# Patient Record
Sex: Male | Born: 1955
Health system: Southern US, Community
[De-identification: ages and names within clinical notes are randomized; demographics above are authoritative.]

## PROBLEM LIST (undated history)

## (undated) DIAGNOSIS — K922 Gastrointestinal hemorrhage, unspecified: Secondary | ICD-10-CM

## (undated) DIAGNOSIS — I2510 Atherosclerotic heart disease of native coronary artery without angina pectoris: Secondary | ICD-10-CM

## (undated) DIAGNOSIS — K635 Polyp of colon: Secondary | ICD-10-CM

## (undated) DIAGNOSIS — F32A Depression, unspecified: Secondary | ICD-10-CM

## (undated) DIAGNOSIS — R0600 Dyspnea, unspecified: Secondary | ICD-10-CM

## (undated) DIAGNOSIS — E785 Hyperlipidemia, unspecified: Secondary | ICD-10-CM

## (undated) DIAGNOSIS — I442 Atrioventricular block, complete: Secondary | ICD-10-CM

## (undated) DIAGNOSIS — M5030 Other cervical disc degeneration, unspecified cervical region: Secondary | ICD-10-CM

## (undated) DIAGNOSIS — J189 Pneumonia, unspecified organism: Secondary | ICD-10-CM

## (undated) DIAGNOSIS — T8859XA Other complications of anesthesia, initial encounter: Secondary | ICD-10-CM

## (undated) DIAGNOSIS — Z950 Presence of cardiac pacemaker: Secondary | ICD-10-CM

## (undated) DIAGNOSIS — F419 Anxiety disorder, unspecified: Secondary | ICD-10-CM

## (undated) DIAGNOSIS — M1990 Unspecified osteoarthritis, unspecified site: Secondary | ICD-10-CM

## (undated) DIAGNOSIS — I10 Essential (primary) hypertension: Secondary | ICD-10-CM

## (undated) DIAGNOSIS — K219 Gastro-esophageal reflux disease without esophagitis: Secondary | ICD-10-CM

## (undated) DIAGNOSIS — N419 Inflammatory disease of prostate, unspecified: Secondary | ICD-10-CM

## (undated) DIAGNOSIS — R011 Cardiac murmur, unspecified: Secondary | ICD-10-CM

## (undated) DIAGNOSIS — J302 Other seasonal allergic rhinitis: Secondary | ICD-10-CM

## (undated) DIAGNOSIS — R002 Palpitations: Secondary | ICD-10-CM

## (undated) DIAGNOSIS — E119 Type 2 diabetes mellitus without complications: Secondary | ICD-10-CM

## (undated) DIAGNOSIS — F988 Other specified behavioral and emotional disorders with onset usually occurring in childhood and adolescence: Secondary | ICD-10-CM

## (undated) DIAGNOSIS — T7840XA Allergy, unspecified, initial encounter: Secondary | ICD-10-CM

## (undated) DIAGNOSIS — I209 Angina pectoris, unspecified: Secondary | ICD-10-CM

## (undated) DIAGNOSIS — G4730 Sleep apnea, unspecified: Secondary | ICD-10-CM

## (undated) HISTORY — DX: Unspecified osteoarthritis, unspecified site: M19.90

## (undated) HISTORY — PX: HERNIA REPAIR: SHX51

## (undated) HISTORY — DX: Other cervical disc degeneration, unspecified cervical region: M50.30

## (undated) HISTORY — DX: Inflammatory disease of prostate, unspecified: N41.9

## (undated) HISTORY — PX: SPINE SURGERY: SHX786

## (undated) HISTORY — DX: Palpitations: R00.2

## (undated) HISTORY — DX: Polyp of colon: K63.5

## (undated) HISTORY — DX: Anxiety disorder, unspecified: F41.9

## (undated) HISTORY — PX: UPPER GASTROINTESTINAL ENDOSCOPY: SHX188

## (undated) HISTORY — DX: Presence of cardiac pacemaker: Z95.0

## (undated) HISTORY — DX: Sleep apnea, unspecified: G47.30

## (undated) HISTORY — PX: POLYPECTOMY: SHX149

## (undated) HISTORY — DX: Hyperlipidemia, unspecified: E78.5

## (undated) HISTORY — DX: Depression, unspecified: F32.A

## (undated) HISTORY — DX: Other specified behavioral and emotional disorders with onset usually occurring in childhood and adolescence: F98.8

## (undated) HISTORY — DX: Cardiac murmur, unspecified: R01.1

## (undated) HISTORY — DX: Gastro-esophageal reflux disease without esophagitis: K21.9

## (undated) HISTORY — PX: KNEE SURGERY: SHX244

## (undated) HISTORY — DX: Allergy, unspecified, initial encounter: T78.40XA

## (undated) HISTORY — PX: COLONOSCOPY: SHX174

## (undated) HISTORY — PX: BACK SURGERY: SHX140

## (undated) HISTORY — PX: ESOPHAGOGASTRODUODENOSCOPY: SHX1529

## (undated) HISTORY — DX: Type 2 diabetes mellitus without complications: E11.9

## (undated) HISTORY — PX: ANTERIOR CERVICAL DECOMP/DISCECTOMY FUSION: SHX1161

## (undated) HISTORY — DX: Atrioventricular block, complete: I44.2

## (undated) HISTORY — DX: Pneumonia, unspecified organism: J18.9

## (undated) HISTORY — DX: Other seasonal allergic rhinitis: J30.2

---

## 2000-04-17 HISTORY — PX: ANTERIOR CERVICAL DECOMP/DISCECTOMY FUSION: SHX1161

## 2000-07-10 ENCOUNTER — Encounter: Payer: Self-pay | Admitting: Neurosurgery

## 2000-07-12 ENCOUNTER — Ambulatory Visit (HOSPITAL_COMMUNITY): Admission: RE | Admit: 2000-07-12 | Discharge: 2000-07-13 | Payer: Self-pay | Admitting: Neurosurgery

## 2000-07-12 ENCOUNTER — Encounter: Payer: Self-pay | Admitting: Neurosurgery

## 2000-09-12 ENCOUNTER — Ambulatory Visit (HOSPITAL_COMMUNITY): Admission: RE | Admit: 2000-09-12 | Discharge: 2000-09-12 | Payer: Self-pay | Admitting: Neurosurgery

## 2000-09-12 ENCOUNTER — Encounter: Payer: Self-pay | Admitting: Neurosurgery

## 2001-01-02 ENCOUNTER — Encounter: Payer: Self-pay | Admitting: Neurosurgery

## 2001-01-02 ENCOUNTER — Ambulatory Visit (HOSPITAL_COMMUNITY): Admission: RE | Admit: 2001-01-02 | Discharge: 2001-01-02 | Payer: Self-pay | Admitting: Neurosurgery

## 2001-04-17 DIAGNOSIS — R002 Palpitations: Secondary | ICD-10-CM

## 2001-04-17 HISTORY — DX: Palpitations: R00.2

## 2001-05-14 ENCOUNTER — Ambulatory Visit (HOSPITAL_COMMUNITY): Admission: RE | Admit: 2001-05-14 | Discharge: 2001-05-14 | Payer: Self-pay | Admitting: Family Medicine

## 2001-11-08 ENCOUNTER — Ambulatory Visit (HOSPITAL_COMMUNITY): Admission: RE | Admit: 2001-11-08 | Discharge: 2001-11-08 | Payer: Self-pay | Admitting: Family Medicine

## 2001-11-08 ENCOUNTER — Encounter: Payer: Self-pay | Admitting: Family Medicine

## 2002-11-12 ENCOUNTER — Ambulatory Visit (HOSPITAL_COMMUNITY): Admission: RE | Admit: 2002-11-12 | Discharge: 2002-11-12 | Payer: Self-pay | Admitting: Family Medicine

## 2002-11-12 ENCOUNTER — Encounter: Payer: Self-pay | Admitting: Family Medicine

## 2003-04-17 ENCOUNTER — Emergency Department (HOSPITAL_COMMUNITY): Admission: AD | Admit: 2003-04-17 | Discharge: 2003-04-17 | Payer: Self-pay

## 2003-06-22 ENCOUNTER — Ambulatory Visit (HOSPITAL_COMMUNITY): Admission: RE | Admit: 2003-06-22 | Discharge: 2003-06-22 | Payer: Self-pay | Admitting: Family Medicine

## 2003-11-16 ENCOUNTER — Encounter (HOSPITAL_COMMUNITY): Admission: RE | Admit: 2003-11-16 | Discharge: 2003-11-17 | Payer: Self-pay | Admitting: Family Medicine

## 2005-06-30 ENCOUNTER — Ambulatory Visit (HOSPITAL_COMMUNITY): Admission: RE | Admit: 2005-06-30 | Discharge: 2005-06-30 | Payer: Self-pay | Admitting: Family Medicine

## 2005-07-10 ENCOUNTER — Ambulatory Visit: Payer: Self-pay | Admitting: Orthopedic Surgery

## 2005-07-13 ENCOUNTER — Ambulatory Visit (HOSPITAL_COMMUNITY): Admission: RE | Admit: 2005-07-13 | Discharge: 2005-07-13 | Payer: Self-pay | Admitting: Orthopedic Surgery

## 2005-08-16 ENCOUNTER — Ambulatory Visit: Payer: Self-pay | Admitting: Orthopedic Surgery

## 2006-02-15 ENCOUNTER — Ambulatory Visit (HOSPITAL_COMMUNITY): Admission: RE | Admit: 2006-02-15 | Discharge: 2006-02-15 | Payer: Self-pay | Admitting: Family Medicine

## 2006-03-13 ENCOUNTER — Ambulatory Visit: Payer: Self-pay | Admitting: Gastroenterology

## 2006-11-04 ENCOUNTER — Emergency Department (HOSPITAL_COMMUNITY): Admission: EM | Admit: 2006-11-04 | Discharge: 2006-11-04 | Payer: Self-pay | Admitting: Emergency Medicine

## 2006-11-07 ENCOUNTER — Ambulatory Visit: Payer: Self-pay | Admitting: Cardiovascular Disease

## 2006-11-09 ENCOUNTER — Ambulatory Visit: Payer: Self-pay | Admitting: Internal Medicine

## 2006-11-09 ENCOUNTER — Encounter (HOSPITAL_COMMUNITY): Admission: RE | Admit: 2006-11-09 | Discharge: 2006-12-09 | Payer: Self-pay | Admitting: Cardiovascular Disease

## 2006-12-26 ENCOUNTER — Ambulatory Visit: Payer: Self-pay | Admitting: Cardiovascular Disease

## 2007-02-27 ENCOUNTER — Ambulatory Visit: Payer: Self-pay | Admitting: Cardiovascular Disease

## 2007-03-12 ENCOUNTER — Ambulatory Visit (HOSPITAL_COMMUNITY): Admission: RE | Admit: 2007-03-12 | Discharge: 2007-03-12 | Payer: Self-pay | Admitting: Family Medicine

## 2008-09-24 ENCOUNTER — Ambulatory Visit (HOSPITAL_COMMUNITY): Admission: RE | Admit: 2008-09-24 | Discharge: 2008-09-24 | Payer: Self-pay | Admitting: Family Medicine

## 2008-11-08 ENCOUNTER — Encounter: Payer: Self-pay | Admitting: Internal Medicine

## 2008-11-24 ENCOUNTER — Encounter (INDEPENDENT_AMBULATORY_CARE_PROVIDER_SITE_OTHER): Payer: Self-pay | Admitting: *Deleted

## 2009-04-17 DIAGNOSIS — K635 Polyp of colon: Secondary | ICD-10-CM

## 2009-04-17 HISTORY — PX: COLONOSCOPY W/ POLYPECTOMY: SHX1380

## 2009-04-17 HISTORY — DX: Polyp of colon: K63.5

## 2009-05-10 ENCOUNTER — Telehealth: Payer: Self-pay | Admitting: Gastroenterology

## 2009-05-31 ENCOUNTER — Encounter (INDEPENDENT_AMBULATORY_CARE_PROVIDER_SITE_OTHER): Payer: Self-pay | Admitting: *Deleted

## 2009-06-30 ENCOUNTER — Encounter (INDEPENDENT_AMBULATORY_CARE_PROVIDER_SITE_OTHER): Payer: Self-pay | Admitting: *Deleted

## 2009-07-01 ENCOUNTER — Ambulatory Visit: Payer: Self-pay | Admitting: Gastroenterology

## 2009-07-13 ENCOUNTER — Telehealth (INDEPENDENT_AMBULATORY_CARE_PROVIDER_SITE_OTHER): Payer: Self-pay | Admitting: *Deleted

## 2009-07-15 ENCOUNTER — Ambulatory Visit: Payer: Self-pay | Admitting: Gastroenterology

## 2009-07-16 ENCOUNTER — Encounter: Payer: Self-pay | Admitting: Gastroenterology

## 2010-05-07 ENCOUNTER — Encounter: Payer: Self-pay | Admitting: Family Medicine

## 2010-05-08 ENCOUNTER — Encounter: Payer: Self-pay | Admitting: Family Medicine

## 2010-05-17 NOTE — Letter (Signed)
Summary: Previsit letter  [facility]  [address] [place], [place] [address]   Phone: [phone]  Fax: [phone]       05/31/2009 MRN: [id number]  [name] [address] [place], [place]  [address]  Dear Mr. SCHNORR,  Welcome to the Gastroenterology Division at [facility].    You are scheduled to see a nurse for your pre-procedure visit on 07-01-09 at 2:00p.m. on the 3rd floor at [facility], [address]  We ask that you try to arrive at our office 15 minutes prior to your appointment time to allow for check-in.  Your nurse visit will consist of discussing your medical and surgical history, your immediate family medical history, and your medications.    Please bring a complete list of all your medications or, if you prefer, bring the medication bottles and we will list them.  We will need to be aware of both prescribed and over the counter drugs.  We will need to know exact dosage information as well.  If you are on blood thinners (Coumadin, Plavix, Aggrenox, Ticlid, etc.) please call our office today/prior to your appointment, as we need to consult with your physician about holding your medication.   Please be prepared to read and sign documents such as consent forms, a financial agreement, and acknowledgement forms.  If necessary, and with your consent, a friend or relative is welcome to sit-in on the nurse visit with you.  Please bring your insurance card so that we may make a copy of it.  If your insurance requires a referral to see a specialist, please bring your referral form from your primary care physician.  No co-pay is required for this nurse visit.     If you cannot keep your appointment, please call [phone] to cancel or reschedule prior to your appointment date.  This allows [place] the opportunity to schedule an appointment for another patient in need of care.    Thank you for choosing [facility] for your medical needs.   We appreciate the opportunity to care for you.  Please visit [place] at our website  to learn more about our practice.                     Sincerely.                                                                                                                   The Gastroenterology Division

## 2010-05-17 NOTE — Letter (Signed)
Summary: Recall Colonoscopy Letter  [facility]  [address] [place], [place] [address]   Phone: [phone]  Fax: [phone]      November 24, 2008 MRN: [id number]   [name] [address] [facility] [place], [place]  [address]   Dear [name],   According to your medical record, it is time for you to schedule a Colonoscopy. The American Cancer Society recommends this procedure as a method to detect early colon cancer. Patients with a family history of colon cancer, or a personal history of colon polyps or inflammatory bowel disease are at increased risk.  This letter has beeen generated based on the recommendations made at the time of your procedure. If you feel that in your particular situation this may no longer apply, please contact our office.  Please call our office at [phone] to schedule this appointment or to update your records at your earliest convenience.  Thank you for cooperating with [place] to provide you with the very best care possible.   Sincerely,  Judie Petit T. Pleas Koch, M.D.  [facility] [phone]

## 2010-05-17 NOTE — Letter (Signed)
Summary: [facility] Instructions  [facility]  [address] [place], [place] [address]   Phone: [phone]  Fax: [phone]       [name]    [birth year]    MRN: [id number]        Procedure Day /Date:  Thursday 07/15/2009     Arrival Time: 9:00 am      Procedure Time: 10:00 am     Location of Procedure:                    _ x_   [facility]oscopy Center (4th Floor)                        PREPARATION FOR COLONOSCOPY WITH MOVIPREP   Starting 5 days prior to your procedure Saturday 3/26  do not eat nuts, seeds, popcorn, corn, beans, peas,  salads, or any raw vegetables.  Do not take any fiber supplements (e.g. Metamucil, Citrucel, and Benefiber).  THE DAY BEFORE YOUR PROCEDURE         DATE: Wednesday 3/30  1.  Drink clear liquids the entire day-NO SOLID FOOD  2.  Do not drink anything colored red or purple.  Avoid juices with pulp.  No orange juice.  3.  Drink at least 64 oz. (8 glasses) of fluid/clear liquids during the day to prevent dehydration and help the prep work efficiently.  CLEAR LIQUIDS INCLUDE: Water Jello Ice Popsicles Tea (sugar ok, no milk/cream) Powdered fruit flavored drinks Coffee (sugar ok, no milk/cream) Gatorade Juice: apple, white grape, white cranberry  Lemonade Clear bullion, consomm, broth Carbonated beverages (any kind) Strained chicken noodle soup Hard Candy                             4.  In the morning, mix first dose of MoviPrep solution:    Empty 1 Pouch A and 1 Pouch B into the disposable container    Add lukewarm drinking water to the top line of the container. Mix to dissolve    Refrigerate (mixed solution should be used within 24 hrs)  5.  Begin drinking the prep at 5:00 p.m. The MoviPrep container is divided by 4 marks.   Every 15 minutes drink the solution down to the next mark (approximately 8 oz) until the full liter is complete.   6.  Follow completed prep with 16 oz of clear liquid of your choice  (Nothing red or purple).  Continue to drink clear liquids until bedtime.  7.  Before going to bed, mix second dose of MoviPrep solution:    Empty 1 Pouch A and 1 Pouch B into the disposable container    Add lukewarm drinking water to the top line of the container. Mix to dissolve    Refrigerate  THE DAY OF YOUR PROCEDURE      DATE: Thursday 3/31  Beginning at 5:00 a.m. (5 hours before procedure):         1. Every 15 minutes, drink the solution down to the next mark (approx 8 oz) until the full liter is complete.  2. Follow completed prep with 16 oz. of clear liquid of your choice.    3. You may drink clear liquids until 8:00 am (2 HOURS BEFORE PROCEDURE).   MEDICATION INSTRUCTIONS  Unless otherwise instructed, you should take regular prescription medications with a small sip of water   as early as possible the morning  of your procedure.    Additional medication instructions: Do not take Indapamide day of procedure.         OTHER INSTRUCTIONS  You will need a responsible adult at least 56 years of age to accompany you and drive you home.   This person must remain in the waiting room during your procedure.  Wear loose fitting clothing that is easily removed.  Leave jewelry and other valuables at home.  However, you may wish to bring a book to read or  an iPod/MP3 player to listen to music as you wait for your procedure to start.  Remove all body piercing jewelry and leave at home.  Total time from sign-in until discharge is approximately 2-3 hours.  You should go home directly after your procedure and rest.  You can resume normal activities the  day after your procedure.  The day of your procedure you should not:   Drive   Make legal decisions   Operate machinery   Drink alcohol   Return to work  You will receive specific instructions about eating, activities and medications before you leave.    The above instructions have been reviewed and explained  to me by   Ezra Sites RN  July 01, 2009 2:20 PM   I fully understand and can verbalize these instructions _____________________________ Date _________

## 2010-05-17 NOTE — Letter (Signed)
Summary: Patient Notice- Polyp Results  [facility]  [address] [place], [place] [address]   Phone: [phone]  Fax: [phone]        July 16, 2009 MRN: [id number]    SUFIAN RAVI [address] [facility] [place], [place]  [address]    Dear Mr. MESTER,  I am pleased to inform you that the colon polyp(s) removed during your recent colonoscopy was (were) found to be benign (no cancer detected) upon pathologic examination.  I recommend you have a repeat colonoscopy examination in 5 years to look for recurrent polyps, as having colon polyps increases your risk for having recurrent polyps or even colon cancer in the future.  Should you develop new or worsening symptoms of abdominal pain, bowel habit changes or bleeding from the rectum or bowels, please schedule an evaluation with either your primary care physician or with me.  Continue treatment plan as outlined the day of your exam.  Please call us if you are having persistent problems or have questions about your condition that have not been fully answered at this time.  Sincerely,  Meryl Dare MD [facility]  This letter has been electronically signed by your physician.  Appended Document: Patient Notice- Polyp Results letter mailed 4.7.11

## 2010-05-17 NOTE — Procedures (Signed)
Summary: Colonoscopy  Patient: [name] Note: All result statuses are Final unless otherwise noted.  Tests: (1) Colonoscopy (COL)   COL Colonoscopy           DONE      [facility]oscopy Center     [address]     [place], [place]  [address]           COLONOSCOPY PROCEDURE REPORT           PATIENT:  [name], [name]  MR#:  [id number]     BIRTHDATE:  01/04/1956, 53 yrs. old  GENDER:  male           ENDOSCOPIST:  Judie Petit T. Russella Dar, MD, [facility]           PROCEDURE DATE:  07/15/2009     PROCEDURE:  Colonoscopy with biopsy     ASA CLASS:  Class II     INDICATIONS:  1) Routine Risk Screening           MEDICATIONS:   Fentanyl 100 mcg IV, Versed 10 mg IV           DESCRIPTION OF PROCEDURE:   After the risks benefits and     alternatives of the procedure were thoroughly explained, informed     consent was obtained.  Digital rectal exam was performed and     revealed no abnormalities.   The LB PCF-[id number] endoscope     was introduced through the anus and advanced to the cecum, which     was identified by both the appendix and ileocecal valve, without     limitations.  The quality of the prep was excellent, using     MoviPrep.  The instrument was then slowly withdrawn as the colon     was fully examined.     <<PROCEDUREIMAGES>>           FINDINGS:  A sessile polyp was found in the sigmoid colon. It was     3 mm in size. The polyp was removed using cold biopsy forceps.  A     sessile polyp was found in the rectum. It was 3 mm in size. The     polyp was removed using cold biopsy forceps.  A normal appearing     cecum, ileocecal valve, and appendiceal orifice were identified.     The ascending, hepatic flexure, transverse, splenic flexure,     descending appeared unremarkable. Retroflexed views in the rectum     revealed no abnormalities.  The time to cecum =  2.5  minutes. The     scope was then withdrawn (time =  12  min) from the patient and     the procedure completed.           COMPLICATIONS:  None           ENDOSCOPIC IMPRESSION:     1) 3 mm sessile polyp in the sigmoid colon     2) 3 mm sessile polyp in the rectum           RECOMMENDATIONS:     1) Await pathology results     2) If the polyps removed today are adenomatous (pre-cancerous),     you will need a repeat colonoscopy in 5 years. Otherwise you     should continue to follow colorectal cancer screening guidelines     for "routine risk" patients with colonoscopy in 10 years.     [name]. Russella Dar, MD, [facility]  CC: Lilyan Punt, MD           n.     [name] at 07/15/2009 10:37 AM           Aries, [name], [phone]  Note: An exclamation mark (!) indicates a result that was not dispersed into the flowsheet. Document Creation Date: 07/15/2009 10:38 AM _______________________________________________________________________  (1) Order result status: Final Collection or observation date-time: 07/15/2009 10:34 Requested date-time:  Receipt date-time:  Reported date-time:  Referring Physician:   Ordering Physician: Claudette Head [phone]) Specimen Source:  Source: Launa Grill Order Number: [id number] Lab site:   Appended Document: Colonoscopy     Procedures Next Due Date:    Colonoscopy: 06/2014

## 2010-05-17 NOTE — Miscellaneous (Signed)
Summary: [facility]  Clinical Lists Changes  Medications: Added new medication of MOVIPREP 100 GM  SOLR (PEG-KCL-NACL-NASULF-NA ASC-C) As per prep instructions. - Signed Rx of MOVIPREP 100 GM  SOLR (PEG-KCL-NACL-NASULF-NA ASC-C) As per prep instructions.;  #1 x 0;  Signed;  Entered by: Ezra Sites RN;  Authorized by: Meryl Dare MD [facility];  Method used: Electronically to [facility], [place], [place], [place]  [address], Ph: [phone], Fax: [phone] Allergies: Added new allergy or adverse reaction of * LATEX Observations: Added new observation of NKA: F (07/01/2009 13:51)    Prescriptions: MOVIPREP 100 GM  SOLR (PEG-KCL-NACL-NASULF-NA ASC-C) As per prep instructions.  #1 x 0   Entered by:   Ezra Sites RN   Authorized by:   Meryl Dare MD [facility]   Signed by:   Ezra Sites RN on 07/01/2009   Method used:   Electronically to        [facility] (retail)       [address]       [place], [place]  [address]       Ph: [phone]       Fax: [phone]   RxID:   [id number]

## 2010-05-17 NOTE — Progress Notes (Signed)
Summary: Schedule Colonoscopy  Phone Note Outgoing Call   Call placed by: Hortense Ramal CMA Duncan Dull),  May 10, 2009 12:25 PM Call placed to: Patient Summary of Call: I have called to advise patient that he is due for his recall colonoscopy. He states that he wants to set up an appointment to talk to Dr Lorin Picket first and then he will call back to set the test up if he chooses. Initial call taken by: Hortense Ramal CMA Duncan Dull),  May 10, 2009 12:26 PM

## 2010-05-17 NOTE — Progress Notes (Signed)
Summary: prep ?'s  Phone Note Call from Patient Call back at [phone]  (cell)   Caller: Patient Call For: Dr. Russella Dar Reason for Call: Talk to Nurse Summary of Call: pt has prep ?'s Initial call taken by: Vallarie Mare,  July 13, 2009 2:04 PM  Follow-up for Phone Call        pt forgot to stop eating salads 5 days before procedure; wants to know if it is okay.  Procedure scheduled for 4/1.  Instructed pt to stop eating salads from today until procedure. Follow-up by: Ezra Sites RN,  July 13, 2009 3:00 PM

## 2010-08-25 ENCOUNTER — Ambulatory Visit (HOSPITAL_COMMUNITY)
Admission: RE | Admit: 2010-08-25 | Discharge: 2010-08-25 | Disposition: A | Discharge status: Home / Self Care | Payer: BC Managed Care – PPO | Source: Ambulatory Visit | Attending: Family Medicine | Admitting: Family Medicine

## 2010-08-25 ENCOUNTER — Other Ambulatory Visit: Payer: Self-pay | Admitting: Family Medicine

## 2010-08-25 DIAGNOSIS — I10 Essential (primary) hypertension: Secondary | ICD-10-CM | POA: Insufficient documentation

## 2010-08-25 DIAGNOSIS — R059 Cough, unspecified: Secondary | ICD-10-CM

## 2010-08-25 DIAGNOSIS — Z87891 Personal history of nicotine dependence: Secondary | ICD-10-CM | POA: Insufficient documentation

## 2010-08-25 DIAGNOSIS — R05 Cough: Secondary | ICD-10-CM

## 2010-08-30 NOTE — Assessment & Plan Note (Signed)
[facility]                       [place] [facility] OFFICE NOTE   MAXIMUS, HOFFERT                     MRN:          [id number]  DATE:02/27/2007                            DOB:          10/29/1955    Rigley returns today for followup. He has adult attention-deficit  disorder on Adderall with chronic palpitations. This seem to be improved  off of Lopressor and Norvasc per patient. I started him on lisinopril  for hypertension last time. This seems improved. He has also lost a bit  of weight. He has palpitations chronic and stable. They are somewhat  more intense at night.   He has had PACs and PVCs but no supraventricular arrhythmias or atrial  fibrillation.    Meds:  List reviewed and accurate.   His review of systems is otherwise negative.   His current weight is 224. Blood pressure is 140/80. Pulse 80 and  regular. Afebrile. Respiratory rate 14.  HEENT:  Unremarkable.  Carotids normal without bruits. No lymphadenopathy, thyromegaly or JVP  elevation.  LUNGS:  Clear. Good diaphragmatic motion. No wheezing.  S1/S2 with a S4 gallop. PMI is normal.  ABDOMEN:  Is benign. Bowel sounds positive. No AAA. No bruit. No  hepatosplenomegaly. No hepatojugular reflux.  Distal pulses are intact. No edema.  NEUROLOGICAL:  Nonfocal.  SKIN:  Warm and dry.  No muscular weakness.   IMPRESSION:  1. Palpitations, benign, likely related to hyperactivity and Adderall.      The patient does not want to try less stimulating ADD medications.      He will continue his current dose of Adderall and will give an      event monitor should his symptoms worsen.  2. Hypertension. Currently better controlled. Will continue low-salt      diet and weight loss. Continue lisinopril 20 a day.  3. Overall, I think Alice is doing well. I will see him back in six      months. He will call me if he thinks that his palpitations or blood      pressure are worse.     Noralyn Pick. Eden Emms, MD, [facility]  Electronically Signed    PCN/MedQ  DD: 02/27/2007  DT: 02/28/2007  Job #: [id number]

## 2010-08-30 NOTE — Assessment & Plan Note (Signed)
[facility]                       [place] CARDIOLOGY OFFICE NOTE   [name], [name]                     MRN:          [id number]  DATE:11/07/2006                            DOB:          1955/09/11    Mr. Servantes is an extremely interesting patient originally from [place].  He  is referred for fatigue, shortness of breath and palpitations.  The  patient has had the adult diagnosis of attention deficit disorder.  He  has been on Adderall for 5 years.  His initial dose was 60 mg b.i.d.  but he ground his teeth so badly it was reduced to 30. He has been  treated for high blood pressure.  He was seen in the ER recently for  chest pain and palpitations.  He apparently did not have any significant  arrhythmias and was discharged at the time.  He had, for some reason,  stopped his metoprolol.  They thought this may have been related to it.  His lab work was unremarkable including a normal TSH  and a T4 and  normal CPKs.  I reviewed all of his emergency records.  The patient was  subsequently referred here.  The patient feels like he has absolutely no  energy.  He feels fatigue and shortness of breath.  He is normally not  that physically active but he works 12 to 14 hours a day and has 3  children, including a 43 year old and he feels that he is just having a  hard time keeping up.  He said he has been compliant with his metoprolol  since his emergency room visit.   The patient has had a Holter monitor looked at by Dr. Dietrich Pates and it  had showed only PACs and PVCs with no malignant arrhythmias.   He has not had a recent echo or stress test.   The patient's review of systems otherwise is negative.   His past medical history incudes attention deficit disorder,  hypertension, prostatitis, questionable sleep apnea, internal  hemorrhoids.   There is a previous history of a right bundle branch block by EKG as far  back as 1991.   He has no known  allergies.   Family History non-contributory   CURRENT MEDICATIONS:  Include:  1. Adderall 30 b.i.d.  2. Amlodipine 10 a day.  3. Metoprolol 50 daily.   He is happily married.  He has been married twice.  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  He has 3 children and  seems fairly happy.  His kids are 7, 16 and 19.  As indicated before, he  is originally from [place]. He enjoys golfing but is fairly sedentary.  He  denies any current use of drugs. He obviously has done some in the 70s.  He does drink whiskey on occasion.   He recently has been taking some Ambien to sleep at night.   His exam is remarkable for a healthy appearing middle-aged white male.  His affect is somewhat agitated, everything seems rushed.  He is afebrile.  His weight is 224, blood pressure 150/80, pulse is 81  and regular.  He is not having PACs or PVCs at this time.  Respiratory  rate is 14.  HEENT:  Normal.  There is no thyromegaly, no lymphadenopathy, no JVP elevation.  LUNGS:  Clear with diaphragmatic motion, no wheezing.  There is an S1, S2 with normal heart sounds.  PMI is normal.  ABDOMEN:  Benign. There are no renal bruits.  No AAA, no tenderness.  There is no hepatosplenomegaly, no hepatojugular reflux.  Pulses are intact with no edema.  Femorals and PTs are +3 with no bruit.  NEURO:  Nonfocal.  There is no muscular weakness.   His EKG showed sinus rhythm with a right bundle branch block.  There are  insignificant Qs in III and aVF.   IMPRESSION:  1. Palpitations.  I am not sure why his heart would be acting up at      this time.  I suspect they are benign.  Clearly Adderall is not an      ideal medicine for him to be on, but he has been on it for 5 years      and there is no reason to think that it should all of the sudden be      causing palpitations.  Certainly stopping his metoprolol might make      things worse.  He is back on it now.  He has had a monitor that      showed  benign premature atrial contractions and premature      ventricular contractions.  I think for the time being we will      continue his beta blocker therapy.  2. Fatigue, shortness of breath and atypical chest pain.  The patient      will have a follow up exercise stress Myoview.  I am particularly      anxious to see what his blood pressure response is and to see if he      has any arrhythmias.  Again, I suspect he will not have any      evidence of coronary artery disease.  3. Dyspnea and fatigue.  A follow up 2-D echocardiogram to assess      __________RV and left ventricular function.  Hopefully these will      be normal.  4. Right bundle branch block, chronic and no evidence for structural      heart disease on exam or otherwise.  5. Attention deficit disorder.  The patient may benefit from a      psychological referral.  He does seem rather energized and his      Adderall is not an ideal medication in regards to his palpitations.   We will make these further recommendations after we clear his heart.   I will see him back after his echo and stress test.     Theron Arista C. Eden Emms, MD, [facility]  Electronically Signed    PCN/MedQ  DD: 11/07/2006  DT: 11/08/2006  Job #: [id number]   cc:   Lorin Picket A. Gerda Diss, MD

## 2010-08-30 NOTE — Assessment & Plan Note (Signed)
[facility]                       [place] CARDIOLOGY OFFICE NOTE   [name], [name]                     MRN:          [id number]  DATE:12/26/2006                            DOB:          12-Apr-1956    [name] returns today for follow-up.  He has had frequent PACs and  PVCs and atypical chest pain.  His stress test was nonischemic.  He did  not have any malignant arrhythmias.  His echo showed good LV function  with no valvular heart disease.  On his own the patient stopped his  amlodipine.  He continued to have palpitations.  He then stopped his  metoprolol.  Off both medicines he says his palpitations are much  better.  He continues to take his Adderall for ADD.   I told Mr. Lobos that if he thinks that the amlodipine and metoprolol  exacerbated his PACs and PVCs, I am happy to stop them; however, he  clearly needs blood pressure medicine.  He has been high every time he  has been here to the clinic.  After discussing his options, we have  decided to start him on lisinopril 20 mg a day and see how he does both  in regard to his palpitations and blood pressure.   REVIEW OF SYSTEMS:  Remarkable for no significant chest pain any more.  His previous pain was atypical.  I tried to reassure him given his  normal Myoview.  He has not any syncope and has not had any exertional  dyspnea.  He was inquiring about an exercise program and we went over  this.  He does have a BowFlex-type machine at home and we outlined a  program for his use.  I told him I would see him back in 8 weeks to  reassess his blood pressure.  His review of systems is otherwise  negative   His only Medicaton currently is Adderall XR 30 mg a day and now  lisinopril 20 mg a day.  He takes a baby aspirin a day.   His exam is remarkable for a somewhat hyperactive middle-aged male in no  distress.  Weight is 226.  Blood pressure is 160/100, pulse is 80 and regular.  Respiratory rate is 14.  He is afebrile.  HEENT:  Normal.  Carotids normal without bruits.  No lymphadenopathy, no thyromegaly, no  JVP elevation.  Lungs are clear with diaphragmatic motion.  No wheezing.  There is an S1-S2 with normal heart sounds.  PMI is normal.  ABDOMEN:  Benign.  Bowel sounds positive.  No tenderness, no  hepatosplenomegaly, no hepatojugular reflux, no tenderness.  Femorals are +4 bilaterally without bruit.  PTs are +4.  There is no  lower extremity edema.  NEUROLOGIC:  Nonfocal.  There is no tremor from his Adderall.  There is  no muscular weakness.  SKIN:  Warm and dry.   His baseline EKG shows a right bundle branch block.   IMPRESSION:  1. PACs, PVCs, the patient thinks exacerbated by amlodipine and      metoprolol.  These will be stopped.  His stress test and echo would  suggest that this is a benign electrical phenomenon without      structural heart disease.  We will continue to monitor.  2. Hypertension, worse off medications.  The patient will be started      on lisinopril 20 mg a day.  I will see him back in 8 weeks to      reassess this.  He will continue a low-salt diet.  3. Adult attention deficit disorder.  Continue Adderall 30 mg a day.      The patient clearly states he does not think his palpitations have      been made worse by this.  In particular, when he went from 60 mg to      30, he did not feel any change in his palpitations.  He has to take      this medicine.  He has been on it for a long time.  I have urged      him to talk to his primary care doctor to see if there are any      other newer medicines out there for adult-onset ADD.  4. He continues to deny drug use.   I will see him back in 8 weeks to further assess his blood pressure.     Noralyn Pick. Eden Emms, MD, [facility]  Electronically Signed    PCN/MedQ  DD: 12/26/2006  DT: 12/27/2006  Job #: [id number]

## 2010-08-30 NOTE — Procedures (Signed)
NAMEJOSIMAR, CORNING NO.:  [id number]   MEDICAL RECORD NO.:  [id number]          PATIENT TYPE:  REC   LOCATION:  RAD                           FACILITY:  [facility]   PHYSICIAN:  Pricilla Riffle, MD, FACCDATE OF BIRTH:  [birth year]   DATE OF PROCEDURE:  11/09/2006  DATE OF DISCHARGE:                                ECHOCARDIOGRAM   TEST INDICATIONS:  This is a 55 year old with history of chest pressure,  shortness of breath, palpitations.   2-D ECHO WITH ECHO DOPPLER:  Left ventricle is normal in size with an  end-diastolic dimension of 43 mm.  The interventricular septum is mildly  thickened at 15 mm.  Posterior wall is mildly thickened at 13 mm.   Left atrium is grossly normal in size.  Right atrium, right ventricle  are normal.  Aortic root is normal at 39 mm.   The aortic valve is normal with no insufficiency.  Mitral valve is  minimally thickened with no insufficiency.  Tricuspid valve is normal  with no insufficiency.  Pulmonic valve is normal with no insufficiency.   There are frequent PVCs during the study.  Overall LV systolic function  appears to be normal with an LVEF of approximately 55 to 60%.  RVEF is  normal.   IVC is normal.   No pericardial effusion is seen.      Pricilla Riffle, MD, [facility]  Electronically Signed     PVR/MEDQ  D:  11/09/2006  T:  11/10/2006  Job:  [id number]   cc:   Lorin Picket A. Gerda Diss, MD  Fax: [phone]

## 2010-09-02 NOTE — Procedures (Signed)
NAMEMARCH, STEYER                          ACCOUNT NO.:  [id number]   MEDICAL RECORD NO.:  [id number]                  PATIENT TYPE:  PREC   LOCATION:                                       FACILITY:   PHYSICIAN:  Scott A. Gerda Diss, M.D.               DATE OF BIRTH:   DATE OF PROCEDURE:  11/16/2003  DATE OF DISCHARGE:                                    STRESS TEST   PROTOCOL:  Bruce protocol.   CARDIOLITE INDICATION:  Chest tightness   EKG resting shows a partial bundle branch block with no acute changes   Heart rate response to exercise:  The patient's heart rate gradually went up  as expected and reached a peak of 148   Reason for ending test:  Had met all goals and had Cardiolite injected at  peak heart rate sustained at 1 minute.   Symptomatology :  chest pain.   ST segment response to exercise:  The patient did have some __________  but  they were upsloping,  J point  was borderline. The patient did have one PVC.   Blood response to exercise:  The patient had significant hypertensive  response.   INTERPRETATION:  Abnormal EKG response with significant ST segment  depression V3 .   Await Cardiolite images. The patient to hold off on exercise routine .      ___________________________________________                                            Jonna Coup Gerda Diss, M.D.   Linus Orn  D:  11/16/2003  T:  11/16/2003  Job:  [id number]

## 2010-09-02 NOTE — Op Note (Signed)
[place]  Patient:    [name], [name]                       MRN: [id number] Proc. Date: 07/12/00 Adm. Date:  [id number] Attending:  Donalee Citrin P                           Operative Report  PREOPERATIVE DIAGNOSIS:  Cervical radiculopathy C6 and C7, left greater than right.  PROCEDURE:  Anterior cervical diskectomy and fusion at C5-6 and C6-7 with fibula allograft and Atlantis plate using 7 mm fibula allografts, 42.5 mm Atlantis plate with 13 mm screws.  SURGEON:  Donzetta Sprung. Roney Jaffe., MD  ASSISTANT:  Bradd Canary., M.D.  ANESTHESIA:  General endotracheal.  INTRAVENOUS FLUIDS:  1700.  BLOOD LOSS:  Less than 100.  INDICATIONS:  Patient is a very pleasant 55 year old gentleman.  He has had several months of progressive worsening of neck and left arm pain that radiates into his thumb and next three fingers.  He has also had weakness in his triceps and progressive worsening of neck pain.  He failed conservative treatment with anti-inflammatories and physical therapy and presents for diskectomies.  DESCRIPTION OF PROCEDURE:  Patient was brought in the OR, he was induced under general anesthesia and was positioned supine with his head in slight extension.  The right side of his neck was prepped and draped using sterile fashion.  Preoperative x-ray confirmed localization of probe in the C6 vertebral body.  A curvilinear incision was made just off the midline to the anterior border of the sternocleidomastoid.  Superficial layer of platysma was dissected out and the platysma was then divided longitudinally and the avascular plane was ______ sternocleidomastoid and the omohyoid was developed down to the prevertebral fascia.  Intraoperative x-ray confirmed the C5-6 interspace.  Prevertebral fascia was cleaned and dissected away with Kitners. Longus colli was reflected laterally with Bovie electrocautery and self-retaining retractor was  placed exposing both the 5-6 and 6-7 interspaces. Annulotomies were made with an 11-blade scalpel and pituitary rongeurs removed the anterior margin of the annulus and the Anspach [facility] Max drill bit with the ______  drill bit was used to drill off the remainder of the disk down to the posterior osteophyte at C5-6.  There was a large central subligamentous disk fragment that was teased out and the ligament was removed in a piecemeal fashion and radical decompression of both C6 nerve roots was achieved with one and 2 mm Kerrison punches and explored with angled nerve hook and noted to be under no further compression and then the end plates were evened off and Gelfoam was overlaid.  ______ 6-7 interspace, again the Anspach with ______ drill bit was used drill out down the posterior osteophyte.  There was a very large osteophyte coming off the C7 vertebral compressing the thecal sac and a left C7 nerve root.  This was removed in a piecemeal fashion with one and 2 mm Kerrison punch.  The thecal sac was then visualized and radical foraminotomies at C7 bilaterally were then undertaken under microscopic illumination and the remainder of the C7 nerve roots were decompressed and explored with nerve hook and noted to be no longer under compression.  The endplates were evened off a 7 mm fibular allograft was inserted and during insertion, it fractured and had to be removed and an additional 7 mm allograft was brought in the field.  The end plates were further drilled back to enlarge the interspace.  Then a 7 mm allograft was inserted after the interspace had been distracted.  Then, the 42.5 mm Atlantis plate was selected and sized up and drilled and tapped and 13 mm screws were inserted at all levels.  Then, the set screws were tightened. The wound was copiously irrigated, meticulous hemostasis was maintained.  The platysma was closed with 3-0 interrupted Vicryls.  The skin was closed with a running  4-0 subcuticular.  Benzoin and Steri-Strips were applied.   Patient went to recovery room in stable condition.  At the end of the case, all needle counts and sponge counts were correct. DD:  07/12/00 TD:  07/12/00 Job: [id number]

## 2010-09-02 NOTE — Assessment & Plan Note (Signed)
[facility]                         GASTROENTEROLOGY OFFICE NOTE   NAVI, ERBER                     MRN:          [id number]  DATE:03/13/2006                            DOB:          Aug 23, 1955    REFERRING PHYSICIAN:  Scott A. Gerda Diss, MD   REASON FOR REFERRAL:  1. GERD.  2. Dysphagia.  3. Constipation.   HISTORY OF PRESENT ILLNESS:  Mr. Bade is a 55 year old white male,  referred through the courtesy of Dr. Lilyan Punt.  He has a long  history of GERD with regurgitation symptoms and significant belching.  He relates intermittent problems with lower abdominal discomfort, gas  and bloating.  He does occasionally have constipation as well.  He  previously underwent colonoscopy in September of 2000, which showed  moderate-sized internal hemorrhoids.  He did have small volume  hematochezia at that time.  In addition he had problems with  intermittent diarrhea for many years.  He has dysphagia with pills and  certain solid foods that he localizes to his neck region and he has a  real difficult time swallowing almost any pill for many years.  He  relates midsternal chest pain.  He also has been diagnosed with sleep  apnea, but he does not appear to be on any specific treatment for that  at this time.  He relates no weight loss, melena, hematochezia or  odynophagia.   PAST MEDICAL HISTORY:  1. Hypertension.  2. Prostatitis.  3. Sleep apnea.  4. Internal hemorrhoids.  5. Status post neck surgery.   CURRENT MEDICATIONS:  Listed on the chart, updated and reviewed.   MEDICATION ALLERGIES:  None known.   SOCIAL HISTORY:  Per the new patient evaluation form.   REVIEW OF SYSTEMS:  Per the new patient evaluation form.   PHYSICAL EXAMINATION:  Somewhat anxious white male.  Weight 227.4  pounds, blood pressure is 160/100, pulse 90 and regular.  HEENT:  Anicteric sclerae.  Oropharynx is clear.  CHEST:  Clear to auscultation bilaterally.  CARDIAC:  He has regular rate and rhythm without murmurs appreciated.  ABDOMEN:  Soft, nontender, nondistended.  Normal active bowel sounds, no  palpable organomegaly, masses or hernias.  RECTAL:  Examination deferred to the time of his colonoscopy.  Recent  exam by Dr. Gerda Diss revealed an enlarged prostate without other lesions.  EXTREMITIES:  Without clubbing, cyanosis or edema.  NEUROLOGIC:  Alert and oriented x3.  Grossly nonfocal.   ASSESSMENT AND PLAN:  1. Gastroesophageal reflux disease with dysphagia symptoms.  From his      history, it appears that his dysphagia is oropharyngeal.  However,      esophageal disorders need to be further excluded.  He already has      an ENT appointment scheduled.  Risks, benefits and alternatives to      upper endoscopy with possible biopsy and dilation discussed with      the patient, and he consents to proceed.  This will be scheduled      electively.  He is advised to discontinue Alka-Seltzer usage and      use Maalox or  Mylanta p.r.n.  He is to begin standard antireflux      measures and Prevacid Solutabs 30 mg p.o. q.a.m.  2. Constipation and abdominal bloating.  Begin a high-fiber diet and      increase fluid intake.  Begin Benefiber or Citrucel clear on a      daily basis.  Risks, benefits and alternatives to colonoscopy with      possible biopsy and possible polypectomy discussed with the      patient, and he consents to proceed.  This will be scheduled      electively.  3. After ENT evaluation and upper endoscopy, may need to consider a      barium esophagram or a modified barium swallow if his dysphagia has      not been fully defined.     [name]. Russella Dar, MD, [facility]  Electronically Signed    MTS/MedQ  DD: 03/16/2006  DT: 03/18/2006  Job #: [id number]   cc:   Lorin Picket A. Gerda Diss, MD

## 2011-01-30 LAB — POCT CARDIAC MARKERS
CKMB, poc: 1.1
Myoglobin, poc: 77
Operator id: 207241
Troponin i, poc: <0.05

## 2011-01-30 LAB — CBC
HCT: 44.7
Hemoglobin: 15.4
MCHC: 34.4
MCV: 85.3
Platelets: 238
RBC: 5.24
RDW: 12.9
WBC: 7.3

## 2011-01-30 LAB — DIFFERENTIAL
Basophils Absolute: 0
Basophils Relative: 1
Eosinophils Absolute: 0.1
Eosinophils Relative: 2
Lymphocytes Relative: 30
Lymphs Abs: 2.2
Monocytes Absolute: 0.6
Monocytes Relative: 8
Neutro Abs: 4.3
Neutrophils Relative %: 59

## 2011-01-30 LAB — BASIC METABOLIC PANEL
BUN: 16
CO2: 29
Calcium: 9.2
Chloride: 104
Creatinine, Ser: 0.96
GFR calc Af Amer: >60
GFR calc non Af Amer: >60
Glucose, Bld: 107 — ABNORMAL HIGH
Potassium: 4.9
Sodium: 137

## 2011-08-16 ENCOUNTER — Encounter (HOSPITAL_COMMUNITY): Payer: Self-pay

## 2011-08-16 ENCOUNTER — Emergency Department (HOSPITAL_COMMUNITY): Payer: BC Managed Care – PPO

## 2011-08-16 ENCOUNTER — Emergency Department (HOSPITAL_COMMUNITY)
Admission: EM | Admit: 2011-08-16 | Discharge: 2011-08-16 | Disposition: A | Discharge status: Home / Self Care | Payer: BC Managed Care – PPO | Attending: Emergency Medicine | Admitting: Emergency Medicine

## 2011-08-16 DIAGNOSIS — F411 Generalized anxiety disorder: Secondary | ICD-10-CM | POA: Insufficient documentation

## 2011-08-16 DIAGNOSIS — Z79899 Other long term (current) drug therapy: Secondary | ICD-10-CM | POA: Insufficient documentation

## 2011-08-16 DIAGNOSIS — E876 Hypokalemia: Secondary | ICD-10-CM | POA: Insufficient documentation

## 2011-08-16 DIAGNOSIS — R002 Palpitations: Secondary | ICD-10-CM

## 2011-08-16 DIAGNOSIS — I10 Essential (primary) hypertension: Secondary | ICD-10-CM | POA: Insufficient documentation

## 2011-08-16 HISTORY — DX: Essential (primary) hypertension: I10

## 2011-08-16 LAB — POCT I-STAT, CHEM 8
BUN: 15 mg/dL (ref 6–23)
Calcium, Ion: 1.27 mmol/L (ref 1.12–1.32)
Chloride: 103 mEq/L (ref 96–112)
Creatinine, Ser: 1 mg/dL (ref 0.50–1.35)
Glucose, Bld: 98 mg/dL (ref 70–99)
HCT: 52 % (ref 39.0–52.0)
Hemoglobin: 17.7 g/dL — ABNORMAL HIGH (ref 13.0–17.0)
Potassium: 3.4 mEq/L — ABNORMAL LOW (ref 3.5–5.1)
Sodium: 141 mEq/L (ref 135–145)
TCO2: 28 mmol/L (ref 0–100)

## 2011-08-16 LAB — CBC
HCT: 49 % (ref 39.0–52.0)
Hemoglobin: 16.7 g/dL (ref 13.0–17.0)
MCH: 29.9 pg (ref 26.0–34.0)
MCHC: 34.1 g/dL (ref 30.0–36.0)
MCV: 87.7 fL (ref 78.0–100.0)
Platelets: 242 10*3/uL (ref 150–400)
RBC: 5.59 MIL/uL (ref 4.22–5.81)
RDW: 13.2 % (ref 11.5–15.5)
WBC: 10 10*3/uL (ref 4.0–10.5)

## 2011-08-16 LAB — POCT I-STAT TROPONIN I: Troponin i, poc: 0.01 ng/mL (ref 0.00–0.08)

## 2011-08-16 MED ORDER — POTASSIUM CHLORIDE CRYS ER 20 MEQ PO TBCR
20.0000 meq | EXTENDED_RELEASE_TABLET | Freq: Once | ORAL | Status: AC
  Administered 2011-08-16: 20 meq via ORAL
  Filled 2011-08-16: qty 1

## 2011-08-16 MED ORDER — SODIUM CHLORIDE 0.9 % IV SOLN
INTRAVENOUS | Status: DC
  Administered 2011-08-16: 01:00:00 via INTRAVENOUS

## 2011-08-16 NOTE — ED Provider Notes (Signed)
History     CSN: [id number]  Arrival date & time 08/16/11  0020   First MD Initiated Contact with Patient 08/16/11 0021      Chief Complaint  Patient presents with  . Palpitations  . Hypertension    (Consider location/radiation/quality/duration/timing/severity/associated sxs/prior treatment) HPI Patient presents with palpitations and increased blood pressure at home tonight. Previously prescribed blood pressure medications and he stopped taking them after he lost weight and felt like his blood pressures getting better. Is not followup with his doctor in the meantime. Her last few weeks to months has started to have increase in his weight again due to what he tells me is poor diet.  No chest pain or shortness of breath. Patient has been checking his blood pressure multiple times at home and noticed to getting higher and higher presents here for evaluation. He took 3 aspirin and one blood pressure tablet, 2.5 mg Lozol. Palpitations have improved. Patient admits to anxiety over his elevated blood pressure. The leg pain or swelling. Abdominal pain. No headaches. No unilateral weakness or numbness. No difficulty with speech or gait. Past Medical History  Diagnosis Date  . Hypertension     Past Surgical History  Procedure Date  . Neck surgery     No family history on file.  History  Substance Use Topics  . Smoking status: Former Games developer  . Smokeless tobacco: Not on file  . Alcohol Use: 0.6 oz/week    1 Shots of liquor per week      Review of Systems  Constitutional: Negative for fever and chills.  HENT: Negative for neck pain and neck stiffness.   Eyes: Negative for pain.  Respiratory: Negative for shortness of breath.   Cardiovascular: Positive for palpitations. Negative for chest pain and leg swelling.  Gastrointestinal: Negative for abdominal pain.  Genitourinary: Negative for dysuria.  Musculoskeletal: Negative for back pain.  Skin: Negative for rash.  Neurological:  Negative for headaches.  All other systems reviewed and are negative.    Allergies  Latex  Home Medications   Current Outpatient Rx  Name Route Sig Dispense Refill  . INDAPAMIDE 2.5 MG PO TABS Oral Take 2.5 mg by mouth every morning.      BP 164/87  Pulse 73  Temp(Src) 97.8 F (36.6 C) (Oral)  Resp 20  Ht 6' (1.829 m)  Wt 220 lb (99.791 kg)  BMI 29.84 kg/m2  SpO2 95%  Physical Exam  Constitutional: He is oriented to person, place, and time. He appears well-developed and well-nourished.  HENT:  Head: Normocephalic and atraumatic.  Eyes: Conjunctivae and EOM are normal. Pupils are equal, round, and reactive to light.  Neck: Trachea normal. Neck supple. No thyromegaly present.  Cardiovascular: Normal rate, regular rhythm, S1 normal, S2 normal and normal pulses.     No systolic murmur is present   No diastolic murmur is present  Pulses:      Radial pulses are 2+ on the right side, and 2+ on the left side.  Pulmonary/Chest: Effort normal and breath sounds normal. He has no wheezes. He has no rhonchi. He has no rales. He exhibits no tenderness.  Abdominal: Soft. Normal appearance and bowel sounds are normal. There is no tenderness. There is no CVA tenderness and negative Murphy's sign.  Musculoskeletal:       BLE:s Calves nontender, no cords or erythema, negative Homans sign  Neurological: He is alert and oriented to person, place, and time. He has normal strength. No cranial nerve deficit or  sensory deficit. GCS eye subscore is 4. GCS verbal subscore is 5. GCS motor subscore is 6.  Skin: Skin is warm and dry. No rash noted. He is not diaphoretic.  Psychiatric: His speech is normal.       Cooperative and appropriate    ED Course  Procedures (including critical care time)  Labs Reviewed  POCT I-STAT, CHEM 8 - Abnormal; Notable for the following:    Potassium 3.4 (*)    Hemoglobin 17.7 (*)    All other components within normal limits  CBC  POCT I-STAT TROPONIN I   Dg  Chest 2 View  08/16/2011  *RADIOLOGY REPORT*  Clinical Data: Chest tightness, palpitations  CHEST - 2 VIEW  Comparison: 08/25/2010; 06/22/2003  Findings: Grossly unchanged cardiac silhouette and mediastinal contours given decreased lung volumes.  Grossly unchanged minimal bibasilar linear heterogeneous opacities favored to represent subsegmental atelectasis.  No focal airspace opacities.  No pleural effusion or pneumothorax.  Grossly unchanged bones including lower cervical ACDF, incompletely imaged.  IMPRESSION: No acute cardiopulmonary disease.  Original Report Authenticated By: Waynard Reeds, M.D.     Date: 08/16/2011  Rate: 71  Rhythm: normal sinus rhythm  QRS Axis: normal  Intervals: normal  ST/T Wave abnormalities: nonspecific ST changes  Conduction Disutrbances:right bundle branch block  Narrative Interpretation:   Old EKG Reviewed: none available  IV fluids, labs, EKG. Blood pressure medication prior to arrival.  serial Evaluations with improving blood pressure   MDM    Hypertension improved with rest IV fluids. Labs, EKG and x-ray obtained and reviewed as above. Patient feels well and on recheck at 2:30 AM is requesting to be discharged home. He agrees to continue his blood pressure medications as prescribed and followup with his primary care physician Dr. Gerda Diss. Reliable historian verbalizes understanding of discharge and followup instructions in addition to strict return precautions for any worsening condition.        Sunnie Nielsen, MD 08/20/11 [phone]

## 2011-08-16 NOTE — Discharge Instructions (Signed)
Arterial Hypertension Arterial hypertension (high blood pressure) is a condition of elevated pressure in your blood vessels. Hypertension over a long period of time is a risk factor for strokes, heart attacks, and heart failure. It is also the leading cause of kidney (renal) failure.  CAUSES   In Adults -- Over 90% of all hypertension has no known cause. This is called essential or primary hypertension. In the other 10% of people with hypertension, the increase in blood pressure is caused by another disorder. This is called secondary hypertension. Important causes of secondary hypertension are:   Heavy alcohol use.   Obstructive sleep apnea.   Hyperaldosterosim (Conn's syndrome).   Steroid use.   Chronic kidney failure.   Hyperparathyroidism.   Medications.   Renal artery stenosis.   Pheochromocytoma.   Cushing's disease.   Coarctation of the aorta.   Scleroderma renal crisis.   Licorice (in excessive amounts).   Drugs (cocaine, methamphetamine).  Your caregiver can explain any items above that apply to you.  In Children -- Secondary hypertension is more common and should always be considered.   Pregnancy -- Few women of childbearing age have high blood pressure. However, up to 10% of them develop hypertension of pregnancy. Generally, this will not harm the woman. It Even be a sign of 3 complications of pregnancy: preeclampsia, HELLP syndrome, and eclampsia. Follow up and control with medication is necessary.  SYMPTOMS   This condition normally does not produce any noticeable symptoms. It is usually found during a routine exam.   Malignant hypertension is a late problem of high blood pressure. It Monger have the following symptoms:   Headaches.   Blurred vision.   End-organ damage (this means your kidneys, heart, lungs, and other organs are being damaged).   Stressful situations can increase the blood pressure. If a person with normal blood pressure has their blood  pressure go up while being seen by their caregiver, this is often termed "white coat hypertension." Its importance is not known. It Alkire be related with eventually developing hypertension or complications of hypertension.   Hypertension is often confused with mental tension, stress, and anxiety.  DIAGNOSIS  The diagnosis is made by 3 separate blood pressure measurements. They are taken at least 1 week apart from each other. If there is organ damage from hypertension, the diagnosis Lemire be made without repeat measurements. Hypertension is usually identified by having blood pressure readings:  Above 140/90 mmHg measured in both arms, at 3 separate times, over a couple weeks.   Over 130/80 mmHg should be considered a risk factor and Grisanti require treatment in patients with diabetes.  Blood pressure readings over 120/80 mmHg are called "pre-hypertension" even in non-diabetic patients. To get a true blood pressure measurement, use the following guidelines. Be aware of the factors that can alter blood pressure readings.  Take measurements at least 1 hour after caffeine.   Take measurements 30 minutes after smoking and without any stress. This is another reason to quit smoking - it raises your blood pressure.   Use a proper cuff size. Ask your caregiver if you are not sure about your cuff size.   Most home blood pressure cuffs are automatic. They will measure systolic and diastolic pressures. The systolic pressure is the pressure reading at the start of sounds. Diastolic pressure is the pressure at which the sounds disappear. If you are elderly, measure pressures in multiple postures. Try sitting, lying or standing.   Sit at rest for a minimum of   5 minutes before taking measurements.   You should not be on any medications like decongestants. These are found in many cold medications.   Record your blood pressure readings and review them with your caregiver.  If you have hypertension:  Your caregiver  may do tests to be sure you do not have secondary hypertension (see "causes" above).   Your caregiver may also look for signs of metabolic syndrome. This is also called Syndrome X or Insulin Resistance Syndrome. You may have this syndrome if you have type 2 diabetes, abdominal obesity, and abnormal blood lipids in addition to hypertension.   Your caregiver will take your medical and family history and perform a physical exam.   Diagnostic tests may include blood tests (for glucose, cholesterol, potassium, and kidney function), a urinalysis, or an EKG. Other tests may also be necessary depending on your condition.  PREVENTION  There are important lifestyle issues that you can adopt to reduce your chance of developing hypertension:  Maintain a normal weight.   Limit the amount of salt (sodium) in your diet.   Exercise often.   Limit alcohol intake.   Get enough potassium in your diet. Discuss specific advice with your caregiver.   Follow a DASH diet (dietary approaches to stop hypertension). This diet is rich in fruits, vegetables, and low-fat dairy products, and avoids certain fats.  PROGNOSIS  Essential hypertension cannot be cured. Lifestyle changes and medical treatment can lower blood pressure and reduce complications. The prognosis of secondary hypertension depends on the underlying cause. Many people whose hypertension is controlled with medicine or lifestyle changes can live a normal, healthy life.  RISKS AND COMPLICATIONS  While high blood pressure alone is not an illness, it often requires treatment due to its short- and long-term effects on many organs. Hypertension increases your risk for:  CVAs or strokes (cerebrovascular accident).   Heart failure due to chronically high blood pressure (hypertensive cardiomyopathy).   Heart attack (myocardial infarction).   Damage to the retina (hypertensive retinopathy).   Kidney failure (hypertensive nephropathy).  Your caregiver can  explain list items above that apply to you. Treatment of hypertension can significantly reduce the risk of complications. TREATMENT   For overweight patients, weight loss and regular exercise are recommended. Physical fitness lowers blood pressure.   Mild hypertension is usually treated with diet and exercise. A diet rich in fruits and vegetables, fat-free dairy products, and foods low in fat and salt (sodium) can help lower blood pressure. Decreasing salt intake decreases blood pressure in a 1/3 of people.   Stop smoking if you are a smoker.  The steps above are highly effective in reducing blood pressure. While these actions are easy to suggest, they are difficult to achieve. Most patients with moderate or severe hypertension end up requiring medications to bring their blood pressure down to a normal level. There are several classes of medications for treatment. Blood pressure pills (antihypertensives) will lower blood pressure by their different actions. Lowering the blood pressure by 10 mmHg may decrease the risk of complications by as much as 25%. The goal of treatment is effective blood pressure control. This will reduce your risk for complications. Your caregiver will help you determine the best treatment for you according to your lifestyle. What is excellent treatment for one person, may not be for you. HOME CARE INSTRUCTIONS   Do not smoke.   Follow the lifestyle changes outlined in the "Prevention" section.   If you are on medications, follow the directions   carefully. Blood pressure medications must be taken as prescribed. Skipping doses reduces their benefit. It also puts you at risk for problems.   Follow up with your caregiver, as directed.   If you are asked to monitor your blood pressure at home, follow the guidelines in the "Diagnosis" section above.  SEEK MEDICAL CARE IF:   You think you are having medication side effects.   You have recurrent headaches or lightheadedness.     You have swelling in your ankles.   You have trouble with your vision.  SEEK IMMEDIATE MEDICAL CARE IF:   You have sudden onset of chest pain or pressure, difficulty breathing, or other symptoms of a heart attack.   You have a severe headache.   You have symptoms of a stroke (such as sudden weakness, difficulty speaking, difficulty walking).  MAKE SURE YOU:   Understand these instructions.   Will watch your condition.   Will get help right away if you are not doing well or get worse.

## 2011-08-16 NOTE — ED Notes (Signed)
Pt alert & oriented x4, stable gait. Pt given discharge instructions, paperwork. Patient instructed to stop at the registration desk to finish any additional paperwork. pt verbalized understanding. Pt left department w/ no further questions.  

## 2011-08-16 NOTE — ED Notes (Signed)
Pt states his chest started feeling funny, had some "flip flop"  Feeling and tightness in chest, took 3 baby aspirin and 3 of his indapamide after checking his blood pressure and it was 220/110 at home.

## 2011-12-15 ENCOUNTER — Other Ambulatory Visit: Payer: Self-pay | Admitting: Family Medicine

## 2011-12-15 DIAGNOSIS — R109 Unspecified abdominal pain: Secondary | ICD-10-CM

## 2011-12-19 ENCOUNTER — Ambulatory Visit (HOSPITAL_COMMUNITY)
Admission: RE | Admit: 2011-12-19 | Discharge: 2011-12-19 | Disposition: A | Discharge status: Home / Self Care | Payer: BC Managed Care – PPO | Source: Ambulatory Visit | Attending: Family Medicine | Admitting: Family Medicine

## 2011-12-19 DIAGNOSIS — R109 Unspecified abdominal pain: Secondary | ICD-10-CM | POA: Insufficient documentation

## 2011-12-20 ENCOUNTER — Encounter: Payer: Self-pay | Admitting: Physician Assistant

## 2011-12-20 ENCOUNTER — Ambulatory Visit (INDEPENDENT_AMBULATORY_CARE_PROVIDER_SITE_OTHER): Payer: BC Managed Care – PPO | Admitting: Physician Assistant

## 2011-12-20 VITALS — BP 150/98 | HR 72 | Ht 72.0 in | Wt 228.4 lb

## 2011-12-20 DIAGNOSIS — K219 Gastro-esophageal reflux disease without esophagitis: Secondary | ICD-10-CM | POA: Insufficient documentation

## 2011-12-20 DIAGNOSIS — R1013 Epigastric pain: Secondary | ICD-10-CM

## 2011-12-20 DIAGNOSIS — Z86010 Personal history of colonic polyps: Secondary | ICD-10-CM | POA: Insufficient documentation

## 2011-12-20 DIAGNOSIS — R079 Chest pain, unspecified: Secondary | ICD-10-CM

## 2011-12-20 DIAGNOSIS — I10 Essential (primary) hypertension: Secondary | ICD-10-CM

## 2011-12-20 NOTE — Progress Notes (Signed)
Reviewed and agree with management. Nikolis Berent D. Hurley Sobel, M.D., FACG  

## 2011-12-20 NOTE — Patient Instructions (Addendum)
We have scheduled the CT scan for Monday 12-25-2011. We scheduled the Endoscopy with Dr Russella Dar on 01-04-2012 at 10 AM. Directions and brochure provided.  You have been scheduled for a CT scan of the abdomen and pelvis at [facility] ([address]---this is in the same building as [facility]).   You are scheduled on 12-25-2011 at 1:00 PM  You should arrive at 12:45 PM prior to your appointment time for registration. Please follow the written instructions below on the day of your exam:  WARNING: IF YOU ARE ALLERGIC TO IODINE/X-RAY DYE, PLEASE NOTIFY RADIOLOGY IMMEDIATELY AT [phone]! YOU WILL BE GIVEN A 13 HOUR PREMEDICATION PREP.  1) Do not eat or drink anything after9:00 Am  (4 hours prior to your test) 2) You have been given 2 bottles of oral contrast to drink. The solution may taste better if refrigerated, but do NOT add ice or any other liquid to this solution. Shake well before drinking.    Drink 1 bottle of contrast @11 :00 AM  (2 hours prior to your exam)             Drink 1 bottle of contrast @ 12:00  Noon. ( 1 hour prior to your exam)    Upper GI Endoscopy Upper GI endoscopy means using a flexible scope to look at the esophagus, stomach, and upper small bowel. This is done to make a diagnosis in people with heartburn, abdominal pain, or abnormal bleeding. Sometimes an endoscope is needed to remove foreign bodies or food that become stuck in the esophagus; it can also be used to take biopsy samples. For the best results, do not eat or drink for 8 hours before having your upper endoscopy.  To perform the endoscopy, you will probably be sedated and your throat will be numbed with a special spray. The endoscope is then slowly passed down your throat (this will not interfere with your breathing). An endoscopy exam takes 15 to 30 minutes to complete and there is no real pain. Patients rarely remember much about the procedure. The results of the test may take several days if a  biopsy or other test is taken.  You may have a sore throat after an endoscopy exam. Serious complications are very rare. Stick to liquids and soft foods until your pain is better. Do not drive a car or operate any dangerous equipment for at least 24 hours after being sedated. SEEK IMMEDIATE MEDICAL CARE IF:   You have severe throat pain.   You have shortness of breath.   You have bleeding problems.   You have a fever.   You have difficulty recovering from your sedation.  Document Released: 05/11/2004 Document Revised: 03/23/2011 Document Reviewed: 04/05/2008 [facility] Patient Information 2012 [place], [place]. (1 hour prior to your exam)  You may take any medications as prescribed with a small amount of water except for the following: Metformin, Glucophage, Glucovance, Avandamet, Riomet, Fortamet, Actoplus Met, Janumet, Glumetza or Metaglip. The above medications must be held the day of the exam AND 48 hours after the exam.  The purpose of you drinking the oral contrast is to aid in the visualization of your intestinal tract. The contrast solution may cause some diarrhea. Before your exam is started, you will be given a small amount of fluid to drink. Depending on your individual set of symptoms, you may also receive an intravenous injection of x-ray contrast/dye. Plan on being at [facility] for 30 minutes or long, depending on the type  of exam you are having performed.  If you have any questions regarding your exam or if you need to reschedule, you may call the CT department at [phone] between the hours of 8:00 am and 5:00 pm, Monday-Friday.  ________________________________________________________________________

## 2011-12-20 NOTE — Progress Notes (Signed)
Subjective:    Patient ID: [name], male    DOB: 04-21-1955, 56 y.o.   MRN: [id number]  HPI [name] is a pleasant 56 year old white male known to Dr. stark from prior procedures. He had screening colonoscopy done in 2010 with finding of 2 polyps which were tubular adenomas. He reports that he had a more remote endoscopy done for reflux symptoms but I do not have that available at this time. Patient is referred today for evaluation of upper abdominal pain which has been present over the past month area He has been seen by Dr. Gerda Diss his primary physician and had labs done on 12/19/2011 which include a normal CBC normal seem at and lipase as well as an H. pylori antibody which was negative He also had upper.ultrasound done on August 30 which was unremarkable however the pancreas was not visualized. Patient has been taking Protonix 40 mg once daily over the past week He says that he has had a couple episodes over the past month of very sharp pain in his epigastric area after eating a large meal. He says his pain has been transient but fairly intense and has only occurred with very large meals. In between that time he has a dull vague upper abdominal discomfort which is fairly constant. This is not seem to be radiating into his chest or his back he has no associated nausea his appetite has been fine and his weight has been stable. Has not noted any melena or hematochezia. He does not take any regular NSAIDs does take a baby aspirin once daily. He says he has noticed that he gets a feeling that he needs to belch but cannot but this seems to be relieved by carbonated beverages . He denies any current regular dysphagia or odynophagia says he gets a very infrequent episode of food sticking which requires regurgitation.    Review of Systems  HENT: Negative.   Eyes: Negative.   Respiratory: Positive for chest tightness.   Cardiovascular: Positive for chest pain.  Gastrointestinal: Positive for abdominal  pain.  Genitourinary: Negative.   Musculoskeletal: Negative.   Neurological: Negative.   Hematological: Negative.   Psychiatric/Behavioral: Negative.    Outpatient Encounter Prescriptions as of 12/20/2011  Medication Sig Dispense Refill  . aspirin 81 MG tablet Take 81 mg by mouth daily.      . pantoprazole (PROTONIX) 40 MG tablet Take 40 mg by mouth daily.      Marland Kitchen POTASSIUM PO Take 1 tablet by mouth daily.      . Tamsulosin HCl (FLOMAX) 0.4 MG CAPS Take 0.4 mg by mouth as needed.      . indapamide (LOZOL) 2.5 MG tablet Take 2.5 mg by mouth every morning.            Allergies  Allergen Reactions  . Latex     REACTION: rash   Patient Active Problem List  Diagnosis  . HTN (hypertension)  . GERD (gastroesophageal reflux disease)  . Hx of adenomatous colonic polyps   History   Social History  . Marital Status: Married    Spouse Name: N/A    Number of Children: 3  . Years of Education: N/A   Occupational History  . Not on file.   Social History Main Topics  . Smoking status: Former Smoker    Quit date: 12/20/2003  . Smokeless tobacco: Never Used  . Alcohol Use: 0.6 oz/week    1 Shots of liquor per week     occasional  .  Drug Use: No  . Sexually Active: Not on file   Other Topics Concern  . Not on file   Social History Narrative  . No narrative on file    Objective:   Physical Exam well-developed healthy-appearing white male in no acute distress blood pressure 150/90 pulse 72 height 6 foot weight 228. HEENT; nontraumatic normocephalic EOMI PERRLA sclera anicteric,;Neck; Supple no JVD, Cardiovascular; regular rate and rhythm with S1-S2 no murmur or gallop, Pulmonary; clear bilaterally, Abdomen ;soft he has mild tenderness in the epigastrium there is no guarding no rebound no palpable mass or hepatosplenomegaly bowel sounds are active, Rectal exam not done, Extremities; multiple elaborate tattoes ,no clubbing, cyanosis, or edema skin warm and dry, Psych; mood and affect  normal and appropriate.        Assessment & Plan:  #23 56 year old male with a one-month history of epigastric pain and a couple of episodes of more intense sharp epigastric pain occurring after very large meal. Labs and recent ultrasound unremarkable with nonvisualization of the pancreas. Will need to rule out gastropathy, peptic ulcer disease, occult gastric or pancreatic lesion. #2 patient has also been having a second unrelated intermittent chest pain or heaviness with occasional radiation into his neck which appears to be exertional. He is scheduled for evaluation with cardiologist next week, and for stress testing etc.  Plan; schedule for CT scan of the abdomen and pelvis Continue Protonix 40 mg by mouth every morning, patient has a prescription Schedule for upper endoscopy with Dr. Windell Norfolk will be tentatively  scheduled a couple of weeks out to allow him time to get through with his cardiac evaluation. Obviously if there are findings on CT scan endoscopy can be canceled.. procedure was discussed in detail with the patient and he is agreeable to proceed.

## 2011-12-25 ENCOUNTER — Ambulatory Visit (INDEPENDENT_AMBULATORY_CARE_PROVIDER_SITE_OTHER)
Admission: RE | Admit: 2011-12-25 | Discharge: 2011-12-25 | Disposition: A | Discharge status: Home / Self Care | Payer: BC Managed Care – PPO | Source: Ambulatory Visit | Attending: Physician Assistant | Admitting: Physician Assistant

## 2011-12-25 DIAGNOSIS — K219 Gastro-esophageal reflux disease without esophagitis: Secondary | ICD-10-CM

## 2011-12-25 DIAGNOSIS — R1013 Epigastric pain: Secondary | ICD-10-CM

## 2011-12-25 DIAGNOSIS — R079 Chest pain, unspecified: Secondary | ICD-10-CM

## 2011-12-25 MED ORDER — IOHEXOL 300 MG/ML  SOLN
100.0000 mL | Freq: Once | INTRAMUSCULAR | Status: AC | PRN
  Administered 2011-12-25: 100 mL via INTRAVENOUS

## 2011-12-26 ENCOUNTER — Encounter: Payer: Self-pay | Admitting: *Deleted

## 2011-12-26 ENCOUNTER — Ambulatory Visit (INDEPENDENT_AMBULATORY_CARE_PROVIDER_SITE_OTHER): Payer: BC Managed Care – PPO | Admitting: Cardiology

## 2011-12-26 ENCOUNTER — Encounter: Payer: Self-pay | Admitting: Cardiology

## 2011-12-26 ENCOUNTER — Telehealth: Payer: Self-pay | Admitting: *Deleted

## 2011-12-26 VITALS — BP 153/94 | HR 94 | Ht 72.0 in | Wt 230.0 lb

## 2011-12-26 DIAGNOSIS — R0600 Dyspnea, unspecified: Secondary | ICD-10-CM | POA: Insufficient documentation

## 2011-12-26 DIAGNOSIS — R002 Palpitations: Secondary | ICD-10-CM | POA: Insufficient documentation

## 2011-12-26 DIAGNOSIS — R0989 Other specified symptoms and signs involving the circulatory and respiratory systems: Secondary | ICD-10-CM

## 2011-12-26 DIAGNOSIS — R109 Unspecified abdominal pain: Secondary | ICD-10-CM | POA: Insufficient documentation

## 2011-12-26 DIAGNOSIS — F988 Other specified behavioral and emotional disorders with onset usually occurring in childhood and adolescence: Secondary | ICD-10-CM | POA: Insufficient documentation

## 2011-12-26 DIAGNOSIS — R0609 Other forms of dyspnea: Secondary | ICD-10-CM

## 2011-12-26 DIAGNOSIS — I10 Essential (primary) hypertension: Secondary | ICD-10-CM | POA: Insufficient documentation

## 2011-12-26 NOTE — Assessment & Plan Note (Addendum)
Exertional dyspnea likely related to excessive weight and physical deconditioning.  Graded exercise testing to be performed to evaluate exercise tolerance, exclude exercise hypoxemia and to exclude myocardial ischemia.

## 2011-12-26 NOTE — Progress Notes (Signed)
Patient ID: [name], male   DOB: 1955/07/30, 56 y.o.   MRN: [id number]  HPI: Initial Cardiology evaluation performed at the kind request of Drs. Scott Luking to exclude cardiac disease as a cause of dyspnea and to provide clearance for initiation of an exercise program.  [name] was last seen in our practice 5 years ago when he was experiencing palpitations.  Event recording, echocardiography and a stress nuclear study were unrevealing except for the identification of a right bundle branch block on EKG..  Since then, he has done well, but has been progressively more inactive.  Recently, he experiences generalized weakness and exertional dyspnea.  His principle problem has been persistent epigastric pain that has been constant for matter of weeks or months.  A recentCT scan of the abdomen and pelvis was normal.  Patient has a remote history of cigarette smoking discontinued more than 10 years ago without intervening pulmonary symptoms.    Current Outpatient Prescriptions on File Prior to Visit  Medication Sig Dispense Refill  . aspirin 81 MG tablet Take 81 mg by mouth daily.      . indapamide (LOZOL) 2.5 MG tablet Take 2.5 mg by mouth every morning.      Marland Kitchen KLOR-CON M10 10 MEQ tablet       . pantoprazole (PROTONIX) 40 MG tablet Take 40 mg by mouth daily.      . Tamsulosin HCl (FLOMAX) 0.4 MG CAPS Take 0.4 mg by mouth as needed.       Allergies  Allergen Reactions  . Latex     REACTION: rash    Past Medical History  Diagnosis Date  . Hypertension   . GERD (gastroesophageal reflux disease)   . Colon polyps 2011    tubular adenoma by excisional biopsy during colonoscopy  . ADD (attention deficit disorder)     Rx-Adderall  . Degenerative disc disease, cervical   . Palpitations 2003    Holter in 2003: PACs and PVCs; negative stress nuclear test in 2005; right bundle branch block; echo in 2008-mild LVH, otherwise normal; 2008-negative stress nuclear test.  . Prostatitis     prostate  calcifications by CT  . Sleep apnea     questionable diagnosis    Past Surgical History  Procedure Date  . Anterior cervical decomp/discectomy fusion   . Knee surgery     rt  . Colonoscopy w/ polypectomy 2011    tubular adenoma    Family History  Problem Relation Age of Onset  . Heart disease Mother   . Hypertension Mother     Carotid disease   History   Social History  . Marital Status: Married    Spouse Name: N/A    Number of Children: 3  . Years of Education: N/A   Occupational History  . Not on file.   Social History Main Topics  . Smoking status: Former Smoker    Types: Cigarettes    Quit date: 12/20/2003  . Smokeless tobacco: Never Used  . Alcohol Use: 0.6 oz/week    1 Shots of liquor per week     occasional  . Drug Use: Yes     30 + years ago - "crank, marjiuanna, ect."  . Sexually Active: Not on file   Other Topics Concern  . Not on file   Social History Narrative  . No narrative on file    ROS: Patient notes decreased energy, recent weight gain, lower substernal disc comfort which he attributes to abdominal gas and  which is relieved with Alka-Seltzer and a workstation, intermittent constipation.  All other systems reviewed and are negative.  PHYSICAL EXAM: BP 153/94  Pulse 94  Ht 6' (1.829 m)  Wt 104.327 kg (230 lb)  BMI 31.19 kg/m2   General-Well-developed; no acute distress Body Habitus-Mildly to moderately overweight HEENT-[place]/AT; PERRL; EOM intact; conjunctiva and lids nl Neck-No JVD; no carotid bruits Endocrine-No thyromegaly Lungs-Clear lung fields; resonant percussion; normal I-to-E ratio Cardiovascular- normal PMI; normal S1 and S2 Abdomen-BS normal; soft and non-tender without masses or organomegaly Musculoskeletal-No deformities, cyanosis or clubbing Neurologic-Nl cranial nerves; symmetric strength and tone Skin- Warm, no significant lesions; multiple tattoos Extremities-Nl distal pulses; no edema  EKG:  EKG performed recently in  Dr. Fletcher Anon office obtained and reviewed.  Normal sinus rhythm, right bundle branch block, rightward axis, minor nonspecific ST segment abnormality isolated to lead III.   ASSESSMENT AND PLAN:  [name] Bing, MD 12/26/2011 4:29 PM

## 2011-12-26 NOTE — Patient Instructions (Addendum)
Your physician has requested that you have an exercise tolerance test. For further information please visit [email or web address] Please also follow instruction sheet, as given.  Your physician recommends that you schedule a follow-up appointment as needed with Dr. Dietrich Pates.    Exercise Stress Electrocardiography An exercise stress test is a heart test (EKG) which is done while you are moving. You will walk on a treadmill. This test will tell your doctor how your heart does when it is forced to work harder and how much activity you can safely handle. BEFORE THE TEST  Wear shorts or athletic pants.   Wear comfortable tennis shoes.   Women need to wear a bra that allows patches to be put on under it.  TEST  An EKG cable will be attached to your waist. This cable is hooked up to patches, which look like round stickers stuck to your chest.   You will be asked to walk on the treadmill.   You will walk until you are too tired or until you are told to stop.   Tell the doctor right away if you have:   Chest pain.   Leg cramps.   Shortness of breath.   Dizziness.   The test may last 30 minutes to 1 hour. The timing depends on your physical condition and the condition of your heart.  AFTER THE TEST  You will rest for about 6 minutes. During this time, your heart rhythm and blood pressure will be checked.   The testing equipment will be removed from your body and you can get dressed.   You may go home or back to your hospital room. You may keep doing all your usual activities as told by your doctor.  Finding out the results of your test Ask when your test results will be ready. Make sure you get your test results. Document Released: 09/20/2007 Document Revised: 03/23/2011 Document Reviewed: 09/20/2007 [facility] Patient Information 2012 [place], [place].

## 2011-12-26 NOTE — Assessment & Plan Note (Signed)
Blood pressure control is marginal at this visit.  We will ask patient to collect additional determinations and address the possible need for adjustment of his antihypertensive medication after reviewing those values.

## 2011-12-26 NOTE — Progress Notes (Deleted)
Name: [name]    DOB: [birth year]  Age: 56 y.o.  MR#: [id number]       PCP:  Lilyan Punt, MD      Insurance: @PAYORNAME @   CC:   No chief complaint on file.   VS BP 153/94  Pulse 94  Ht 6' (1.829 m)  Wt 230 lb (104.327 kg)  BMI 31.19 kg/m2  Weights Current Weight  12/26/11 230 lb (104.327 kg)  12/20/11 228 lb 6.4 oz (103.602 kg)  08/16/11 220 lb (99.791 kg)    Blood Pressure  BP Readings from Last 3 Encounters:  12/26/11 153/94  12/20/11 150/98  08/16/11 152/84     Admit date:  (Not on file) Last encounter with RMR:  Visit date not found   Allergy Allergies  Allergen Reactions  . Latex     REACTION: rash    Current Outpatient Prescriptions  Medication Sig Dispense Refill  . aspirin 81 MG tablet Take 81 mg by mouth daily.      . indapamide (LOZOL) 2.5 MG tablet Take 2.5 mg by mouth every morning.      Marland Kitchen KLOR-CON M10 10 MEQ tablet       . pantoprazole (PROTONIX) 40 MG tablet Take 40 mg by mouth daily.      . Tamsulosin HCl (FLOMAX) 0.4 MG CAPS Take 0.4 mg by mouth as needed.        Discontinued Meds:    Medications Discontinued During This Encounter  Medication Reason  . POTASSIUM PO Discontinued by provider    Patient Active Problem List  Diagnosis  . GERD (gastroesophageal reflux disease)  . Hx of adenomatous colonic polyps  . Hypertension  . ADD (attention deficit disorder)  . Palpitations    LABS No visits with results within 3 Month(s) from this visit. Latest known visit with results is:  Admission on 08/16/2011, Discharged on 08/16/2011  Component Date Value  . WBC 08/16/2011 10.0   . RBC 08/16/2011 5.59   . Hemoglobin 08/16/2011 16.7   . HCT 08/16/2011 49.0   . MCV 08/16/2011 87.7   . MCH 08/16/2011 29.9   . MCHC 08/16/2011 34.1   . RDW 08/16/2011 13.2   . Platelets 08/16/2011 242   . Troponin i, poc 08/16/2011 0.01   . Comment 3 08/16/2011          . Sodium 08/16/2011 141   . Potassium 08/16/2011 3.4*  . Chloride 08/16/2011  103   . BUN 08/16/2011 15   . Creatinine, Ser 08/16/2011 1.00   . Glucose, Bld 08/16/2011 98   . Calcium, Ion 08/16/2011 1.27   . TCO2 08/16/2011 28   . Hemoglobin 08/16/2011 17.7*  . HCT 08/16/2011 52.0      Results for this Opt Visit:     Results for orders placed during the hospital encounter of 08/16/11  CBC      Component Value Range   WBC 10.0  4.0 - 10.5 K/uL   RBC 5.59  4.22 - 5.81 MIL/uL   Hemoglobin 16.7  13.0 - 17.0 g/dL   HCT 40.9  81.1 - 91.4 %   MCV 87.7  78.0 - 100.0 fL   MCH 29.9  26.0 - 34.0 pg   MCHC 34.1  30.0 - 36.0 g/dL   RDW 78.2  95.6 - 21.3 %   Platelets 242  150 - 400 K/uL  POCT I-STAT TROPONIN I      Component Value Range   Troponin i, poc 0.01  0.00 -  0.08 ng/mL   Comment 3           POCT I-STAT, CHEM 8      Component Value Range   Sodium 141  135 - 145 mEq/L   Potassium 3.4 (*) 3.5 - 5.1 mEq/L   Chloride 103  96 - 112 mEq/L   BUN 15  6 - 23 mg/dL   Creatinine, Ser 9.60  0.50 - 1.35 mg/dL   Glucose, Bld 98  70 - 99 mg/dL   Calcium, Ion 4.54  0.98 - 1.32 mmol/L   TCO2 28  0 - 100 mmol/L   Hemoglobin 17.7 (*) 13.0 - 17.0 g/dL   HCT 11.9  14.7 - 82.9 %    EKG Orders placed during the hospital encounter of 08/16/11  . ED EKG  . ED EKG  . EKG 12-LEAD  . EKG 12-LEAD  . EKG     Prior Assessment and Plan Problem List as of 12/26/2011            Cardiology Problems   Hypertension     Other   GERD (gastroesophageal reflux disease)   Hx of adenomatous colonic polyps   ADD (attention deficit disorder)   Palpitations       Imaging: US Abdomen Complete  12/19/2011  *RADIOLOGY REPORT*  Clinical Data:  Abdominal pain.  COMPLETE ABDOMINAL ULTRASOUND  Comparison:  CT scan 02/15/2006.  Findings:  Gallbladder:  No gallstones, gallbladder wall thickening, or pericholecystic fluid.  Common bile duct:  Normal in caliber measuring a maximum of 5.14mm.  Liver:  The liver is sonographically unremarkable.  There is normal echogenicity without focal  lesions or intrahepatic biliary dilatation.  IVC:  Normal caliber.  Pancreas:  Incomplete visualization due to overlying bowel gas. The body region that is visualized appears normal.  Spleen:  Normal size and echogenicity without focal lesions.  Right Kidney:  11.7 cm in length. Normal renal cortical thickness and echogenicity without focal lesions or hydronephrosis.  Left Kidney:  10.1 cm in length. Normal renal cortical thickness and echogenicity without focal lesions or hydronephrosis.  Abdominal aorta:  Normal caliber.  IMPRESSION:  1.  Normal appearance of the gallbladder and normal caliber common bile duct. 2.  Limited visualization of the pancreas. 3.  Normal sonographic appearance of the liver, spleen and both kidneys.   Original Report Authenticated By: P. Loralie Champagne, M.D.    Ct Abdomen Pelvis W Contrast  12/25/2011  *RADIOLOGY REPORT*  Clinical Data: Epigastric abdominal pain  CT ABDOMEN AND PELVIS WITH CONTRAST  Technique:  Multidetector CT imaging of the abdomen and pelvis was performed following the standard protocol during bolus administration of intravenous contrast.  Contrast: OMNIPAQUE IOHEXOL 300 MG/ML  SOLN  Comparison: 02/15/2006  Findings: Curvilinear probable lower lobe atelectasis noted. Previously questioned hypoattenuating lesion in the spleen is not identified.  Spleen, liver, gallbladder, adrenal glands, kidneys, and pancreas are normal.  Stomach and duodenal sweep are within normal limits allowing for CT technique.  No ascites or lymphadenopathy.  The appendix is normal.  Prostatic calcification and impression upon the base of the bladder incidentally noted.  No bowel wall thickening or focal segmental dilatation.  No retroperitoneal, pelvic, or abdominal lymphadenopathy.  No ascites.  Minimal atherosclerotic aortic calcification without aneurysm.  No acute osseous abnormality.  IMPRESSION: No acute intra-abdominal or pelvic pathology.  Specifically, no abnormality is  identified to explain the history of epigastric abdominal pain.   Original Report Authenticated By: Harrel Lemon, M.D.  FRS Calculation: Score not calculated. Missing: Total Cholesterol, HDL

## 2011-12-26 NOTE — Telephone Encounter (Signed)
I advised the patient that his CT scan results were normal, per Dr. Ardell Isaacs nurse Darcey Nora, RN.  I also apologized to the pt due to having to reschedule the EGD from 9-19- to 01-18-2012.  I didn't put him on the [facility] schedule. I did tell him that we can put him on the cancellation schedule. The patient was okay with that.

## 2011-12-27 ENCOUNTER — Encounter: Payer: Self-pay | Admitting: Cardiology

## 2011-12-28 ENCOUNTER — Ambulatory Visit (INDEPENDENT_AMBULATORY_CARE_PROVIDER_SITE_OTHER): Payer: BC Managed Care – PPO | Admitting: Cardiology

## 2011-12-28 DIAGNOSIS — I10 Essential (primary) hypertension: Secondary | ICD-10-CM

## 2011-12-28 DIAGNOSIS — R0989 Other specified symptoms and signs involving the circulatory and respiratory systems: Secondary | ICD-10-CM

## 2011-12-28 DIAGNOSIS — R0609 Other forms of dyspnea: Secondary | ICD-10-CM

## 2011-12-28 DIAGNOSIS — R0600 Dyspnea, unspecified: Secondary | ICD-10-CM

## 2011-12-28 NOTE — Progress Notes (Signed)
Stress Lab Nurses Notes - Gildo Latimore Nethery 12/28/2011 Reason for doing test: Dyspnea and HTN Type of test: Regular GTX Nurse performing test: Parke Poisson, RN Nuclear Medicine Tech: Not Applicable Echo Tech: Not Applicable MD performing test: Ival Bible & Joni Reining NP Family MD: Lilyan Punt Test explained and consent signed: yes IV started: No IV started Symptoms: fatigue Treatment/Intervention: None Reason test stopped: fatigue and reached target HR After recovery IV was: NA Patient to return to Nuc. Med at : NA Patient discharged: Home Patient's Condition upon discharge was: stable Comments: During test peak BP 210/78 & HR 151.  Recovery BP 142/78 & HR 92.  Symptoms resolved in recovery. Erskine Speed T

## 2011-12-28 NOTE — Progress Notes (Signed)
Attending note:  Baseline tracing shows sinus rhythm at 88 beats per minute with incomplete right bundle branch block, right upper axis. Patient was exercised on a Bruce protocol for 10 minutes and 13 seconds achieving a maximum workload of 11.8 METs. Peak heart rate was 157 beats per minute, 96% of the maximum age predicted heart rate response. There was a hypertensive response to exercise, peak of 210/78. No chest pain was reported. Abnormal ST segment changes were noted in leads II, III, aVF, and V4 during stage 2 and 3. ST segment depression of approximately 1-1.5 mm noted, although not consistently seen in all of these leads suggesting some degree of motion artifact. No arrhythmias were noted.  Pulse oximetry was not obtained during exercise. Overall abnormal study.  Jonelle Sidle, M.D., F.A.C.C.

## 2012-01-05 ENCOUNTER — Telehealth: Payer: Self-pay | Admitting: *Deleted

## 2012-01-15 ENCOUNTER — Telehealth: Payer: Self-pay

## 2012-01-15 NOTE — Telephone Encounter (Signed)
Patient recently had an abnormal stress test on 12/28/11.  Dr. Diona Browner he is scheduled for a EGD with Dr. Russella Dar for 01/17/12.  Is it ok to proceed from a cardiac standpoint?

## 2012-01-15 NOTE — Telephone Encounter (Signed)
Message copied by Annett Fabian on Mon Jan 15, 2012  2:15 PM ------      Message from: Claudette Head T      Created: Mon Jan 15, 2012  2:01 PM      Regarding: FW: ASA IV?       Will need to postpone EGD unless he can cleared by cardiology before the EGD.                  ----- Message -----         From: Cathlyn Parsons, CRNA         Sent: 01/15/2012   1:47 PM           To: Meryl Dare, MD,FACG, Jannifer Franklin, RN, #      Subject: ASA IV?                                                  Dr. Russella Dar,            This patient has had epigastric pain and was scheduled with you for an EGD on 10/3.  Subsequently he had a positive stress test on 9/12 ordered by a Dr. Gracy Racer.  Dr. Gracy Racer notes the positive result but does not present a plan.            Just wanted to give a heads up because it does not appear his cardiology w/u has been completed            Thanks,            Jonny Ruiz

## 2012-01-15 NOTE — Telephone Encounter (Signed)
This is a patient of Dr. Dietrich Pates, seen in the office on 9/10 for consultation. GXT was done on 912 - this was not an office visit, just the report of his GXT. This should be reviewed by Dr. Dietrich Pates.

## 2012-01-15 NOTE — Telephone Encounter (Signed)
Sorry, Dr. Dietrich Pates please advise if ok for EGD on 01/18/12

## 2012-01-16 NOTE — Telephone Encounter (Signed)
Excellent exercise tolerance with equivocal stress test reading and absence of symptoms to suggest myocardial ischemia. EGD is a very low risk procedure and can proceed without further cardiac evaluation, but patient will require a return visit to see me in the first available slot.

## 2012-01-16 NOTE — Telephone Encounter (Signed)
I spoke with Tomi at Dr. Marvel Plan office, she will try and get me an answer ASAP.

## 2012-01-16 NOTE — Telephone Encounter (Signed)
Please see recommendations

## 2012-01-16 NOTE — Telephone Encounter (Signed)
OK to proceed with EGD as scheduled.

## 2012-01-16 NOTE — Telephone Encounter (Signed)
See Dr. Marvel Plan recommendations

## 2012-01-16 NOTE — Telephone Encounter (Signed)
cleared

## 2012-01-18 ENCOUNTER — Ambulatory Visit (AMBULATORY_SURGERY_CENTER): Payer: BC Managed Care – PPO | Admitting: Gastroenterology

## 2012-01-18 ENCOUNTER — Encounter: Payer: Self-pay | Admitting: Gastroenterology

## 2012-01-18 VITALS — BP 144/95 | HR 76 | Temp 97.7°F | Resp 20 | Ht 72.0 in | Wt 228.0 lb

## 2012-01-18 DIAGNOSIS — J320 Chronic maxillary sinusitis: Secondary | ICD-10-CM

## 2012-01-18 DIAGNOSIS — R079 Chest pain, unspecified: Secondary | ICD-10-CM

## 2012-01-18 DIAGNOSIS — K2960 Other gastritis without bleeding: Secondary | ICD-10-CM

## 2012-01-18 DIAGNOSIS — K219 Gastro-esophageal reflux disease without esophagitis: Secondary | ICD-10-CM

## 2012-01-18 DIAGNOSIS — R1013 Epigastric pain: Secondary | ICD-10-CM

## 2012-01-18 MED ORDER — SODIUM CHLORIDE 0.9 % IV SOLN: 500.0000 mL | INTRAVENOUS | Status: DC

## 2012-01-18 NOTE — Patient Instructions (Addendum)
YOU HAD AN ENDOSCOPIC PROCEDURE TODAY AT THE [facility]: Refer to the procedure report that was given to you for any specific questions about what was found during the examination.  If the procedure report does not answer your questions, please call your gastroenterologist to clarify.  If you requested that your care partner not be given the details of your procedure findings, then the procedure report has been included in a sealed envelope for you to review at your convenience later.  YOU SHOULD EXPECT: Some feelings of bloating in the abdomen. Passage of more gas than usual.  Walking can help get rid of the air that was put into your GI tract during the procedure and reduce the bloating. DIET: Your first meal following the procedure should be a light meal and then it is ok to progress to your normal diet.  A half-sandwich or bowl of soup is an example of a good first meal.  Heavy or fried foods are harder to digest and may make you feel nauseous or bloated.  Likewise meals heavy in dairy and vegetables can cause extra gas to form and this can also increase the bloating.  Drink plenty of fluids but you should avoid alcoholic beverages for 24 hours.  ACTIVITY: Your care partner should take you home directly after the procedure.  You should plan to take it easy, moving slowly for the rest of the day.  You can resume normal activity the day after the procedure however you should NOT DRIVE or use heavy machinery for 24 hours (because of the sedation medicines used during the test).    SYMPTOMS TO REPORT IMMEDIATELY: A gastroenterologist can be reached at any hour.  During normal business hours, 8:30 AM to 5:00 PM Monday through Friday, call [phone].  After hours and on weekends, please call the GI answering service at [phone] who will take a message and have the physician on call contact you.   Following upper endoscopy (EGD)  Vomiting of blood or coffee ground material  New  chest pain or pain under the shoulder blades  Painful or persistently difficult swallowing  New shortness of breath  Fever of 100F or higher  Black, tarry-looking stools  FOLLOW UP: If any biopsies were taken you will be contacted by phone or by letter within the next 1-3 weeks.  Call your gastroenterologist if you have not heard about the biopsies in 3 weeks.  Our staff will call the home number listed on your records the next business day following your procedure to check on you and address any questions or concerns that you may have at that time regarding the information given to you following your procedure. This is a courtesy call and so if there is no answer at the home number and we have not heard from you through the emergency physician on call, we will assume that you have returned to your regular daily activities without incident.  SIGNATURES/CONFIDENTIALITY: You and/or your care partner have signed paperwork which will be entered into your electronic medical record.  These signatures attest to the fact that that the information above on your After Visit Summary has been reviewed and is understood.  Full responsibility of the confidentiality of this discharge information lies with you and/or your care-partner.   Minimize ASA and NSAID use- ok to continue daily Aspirin for cardiovascular reasons, but would avoid additional Aspirin and NSAIDS use  Ok to resume medications  Await pathology results

## 2012-01-18 NOTE — Progress Notes (Signed)
Patient did not experience any of the following events: a burn prior to discharge; a fall within the facility; wrong site/side/patient/procedure/implant event; or a hospital transfer or hospital admission upon discharge from the facility. (G8907) Patient did not have preoperative order for IV antibiotic SSI prophylaxis. (G8918)  

## 2012-01-18 NOTE — Op Note (Signed)
[facility] [address] [place], [address]   ENDOSCOPY PROCEDURE REPORT  PATIENT: [name], [name]  MR#: [id number] BIRTHDATE: 23-Jun-1955 , 56  yrs. old GENDER: Male ENDOSCOPIST: Meryl Dare, MD, [facility]  PROCEDURE DATE:  01/18/2012 PROCEDURE:  EGD w/ biopsy ASA CLASS:     Class II INDICATIONS:  chest pain,  epigastric pain MEDICATIONS: MAC sedation, administered by CRNA and propofol (Diprivan) 250mg  IV TOPICAL ANESTHETIC: none DESCRIPTION OF PROCEDURE: After the risks benefits and alternatives of the procedure were thoroughly explained, informed consent was obtained.  The LB-GIF Q180 [id number] endoscope was introduced through the mouth and advanced to the second portion of the duodenum. Without limitations.  The instrument was slowly withdrawn as the mucosa was fully examined.   STOMACH: Erosive gastritis (inflammation) was found in the gastric antrum.  Multiple biopsies were performed.   The stomach otherwise appeared normal. ESOPHAGUS: The mucosa of the esophagus appeared normal. DUODENUM: Mild duodenal inflammation was found in the duodenal bulb. It was erythematous. The duodenal mucosa showed no abnormalities in the 2nd part of the duodenum.  Retroflexed views revealed no abnormalities.     The scope was then withdrawn from the patient and the procedure completed.  COMPLICATIONS: There were no complications.  ENDOSCOPIC IMPRESSION: 1.   Erosive gastritis (inflammation); multiple biopsies 2.   Duodenitis in the duodenal bulb  RECOMMENDATIONS: 1.  Continue daily PPI 2.  Minimize ASA/NSAID useage-daily ASA for cardiovascular reasons is OK but would avoid additional ASA/NSAIDs 3.  Await pathology results    eSigned:  Meryl Dare, MD, Clementeen Graham 01/18/2012 2:14 PM   XB:JYNWG Gerda Diss, MD

## 2012-01-19 ENCOUNTER — Telehealth: Payer: Self-pay | Admitting: *Deleted

## 2012-01-19 NOTE — Telephone Encounter (Signed)
  Follow up Call-  Call back number 01/18/2012  Post procedure Call Back phone  # [phone]  Permission to leave phone message Yes     Patient questions:  Do you have a fever, pain , or abdominal swelling? no Pain Score  0 *  Have you tolerated food without any problems? yes  Have you been able to return to your normal activities? yes  Do you have any questions about your discharge instructions: Diet   no Medications  no Follow up visit  no  Do you have questions or concerns about your Care? no  Actions: * If pain score is 4 or above: No action needed, pain <4.

## 2012-01-24 ENCOUNTER — Encounter: Payer: Self-pay | Admitting: Gastroenterology

## 2012-03-11 ENCOUNTER — Ambulatory Visit (HOSPITAL_COMMUNITY)
Admission: RE | Admit: 2012-03-11 | Discharge: 2012-03-11 | Disposition: A | Discharge status: Home / Self Care | Payer: BC Managed Care – PPO | Source: Ambulatory Visit | Attending: Family Medicine | Admitting: Family Medicine

## 2012-03-11 ENCOUNTER — Other Ambulatory Visit: Payer: Self-pay | Admitting: Family Medicine

## 2012-03-11 DIAGNOSIS — R079 Chest pain, unspecified: Secondary | ICD-10-CM

## 2012-03-13 ENCOUNTER — Ambulatory Visit: Payer: BC Managed Care – PPO | Admitting: Cardiology

## 2012-03-19 ENCOUNTER — Ambulatory Visit: Payer: BC Managed Care – PPO | Admitting: Adult Health

## 2012-03-21 ENCOUNTER — Ambulatory Visit (INDEPENDENT_AMBULATORY_CARE_PROVIDER_SITE_OTHER): Payer: BC Managed Care – PPO | Admitting: Adult Health

## 2012-03-21 ENCOUNTER — Encounter: Payer: Self-pay | Admitting: Adult Health

## 2012-03-21 VITALS — BP 150/80 | HR 68 | Ht 72.0 in | Wt 235.0 lb

## 2012-03-21 DIAGNOSIS — R002 Palpitations: Secondary | ICD-10-CM

## 2012-03-21 DIAGNOSIS — I10 Essential (primary) hypertension: Secondary | ICD-10-CM

## 2012-03-21 NOTE — Assessment & Plan Note (Signed)
I have decided to take him off of indipimide 2.5 and begin him on samples of azilsartan chlorthalidone 40/12.5 mg. He is moderately hypertensive here in the clinic. He will return to the office for a nurse check next week on this medication.  If he continues to have trouble with compliance due to memory issues, may need to consider a TTS patch for 7 days at time to assist with consistent BP control to help with compliance. I do not see that he has had an echo with history of hypertension and palpitations. Will plan to order this.

## 2012-03-21 NOTE — Assessment & Plan Note (Addendum)
Stress test read by Dr. Diona Browner is read as abnormal. I have discussed this with Dr.Ross on site. We will review the EKG tracings during the stress test. If necessary will plan cardiac cath after review.  ADDENDUM:    Stress test EKG tracings reviewed by Dr.Ross on site. Felt to be a normal stress test. No further ischemic testing is planned.

## 2012-03-21 NOTE — Progress Notes (Deleted)
   HPI:   Allergies  Allergen Reactions  . Latex     REACTION: rash    Current Outpatient Prescriptions  Medication Sig Dispense Refill  . aspirin 81 MG tablet Take 81 mg by mouth daily.      . indapamide (LOZOL) 2.5 MG tablet Take 2.5 mg by mouth every morning.      Marland Kitchen KLOR-CON M10 10 MEQ tablet Take 10 mEq by mouth daily.       . pantoprazole (PROTONIX) 40 MG tablet Take 40 mg by mouth daily.      . Tamsulosin HCl (FLOMAX) 0.4 MG CAPS Take 0.4 mg by mouth as needed.        Past Medical History  Diagnosis Date  . Hypertension   . GERD (gastroesophageal reflux disease)   . Colon polyps 2011    tubular adenoma by excisional biopsy during colonoscopy  . ADD (attention deficit disorder)     Rx-Adderall  . Degenerative disc disease, cervical   . Palpitations 2003    Holter in 2003: PACs and PVCs; negative stress nuclear test in 2005; right bundle branch block; echo in 2008-mild LVH, otherwise normal; 2008-negative stress nuclear test.  . Prostatitis     prostate calcifications by CT  . Sleep apnea     questionable diagnosis    Past Surgical History  Procedure Date  . Anterior cervical decomp/discectomy fusion   . Knee surgery     rt  . Colonoscopy w/ polypectomy 2011    tubular adenoma    ROS: PHYSICAL EXAM BP 150/80  Pulse 68  Ht 6' (1.829 m)  Wt 235 lb 0.6 oz (106.613 kg)  BMI 31.88 kg/m2  EKG:  ASSESSMENT AND PLAN

## 2012-03-21 NOTE — Progress Notes (Signed)
   HPI: [name] is a 56 y/o patient of Dr.Rohtbart,l we are seeing for ongoing assessment of chest discomfort and difficult to control hypertension. He is here to discuss recent GXT completed on 12/28/2011. During the stress test he did not have chest pain but did have a hypertensive response to exercise. BP 210/78.  Dr. Diona Browner read this as "overall abnormal" although motion artifact was predominate. The patient also says that he has ADD and does not take his medications as directed due to memory issues. He denies chest pain or recurrent palpitations.  Allergies  Allergen Reactions  . Latex     REACTION: rash    Current Outpatient Prescriptions  Medication Sig Dispense Refill  . aspirin 81 MG tablet Take 81 mg by mouth daily.      . indapamide (LOZOL) 2.5 MG tablet Take 2.5 mg by mouth every morning.      Marland Kitchen KLOR-CON M10 10 MEQ tablet Take 10 mEq by mouth daily.       . pantoprazole (PROTONIX) 40 MG tablet Take 40 mg by mouth daily.      . Tamsulosin HCl (FLOMAX) 0.4 MG CAPS Take 0.4 mg by mouth as needed.        Past Medical History  Diagnosis Date  . Hypertension   . GERD (gastroesophageal reflux disease)   . Colon polyps 2011    tubular adenoma by excisional biopsy during colonoscopy  . ADD (attention deficit disorder)     Rx-Adderall  . Degenerative disc disease, cervical   . Palpitations 2003    Holter in 2003: PACs and PVCs; negative stress nuclear test in 2005; right bundle branch block; echo in 2008-mild LVH, otherwise normal; 2008-negative stress nuclear test.  . Prostatitis     prostate calcifications by CT  . Sleep apnea     questionable diagnosis    Past Surgical History  Procedure Date  . Anterior cervical decomp/discectomy fusion   . Knee surgery     rt  . Colonoscopy w/ polypectomy 2011    tubular adenoma    ZOX:WRUEAV of systems complete and found to be negative unless listed above  PHYSICAL EXAM BP 150/80  Pulse 68  Ht 6' (1.829 m)  Wt 235 lb 0.6  oz (106.613 kg)  BMI 31.88 kg/m2  General: Well developed, well nourished, in no acute distress Head: Eyes PERRLA, No xanthomas.   Normal cephalic and atramatic  Lungs: Clear bilaterally to auscultation and percussion. Heart: HRRR S1 S2, without MRG.  Pulses are 2+ & equal.            No carotid bruit. No JVD.  No abdominal bruits. No femoral bruits. Abdomen: Bowel sounds are positive, abdomen soft and non-tender without masses or                  Hernia's noted. Msk:  Back normal, normal gait. Normal strength and tone for age. Extremities: No clubbing, cyanosis or edema.  DP +1 Neuro: Alert and oriented X 3. Psych:  Good affect, responds appropriately    ASSESSMENT AND PLAN

## 2012-03-21 NOTE — Progress Notes (Deleted)
Name: [name]    DOB: 31-Jul-1955  Age: 56 y.o.  MR#: [id number]       PCP:  Lilyan Punt, MD      Insurance: @PAYORNAME @   CC:    Chief Complaint  Patient presents with  . Follow-up    Occassional rythym issues    VS BP 150/80  Pulse 68  Ht 6' (1.829 m)  Wt 235 lb 0.6 oz (106.613 kg)  BMI 31.88 kg/m2  Weights Current Weight  03/21/12 235 lb 0.6 oz (106.613 kg)  01/18/12 228 lb (103.42 kg)  12/26/11 230 lb (104.327 kg)    Blood Pressure  BP Readings from Last 3 Encounters:  03/21/12 150/80  01/18/12 144/95  12/26/11 153/94     Admit date:  (Not on file) Last encounter with RMR:  03/19/2012   Allergy Allergies  Allergen Reactions  . Latex     REACTION: rash    Current Outpatient Prescriptions  Medication Sig Dispense Refill  . aspirin 81 MG tablet Take 81 mg by mouth daily.      . indapamide (LOZOL) 2.5 MG tablet Take 2.5 mg by mouth every morning.      Marland Kitchen KLOR-CON M10 10 MEQ tablet Take 10 mEq by mouth daily.       . pantoprazole (PROTONIX) 40 MG tablet Take 40 mg by mouth daily.      . Tamsulosin HCl (FLOMAX) 0.4 MG CAPS Take 0.4 mg by mouth as needed.        Discontinued Meds:   There are no discontinued medications.  Patient Active Problem List  Diagnosis  . GERD (gastroesophageal reflux disease)  . Hx of adenomatous colonic polyps  . Hypertension  . ADD (attention deficit disorder)  . Palpitations  . Exertional dyspnea  . Abdominal  pain    LABS No visits with results within 3 Month(s) from this visit. Latest known visit with results is:  Admission on 08/16/2011, Discharged on 08/16/2011  Component Date Value  . WBC 08/16/2011 10.0   . RBC 08/16/2011 5.59   . Hemoglobin 08/16/2011 16.7   . HCT 08/16/2011 49.0   . MCV 08/16/2011 87.7   . MCH 08/16/2011 29.9   . MCHC 08/16/2011 34.1   . RDW 08/16/2011 13.2   . Platelets 08/16/2011 242   . Troponin i, poc 08/16/2011 0.01   . Comment 3 08/16/2011          . Sodium 08/16/2011 141   .  Potassium 08/16/2011 3.4*  . Chloride 08/16/2011 103   . BUN 08/16/2011 15   . Creatinine, Ser 08/16/2011 1.00   . Glucose, Bld 08/16/2011 98   . Calcium, Ion 08/16/2011 1.27   . TCO2 08/16/2011 28   . Hemoglobin 08/16/2011 17.7*  . HCT 08/16/2011 52.0      Results for this Opt Visit:     Results for orders placed during the hospital encounter of 08/16/11  CBC      Component Value Range   WBC 10.0  4.0 - 10.5 K/uL   RBC 5.59  4.22 - 5.81 MIL/uL   Hemoglobin 16.7  13.0 - 17.0 g/dL   HCT 78.2  95.6 - 21.3 %   MCV 87.7  78.0 - 100.0 fL   MCH 29.9  26.0 - 34.0 pg   MCHC 34.1  30.0 - 36.0 g/dL   RDW 08.6  57.8 - 46.9 %   Platelets 242  150 - 400 K/uL  POCT I-STAT TROPONIN I  Component Value Range   Troponin i, poc 0.01  0.00 - 0.08 ng/mL   Comment 3           POCT I-STAT, CHEM 8      Component Value Range   Sodium 141  135 - 145 mEq/L   Potassium 3.4 (*) 3.5 - 5.1 mEq/L   Chloride 103  96 - 112 mEq/L   BUN 15  6 - 23 mg/dL   Creatinine, Ser 4.54  0.50 - 1.35 mg/dL   Glucose, Bld 98  70 - 99 mg/dL   Calcium, Ion 0.98  1.19 - 1.32 mmol/L   TCO2 28  0 - 100 mmol/L   Hemoglobin 17.7 (*) 13.0 - 17.0 g/dL   HCT 14.7  82.9 - 56.2 %    EKG Orders placed in visit on 12/28/11  . EXERCISE TOLERANCE TEST     Prior Assessment and Plan Problem List as of 03/21/2012          GERD (gastroesophageal reflux disease)   Hx of adenomatous colonic polyps   Hypertension   Last Assessment & Plan Note   12/26/2011 Office Visit Signed 12/26/2011  4:39 PM by Kathlen Brunswick, MD    Blood pressure control is marginal at this visit.  We will ask patient to collect additional determinations and address the possible need for adjustment of his antihypertensive medication after reviewing those values.    ADD (attention deficit disorder)   Palpitations   Exertional dyspnea   Last Assessment & Plan Note   12/26/2011 Office Visit Addendum 12/29/2011 10:04 AM by Kathlen Brunswick, MD     Exertional dyspnea likely related to excessive weight and physical deconditioning.  Graded exercise testing to be performed to evaluate exercise tolerance, exclude exercise hypoxemia and to exclude myocardial ischemia.     Abdominal  pain       Imaging: Dg Chest 2 View  03/11/2012  *RADIOLOGY REPORT*  Clinical Data: Chest pain.  CHEST - 2 VIEW  Comparison: 08/16/2011  Findings: The heart size and pulmonary vascularity are normal. There is slight chronic accentuation of the interstitial markings with minimal linear scarring at the left base.  No acute infiltrates or effusions.  No acute osseous abnormality.  IMPRESSION: No acute disease.   Original Report Authenticated By: Francene Boyers, M.D.      [facility] Calculation: Score not calculated. Missing: Total Cholesterol, HDL

## 2012-03-21 NOTE — Patient Instructions (Addendum)
Your physician recommends that you schedule a follow-up appointment in: ONE MONTH WITH KL/SM  Your physician recommends that you schedule a follow-up appointment: NEXT WEEK FOR A BP CHECK WITH THE NURSE  Your physician recommends that you return for lab work in: SAME DAY AS NURSE VISIT APT, LOCATED AT [facility] FROM [place], TAKE SLIPS GIVEN AT YOUR OFFICE VISIT TODAY  Your physician has recommended you make the following change in your medication:  1) STOP TAKING LOZOL (INDAPAMIDE) 2.5MG  2) START TAKING EDARBYCLOR 40MG  ONE TABLET DAILY, SAMPLES GIVEN TODAY

## 2012-04-05 ENCOUNTER — Telehealth: Payer: Self-pay | Admitting: Adult Health

## 2012-04-05 ENCOUNTER — Encounter: Payer: Self-pay | Admitting: *Deleted

## 2012-04-05 ENCOUNTER — Telehealth: Payer: Self-pay | Admitting: *Deleted

## 2012-04-05 DIAGNOSIS — I10 Essential (primary) hypertension: Secondary | ICD-10-CM

## 2012-04-05 MED ORDER — AZILSARTAN-CHLORTHALIDONE 40-12.5 MG PO TABS: 1.0000 | ORAL_TABLET | Freq: Every day | ORAL | Status: DC

## 2012-04-05 NOTE — Telephone Encounter (Signed)
Patient needs RX for Edarbyclor 40mg /12.5mg  called to [facility]. / tgs

## 2012-04-05 NOTE — Telephone Encounter (Signed)
Noted pt did not follow up as advised to re-check BP in the week of 03-25-12 through 03-29-12 as well as having labs drawn for that NV date, re-ordered lab for BMP and re-faxed to [facility], mailed letter to pt to advise to have his labs drawn prior to his follow up OV scheduled for 04-21-11 with KL, faxed manually and will follow up to see if pt had labs drawn

## 2012-04-05 NOTE — Telephone Encounter (Signed)
rx sent to pharmacy by e-script per noted samples given on 03-21-12

## 2012-04-12 ENCOUNTER — Telehealth: Payer: Self-pay | Admitting: Cardiology

## 2012-04-12 DIAGNOSIS — I10 Essential (primary) hypertension: Secondary | ICD-10-CM

## 2012-04-12 NOTE — Telephone Encounter (Signed)
PT STATES HE WAS STARTED ON NEW MEDICATION (DOES NOT KNOW WHAT IT IS) AND WAS TOLD TO GO GET BLOOD CHECKED BUT HE HAS NOT TAKEN THIS MEDICATION IN THE LAST SEVEN DAYS, BECAUSE PHARMACY STILL HAD NOT FILLED IT. HE WILL PICK IT UP TODAY.  WHEN DOES HE NEED TO GET LABS DONE?

## 2012-04-12 NOTE — Telephone Encounter (Signed)
Called pt to reiterate the notations on his discharge summary noted on 03-21-12 as follows:  Your physician recommends that you schedule a follow-up appointment in: ONE MONTH WITH KL/SM  Your physician recommends that you schedule a follow-up appointment: NEXT WEEK FOR A BP CHECK WITH THE NURSE  Your physician recommends that you return for lab work in: SAME DAY AS NURSE VISIT APT, LOCATED AT [facility] FROM [place], TAKE SLIPS GIVEN AT YOUR OFFICE VISIT TODAY  Your physician has recommended you make the following change in your medication:  1) STOP TAKING LOZOL (INDAPAMIDE) 2.5MG   2) START TAKING EDARBYCLOR 40MG  ONE TABLET DAILY, SAMPLES GIVEN TODAY    Pt noted he was confused and thought he was to only come back to see KL on 04-19-12, pt advised that he will pick up his medication today and start over, advised going forward we do try and keep samples of that medication in the office, the next time he runs into the issue of the pharmacy not having the medication, he can call us to see if a sample is here and if not another pharmacy can be called to fill the medication in order to prevent him from being without his medication per importance of staying on this, pt understood, this nurse then received verbal order from [facility] for pt to start medication today, have labs drawn in one week and re-schedule pt approximately 10days from today in order for the labs to be back in time for re-scheduled OV, pt understood, new lab slips for BMP faxed manually to [facility], re-scheduled pt for 04-24-12 with KL at 11:20am, pt accepted this apt and was advised he needs to go to [place] [facility] on 04-19-12, pt advised he will implement all instructions given, apt scheduled, KL made aware verbally

## 2012-04-18 ENCOUNTER — Ambulatory Visit: Payer: BC Managed Care – PPO | Admitting: Adult Health

## 2012-04-19 ENCOUNTER — Ambulatory Visit: Payer: BC Managed Care – PPO | Admitting: Adult Health

## 2012-04-19 LAB — BASIC METABOLIC PANEL
BUN: 19 mg/dL (ref 6–23)
CO2: 31 mEq/L (ref 19–32)
Calcium: 9.6 mg/dL (ref 8.4–10.5)
Chloride: 100 mEq/L (ref 96–112)
Creat: 1.19 mg/dL (ref 0.50–1.35)
Glucose, Bld: 98 mg/dL (ref 70–99)
Potassium: 3.9 mEq/L (ref 3.5–5.3)
Sodium: 141 mEq/L (ref 135–145)

## 2012-04-24 ENCOUNTER — Ambulatory Visit (INDEPENDENT_AMBULATORY_CARE_PROVIDER_SITE_OTHER): Payer: BC Managed Care – PPO | Admitting: Adult Health

## 2012-04-24 ENCOUNTER — Encounter: Payer: Self-pay | Admitting: Adult Health

## 2012-04-24 VITALS — BP 129/84 | HR 95 | Ht 72.0 in | Wt 236.0 lb

## 2012-04-24 DIAGNOSIS — I10 Essential (primary) hypertension: Secondary | ICD-10-CM

## 2012-04-24 NOTE — Progress Notes (Deleted)
Name: [name]    DOB: [birth year]  Age: 57 y.o.  MR#: [id number]       PCP:  Lilyan Punt, MD      Insurance: @PAYORNAME @   CC:   No chief complaint on file.   VS BP 129/84  Pulse 95  Ht 6' (1.829 m)  Wt 236 lb (107.049 kg)  BMI 32.01 kg/m2  SpO2 98%  Weights Current Weight  04/24/12 236 lb (107.049 kg)  03/21/12 235 lb 0.6 oz (106.613 kg)  01/18/12 228 lb (103.42 kg)    Blood Pressure  BP Readings from Last 3 Encounters:  04/24/12 129/84  03/21/12 150/80  01/18/12 144/95     Admit date:  (Not on file) Last encounter with RMR:  04/05/2012   Allergy Allergies  Allergen Reactions  . Latex     REACTION: rash    Current Outpatient Prescriptions  Medication Sig Dispense Refill  . aspirin 81 MG tablet Take 81 mg by mouth daily.      . Azilsartan-Chlorthalidone 40-12.5 MG TABS Take 1 tablet by mouth daily.  30 tablet  6  . indapamide (LOZOL) 2.5 MG tablet Take 2.5 mg by mouth every morning.      Marland Kitchen KLOR-CON M10 10 MEQ tablet Take 10 mEq by mouth daily.       . meloxicam (MOBIC) 15 MG tablet       . pantoprazole (PROTONIX) 40 MG tablet Take 40 mg by mouth daily.      . Tamsulosin HCl (FLOMAX) 0.4 MG CAPS Take 0.4 mg by mouth as needed.        Discontinued Meds:   There are no discontinued medications.  Patient Active Problem List  Diagnosis  . GERD (gastroesophageal reflux disease)  . Hx of adenomatous colonic polyps  . Hypertension  . ADD (attention deficit disorder)  . Palpitations  . Exertional dyspnea  . Abdominal  pain    LABS Telephone on 04/12/2012  Component Date Value  . Sodium 04/19/2012 141   . Potassium 04/19/2012 3.9   . Chloride 04/19/2012 100   . CO2 04/19/2012 31   . Glucose, Bld 04/19/2012 98   . BUN 04/19/2012 19   . Creat 04/19/2012 1.19   . Calcium 04/19/2012 9.6      Results for this Opt Visit:     Results for orders placed in visit on 04/12/12  BASIC METABOLIC PANEL      Component Value Range   Sodium 141  135 - 145  mEq/L   Potassium 3.9  3.5 - 5.3 mEq/L   Chloride 100  96 - 112 mEq/L   CO2 31  19 - 32 mEq/L   Glucose, Bld 98  70 - 99 mg/dL   BUN 19  6 - 23 mg/dL   Creat 1.30  8.65 - 7.84 mg/dL   Calcium 9.6  8.4 - 69.6 mg/dL    EKG Orders placed in visit on 12/28/11  . EXERCISE TOLERANCE TEST     Prior Assessment and Plan Problem List as of 04/24/2012          GERD (gastroesophageal reflux disease)   Hx of adenomatous colonic polyps   Hypertension   Last Assessment & Plan Note   03/21/2012 Office Visit Signed 03/21/2012 12:23 PM by Jodelle Gross, NP    I have decided to take him off of indipimide 2.5 and begin him on samples of azilsartan chlorthalidone 40/12.5 mg. He is moderately hypertensive here in the clinic. He will return  to the office for a nurse check next week on this medication.  If he continues to have trouble with compliance due to memory issues, may need to consider a TTS patch for 7 days at time to assist with consistent BP control to help with compliance. I do not see that he has had an echo with history of hypertension and palpitations. Will plan to order this.     ADD (attention deficit disorder)   Palpitations   Last Assessment & Plan Note   03/21/2012 Office Visit Addendum 03/21/2012 12:56 PM by Jodelle Gross, NP    Stress test read by Dr. Diona Browner is read as abnormal. I have discussed this with Dr.Ross on site. We will review the EKG tracings during the stress test. If necessary will plan cardiac cath after review.  ADDENDUM:    Stress test EKG tracings reviewed by Dr.Ross on site. Felt to be a normal stress test. No further ischemic testing is planned.    Exertional dyspnea   Last Assessment & Plan Note   12/26/2011 Office Visit Addendum 12/29/2011 10:04 AM by Kathlen Brunswick, MD    Exertional dyspnea likely related to excessive weight and physical deconditioning.  Graded exercise testing to be performed to evaluate exercise tolerance, exclude exercise hypoxemia and  to exclude myocardial ischemia.     Abdominal  pain       Imaging: No results found.   FRS Calculation: Score not calculated. Missing: Total Cholesterol, HDL

## 2012-04-24 NOTE — Assessment & Plan Note (Signed)
Excellent control of his blood pressure with new medication. He has ADD and finds it difficult to remember to take his medications. His wife is now helping him to remember to take meds with a daily pill minder. He was to have echo on last visit, but this was not completed. I will have him scheduled. BMET has been reviewed with values WNL.  Will see him again in 6 months unless he is symptomatic.

## 2012-04-24 NOTE — Patient Instructions (Addendum)
Your physician recommends that you schedule a follow-up appointment in: 6 months  Your physician has requested that you have an echocardiogram. Echocardiography is a painless test that uses sound waves to create images of your heart. It provides your doctor with information about the size and shape of your heart and how well your heart's chambers and valves are working. This procedure takes approximately one hour. There are no restrictions for this procedure.    

## 2012-04-24 NOTE — Progress Notes (Signed)
   HPI: [name] is a pleasant 57 y/o male patient we are following hypertension and chest discomfort. GXT completed in Sept was negative for ischemia but he did have a hypertensive response. He was placed on samples of azilsartan/chlorthalidone 40/12.5 mg daily with a Rx provided. He is here for follow up. He is without complaint with the exception of mild dizziness at times, but this is transient. He has some occasional chest discomfort relieved with anti-gas medication.   Allergies  Allergen Reactions  . Latex     REACTION: rash    Current Outpatient Prescriptions  Medication Sig Dispense Refill  . aspirin 81 MG tablet Take 81 mg by mouth daily.      . Azilsartan-Chlorthalidone 40-12.5 MG TABS Take 1 tablet by mouth daily.  30 tablet  6  . indapamide (LOZOL) 2.5 MG tablet Take 2.5 mg by mouth every morning.      Marland Kitchen KLOR-CON M10 10 MEQ tablet Take 10 mEq by mouth daily.       . meloxicam (MOBIC) 15 MG tablet       . pantoprazole (PROTONIX) 40 MG tablet Take 40 mg by mouth daily.      . Tamsulosin HCl (FLOMAX) 0.4 MG CAPS Take 0.4 mg by mouth as needed.        Past Medical History  Diagnosis Date  . Hypertension   . GERD (gastroesophageal reflux disease)   . Colon polyps 2011    tubular adenoma by excisional biopsy during colonoscopy  . ADD (attention deficit disorder)     Rx-Adderall  . Degenerative disc disease, cervical   . Palpitations 2003    Holter in 2003: PACs and PVCs; negative stress nuclear test in 2005; right bundle branch block; echo in 2008-mild LVH, otherwise normal; 2008-negative stress nuclear test.  . Prostatitis     prostate calcifications by CT  . Sleep apnea     questionable diagnosis    Past Surgical History  Procedure Date  . Anterior cervical decomp/discectomy fusion   . Knee surgery     rt  . Colonoscopy w/ polypectomy 2011    tubular adenoma    GNF:AOZHYQ of systems complete and found to be negative unless listed above  PHYSICAL EXAM BP  129/84  Pulse 95  Ht 6' (1.829 m)  Wt 236 lb (107.049 kg)  BMI 32.01 kg/m2  SpO2 98%  General: Well developed, well nourished, in no acute distress Head: Eyes PERRLA, No xanthomas.   Normal cephalic and atramatic  Lungs: Clear bilaterally to auscultation and percussion. Heart: HRRR S1 S2, without MRG.  Pulses are 2+ & equal.            No carotid bruit. No JVD.  No abdominal bruits. No femoral bruits. Abdomen: Bowel sounds are positive, abdomen soft and non-tender without masses or                  Hernia's noted. Msk:  Back normal, normal gait. Normal strength and tone for age. Extremities: No clubbing, cyanosis or edema.  DP +1 Neuro: Alert and oriented X 3. Psych:  Good affect, responds appropriately    ASSESSMENT AND PLAN

## 2012-05-01 ENCOUNTER — Ambulatory Visit (HOSPITAL_COMMUNITY)
Admission: RE | Admit: 2012-05-01 | Discharge: 2012-05-01 | Disposition: A | Discharge status: Home / Self Care | Payer: BC Managed Care – PPO | Source: Ambulatory Visit | Attending: Cardiology | Admitting: Cardiology

## 2012-05-01 DIAGNOSIS — K219 Gastro-esophageal reflux disease without esophagitis: Secondary | ICD-10-CM | POA: Insufficient documentation

## 2012-05-01 DIAGNOSIS — R002 Palpitations: Secondary | ICD-10-CM | POA: Insufficient documentation

## 2012-05-01 DIAGNOSIS — Z87891 Personal history of nicotine dependence: Secondary | ICD-10-CM | POA: Insufficient documentation

## 2012-05-01 DIAGNOSIS — I10 Essential (primary) hypertension: Secondary | ICD-10-CM

## 2012-05-01 DIAGNOSIS — F988 Other specified behavioral and emotional disorders with onset usually occurring in childhood and adolescence: Secondary | ICD-10-CM | POA: Insufficient documentation

## 2012-05-01 NOTE — Progress Notes (Signed)
*  PRELIMINARY RESULTS* Echocardiogram 2D Echocardiogram has been performed.  [name] 05/01/2012, 3:09 PM

## 2012-08-30 ENCOUNTER — Other Ambulatory Visit: Payer: Self-pay | Admitting: *Deleted

## 2012-08-30 MED ORDER — TAMSULOSIN HCL 0.4 MG PO CAPS: 0.4000 mg | ORAL_CAPSULE | ORAL | Status: DC | PRN

## 2012-10-14 ENCOUNTER — Encounter: Payer: Self-pay | Admitting: *Deleted

## 2012-10-16 ENCOUNTER — Emergency Department (HOSPITAL_COMMUNITY): Payer: BC Managed Care – PPO

## 2012-10-16 ENCOUNTER — Encounter (HOSPITAL_COMMUNITY): Payer: Self-pay | Admitting: *Deleted

## 2012-10-16 ENCOUNTER — Inpatient Hospital Stay (HOSPITAL_COMMUNITY)
Admission: EM | Admit: 2012-10-16 | Discharge: 2012-10-21 | DRG: 854 | Disposition: A | Discharge status: Home / Self Care | Payer: BC Managed Care – PPO | Attending: Cardiology | Admitting: Cardiology

## 2012-10-16 DIAGNOSIS — I10 Essential (primary) hypertension: Secondary | ICD-10-CM

## 2012-10-16 DIAGNOSIS — I25119 Atherosclerotic heart disease of native coronary artery with unspecified angina pectoris: Secondary | ICD-10-CM | POA: Diagnosis not present

## 2012-10-16 DIAGNOSIS — Z860100 Personal history of colon polyps, unspecified: Secondary | ICD-10-CM

## 2012-10-16 DIAGNOSIS — Z860101 Personal history of adenomatous and serrated colon polyps: Secondary | ICD-10-CM

## 2012-10-16 DIAGNOSIS — Z87891 Personal history of nicotine dependence: Secondary | ICD-10-CM

## 2012-10-16 DIAGNOSIS — I2510 Atherosclerotic heart disease of native coronary artery without angina pectoris: Secondary | ICD-10-CM

## 2012-10-16 DIAGNOSIS — Z7982 Long term (current) use of aspirin: Secondary | ICD-10-CM

## 2012-10-16 DIAGNOSIS — R002 Palpitations: Secondary | ICD-10-CM

## 2012-10-16 DIAGNOSIS — R531 Weakness: Secondary | ICD-10-CM

## 2012-10-16 DIAGNOSIS — R0609 Other forms of dyspnea: Secondary | ICD-10-CM

## 2012-10-16 DIAGNOSIS — Z79899 Other long term (current) drug therapy: Secondary | ICD-10-CM

## 2012-10-16 DIAGNOSIS — K219 Gastro-esophageal reflux disease without esophagitis: Secondary | ICD-10-CM

## 2012-10-16 DIAGNOSIS — R072 Precordial pain: Secondary | ICD-10-CM

## 2012-10-16 DIAGNOSIS — F988 Other specified behavioral and emotional disorders with onset usually occurring in childhood and adolescence: Secondary | ICD-10-CM

## 2012-10-16 DIAGNOSIS — I442 Atrioventricular block, complete: Principal | ICD-10-CM

## 2012-10-16 DIAGNOSIS — R0600 Dyspnea, unspecified: Secondary | ICD-10-CM

## 2012-10-16 DIAGNOSIS — I4510 Unspecified right bundle-branch block: Secondary | ICD-10-CM | POA: Diagnosis present

## 2012-10-16 DIAGNOSIS — Z955 Presence of coronary angioplasty implant and graft: Secondary | ICD-10-CM

## 2012-10-16 DIAGNOSIS — R109 Unspecified abdominal pain: Secondary | ICD-10-CM

## 2012-10-16 DIAGNOSIS — I25118 Atherosclerotic heart disease of native coronary artery with other forms of angina pectoris: Secondary | ICD-10-CM | POA: Diagnosis not present

## 2012-10-16 HISTORY — DX: Angina pectoris, unspecified: I20.9

## 2012-10-16 LAB — BASIC METABOLIC PANEL
BUN: 18 mg/dL (ref 6–23)
CO2: 29 mEq/L (ref 19–32)
Calcium: 9.2 mg/dL (ref 8.4–10.5)
Chloride: 101 mEq/L (ref 96–112)
Creatinine, Ser: 1 mg/dL (ref 0.50–1.35)
GFR calc Af Amer: >90 mL/min (ref >90)
GFR calc non Af Amer: 81 mL/min — ABNORMAL LOW (ref >90)
Glucose, Bld: 95 mg/dL (ref 70–99)
Potassium: 4.2 mEq/L (ref 3.5–5.1)
Sodium: 138 mEq/L (ref 135–145)

## 2012-10-16 LAB — POCT I-STAT TROPONIN I: Troponin i, poc: 0 ng/mL (ref 0.00–0.08)

## 2012-10-16 LAB — CBC WITH DIFFERENTIAL/PLATELET
Basophils Absolute: 0 10*3/uL (ref 0.0–0.1)
Basophils Relative: 0 % (ref 0–1)
Eosinophils Absolute: 0.2 10*3/uL (ref 0.0–0.7)
Eosinophils Relative: 2 % (ref 0–5)
HCT: 46.5 % (ref 39.0–52.0)
Hemoglobin: 16 g/dL (ref 13.0–17.0)
Lymphocytes Relative: 25 % (ref 12–46)
Lymphs Abs: 2.3 10*3/uL (ref 0.7–4.0)
MCH: 30.1 pg (ref 26.0–34.0)
MCHC: 34.4 g/dL (ref 30.0–36.0)
MCV: 87.4 fL (ref 78.0–100.0)
Monocytes Absolute: 0.7 10*3/uL (ref 0.1–1.0)
Monocytes Relative: 7 % (ref 3–12)
Neutro Abs: 5.9 10*3/uL (ref 1.7–7.7)
Neutrophils Relative %: 66 % (ref 43–77)
Platelets: 217 10*3/uL (ref 150–400)
RBC: 5.32 MIL/uL (ref 4.22–5.81)
RDW: 12.9 % (ref 11.5–15.5)
WBC: 9 10*3/uL (ref 4.0–10.5)

## 2012-10-16 LAB — CBC
HCT: 44.4 % (ref 39.0–52.0)
Hemoglobin: 15.5 g/dL (ref 13.0–17.0)
MCH: 30 pg (ref 26.0–34.0)
MCHC: 34.9 g/dL (ref 30.0–36.0)
MCV: 85.9 fL (ref 78.0–100.0)
Platelets: 219 10*3/uL (ref 150–400)
RBC: 5.17 MIL/uL (ref 4.22–5.81)
RDW: 12.9 % (ref 11.5–15.5)
WBC: 8.5 10*3/uL (ref 4.0–10.5)

## 2012-10-16 LAB — MRSA PCR SCREENING: MRSA by PCR: NEGATIVE

## 2012-10-16 LAB — TROPONIN I: Troponin I: <0.3 ng/mL (ref <0.30)

## 2012-10-16 LAB — PROTIME-INR
INR: 0.97 (ref 0.00–1.49)
Prothrombin Time: 12.7 seconds (ref 11.6–15.2)

## 2012-10-16 LAB — MAGNESIUM: Magnesium: 2.3 mg/dL (ref 1.5–2.5)

## 2012-10-16 MED ORDER — SODIUM CHLORIDE 0.9 % IJ SOLN
3.0000 mL | Freq: Two times a day (BID) | INTRAMUSCULAR | Status: DC
Start: 2012-10-16 — End: 2012-10-21
  Administered 2012-10-16 – 2012-10-21 (×9): 3 mL via INTRAVENOUS

## 2012-10-16 MED ORDER — SODIUM CHLORIDE 0.9 % IJ SOLN
3.0000 mL | Freq: Two times a day (BID) | INTRAMUSCULAR | Status: DC
  Administered 2012-10-17 – 2012-10-19 (×4): 3 mL via INTRAVENOUS
  Administered 2012-10-20: 10:00:00 via INTRAVENOUS
  Administered 2012-10-20 – 2012-10-21 (×2): 3 mL via INTRAVENOUS

## 2012-10-16 MED ORDER — ONDANSETRON HCL 4 MG/2ML IJ SOLN: 4.0000 mg | Freq: Four times a day (QID) | INTRAMUSCULAR | Status: DC | PRN

## 2012-10-16 MED ORDER — SODIUM CHLORIDE 0.9 % IV SOLN: 250.0000 mL | INTRAVENOUS | Status: DC | PRN

## 2012-10-16 MED ORDER — ALPRAZOLAM 0.25 MG PO TABS
0.2500 mg | ORAL_TABLET | Freq: Two times a day (BID) | ORAL | Status: DC | PRN
  Administered 2012-10-20: 0.25 mg via ORAL
  Filled 2012-10-16: qty 1

## 2012-10-16 MED ORDER — NITROGLYCERIN 0.4 MG SL SUBL: 0.4000 mg | SUBLINGUAL_TABLET | SUBLINGUAL | Status: DC | PRN

## 2012-10-16 MED ORDER — ASPIRIN 81 MG PO TABS: 81.0000 mg | ORAL_TABLET | Freq: Every day | ORAL | Status: DC

## 2012-10-16 MED ORDER — ACETAMINOPHEN 325 MG PO TABS
650.0000 mg | ORAL_TABLET | ORAL | Status: DC | PRN
  Administered 2012-10-19 – 2012-10-20 (×3): 650 mg via ORAL
  Filled 2012-10-16 (×4): qty 2

## 2012-10-16 MED ORDER — PANTOPRAZOLE SODIUM 40 MG PO TBEC
40.0000 mg | DELAYED_RELEASE_TABLET | Freq: Every day | ORAL | Status: DC
  Administered 2012-10-17 – 2012-10-21 (×5): 40 mg via ORAL
  Filled 2012-10-16 (×5): qty 1

## 2012-10-16 MED ORDER — SODIUM CHLORIDE 0.9 % IJ SOLN: 3.0000 mL | INTRAMUSCULAR | Status: DC | PRN

## 2012-10-16 MED ORDER — TAMSULOSIN HCL 0.4 MG PO CAPS
0.4000 mg | ORAL_CAPSULE | Freq: Every day | ORAL | Status: DC | PRN
  Filled 2012-10-16: qty 1

## 2012-10-16 MED ORDER — ATROPINE SULFATE 0.1 MG/ML IJ SOLN
1.0000 mg | Freq: Once | INTRAMUSCULAR | Status: AC
  Administered 2012-10-16: 1 mg via INTRAVENOUS
  Filled 2012-10-16: qty 10

## 2012-10-16 MED ORDER — ZOLPIDEM TARTRATE 5 MG PO TABS: 5.0000 mg | ORAL_TABLET | Freq: Every evening | ORAL | Status: DC | PRN

## 2012-10-16 MED ORDER — SODIUM CHLORIDE 0.9 % IV SOLN
INTRAVENOUS | Status: DC
  Administered 2012-10-16: 17:00:00 via INTRAVENOUS

## 2012-10-16 MED ORDER — ASPIRIN 81 MG PO CHEW
81.0000 mg | CHEWABLE_TABLET | Freq: Every day | ORAL | Status: DC
  Administered 2012-10-18 – 2012-10-21 (×4): 81 mg via ORAL
  Filled 2012-10-16 (×4): qty 1

## 2012-10-16 MED ORDER — HEPARIN SODIUM (PORCINE) 5000 UNIT/ML IJ SOLN
5000.0000 [IU] | Freq: Three times a day (TID) | INTRAMUSCULAR | Status: DC
  Administered 2012-10-16: 5000 [IU] via SUBCUTANEOUS
  Filled 2012-10-16 (×5): qty 1

## 2012-10-16 NOTE — H&P (Signed)
History and Physical   Patient ID: [name] MRN: [id number], DOB/AGE: Sep 23, 1955   Admit date: 10/16/2012 Date of Consult: 10/16/2012  Primary Physician: Lilyan Punt, MD Primary Cardiologist: R. Dietrich Pates, MD  HPI: [name] is a 57 y.o. male with PMHx s/f palpitations (see below), chronic RBBB, hypertension, GERD, prior h/o tobacco abuse, ? OSA and prior negative ETT (12/2011) who was transferred from [facility] today for dizziness, lightheadedness in the setting of complete heart block.   He was first seen by Dr. Dietrich Pates in 2008 to exclude cardiac disease as a cause of dyspnea nad to provide clearance for an exercise program. Event recorder, echo and nuc stress test were unrevealing. He was seen again in 12/2011 at which time he c/o exertional dyspnea. Follow-up ETT revealed inferior ST depressions, HTNive response and significant motion artifact. Further review by P. Tenny Craw, MD was felt to represent a normal study. HTN was managed thereafter with ARB/chlorthalidone combo, no AVN blockers.    He was in his usual state of health until the morning when he began experiencing lightheadedness. He had similar episodes in the past attributed to low blood pressure. He took serial recordings of his blood pressure and noted them to be low (systolic 90s/diastolic 60s), HR 40s. He also noted substernal chest pressure rated at a 3/10 and constant. There was no particular exertional component. He denies antecedent exertional chest pain, shortness of breath, PND, orthopnea, palpitations or syncope. He called his PCPs office and was advised to present to the emergency department.  In the ED, EKG did confirmed complete HB, HR 30-40s, underlying RBBB, no ST/T changes. He was given atropine with minimal response. Initial trop-I WNL. BMET/CBC unremarkable. Portable CXR w/ mild ventral vascular congestion but no edema, infiltrates or effusions. The patient's presentation overall  findings were discussed with Dr. Jens Som who agreed to transfer the patient from [facility] to [facility].  2D echocardiogram 04/2012: LVEF 55-60%, grade 2 diastolic dysfunction, mild focal basal hypertrophy of the septum, no regional WMAs. Mild aortic root dilatation, trivial MR/TR, no pericardial effusion.   ETT Myoview 11/2006: negative for ischemia, PVCs, couplets; LVEF 59%, inferior soft tissue attenuation.   ETT 12/2011: hypertensive responsive to exercise 210/78, abnormal ST segment changes II, III, aVF and V4 (depression ST 1-1.5 mm). Read by Dr. Tenny Craw and felt to be a normal study with significant artifact.   Problem List: Past Medical History  Diagnosis Date  . Hypertension   . GERD (gastroesophageal reflux disease)   . Colon polyps 2011    tubular adenoma by excisional biopsy during colonoscopy  . ADD (attention deficit disorder)     Rx-Adderall  . Degenerative disc disease, cervical   . Palpitations 2003    Holter in 2003: PACs and PVCs; negative stress nuclear test in 2005; right bundle branch block; echo in 2008-mild LVH, otherwise normal; 2008-negative stress nuclear test.  . Prostatitis     prostate calcifications by CT  . Sleep apnea     questionable diagnosis  . Pneumonia   . Anginal pain     Past Surgical History  Procedure Laterality Date  . Anterior cervical decomp/discectomy fusion    . Knee surgery      rt  . Colonoscopy w/ polypectomy  2011    tubular adenoma  . Esophagogastroduodenoscopy      Gastritis     Allergies:  Allergies  Allergen Reactions  . Latex Rash    Home Medications: Prior to  Admission medications   Medication Sig Start Date End Date Taking? Authorizing Provider  aspirin 81 MG tablet Take 81 mg by mouth daily.   Yes Historical Provider, MD  Azilsartan-Chlorthalidone 40-12.5 MG TABS Take 1 tablet by mouth daily. 04/05/12  Yes Jodelle Gross, NP  ibuprofen (ADVIL,MOTRIN) 200 MG tablet Take 400 mg by mouth every 6 (six)  hours as needed for pain.   Yes Historical Provider, MD  KLOR-CON M10 10 MEQ tablet Take 10 mEq by mouth daily.  12/21/11  Yes Historical Provider, MD  pantoprazole (PROTONIX) 40 MG tablet Take 40 mg by mouth daily.   Yes Historical Provider, MD  tamsulosin (FLOMAX) 0.4 MG CAPS Take 1 capsule (0.4 mg total) by mouth as needed. 08/30/12 08/30/13 Yes Babs Sciara, MD    Inpatient Medications:   Prescriptions prior to admission  Medication Sig Dispense Refill  . aspirin 81 MG tablet Take 81 mg by mouth daily.      . Azilsartan-Chlorthalidone 40-12.5 MG TABS Take 1 tablet by mouth daily.  30 tablet  6  . ibuprofen (ADVIL,MOTRIN) 200 MG tablet Take 400 mg by mouth every 6 (six) hours as needed for pain.      Marland Kitchen KLOR-CON M10 10 MEQ tablet Take 10 mEq by mouth daily.       . pantoprazole (PROTONIX) 40 MG tablet Take 40 mg by mouth daily.      . tamsulosin (FLOMAX) 0.4 MG CAPS Take 1 capsule (0.4 mg total) by mouth as needed.  30 capsule  5    Family History  Problem Relation Age of Onset  . Heart disease Mother   . Hypertension Mother     Carotid disease     History   Social History  . Marital Status: Married    Spouse Name: N/A    Number of Children: 3  . Years of Education: N/A   Occupational History  . Not on file.   Social History Main Topics  . Smoking status: Former Smoker    Types: Cigarettes    Quit date: 12/20/2003  . Smokeless tobacco: Never Used  . Alcohol Use: 0.6 oz/week    1 Shots of liquor per week     Comment: occasional  . Drug Use: No     Comment: 30 + years ago - "crank, marjiuanna, ect."  . Sexually Active: Yes    Birth Control/ Protection: Surgical   Other Topics Concern  . Not on file   Social History Narrative  . No narrative on file     Review of Systems: General: negative for chills, fever, night sweats or weight changes.  Cardiovascular: positive for chest pain, negative for dyspnea on exertion, edema, orthopnea, palpitations, paroxysmal  nocturnal dyspnea or shortness of breath Dermatological: negative for rash Respiratory: negative for cough or wheezing Urologic: negative for hematuria Abdominal:  negative for nausea, vomiting, diarrhea, bright red blood per rectum, melena, or hematemesis Neurologic:  negative for visual changes, syncope, or dizziness All other systems reviewed and are otherwise negative except as noted above.  Physical Exam: Blood pressure 114/44, pulse 75, temperature 99.2 F (37.3 C), temperature source Oral, resp. rate 13, height 6' (1.829 m), weight 101.7 kg (224 lb 3.3 oz), SpO2 97.00%.    General: Well developed, well nourished, in no acute distress. Head: Normocephalic, atraumatic, sclera non-icteric, no xanthomas, nares are without discharge. Neck: Negative for carotid bruits. JVD not elevated. Lungs: Clear bilaterally to auscultation without wheezes, rales, or rhonchi. Breathing is unlabored. Heart:  Bradycardic, with S1 S2. No murmurs, rubs, or gallops appreciated. Abdomen: Soft, non-tender, non-distended with normoactive bowel sounds. No hepatomegaly. No rebound/guarding. No obvious abdominal masses. Msk:  Strength and tone appears normal for age. Extremities: No clubbing, cyanosis or edema.  Distal pedal pulses are 2+ and equal bilaterally. Neuro: Alert and oriented X 3. Moves all extremities spontaneously. Psych:  Responds to questions appropriately with a normal affect.  Labs: Recent Labs     10/16/12  1418  WBC  9.0  HGB  16.0  HCT  46.5  MCV  87.4  PLT  217   Radiology/Studies: Dg Chest Portable 1 View  10/16/2012   *RADIOLOGY REPORT*  Clinical Data: Chest pain and shortness of breath.  PORTABLE CHEST - 1 VIEW  Comparison: 03/11/2012.  Findings: The cardiac silhouette, mediastinal and hilar contours are within normal limits and stable.  There is mild central vascular congestion without overt pulmonary edema.  Linear basilar scarring changes are stable.  The bony thorax is intact.   Cervical fusion hardware is again noted.  IMPRESSION: Mild central vascular congestion but no edema, infiltrates or effusions.   Original Report Authenticated By: Rudie Meyer, M.D.    EKG: complete HB, HR 38, underlying RBBB, no ST/T changes  ASSESSMENT AND PLAN:   57 y.o. male with PMHx s/f palpitations (see below), chronic RBBB, hypertension, GERD, prior h/o tobacco abuse, ? OSA and prior negative ETT (12/2011) who was transferred from [facility] today for dizziness, lightheadedness in the setting of complete heart block.   1. Complete heart block 2. Precordial pain 3. Chronic RBBB 4. Hypertension 5. GERD 6. Question of OSA 7. H/o tobacco abuse   The patient presented to [facility] emergency department after experiencing persistent lightheadedness and 3/10 substernal chest pain today. His heart rate and blood pressure were noted below at home. EKG and telemetry at [facility] did confirm complete heart block. There were no frank ischemic changes. Initial troponin I returned within normal limits. He has had intermittent chest discomfort previously and undergone prior stress testing. The most recent of which was initially suspected to have inferior ST depressions on exertion, however was later treated to motion artifact. He does have significant cardiac risk factors. The concern is for an ischemic mediated process accounting for his complete heart block (question of RCA lesion). The plan will be made to cycle cardiac biomarkers overnight for formal rule out. He will undergo cardiac catheterization early in the morning. This is been arranged and orders have been completed. Should this demonstrate unremarkable findings, the plan will be made to proceed with permanent pacemaker placement. We will also check a TSH, free T4, magnesium and lipid panel in the morning. He is currently hemodynamically stable and denies lightheadedness. NTG SL PRN has been added for chest pain. The RN  has been updated on the plan tonight and tomorrow. Zoll pads will be at the ready should he become hemodynamically unstable. Hold all AVN blockers.    Signed, R. Hurman Horn, PA-C 10/16/2012, 5:56 PM   As above, patient seen and examined. Briefly he is a 57 year old male with a past medical history of hypertension, palpitations, right bundle branch block presenting with complete heart block. Myoview in 2008 showed no ischemia and ejection fraction of 59%. Echocardiogram in January of 2014 showed normal LV function, grade 2 diastolic dysfunction, mildly dilated aortic root, trace mitral and tricuspid regurgitation. Patient developed dizziness earlier today. He checked his pulse and his heart rate was  40. He went to the emergency room and was noted to be in complete heart block. He has also had occasional chest pain in the upper chest. He states this has been present intermittently for 8 years. It occurs when working in the sun and resolves with rest. However it also occurs at other times. He is somewhat vague about this. It is not radiate. He did have chest pain earlier today but is pain-free at present. His electrocardiogram shows complete heart block, right bundle branch block. Initial enzymes are negative. Discussed with Dr. Graciela Husbands. Plan cardiac catheterization tomorrow morning to exclude RCA disease.  The risks and benefits were discussed and the patient agrees to proceed. If negative he will require a pacemaker. Note he is on no AV nodal blocking agents. Keep transcutaneous pacemaker at bedside. His systolic blood pressure is 140. Continue aspirin. ARGUELLO, ROGER 5:56 PM

## 2012-10-16 NOTE — ED Notes (Signed)
Feels weak, light headed.  Onset this am.   Low pulse  And hypotension at home.  Tightness in chest.

## 2012-10-16 NOTE — ED Notes (Signed)
Dr. Lynelle Doctor  Did not want to wait on [facility], she  wanted me to call [facility] to Transport Pt to [facility] rm 2905. EMS called and will send a truck. a I called [facility] back to let them know the situation.

## 2012-10-16 NOTE — ED Provider Notes (Signed)
History    CSN: [id number] Arrival date & time 10/16/12  1341  First MD Initiated Contact with Patient 10/16/12 1359     No chief complaint on file.  (Consider location/radiation/quality/duration/timing/severity/associated sxs/prior Treatment) HPI Patient reports he felt fine this morning. He states he was working outside for short period of time. He states he came in about 3 hours ago and he acutely started feeling weak like he was going to pass out. He states he hadn't taken his blood pressure medicine today and he thought maybe his blood pressure was high. He took his blood pressure and he got 90/50 and 111/58. He states he has a chest pressure and tightness. He states he's been having shortness of breath but is a little bit worse now. His wife reports he has been having chest discomfort with shortness of breath when he exercises. He had a mild episode of sweating that he attributed to being outside in the heat. He denies any nausea or vomiting. He states he's never felt this way before. They report he recently had a new blood pressure medication added that he's been on it a few months. The blood pressure medicine is edarbyclor.  PCP Dr Fletcher Anon Cardiology Dr Dietrich Pates  Past Medical History  Diagnosis Date  . Hypertension   . GERD (gastroesophageal reflux disease)   . Colon polyps 2011    tubular adenoma by excisional biopsy during colonoscopy  . ADD (attention deficit disorder)     Rx-Adderall  . Degenerative disc disease, cervical   . Palpitations 2003    Holter in 2003: PACs and PVCs; negative stress nuclear test in 2005; right bundle branch block; echo in 2008-mild LVH, otherwise normal; 2008-negative stress nuclear test.  . Prostatitis     prostate calcifications by CT  . Sleep apnea     questionable diagnosis  . Pneumonia   . Anginal pain    Past Surgical History  Procedure Laterality Date  . Anterior cervical decomp/discectomy fusion    . Knee surgery      rt  .  Colonoscopy w/ polypectomy  2011    tubular adenoma  . Esophagogastroduodenoscopy      Gastritis   Family History  Problem Relation Age of Onset  . Heart disease Mother   . Hypertension Mother     Carotid disease   History  Substance Use Topics  . Smoking status: Former Smoker    Types: Cigarettes    Quit date: 12/20/2003  . Smokeless tobacco: Never Used  . Alcohol Use: 0.6 oz/week    1 Shots of liquor per week     Comment: occasional  employed lives at home  Lives with wife  Review of Systems  All other systems reviewed and are negative.    Allergies  Latex  Home Medications   No current outpatient prescriptions on file. edarbyclor 40/12.5mg  tab daily   BP 146/65  Pulse 44  Temp(Src) 98.2 F (36.8 C)  Resp 18  Ht 6' (1.829 m)  Wt 224 lb (101.606 kg)  BMI 30.37 kg/m2  SpO2 100%  Vital signs normal except bradycardia   Physical Exam  Nursing note and vitals reviewed. Constitutional: He is oriented to person, place, and time. He appears well-developed and well-nourished.  Non-toxic appearance. He does not appear ill. No distress.  HENT:  Head: Normocephalic and atraumatic.  Right Ear: External ear normal.  Left Ear: External ear normal.  Nose: Nose normal. No mucosal edema or rhinorrhea.  Mouth/Throat: Oropharynx is clear  and moist and mucous membranes are normal. No dental abscesses or edematous.  Eyes: Conjunctivae and EOM are normal. Pupils are equal, round, and reactive to light.  Neck: Normal range of motion and full passive range of motion without pain. Neck supple.  Cardiovascular: Regular rhythm and normal heart sounds.  Bradycardia present.  Exam reveals no gallop and no friction rub.   No murmur heard. Pulmonary/Chest: Effort normal and breath sounds normal. No respiratory distress. He has no wheezes. He has no rhonchi. He has no rales. He exhibits no tenderness and no crepitus.  Abdominal: Soft. Normal appearance and bowel sounds are normal.  He exhibits no distension. There is no tenderness. There is no rebound and no guarding.  Musculoskeletal: Normal range of motion. He exhibits no edema and no tenderness.  Moves all extremities well.   Neurological: He is alert and oriented to person, place, and time. He has normal strength. No cranial nerve deficit.  Skin: Skin is warm, dry and intact. No rash noted. No erythema. No pallor.  Psychiatric: His speech is normal and behavior is normal. His mood appears anxious.    ED Course  Procedures (including critical care time)  Medications  0.9 %  sodium chloride infusion ( Intravenous New Bag/Given 10/16/12 1636)  atropine 0.1 MG/ML injection 1 mg (1 mg Intravenous Given 10/16/12 1438)    Patient given 1 mg atropine IV which transiently increase his heart rate to 51. His blood pressure remained stable in the 140 range. Patient was placed on an external pacemaker.  Patient reports lately he's been having fatigue and dyspnea of exertion and feeling lightheaded. Patient is currently not on any medications that are known to cause bradycardia. Coincidentally his wife also has a pacemaker.  14:31 Dr Jens Som, [facility] accepts in transfer to [facility] CCU  Pt remained with stable BP during his ED visit.   Lab states his blood was hemolyzed and when they came back down to draw it again pt was gone.   Results for orders placed during the hospital encounter of 10/16/12  CBC WITH DIFFERENTIAL      Result Value Range   WBC 9.0  4.0 - 10.5 K/uL   RBC 5.32  4.22 - 5.81 MIL/uL   Hemoglobin 16.0  13.0 - 17.0 g/dL   HCT 16.1  09.6 - 04.5 %   MCV 87.4  78.0 - 100.0 fL   MCH 30.1  26.0 - 34.0 pg   MCHC 34.4  30.0 - 36.0 g/dL   RDW 40.9  81.1 - 91.4 %   Platelets 217  150 - 400 K/uL   Neutrophils Relative % 66  43 - 77 %   Neutro Abs 5.9  1.7 - 7.7 K/uL   Lymphocytes Relative 25  12 - 46 %   Lymphs Abs 2.3  0.7 - 4.0 K/uL   Monocytes Relative 7  3 - 12 %   Monocytes Absolute 0.7  0.1 - 1.0  K/uL   Eosinophils Relative 2  0 - 5 %   Eosinophils Absolute 0.2  0.0 - 0.7 K/uL   Basophils Relative 0  0 - 1 %   Basophils Absolute 0.0  0.0 - 0.1 K/uL  POCT I-STAT TROPONIN I      Result Value Range   Troponin i, poc 0.00  0.00 - 0.08 ng/mL   Comment 3            Laboratory interpretation all normal except    Dg Chest Portable 1 View  10/16/2012   *RADIOLOGY REPORT*  Clinical Data: Chest pain and shortness of breath.  PORTABLE CHEST - 1 VIEW  Comparison: 03/11/2012.  Findings: The cardiac silhouette, mediastinal and hilar contours are within normal limits and stable.  There is mild central vascular congestion without overt pulmonary edema.  Linear basilar scarring changes are stable.  The bony thorax is intact.  Cervical fusion hardware is again noted.  IMPRESSION: Mild central vascular congestion but no edema, infiltrates or effusions.   Original Report Authenticated By: Rudie Meyer, M.D.       Date: 10/16/2012  Rate: 39  Rhythm: 3 degree AV block  QRS Axis: normal  Intervals: normal  ST/T Wave abnormalities: normal  Conduction Disutrbances:right bundle branch block  Narrative Interpretation:   Old EKG Reviewed: changes noted from May 2013 HR was 71, RBBB is old     1. Third degree heart block   2. Weakness    Plan transfer to [facility] for admission  Devoria Albe, MD, FACEP    CRITICAL CARE Performed by: Devoria Albe L Total critical care time: 31 min Critical care time was exclusive of separately billable procedures and treating other patients. Critical care was necessary to treat or prevent imminent or life-threatening deterioration. Critical care was time spent personally by me on the following activities: development of treatment plan with patient and/or surrogate as well as nursing, discussions with consultants, evaluation of patient's response to treatment, examination of patient, obtaining history from patient or surrogate, ordering and performing treatments and  interventions, ordering and review of laboratory studies, ordering and review of radiographic studies, pulse oximetry and re-evaluation of patient's condition.     MDM    Ward Givens, MD 10/16/12 1710

## 2012-10-17 ENCOUNTER — Encounter (HOSPITAL_COMMUNITY): Payer: Self-pay | Admitting: Neurology

## 2012-10-17 ENCOUNTER — Encounter (HOSPITAL_COMMUNITY)
Admission: EM | Disposition: A | Discharge status: Home / Self Care | Payer: Self-pay | Source: Home / Self Care | Attending: Cardiology

## 2012-10-17 DIAGNOSIS — I2510 Atherosclerotic heart disease of native coronary artery without angina pectoris: Secondary | ICD-10-CM

## 2012-10-17 HISTORY — PX: LEFT HEART CATHETERIZATION WITH CORONARY ANGIOGRAM: SHX5451

## 2012-10-17 HISTORY — PX: PERCUTANEOUS CORONARY STENT INTERVENTION (PCI-S): SHX5485

## 2012-10-17 LAB — BASIC METABOLIC PANEL
BUN: 18 mg/dL (ref 6–23)
CO2: 29 mEq/L (ref 19–32)
Calcium: 9.1 mg/dL (ref 8.4–10.5)
Chloride: 103 mEq/L (ref 96–112)
Creatinine, Ser: 1.23 mg/dL (ref 0.50–1.35)
GFR calc Af Amer: 73 mL/min — ABNORMAL LOW (ref >90)
GFR calc non Af Amer: 63 mL/min — ABNORMAL LOW (ref >90)
Glucose, Bld: 88 mg/dL (ref 70–99)
Potassium: 4.9 mEq/L (ref 3.5–5.1)
Sodium: 140 mEq/L (ref 135–145)

## 2012-10-17 LAB — LIPID PANEL
Cholesterol: 149 mg/dL (ref 0–200)
HDL: 44 mg/dL (ref >39)
LDL Cholesterol: 84 mg/dL (ref 0–99)
Total CHOL/HDL Ratio: 3.4 RATIO
Triglycerides: 104 mg/dL (ref <150)
VLDL: 21 mg/dL (ref 0–40)

## 2012-10-17 LAB — T4, FREE: Free T4: 1.23 ng/dL (ref 0.80–1.80)

## 2012-10-17 LAB — TROPONIN I: Troponin I: <0.3 ng/mL (ref <0.30)

## 2012-10-17 LAB — TSH: TSH: 1.128 u[IU]/mL (ref 0.350–4.500)

## 2012-10-17 LAB — POCT ACTIVATED CLOTTING TIME: Activated Clotting Time: 518 seconds

## 2012-10-17 SURGERY — LEFT HEART CATHETERIZATION WITH CORONARY ANGIOGRAM
Anesthesia: LOCAL

## 2012-10-17 MED ORDER — CLOPIDOGREL BISULFATE 75 MG PO TABS
75.0000 mg | ORAL_TABLET | Freq: Every day | ORAL | Status: DC
  Administered 2012-10-18 – 2012-10-21 (×4): 75 mg via ORAL
  Filled 2012-10-17 (×5): qty 1

## 2012-10-17 MED ORDER — HEPARIN (PORCINE) IN NACL 2-0.9 UNIT/ML-% IJ SOLN
INTRAMUSCULAR | Status: AC
  Filled 2012-10-17: qty 1000

## 2012-10-17 MED ORDER — CLOPIDOGREL BISULFATE 300 MG PO TABS
ORAL_TABLET | ORAL | Status: AC
  Filled 2012-10-17: qty 2

## 2012-10-17 MED ORDER — BIVALIRUDIN 250 MG IV SOLR
INTRAVENOUS | Status: AC
  Filled 2012-10-17: qty 250

## 2012-10-17 MED ORDER — SIMETHICONE 80 MG PO CHEW
80.0000 mg | CHEWABLE_TABLET | Freq: Three times a day (TID) | ORAL | Status: DC | PRN
  Administered 2012-10-17 – 2012-10-20 (×2): 80 mg via ORAL
  Filled 2012-10-17 (×2): qty 1

## 2012-10-17 MED ORDER — SODIUM CHLORIDE 0.9 % IV SOLN: INTRAVENOUS | Status: AC

## 2012-10-17 MED ORDER — HEART ATTACK BOUNCING BOOK
Freq: Once | Status: AC
  Administered 2012-10-17: 11:00:00
  Filled 2012-10-17: qty 1

## 2012-10-17 MED ORDER — LIDOCAINE HCL (PF) 1 % IJ SOLN
INTRAMUSCULAR | Status: AC
  Filled 2012-10-17: qty 30

## 2012-10-17 MED ORDER — MIDAZOLAM HCL 2 MG/2ML IJ SOLN
INTRAMUSCULAR | Status: AC
  Filled 2012-10-17: qty 2

## 2012-10-17 MED ORDER — VERAPAMIL HCL 2.5 MG/ML IV SOLN
INTRAVENOUS | Status: AC
Start: 2012-10-17 — End: 2012-10-17
  Filled 2012-10-17: qty 2

## 2012-10-17 MED ORDER — ASPIRIN 81 MG PO CHEW
324.0000 mg | CHEWABLE_TABLET | Freq: Once | ORAL | Status: AC
  Administered 2012-10-17: 324 mg via ORAL
  Filled 2012-10-17: qty 4

## 2012-10-17 MED ORDER — TAMSULOSIN HCL 0.4 MG PO CAPS
0.4000 mg | ORAL_CAPSULE | Freq: Every day | ORAL | Status: DC
  Administered 2012-10-17 – 2012-10-20 (×4): 0.4 mg via ORAL
  Filled 2012-10-17 (×5): qty 1

## 2012-10-17 MED ORDER — SODIUM CHLORIDE 0.9 % IV SOLN
0.2500 mg/kg/h | INTRAVENOUS | Status: DC
  Filled 2012-10-17: qty 250

## 2012-10-17 MED ORDER — NITROGLYCERIN 0.2 MG/ML ON CALL CATH LAB
INTRAVENOUS | Status: AC
  Filled 2012-10-17: qty 1

## 2012-10-17 MED ORDER — FENTANYL CITRATE 0.05 MG/ML IJ SOLN
INTRAMUSCULAR | Status: AC
  Filled 2012-10-17: qty 2

## 2012-10-17 NOTE — Care Management Note (Signed)
    Page 1 of 1   10/17/2012     9:03:27 AM   CARE MANAGEMENT NOTE 10/17/2012  Patient:  [name], [name]   Account Number:  [id number]  Date Initiated:  10/17/2012  Documentation initiated by:  Junius Creamer  Subjective/Objective Assessment:   adm w complete heart block     Action/Plan:   lives w wife, pcp dr Lorin Picket [place]   Anticipated DC Date:     Anticipated DC Plan:        DC Planning Services  CM consult      Choice offered to / List presented to:             Status of service:   Medicare Important Message given?   (If response is "NO", the following Medicare IM given date fields will be blank) Date Medicare IM given:   Date Additional Medicare IM given:    Discharge Disposition:    Per UR Regulation:  Reviewed for med. necessity/level of care/duration of stay  If discussed at Long Length of Stay Meetings, dates discussed:    Comments:

## 2012-10-17 NOTE — Progress Notes (Signed)
Bleeding noted on R groin site; saturated dressing; MD aware; pressure held for 15 minutes; new dressing applied; pt educated; bedrest restarted; will continue to monitor closely and update as needed

## 2012-10-17 NOTE — CV Procedure (Signed)
   Cardiac Catheterization Operative Report  ATWOOD ADCOCK [id number] 7/3/20149:22 AM Lilyan Punt, MD  Procedure Performed:  1. PTCA/DES x 1 mid RCA 2. Angioseal RFA    Operator: Verne Carrow, MD  Indication: 57 yo male presenting with complete heart block. Diagnostic cath this am per Dr. Eden Emms and pt found to have severe stenosis mid RCA. I am asked to perform PCI.                                      Procedure Details: The risks, benefits, complications, treatment options, and expected outcomes were discussed with the patient. The patient and/or family concurred with the proposed plan, giving informed consent prior to the diagnostic procedure. When I entered the case the patient had a 5 French sheath in place in the right femoral artery. He was given a bolus of Angiomax and a drip was started. He was given 600 mg Plavix po x 1. When the ACT was greater than 200, I engaged the RCA with a AL-1 guiding catheter. I then advanced a BMW wire down the RCA. A 2.0 x 12 mm balloon was used to pre-dilate the severe stenosis in the mid RCA. I then carefully positioned a 2.5 x 38 mm Promus Premier DES in the mid RCA. The stent was post-dilated with a 2.75 x 21 mm Fairview balloon x 2. The stenosis was taken from 90% down to 0%. An Angioseal femoral artery closure device was placed in the right femoral artery.  There were no immediate complications. The patient was taken to the recovery area in stable condition.   Impression: 1. Severe stenosis RCA felt to be related to high grade AV block. 2. Successful PTCA/DES x 1 mid RCA  Recommendations: He will need dual anti-platelet therapy with ASA and Plavix for at least one year. Further evaluation of heart block per EP team.        Complications:  None; patient tolerated the procedure well.

## 2012-10-17 NOTE — Interval H&P Note (Signed)
Cath Lab Visit (complete for each Cath Lab visit)  Clinical Evaluation Leading to the Procedure:   ACS: no  Non-ACS:    Anginal Classification: CCS II  Anti-ischemic medical therapy: No Therapy  Non-Invasive Test Results: No non-invasive testing performed  Prior CABG: No previous CABG      History and Physical Interval Note:  10/17/2012 7:35 AM  [name]  has presented today for surgery, with the diagnosis of cad  The various methods of treatment have been discussed with the patient and family. After consideration of risks, benefits and other options for treatment, the patient has consented to  Procedure(s): LEFT HEART CATHETERIZATION WITH CORONARY ANGIOGRAM (N/A) as a surgical intervention .  The patient's history has been reviewed, patient examined, no change in status, stable for surgery.  I have reviewed the patient's chart and labs.  Questions were answered to the patient's satisfaction.     Charlton Haws

## 2012-10-17 NOTE — CV Procedure (Signed)
      Catheterization   Indication: CHB  Procedure: After informed consent and clinical "time out" the right groin was prepped and draped in a sterile fashion.  A 5Fr sheath was placed in the right femoral artery using seldinger technique and local lidocaine.  Standard JL4, JR4 and angled pigtail catheters were used to engage the coronary arteries.  Coronary arteries were visualized in orthogonal views using caudal and cranial angulation.  Medications:   Versed: 4 mg's  Fentanyl: 25 ug's  Coronary Arteries: Right dominant with no anomalies  LM: Normal  LAD:  Normal    D1: normal  D2 normal  Circumflex: normal codominant   OM1: normal  OM2: nornal  RCA: 90 % mid RCA    PDA: nornal  PLA: nornal    Hemodynamics:  Aortic Pressure: 134 / 81 mmHg  LV Pressure: 139 23  mmHg  Impression:  Discussed with Dr CM  Stent RCA and monitor over 48 hours for ? Pacer.  Dr Graciela Husbands aware.  RCA very hard to engage with high left anterior take off.  EZ RAD right finally engaged selectively  Charlton Haws 10/17/2012 8:48 AM

## 2012-10-18 DIAGNOSIS — Z955 Presence of coronary angioplasty implant and graft: Secondary | ICD-10-CM

## 2012-10-18 DIAGNOSIS — I359 Nonrheumatic aortic valve disorder, unspecified: Secondary | ICD-10-CM

## 2012-10-18 DIAGNOSIS — I2510 Atherosclerotic heart disease of native coronary artery without angina pectoris: Secondary | ICD-10-CM

## 2012-10-18 DIAGNOSIS — I442 Atrioventricular block, complete: Secondary | ICD-10-CM

## 2012-10-18 DIAGNOSIS — I25119 Atherosclerotic heart disease of native coronary artery with unspecified angina pectoris: Secondary | ICD-10-CM | POA: Diagnosis not present

## 2012-10-18 DIAGNOSIS — I25118 Atherosclerotic heart disease of native coronary artery with other forms of angina pectoris: Secondary | ICD-10-CM | POA: Diagnosis not present

## 2012-10-18 LAB — CBC
HCT: 43.8 % (ref 39.0–52.0)
Hemoglobin: 15.1 g/dL (ref 13.0–17.0)
MCH: 29.5 pg (ref 26.0–34.0)
MCHC: 34.5 g/dL (ref 30.0–36.0)
MCV: 85.7 fL (ref 78.0–100.0)
Platelets: 187 10*3/uL (ref 150–400)
RBC: 5.11 MIL/uL (ref 4.22–5.81)
RDW: 12.9 % (ref 11.5–15.5)
WBC: 7.4 10*3/uL (ref 4.0–10.5)

## 2012-10-18 LAB — BASIC METABOLIC PANEL
BUN: 17 mg/dL (ref 6–23)
CO2: 30 mEq/L (ref 19–32)
Calcium: 9.2 mg/dL (ref 8.4–10.5)
Chloride: 104 mEq/L (ref 96–112)
Creatinine, Ser: 1.18 mg/dL (ref 0.50–1.35)
GFR calc Af Amer: 77 mL/min — ABNORMAL LOW (ref >90)
GFR calc non Af Amer: 66 mL/min — ABNORMAL LOW (ref >90)
Glucose, Bld: 99 mg/dL (ref 70–99)
Potassium: 4 mEq/L (ref 3.5–5.1)
Sodium: 142 mEq/L (ref 135–145)

## 2012-10-18 MED ORDER — ATROPINE SULFATE 1 MG/ML IJ SOLN
INTRAMUSCULAR | Status: AC
  Filled 2012-10-18: qty 1

## 2012-10-18 MED ORDER — SIMVASTATIN 10 MG PO TABS
10.0000 mg | ORAL_TABLET | Freq: Every day | ORAL | Status: DC
  Administered 2012-10-18 – 2012-10-20 (×3): 10 mg via ORAL
  Filled 2012-10-18 (×4): qty 1

## 2012-10-18 NOTE — Progress Notes (Signed)
Pt complains of feeling weak and dizzy; heart rate 30's; heart block noted on telemetry; family at bedside; MD at bedside; pt and family at bedside; consult to Dr. Ladona Ridgel; will continue to closely monitor

## 2012-10-18 NOTE — Progress Notes (Addendum)
SUBJECTIVE: [name] is post-op day#2 after undergoing PTCA/DES of a high-grade mid-RCA lesion. He has been feeling well and has been walking around the room without difficulty. He denies chest pain, palpitations, shortness of breath, lightheadedness/dizziness, and leg swelling. He has some mild right groin soreness, but otherwise feels well. He had several questions regarding the potential for improved exercise tolerance, as he and his wife have been following a strict diet and exercise regimen. He's been having episodes of forgetfulness over the past several months as well, but denies having frank dementia. He is certainly eager to go home, but understands the importance of continued monitoring of his rhythm. I spoke to him extensively, explaining to him his disease process and recovery.   Filed Vitals:   10/18/12 0600 10/18/12 0700 10/18/12 0730 10/18/12 0800  BP: 143/72 138/80  142/80  Pulse:      Temp:   98.2 F (36.8 C)   TempSrc:   Oral   Resp: 15 19  13   Height:      Weight:      SpO2: 99% 94%  96%    Intake/Output Summary (Last 24 hours) at 10/18/12 0842 Last data filed at 10/18/12 0800  Gross per 24 hour  Intake    510 ml  Output    600 ml  Net    -90 ml    PHYSICAL EXAM General: NAD Neck: No JVD, no thyromegaly or thyroid nodule.  Lungs: Clear to auscultation bilaterally with normal respiratory effort. CV: Nondisplaced PMI.  Heart regular S1/S2, no S3/S4, soft I-II/VI pansystolic murmur.  No peripheral edema.  No carotid bruit.  Normal pedal pulses.  Abdomen: Soft, nontender, no hepatosplenomegaly, no distention.  Neurologic: Alert and oriented x 3.  Psych: Normal affect. Extremities: No clubbing or cyanosis. Right groin mildly tender to palpation, without ecchymosis.  TELEMETRY: Reviewed telemetry, pt in normal sinus bradycardia with a RBBB, HR 40's. While there were no alarms overnight, I was informed by the nurse that during day shifts, he will go in and out  of 3rd degree heart block.  LABS: Basic Metabolic Panel:  Recent Labs  16/10/96 1700 10/17/12 0515 10/18/12 0605  NA 138 140 142  K 4.2 4.9 4.0  CL 101 103 104  CO2 29 29 30   GLUCOSE 95 88 99  BUN 18 18 17   CREATININE 1.00 1.23 1.18  CALCIUM 9.2 9.1 9.2  MG 2.3  --   --    Liver Function Tests: No results found for this basename: AST, ALT, ALKPHOS, BILITOT, PROT, ALBUMIN,  in the last 72 hours No results found for this basename: LIPASE, AMYLASE,  in the last 72 hours CBC:  Recent Labs  10/16/12 1418 10/16/12 1700 10/18/12 0605  WBC 9.0 8.5 7.4  NEUTROABS 5.9  --   --   HGB 16.0 15.5 15.1  HCT 46.5 44.4 43.8  MCV 87.4 85.9 85.7  PLT 217 219 187   Cardiac Enzymes:  Recent Labs  10/16/12 1645 10/17/12 0100  TROPONINI <0.30 <0.30   BNP: No components found with this basename: POCBNP,  D-Dimer: No results found for this basename: DDIMER,  in the last 72 hours Hemoglobin A1C: No results found for this basename: HGBA1C,  in the last 72 hours Fasting Lipid Panel:  Recent Labs  10/17/12 0515  CHOL 149  HDL 44  LDLCALC 84  TRIG 104  CHOLHDL 3.4   Thyroid Function Tests:  Recent Labs  10/16/12 1700  TSH 1.128  Anemia Panel: No results found for this basename: VITAMINB12, FOLATE, FERRITIN, TIBC, IRON, RETICCTPCT,  in the last 72 hours  RADIOLOGY: Dg Chest Portable 1 View  10/16/2012   *RADIOLOGY REPORT*  Clinical Data: Chest pain and shortness of breath.  PORTABLE CHEST - 1 VIEW  Comparison: 03/11/2012.  Findings: The cardiac silhouette, mediastinal and hilar contours are within normal limits and stable.  There is mild central vascular congestion without overt pulmonary edema.  Linear basilar scarring changes are stable.  The bony thorax is intact.  Cervical fusion hardware is again noted.  IMPRESSION: Mild central vascular congestion but no edema, infiltrates or effusions.   Original Report Authenticated By: Rudie Meyer, M.D.   ECG: Sinus rhythm,  RBBB, LVH with repolarization changes     ASSESSMENT AND PLAN: 1. CAD s/p PTCA/DES of a high-grade mid-RCA stenosis, post-op day #1: Pt is symptomatically stable and doing well. Continue ASA and Plavix. Will start Simvastatin 10 mg daily. 2. High-grade AV block: will continue to monitor over the weekend, and have EP evaluate him as well. He is still in sinus bradycardia and not on beta blockers or other AVN-blocking agents. 3. HTN-relatively controlled on Azilsartan-Chlorthalidone. 4. Disposition-would benefit from cardiac rehabilitation in the outpatient setting.  ADDENDUM: Shortly after 10 A.M., [name] went back into 3rd degree heart block (HR 30's), and was symptomatic with it (lightheaded, fatigued). I spoke with him, his wife, and his daughter about the high likelihood of having a pacemaker implanted on Monday (July 7). I also spoke with Dr. Sharrell Ku (EP), and he will see the patient as well. The patient and his family are in agreement with this plan, and are appreciative of the care Mr. Bantz has been receiving.    Prentice Docker, M.D., F.A.C.C.

## 2012-10-18 NOTE — Consult Note (Signed)
Reason for Consult: Persistent CHB  Referring Physician: Dr. Colen Darling R Lumsden is an 57 y.o. male.   HPI: The patient is a 57 yo man who was admitted to the hospital and found to have intermittant CHB, and sob and subsequent catheterization demonstrated 90% RCA stenosis. It was felt that transient ischemia to the AV node was responsible for his intermittant CHB and he underwent successful PCI yesterday. He initially had improved AV conduction but has redeveloped CHB. He did not rule in for MI and his baseline ECG demonstrates Biphasicular block. He feels very poorly with his complete heart block and slow ventricular escape. He is on no AV nodal blocking drugs. No syncope. He has preserved LV function by prior echo but has had no evaluation during this admission.   PMH: Past Medical History  Diagnosis Date  . Hypertension   . GERD (gastroesophageal reflux disease)   . Colon polyps 2011    tubular adenoma by excisional biopsy during colonoscopy  . ADD (attention deficit disorder)     Rx-Adderall  . Degenerative disc disease, cervical   . Palpitations 2003    Holter in 2003: PACs and PVCs; negative stress nuclear test in 2005; right bundle branch block; echo in 2008-mild LVH, otherwise normal; 2008-negative stress nuclear test.  . Prostatitis     prostate calcifications by CT  . Sleep apnea     questionable diagnosis  . Pneumonia   . Anginal pain     PSHX: Past Surgical History  Procedure Laterality Date  . Anterior cervical decomp/discectomy fusion    . Knee surgery      rt  . Colonoscopy w/ polypectomy  2011    tubular adenoma  . Esophagogastroduodenoscopy      Gastritis    FAMHX: Family History  Problem Relation Age of Onset  . Heart disease Mother   . Hypertension Mother     Carotid disease    Social History:  reports that he quit smoking about 8 years ago. His smoking use included Cigarettes. He smoked 0.00 packs per day. He has never used smokeless  tobacco. He reports that he drinks about 0.6 ounces of alcohol per week. He reports that he does not use illicit drugs.  Allergies:  Allergies  Allergen Reactions  . Latex Rash    Medications: reviewed  Dg Chest Portable 1 View  10/16/2012   *RADIOLOGY REPORT*  Clinical Data: Chest pain and shortness of breath.  PORTABLE CHEST - 1 VIEW  Comparison: 03/11/2012.  Findings: The cardiac silhouette, mediastinal and hilar contours are within normal limits and stable.  There is mild central vascular congestion without overt pulmonary edema.  Linear basilar scarring changes are stable.  The bony thorax is intact.  Cervical fusion hardware is again noted.  IMPRESSION: Mild central vascular congestion but no edema, infiltrates or effusions.   Original Report Authenticated By: Rudie Meyer, M.D.    ROS  As stated in the HPI and negative for all other systems.  Physical Exam  Vitals:Blood pressure 138/66, pulse 46, temperature 98.2 F (36.8 C), temperature source Oral, resp. rate 18, height 6' (1.829 m), weight 224 lb 10.4 oz (101.9 kg), SpO2 96.00%.  Well appearing middle aged man, NAD HEENT: Unremarkable Neck:  7 cm JVD, no thyromegally Lungs:  Clear HEART:  Regular brady rhythm, no murmurs, no rubs, no clicks Abd:  soft, positive bowel sounds, no organomegally, no rebound, no guarding Ext:  2 plus pulses, no edema, no cyanosis, no clubbing Skin:  No rashes no nodules Neuro:  CN II through XII intact, motor grossly intact  Tele - nsr with CHB  Assessment/Plan: 1. CHB 2. CAD, s/p PCI, but patient did not have an MI 3. HTN 4. Baseline RBBB with LAD Rec: the patient has symptomatic CHB and did not have an inferior MI. We initially thought that AV node ischemia was the cause of his heart block but he has intrinsic conduction system disease. I have recommended PPM insertion. He does not currently have an indication for temp PM. Will observe closely. Will plan PPM on Monday, though might be  able to perform sooner, particularly if unstable.  Sharlot Gowda TaylorMD 10/18/2012, 10:48 AM

## 2012-10-18 NOTE — Progress Notes (Signed)
  Echocardiogram 2D Echocardiogram has been performed.  [name] FRANCES 10/18/2012, 3:46 PM

## 2012-10-19 DIAGNOSIS — I10 Essential (primary) hypertension: Secondary | ICD-10-CM

## 2012-10-19 DIAGNOSIS — I2510 Atherosclerotic heart disease of native coronary artery without angina pectoris: Secondary | ICD-10-CM

## 2012-10-19 MED ORDER — DOCUSATE SODIUM 100 MG PO CAPS
100.0000 mg | ORAL_CAPSULE | Freq: Two times a day (BID) | ORAL | Status: DC | PRN
  Administered 2012-10-19 – 2012-10-20 (×2): 100 mg via ORAL
  Filled 2012-10-19 (×2): qty 1

## 2012-10-19 NOTE — Progress Notes (Signed)
1025-1100 Did not walk with pt due to HR getting low and has need for pacemaker. Education began. Discussed stent/plavix, NTG use, CRP 2 and left diet for pt to read. Pt has given permission to refer to [facility].  Will complete ed when we see pt and able to ambulate. Luetta Nutting RNBSN

## 2012-10-19 NOTE — Progress Notes (Signed)
SUBJECTIVE:   [name] is post-op day#2 after undergoing PTCA/DES of a high-grade mid-RCA lesion for CHB. Ambulating room without difficulty. Continues wih episodes of CHB with HRs down to 20-30. Feels bad during events but BP stable.  Seen by EP pending PPM on Monday  Filed Vitals:   10/19/12 0300 10/19/12 0400 10/19/12 0500 10/19/12 0600  BP: 115/56 129/76 119/63 103/68  Pulse:      Temp:  97.8 F (36.6 C)    TempSrc:  Oral    Resp: 18 18 18 18   Height:      Weight:      SpO2:  99%      Intake/Output Summary (Last 24 hours) at 10/19/12 0908 Last data filed at 10/18/12 2200  Gross per 24 hour  Intake   1043 ml  Output      0 ml  Net   1043 ml    PHYSICAL EXAM General: NAD. Walking around room Neck: No JVD, no thyromegaly or thyroid nodule.  Lungs: Clear to auscultation bilaterally with normal respiratory effort. CV: Nondisplaced PMI.  Heart regular S1/S2, no S3/S4, 2/6 SEM  No peripheral edema.  No carotid bruit.  Normal pedal pulses.  Abdomen: Soft, nontender, no hepatosplenomegaly, no distention.  Neurologic: Alert and oriented x 3.  Psych: Normal affect. Extremities: No clubbing or cyanosis. Right groin mildly tender to palpation, without ecchymosis.  TELEMETRY: SR 80s with episodes of CHB with low junctional escape 30s  LABS: Basic Metabolic Panel:  Recent Labs  16/10/96 1700 10/17/12 0515 10/18/12 0605  NA 138 140 142  K 4.2 4.9 4.0  CL 101 103 104  CO2 29 29 30   GLUCOSE 95 88 99  BUN 18 18 17   CREATININE 1.00 1.23 1.18  CALCIUM 9.2 9.1 9.2  MG 2.3  --   --    Liver Function Tests: No results found for this basename: AST, ALT, ALKPHOS, BILITOT, PROT, ALBUMIN,  in the last 72 hours No results found for this basename: LIPASE, AMYLASE,  in the last 72 hours CBC:  Recent Labs  10/16/12 1418 10/16/12 1700 10/18/12 0605  WBC 9.0 8.5 7.4  NEUTROABS 5.9  --   --   HGB 16.0 15.5 15.1  HCT 46.5 44.4 43.8  MCV 87.4 85.9 85.7  PLT 217 219  187   Cardiac Enzymes:  Recent Labs  10/16/12 1645 10/17/12 0100  TROPONINI <0.30 <0.30   BNP: No components found with this basename: POCBNP,  D-Dimer: No results found for this basename: DDIMER,  in the last 72 hours Hemoglobin A1C: No results found for this basename: HGBA1C,  in the last 72 hours Fasting Lipid Panel:  Recent Labs  10/17/12 0515  CHOL 149  HDL 44  LDLCALC 84  TRIG 104  CHOLHDL 3.4   Thyroid Function Tests:  Recent Labs  10/16/12 1700  TSH 1.128   Anemia Panel: No results found for this basename: VITAMINB12, FOLATE, FERRITIN, TIBC, IRON, RETICCTPCT,  in the last 72 hours  RADIOLOGY: Dg Chest Portable 1 View  10/16/2012   *RADIOLOGY REPORT*  Clinical Data: Chest pain and shortness of breath.  PORTABLE CHEST - 1 VIEW  Comparison: 03/11/2012.  Findings: The cardiac silhouette, mediastinal and hilar contours are within normal limits and stable.  There is mild central vascular congestion without overt pulmonary edema.  Linear basilar scarring changes are stable.  The bony thorax is intact.  Cervical fusion hardware is again noted.  IMPRESSION: Mild central vascular congestion but  no edema, infiltrates or effusions.   Original Report Authenticated By: Rudie Meyer, M.D.      ASSESSMENT AND PLAN: 1. CAD s/p PTCA/DES of a high-grade mid-RCA stenosis, post-op day #2:Continue ASA and Plavix. 2. Intermittent High-grade AV block with symptomatic bradycardia: plan for PPM on Monday 3. HTN- controlled   Will keep in ICU throughout weekend. No emergent need for TVP. Keep Zoll's at bedside.  Joeli Fenner,MD 9:18 AM

## 2012-10-20 MED ORDER — ALPRAZOLAM 0.25 MG PO TABS
0.2500 mg | ORAL_TABLET | Freq: Once | ORAL | Status: AC
  Administered 2012-10-20: 0.25 mg via ORAL
  Filled 2012-10-20: qty 1

## 2012-10-20 MED ORDER — MAGNESIUM HYDROXIDE 400 MG/5ML PO SUSP
30.0000 mL | Freq: Every day | ORAL | Status: DC | PRN
  Administered 2012-10-20: 30 mL via ORAL
  Filled 2012-10-20: qty 30

## 2012-10-20 NOTE — Progress Notes (Addendum)
SUBJECTIVE:   Mr. Mincey is post-op day #3 after undergoing PTCA/DES of a high-grade mid-RCA lesion for CHB.   Ambulating room without difficulty. Tele reviewed. No episodes of CHB in past 24 hours. + frequent PVCs.   Seen by EP pending PPM on Monday  Filed Vitals:   10/20/12 0008 10/20/12 0434 10/20/12 0736 10/20/12 0745  BP: 146/78 152/83 156/90   Pulse:      Temp: 98.2 F (36.8 C) 98.5 F (36.9 C)  97.8 F (36.6 C)  TempSrc: Oral Oral  Oral  Resp: 18 18 16    Height:      Weight:  101.5 kg (223 lb 12.3 oz)    SpO2: 93% 94% 97% 96%    Intake/Output Summary (Last 24 hours) at 10/20/12 0843 Last data filed at 10/20/12 0800  Gross per 24 hour  Intake   1630 ml  Output      0 ml  Net   1630 ml    PHYSICAL EXAM General: NAD. Walking around room Neck: No JVD, no thyromegaly or thyroid nodule.  Lungs: Clear to auscultation bilaterally with normal respiratory effort. CV: Nondisplaced PMI.  Heart regular S1/S2, no S3/S4, 2/6 SEM  No peripheral edema.  No carotid bruit.  Normal pedal pulses.  Abdomen: Soft, nontender, no hepatosplenomegaly, no distention.  Neurologic: Alert and oriented x 3.  Psych: Normal affect. Extremities: No clubbing or cyanosis. Right groin ok  TELEMETRY: SR 80s with RBBB.  No episodes of CHB in past 24 hours. + frequent PVCs.  LABS: Basic Metabolic Panel:  Recent Labs  40/98/11 0605  NA 142  K 4.0  CL 104  CO2 30  GLUCOSE 99  BUN 17  CREATININE 1.18  CALCIUM 9.2   Liver Function Tests: No results found for this basename: AST, ALT, ALKPHOS, BILITOT, PROT, ALBUMIN,  in the last 72 hours No results found for this basename: LIPASE, AMYLASE,  in the last 72 hours CBC:  Recent Labs  10/18/12 0605  WBC 7.4  HGB 15.1  HCT 43.8  MCV 85.7  PLT 187   Cardiac Enzymes: No results found for this basename: CKTOTAL, CKMB, CKMBINDEX, TROPONINI,  in the last 72 hours BNP: No components found with this basename: POCBNP,  D-Dimer: No  results found for this basename: DDIMER,  in the last 72 hours Hemoglobin A1C: No results found for this basename: HGBA1C,  in the last 72 hours Fasting Lipid Panel: No results found for this basename: CHOL, HDL, LDLCALC, TRIG, CHOLHDL, LDLDIRECT,  in the last 72 hours Thyroid Function Tests: No results found for this basename: TSH, T4TOTAL, FREET3, T3FREE, THYROIDAB,  in the last 72 hours Anemia Panel: No results found for this basename: VITAMINB12, FOLATE, FERRITIN, TIBC, IRON, RETICCTPCT,  in the last 72 hours  RADIOLOGY: Dg Chest Portable 1 View  10/16/2012   *RADIOLOGY REPORT*  Clinical Data: Chest pain and shortness of breath.  PORTABLE CHEST - 1 VIEW  Comparison: 03/11/2012.  Findings: The cardiac silhouette, mediastinal and hilar contours are within normal limits and stable.  There is mild central vascular congestion without overt pulmonary edema.  Linear basilar scarring changes are stable.  The bony thorax is intact.  Cervical fusion hardware is again noted.  IMPRESSION: Mild central vascular congestion but no edema, infiltrates or effusions.   Original Report Authenticated By: Rudie Meyer, M.D.      ASSESSMENT AND PLAN: 1. CAD s/p PTCA/DES of a high-grade mid-RCA stenosis, post-op day #2:Continue ASA and Plavix. 2.  Intermittent High-grade AV block with symptomatic bradycardia: plan for PPM on Monday 3. HTN- controlled   Will keep in ICU. No emergent need for TVP. Keep Zoll's at bedside. Made NPO for tomorrow. EP to see in am.   Truman Hayward 8:43 AM

## 2012-10-21 ENCOUNTER — Encounter (HOSPITAL_COMMUNITY)
Admission: EM | Disposition: A | Discharge status: Home / Self Care | Payer: Self-pay | Source: Home / Self Care | Attending: Cardiology

## 2012-10-21 LAB — BASIC METABOLIC PANEL
BUN: 16 mg/dL (ref 6–23)
CO2: 26 mEq/L (ref 19–32)
Calcium: 9.2 mg/dL (ref 8.4–10.5)
Chloride: 104 mEq/L (ref 96–112)
Creatinine, Ser: 1.02 mg/dL (ref 0.50–1.35)
GFR calc Af Amer: >90 mL/min (ref >90)
GFR calc non Af Amer: 79 mL/min — ABNORMAL LOW (ref >90)
Glucose, Bld: 98 mg/dL (ref 70–99)
Potassium: 3.7 mEq/L (ref 3.5–5.1)
Sodium: 141 mEq/L (ref 135–145)

## 2012-10-21 LAB — CBC
HCT: 44.7 % (ref 39.0–52.0)
Hemoglobin: 15.2 g/dL (ref 13.0–17.0)
MCH: 29 pg (ref 26.0–34.0)
MCHC: 34 g/dL (ref 30.0–36.0)
MCV: 85.3 fL (ref 78.0–100.0)
Platelets: 189 10*3/uL (ref 150–400)
RBC: 5.24 MIL/uL (ref 4.22–5.81)
RDW: 12.9 % (ref 11.5–15.5)
WBC: 6.1 10*3/uL (ref 4.0–10.5)

## 2012-10-21 SURGERY — PERMANENT PACEMAKER INSERTION
Anesthesia: LOCAL

## 2012-10-21 MED ORDER — CLOPIDOGREL BISULFATE 75 MG PO TABS: 75.0000 mg | ORAL_TABLET | Freq: Every day | ORAL | Status: DC

## 2012-10-21 MED ORDER — CHLORHEXIDINE GLUCONATE 4 % EX LIQD
CUTANEOUS | Status: AC
  Filled 2012-10-21: qty 60

## 2012-10-21 MED ORDER — CHLORHEXIDINE GLUCONATE 4 % EX LIQD: 60.0000 mL | Freq: Once | CUTANEOUS | Status: DC

## 2012-10-21 MED ORDER — SIMVASTATIN 10 MG PO TABS: 10.0000 mg | ORAL_TABLET | Freq: Every day | ORAL | Status: DC

## 2012-10-21 MED ORDER — SODIUM CHLORIDE 0.9 % IR SOLN
80.0000 mg | Status: DC
  Filled 2012-10-21: qty 2

## 2012-10-21 MED ORDER — CEFAZOLIN SODIUM-DEXTROSE 2-3 GM-% IV SOLR
2.0000 g | INTRAVENOUS | Status: DC
  Filled 2012-10-21: qty 50

## 2012-10-21 MED ORDER — SODIUM CHLORIDE 0.9 % IV SOLN: INTRAVENOUS | Status: DC

## 2012-10-21 MED FILL — Sodium Chloride IV Soln 0.9%: INTRAVENOUS | Qty: 50 | Status: AC

## 2012-10-21 NOTE — Discharge Summary (Signed)
CARDIOLOGY DISCHARGE SUMMARY    Patient ID: [name],  MRN: [id number], DOB/AGE: [birth year] 58 y.o.  Admit date: 10/16/2012 Discharge date: 10/21/2012  Primary Care Physician: Lilyan Punt, MD Primary Cardiologist: Purvis Sheffield, MD  Primary Discharge Diagnosis:  1. CHB - resolved 2. CAD s/p PCI to RCA  3. Baseline RBBB with LAD  Secondary Discharge Diagnoses:  1. HTN 2. GERD 3. History of tobacco abuse 4. ADD  Procedures This Admission:   1. Cardiac catheterization 10/17/2012 by Dr. Eden Emms Coronary Arteries:  Right dominant with no anomalies  LM: Normal  LAD: Normal  D1: normal  D2 normal  Circumflex: normal codominant  OM1: normal  OM2: nornal  RCA: 90 % mid RCA  PDA: nornal  PLA: nornal  Hemodynamics:  Aortic Pressure: 134 / 81 mmHg LV Pressure: 139 23 mmHg  Impression: Discussed with Dr CM Stent RCA and monitor over 48 hours for ? Pacer. Dr Graciela Husbands aware. RCA very hard to engage with high left anterior take off. EZ RAD right finally engaged selectively. PCI note by Dr. Clifton James Impression:   - Severe stenosis RCA felt to be related to high grade AV block.   - Successful PTCA/DES x 1 mid RCA  Recommendations: He will need dual anti-platelet therapy with ASA and Plavix for at least one year. Further evaluation of heart block per EP team.   2. 2D echocardiogram 10/18/2012 Study Conclusions - Left ventricle: Inferobasal hypokinesis The cavity size was normal. Systolic function was normal. The estimated ejection fraction was 55%. Wall motion was normal; there were no regional wall motion abnormalities. - Aortic valve: Mild regurgitation. - Left atrium: The atrium was mildly dilated. - Atrial septum: No defect or patent foramen ovale was identified.  History and Hospital Course:  [name] is a pleasant 57 year old man with HTN, prior tobacco abuse, chronic RBBB and ? OSA who presented on 10/16/2012 to [facility] with dizziness and chest pain. In  the ED he was found to have CHB with rate in the 30s. He was transferred to [facility] for further evaluation. He was admitted to [facility] CCU on 10/16/2012. He remained hemodynamically stable, not requiring temporary pacing. His CEs were negative. TSH within normal range. He underwent cardiac catheterization on 10/17/2012 with details as outlined above. He was found to have severe RCA stenosis which was successfully treated with DES x 1 to mid RCA. An echo was done which revealed normal LV function. He was observed on telemetry for an additional 3 days. His CHB resolved within 48 hours of PCI. He has not had any further AV block in >48 hours now. He remains hemodynamically stable. He is ambulating without difficulty. He has been seen, examined and deemed stable for discharge today by Dr. Lewayne Bunting. He will wear an event monitor for 30 days as an outpatient. He will see Joni Reining, NP in 1-2 weeks in the office for hospital follow-up. He prefers to follow-up with Dr. Purvis Sheffield as his primary cardiologist which will also be arranged in the next 4-6 weeks.    Discharge Vitals: Blood pressure 145/81, pulse 67, temperature 97.5 F (36.4 C), temperature source Other (Comment), resp. rate 18, height 6' (1.829 m), weight 223 lb 12.3 oz (101.5 kg), SpO2 100.00%.   Labs: Lab Results  Component Value Date   WBC 6.1 10/21/2012   HGB 15.2 10/21/2012   HCT 44.7 10/21/2012   MCV 85.3 10/21/2012   PLT 189 10/21/2012    Recent Labs Lab 10/21/12 0430  NA 141  K 3.7  CL 104  CO2 26  BUN 16  CREATININE 1.02  CALCIUM 9.2  GLUCOSE 98   Lab Results  Component Value Date   TROPONINI <0.30 10/17/2012    Lab Results  Component Value Date   CHOL 149 10/17/2012   Lab Results  Component Value Date   HDL 44 10/17/2012   Lab Results  Component Value Date   LDLCALC 84 10/17/2012   Lab Results  Component Value Date   TRIG 104 10/17/2012   Lab Results  Component Value Date   CHOLHDL 3.4 10/17/2012    Disposition:  The  patient is being discharged in stable condition.  Follow-up: Follow-up Information   Follow up with [facility] Nurse On 10/22/2012. (At 10:00 AM to pick up heart monitor)    Contact information:   [address] [place] [address] [phone]       Follow up with Joni Reining, NP On 11/01/2012. (At 11:40 AM for hospital follow-up)    Contact information:   [address] [place] [address] [phone]      Discharge Medications:    Medication List    STOP taking these medications       Azilsartan-Chlorthalidone 40-12.5 MG Tabs     KLOR-CON M10 10 MEQ tablet  Generic drug:  potassium chloride      TAKE these medications       aspirin 81 MG tablet  Take 81 mg by mouth daily.     clopidogrel 75 MG tablet  Commonly known as:  PLAVIX  Take 1 tablet (75 mg total) by mouth daily with breakfast.     ibuprofen 200 MG tablet  Commonly known as:  ADVIL,MOTRIN  Take 400 mg by mouth every 6 (six) hours as needed for pain.     pantoprazole 40 MG tablet  Commonly known as:  PROTONIX  Take 40 mg by mouth daily.     simvastatin 10 MG tablet  Commonly known as:  ZOCOR  Take 1 tablet (10 mg total) by mouth daily at 6 PM.     tamsulosin 0.4 MG Caps  Commonly known as:  FLOMAX  Take 1 capsule (0.4 mg total) by mouth as needed.       Duration of Discharge Encounter: Greater than 30 minutes including physician time.  Signed, Rick Duff, PA-C 10/21/2012, 8:55 AM

## 2012-10-21 NOTE — Progress Notes (Signed)
Patient has been given discharge education and e-link notified. PIV's and monitor removed. Wife at bedside.

## 2012-10-21 NOTE — Progress Notes (Signed)
Patient: [name] Date of Encounter: 10/21/2012, 7:27 AM Admit date: 10/16/2012     Subjective  [name] is feeling "great" and is eager to go home. He has been ambulating without difficulty. He denies CP, SOB or palpitations.   Objective  Physical Exam: Vitals: BP 145/81  Pulse 67  Temp(Src) 97.8 F (36.6 C) (Oral)  Resp 16  Ht 6' (1.829 m)  Wt 223 lb 12.3 oz (101.5 kg)  BMI 30.34 kg/m2  SpO2 97% General: Well developed, well appearing 57 year old male in no acute distress. Neck: Supple. JVD not elevated. Lungs: Clear bilaterally to auscultation without wheezes, rales, or rhonchi. Breathing is unlabored. Heart: RRR S1 S2 without murmur. No rub, S3 or S4.  Abdomen: Soft, non-distended. Extremities: No clubbing or cyanosis. No edema.  Distal pedal pulses are 2+ and equal bilaterally. Neuro: Alert and oriented X 3. Moves all extremities spontaneously. No focal deficits.  Intake/Output:  Intake/Output Summary (Last 24 hours) at 10/21/12 0727 Last data filed at 10/20/12 1800  Gross per 24 hour  Intake    953 ml  Output      0 ml  Net    953 ml    Inpatient Medications:  . aspirin  81 mg Oral Daily  .  ceFAZolin (ANCEF) IV  2 g Intravenous On Call  . chlorhexidine  60 mL Topical Once  . chlorhexidine  60 mL Topical Once  . chlorhexidine      . clopidogrel  75 mg Oral Q breakfast  . gentamicin irrigation  80 mg Irrigation On Call  . pantoprazole  40 mg Oral Daily  . simvastatin  10 mg Oral q1800  . sodium chloride  3 mL Intravenous Q12H  . sodium chloride  3 mL Intravenous Q12H  . tamsulosin  0.4 mg Oral QPC supper   . sodium chloride Stopped (10/16/12 2100)  . sodium chloride      Labs:  Recent Labs  10/21/12 0430  NA 141  K 3.7  CL 104  CO2 26  GLUCOSE 98  BUN 16  CREATININE 1.02  CALCIUM 9.2    Recent Labs  10/21/12 0430  WBC 6.1  HGB 15.2  HCT 44.7  MCV 85.3  PLT 189    Radiology/Studies: Dg Chest Portable 1 View  10/16/2012    *RADIOLOGY REPORT*  Clinical Data: Chest pain and shortness of breath.  PORTABLE CHEST - 1 VIEW  Comparison: 03/11/2012.  Findings: The cardiac silhouette, mediastinal and hilar contours are within normal limits and stable.  There is mild central vascular congestion without overt pulmonary edema.  Linear basilar scarring changes are stable.  The bony thorax is intact.  Cervical fusion hardware is again noted.  IMPRESSION: Mild central vascular congestion but no edema, infiltrates or effusions.   Original Report Authenticated By: Rudie Meyer, M.D.    Telemetry: SR    Assessment and Plan  1. CHB  2. CAD, s/p PCI to RCA 3. HTN  4. Baseline RBBB with LAD  Mr. Arya had symptomatic CHB in setting of severe RCA stenosis. AV node ischemia appears to have been the cause for his heart block. Since stent placement, his rhythm has improved. He has not had any recurrent AV block in >48 hours.   Dr. Ladona Ridgel to see Signed, Exie Parody  EP Attending  Patient seen and examined. Agree with above. The patient presented with an acute coronary syndrome and heart block. He underwent successful PCI stenting of his  RCA. He is presumed to have had AV Node ischemia. He has had no heart block over the past 48 hours and feels well. He does have intrinsic conduction system disease. He may well ultimately need to undergo PPM as there is a chance that his heart block will return. We will allow him to be discharged today and undergo outpatient cardiac monitoring. I have warned him about the symptoms which may occur which would indicate the need for PPM. We will undergo watchful waiting. I would like to see him back for arrhythmia followup in 4 weeks. He will need followup with a PA/NP sooner in the clinic with an ECG. He will not be discharged on a beta blocker due to his history of heart block.  Leonia Reeves.D.

## 2012-10-22 ENCOUNTER — Ambulatory Visit: Payer: Self-pay | Admitting: Family Medicine

## 2012-10-22 ENCOUNTER — Telehealth: Payer: Self-pay | Admitting: *Deleted

## 2012-10-22 ENCOUNTER — Ambulatory Visit: Payer: BC Managed Care – PPO | Admitting: *Deleted

## 2012-10-22 DIAGNOSIS — I442 Atrioventricular block, complete: Secondary | ICD-10-CM

## 2012-10-22 NOTE — Telephone Encounter (Signed)
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 Purvis Sheffield pt confirmed, please advise

## 2012-10-22 NOTE — Patient Instructions (Addendum)
Your physician recommends that you schedule a follow-up appointment in: TO BE DETERMINED  Your physician has recommended that you wear an event monitor. Event monitors are medical devices that record the heart's electrical activity. Doctors most often [place] these monitors to diagnose arrhythmias. Arrhythmias are problems with the speed or rhythm of the heartbeat. The monitor is a small, portable device. You can wear one while you do your normal daily activities. This is usually used to diagnose what is causing palpitations/syncope (passing out).FOR 30 DAYS, WE WILL CALL YOU WITH THE RESULTS

## 2012-10-23 ENCOUNTER — Telehealth: Payer: Self-pay | Admitting: *Deleted

## 2012-10-23 DIAGNOSIS — R0602 Shortness of breath: Secondary | ICD-10-CM

## 2012-10-23 NOTE — Telephone Encounter (Signed)
Please have pt hold off on returning to full activity, until he has been evaluated on July 18'th, given the heart block he experienced when I saw him in the hospital. I would want to be sure the Event monitor has no documented evidence of abnormal heart rhythms prior to him resuming his normal activity.

## 2012-10-23 NOTE — Telephone Encounter (Signed)
Spoke to pt to advise results/instructions. Pt understood.  

## 2012-10-23 NOTE — Telephone Encounter (Signed)
Pt noted these sxs started yesterday after walking 8th of mile and noted the SOB,weakness and dizzyness, pt sxs resolved as soon as he stopped walking but once returning to walking the sxs returned, pt noted today after walking short distance SOB, dizzyness as well as tightness in his chest notes this feeling all day since the first episode with or without exertion, while at rest he does not feel lightheaded, pt notes hard to describe the feeling in his chest currently closet to describe as heart pressure/ache, spoke with [facility] NP whom advised for the pt to stop walking/exercising until we see him on 11-01-12, we will schedule him to be seen in cardiac rehab, placed order in chart for CR and pt will be contacted once apt is available, also If your signs and symptoms worsen please seek medical attention in your local Emergency Room, do not attempt to drive yourself per further endangerment, call 911 for transport or have someone drive you to the ER as soon as possible. Pt contacted prior to note closing and pt understood all instructions Pt will call back to our office with any further concerns if necessary

## 2012-10-23 NOTE — Telephone Encounter (Signed)
PT had a recent stent placed and she has been winded and weak. He has not been feel good at all. He is scheduled for a f/u appt 11/01/12.

## 2012-11-01 ENCOUNTER — Encounter: Payer: Self-pay | Admitting: Adult Health

## 2012-11-01 ENCOUNTER — Ambulatory Visit (INDEPENDENT_AMBULATORY_CARE_PROVIDER_SITE_OTHER): Payer: BC Managed Care – PPO | Admitting: Adult Health

## 2012-11-01 VITALS — BP 140/89 | HR 82 | Ht 72.0 in | Wt 223.0 lb

## 2012-11-01 DIAGNOSIS — R002 Palpitations: Secondary | ICD-10-CM

## 2012-11-01 DIAGNOSIS — I2510 Atherosclerotic heart disease of native coronary artery without angina pectoris: Secondary | ICD-10-CM

## 2012-11-01 DIAGNOSIS — I10 Essential (primary) hypertension: Secondary | ICD-10-CM

## 2012-11-01 NOTE — Assessment & Plan Note (Signed)
He continues to wear cardiac monitor.  Will remove after another week. He will follow up in our office in one month.

## 2012-11-01 NOTE — Patient Instructions (Signed)
Your physician recommends that you schedule a follow-up appointment in: 1 month. Your physician recommends that you continue on your current medications as directed. Please refer to the Current Medication list given to you today. 

## 2012-11-01 NOTE — Progress Notes (Signed)
HPI: [name] is a 57 year old patient of Dr. Dietrich Pates we are following for ongoing assessment and management of hypertension. The patient had recent hospitalization to [facility] hospital 10/2012, in the setting of complete heart block, and CAD, status post PCI to the RCA. He has a baseline right bundle branch block with LAD. The patient echo was completed during hospitalization revealing normal LV function. Complete heart block which resolved 48 hours after PCI . He was not in need of  pacemaker.      He is exercising daily, waking, doing cardio. He has no complaints of chest pain or fatigue. He is mostly vegetarian in his diet. He has a history of ADD, and often has trouble remembering to take his medications His wife assists him with medications. He is wearing cardiac monitor.  Allergies  Allergen Reactions  . Latex Rash    Current Outpatient Prescriptions  Medication Sig Dispense Refill  . aspirin 81 MG tablet Take 81 mg by mouth daily.      . clopidogrel (PLAVIX) 75 MG tablet Take 1 tablet (75 mg total) by mouth daily with breakfast.  30 tablet  11  . EDARBYCLOR 40-12.5 MG TABS       . ibuprofen (ADVIL,MOTRIN) 200 MG tablet Take 400 mg by mouth every 6 (six) hours as needed for pain.      . pantoprazole (PROTONIX) 40 MG tablet Take 40 mg by mouth daily.      . simvastatin (ZOCOR) 10 MG tablet Take 1 tablet (10 mg total) by mouth daily at 6 PM.  30 tablet  11  . tamsulosin (FLOMAX) 0.4 MG CAPS Take 1 capsule (0.4 mg total) by mouth as needed.  30 capsule  5   No current facility-administered medications for this visit.    Past Medical History  Diagnosis Date  . Hypertension   . GERD (gastroesophageal reflux disease)   . Colon polyps 2011    tubular adenoma by excisional biopsy during colonoscopy  . ADD (attention deficit disorder)     Rx-Adderall  . Degenerative disc disease, cervical   . Palpitations 2003    Holter in 2003: PACs and PVCs; negative stress nuclear test in 2005;  right bundle branch block; echo in 2008-mild LVH, otherwise normal; 2008-negative stress nuclear test.  . Prostatitis     prostate calcifications by CT  . Sleep apnea     questionable diagnosis  . Pneumonia   . Anginal pain     Past Surgical History  Procedure Laterality Date  . Anterior cervical decomp/discectomy fusion    . Knee surgery      rt  . Colonoscopy w/ polypectomy  2011    tubular adenoma  . Esophagogastroduodenoscopy      Gastritis    ZOX:WRUEAV of systems complete and found to be negative unless listed above   PHYSICAL EXAM BP 140/89  Pulse 82  Ht 6' (1.829 m)  Wt 223 lb (101.152 kg)  BMI 30.24 kg/m2  General: Well developed, well nourished, in no acute distress Head: Eyes PERRLA, No xanthomas.   Normal cephalic and atramatic  Lungs: Clear bilaterally to auscultation and percussion. Heart: HRRR S1 S2, without MRG.  Pulses are 2+ & equal.            No carotid bruit. No JVD.  No abdominal bruits. No femoral bruits. Abdomen: Bowel sounds are positive, abdomen soft and non-tender without masses or  Hernia's noted. Msk:  Back normal, normal gait. Normal strength and tone for age. Extremities: No clubbing, cyanosis or edema.  DP +1 Neuro: Alert and oriented X 3. Psych:  Good affect, responds appropriately  EKG: Sinus rhythm rate of 75 bpm. RBBB.  ASSESSMENT AND PLAN

## 2012-11-01 NOTE — Progress Notes (Deleted)
Name: [name]    DOB: Apr 27, 1955  Age: 57 y.o.  MR#: [id number]       PCP:  Lilyan Punt, MD      Insurance: Payor: BLUE CROSS BLUE SHIELD / Plan: BCBS [place] PPO / Product Type: *No Product type* /   CC:    Chief Complaint  Patient presents with  . Hypertension    VS Filed Vitals:   11/01/12 1307  BP: 140/89  Pulse: 82  Height: 6' (1.829 m)  Weight: 223 lb (101.152 kg)    Weights Current Weight  11/01/12 223 lb (101.152 kg)  10/20/12 223 lb 12.3 oz (101.5 kg)  10/20/12 223 lb 12.3 oz (101.5 kg)    Blood Pressure  BP Readings from Last 3 Encounters:  11/01/12 140/89  10/21/12 145/81  10/21/12 145/81     Admit date:  (Not on file) Last encounter with RMR:  Visit date not found   Allergy Latex  Current Outpatient Prescriptions  Medication Sig Dispense Refill  . aspirin 81 MG tablet Take 81 mg by mouth daily.      . clopidogrel (PLAVIX) 75 MG tablet Take 1 tablet (75 mg total) by mouth daily with breakfast.  30 tablet  11  . EDARBYCLOR 40-12.5 MG TABS       . ibuprofen (ADVIL,MOTRIN) 200 MG tablet Take 400 mg by mouth every 6 (six) hours as needed for pain.      . pantoprazole (PROTONIX) 40 MG tablet Take 40 mg by mouth daily.      . simvastatin (ZOCOR) 10 MG tablet Take 1 tablet (10 mg total) by mouth daily at 6 PM.  30 tablet  11  . tamsulosin (FLOMAX) 0.4 MG CAPS Take 1 capsule (0.4 mg total) by mouth as needed.  30 capsule  5   No current facility-administered medications for this visit.    Discontinued Meds:   There are no discontinued medications.  Patient Active Problem List   Diagnosis Date Noted  . Coronary atherosclerosis of native coronary artery 10/18/2012  . S/P drug eluting coronary stent placement 10/18/2012  . Exertional dyspnea 12/26/2011  . Abdominal  pain 12/26/2011  . Hypertension   . ADD (attention deficit disorder)   . Palpitations   . GERD (gastroesophageal reflux disease) 12/20/2011  . Hx of adenomatous colonic polyps 12/20/2011     LABS    Component Value Date/Time   NA 141 10/21/2012 0430   NA 142 10/18/2012 0605   NA 140 10/17/2012 0515   K 3.7 10/21/2012 0430   K 4.0 10/18/2012 0605   K 4.9 10/17/2012 0515   CL 104 10/21/2012 0430   CL 104 10/18/2012 0605   CL 103 10/17/2012 0515   CO2 26 10/21/2012 0430   CO2 30 10/18/2012 0605   CO2 29 10/17/2012 0515   GLUCOSE 98 10/21/2012 0430   GLUCOSE 99 10/18/2012 0605   GLUCOSE 88 10/17/2012 0515   BUN 16 10/21/2012 0430   BUN 17 10/18/2012 0605   BUN 18 10/17/2012 0515   CREATININE 1.02 10/21/2012 0430   CREATININE 1.18 10/18/2012 0605   CREATININE 1.23 10/17/2012 0515   CREATININE 1.19 04/19/2012 1405   CALCIUM 9.2 10/21/2012 0430   CALCIUM 9.2 10/18/2012 0605   CALCIUM 9.1 10/17/2012 0515   GFRNONAA 79* 10/21/2012 0430   GFRNONAA 66* 10/18/2012 0605   GFRNONAA 63* 10/17/2012 0515   GFRAA >90 10/21/2012 0430   GFRAA 77* 10/18/2012 0605   GFRAA 73* 10/17/2012 0515   CMP  Component Value Date/Time   NA 141 10/21/2012 0430   K 3.7 10/21/2012 0430   CL 104 10/21/2012 0430   CO2 26 10/21/2012 0430   GLUCOSE 98 10/21/2012 0430   BUN 16 10/21/2012 0430   CREATININE 1.02 10/21/2012 0430   CREATININE 1.19 04/19/2012 1405   CALCIUM 9.2 10/21/2012 0430   GFRNONAA 79* 10/21/2012 0430   GFRAA >90 10/21/2012 0430       Component Value Date/Time   WBC 6.1 10/21/2012 0430   WBC 7.4 10/18/2012 0605   WBC 8.5 10/16/2012 1700   HGB 15.2 10/21/2012 0430   HGB 15.1 10/18/2012 0605   HGB 15.5 10/16/2012 1700   HCT 44.7 10/21/2012 0430   HCT 43.8 10/18/2012 0605   HCT 44.4 10/16/2012 1700   MCV 85.3 10/21/2012 0430   MCV 85.7 10/18/2012 0605   MCV 85.9 10/16/2012 1700    Lipid Panel     Component Value Date/Time   CHOL 149 10/17/2012 0515   TRIG 104 10/17/2012 0515   HDL 44 10/17/2012 0515   CHOLHDL 3.4 10/17/2012 0515   VLDL 21 10/17/2012 0515   LDLCALC 84 10/17/2012 0515    ABG    Component Value Date/Time   TCO2 28 08/16/2011 0107     Lab Results  Component Value Date   TSH 1.128 10/16/2012   BNP (last 3 results) No results found for this  basename: PROBNP,  in the last 8760 hours Cardiac Panel (last 3 results) No results found for this basename: CKTOTAL, CKMB, TROPONINI, RELINDX,  in the last 72 hours  Iron/TIBC/Ferritin No results found for this basename: iron, tibc, ferritin     EKG Orders placed in visit on 10/22/12  . CARDIAC EVENT MONITOR     Prior Assessment and Plan Problem List as of 11/01/2012     Cardiovascular and Mediastinum   Hypertension   Last Assessment & Plan   04/24/2012 Office Visit Written 04/24/2012 12:05 PM by Jodelle Gross, NP     Excellent control of his blood pressure with new medication. He has ADD and finds it difficult to remember to take his medications. His wife is now helping him to remember to take meds with a daily pill minder. He was to have echo on last visit, but this was not completed. I will have him scheduled. BMET has been reviewed with values WNL.  Will see him again in 6 months unless he is symptomatic.    Coronary atherosclerosis of native coronary artery     Digestive   GERD (gastroesophageal reflux disease)     Other   Hx of adenomatous colonic polyps   ADD (attention deficit disorder)   Palpitations   Last Assessment & Plan   03/21/2012 Office Visit Edited 03/21/2012 12:56 PM by Jodelle Gross, NP     Stress test read by Dr. Diona Browner is read as abnormal. I have discussed this with Dr.Ross on site. We will review the EKG tracings during the stress test. If necessary will plan cardiac cath after review.  ADDENDUM:    Stress test EKG tracings reviewed by Dr.Ross on site. Felt to be a normal stress test. No further ischemic testing is planned.    Exertional dyspnea   Last Assessment & Plan   12/26/2011 Office Visit Edited 12/29/2011 10:04 AM by Kathlen Brunswick, MD     Exertional dyspnea likely related to excessive weight and physical deconditioning.  Graded exercise testing to be performed to evaluate exercise tolerance, exclude exercise hypoxemia  and to exclude  myocardial ischemia.     Abdominal  pain   S/P drug eluting coronary stent placement       Imaging: Dg Chest Portable 1 View  10/16/2012   *RADIOLOGY REPORT*  Clinical Data: Chest pain and shortness of breath.  PORTABLE CHEST - 1 VIEW  Comparison: 03/11/2012.  Findings: The cardiac silhouette, mediastinal and hilar contours are within normal limits and stable.  There is mild central vascular congestion without overt pulmonary edema.  Linear basilar scarring changes are stable.  The bony thorax is intact.  Cervical fusion hardware is again noted.  IMPRESSION: Mild central vascular congestion but no edema, infiltrates or effusions.   Original Report Authenticated By: Rudie Meyer, M.D.

## 2012-11-01 NOTE — Assessment & Plan Note (Signed)
He is doing well. No complaints of chest pain or DOE. He has adopted a healthy lifestyle with low cholesterol and low fat. He is exercising every day. He wants to increase his cardio work out and begin [employer]. I have advised him to increase this slowly and not to do heavy lifting of weights for a least another month.   He is advised to be compliant with Plavix and ASA. He will need to be on this for a minimum of one year.

## 2012-11-01 NOTE — Assessment & Plan Note (Signed)
Moderately controlled. He states that it usually runs in the 130's systolic at home. Would benefit from low dose ACE inhibitor. Will reassess need for this on next visit.

## 2012-11-22 ENCOUNTER — Other Ambulatory Visit: Payer: Self-pay | Admitting: Internal Medicine

## 2012-11-26 ENCOUNTER — Ambulatory Visit (INDEPENDENT_AMBULATORY_CARE_PROVIDER_SITE_OTHER): Payer: BC Managed Care – PPO | Admitting: Cardiovascular Disease

## 2012-11-26 ENCOUNTER — Encounter: Payer: Self-pay | Admitting: Cardiovascular Disease

## 2012-11-26 VITALS — BP 170/90 | HR 73 | Ht 72.0 in | Wt 224.0 lb

## 2012-11-26 DIAGNOSIS — I2510 Atherosclerotic heart disease of native coronary artery without angina pectoris: Secondary | ICD-10-CM

## 2012-11-26 DIAGNOSIS — R002 Palpitations: Secondary | ICD-10-CM

## 2012-11-26 DIAGNOSIS — I442 Atrioventricular block, complete: Secondary | ICD-10-CM

## 2012-11-26 DIAGNOSIS — I10 Essential (primary) hypertension: Secondary | ICD-10-CM

## 2012-11-26 MED ORDER — LISINOPRIL 5 MG PO TABS: 5.0000 mg | ORAL_TABLET | Freq: Every day | ORAL | Status: DC

## 2012-11-26 NOTE — Progress Notes (Signed)
Patient ID: [name], male   DOB: 1955-05-04, 57 y.o.   MRN: [id number]    SUBJECTIVE: [name] is a 57 year old who has a PMH significant for hypertension. The patient had recent hospitalization to [facility] hospital 10/2012, in the setting of complete heart block, and CAD, status post PCI to the RCA. An echo was completed during hospitalization revealing normal LV function. He had complete heart block which resolved 48 hours after PCI, and deemed secondary to AV node ischemia. He was not in need of pacemaker. For these reasons, he was not started on a beta blocker.  He is exercising daily, walking 4-5 miles daily. 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  He's seldom had chest tightness, and usually occurs in the setting of hot temps and humidity. He seldom has palpitations.  He has a history of ADD, and often has trouble remembering to take his medications. His wife assists him with medications.  His monitor showed short bursts of SVT, which may have been sinus tachycardia as he was "digging a hole" at the time.  He checks his BP at home, and it has been running 139-149/70 mmHg.  He eats a lot of fish and fresh vegetables.   Filed Vitals:   11/26/12 1317  BP: 170/90  Pulse: 73  Height: 6' (1.829 m)  Weight: 224 lb (101.606 kg)  SpO2: 96%     PHYSICAL EXAM General: NAD Neck: No JVD, no thyromegaly or thyroid nodule.  Lungs: Clear to auscultation bilaterally with normal respiratory effort. CV: Nondisplaced PMI.  Heart regular S1/S2, no S3/S4, no murmur.  No peripheral edema.  No carotid bruit.  Normal pedal pulses.  Abdomen: Soft, nontender, no hepatosplenomegaly, no distention.  Neurologic: Alert and oriented x 3.  Psych: Normal affect. Extremities: No clubbing or cyanosis.     LABS: Basic Metabolic Panel: No results found for this basename: NA, K, CL, CO2, GLUCOSE, BUN, CREATININE, CALCIUM, MG, PHOS,  in the last 72 hours Liver Function Tests: No results found for  this basename: AST, ALT, ALKPHOS, BILITOT, PROT, ALBUMIN,  in the last 72 hours No results found for this basename: LIPASE, AMYLASE,  in the last 72 hours CBC: No results found for this basename: WBC, NEUTROABS, HGB, HCT, MCV, PLT,  in the last 72 hours Cardiac Enzymes: No results found for this basename: CKTOTAL, CKMB, CKMBINDEX, TROPONINI,  in the last 72 hours BNP: No components found with this basename: POCBNP,  D-Dimer: No results found for this basename: DDIMER,  in the last 72 hours Hemoglobin A1C: No results found for this basename: HGBA1C,  in the last 72 hours Fasting Lipid Panel: No results found for this basename: CHOL, HDL, LDLCALC, TRIG, CHOLHDL, LDLDIRECT,  in the last 72 hours Thyroid Function Tests: No results found for this basename: TSH, T4TOTAL, FREET3, T3FREE, THYROIDAB,  in the last 72 hours Anemia Panel: No results found for this basename: VITAMINB12, FOLATE, FERRITIN, TIBC, IRON, RETICCTPCT,  in the last 72 hours  RADIOLOGY: No results found.    ASSESSMENT AND PLAN: 1. CAD s/p PCI: continue current therapy without the addition of a beta blocker, given his h/o AV node ischemia with complete heart block. Could consider in the future if deemed necessary. Continue ASA, Plavix, and Simvastatin.  2. HTN: uncontrolled today. Will start Lisinopril 5 mg daily. 3. Palpitations: monitor showed short bursts of SVT, which may have been sinus tach and was related to activity. I will not add any rate-slowing agents given his aforementioned  h/o complete heart block. If he continues be symptomatic, I would consider Metoprolol 12.5 mg daily to start.   Prentice Docker, M.D., F.A.C.C.

## 2012-11-26 NOTE — Patient Instructions (Addendum)
Your physician recommends that you schedule a follow-up appointment in: 6 MONTHS WITH Dr. Purvis Sheffield   Your physician has recommended you make the following change in your medication:   1) START LISINOPRIL 5MG  ONCE DAILY

## 2012-11-29 ENCOUNTER — Other Ambulatory Visit: Payer: Self-pay | Admitting: *Deleted

## 2012-11-29 DIAGNOSIS — I442 Atrioventricular block, complete: Secondary | ICD-10-CM

## 2012-12-02 ENCOUNTER — Other Ambulatory Visit: Payer: Self-pay | Admitting: Family Medicine

## 2012-12-16 ENCOUNTER — Emergency Department (HOSPITAL_COMMUNITY): Payer: BC Managed Care – PPO

## 2012-12-16 ENCOUNTER — Inpatient Hospital Stay (HOSPITAL_COMMUNITY)
Admission: EM | Admit: 2012-12-16 | Discharge: 2012-12-18 | DRG: 116 | Disposition: A | Discharge status: Home / Self Care | Payer: BC Managed Care – PPO | Attending: Internal Medicine | Admitting: Internal Medicine

## 2012-12-16 ENCOUNTER — Encounter (HOSPITAL_COMMUNITY): Payer: Self-pay | Admitting: *Deleted

## 2012-12-16 DIAGNOSIS — F988 Other specified behavioral and emotional disorders with onset usually occurring in childhood and adolescence: Secondary | ICD-10-CM

## 2012-12-16 DIAGNOSIS — I25119 Atherosclerotic heart disease of native coronary artery with unspecified angina pectoris: Secondary | ICD-10-CM | POA: Diagnosis present

## 2012-12-16 DIAGNOSIS — Z87891 Personal history of nicotine dependence: Secondary | ICD-10-CM

## 2012-12-16 DIAGNOSIS — Z860101 Personal history of adenomatous and serrated colon polyps: Secondary | ICD-10-CM

## 2012-12-16 DIAGNOSIS — R0600 Dyspnea, unspecified: Secondary | ICD-10-CM

## 2012-12-16 DIAGNOSIS — M5030 Other cervical disc degeneration, unspecified cervical region: Secondary | ICD-10-CM | POA: Diagnosis present

## 2012-12-16 DIAGNOSIS — Z8249 Family history of ischemic heart disease and other diseases of the circulatory system: Secondary | ICD-10-CM

## 2012-12-16 DIAGNOSIS — Z79899 Other long term (current) drug therapy: Secondary | ICD-10-CM

## 2012-12-16 DIAGNOSIS — Z7982 Long term (current) use of aspirin: Secondary | ICD-10-CM

## 2012-12-16 DIAGNOSIS — R109 Unspecified abdominal pain: Secondary | ICD-10-CM

## 2012-12-16 DIAGNOSIS — Z9861 Coronary angioplasty status: Secondary | ICD-10-CM

## 2012-12-16 DIAGNOSIS — R079 Chest pain, unspecified: Secondary | ICD-10-CM

## 2012-12-16 DIAGNOSIS — R0609 Other forms of dyspnea: Secondary | ICD-10-CM

## 2012-12-16 DIAGNOSIS — Z791 Long term (current) use of non-steroidal anti-inflammatories (NSAID): Secondary | ICD-10-CM

## 2012-12-16 DIAGNOSIS — I10 Essential (primary) hypertension: Secondary | ICD-10-CM

## 2012-12-16 DIAGNOSIS — Z91040 Latex allergy status: Secondary | ICD-10-CM

## 2012-12-16 DIAGNOSIS — R001 Bradycardia, unspecified: Secondary | ICD-10-CM

## 2012-12-16 DIAGNOSIS — Z86010 Personal history of colonic polyps: Secondary | ICD-10-CM

## 2012-12-16 DIAGNOSIS — R002 Palpitations: Secondary | ICD-10-CM

## 2012-12-16 DIAGNOSIS — I498 Other specified cardiac arrhythmias: Secondary | ICD-10-CM | POA: Diagnosis present

## 2012-12-16 DIAGNOSIS — Z7902 Long term (current) use of antithrombotics/antiplatelets: Secondary | ICD-10-CM

## 2012-12-16 DIAGNOSIS — I25118 Atherosclerotic heart disease of native coronary artery with other forms of angina pectoris: Secondary | ICD-10-CM | POA: Diagnosis present

## 2012-12-16 DIAGNOSIS — I471 Supraventricular tachycardia, unspecified: Secondary | ICD-10-CM | POA: Diagnosis not present

## 2012-12-16 DIAGNOSIS — I441 Atrioventricular block, second degree: Principal | ICD-10-CM

## 2012-12-16 DIAGNOSIS — G4733 Obstructive sleep apnea (adult) (pediatric): Secondary | ICD-10-CM | POA: Diagnosis present

## 2012-12-16 DIAGNOSIS — K219 Gastro-esophageal reflux disease without esophagitis: Secondary | ICD-10-CM

## 2012-12-16 DIAGNOSIS — Z981 Arthrodesis status: Secondary | ICD-10-CM

## 2012-12-16 DIAGNOSIS — I2510 Atherosclerotic heart disease of native coronary artery without angina pectoris: Secondary | ICD-10-CM

## 2012-12-16 DIAGNOSIS — I200 Unstable angina: Secondary | ICD-10-CM

## 2012-12-16 DIAGNOSIS — Z955 Presence of coronary angioplasty implant and graft: Secondary | ICD-10-CM

## 2012-12-16 HISTORY — DX: Atrioventricular block, complete: I44.2

## 2012-12-16 HISTORY — DX: Atherosclerotic heart disease of native coronary artery without angina pectoris: I25.10

## 2012-12-16 LAB — CBC WITH DIFFERENTIAL/PLATELET
Basophils Absolute: 0 10*3/uL (ref 0.0–0.1)
Basophils Relative: 1 % (ref 0–1)
Eosinophils Absolute: 0.2 10*3/uL (ref 0.0–0.7)
Eosinophils Relative: 3 % (ref 0–5)
HCT: 45.8 % (ref 39.0–52.0)
Hemoglobin: 15.2 g/dL (ref 13.0–17.0)
Lymphocytes Relative: 32 % (ref 12–46)
Lymphs Abs: 2.1 10*3/uL (ref 0.7–4.0)
MCH: 29.5 pg (ref 26.0–34.0)
MCHC: 33.2 g/dL (ref 30.0–36.0)
MCV: 88.8 fL (ref 78.0–100.0)
Monocytes Absolute: 0.6 10*3/uL (ref 0.1–1.0)
Monocytes Relative: 9 % (ref 3–12)
Neutro Abs: 3.6 10*3/uL (ref 1.7–7.7)
Neutrophils Relative %: 55 % (ref 43–77)
Platelets: 217 10*3/uL (ref 150–400)
RBC: 5.16 MIL/uL (ref 4.22–5.81)
RDW: 13.3 % (ref 11.5–15.5)
WBC: 6.4 10*3/uL (ref 4.0–10.5)

## 2012-12-16 LAB — BASIC METABOLIC PANEL
BUN: 22 mg/dL (ref 6–23)
CO2: 27 mEq/L (ref 19–32)
Calcium: 9.7 mg/dL (ref 8.4–10.5)
Chloride: 105 mEq/L (ref 96–112)
Creatinine, Ser: 1.12 mg/dL (ref 0.50–1.35)
GFR calc Af Amer: 82 mL/min — ABNORMAL LOW (ref >90)
GFR calc non Af Amer: 71 mL/min — ABNORMAL LOW (ref >90)
Glucose, Bld: 149 mg/dL — ABNORMAL HIGH (ref 70–99)
Potassium: 3.7 mEq/L (ref 3.5–5.1)
Sodium: 141 mEq/L (ref 135–145)

## 2012-12-16 LAB — PROTIME-INR
INR: 0.95 (ref 0.00–1.49)
Prothrombin Time: 12.5 seconds (ref 11.6–15.2)

## 2012-12-16 LAB — TROPONIN I: Troponin I: <0.3 ng/mL (ref <0.30)

## 2012-12-16 LAB — APTT: aPTT: 31 seconds (ref 24–37)

## 2012-12-16 MED ORDER — HEPARIN (PORCINE) IN NACL 100-0.45 UNIT/ML-% IJ SOLN
1400.0000 [IU]/h | INTRAMUSCULAR | Status: DC
  Administered 2012-12-16 – 2012-12-17 (×2): 1200 [IU]/h via INTRAVENOUS
  Administered 2012-12-17: 1400 [IU]/h via INTRAVENOUS
  Filled 2012-12-16 (×2): qty 250

## 2012-12-16 MED ORDER — ASPIRIN 81 MG PO CHEW
324.0000 mg | CHEWABLE_TABLET | Freq: Once | ORAL | Status: AC
  Administered 2012-12-16: 324 mg via ORAL
  Filled 2012-12-16: qty 4

## 2012-12-16 MED ORDER — HEPARIN BOLUS VIA INFUSION
4000.0000 [IU] | Freq: Once | INTRAVENOUS | Status: AC
  Administered 2012-12-16: 4000 [IU] via INTRAVENOUS

## 2012-12-16 NOTE — ED Notes (Signed)
Chest pain, irregular pulse.  Sob, nausea

## 2012-12-16 NOTE — ED Provider Notes (Signed)
CSN: [id number]     Arrival date & time 12/16/12  2103 History   First MD Initiated Contact with Patient 12/16/12 2159     Chief Complaint  Patient presents with  . Chest Pain    HPI Pt was seen at 2220. Per pt and his wife, c/o gradual onset and persistence of multiple intermittent episodes of "irregular heart rate" as well as chest "pain" for the past 4 days. Pt states he "feels my pulse slow down" and when he checks it "it's 40."  Pt states his HR will then spontaneously come back up into 70's to 100's. Pt states he has hx of same in 10/2012, dx CHB and ACS; tx with PCI to RCA. Pt states he has been off "all the medicines they thought was slowing my HR down."  Pt states his low HR has been associated with generalized fatigue and lightheadedness. Pt states also for the past 4 days he has been having intermittent chest "tightness" and "burning," esp with exertion. Has been associated with SOB, diaphoresis and nausea. Pt states he had an episode of these symptoms today when he was "outside mowing the lawn." Pt states he rested with improvement in his symptoms. Denies cough, no palpitations/racing HR, no abd pain, no vomiting/diarrhea, no back pain, no syncope.    Past Medical History  Diagnosis Date  . Hypertension   . GERD (gastroesophageal reflux disease)   . Colon polyps 2011    tubular adenoma by excisional biopsy during colonoscopy  . ADD (attention deficit disorder)     Rx-Adderall  . Degenerative disc disease, cervical   . Palpitations 2003    Holter in 2003: PACs and PVCs; negative stress nuclear test in 2005; right bundle branch block; echo in 2008-mild LVH, otherwise normal; 2008-negative stress nuclear test.  . Prostatitis     prostate calcifications by CT  . Sleep apnea     questionable diagnosis  . Pneumonia   . Anginal pain   . Complete heart block   . Coronary artery disease    Past Surgical History  Procedure Laterality Date  . Anterior cervical decomp/discectomy fusion     . Knee surgery      rt  . Colonoscopy w/ polypectomy  2011    tubular adenoma  . Esophagogastroduodenoscopy      Gastritis   Family History  Problem Relation Age of Onset  . Heart disease Mother   . Hypertension Mother     Carotid disease   History  Substance Use Topics  . Smoking status: Former Smoker    Types: Cigarettes    Quit date: 12/20/2003  . Smokeless tobacco: Never Used  . Alcohol Use: 0.6 oz/week    1 Shots of liquor per week     Comment: occasional    Review of Systems ROS: Statement: All systems negative except as marked or noted in the HPI; Constitutional: Negative for fever and chills. ; ; Eyes: Negative for eye pain, redness and discharge. ; ; ENMT: Negative for ear pain, hoarseness, nasal congestion, sinus pressure and sore throat. ; ; Cardiovascular: +CP, "irregular HR," SOB, diaphoresis. Negative for palpitations and peripheral edema. ; ; Respiratory: Negative for cough, wheezing and stridor. ; ; Gastrointestinal: +nausea. Negative for vomiting, diarrhea, abdominal pain, blood in stool, hematemesis, jaundice and rectal bleeding. . ; ; Genitourinary: Negative for dysuria, flank pain and hematuria. ; ; Musculoskeletal: Negative for back pain and neck pain. Negative for swelling and trauma.; ; Skin: Negative for pruritus, rash, abrasions,  blisters, bruising and skin lesion.; ; Neuro: +lightheadedness. Negative for headache and neck stiffness. Negative for altered level of consciousness , altered mental status, extremity weakness, paresthesias, involuntary movement, seizure and syncope.     Allergies  Latex  Home Medications   Current Outpatient Rx  Name  Route  Sig  Dispense  Refill  . aspirin EC 81 MG tablet   Oral   Take 81 mg by mouth daily.         . clopidogrel (PLAVIX) 75 MG tablet   Oral   Take 1 tablet (75 mg total) by mouth daily with breakfast.   30 tablet   11   . lisinopril (PRINIVIL,ZESTRIL) 5 MG tablet   Oral   Take 1 tablet (5 mg  total) by mouth daily.   90 tablet   1   . pantoprazole (PROTONIX) 40 MG tablet      take 1 tablet by mouth once daily   30 tablet   5   . simvastatin (ZOCOR) 10 MG tablet   Oral   Take 1 tablet (10 mg total) by mouth daily at 6 PM.   30 tablet   11   . tamsulosin (FLOMAX) 0.4 MG CAPS   Oral   Take 1 capsule (0.4 mg total) by mouth as needed.   30 capsule   5   . ibuprofen (ADVIL,MOTRIN) 200 MG tablet   Oral   Take 400 mg by mouth every 6 (six) hours as needed for pain.          BP 189/82  Pulse 69  Temp(Src) 97.9 F (36.6 C) (Oral)  Resp 20  Ht 6' (1.829 m)  Wt 225 lb (102.059 kg)  BMI 30.51 kg/m2  SpO2 100% Physical Exam 2225: Physical examination:  Nursing notes reviewed; Vital signs and O2 SAT reviewed;  Constitutional: Well developed, Well nourished, Well hydrated, In no acute distress; Head:  Normocephalic, atraumatic; Eyes: EOMI, PERRL, No scleral icterus; ENMT: Mouth and pharynx normal, Mucous membranes moist; Neck: Supple, Full range of motion, No lymphadenopathy; Cardiovascular: Regular rate and rhythm, No gallop; Respiratory: Breath sounds clear & equal bilaterally, No rales, rhonchi, wheezes.  Speaking full sentences with ease, Normal respiratory effort/excursion; Chest: Nontender, Movement normal; Abdomen: Soft, Nontender, Nondistended, Normal bowel sounds; Genitourinary: No CVA tenderness; Extremities: Pulses normal, No tenderness, No edema, No calf edema or asymmetry.; Neuro: AA&Ox3, Major CN grossly intact.  Speech clear. No gross focal motor or sensory deficits in extremities.; Skin: Color normal, Warm, Dry.   ED Course  Procedures     MDM  MDM Reviewed: previous chart, nursing note and vitals Reviewed previous: labs and ECG Interpretation: labs, ECG and x-ray       Date: 12/16/2012  Rate: 75  Rhythm: normal sinus rhythm  QRS Axis: normal  Intervals: normal  ST/T Wave abnormalities: normal  Conduction Disutrbances:right bundle branch  block  Narrative Interpretation:   Old EKG Reviewed: changes noted; previous EKG dated 10/18/2012 with LAFB.  Results for orders placed during the hospital encounter of 12/16/12  BASIC METABOLIC PANEL      Result Value Range   Sodium 141  135 - 145 mEq/L   Potassium 3.7  3.5 - 5.1 mEq/L   Chloride 105  96 - 112 mEq/L   CO2 27  19 - 32 mEq/L   Glucose, Bld 149 (*) 70 - 99 mg/dL   BUN 22  6 - 23 mg/dL   Creatinine, Ser 2.13  0.50 - 1.35 mg/dL  Calcium 9.7  8.4 - 10.5 mg/dL   GFR calc non Af Amer 71 (*) >90 mL/min   GFR calc Af Amer 82 (*) >90 mL/min  CBC WITH DIFFERENTIAL      Result Value Range   WBC 6.4  4.0 - 10.5 K/uL   RBC 5.16  4.22 - 5.81 MIL/uL   Hemoglobin 15.2  13.0 - 17.0 g/dL   HCT 16.1  09.6 - 04.5 %   MCV 88.8  78.0 - 100.0 fL   MCH 29.5  26.0 - 34.0 pg   MCHC 33.2  30.0 - 36.0 g/dL   RDW 40.9  81.1 - 91.4 %   Platelets 217  150 - 400 K/uL   Neutrophils Relative % 55  43 - 77 %   Neutro Abs 3.6  1.7 - 7.7 K/uL   Lymphocytes Relative 32  12 - 46 %   Lymphs Abs 2.1  0.7 - 4.0 K/uL   Monocytes Relative 9  3 - 12 %   Monocytes Absolute 0.6  0.1 - 1.0 K/uL   Eosinophils Relative 3  0 - 5 %   Eosinophils Absolute 0.2  0.0 - 0.7 K/uL   Basophils Relative 1  0 - 1 %   Basophils Absolute 0.0  0.0 - 0.1 K/uL  TROPONIN I      Result Value Range   Troponin I <0.30  <0.30 ng/mL   Dg Chest Portable 1 View  12/16/2012   *RADIOLOGY REPORT*  Clinical Data: Chest pain  PORTABLE CHEST - 1 VIEW  Comparison: Prior radiograph from 10/16/2012  Findings: Cardiac and mediastinal silhouettes are unchanged.  The lungs are hypoinflated.  Bibasilar linear subsegmental atelectasis is present, similar to prior.  No airspace consolidation, pleural effusion, or overt pulmonary edema.  No pneumothorax.  Osseous structures are unchanged.  IMPRESSION: Hypoinflation with bibasilar atelectasis.  No acute cardiopulmonary process.   Original Report Authenticated By: Rise Mu, M.D.      2245:  Pt currently denies CP. Pt's HR mostly in 70's but will decrease to 40's intermittently. Difficulty to discern if NSR vs CHB on monitor. Unable to capture on EKG due to its quick spontaneous resolution. Will dose ASA and IV heparin due to concern re: ACS.  T/C to Cardiology Dr. Gala Romney, case discussed, including:  HPI, pertinent PM/SHx, VS/PE, dx testing, ED course and treatment:  Agreeable to admit at [facility], requests to write temporary orders, obtain stepdown bed. Dx and testing d/w pt and family.  Questions answered.  Verb understanding, agreeable to admit/transfer to [facility].           Laray Anger, DO 12/18/12 1912

## 2012-12-16 NOTE — Progress Notes (Addendum)
ANTICOAGULATION CONSULT NOTE - Initial Consult  Pharmacy Consult for Heparin Indication: chest pain/ACS  Allergies  Allergen Reactions  . Latex Rash    Patient Measurements: Height: 6' (182.9 cm) Weight: 225 lb (102.059 kg) IBW/kg (Calculated) : 77.6 Heparin Dosing Weight: 98 kg Vital Signs: Temp: 97.9 F (36.6 C) (09/01 2109) Temp src: Oral (09/01 2109) BP: 177/80 mmHg (09/01 2324) Pulse Rate: 70 (09/01 2324)  Labs:  Recent Labs  12/16/12 2122 12/16/12 2308  HGB 15.2  --   HCT 45.8  --   PLT 217  --   APTT  --  31  LABPROT  --  12.5  INR  --  0.95  CREATININE 1.12  --   TROPONINI <0.30  --     Estimated Creatinine Clearance: 88.9 ml/min (by C-G formula based on Cr of 1.12).   Medical History: Past Medical History  Diagnosis Date  . Hypertension   . GERD (gastroesophageal reflux disease)   . Colon polyps 2011    tubular adenoma by excisional biopsy during colonoscopy  . ADD (attention deficit disorder)     Rx-Adderall  . Degenerative disc disease, cervical   . Palpitations 2003    Holter in 2003: PACs and PVCs; negative stress nuclear test in 2005; right bundle branch block; echo in 2008-mild LVH, otherwise normal; 2008-negative stress nuclear test.  . Prostatitis     prostate calcifications by CT  . Sleep apnea     questionable diagnosis  . Pneumonia   . Anginal pain   . Complete heart block   . Coronary artery disease     Medications:  Scheduled:    Assessment: Labs reviewed PTA medications reviewed Ok for protocol  Goal of Therapy:  Heparin level 0.3-0.7 units/ml Monitor platelets by anticoagulation protocol: Yes   Plan:  Heparin 4000 Unit bolus, then 1200 Units/hr (12 units/kg/hour) Heparin level in 6 to 8 hours, then daily Labs per protocol  Raquel James, Asami Lambright Bennett 12/16/2012,11:32 PM

## 2012-12-16 NOTE — H&P (Signed)
Primary cardiologist: Purvis Sheffield  HPI:  [name] is a 57 year old with a h/o hypertension, RBBB, CAD, ADD and intermittent CHB.  He was hospitalized in July 2014, in the setting of complete heart block. Underwent cath which showed single vessel CAD with 90% RCA lesion. (otherwise normal) with normal EF. Underwent PCI with Promus DES. CHB resolved with stenting. An echo was completed during hospitalization revealing normal LV function.   He followoed up with Dr. Purvis Sheffield a few weeks ago. He was doing very well - walking 4-5 miles daily. 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 Minimal chest tightness, and usually occurs in the setting of hot temps and humidity. He had a monitor which showed short bursts of SVT, which may have been sinus tachycardia as he was "digging a hole" at the time. No CHB. Started lisinopril 5mg  daily.   Since starting lisinopril hasn't felt very well. [place] and had some chest tightness. Describes as a burning pressure. Happened every time he exerted himself. Was checking BP and HR. SBP 170. Said HR kept dipping down into 40s.   Came to ER. CP free. ECG with RBBB with HR in 70s. No acute changes. Troponin normal.  ER physician reported witnessing several transient dips in his HR to 40s on tele but these were not captured. No syncope or presyncope.   On monitor here HR in 50s with sinus brady. SBP 200. Hasn't missed Plavix.    Review of Systems:     Cardiac Review of Systems: {Y] = yes [ ]  = no  Chest Pain [ y   ]  Resting SOB [   ] Exertional SOB  [ y ]  Orthopnea [  ]   Pedal Edema [   ]    Palpitations [  ] Syncope  [  ]   Presyncope [   ]  General Review of Systems: [Y] = yes [  ]=no Constitional: recent weight change [  ]; anorexia [  ]; fatigue [  ]; nausea [  ]; night sweats [  ]; fever [  ]; or chills [  ];                                                                                                                                           Dental: poor dentition[  ];   Eye : blurred vision [  ]; diplopia [   ]; vision changes [  ];  Amaurosis fugax[  ]; Resp: cough [  ];  wheezing[  ];  hemoptysis[  ]; shortness of breath[  ]; paroxysmal nocturnal dyspnea[  ]; dyspnea on exertion[  ]; or orthopnea[  ];  GI:  gallstones[  ], vomiting[  ];  dysphagia[  ]; melena[  ];  hematochezia [  ]; heartburn[  ];   GU: kidney stones [  ];  hematuria[  ];   dysuria [  ];  nocturia[  ];  history of     obstruction [  ]y;                 Skin: rash, swelling[  ];, hair loss[  ];  peripheral edema[  ];  or itching[  ]; Musculosketetal: myalgias[  ];  joint swelling[  ];  joint erythema[  ];  joint pain[  y];  back pain[  ];  Heme/Lymph: bruising[  ];  bleeding[  ];  anemia[  ];  Neuro: TIA[  ];  headaches[  ];  stroke[  ];  vertigo[  ];  seizures[  ];   paresthesias[  ];  difficulty walking[  ];  Psych:depression[  ]; anxiety[  ];  Endocrine: diabetes[  ];  thyroid dysfunction[  ];  Other:  Past Medical History  Diagnosis Date  . Hypertension   . GERD (gastroesophageal reflux disease)   . Colon polyps 2011    tubular adenoma by excisional biopsy during colonoscopy  . ADD (attention deficit disorder)     Rx-Adderall  . Degenerative disc disease, cervical   . Palpitations 2003    Holter in 2003: PACs and PVCs; negative stress nuclear test in 2005; right bundle branch block; echo in 2008-mild LVH, otherwise normal; 2008-negative stress nuclear test.  . Prostatitis     prostate calcifications by CT  . Sleep apnea     questionable diagnosis  . Pneumonia   . Anginal pain   . Complete heart block   . Coronary artery disease      (Not in a hospital admission)   Allergies  Allergen Reactions  . Latex Rash    History   Social History  . Marital Status: Married    Spouse Name: N/A    Number of Children: 3  . Years of Education: N/A   Occupational History  . Not on file.   Social History Main Topics  . Smoking  status: Former Smoker    Types: Cigarettes    Quit date: 12/20/2003  . Smokeless tobacco: Never Used  . Alcohol Use: 0.6 oz/week    1 Shots of liquor per week     Comment: occasional  . Drug Use: No     Comment: 30 + years ago - "crank, marjiuanna, ect."  . Sexual Activity: Yes    Birth Control/ Protection: Surgical   Other Topics Concern  . Not on file   Social History Narrative  . No narrative on file    Family History  Problem Relation Age of Onset  . Heart disease Mother   . Hypertension Mother     Carotid disease    PHYSICAL EXAM: Filed Vitals:   12/16/12 2324  BP: 177/80  Pulse: 70  Temp:   Resp: 20   General:  Well appearing. No respiratory difficulty HEENT: normal Neck: supple. no JVD. Carotids 2+ bilat; no bruits. No lymphadenopathy or thryomegaly appreciated. Cor: PMI nondisplaced. Regular rate & rhythm. No rubs, gallops or murmurs. Lungs: clear Abdomen: soft, nontender, nondistended. No hepatosplenomegaly. No bruits or masses. Good bowel sounds. Extremities: no cyanosis, clubbing, rash, edema Neuro: alert & oriented x 3, cranial nerves grossly intact. moves all 4 extremities w/o difficulty. Affect pleasant.  ECG: SR 75 with RBBB No ST-T wave abnormalities.    Results for orders placed during the hospital encounter of 12/16/12 (from the past 24 hour(s))  BASIC METABOLIC PANEL     Status: Abnormal  Collection Time    12/16/12  9:22 PM      Result Value Range   Sodium 141  135 - 145 mEq/L   Potassium 3.7  3.5 - 5.1 mEq/L   Chloride 105  96 - 112 mEq/L   CO2 27  19 - 32 mEq/L   Glucose, Bld 149 (*) 70 - 99 mg/dL   BUN 22  6 - 23 mg/dL   Creatinine, Ser 9.81  0.50 - 1.35 mg/dL   Calcium 9.7  8.4 - 19.1 mg/dL   GFR calc non Af Amer 71 (*) >90 mL/min   GFR calc Af Amer 82 (*) >90 mL/min  CBC WITH DIFFERENTIAL     Status: None   Collection Time    12/16/12  9:22 PM      Result Value Range   WBC 6.4  4.0 - 10.5 K/uL   RBC 5.16  4.22 - 5.81 MIL/uL     Hemoglobin 15.2  13.0 - 17.0 g/dL   HCT 47.8  29.5 - 62.1 %   MCV 88.8  78.0 - 100.0 fL   MCH 29.5  26.0 - 34.0 pg   MCHC 33.2  30.0 - 36.0 g/dL   RDW 30.8  65.7 - 84.6 %   Platelets 217  150 - 400 K/uL   Neutrophils Relative % 55  43 - 77 %   Neutro Abs 3.6  1.7 - 7.7 K/uL   Lymphocytes Relative 32  12 - 46 %   Lymphs Abs 2.1  0.7 - 4.0 K/uL   Monocytes Relative 9  3 - 12 %   Monocytes Absolute 0.6  0.1 - 1.0 K/uL   Eosinophils Relative 3  0 - 5 %   Eosinophils Absolute 0.2  0.0 - 0.7 K/uL   Basophils Relative 1  0 - 1 %   Basophils Absolute 0.0  0.0 - 0.1 K/uL  TROPONIN I     Status: None   Collection Time    12/16/12  9:22 PM      Result Value Range   Troponin I <0.30  <0.30 ng/mL  APTT     Status: None   Collection Time    12/16/12 11:08 PM      Result Value Range   aPTT 31  24 - 37 seconds  PROTIME-INR     Status: None   Collection Time    12/16/12 11:08 PM      Result Value Range   Prothrombin Time 12.5  11.6 - 15.2 seconds   INR 0.95  0.00 - 1.49   Dg Chest Portable 1 View  12/16/2012   *RADIOLOGY REPORT*  Clinical Data: Chest pain  PORTABLE CHEST - 1 VIEW  Comparison: Prior radiograph from 10/16/2012  Findings: Cardiac and mediastinal silhouettes are unchanged.  The lungs are hypoinflated.  Bibasilar linear subsegmental atelectasis is present, similar to prior.  No airspace consolidation, pleural effusion, or overt pulmonary edema.  No pneumothorax.  Osseous structures are unchanged.  IMPRESSION: Hypoinflation with bibasilar atelectasis.  No acute cardiopulmonary process.   Original Report Authenticated By: Rise Mu, M.D.    ASSESSMENT: 1. CP concerning for [place] 2. Bradycardia on monitor - per ER physician 3. H/o CHB 4. CAD s/p PCI with DES to RCA 7/14 5. HTN, uncontrolled  PLAN/DISCUSSION:  CP is concerning for [place]. Given recent PCI and recurrent episodes of bradycardia which was his previous anginal equivalent, likely warrants re-look cath. Though  I think main issue may be his BP.  Admit to SDU. Watch on monitor. No evidence of recurrent HB at this point. Cycle markers. Treat with ASA, heparin, Plavix and statin. No b-blocker due to possible recurrent bradycardia. Plan cath and BP control. Will give hydralazine now and start Norvasc.   Jailyne Chieffo,MD 12:03 AM

## 2012-12-17 ENCOUNTER — Encounter (HOSPITAL_COMMUNITY): Payer: Self-pay | Admitting: *Deleted

## 2012-12-17 ENCOUNTER — Encounter (HOSPITAL_COMMUNITY)
Admission: EM | Disposition: A | Discharge status: Home / Self Care | Payer: Self-pay | Source: Home / Self Care | Attending: Internal Medicine

## 2012-12-17 DIAGNOSIS — I2510 Atherosclerotic heart disease of native coronary artery without angina pectoris: Secondary | ICD-10-CM

## 2012-12-17 DIAGNOSIS — I441 Atrioventricular block, second degree: Secondary | ICD-10-CM

## 2012-12-17 DIAGNOSIS — I200 Unstable angina: Secondary | ICD-10-CM

## 2012-12-17 DIAGNOSIS — I498 Other specified cardiac arrhythmias: Secondary | ICD-10-CM

## 2012-12-17 DIAGNOSIS — R001 Bradycardia, unspecified: Secondary | ICD-10-CM

## 2012-12-17 HISTORY — PX: LEFT HEART CATHETERIZATION WITH CORONARY ANGIOGRAM: SHX5451

## 2012-12-17 HISTORY — PX: PERMANENT PACEMAKER INSERTION: SHX5480

## 2012-12-17 HISTORY — PX: INSERT / REPLACE / REMOVE PACEMAKER: SUR710

## 2012-12-17 LAB — POCT ACTIVATED CLOTTING TIME: Activated Clotting Time: 119 seconds

## 2012-12-17 LAB — CBC
HCT: 46.4 % (ref 39.0–52.0)
Hemoglobin: 16 g/dL (ref 13.0–17.0)
MCH: 29.5 pg (ref 26.0–34.0)
MCHC: 34.5 g/dL (ref 30.0–36.0)
MCV: 85.5 fL (ref 78.0–100.0)
Platelets: 208 10*3/uL (ref 150–400)
RBC: 5.43 MIL/uL (ref 4.22–5.81)
RDW: 13.4 % (ref 11.5–15.5)
WBC: 9.3 10*3/uL (ref 4.0–10.5)

## 2012-12-17 LAB — TSH: TSH: 3.812 u[IU]/mL (ref 0.350–4.500)

## 2012-12-17 LAB — CREATININE, SERUM
Creatinine, Ser: 0.81 mg/dL (ref 0.50–1.35)
GFR calc Af Amer: >90 mL/min (ref >90)
GFR calc non Af Amer: >90 mL/min (ref >90)

## 2012-12-17 LAB — MRSA PCR SCREENING: MRSA by PCR: NEGATIVE

## 2012-12-17 LAB — TROPONIN I
Troponin I: <0.3 ng/mL (ref <0.30)
Troponin I: <0.3 ng/mL (ref <0.30)

## 2012-12-17 LAB — LIPID PANEL
Cholesterol: 135 mg/dL (ref 0–200)
HDL: 53 mg/dL (ref >39)
LDL Cholesterol: 73 mg/dL (ref 0–99)
Total CHOL/HDL Ratio: 2.5 RATIO
Triglycerides: 44 mg/dL (ref <150)
VLDL: 9 mg/dL (ref 0–40)

## 2012-12-17 LAB — HEPARIN LEVEL (UNFRACTIONATED): Heparin Unfractionated: 0.28 IU/mL — ABNORMAL LOW (ref 0.30–0.70)

## 2012-12-17 LAB — T4, FREE: Free T4: 1.15 ng/dL (ref 0.80–1.80)

## 2012-12-17 SURGERY — PERMANENT PACEMAKER INSERTION
Anesthesia: LOCAL

## 2012-12-17 SURGERY — LEFT HEART CATHETERIZATION WITH CORONARY ANGIOGRAM
Anesthesia: LOCAL

## 2012-12-17 MED ORDER — ACETAMINOPHEN 325 MG PO TABS: 650.0000 mg | ORAL_TABLET | ORAL | Status: DC | PRN

## 2012-12-17 MED ORDER — ASPIRIN 81 MG PO CHEW
324.0000 mg | CHEWABLE_TABLET | ORAL | Status: AC
  Administered 2012-12-17: 324 mg via ORAL
  Filled 2012-12-17: qty 4

## 2012-12-17 MED ORDER — HYDRALAZINE HCL 25 MG PO TABS
25.0000 mg | ORAL_TABLET | Freq: Three times a day (TID) | ORAL | Status: DC
  Administered 2012-12-17 – 2012-12-18 (×4): 25 mg via ORAL
  Filled 2012-12-17 (×9): qty 1

## 2012-12-17 MED ORDER — NITROGLYCERIN 0.2 MG/ML ON CALL CATH LAB
INTRAVENOUS | Status: AC
  Filled 2012-12-17: qty 1

## 2012-12-17 MED ORDER — CEFAZOLIN SODIUM 1-5 GM-% IV SOLN
1.0000 g | Freq: Four times a day (QID) | INTRAVENOUS | Status: AC
  Administered 2012-12-17 – 2012-12-18 (×4): 1 g via INTRAVENOUS
  Filled 2012-12-17 (×3): qty 50

## 2012-12-17 MED ORDER — LIDOCAINE HCL (PF) 1 % IJ SOLN
INTRAMUSCULAR | Status: AC
  Filled 2012-12-17: qty 30

## 2012-12-17 MED ORDER — MIDAZOLAM HCL 5 MG/5ML IJ SOLN
INTRAMUSCULAR | Status: AC
  Filled 2012-12-17: qty 5

## 2012-12-17 MED ORDER — ASPIRIN 81 MG PO CHEW: 324.0000 mg | CHEWABLE_TABLET | ORAL | Status: DC

## 2012-12-17 MED ORDER — ASPIRIN EC 81 MG PO TBEC
81.0000 mg | DELAYED_RELEASE_TABLET | Freq: Every day | ORAL | Status: DC
  Filled 2012-12-17: qty 1

## 2012-12-17 MED ORDER — HYDRALAZINE HCL 20 MG/ML IJ SOLN
INTRAMUSCULAR | Status: AC
  Filled 2012-12-17: qty 1

## 2012-12-17 MED ORDER — LIDOCAINE HCL (PF) 1 % IJ SOLN
INTRAMUSCULAR | Status: AC
  Filled 2012-12-17: qty 60

## 2012-12-17 MED ORDER — GENTAMICIN SULFATE 40 MG/ML IJ SOLN
INTRAMUSCULAR | Status: DC
  Filled 2012-12-17: qty 2

## 2012-12-17 MED ORDER — SODIUM CHLORIDE 0.9 % IV SOLN: INTRAVENOUS | Status: DC

## 2012-12-17 MED ORDER — MIDAZOLAM HCL 2 MG/2ML IJ SOLN
INTRAMUSCULAR | Status: AC
  Filled 2012-12-17: qty 2

## 2012-12-17 MED ORDER — HEPARIN (PORCINE) IN NACL 2-0.9 UNIT/ML-% IJ SOLN
INTRAMUSCULAR | Status: AC
  Filled 2012-12-17: qty 1000

## 2012-12-17 MED ORDER — SODIUM CHLORIDE 0.9 % IV SOLN
INTRAVENOUS | Status: AC
  Administered 2012-12-17: 01:00:00 via INTRAVENOUS

## 2012-12-17 MED ORDER — SODIUM CHLORIDE 0.9 % IJ SOLN: 3.0000 mL | INTRAMUSCULAR | Status: DC | PRN

## 2012-12-17 MED ORDER — ONDANSETRON HCL 4 MG/2ML IJ SOLN: 4.0000 mg | Freq: Four times a day (QID) | INTRAMUSCULAR | Status: DC | PRN

## 2012-12-17 MED ORDER — NITROGLYCERIN 0.4 MG SL SUBL: 0.4000 mg | SUBLINGUAL_TABLET | SUBLINGUAL | Status: DC | PRN

## 2012-12-17 MED ORDER — ONDANSETRON HCL 4 MG/2ML IJ SOLN
4.0000 mg | Freq: Four times a day (QID) | INTRAMUSCULAR | Status: DC | PRN
  Administered 2012-12-18: 4 mg via INTRAVENOUS
  Filled 2012-12-17: qty 2

## 2012-12-17 MED ORDER — FENTANYL CITRATE 0.05 MG/ML IJ SOLN
INTRAMUSCULAR | Status: AC
  Filled 2012-12-17: qty 2

## 2012-12-17 MED ORDER — HYDROCODONE-ACETAMINOPHEN 5-325 MG PO TABS
1.0000 | ORAL_TABLET | ORAL | Status: DC | PRN
  Administered 2012-12-17 – 2012-12-18 (×3): 2 via ORAL
  Filled 2012-12-17 (×3): qty 2

## 2012-12-17 MED ORDER — SODIUM CHLORIDE 0.9 % IV SOLN
INTRAVENOUS | Status: DC
  Administered 2012-12-17: 10:00:00 via INTRAVENOUS

## 2012-12-17 MED ORDER — LISINOPRIL 10 MG PO TABS
10.0000 mg | ORAL_TABLET | Freq: Every day | ORAL | Status: DC
  Administered 2012-12-17 – 2012-12-18 (×2): 10 mg via ORAL
  Filled 2012-12-17 (×2): qty 1

## 2012-12-17 MED ORDER — HYDRALAZINE HCL 20 MG/ML IJ SOLN: 20.0000 mg | Freq: Once | INTRAMUSCULAR | Status: DC

## 2012-12-17 MED ORDER — SIMVASTATIN 10 MG PO TABS
10.0000 mg | ORAL_TABLET | Freq: Every day | ORAL | Status: DC
  Filled 2012-12-17 (×2): qty 1

## 2012-12-17 MED ORDER — LISINOPRIL 5 MG PO TABS
5.0000 mg | ORAL_TABLET | Freq: Every day | ORAL | Status: DC
  Filled 2012-12-17: qty 1

## 2012-12-17 MED ORDER — AMLODIPINE BESYLATE 10 MG PO TABS
10.0000 mg | ORAL_TABLET | Freq: Every day | ORAL | Status: DC
  Administered 2012-12-17 – 2012-12-18 (×2): 10 mg via ORAL
  Filled 2012-12-17 (×2): qty 1

## 2012-12-17 MED ORDER — ASPIRIN EC 81 MG PO TBEC
81.0000 mg | DELAYED_RELEASE_TABLET | Freq: Every day | ORAL | Status: DC
  Administered 2012-12-18: 81 mg via ORAL
  Filled 2012-12-17: qty 1

## 2012-12-17 MED ORDER — SODIUM CHLORIDE 0.9 % IJ SOLN: 3.0000 mL | Freq: Two times a day (BID) | INTRAMUSCULAR | Status: DC

## 2012-12-17 MED ORDER — CEFAZOLIN SODIUM-DEXTROSE 2-3 GM-% IV SOLR: 2.0000 g | INTRAVENOUS | Status: DC

## 2012-12-17 MED ORDER — CEFAZOLIN SODIUM-DEXTROSE 2-3 GM-% IV SOLR
2.0000 g | INTRAVENOUS | Status: DC
  Filled 2012-12-17: qty 50

## 2012-12-17 MED ORDER — HYDRALAZINE HCL 20 MG/ML IJ SOLN
10.0000 mg | INTRAMUSCULAR | Status: DC | PRN
  Administered 2012-12-17: 10 mg via INTRAVENOUS

## 2012-12-17 MED ORDER — PANTOPRAZOLE SODIUM 40 MG PO TBEC: 40.0000 mg | DELAYED_RELEASE_TABLET | Freq: Every day | ORAL | Status: DC

## 2012-12-17 MED ORDER — CHLORHEXIDINE GLUCONATE 4 % EX LIQD
60.0000 mL | Freq: Once | CUTANEOUS | Status: DC
  Filled 2012-12-17: qty 60

## 2012-12-17 MED ORDER — ASPIRIN 300 MG RE SUPP
300.0000 mg | RECTAL | Status: DC
  Filled 2012-12-17: qty 1

## 2012-12-17 MED ORDER — SODIUM CHLORIDE 0.9 % IV SOLN: 250.0000 mL | INTRAVENOUS | Status: DC | PRN

## 2012-12-17 MED ORDER — TAMSULOSIN HCL 0.4 MG PO CAPS
0.4000 mg | ORAL_CAPSULE | Freq: Every day | ORAL | Status: DC
  Filled 2012-12-17 (×2): qty 1

## 2012-12-17 MED ORDER — PANTOPRAZOLE SODIUM 40 MG PO TBEC
40.0000 mg | DELAYED_RELEASE_TABLET | Freq: Every day | ORAL | Status: DC
  Administered 2012-12-17 – 2012-12-18 (×2): 40 mg via ORAL
  Filled 2012-12-17: qty 1

## 2012-12-17 MED ORDER — CLOPIDOGREL BISULFATE 75 MG PO TABS
75.0000 mg | ORAL_TABLET | Freq: Every day | ORAL | Status: DC
  Administered 2012-12-17 – 2012-12-18 (×2): 75 mg via ORAL
  Filled 2012-12-17 (×3): qty 1

## 2012-12-17 MED ORDER — SODIUM CHLORIDE 0.9 % IJ SOLN
3.0000 mL | Freq: Two times a day (BID) | INTRAMUSCULAR | Status: DC
  Administered 2012-12-17: 3 mL via INTRAVENOUS

## 2012-12-17 MED ORDER — ACETAMINOPHEN 325 MG PO TABS: 325.0000 mg | ORAL_TABLET | ORAL | Status: DC | PRN

## 2012-12-17 MED ORDER — HEPARIN SODIUM (PORCINE) 5000 UNIT/ML IJ SOLN
5000.0000 [IU] | Freq: Three times a day (TID) | INTRAMUSCULAR | Status: DC
  Filled 2012-12-17 (×3): qty 1

## 2012-12-17 MED ORDER — SODIUM CHLORIDE 0.9 % IR SOLN: 80.0000 mg | Status: DC

## 2012-12-17 NOTE — Op Note (Signed)
SURGEON:  Hillis Range, MD     PREPROCEDURE DIAGNOSIS:  Symptomatic mobitz II second degree AV block    POSTPROCEDURE DIAGNOSIS:  Symptomatic mobitz II second degree AV block     PROCEDURES:   1. Pacemaker implantation.     INTRODUCTION:  [name] is a 57 y.o. male with a history of symptomatic mobitz II second degree AV block who presents today for pacemaker implantation.  The patient reports intermittent episodes of fatigue and decreased exercise tolerance over the past few months. He has been observed to have mobitz II second degree AV block with 2:1 AV conduction. No reversible causes have been identified.  The patient therefore presents today for pacemaker implantation.     DESCRIPTION OF PROCEDURE:  Informed written consent was obtained, and  the patient was brought to the electrophysiology lab in a fasting state.  The patient received IV Versed and fentanyl as sedation.  The patients left chest was prepped and draped in the usual sterile fashion by the EP lab staff. The skin overlying the left deltopectoral region was infiltrated with lidocaine for local analgesia.  A 4-cm incision was made over the left deltopectoral region.  A left subcutaneous pacemaker pocket was fashioned using a combination of sharp and blunt dissection. Electrocautery was required to assure hemostasis.     RA/RV Lead Placement: The left axillary vein was cannulated. No contrast was required for the procedure today.  Through the left axillary vein, a Medtronic model [id number] (serial number [id number]) right atrial lead and a Medtronic model 5092- 58 (serial number [id number]) right ventricular lead were advanced with fluoroscopic visualization into the right atrial appendage and right ventricular apex positions respectively.  Initial atrial lead P- waves measured 3.2 mV with impedance of 903 ohms and a threshold of 1.4 V at 0.5 msec.  Right ventricular lead R-waves measured 13 mV with an impedance of 674 ohms and  a threshold of 0.5 V at 0.5 msec.  Both leads were secured to the pectoralis fascia using #2-0 silk over the suture sleeves.   Device Placement:  The leads were then connected to a Medtronic Adapta L model ADDRL 1 (serial number [id number]) pacemaker.  The pocket was irrigated with copious gentamicin solution.  The pacemaker was then placed into the pocket.  The pocket was then closed in 2 layers with 2.0 Vicryl suture for the subcutaneous and subcuticular layers.  Steri- Strips and a sterile dressing were then applied.  There were no early apparent complications.     CONCLUSIONS:   1. Successful implantation of a Medtronic Adapta L dual-chamber pacemaker for symptomatic mobitz II second degree AV block  2. No early apparent complications.           Hillis Range, MD 12/17/2012 4:12 PM

## 2012-12-17 NOTE — Consult Note (Signed)
ELECTROPHYSIOLOGY CONSULT NOTE    Patient ID: [name] MRN: [id number], DOB/AGE: 12/26/1955 57 y.o.  Admit date: 12/16/2012 Date of Consult: 12-17-2012  Primary Physician: Lilyan Punt, MD Primary Cardiologist: Purvis Sheffield  Reason for Consultation: heart block  HPI:  [name] is a 57 year old male with a history of coronary artery disease (s/p RCA stent in July of this year), hypertension, GERD, degenerative disc disease, and sleep apnea.    He was admitted in July of this year with chest pain and AV block.  He was found to have significant RCA disease and underwent stenting at that time.  After stenting, his heart block resolved.  He has done well since that time until last week when he began having symptoms of fatigue and lightheadedness.  He checked his heart rate at home and found it to be 30.  Because of this, he came to [facility] for further evaluation.  Telemetry has demonstrated intermittent 2:1 heart block.  Catheterization today demonstrated patency of his stent.  Echocardiogram in July of this year demonstrated an EF of 55% with inferobasal hypokinesis.  He has not been on AV nodal blocking agents.  There have been no reversible causes found for his heart block.  Prior to last week, he denies chest pain, shortness of breath, syncope, pre-syncope, or palpitations.    EP has been asked to evaluate for treatment options.  ROS is negative except as outlined above.   Past Medical History  Diagnosis Date  . Hypertension   . GERD (gastroesophageal reflux disease)   . Colon polyps 2011    tubular adenoma by excisional biopsy during colonoscopy  . ADD (attention deficit disorder)     Rx-Adderall  . Degenerative disc disease, cervical   . Palpitations 2003    Holter in 2003: PACs and PVCs; negative stress nuclear test in 2005; right bundle branch block; echo in 2008-mild LVH, otherwise normal; 2008-negative stress nuclear test.  . Prostatitis     prostate calcifications by  CT  . Sleep apnea     questionable diagnosis  . Pneumonia   . Anginal pain   . Complete heart block   . Coronary artery disease      Surgical History:  Past Surgical History  Procedure Laterality Date  . Anterior cervical decomp/discectomy fusion    . Knee surgery      rt  . Colonoscopy w/ polypectomy  2011    tubular adenoma  . Esophagogastroduodenoscopy      Gastritis     Prescriptions prior to admission  Medication Sig Dispense Refill  . aspirin EC 81 MG tablet Take 81 mg by mouth daily.      . clopidogrel (PLAVIX) 75 MG tablet Take 1 tablet (75 mg total) by mouth daily with breakfast.  30 tablet  11  . lisinopril (PRINIVIL,ZESTRIL) 5 MG tablet Take 1 tablet (5 mg total) by mouth daily.  90 tablet  1  . pantoprazole (PROTONIX) 40 MG tablet take 1 tablet by mouth once daily  30 tablet  5  . simvastatin (ZOCOR) 10 MG tablet Take 1 tablet (10 mg total) by mouth daily at 6 PM.  30 tablet  11  . tamsulosin (FLOMAX) 0.4 MG CAPS Take 1 capsule (0.4 mg total) by mouth as needed.  30 capsule  5  . ibuprofen (ADVIL,MOTRIN) 200 MG tablet Take 400 mg by mouth every 6 (six) hours as needed for pain.        Inpatient Medications:  . [facility]  HOLD] amLODipine  10 mg Oral Daily  . [facility] HOLD] aspirin  324 mg Oral NOW   Or  . [MAR HOLD] aspirin  300 mg Rectal NOW  . [facility] HOLD] aspirin EC  81 mg Oral Daily  . [facility] HOLD] clopidogrel  75 mg Oral Q breakfast  . heparin  5,000 Units Subcutaneous Q8H  . hydrALAZINE      . [facility] HOLD] hydrALAZINE  20 mg Intravenous Once  . [MAR HOLD] hydrALAZINE  25 mg Oral Q8H  . [MAR HOLD] lisinopril  10 mg Oral Daily  . [MAR HOLD] pantoprazole  40 mg Oral Daily  . [facility] HOLD] simvastatin  10 mg Oral q1800  . sodium chloride  3 mL Intravenous Q12H  . [facility] HOLD] tamsulosin  0.4 mg Oral QPC supper    Allergies:  Allergies  Allergen Reactions  . Latex Rash    History   Social History  . Marital Status: Married    Spouse Name: N/A    Number of  Children: 3  . Years of Education: N/A   Occupational History  . Not on file.   Social History Main Topics  . Smoking status: Former Smoker    Types: Cigarettes    Quit date: 12/20/2003  . Smokeless tobacco: Never Used  . Alcohol Use: 0.6 oz/week    1 Shots of liquor per week     Comment: occasional  . Drug Use: No     Comment: 30 + years ago - "crank, marjiuanna, ect."  . Sexual Activity: Yes    Birth Control/ Protection: Surgical   Other Topics Concern  . Not on file   Social History Narrative  . No narrative on file     Family History  Problem Relation Age of Onset  . Heart disease Mother   . Hypertension Mother     Carotid disease    Physical Exam: Filed Vitals:   12/17/12 1100 12/17/12 1120 12/17/12 1200 12/17/12 1242  BP: 159/79  178/89   Pulse:    81  Temp:  97.9 F (36.6 C)    TempSrc:  Oral    Resp:      Height:      Weight:      SpO2:        GEN- The patient is well appearing, alert and oriented x 3 today.   Head- normocephalic, atraumatic Eyes-  Sclera clear, conjunctiva pink Ears- hearing intact Oropharynx- clear Neck- supple,   Lungs- Clear to ausculation bilaterally, normal work of breathing Heart- Regular rate and rhythm, no murmurs, rubs or gallops, PMI not laterally displaced GI- soft, NT, ND, + BS Extremities- no clubbing, cyanosis, or edema MS- no significant deformity or atrophy Skin- no rash or lesion Psych- euthymic mood, full affect Neuro- strength and sensation are intact  Labs:   Lab Results  Component Value Date   WBC 6.4 12/16/2012   HGB 15.2 12/16/2012   HCT 45.8 12/16/2012   MCV 88.8 12/16/2012   PLT 217 12/16/2012    Recent Labs Lab 12/16/12 2122  NA 141  K 3.7  CL 105  CO2 27  BUN 22  CREATININE 1.12  CALCIUM 9.7  GLUCOSE 149*   Lab Results  Component Value Date   TROPONINI <0.30 12/17/2012   Lab Results  Component Value Date   CHOL 135 12/17/2012   CHOL 149 10/17/2012   Lab Results  Component Value Date    HDL 53 12/17/2012   HDL 44 10/17/2012  Lab Results  Component Value Date   LDLCALC 73 12/17/2012   LDLCALC 84 10/17/2012   Lab Results  Component Value Date   TRIG 44 12/17/2012   TRIG 104 10/17/2012   Lab Results  Component Value Date   CHOLHDL 2.5 12/17/2012   CHOLHDL 3.4 10/17/2012     Radiology/Studies: Dg Chest Portable 1 View 12/16/2012   *RADIOLOGY REPORT*  Clinical Data: Chest pain  PORTABLE CHEST - 1 VIEW  Comparison: Prior radiograph from 10/16/2012  Findings: Cardiac and mediastinal silhouettes are unchanged.  The lungs are hypoinflated.  Bibasilar linear subsegmental atelectasis is present, similar to prior.  No airspace consolidation, pleural effusion, or overt pulmonary edema.  No pneumothorax.  Osseous structures are unchanged.  IMPRESSION: Hypoinflation with bibasilar atelectasis.  No acute cardiopulmonary process.   Original Report Authenticated By: Rise Mu, M.D.    JXB:JYNWG rhythm  TELEMETRY: sinus rhythm with intermittent Mobitz II heart block  Assessment and Plan:  1. Symptomatic mobitz II second degree AV block The patient has symptomatic mobitz II second degree AV block.  No reversible causes have been found.  I would therefore recommend pacemaker implantation at this time.  Risks, benefits, alternatives to pacemaker implantation were discussed in detail with the patient and his spouse today. The patient understands that the risks include but are not limited to bleeding, infection, pneumothorax, perforation, tamponade, vascular damage, renal failure, MI, stroke, death,  and lead dislodgement and wishes to proceed. We will therefore schedule the procedure at the next available time. [place].  I think that ordering lyme titers would be prudent.  2. CAD .cath reviewed No intervention planned

## 2012-12-17 NOTE — Progress Notes (Addendum)
Patient Name: [name] Date of Encounter: 12/17/2012   Principal Problem:   Intermediate coronary syndrome Active Problems:   Coronary atherosclerosis of native coronary artery   S/P drug eluting coronary stent placement   Bradycardia   Mobitz type II atrioventricular block   GERD (gastroesophageal reflux disease)   Hypertension   SUBJECTIVE  No chest pain overnight.  Transient 2:1 HB noted on tele.  CURRENT MEDS . sodium chloride   Intravenous STAT  . amLODipine  10 mg Oral Daily  . aspirin  324 mg Oral NOW   Or  . aspirin  300 mg Rectal NOW  . [START ON 12/18/2012] aspirin  324 mg Oral Pre-Cath  . aspirin EC  81 mg Oral Daily  . [START ON 12/18/2012] aspirin EC  81 mg Oral Daily  . clopidogrel  75 mg Oral Q breakfast  . hydrALAZINE      . hydrALAZINE  20 mg Intravenous Once  . lisinopril  5 mg Oral Daily  . pantoprazole  40 mg Oral Daily  . simvastatin  10 mg Oral q1800  . sodium chloride  3 mL Intravenous Q12H  . tamsulosin  0.4 mg Oral QPC supper    OBJECTIVE  Filed Vitals:   12/17/12 0200 12/17/12 0300 12/17/12 0400 12/17/12 0500  BP: 210/96 170/90 176/110 155/69  Pulse:      Temp:   97.5 F (36.4 C)   TempSrc:   Oral   Resp: 20 17 18    Height:      Weight:      SpO2: 96% 98% 99% 99%    Intake/Output Summary (Last 24 hours) at 12/17/12 0752 Last data filed at 12/17/12 0700  Gross per 24 hour  Intake  497.5 ml  Output      0 ml  Net  497.5 ml   Filed Weights   12/16/12 2109 12/17/12 0100  Weight: 225 lb (102.059 kg) 227 lb 8.2 oz (103.2 kg)    PHYSICAL EXAM  General: Pleasant, NAD. Neuro: Alert and oriented X 3. Moves all extremities spontaneously. Psych: Normal affect. HEENT:  Normal  Neck: Supple without bruits or JVD. Lungs:  Resp regular and unlabored, CTA. Heart: RRR no s3, s4, or murmurs. Abdomen: Soft, non-tender, non-distended, BS + x 4.  Extremities: No clubbing, cyanosis or edema. DP/PT/Radials 2+ and equal  bilaterally.  Accessory Clinical Findings  CBC  Recent Labs  12/16/12 2122  WBC 6.4  NEUTROABS 3.6  HGB 15.2  HCT 45.8  MCV 88.8  PLT 217   Basic Metabolic Panel  Recent Labs  12/16/12 2122  NA 141  K 3.7  CL 105  CO2 27  GLUCOSE 149*  BUN 22  CREATININE 1.12  CALCIUM 9.7   Cardiac Enzymes  Recent Labs  12/16/12 2122 12/17/12 0215  TROPONINI <0.30 <0.30   Fasting Lipid Panel  Recent Labs  12/17/12 0215  CHOL 135  HDL 53  LDLCALC 73  TRIG 44  CHOLHDL 2.5   TELE  rsr-->sinus brady w/ int 2:1 HB and rates in 30's (1:15 AM)  ECG  Rsr, 75, rbbb.  Radiology/Studies  Dg Chest Portable 1 View  12/16/2012   *RADIOLOGY REPORT*  Clinical Data: Chest pain  PORTABLE CHEST - 1 VIEW  Comparison: Prior radiograph from 10/16/2012  Findings: Cardiac and mediastinal silhouettes are unchanged.  The lungs are hypoinflated.  Bibasilar linear subsegmental atelectasis is present, similar to prior.  No airspace consolidation, pleural effusion, or overt pulmonary edema.  No pneumothorax.  Osseous structures are unchanged.  IMPRESSION: Hypoinflation with bibasilar atelectasis.  No acute cardiopulmonary process.   Original Report Authenticated By: Rise Mu, M.D.    ASSESSMENT AND PLAN  1.  USA/CAD:  Pt presented yesterday with exertional chest pain and finding of bradycardia on home BP monitor.  Was also noted to have intermittent bradycardia in ER and was documented to have intermittent 2:1 HB @ 1:15 this AM.  It's not clear if this was symptomatic.  Given that heart block was present prior to PCI and now pt presents with recurrent angina and transient heart block, we will pursue diagnostic cath today, schedule permitting, to reevaluate RCA stent.  If stent is patent, we will consult EP for consideration of pacing.  Pt is not on a bb.  Cont current meds.  2.  Transient 2:1 HB:  Probable EP consult pending cath results.  He has had presyncope with documented  bradycardia @ home.  3.  HTN:  Titrate lisinopril.  Cont norvasc.  Avoid avn blocking agents.   4.  HL:  ldl 73.  Cont statin.   Signed, Nicolasa Ducking NP  Patient seen with NP, agree with note.  He had chest tightness yesterday but also had lightheadedness with HR in the 30s on his BP cuff.  Enzymes negative so far.  Overnight, had 2:1 AVB with HR in 30s briefly.  Now in NSR in the 60s.  - Plan LHC this morning: need to make sure RCA stent is open as RCA-territory ischemia could be causing heart block (had complete HB a couple of months ago in the setting of RCA ischemia).  - If stent is open, will likely need PCM given symptomatic heart block.  - Control BP, quite high currently.  Will increase lisinopril and will use hydralazine po for now (short-acting given concern for symptomatic bradycardia).   Marca Ancona 12/17/2012 8:21 AM

## 2012-12-17 NOTE — Care Management Note (Signed)
    Page 1 of 1   12/17/2012     9:16:47 AM   CARE MANAGEMENT NOTE 12/17/2012  Patient:  [name], [name]   Account Number:  [id number]  Date Initiated:  12/17/2012  Documentation initiated by:  Junius Creamer  Subjective/Objective Assessment:   adm w ch pain, htn     Action/Plan:   lives w wife, pcp dr Lorin Picket [place]   Anticipated DC Date:     Anticipated DC Plan:        DC Planning Services  CM consult      Choice offered to / List presented to:             Status of service:   Medicare Important Message given?   (If response is "NO", the following Medicare IM given date fields will be blank) Date Medicare IM given:   Date Additional Medicare IM given:    Discharge Disposition:    Per UR Regulation:  Reviewed for med. necessity/level of care/duration of stay  If discussed at Long Length of Stay Meetings, dates discussed:    Comments:

## 2012-12-17 NOTE — CV Procedure (Addendum)
   Cardiac Catheterization Procedure Note  Name: [name] MRN: [id number] DOB: [birth year]  Procedure: Left Heart Cath, Selective Coronary Angiography, LV angiography  Indication: Unstable angina, 2:1 heart block.    Procedural details: Femoral access was used rather than radial access as on prior cath the RCA could not be engaged radially.  The right groin was prepped, draped, and anesthetized with 1% lidocaine. Using modified Seldinger technique, a 5 French sheath was introduced into the right femoral artery. MP catheter was used for coronary angiography and left ventriculography. Catheter exchanges were performed over a guidewire. There were no immediate procedural complications. The patient was transferred to the post catheterization recovery area for further monitoring.  Procedural Findings: Hemodynamics:  AO 153/83 LV 152/152   Coronary angiography: Coronary dominance: right  Left mainstem: No significant disease.   Left anterior descending (LAD): 30% proximal LAD after D1.  40% mid to distal LAD.   Left circumflex (LCx): Large vessel, no significant disease.   Right coronary artery (RCA): High anterior take-off.  Patent proximal to mid RCA stent with no significant in-stent restenosis.    Left ventriculography: Hand LV-gram, EF 60-65%.    Final Conclusions:  Patent RCA stent, no obstructive disease.  At this point, I think that he will need a pacemaker for symptomatic 2:1 AV block.   Marca Ancona 12/17/2012, 1:22 PM

## 2012-12-17 NOTE — Progress Notes (Signed)
ANTICOAGULATION CONSULT NOTE - follow up Pharmacy Consult for Heparin Indication: chest pain/ACS  Allergies  Allergen Reactions  . Latex Rash    Patient Measurements: Height: 6' (182.9 cm) Weight: 227 lb 8.2 oz (103.2 kg) IBW/kg (Calculated) : 77.6 Heparin Dosing Weight: 98 kg Vital Signs: Temp: 97.6 F (36.4 C) (09/02 0709) Temp src: Oral (09/02 0709) BP: 197/96 mmHg (09/02 0709) Pulse Rate: 70 (09/01 2324)  Labs:  Recent Labs  12/16/12 2122 12/16/12 2308 12/17/12 0215 12/17/12 0715  HGB 15.2  --   --   --   HCT 45.8  --   --   --   PLT 217  --   --   --   APTT  --  31  --   --   LABPROT  --  12.5  --   --   INR  --  0.95  --   --   HEPARINUNFRC  --   --   --  0.28*  CREATININE 1.12  --   --   --   TROPONINI <0.30  --  <0.30 <0.30    Estimated Creatinine Clearance: 89.3 ml/min (by C-G formula based on Cr of 1.12).    Assessment: First HL = 0.28 about 7.5 hours after a 4000 unit bolus and drip at 1200 units/hr.  HL slightly below goal.  No bleeding reported.  Enzymes negative x 2.  Plan per cards note "Plan LHC this morning: need to make sure RCA stent is open as RCA-territory ischemia could be causing heart block (had complete HB a couple of months ago in the setting of RCA ischemia). - If stent is open, will likely need PCM given symptomatic heart block. "     Goal of Therapy:  Heparin level 0.3-0.7 units/ml Monitor platelets by anticoagulation protocol: Yes   Plan:  1. Increase heparin drip to 1400 units/hr 2. 6 hr HL or F/u after cath Herby Abraham, Pharm.D. [phone] 12/17/2012 8:42 AM

## 2012-12-18 ENCOUNTER — Inpatient Hospital Stay (HOSPITAL_COMMUNITY): Payer: BC Managed Care – PPO

## 2012-12-18 MED ORDER — LISINOPRIL 5 MG PO TABS: 10.0000 mg | ORAL_TABLET | Freq: Every day | ORAL | Status: DC

## 2012-12-18 MED ORDER — AMLODIPINE BESYLATE 10 MG PO TABS: 10.0000 mg | ORAL_TABLET | Freq: Every day | ORAL | Status: DC

## 2012-12-18 MED ORDER — HYDRALAZINE HCL 25 MG PO TABS: 25.0000 mg | ORAL_TABLET | Freq: Three times a day (TID) | ORAL | Status: DC

## 2012-12-18 NOTE — Progress Notes (Signed)
D/C instructions reviewed with pt and his wife. Copy of instructions given to pt. Scripts electronically sent in to pt pharmacy by MD. (as pt was leaving pt states he has had some numbness in left upper thigh today after having a pain in upper thigh, pt states he thought it was related to laying in bed so much, pt able to walk around well independently, pulses 2+, Dr Allred up on unit as pt was walking out and pt informed him, (pt did not want MD called, did not want to wait and was going to go home and call office if area continues to be numb).  Pt d/c'd with belongings with wife, pt declined wheelchair, wanted to walk. Steady gait.

## 2012-12-18 NOTE — Progress Notes (Signed)
Patient: [name] Date of Encounter: 12/18/2012, 7:47 AM Admit date: 12/16/2012     Subjective  Mr. Albarran reports mild incisional soreness at implant site. He denies CP, SOB or palpitations.   Objective  Physical Exam: Vitals: BP 147/84  Pulse 68  Temp(Src) 97.9 F (36.6 C) (Oral)  Resp 18  Ht 6' (1.829 m)  Wt 227 lb 8.2 oz (103.2 kg)  BMI 30.85 kg/m2  SpO2 95% General: Well developed, well appearing 57 year old male in no acute distress. Neck: Supple. JVD not elevated. Lungs: Clear bilaterally to auscultation without wheezes, rales, or rhonchi. Breathing is unlabored. Heart: RRR S1 S2 without murmurs, rubs, or gallops.  Abdomen: Soft, non-distended. Extremities: No clubbing or cyanosis. No edema.  Distal pedal pulses are 2+ and equal bilaterally. Neuro: Alert and oriented X 3. Moves all extremities spontaneously. No focal deficits. Skin: Left upper chest/implant site intact without hematoma.  Intake/Output:  Intake/Output Summary (Last 24 hours) at 12/18/12 0747 Last data filed at 12/17/12 2343  Gross per 24 hour  Intake    781 ml  Output   1050 ml  Net   -269 ml    Inpatient Medications:  . amLODipine  10 mg Oral Daily  . aspirin EC  81 mg Oral Daily  . clopidogrel  75 mg Oral Q breakfast  . hydrALAZINE  20 mg Intravenous Once  . hydrALAZINE  25 mg Oral Q8H  . lisinopril  10 mg Oral Daily  . pantoprazole  40 mg Oral Daily  . simvastatin  10 mg Oral q1800  . sodium chloride  3 mL Intravenous Q12H  . tamsulosin  0.4 mg Oral QPC supper    Labs:  Recent Labs  12/16/12 2122 12/17/12 1830  NA 141  --   K 3.7  --   CL 105  --   CO2 27  --   GLUCOSE 149*  --   BUN 22  --   CREATININE 1.12 0.81  CALCIUM 9.7  --     Recent Labs  12/16/12 2122 12/17/12 1830  WBC 6.4 9.3  NEUTROABS 3.6  --   HGB 15.2 16.0  HCT 45.8 46.4  MCV 88.8 85.5  PLT 217 208    Recent Labs  12/16/12 2122 12/17/12 0215 12/17/12 0715  TROPONINI <0.30 <0.30 <0.30     Recent Labs  12/17/12 0215  CHOL 135  HDL 53  LDLCALC 73  TRIG 44  CHOLHDL 2.5    Recent Labs  12/17/12 0215  TSH 3.812    Recent Labs  12/16/12 2308  INR 0.95    Radiology/Studies: Chest x-ray: this AM shows stable lead placement; official report pending  Device interrogation: performed by industry this AM; shows normal PPM function Telemetry: A paced V sensed currently; brief, nonsustained atrial tachycardia overnight    Assessment and Plan  1. Symptomatic Mobitz II AV block s/p PPM implant yesterday - doing well post PPM implant - wound without hematoma - device interrogation shows normal PPM function - chest x-ray shows stable lead placement; official report pending - reviewed wound care, activity restrictions and follow-up - wound check in 7-10 days and see Dr. Johney Frame in [place] office in 3 months  Dr. Johney Frame to see Signed, EDMISTEN, BROOKE PA-C  I have seen, examined the patient, and reviewed the above assessment and plan.  Changes to above are made where necessary.   Doing well s/p PPM for mobitz II second degree AV block.  Device interrogation  is reviewed and normal.  Will discharge to home with routine wound care and follow-up  Co Sign: Hillis Range, MD 12/18/2012 11:43 AM

## 2012-12-18 NOTE — Discharge Summary (Signed)
ELECTROPHYSIOLOGY DISCHARGE SUMMARY    Patient ID: [name],  MRN: [id number], DOB/AGE: [birth year] 58 y.o.  Admit date: 12/16/2012 Discharge date: 12/18/2012  Primary Care Physician: Lilyan Punt, MD Primary Cardiologist / EP: Purvis Sheffield, MD / Johney Frame, MD  Primary Discharge Diagnosis:  1. Symptomatic Mobitz II AV block s/p PPM implantation 2. HTN  Secondary Discharge Diagnoses:  1. CAD s/p PCI to RCA July 2014 2. ADD 3. PSVT 4. OSA 5. GERD  Procedures This Admission:  1. Left heart catheterization 12/17/2012 Procedural Findings:  Hemodynamics:  AO 153/83  LV 152/152  Coronary angiography:  Coronary dominance: right  Left mainstem: No significant disease.  Left anterior descending (LAD): 30% proximal LAD after D1. 40% mid to distal LAD.  Left circumflex (LCx): Large vessel, no significant disease.  Right coronary artery (RCA): High anterior take-off. Patent proximal to mid RCA stent with no significant in-stent restenosis.  Left ventriculography: Hand LV-gram, EF 60-65%.  Final Conclusions: Patent RCA stent, no obstructive disease. At this point, I think that he will need a pacemaker for symptomatic 2:1 AV block.  2. Dual chamber PPM implantation 12/17/2012 RA lead - Medtronic model [id number] (serial number [id number]) RV lead - Medtronic model [phone] (serial number [id number]) Device - Medtronic Adapta L model ADDRL 1 (serial number [id number])  History and Hospital Course:  [name] is a pleasant 57 year old man with CAD s/p recent PCI to RCA July 2014, preserved LV function and HTN who was admitted with chest pain on 12/16/2012.   Briefly, he was hospitalized in July 2014 in the setting of complete heart block. Underwent cath which showed single vessel CAD with 90% RCA lesion. (otherwise normal) with normal EF. Underwent PCI with Promus DES. CHB resolved with stenting. An echo was completed during hospitalization revealing normal LV function. He followed up  with Dr. Purvis Sheffield a few weeks ago. He was doing very well - walking 4-5 miles daily. 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 Minimal chest tightness, and usually occurs in the setting of hot temps and humidity. He had a monitor which showed short bursts of SVT, which may have been sinus tachycardia as he was "digging a hole" at the time. No CHB. His BP was poorly controlled. Started lisinopril 5mg  daily. Since starting lisinopril hasn't felt very well. Was mowing grass today and had some chest tightness. Describes as a burning pressure. Happened every time he exerted himself. Was checking BP and HR. SBP 170. Said HR kept dipping down into 40s. Came to ED. CP free. ECG with RBBB with HR in 70s. No acute changes. Troponin normal. No syncope.  He was admitted to telemetry. He had documented intermittent Mobitz II AV block. He underwent cardiac catheterization which revealed stable CAD with patent RCA stent. Normal LV function. There were no reversible causes for AV block identified. He then underwent PPM implantation on 12/17/2012. [name] tolerated this procedure well without any immediate complication. He remains hemodynamically stable and afebrile. His chest xray shows stable lead placement without pneumothorax. His device interrogation shows normal PPM function with stable lead parameters/measurements. His implant site is intact without significant bleeding or hematoma.   He has been given discharge instructions including wound care and activity restrictions. He will follow-up in 10 days for wound check. Amlodipine and hydralazine were added for better BP control otherwise there were no changes made to his medications. He has been seen, examined and deemed stable for discharge today by Dr. Fayrene Fearing  [name].   Discharge Vitals: Blood pressure 147/84, pulse 68, temperature 97.9 F (36.6 C), temperature source Oral, resp. rate 18, height 6' (1.829 m), weight 227 lb 8.2 oz (103.2 kg), SpO2 95.00%.    Labs: Lab Results  Component Value Date   WBC 9.3 12/17/2012   HGB 16.0 12/17/2012   HCT 46.4 12/17/2012   MCV 85.5 12/17/2012   PLT 208 12/17/2012     Recent Labs Lab 12/16/12 2122 12/17/12 1830  NA 141  --   K 3.7  --   CL 105  --   CO2 27  --   BUN 22  --   CREATININE 1.12 0.81  CALCIUM 9.7  --   GLUCOSE 149*  --    Lab Results  Component Value Date   TROPONINI <0.30 12/17/2012    Lab Results  Component Value Date   CHOL 135 12/17/2012   CHOL 149 10/17/2012   Lab Results  Component Value Date   HDL 53 12/17/2012   HDL 44 10/17/2012   Lab Results  Component Value Date   LDLCALC 73 12/17/2012   LDLCALC 84 10/17/2012   Lab Results  Component Value Date   TRIG 44 12/17/2012   TRIG 104 10/17/2012   Lab Results  Component Value Date   CHOLHDL 2.5 12/17/2012   CHOLHDL 3.4 10/17/2012    Recent Labs  12/16/12 2308  INR 0.95    Disposition:  The patient is being discharged in stable condition.  Follow-up:     Follow-up Information   Follow up with [facility] On 12/26/2012. (At 2:00 PM for wound check)    Specialty:  Electrophysiology   Contact information:   [address] [place] [address] [phone]      Follow up with [facility] (near [employer]) On 03/20/2013. (To see Dr. Johney Frame at 8:30 AM for pacemaker f/u)    Specialty:  Cardiology   Contact information:   [address] [place] [address] [phone]      Follow up with Laqueta Linden, MD On 01/10/2013. (At 2:20 PM)    Specialty:  Cardiology   Contact information:   [address] [place] [address] [phone]      Discharge Medications:    Medication List         amLODipine 10 MG tablet  Commonly known as:  NORVASC  Take 1 tablet (10 mg total) by mouth daily.     aspirin EC 81 MG tablet  Take 81 mg by mouth daily.     clopidogrel 75 MG tablet  Commonly known as:  PLAVIX  Take 1 tablet (75 mg total) by mouth daily  with breakfast.     hydrALAZINE 25 MG tablet  Commonly known as:  APRESOLINE  Take 1 tablet (25 mg total) by mouth every 8 (eight) hours.     ibuprofen 200 MG tablet  Commonly known as:  ADVIL,MOTRIN  Take 400 mg by mouth every 6 (six) hours as needed for pain.     lisinopril 5 MG tablet  Commonly known as:  PRINIVIL,ZESTRIL  Take 2 tablets (10 mg total) by mouth daily.     pantoprazole 40 MG tablet  Commonly known as:  PROTONIX  take 1 tablet by mouth once daily     simvastatin 10 MG tablet  Commonly known as:  ZOCOR  Take 1 tablet (10 mg total) by mouth daily at 6 PM.  tamsulosin 0.4 MG Caps capsule  Commonly known as:  FLOMAX  Take 1 capsule (0.4 mg total) by mouth as needed.       Duration of Discharge Encounter: Greater than 30 minutes including physician time.  Limmie Patricia, PA-C 12/18/2012, 12:07 PM   Hillis Range, MD

## 2012-12-20 ENCOUNTER — Telehealth: Payer: Self-pay | Admitting: Internal Medicine

## 2012-12-20 ENCOUNTER — Encounter: Payer: Self-pay | Admitting: Cardiology

## 2012-12-20 ENCOUNTER — Ambulatory Visit (INDEPENDENT_AMBULATORY_CARE_PROVIDER_SITE_OTHER): Payer: BC Managed Care – PPO | Admitting: Cardiology

## 2012-12-20 ENCOUNTER — Ambulatory Visit
Admission: RE | Admit: 2012-12-20 | Discharge: 2012-12-20 | Disposition: A | Discharge status: Home / Self Care | Payer: BC Managed Care – PPO | Source: Ambulatory Visit | Attending: Cardiology | Admitting: Cardiology

## 2012-12-20 ENCOUNTER — Other Ambulatory Visit: Payer: Self-pay | Admitting: Internal Medicine

## 2012-12-20 VITALS — BP 143/78 | HR 80

## 2012-12-20 DIAGNOSIS — R0602 Shortness of breath: Secondary | ICD-10-CM

## 2012-12-20 DIAGNOSIS — I441 Atrioventricular block, second degree: Secondary | ICD-10-CM

## 2012-12-20 DIAGNOSIS — I2510 Atherosclerotic heart disease of native coronary artery without angina pectoris: Secondary | ICD-10-CM

## 2012-12-20 DIAGNOSIS — S20212A Contusion of left front wall of thorax, initial encounter: Secondary | ICD-10-CM

## 2012-12-20 DIAGNOSIS — R001 Bradycardia, unspecified: Secondary | ICD-10-CM

## 2012-12-20 DIAGNOSIS — I498 Other specified cardiac arrhythmias: Secondary | ICD-10-CM

## 2012-12-20 DIAGNOSIS — Z950 Presence of cardiac pacemaker: Secondary | ICD-10-CM

## 2012-12-20 LAB — PACEMAKER DEVICE OBSERVATION
AL AMPLITUDE: 5.6 mv
AL IMPEDENCE PM: 601 Ohm
AL THRESHOLD: 0.5 V
ATRIAL PACING PM: 13
BATTERY VOLTAGE: 2.79 V
RV LEAD AMPLITUDE: 15.68 mv
RV LEAD IMPEDENCE PM: 645 Ohm
RV LEAD THRESHOLD: 0.5 V
VENTRICULAR PACING PM: 25.1

## 2012-12-20 NOTE — Patient Instructions (Addendum)
Your physician recommends that you return for lab work in: LYME TITER TO RULE OUT LYME DISEASE  A chest x-ray takes a picture of the organs and structures inside the chest, including the heart, lungs, and blood vessels. This test can show several things, including, whether the heart is enlarges; whether fluid is building up in the lungs; and whether pacemaker / defibrillator leads are still in place.  Your physician recommends that you continue on your current medications as directed. Please refer to the Current Medication list given to you today.

## 2012-12-20 NOTE — Progress Notes (Signed)
ELECTROPHYSIOLOGY OFFICE NOTE  Patient ID: [name] MRN: [id number], DOB/AGE: Aug 28, 1955   Date of Visit: 12/20/2012  Primary Physician: Lilyan Punt, MD Primary Cardiologist: Purvis Sheffield, MD / Allred, MD Reason for Visit: Swelling at implant site  History of Present Illness  [name] is a 57 y.o. male with Mobitz II AV block s/p recent PPM implant 12/17/2012, CAD s/p recent PCI on Plavix who presents today as an add-on for evaluation of swelling and discomfort at [facility] implant site.   Past Medical History Past Medical History  Diagnosis Date  . Hypertension   . GERD (gastroesophageal reflux disease)   . Colon polyps 2011    tubular adenoma by excisional biopsy during colonoscopy  . ADD (attention deficit disorder)     Rx-Adderall  . Degenerative disc disease, cervical   . Palpitations 2003    Holter in 2003: PACs and PVCs; negative stress nuclear test in 2005; right bundle branch block; echo in 2008-mild LVH, otherwise normal; 2008-negative stress nuclear test.  . Prostatitis     prostate calcifications by CT  . Sleep apnea     questionable diagnosis  . Pneumonia   . Anginal pain   . Complete heart block   . Coronary artery disease     Past Surgical History Past Surgical History  Procedure Laterality Date  . Anterior cervical decomp/discectomy fusion    . Knee surgery      rt  . Colonoscopy w/ polypectomy  2011    tubular adenoma  . Esophagogastroduodenoscopy      Gastritis    Allergies/Intolerances Allergies  Allergen Reactions  . Latex Rash   Current Home Medications Current Outpatient Prescriptions  Medication Sig Dispense Refill  . amLODipine (NORVASC) 10 MG tablet Take 1 tablet (10 mg total) by mouth daily.  30 tablet  3  . aspirin EC 81 MG tablet Take 81 mg by mouth daily.      . clopidogrel (PLAVIX) 75 MG tablet Take 1 tablet (75 mg total) by mouth daily with breakfast.  30 tablet  11  . hydrALAZINE (APRESOLINE) 25 MG tablet Take 1 tablet  (25 mg total) by mouth every 8 (eight) hours.  90 tablet  3  . ibuprofen (ADVIL,MOTRIN) 200 MG tablet Take 400 mg by mouth every 6 (six) hours as needed for pain.      Marland Kitchen lisinopril (PRINIVIL,ZESTRIL) 5 MG tablet Take 2 tablets (10 mg total) by mouth daily.  60 tablet  3  . pantoprazole (PROTONIX) 40 MG tablet take 1 tablet by mouth once daily  30 tablet  5  . simvastatin (ZOCOR) 10 MG tablet Take 1 tablet (10 mg total) by mouth daily at 6 PM.  30 tablet  11  . tamsulosin (FLOMAX) 0.4 MG CAPS Take 1 capsule (0.4 mg total) by mouth as needed.  30 capsule  5   No current facility-administered medications for this visit.    Review of Systems General: No chills, fever, night sweats or weight changes Cardiovascular: No chest pain, dyspnea on exertion, edema, orthopnea, palpitations, paroxysmal nocturnal dyspnea Dermatological: No rash, lesions or masses Respiratory: No cough, dyspnea Urologic: No hematuria, dysuria Abdominal: No nausea, vomiting, diarrhea, bright red blood per rectum, melena, or hematemesis Neurologic: No visual changes, weakness, changes in mental status All other systems reviewed and are otherwise negative except as noted above.  Physical Exam Vitals: Blood pressure 143/78, pulse 80.  General: Well developed, well appearing 57 y.o. male in no acute distress. HEENT: Normocephalic, atraumatic. EOMs intact.  Sclera nonicteric. Oropharynx clear.  Neck: Supple. No JVD. Lungs: Respirations regular and unlabored, CTA bilaterally. No wheezes, rales or rhonchi. Heart: RRR. S1, S2 present. No murmurs, rub, S3 or S4. Abdomen: Soft, non-distended.  Extremities: No clubbing, cyanosis or edema.  Psych: Normal affect. Neuro: Alert and oriented X 3. Moves all extremities spontaneously. Skin: Left upper chest / implant site intact; incision appears to be well-healing without edema, erythema or drainage; small ecchymotic area inferiorly at midline / over sternum consistent with small chest  wall hematoma.    Diagnostics Device interrogation - normal PPM function with stable lead measurements; no episodes; no programming changes made; see PaceArt for full details Chest x-ray today - stable lead placement without PTX  Assessment and Plan 1. Chest wall hematoma - provided reassurance - chest x-ray today shows stable lead placement without PTX - return in one week for wound check 2. Mobitz II AV block s/p PPM implant - normal device function - no programming changes made 3. CAD s/p recent PCI - stable without anginal symptoms - continue Plavix  This plan of care was formulated with Dr. Johney Frame who was in to interview and examine the patient. Signed, Rick Duff, PA-C 12/20/2012, 4:22 PM

## 2012-12-20 NOTE — Telephone Encounter (Signed)
Spoke with patient, he is very nervous about his device and states he is having swelling and bruising and needs to have someone look at it before the weekend.  Spoke with Baxter Hire in the device clinic and she is going to look at it for him  Patient aware and on his way

## 2012-12-20 NOTE — Telephone Encounter (Signed)
New Problem  Pt has swelling and bruising in the center portion of his chest// not exactly at the pacemaker// pt request a call back to discuss.

## 2012-12-23 ENCOUNTER — Telehealth: Payer: Self-pay | Admitting: *Deleted

## 2012-12-23 ENCOUNTER — Other Ambulatory Visit: Payer: Self-pay | Admitting: *Deleted

## 2012-12-23 LAB — B. BURGDORFI ANTIBODIES: B burgdorferi Ab IgG+IgM: 0.28 {ISR}

## 2012-12-23 NOTE — Telephone Encounter (Signed)
Spoke with patient's wife.  Pt is slowly improving.  Hematoma resolved, bruising improving.  Aware that lyme titers and CXR were normal.  Pt will keep wound check appt on Thursday.  He discussed with Dr Johney Frame discontinuing Lisinopril because of side effects and he is doing better off this medication.  He is monitoring blood pressure at home.

## 2012-12-26 ENCOUNTER — Ambulatory Visit (INDEPENDENT_AMBULATORY_CARE_PROVIDER_SITE_OTHER)
Admission: RE | Admit: 2012-12-26 | Discharge: 2012-12-26 | Disposition: A | Discharge status: Home / Self Care | Payer: BC Managed Care – PPO | Source: Ambulatory Visit | Attending: Internal Medicine | Admitting: Internal Medicine

## 2012-12-26 ENCOUNTER — Ambulatory Visit: Payer: BC Managed Care – PPO | Admitting: *Deleted

## 2012-12-26 ENCOUNTER — Ambulatory Visit (INDEPENDENT_AMBULATORY_CARE_PROVIDER_SITE_OTHER): Payer: BC Managed Care – PPO | Admitting: Internal Medicine

## 2012-12-26 ENCOUNTER — Encounter: Payer: Self-pay | Admitting: Internal Medicine

## 2012-12-26 ENCOUNTER — Other Ambulatory Visit: Payer: BC Managed Care – PPO

## 2012-12-26 VITALS — BP 125/80 | HR 105 | Ht 72.0 in | Wt 220.0 lb

## 2012-12-26 DIAGNOSIS — R0602 Shortness of breath: Secondary | ICD-10-CM

## 2012-12-26 DIAGNOSIS — R079 Chest pain, unspecified: Secondary | ICD-10-CM | POA: Insufficient documentation

## 2012-12-26 LAB — PACEMAKER DEVICE OBSERVATION
AL AMPLITUDE: 2 mv
AL IMPEDENCE PM: 555 Ohm
AL THRESHOLD: 0.375 V
ATRIAL PACING PM: 18.1
RV LEAD AMPLITUDE: 15.68 mv
RV LEAD IMPEDENCE PM: 614 Ohm
RV LEAD THRESHOLD: 0.5 V
VENTRICULAR PACING PM: 75.1

## 2012-12-26 MED ORDER — AZILSARTAN-CHLORTHALIDONE 40-12.5 MG PO TABS: 12.5000 mg | ORAL_TABLET | Freq: Every day | ORAL | Status: DC

## 2012-12-26 MED ORDER — IOHEXOL 350 MG/ML SOLN
80.0000 mL | Freq: Once | INTRAVENOUS | Status: AC | PRN
  Administered 2012-12-26: 80 mL via INTRAVENOUS

## 2012-12-26 NOTE — Patient Instructions (Addendum)
Your physician has requested that you have a CT scan with contrast to rule out pulmonary embolism  Your physician recommends that you schedule a follow-up appointment next week with Dr. Sharrell Ku  Your physician recommends that you continue on your current medications as directed. Please refer to the Current Medication list given to you today.

## 2012-12-26 NOTE — Assessment & Plan Note (Addendum)
The patient has exertional chest discomfort and shortness of breath that is new following pacemaker implantation  There are some new medications.  Echocardiogram today demonstrates no effusion neck veins are flat excluding pericardial effusion. There is no rub or is a possibility.  The other thing with 2 hospitalizations for pulmonary embolism. If this is potentially life-threatening, will undertake a CAT scan today. Creatinine last week or so was less than 1.0.  CT scan was negative.  The other thing is could this be medication issue; I suspect this is unlikely but we will plan to put him back on his pre-seizure medications which he took for his blood pressure which he tolerated better.

## 2012-12-26 NOTE — Progress Notes (Signed)
Patient Care Team: Babs Sciara, MD as PCP - General (Family Medicine) Kathlen Brunswick, MD (Cardiology)   HPI  [name] is a 57 y.o. male Seen because of shortness of breath and chest discomfort following pacemaker implantation undertaken last week.  He has a history of coronary artery disease for which she underwent stenting about a month ago. This was identified when he presented with profound bradycardia. A 95% lesion was noted.  He then had recurrent profound bradycardia, the cause of which was not known. He underwent repeat catheterization; no obstruction was identified and he underwent pacing.  Since that time he has noted shortness of breath and chest pain with exertion. He denies edema. There has been no lightheadedness.  The discomfort that he is having at this point is distinct from the exertional discomfort he had prior to his stent.  Prior to his pacemaker he had been walking 5 miles a day; he is markedly limited this time.  Past Medical History  Diagnosis Date  . Hypertension   . GERD (gastroesophageal reflux disease)   . Colon polyps 2011    tubular adenoma by excisional biopsy during colonoscopy  . ADD (attention deficit disorder)     Rx-Adderall  . Degenerative disc disease, cervical   . Palpitations 2003    Holter in 2003: PACs and PVCs; negative stress nuclear test in 2005; right bundle branch block; echo in 2008-mild LVH, otherwise normal; 2008-negative stress nuclear test.  . Prostatitis     prostate calcifications by CT  . Sleep apnea     questionable diagnosis  . Pneumonia   . Anginal pain   . Complete heart block   . Coronary artery disease     Past Surgical History  Procedure Laterality Date  . Anterior cervical decomp/discectomy fusion    . Knee surgery      rt  . Colonoscopy w/ polypectomy  2011    tubular adenoma  . Esophagogastroduodenoscopy      Gastritis    Current Outpatient Prescriptions  Medication Sig Dispense Refill   . amLODipine (NORVASC) 10 MG tablet Take 1 tablet (10 mg total) by mouth daily.  30 tablet  3  . aspirin EC 81 MG tablet Take 81 mg by mouth daily.      . clopidogrel (PLAVIX) 75 MG tablet Take 1 tablet (75 mg total) by mouth daily with breakfast.  30 tablet  11  . hydrALAZINE (APRESOLINE) 25 MG tablet Take 1 tablet (25 mg total) by mouth every 8 (eight) hours.  90 tablet  3  . ibuprofen (ADVIL,MOTRIN) 200 MG tablet Take 400 mg by mouth every 6 (six) hours as needed for pain.      . pantoprazole (PROTONIX) 40 MG tablet take 1 tablet by mouth once daily  30 tablet  5  . simvastatin (ZOCOR) 10 MG tablet Take 1 tablet (10 mg total) by mouth daily at 6 PM.  30 tablet  11  . tamsulosin (FLOMAX) 0.4 MG CAPS Take 1 capsule (0.4 mg total) by mouth as needed.  30 capsule  5   No current facility-administered medications for this visit.    Allergies  Allergen Reactions  . Latex Rash    Review of Systems negative except from HPI and PMH  Physical Exam BP 125/80  Pulse 105 Well developed and well nourished in no acute distress HENT normal E scleral and icterus clear Neck Supple JVP flat; carotids brisk and full Clear to ausculation Device pocket well healed; without  hematoma or erythema.  There is superficial ecchymosis across the chest There is no tethering   Regular rate and rhythm, no murmurs gallops or rub Soft with active bowel sounds No clubbing cyanosis none Edema Alert and oriented, grossly normal motor and sensory function Skin Warm and Dry  Electrocardiogram demonstrates sinus rhythm with high-grade heart block and ventricular pacing  Assessment and  Plan

## 2012-12-31 ENCOUNTER — Ambulatory Visit (INDEPENDENT_AMBULATORY_CARE_PROVIDER_SITE_OTHER): Payer: BC Managed Care – PPO | Admitting: Cardiovascular Disease

## 2012-12-31 ENCOUNTER — Encounter: Payer: Self-pay | Admitting: Cardiovascular Disease

## 2012-12-31 VITALS — BP 132/81 | HR 86 | Ht 72.0 in | Wt 222.0 lb

## 2012-12-31 DIAGNOSIS — Z950 Presence of cardiac pacemaker: Secondary | ICD-10-CM

## 2012-12-31 DIAGNOSIS — I441 Atrioventricular block, second degree: Secondary | ICD-10-CM

## 2012-12-31 DIAGNOSIS — R079 Chest pain, unspecified: Secondary | ICD-10-CM

## 2012-12-31 DIAGNOSIS — I10 Essential (primary) hypertension: Secondary | ICD-10-CM

## 2012-12-31 DIAGNOSIS — Z955 Presence of coronary angioplasty implant and graft: Secondary | ICD-10-CM

## 2012-12-31 DIAGNOSIS — R0602 Shortness of breath: Secondary | ICD-10-CM

## 2012-12-31 DIAGNOSIS — Z9861 Coronary angioplasty status: Secondary | ICD-10-CM

## 2012-12-31 DIAGNOSIS — I2510 Atherosclerotic heart disease of native coronary artery without angina pectoris: Secondary | ICD-10-CM

## 2012-12-31 MED ORDER — AMLODIPINE BESYLATE 5 MG PO TABS: 5.0000 mg | ORAL_TABLET | Freq: Every day | ORAL | Status: DC

## 2012-12-31 NOTE — Patient Instructions (Addendum)
Your physician recommends that you schedule a follow-up appointment in: 3-4 WEEKS  Your physician has recommended you make the following change in your medication:   1) STOP Azilsartan-Chlorthalidone (EDARBYCLOR) 40-12.5 MG TABS IN 3 DAYS IF YOU DO NOT FEEL ANY BETTER 2) AFTER STOPPING Azilsartan-Chlorthalidone (EDARBYCLOR) 40-12.5 MG TABS START NORVASC 5MG  ONCE DAILY

## 2012-12-31 NOTE — Progress Notes (Signed)
Patient ID: [name], male   DOB: 05-Sep-1955, 57 y.o.   MRN: [id number]   SUBJECTIVE: Since Mr. Meriwether's last visit with me, he was admitted at [facility] and was found to have symptomatic Mobitz II AV block, for which he received a pacemaker. Prior to this, his symptoms were concerning for unstable angina and a repeat cardiac catheterization showed a patent RCA stent.   He had been walking up to 8 miles daily. He now gets chest pain when he walks and is unable to walk around the block. He gets short of breath now with minimal exertion. He's now only able to walk up to one-eighth of a mile.  He would like to try and come off his antihypertensive medication.  He is feeling depressed about this, as he enjoys exercising.  When he walks around [place], he feels fine but when he walks outside, he experiences the aforementioned symptoms. This baffles him.  A recent chest CT angiogram showed no pulmonary embolism, no pneumothorax, and no pericardial effusion.  He's now on Azilsartan-Chlorthalidone 40-12.5 mg daily. When he was on this in the past, he wasn't walking and had tolerated this medication. He checks his BP regularly and his SBP is in the 130 mmHg range.  He had also recently been on a combination of amlodipine, lisinopril, and hydralazine.  Allergies  Allergen Reactions  . Latex Rash    Current Outpatient Prescriptions  Medication Sig Dispense Refill  . aspirin EC 81 MG tablet Take 81 mg by mouth daily.      . Azilsartan-Chlorthalidone (EDARBYCLOR) 40-12.5 MG TABS Take 12.5 mg by mouth daily.  30 tablet  11  . clopidogrel (PLAVIX) 75 MG tablet Take 1 tablet (75 mg total) by mouth daily with breakfast.  30 tablet  11  . ibuprofen (ADVIL,MOTRIN) 200 MG tablet Take 400 mg by mouth every 6 (six) hours as needed for pain.      . pantoprazole (PROTONIX) 40 MG tablet take 1 tablet by mouth once daily  30 tablet  5  . simvastatin (ZOCOR) 10 MG tablet Take 1 tablet (10 mg total) by mouth  daily at 6 PM.  30 tablet  11  . tamsulosin (FLOMAX) 0.4 MG CAPS Take 1 capsule (0.4 mg total) by mouth as needed.  30 capsule  5   No current facility-administered medications for this visit.    Past Medical History  Diagnosis Date  . Hypertension   . GERD (gastroesophageal reflux disease)   . Colon polyps 2011    tubular adenoma by excisional biopsy during colonoscopy  . ADD (attention deficit disorder)     Rx-Adderall  . Degenerative disc disease, cervical   . Palpitations 2003    Holter in 2003: PACs and PVCs; negative stress nuclear test in 2005; right bundle branch block; echo in 2008-mild LVH, otherwise normal; 2008-negative stress nuclear test.  . Prostatitis     prostate calcifications by CT  . Sleep apnea     questionable diagnosis  . Pneumonia   . Anginal pain   . Complete heart block   . Coronary artery disease     Past Surgical History  Procedure Laterality Date  . Anterior cervical decomp/discectomy fusion    . Knee surgery      rt  . Colonoscopy w/ polypectomy  2011    tubular adenoma  . Esophagogastroduodenoscopy      Gastritis    History   Social History  . Marital Status: Married    Spouse Name:  N/A    Number of Children: 3  . Years of Education: N/A   Occupational History  . Not on file.   Social History Main Topics  . Smoking status: Former Smoker    Types: Cigarettes    Quit date: 12/20/2003  . Smokeless tobacco: Never Used  . Alcohol Use: 0.6 oz/week    1 Shots of liquor per week     Comment: occasional  . Drug Use: No     Comment: 30 + years ago - "crank, marjiuanna, ect."  . Sexual Activity: Yes    Birth Control/ Protection: Surgical   Other Topics Concern  . Not on file   Social History Narrative  . No narrative on file     Filed Vitals:   12/31/12 0839  BP: 132/81  Pulse: 86  Height: 6' (1.829 m)  Weight: 222 lb (100.699 kg)  SpO2: 97%    PHYSICAL EXAM General: NAD Neck: No JVD, no thyromegaly or thyroid  nodule.  Lungs: Clear to auscultation bilaterally with normal respiratory effort. CV: Nondisplaced PMI.  Chest wall w/o hematoma, well-healed incision. Heart regular S1/S2, no S3/S4, no murmur.  No peripheral edema.  No carotid bruit.  Normal pedal pulses.  Abdomen: Soft, nontender, no hepatosplenomegaly, no distention.  Neurologic: Alert and oriented x 3.  Psych: Normal affect. Extremities: No clubbing or cyanosis.   ECG: reviewed and available in electronic records.     ASSESSMENT AND PLAN: 1. CAD s/p PCI of RCA: given his most recent cath showing a widely patent stent, I do not feel any further cardiac testing is warranted. Continue ASA, Plavix, and simvastatin.  2. HTN: I wonder if his current symptoms are due to an intolerance of the Azilsartan-Chlorthalidone. He's only been taking it for 5 days. He will try this for a few more days but if he continues to experience symptoms, he will discontinue it and start amlodipine 5 mg daily. If his symptoms are due to coronary vasospasm, this would potentially help with this.  3. Chest pain: could be due to medication intolerance vs coronary vasospasm vs microvascular ischemia. Will make medication changes as noted above. If he does not tolerate amlodipine, I would consider an alternative antihypertensive strategy, but start isosorbide mononitrate.  4. Mobitz II AV block (symptomatic) s/p pacemaker: due to f/u with Dr. Ladona Ridgel in near future.    Prentice Docker, M.D., F.A.C.C.

## 2013-01-06 ENCOUNTER — Encounter: Payer: Self-pay | Admitting: Internal Medicine

## 2013-01-06 ENCOUNTER — Ambulatory Visit (INDEPENDENT_AMBULATORY_CARE_PROVIDER_SITE_OTHER): Payer: BC Managed Care – PPO | Admitting: Internal Medicine

## 2013-01-06 ENCOUNTER — Ambulatory Visit: Payer: BC Managed Care – PPO | Admitting: Internal Medicine

## 2013-01-06 VITALS — BP 130/80 | HR 88 | Ht 72.0 in | Wt 227.0 lb

## 2013-01-06 DIAGNOSIS — R001 Bradycardia, unspecified: Secondary | ICD-10-CM

## 2013-01-06 DIAGNOSIS — I441 Atrioventricular block, second degree: Secondary | ICD-10-CM

## 2013-01-06 DIAGNOSIS — I498 Other specified cardiac arrhythmias: Secondary | ICD-10-CM

## 2013-01-06 LAB — PACEMAKER DEVICE OBSERVATION
AL AMPLITUDE: 5.6 mv
AL IMPEDENCE PM: 582 Ohm
AL THRESHOLD: 0.5 V
ATRIAL PACING PM: 17
BATTERY VOLTAGE: 2.79 V
RV LEAD AMPLITUDE: 22.4 mv
RV LEAD IMPEDENCE PM: 692 Ohm
RV LEAD THRESHOLD: 0.375 V
VENTRICULAR PACING PM: 99

## 2013-01-06 NOTE — Patient Instructions (Addendum)
Your physician recommends that you schedule a follow-up appointment in: 1 week with Dr Johney Frame

## 2013-01-06 NOTE — Progress Notes (Signed)
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 male who presents today for electrophysiology followup.  Since his pacemaker was implanted he has not done well.  He reports symptoms of breathlessness and occasional chest discomfort.  CXR and CT have been unremarkable.  He reports uneasiness with exercise but occasionally at rest.  In the office today, this is demonstrated to occur when he V paces.  Past Medical History  Diagnosis Date  . Hypertension   . GERD (gastroesophageal reflux disease)   . Colon polyps 2011    tubular adenoma by excisional biopsy during colonoscopy  . ADD (attention deficit disorder)     Rx-Adderall  . Degenerative disc disease, cervical   . Palpitations 2003    Holter in 2003: PACs and PVCs; negative stress nuclear test in 2005; right bundle branch block; echo in 2008-mild LVH, otherwise normal; 2008-negative stress nuclear test.  . Prostatitis     prostate calcifications by CT  . Sleep apnea     questionable diagnosis  . Pneumonia   . Anginal pain   . Complete heart block   . Coronary artery disease    Past Surgical History  Procedure Laterality Date  . Anterior cervical decomp/discectomy fusion    . Knee surgery      rt  . Colonoscopy w/ polypectomy  2011    tubular adenoma  . Esophagogastroduodenoscopy      Gastritis  . Pacemaker insertion  12/17/12    MDT Adapta L implanted by Dr Johney Frame for mobitz II second degree AV block    Current Outpatient Prescriptions  Medication Sig Dispense Refill  . amLODipine (NORVASC) 5 MG tablet Take 1 tablet (5 mg total) by mouth daily.  90 tablet  1  . aspirin EC 81 MG tablet Take 81 mg by mouth daily.      . clopidogrel (PLAVIX) 75 MG tablet Take 1 tablet (75 mg total) by mouth daily with breakfast.  30 tablet  11  . ibuprofen (ADVIL,MOTRIN) 200 MG tablet Take 400 mg by mouth every 6 (six) hours as needed for pain.      . pantoprazole (PROTONIX) 40 MG tablet take 1 tablet  by mouth once daily  30 tablet  5  . simvastatin (ZOCOR) 10 MG tablet Take 1 tablet (10 mg total) by mouth daily at 6 PM.  30 tablet  11  . tamsulosin (FLOMAX) 0.4 MG CAPS Take 1 capsule (0.4 mg total) by mouth as needed.  30 capsule  5   No current facility-administered medications for this visit.    Physical Exam: Filed Vitals:   01/06/13 1114  BP: 130/80  Pulse: 88  Height: 6' (1.829 m)  Weight: 227 lb (102.967 kg)    GEN- The patient is anxious appearing, alert and oriented x 3 today.   Head- normocephalic, atraumatic Eyes-  Sclera clear, conjunctiva pink Ears- hearing intact Oropharynx- clear Lungs- Clear to ausculation bilaterally, normal work of breathing Chest- pacemaker pocket is healing nicely Heart- Regular rate and rhythm, no murmurs, rubs or gallops, PMI not laterally displaced GI- soft, NT, ND, + BS Extremities- no clubbing, cyanosis, or edema  Pacemaker interrogation- reviewed in detail today,  See PACEART report  Assessment and Plan:  1. Second degree AV block He is presently poorly tolerating V pacing.  This clearly reproduces his symptoms today. Interestingly with MVP, he has very rapid atrial sensing, though I am not convinced that this is PMT.  He is V  paced 99% today.  I have reprogrammed DDD with AV search on rather than MVP.  He appears to actually have less V pacing this way.  In addition, when I have him ASVP with an AV of , his symptoms improve.  I ambulated him today and symptoms were better. I will have him return in 1 week to further assess.  I also changed PVARP today to to prevent PMT. He may benefit from GXT or echo optimization depending on his reponse upon return

## 2013-01-06 NOTE — Progress Notes (Deleted)
Pacemaker check in clinic. Normal device function. Battery longevity 10 years. 1 mode switch lasting less than 1 minute. 5 ventricular high rate episodes---longest was 46 seconds. Changed mode to DDD and turned off MVP. Changed PAV delay from 180 to 120 and SAV delay from 160 to 120 ms. followup in 1 week with JA.

## 2013-01-07 ENCOUNTER — Encounter: Payer: Self-pay | Admitting: Internal Medicine

## 2013-01-10 ENCOUNTER — Ambulatory Visit: Payer: BC Managed Care – PPO | Admitting: Cardiovascular Disease

## 2013-01-15 ENCOUNTER — Encounter: Payer: Self-pay | Admitting: Internal Medicine

## 2013-01-15 ENCOUNTER — Ambulatory Visit (INDEPENDENT_AMBULATORY_CARE_PROVIDER_SITE_OTHER): Payer: BC Managed Care – PPO | Admitting: Internal Medicine

## 2013-01-15 VITALS — BP 170/96 | HR 69 | Ht 72.0 in | Wt 224.0 lb

## 2013-01-15 DIAGNOSIS — I441 Atrioventricular block, second degree: Secondary | ICD-10-CM

## 2013-01-15 DIAGNOSIS — R0600 Dyspnea, unspecified: Secondary | ICD-10-CM

## 2013-01-15 DIAGNOSIS — R0989 Other specified symptoms and signs involving the circulatory and respiratory systems: Secondary | ICD-10-CM

## 2013-01-15 DIAGNOSIS — I498 Other specified cardiac arrhythmias: Secondary | ICD-10-CM

## 2013-01-15 DIAGNOSIS — R0609 Other forms of dyspnea: Secondary | ICD-10-CM

## 2013-01-15 DIAGNOSIS — R001 Bradycardia, unspecified: Secondary | ICD-10-CM

## 2013-01-15 MED ORDER — DILTIAZEM HCL ER COATED BEADS 240 MG PO CP24: 240.0000 mg | ORAL_CAPSULE | Freq: Every day | ORAL | Status: DC

## 2013-01-15 NOTE — Patient Instructions (Signed)
Your physician recommends that you schedule a follow-up appointment in: 01/30/13 with Dr Sanjuan Dame  Your physician has recommended you make the following change in your medication:  1) Start Cardizem CD 240mg  daily

## 2013-01-15 NOTE — Progress Notes (Signed)
Exercise Treadmill Test  Pre-Exercise Testing Evaluation Rhythm: normal sinus  Rate: 72   PR:  .17 QRS:  .14  QT:  .41 QTc: .44            Test  Exercise Tolerance Test Ordering MD: Hillis Range, MD  Interpreting MD: Hillis Range, MD  Unique Test No:1 Treadmill:  1  Indication for ETT: exertional dyspnea  Contraindication to ETT: No   Stress Modality: exercise - treadmill  Cardiac Imaging Performed: non   Protocol: standard Bruce - maximal  Max BP: 151/90              Reason ETT Terminated:  fatigue    ST Segment Analysis At Rest: V paced With Exercise: V paced   Comments: This study demonstrated sinus rhythm with appropriate sinus rate response to exercise.  The patient however had second degree AV block.  With V pacing (programmed DDD with AV delay of 80 msec), the patient had alternating V pacing and V pseudofusion.  He was quite symptomatic with this with fatigue and chest discomfort.  PMT was also demonstrated with VA time of 290 msec.  Recommendations:  Programming to improve symptoms due to incomplete V pacing is difficult and complicated by short intrinsic conduction and PMT. Dr Graciela Husbands and I have reviewed his interrogation and ekgs at length today.  I think that our options or to slow intrinsic conduction or consider ablation.  I do not feel that we have additional pacing options presently.  After long discussion with the patient, I will start diltiazem CD 240mg  daily Return in 2 weeks for further assessment.

## 2013-01-17 ENCOUNTER — Telehealth: Payer: Self-pay | Admitting: *Deleted

## 2013-01-17 NOTE — Telephone Encounter (Signed)
Called and spoke with patient after recent programming and medication changes.  Patient states he is feeling better with reprogramming and addition of Diltiazem.  He worked today and walked a mile this morning without discomfort.    Follow up scheduled is scheduled for 2 weeks with Dr Johney Frame. Patient will call back before then with problems  Marena Chancy, BSN 01/17/2013 2:41 PM

## 2013-01-21 ENCOUNTER — Encounter: Payer: Self-pay | Admitting: Internal Medicine

## 2013-01-22 ENCOUNTER — Encounter: Payer: Self-pay | Admitting: Internal Medicine

## 2013-01-28 ENCOUNTER — Ambulatory Visit (INDEPENDENT_AMBULATORY_CARE_PROVIDER_SITE_OTHER): Payer: BC Managed Care – PPO | Admitting: Cardiovascular Disease

## 2013-01-28 ENCOUNTER — Encounter: Payer: Self-pay | Admitting: Cardiovascular Disease

## 2013-01-28 VITALS — BP 144/79 | HR 77 | Ht 72.0 in | Wt 228.0 lb

## 2013-01-28 DIAGNOSIS — I10 Essential (primary) hypertension: Secondary | ICD-10-CM

## 2013-01-28 DIAGNOSIS — R079 Chest pain, unspecified: Secondary | ICD-10-CM

## 2013-01-28 DIAGNOSIS — Z9861 Coronary angioplasty status: Secondary | ICD-10-CM

## 2013-01-28 DIAGNOSIS — R0989 Other specified symptoms and signs involving the circulatory and respiratory systems: Secondary | ICD-10-CM

## 2013-01-28 DIAGNOSIS — R0600 Dyspnea, unspecified: Secondary | ICD-10-CM

## 2013-01-28 DIAGNOSIS — Z955 Presence of coronary angioplasty implant and graft: Secondary | ICD-10-CM

## 2013-01-28 DIAGNOSIS — R5383 Other fatigue: Secondary | ICD-10-CM

## 2013-01-28 DIAGNOSIS — I2510 Atherosclerotic heart disease of native coronary artery without angina pectoris: Secondary | ICD-10-CM

## 2013-01-28 DIAGNOSIS — Z950 Presence of cardiac pacemaker: Secondary | ICD-10-CM

## 2013-01-28 DIAGNOSIS — R0609 Other forms of dyspnea: Secondary | ICD-10-CM

## 2013-01-28 DIAGNOSIS — R5381 Other malaise: Secondary | ICD-10-CM

## 2013-01-28 MED ORDER — DILTIAZEM HCL ER COATED BEADS 360 MG PO CP24: 360.0000 mg | ORAL_CAPSULE | Freq: Every day | ORAL | Status: DC

## 2013-01-28 NOTE — Patient Instructions (Signed)
Your physician recommends that you schedule a follow-up appointment in: 2 MONTHS  Your physician has recommended you make the following change in your medication:   1) INCREASE YOUR DILTIAZEM TO 360MG  ONCE DAILY

## 2013-01-28 NOTE — Progress Notes (Signed)
Patient ID: [name], male   DOB: 03-Nov-1955, 57 y.o.   MRN: [id number]      SUBJECTIVE: [name] continues to have problems after undergoing pacemaker placement, specifically with regards to chest discomfort, back discomfort, and fatigue. He saw Dr. Johney Frame and after his PVARP was increased to avoid pacemaker-mediated tachycardia, and his managed ventricular pacing was reprogrammed to DDD with AV search, he apparently felt somewhat better in the office, and it was deemed his symptoms were secondary to V-pacing.  He then underwent an exercise tolerance test which demonstrated both V-pacing and V-pseudofusion, with reproduction of his symptoms. PMT was also demonstrated.  After much discussion between Dr. Graciela Husbands and Dr. Johney Frame, it was felt that programming options were limited and he may require AV node ablation. The patient is not comfortable with this. He was then started on diltiazem 240 mg daily.  He felt well for about a week, and was able to walk 3-4 miles without difficulty. He was engaged in strenuous yardwork, and lifting heavy bags of sod and he did so without any significant difficulty.  This past Saturday, he experienced a change in his symptoms, and he has felt chest discomfort and fatigue ever since. He says these are the same symptoms he was experiencing before.  He denies any pain reminiscent to what he felt prior to his stent.    Allergies  Allergen Reactions  . Latex Rash    Current Outpatient Prescriptions  Medication Sig Dispense Refill  . amLODipine (NORVASC) 5 MG tablet Take 1 tablet (5 mg total) by mouth daily.  90 tablet  1  . aspirin EC 81 MG tablet Take 81 mg by mouth daily.      . clopidogrel (PLAVIX) 75 MG tablet Take 1 tablet (75 mg total) by mouth daily with breakfast.  30 tablet  11  . EDARBYCLOR 40-12.5 MG TABS       . ibuprofen (ADVIL,MOTRIN) 200 MG tablet Take 400 mg by mouth every 6 (six) hours as needed for pain.      . pantoprazole (PROTONIX)  40 MG tablet take 1 tablet by mouth once daily  30 tablet  5  . simvastatin (ZOCOR) 10 MG tablet Take 1 tablet (10 mg total) by mouth daily at 6 PM.  30 tablet  11  . tamsulosin (FLOMAX) 0.4 MG CAPS Take 1 capsule (0.4 mg total) by mouth as needed.  30 capsule  5  . diltiazem (CARDIZEM CD) 360 MG 24 hr capsule Take 1 capsule (360 mg total) by mouth daily.  90 capsule  1   No current facility-administered medications for this visit.    Past Medical History  Diagnosis Date  . Hypertension   . GERD (gastroesophageal reflux disease)   . Colon polyps 2011    tubular adenoma by excisional biopsy during colonoscopy  . ADD (attention deficit disorder)     Rx-Adderall  . Degenerative disc disease, cervical   . Palpitations 2003    Holter in 2003: PACs and PVCs; negative stress nuclear test in 2005; right bundle branch block; echo in 2008-mild LVH, otherwise normal; 2008-negative stress nuclear test.  . Prostatitis     prostate calcifications by CT  . Sleep apnea     questionable diagnosis  . Pneumonia   . Anginal pain   . Complete heart block   . Coronary artery disease     Past Surgical History  Procedure Laterality Date  . Anterior cervical decomp/discectomy fusion    . Knee surgery  rt  . Colonoscopy w/ polypectomy  2011    tubular adenoma  . Esophagogastroduodenoscopy      Gastritis  . Pacemaker insertion  12/17/12    MDT Adapta L implanted by Dr Johney Frame for mobitz II second degree AV block    History   Social History  . Marital Status: Married    Spouse Name: N/A    Number of Children: 3  . Years of Education: N/A   Occupational History  . Not on file.   Social History Main Topics  . Smoking status: Former Smoker    Types: Cigarettes    Quit date: 12/20/2003  . Smokeless tobacco: Never Used  . Alcohol Use: 0.6 oz/week    1 Shots of liquor per week     Comment: occasional  . Drug Use: No     Comment: 30 + years ago - "crank, marjiuanna, ect."  . Sexual  Activity: Yes    Birth Control/ Protection: Surgical   Other Topics Concern  . Not on file   Social History Narrative  . No narrative on file     Filed Vitals:   01/28/13 0908  BP: 144/79  Pulse: 77  Height: 6' (1.829 m)  Weight: 228 lb (103.42 kg)    PHYSICAL EXAM General: NAD Neck: No JVD, no thyromegaly or thyroid nodule.  Lungs: Clear to auscultation bilaterally with normal respiratory effort. CV: Nondisplaced PMI.  Heart regular rhythm, normal S1/S2, no S3/S4, soft I/VI systolic murmur at base.  No peripheral edema.  No carotid bruit.  Normal pedal pulses.  Abdomen: Soft, nontender, no hepatosplenomegaly, no distention.  Neurologic: Alert and oriented x 3.  Psych: Normal affect. Extremities: No clubbing or cyanosis.   ECG: reviewed and available in electronic records.      ASSESSMENT AND PLAN: 1. CAD s/p PCI of RCA: given his most recent cath showing a widely patent stent, I do not feel any further cardiac testing is warranted. Continue ASA, Plavix, and simvastatin.   2. HTN: relatively well controlled on amlodipine 5 mg daily. He is also on long-acting diltiazem 240 mg daily.  3. Mobitz II AV block (symptomatic) s/p pacemaker with associated chest discomfort and fatigue due to ventricular pacing: due to f/u with Dr. Johney Frame in two days. It appears that there are no further programming options available. He may require AV node ablation but the patient does not appear to be willing to undergo this. I will increase his diltiazem to 360 mg daily for the time being to see if this helps to alleviate his symptoms.    Prentice Docker, M.D., F.A.C.C.

## 2013-01-30 ENCOUNTER — Encounter: Payer: Self-pay | Admitting: Internal Medicine

## 2013-01-30 ENCOUNTER — Ambulatory Visit (INDEPENDENT_AMBULATORY_CARE_PROVIDER_SITE_OTHER): Payer: BC Managed Care – PPO | Admitting: Internal Medicine

## 2013-01-30 VITALS — BP 138/80 | HR 77 | Ht 72.0 in | Wt 229.8 lb

## 2013-01-30 DIAGNOSIS — I498 Other specified cardiac arrhythmias: Secondary | ICD-10-CM

## 2013-01-30 DIAGNOSIS — R001 Bradycardia, unspecified: Secondary | ICD-10-CM

## 2013-01-30 DIAGNOSIS — I441 Atrioventricular block, second degree: Secondary | ICD-10-CM

## 2013-01-30 LAB — PACEMAKER DEVICE OBSERVATION
AL AMPLITUDE: 5.6 mv
AL IMPEDENCE PM: 547 Ohm
AL THRESHOLD: 0.5 V
ATRIAL PACING PM: 6
BAMS-0001: 170 {beats}/min
BATTERY VOLTAGE: 2.79 V
RV LEAD AMPLITUDE: 22.4 mv
RV LEAD IMPEDENCE PM: 663 Ohm
RV LEAD THRESHOLD: 0.5 V
VENTRICULAR PACING PM: 99

## 2013-01-30 NOTE — Patient Instructions (Signed)
Your physician recommends that you schedule a follow-up appointment on: 02/10/13 at 1:45pm with Dr Johney Frame

## 2013-02-02 NOTE — Progress Notes (Signed)
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  Since his pacemaker was implanted he has not done well.  He reports symptoms of breathlessness and occasional chest discomfort.  CXR and CT have been unremarkable.  He reports uneasiness with exercise but occasionally at rest.  This has been clearly demonstrated to be a function of V pacing and AV timing.  I started low dose diltiazem which initially improved symptoms.  Unfortunately they then worsened.  He has seen Dr Purvis Sheffield who recommended increasing his diltiazem.  The patient has not done this yet.  Past Medical History  Diagnosis Date  . Hypertension   . GERD (gastroesophageal reflux disease)   . Colon polyps 2011    tubular adenoma by excisional biopsy during colonoscopy  . ADD (attention deficit disorder)     Rx-Adderall  . Degenerative disc disease, cervical   . Palpitations 2003    Holter in 2003: PACs and PVCs; negative stress nuclear test in 2005; right bundle branch block; echo in 2008-mild LVH, otherwise normal; 2008-negative stress nuclear test.  . Prostatitis     prostate calcifications by CT  . Sleep apnea     questionable diagnosis  . Pneumonia   . Anginal pain   . Complete heart block   . Coronary artery disease    Past Surgical History  Procedure Laterality Date  . Anterior cervical decomp/discectomy fusion    . Knee surgery      rt  . Colonoscopy w/ polypectomy  2011    tubular adenoma  . Esophagogastroduodenoscopy      Gastritis  . Pacemaker insertion  12/17/12    MDT Adapta L implanted by Dr Johney Frame for mobitz II second degree AV block    Current Outpatient Prescriptions  Medication Sig Dispense Refill  . amLODipine (NORVASC) 5 MG tablet Take 1 tablet (5 mg total) by mouth daily.  90 tablet  1  . aspirin EC 81 MG tablet Take 81 mg by mouth daily.      . clopidogrel (PLAVIX) 75 MG tablet Take 1 tablet (75 mg  total) by mouth daily with breakfast.  30 tablet  11  . diltiazem (CARDIZEM CD) 360 MG 24 hr capsule Currently taking 200 mg a day. Will start 360 mg today after Rx is picked up.      . ibuprofen (ADVIL,MOTRIN) 200 MG tablet Take 400 mg by mouth every 6 (six) hours as needed for pain.      . pantoprazole (PROTONIX) 40 MG tablet take 1 tablet by mouth once daily  30 tablet  5  . simvastatin (ZOCOR) 10 MG tablet Take 1 tablet (10 mg total) by mouth daily at 6 PM.  30 tablet  11  . tamsulosin (FLOMAX) 0.4 MG CAPS Take 1 capsule (0.4 mg total) by mouth as needed.  30 capsule  5   No current facility-administered medications for this visit.    Physical Exam: Filed Vitals:   01/30/13 1132  BP: 138/80  Pulse: 77  Height: 6' (1.829 m)  Weight: 229 lb 12.8 oz (104.237 kg)    GEN- The patient is anxious appearing, alert and oriented x 3 today.   Head- normocephalic, atraumatic Eyes-  Sclera clear, conjunctiva pink Ears- hearing intact Oropharynx- clear Lungs- Clear to ausculation bilaterally, normal work of breathing Chest- pacemaker pocket is healing nicely Heart- Regular rate and rhythm, no murmurs, rubs or gallops, PMI not laterally displaced GI-  soft, NT, ND, + BS Extremities- no clubbing, cyanosis, or edema  Pacemaker interrogation- reviewed in detail today,  See PACEART report  Assessment and Plan:  1. Second degree AV block He is presently poorly tolerating V pacing.  This clearly reproduces his symptoms today. Interestingly with MVP, he has very rapid atrial sensing, though I am not convinced that this is PMT.  He is V paced 99% today. His symptoms are better with a shorter AV delay.  MVP is off due to frequent prior PMT. I agree with Dr Purvis Sheffield than increasing the diltiazem is the right thing to do at this point. I will increase diltiazem to 360mg  daily at this time  Return in 1.5 weeks for further assessment. I may consider programming DDI 40 if his symptoms remain present  at that time.

## 2013-02-05 ENCOUNTER — Ambulatory Visit (INDEPENDENT_AMBULATORY_CARE_PROVIDER_SITE_OTHER): Payer: BC Managed Care – PPO | Admitting: Family Medicine

## 2013-02-05 ENCOUNTER — Encounter: Payer: Self-pay | Admitting: Family Medicine

## 2013-02-05 VITALS — BP 142/84 | Ht 72.0 in | Wt 231.4 lb

## 2013-02-05 DIAGNOSIS — Z0000 Encounter for general adult medical examination without abnormal findings: Secondary | ICD-10-CM

## 2013-02-05 DIAGNOSIS — Z23 Encounter for immunization: Secondary | ICD-10-CM

## 2013-02-05 DIAGNOSIS — Z125 Encounter for screening for malignant neoplasm of prostate: Secondary | ICD-10-CM

## 2013-02-05 MED ORDER — FLUTICASONE PROPIONATE 50 MCG/ACT NA SUSP: 2.0000 | Freq: Every day | NASAL | Status: DC

## 2013-02-05 NOTE — Progress Notes (Signed)
  Subjective:    Patient ID: [name], male    DOB: 06-18-1955, 57 y.o.   MRN: [id number]  HPI Patient is here today for a physical. Long discussion held regarding his cardiac condition he has a pacemaker in he has a functioning conduction system but he also has some complications regarding this and so therefore the pacemaker in his heart or not getting along well together they're trying different medications but so far not having great success he feels fatigue and tiredness with that he tries. He states he feels miserable and does not enjoy things. He denies being depressed not suicidal.  He would like the flu vaccine. Safety measures health measures healthy eating all discuss No concerns    Review of Systems  Constitutional: Negative for fever, activity change and appetite change.  HENT: Negative for congestion and rhinorrhea.   Eyes: Negative for discharge.  Respiratory: Negative for cough and wheezing.   Cardiovascular: Positive for chest pain.       Occasional chest pains with activity he's been followed by cardiology.  Gastrointestinal: Negative for vomiting, abdominal pain and blood in stool.  Genitourinary: Negative for frequency and difficulty urinating.  Musculoskeletal: Negative for neck pain.        Occasional edema in the ankles probably related to his blood pressure medicine.  Skin: Negative for rash.  Allergic/Immunologic: Negative for environmental allergies and food allergies.  Neurological: Negative for weakness and headaches.  Psychiatric/Behavioral: Negative for agitation.       Objective:   Physical Exam  Constitutional: He appears well-developed and well-nourished.  HENT:  Head: Normocephalic and atraumatic.  Right Ear: External ear normal.  Left Ear: External ear normal.  Nose: Nose normal.  Mouth/Throat: Oropharynx is clear and moist.  Eyes: EOM are normal. Pupils are equal, round, and reactive to light.  Neck: Normal range of motion. Neck supple.  No thyromegaly present.  Cardiovascular: Normal rate, regular rhythm and normal heart sounds.   No murmur heard. Pulmonary/Chest: Effort normal and breath sounds normal. No respiratory distress. He has no wheezes.  Abdominal: Soft. Bowel sounds are normal. He exhibits no distension and no mass. There is no tenderness.  Genitourinary:  Patient defers on prostate exam for today  Musculoskeletal: Normal range of motion. He exhibits no edema.  Lymphadenopathy:    He has no cervical adenopathy.  Neurological: He is alert. He exhibits normal muscle tone.  Skin: Skin is warm and dry. No erythema.  Psychiatric: He has a normal mood and affect. His behavior is normal. Judgment normal.          Assessment & Plan:  #1 wellness-labs ordered. Await results. Patient is to followup in several months time. He is encouraged to exercise on regular basis try to also do well with diet keep his weight down. No other particular problems He will followup with cardiology regarding his cardiac issues we will hopefully try to find a better regimen for him I also told the patient that I believe his doctors are trying their best for him, I also told him that I felt that they were very good doctors but he may need a second opinion to get a different approach I also told the patient that he may need to consider ablation but currently he does not want to do that.

## 2013-02-10 ENCOUNTER — Encounter: Payer: Self-pay | Admitting: Internal Medicine

## 2013-02-10 ENCOUNTER — Encounter: Payer: BC Managed Care – PPO | Admitting: Internal Medicine

## 2013-02-10 ENCOUNTER — Ambulatory Visit (INDEPENDENT_AMBULATORY_CARE_PROVIDER_SITE_OTHER): Payer: BC Managed Care – PPO | Admitting: Internal Medicine

## 2013-02-10 VITALS — BP 146/86 | HR 77 | Ht 72.0 in | Wt 231.8 lb

## 2013-02-10 DIAGNOSIS — I441 Atrioventricular block, second degree: Secondary | ICD-10-CM

## 2013-02-10 MED ORDER — DILTIAZEM HCL ER COATED BEADS 240 MG PO CP24: 240.0000 mg | ORAL_CAPSULE | Freq: Every day | ORAL | Status: DC

## 2013-02-10 NOTE — Progress Notes (Signed)
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   I think that some of his palpitations and SOB at rest are improved.  He now continues to have fatigue and SOB with exertion.  He has developed mild edema with diltiazem.  Past Medical History  Diagnosis Date  . Hypertension   . GERD (gastroesophageal reflux disease)   . Colon polyps 2011    tubular adenoma by excisional biopsy during colonoscopy  . ADD (attention deficit disorder)     Rx-Adderall  . Degenerative disc disease, cervical   . Palpitations 2003    Holter in 2003: PACs and PVCs; negative stress nuclear test in 2005; right bundle branch block; echo in 2008-mild LVH, otherwise normal; 2008-negative stress nuclear test.  . Prostatitis     prostate calcifications by CT  . Sleep apnea     questionable diagnosis  . Pneumonia   . Anginal pain   . Complete heart block   . Coronary artery disease    Past Surgical History  Procedure Laterality Date  . Anterior cervical decomp/discectomy fusion    . Knee surgery      rt  . Colonoscopy w/ polypectomy  2011    tubular adenoma  . Esophagogastroduodenoscopy      Gastritis  . Pacemaker insertion  12/17/12    MDT Adapta L implanted by Dr Johney Frame for mobitz II second degree AV block    Current Outpatient Prescriptions  Medication Sig Dispense Refill  . aspirin EC 81 MG tablet Take 81 mg by mouth daily.      . clopidogrel (PLAVIX) 75 MG tablet Take 1 tablet (75 mg total) by mouth daily with breakfast.  30 tablet  11  . diltiazem (CARDIZEM CD) 360 MG 24 hr capsule Take 360 mg by mouth daily. Currently taking 200 mg a day. Will start 360 mg today after Rx is picked up.      . fluticasone (FLONASE) 50 MCG/ACT nasal spray Place 2 sprays into the nose daily.  16 g  5  . ibuprofen (ADVIL,MOTRIN) 200 MG tablet Take 400 mg by mouth every 6 (six) hours as needed for pain.      . pantoprazole  (PROTONIX) 40 MG tablet take 1 tablet by mouth once daily  30 tablet  5  . simvastatin (ZOCOR) 10 MG tablet Take 1 tablet (10 mg total) by mouth daily at 6 PM.  30 tablet  11  . tamsulosin (FLOMAX) 0.4 MG CAPS Take 1 capsule (0.4 mg total) by mouth as needed.  30 capsule  5   No current facility-administered medications for this visit.    Physical Exam: Filed Vitals:   02/10/13 1346  BP: 146/86  Pulse: 77  Height: 6' (1.829 m)  Weight: 231 lb 12.8 oz (105.144 kg)    GEN- The patient is anxious appearing, alert and oriented x 3 today.   Head- normocephalic, atraumatic Eyes-  Sclera clear, conjunctiva pink Ears- hearing intact Oropharynx- clear Lungs- Clear to ausculation bilaterally, normal work of breathing Chest- pacemaker pocket is healing nicely Heart- Regular rate and rhythm, no murmurs, rubs or gallops, PMI not laterally displaced GI- soft, NT, ND, + BS Extremities- no clubbing, cyanosis, or edema  Pacemaker interrogation- reviewed in detail today,  See PACEART report  Assessment and Plan:  1. Second degree AV block Today, he has complete heart block with no escape. I have adjusted AV delays again to 150/120.  I will schedule for echo optimization.  Return in 2 weeks for further assessment.

## 2013-02-10 NOTE — Patient Instructions (Addendum)
Your physician has requested that you have an echocardiogram. Echocardiography is a painless test that uses sound waves to create images of your heart. It provides your doctor with information about the size and shape of your heart and how well your heart's chambers and valves are working. This procedure takes approximately one hour. There are no restrictions for this procedure.  Your physician has recommended you make the following change in your medication:  1) Decrease Diltiazem to 240mg  daily

## 2013-02-10 NOTE — Addendum Note (Signed)
Addended by: Dennis Bast F on: 02/10/2013 02:36 PM   Modules accepted: Orders, Medications

## 2013-02-14 LAB — PACEMAKER DEVICE OBSERVATION: BAMS-0001: 170 {beats}/min

## 2013-02-21 ENCOUNTER — Ambulatory Visit (HOSPITAL_COMMUNITY): Payer: BC Managed Care – PPO | Attending: Internal Medicine

## 2013-02-21 DIAGNOSIS — R0600 Dyspnea, unspecified: Secondary | ICD-10-CM

## 2013-02-21 DIAGNOSIS — R0989 Other specified symptoms and signs involving the circulatory and respiratory systems: Secondary | ICD-10-CM | POA: Insufficient documentation

## 2013-02-21 DIAGNOSIS — R0609 Other forms of dyspnea: Secondary | ICD-10-CM | POA: Insufficient documentation

## 2013-02-21 DIAGNOSIS — I441 Atrioventricular block, second degree: Secondary | ICD-10-CM | POA: Insufficient documentation

## 2013-02-21 DIAGNOSIS — R0602 Shortness of breath: Secondary | ICD-10-CM

## 2013-02-21 NOTE — Progress Notes (Signed)
Echocardiogram performed.  

## 2013-02-25 ENCOUNTER — Encounter: Payer: Self-pay | Admitting: Internal Medicine

## 2013-03-20 ENCOUNTER — Encounter: Payer: BC Managed Care – PPO | Admitting: Internal Medicine

## 2013-03-22 ENCOUNTER — Other Ambulatory Visit: Payer: Self-pay | Admitting: Family Medicine

## 2013-04-08 ENCOUNTER — Encounter: Payer: Self-pay | Admitting: Internal Medicine

## 2013-04-08 ENCOUNTER — Ambulatory Visit (INDEPENDENT_AMBULATORY_CARE_PROVIDER_SITE_OTHER): Payer: BC Managed Care – PPO | Admitting: Cardiovascular Disease

## 2013-04-08 ENCOUNTER — Encounter: Payer: Self-pay | Admitting: Cardiovascular Disease

## 2013-04-08 VITALS — BP 159/85 | HR 90 | Ht 72.0 in | Wt 228.0 lb

## 2013-04-08 DIAGNOSIS — R0600 Dyspnea, unspecified: Secondary | ICD-10-CM

## 2013-04-08 DIAGNOSIS — Z955 Presence of coronary angioplasty implant and graft: Secondary | ICD-10-CM

## 2013-04-08 DIAGNOSIS — I441 Atrioventricular block, second degree: Secondary | ICD-10-CM

## 2013-04-08 DIAGNOSIS — R0989 Other specified symptoms and signs involving the circulatory and respiratory systems: Secondary | ICD-10-CM

## 2013-04-08 DIAGNOSIS — Z9861 Coronary angioplasty status: Secondary | ICD-10-CM

## 2013-04-08 DIAGNOSIS — Z950 Presence of cardiac pacemaker: Secondary | ICD-10-CM

## 2013-04-08 DIAGNOSIS — R0609 Other forms of dyspnea: Secondary | ICD-10-CM

## 2013-04-08 DIAGNOSIS — I2510 Atherosclerotic heart disease of native coronary artery without angina pectoris: Secondary | ICD-10-CM

## 2013-04-08 DIAGNOSIS — I10 Essential (primary) hypertension: Secondary | ICD-10-CM

## 2013-04-08 MED ORDER — LABETALOL HCL 100 MG PO TABS: 100.0000 mg | ORAL_TABLET | Freq: Two times a day (BID) | ORAL | Status: DC

## 2013-04-08 NOTE — Patient Instructions (Signed)
Your physician wants you to follow-up in: 6 MONTH You will receive a reminder letter in the mail two months in advance. If you don't receive a letter, please call our office to schedule the follow-up appointment.  Your physician has recommended you make the following change in your medication:   1) STOP NORVASC 2) START TAKING LABETALOL 100MG  TWICE DAILY

## 2013-04-08 NOTE — Progress Notes (Signed)
Patient ID: [name], male   DOB: [birth year], 57 y.o.   MRN: [id number]      SUBJECTIVE: [name] returns for f/u of his CAD and pacemaker for second degree AV block. He experienced a lot of symptoms with V-pacing, and shortening his AV delay helped to alleviate some of his symptoms. He also apparently underwent echo optimization.  He is doing much better today. He seldom has symptoms and only when he runs about 100 yards. He is now able to walk up to 3 miles daily with his wife. 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 He has had some leg swelling at night, relieved by morning. He denies chest pain altogether.    Allergies  Allergen Reactions  . Latex Rash    Current Outpatient Prescriptions  Medication Sig Dispense Refill  . amLODipine (NORVASC) 5 MG tablet       . aspirin EC 81 MG tablet Take 81 mg by mouth daily.      . clopidogrel (PLAVIX) 75 MG tablet Take 1 tablet (75 mg total) by mouth daily with breakfast.  30 tablet  11  . diltiazem (CARDIZEM CD) 240 MG 24 hr capsule Take 1 capsule (240 mg total) by mouth daily.  90 capsule  3  . fluticasone (FLONASE) 50 MCG/ACT nasal spray Place 2 sprays into the nose daily.  16 g  5  . ibuprofen (ADVIL,MOTRIN) 200 MG tablet Take 400 mg by mouth every 6 (six) hours as needed for pain.      . pantoprazole (PROTONIX) 40 MG tablet take 1 tablet by mouth once daily  30 tablet  5  . simvastatin (ZOCOR) 10 MG tablet Take 1 tablet (10 mg total) by mouth daily at 6 PM.  30 tablet  11  . tamsulosin (FLOMAX) 0.4 MG CAPS capsule take 1 capsule by mouth once daily  30 capsule  5   No current facility-administered medications for this visit.    Past Medical History  Diagnosis Date  . Hypertension   . GERD (gastroesophageal reflux disease)   . Colon polyps 2011    tubular adenoma by excisional biopsy during colonoscopy  . ADD (attention deficit disorder)     Rx-Adderall  . Degenerative disc  disease, cervical   . Palpitations 2003    Holter in 2003: PACs and PVCs; negative stress nuclear test in 2005; right bundle branch block; echo in 2008-mild LVH, otherwise normal; 2008-negative stress nuclear test.  . Prostatitis     prostate calcifications by CT  . Sleep apnea     questionable diagnosis  . Pneumonia   . Anginal pain   . Complete heart block   . Coronary artery disease     Past Surgical History  Procedure Laterality Date  . Anterior cervical decomp/discectomy fusion    . Knee surgery      rt  . Colonoscopy w/ polypectomy  2011    tubular adenoma  . Esophagogastroduodenoscopy      Gastritis  . Pacemaker insertion  12/17/12    MDT Adapta L implanted by Dr Johney Frame for mobitz II second degree AV block    History   Social History  . Marital Status: Married    Spouse Name: N/A    Number of Children: 3  . Years of Education: N/A   Occupational History  . Not on file.   Social History Main Topics  . Smoking status: Former Smoker    Types: Cigarettes  Quit date: 12/20/2003  . Smokeless tobacco: Never Used  . Alcohol Use: 0.6 oz/week    1 Shots of liquor per week     Comment: occasional  . Drug Use: No     Comment: 30 + years ago - "crank, marjiuanna, ect."  . Sexual Activity: Yes    Birth Control/ Protection: Surgical   Other Topics Concern  . Not on file   Social History Narrative  . No narrative on file     Filed Vitals:   04/08/13 0817  BP: 159/85  Pulse: 90  Height: 6' (1.829 m)  Weight: 228 lb (103.42 kg)    PHYSICAL EXAM General: NAD Neck: No JVD, no thyromegaly or thyroid nodule.  Lungs: Clear to auscultation bilaterally with normal respiratory effort. CV: Nondisplaced PMI.  Heart regular S1/S2, no S3/S4, soft I/VI systolic murmur along left sternal border.  No peripheral edema.  No carotid bruit.  Normal pedal pulses.  Abdomen: Soft, nontender, no hepatosplenomegaly, no distention.  Neurologic: Alert and oriented x 3.  Psych:  Normal affect. Extremities: No clubbing or cyanosis.   ECG: reviewed and available in electronic records.      ASSESSMENT AND PLAN: 1. CAD s/p PCI of RCA: symptomatically stable. Continue ASA, Plavix, and simvastatin.  2. HTN: uncontrolled today on amlodipine 5 mg daily. He is also on long-acting diltiazem 240 mg daily. However, the patient attributes it to rushing to his appointment and being stressed starting a new business and it being the week of Christmas. I've asked him to check his BP at home a few times a week to see if further antihypertensive medication titration is warranted. Due to his leg swelling, I will d/c amlodipine and start labetalol 100 mg bid, and I've asked him to inform me if he experiences any untoward side effects. 3. Mobitz II AV block (symptomatic) s/p pacemaker: doing much better from this standpoint with shortening of the AV delay and echo optimization.  Dispo: f/u 6 months.   Prentice Docker, M.D., F.A.C.C.

## 2013-04-21 ENCOUNTER — Ambulatory Visit (INDEPENDENT_AMBULATORY_CARE_PROVIDER_SITE_OTHER): Payer: BC Managed Care – PPO | Admitting: Internal Medicine

## 2013-04-21 ENCOUNTER — Encounter: Payer: Self-pay | Admitting: Internal Medicine

## 2013-04-21 VITALS — BP 136/69 | HR 62 | Ht 72.0 in | Wt 230.0 lb

## 2013-04-21 DIAGNOSIS — I441 Atrioventricular block, second degree: Secondary | ICD-10-CM

## 2013-04-21 DIAGNOSIS — I498 Other specified cardiac arrhythmias: Secondary | ICD-10-CM

## 2013-04-21 DIAGNOSIS — Z79899 Other long term (current) drug therapy: Secondary | ICD-10-CM

## 2013-04-21 DIAGNOSIS — R001 Bradycardia, unspecified: Secondary | ICD-10-CM

## 2013-04-21 DIAGNOSIS — I10 Essential (primary) hypertension: Secondary | ICD-10-CM

## 2013-04-21 DIAGNOSIS — I2510 Atherosclerotic heart disease of native coronary artery without angina pectoris: Secondary | ICD-10-CM

## 2013-04-21 LAB — MDC_IDC_ENUM_SESS_TYPE_INCLINIC
Battery Impedance: 100 Ohm
Battery Remaining Longevity: 139 mo
Battery Voltage: 2.79 V
Brady Statistic AP VP Percent: 3 %
Brady Statistic AP VS Percent: 0 %
Brady Statistic AS VP Percent: 97 %
Brady Statistic AS VS Percent: 0 %
Date Time Interrogation Session: 20150105090809
Lead Channel Impedance Value: 529 Ohm
Lead Channel Impedance Value: 593 Ohm
Lead Channel Pacing Threshold Amplitude: 0.5 V
Lead Channel Pacing Threshold Amplitude: 0.75 V
Lead Channel Pacing Threshold Pulse Width: 0.4 ms
Lead Channel Pacing Threshold Pulse Width: 0.4 ms
Lead Channel Sensing Intrinsic Amplitude: 22.4 mV
Lead Channel Sensing Intrinsic Amplitude: 5.6 mV
Lead Channel Setting Pacing Amplitude: 2 V
Lead Channel Setting Pacing Amplitude: 2.5 V
Lead Channel Setting Pacing Pulse Width: 0.4 ms
Lead Channel Setting Sensing Sensitivity: 4 mV

## 2013-04-21 MED ORDER — HYDROCHLOROTHIAZIDE 12.5 MG PO CAPS: 12.5000 mg | ORAL_CAPSULE | Freq: Every day | ORAL | Status: DC

## 2013-04-21 NOTE — Patient Instructions (Signed)
   Stop Labetalol  Change to HCTZ 12.5mg  daily - new sent to pharm Continue all other medications.   Lab for BMET - due just prior to next office visit with Dr. Bronson Ing Office will contact with results via phone or letter.   Follow up in 2-3 months with Dr. Bronson Ing Your physician wants you to follow up in: 6 months.  You will receive a reminder letter in the mail one-two months in advance.  If you don't receive a letter, please call our office to schedule the follow up appointment - Dr. Rayann Heman

## 2013-04-21 NOTE — Progress Notes (Signed)
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 male who presents today for routine electrophysiology followup.  Since last being seen in our clinic, the patient reports doing very well.  His exercise tolerance has improved significantly since his echo optimization.  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  He recently switched off of amlodipine to labetalol due to BLE edema.  His swelling persists.  He reports urinary retention with labetalol.   Today, he denies symptoms of palpitations, chest pain, shortness of breath,  dizziness, presyncope, or syncope.  The patient is otherwise without complaint today.   Past Medical History  Diagnosis Date  . Hypertension   . GERD (gastroesophageal reflux disease)   . Colon polyps 2011    tubular adenoma by excisional biopsy during colonoscopy  . ADD (attention deficit disorder)     Rx-Adderall  . Degenerative disc disease, cervical   . Palpitations 2003    Holter in 2003: PACs and PVCs; negative stress nuclear test in 2005; right bundle branch block; echo in 2008-mild LVH, otherwise normal; 2008-negative stress nuclear test.  . Prostatitis     prostate calcifications by CT  . Sleep apnea     questionable diagnosis  . Pneumonia   . Anginal pain   . Complete heart block   . Coronary artery disease    Past Surgical History  Procedure Laterality Date  . Anterior cervical decomp/discectomy fusion    . Knee surgery      rt  . Colonoscopy w/ polypectomy  2011    tubular adenoma  . Esophagogastroduodenoscopy      Gastritis  . Pacemaker insertion  12/17/12    MDT Adapta L implanted by Dr Rayann Heman for mobitz II second degree AV block    Current Outpatient Prescriptions  Medication Sig Dispense Refill  . aspirin EC 81 MG tablet Take 81 mg by mouth daily.      . clopidogrel (PLAVIX) 75 MG tablet Take 1 tablet (75 mg total) by mouth daily with breakfast.  30 tablet  11  .  diltiazem (CARDIZEM CD) 240 MG 24 hr capsule Take 1 capsule (240 mg total) by mouth daily.  90 capsule  3  . fluticasone (FLONASE) 50 MCG/ACT nasal spray Place 2 sprays into the nose as needed.      Marland Kitchen ibuprofen (ADVIL,MOTRIN) 200 MG tablet Take 400 mg by mouth every 6 (six) hours as needed for pain.      . pantoprazole (PROTONIX) 40 MG tablet take 1 tablet by mouth once daily  30 tablet  5  . simvastatin (ZOCOR) 10 MG tablet Take 1 tablet (10 mg total) by mouth daily at 6 PM.  30 tablet  11  . tamsulosin (FLOMAX) 0.4 MG CAPS capsule take 1 capsule by mouth once daily  30 capsule  5   No current facility-administered medications for this visit.    Physical Exam: Filed Vitals:   04/21/13 0854  BP: 136/69  Pulse: 62  Height: 6' (1.829 m)  Weight: 230 lb (104.327 kg)  SpO2: 97%    GEN- The patient is well appearing, alert and oriented x 3 today.   Head- normocephalic, atraumatic Eyes-  Sclera clear, conjunctiva pink Ears- hearing intact Oropharynx- clear Lungs- Clear to ausculation bilaterally, normal work of breathing Chest- pacemaker pocket is well healed Heart- Regular rate and rhythm, no murmurs, rubs or gallops, PMI not laterally displaced GI- soft, NT, ND, + BS  Extremities- no clubbing, cyanosis, or edema  Pacemaker interrogation- reviewed in detail today,  See PACEART report  Assessment and Plan:  1. Second degree AV block We have finally found an optimal AV programming for him I think.  His exercise tolerance is improving and palpitations are much better. Normal pacemaker function See Pace Art report No changes today  2. HTN Given his concerns, I will stop labetalol today and start hctz 12.5mg  daily.  He will need to return for BMET and follow-up with Dr Bronson Ing in 2 months.    3. CAD Stable No change required today  Return to see me in 6 months

## 2013-05-12 ENCOUNTER — Telehealth: Payer: Self-pay | Admitting: *Deleted

## 2013-05-12 ENCOUNTER — Other Ambulatory Visit: Payer: Self-pay

## 2013-05-12 ENCOUNTER — Telehealth: Payer: Self-pay

## 2013-05-12 MED ORDER — NITROGLYCERIN 0.4 MG SL SUBL: 0.4000 mg | SUBLINGUAL_TABLET | SUBLINGUAL | Status: DC | PRN

## 2013-05-12 NOTE — Telephone Encounter (Signed)
Clarification of my previous note: Mr.Brandenburg was never told to call another cardiolgy office in an attempt to make an apt with any other cardiologist.Dr.Koneswaran was specific in instructions.He was to take one NTG SL q 5 minutes apart x 3 and then go to an ED for care if pain was unrelieved.I placed the order for NTG this am and was awaiting patients call back.Instead he calls [place] office and tells staff he was told to try and get apt with Dr.Allred which was not the case.I am forwarding this to Ellwood Dense RN

## 2013-05-12 NOTE — Telephone Encounter (Signed)
Patient of Dr.Koneswaran and Allred who calls with c/o 4 days chest pressure unrelieved with alka seltzer, Denies SOB,radiation  of pain ,N/V. 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 He will call back and report efficacy. Dr.koneswaran also stated the patients pacemaker settings had given him discomfort recently and Dr.Allred had adjusted settings.He suggested patient may need to se Dr.Allred again for this.   Will await call back from patient

## 2013-05-12 NOTE — Telephone Encounter (Signed)
Pt states he has been having CP for 4 days as a constatnt, pain maybe a 3 on scale 1-10 radiates from chest to back with SOB. Sent Pt directly to nurse to triage

## 2013-05-12 NOTE — Telephone Encounter (Signed)
Per telephone call from patient.  States he has been having chest pain/discomfort like before when his pacemaker was "messed up".  This has been occuring for approximately 4 days.  He reports thumping and fluttering like before and also some pain.  He has taken SL Ntg without relief of the discomfort.  He called Dr Kristian Covey office and was told to call here to see if Dr Rayann Heman could see him.  Discussed information with Janan Halter, RN and Dr Rayann Heman.  Dr Rayann Heman wants to verify pt is taking meds as prescribed. Pt is to be added onto the schedule to see Dr Rayann Heman 2/2 at 12:15 which is the next day Dr Rayann Heman is in the office. He is aware.  States he may call back to Dr Kristian Covey office to see if he can be evaluated there.

## 2013-05-13 ENCOUNTER — Other Ambulatory Visit: Payer: Self-pay

## 2013-05-13 ENCOUNTER — Emergency Department (HOSPITAL_COMMUNITY): Payer: BC Managed Care – PPO

## 2013-05-13 ENCOUNTER — Telehealth: Payer: Self-pay

## 2013-05-13 ENCOUNTER — Emergency Department (HOSPITAL_COMMUNITY)
Admission: EM | Admit: 2013-05-13 | Discharge: 2013-05-13 | Disposition: A | Discharge status: Home / Self Care | Payer: BC Managed Care – PPO | Attending: Emergency Medicine | Admitting: Emergency Medicine

## 2013-05-13 ENCOUNTER — Encounter (HOSPITAL_COMMUNITY): Payer: Self-pay | Admitting: Emergency Medicine

## 2013-05-13 DIAGNOSIS — R0789 Other chest pain: Secondary | ICD-10-CM

## 2013-05-13 DIAGNOSIS — Z8739 Personal history of other diseases of the musculoskeletal system and connective tissue: Secondary | ICD-10-CM | POA: Insufficient documentation

## 2013-05-13 DIAGNOSIS — R0602 Shortness of breath: Secondary | ICD-10-CM | POA: Insufficient documentation

## 2013-05-13 DIAGNOSIS — I2510 Atherosclerotic heart disease of native coronary artery without angina pectoris: Secondary | ICD-10-CM | POA: Insufficient documentation

## 2013-05-13 DIAGNOSIS — Z860100 Personal history of colon polyps, unspecified: Secondary | ICD-10-CM | POA: Insufficient documentation

## 2013-05-13 DIAGNOSIS — Z79899 Other long term (current) drug therapy: Secondary | ICD-10-CM | POA: Insufficient documentation

## 2013-05-13 DIAGNOSIS — Z8701 Personal history of pneumonia (recurrent): Secondary | ICD-10-CM | POA: Insufficient documentation

## 2013-05-13 DIAGNOSIS — R42 Dizziness and giddiness: Secondary | ICD-10-CM | POA: Insufficient documentation

## 2013-05-13 DIAGNOSIS — Z87448 Personal history of other diseases of urinary system: Secondary | ICD-10-CM | POA: Insufficient documentation

## 2013-05-13 DIAGNOSIS — I209 Angina pectoris, unspecified: Secondary | ICD-10-CM | POA: Insufficient documentation

## 2013-05-13 DIAGNOSIS — K219 Gastro-esophageal reflux disease without esophagitis: Secondary | ICD-10-CM | POA: Insufficient documentation

## 2013-05-13 DIAGNOSIS — Z87891 Personal history of nicotine dependence: Secondary | ICD-10-CM | POA: Insufficient documentation

## 2013-05-13 DIAGNOSIS — IMO0002 Reserved for concepts with insufficient information to code with codable children: Secondary | ICD-10-CM | POA: Insufficient documentation

## 2013-05-13 DIAGNOSIS — R071 Chest pain on breathing: Secondary | ICD-10-CM | POA: Insufficient documentation

## 2013-05-13 DIAGNOSIS — Z7902 Long term (current) use of antithrombotics/antiplatelets: Secondary | ICD-10-CM | POA: Insufficient documentation

## 2013-05-13 DIAGNOSIS — F988 Other specified behavioral and emotional disorders with onset usually occurring in childhood and adolescence: Secondary | ICD-10-CM | POA: Insufficient documentation

## 2013-05-13 DIAGNOSIS — Z950 Presence of cardiac pacemaker: Secondary | ICD-10-CM | POA: Insufficient documentation

## 2013-05-13 DIAGNOSIS — I10 Essential (primary) hypertension: Secondary | ICD-10-CM | POA: Insufficient documentation

## 2013-05-13 DIAGNOSIS — Z91040 Latex allergy status: Secondary | ICD-10-CM | POA: Insufficient documentation

## 2013-05-13 DIAGNOSIS — Z7982 Long term (current) use of aspirin: Secondary | ICD-10-CM | POA: Insufficient documentation

## 2013-05-13 LAB — CBC WITH DIFFERENTIAL/PLATELET
Basophils Absolute: 0 10*3/uL (ref 0.0–0.1)
Basophils Relative: 0 % (ref 0–1)
Eosinophils Absolute: 0.1 10*3/uL (ref 0.0–0.7)
Eosinophils Relative: 1 % (ref 0–5)
HCT: 48.5 % (ref 39.0–52.0)
Hemoglobin: 16.6 g/dL (ref 13.0–17.0)
Lymphocytes Relative: 27 % (ref 12–46)
Lymphs Abs: 1.7 10*3/uL (ref 0.7–4.0)
MCH: 29.3 pg (ref 26.0–34.0)
MCHC: 34.2 g/dL (ref 30.0–36.0)
MCV: 85.7 fL (ref 78.0–100.0)
Monocytes Absolute: 0.6 10*3/uL (ref 0.1–1.0)
Monocytes Relative: 9 % (ref 3–12)
Neutro Abs: 3.9 10*3/uL (ref 1.7–7.7)
Neutrophils Relative %: 63 % (ref 43–77)
Platelets: 257 10*3/uL (ref 150–400)
RBC: 5.66 MIL/uL (ref 4.22–5.81)
RDW: 13.5 % (ref 11.5–15.5)
WBC: 6.3 10*3/uL (ref 4.0–10.5)

## 2013-05-13 LAB — BASIC METABOLIC PANEL
BUN: 12 mg/dL (ref 6–23)
CO2: 28 mEq/L (ref 19–32)
Calcium: 9.9 mg/dL (ref 8.4–10.5)
Chloride: 99 mEq/L (ref 96–112)
Creatinine, Ser: 1.03 mg/dL (ref 0.50–1.35)
GFR calc Af Amer: >90 mL/min (ref >90)
GFR calc non Af Amer: 78 mL/min — ABNORMAL LOW (ref >90)
Glucose, Bld: 118 mg/dL — ABNORMAL HIGH (ref 70–99)
Potassium: 4 mEq/L (ref 3.7–5.3)
Sodium: 141 mEq/L (ref 137–147)

## 2013-05-13 LAB — TROPONIN I
Troponin I: <0.3 ng/mL (ref <0.30)
Troponin I: <0.3 ng/mL (ref <0.30)

## 2013-05-13 MED ORDER — MORPHINE SULFATE 4 MG/ML IJ SOLN
4.0000 mg | INTRAMUSCULAR | Status: DC | PRN
  Administered 2013-05-13: 4 mg via INTRAVENOUS
  Filled 2013-05-13: qty 1

## 2013-05-13 MED ORDER — ASPIRIN 81 MG PO CHEW
243.0000 mg | CHEWABLE_TABLET | Freq: Once | ORAL | Status: AC
  Administered 2013-05-13: 243 mg via ORAL
  Filled 2013-05-13: qty 3

## 2013-05-13 MED ORDER — ISOSORBIDE DINITRATE 10 MG PO TABS: 10.0000 mg | ORAL_TABLET | Freq: Three times a day (TID) | ORAL | Status: DC

## 2013-05-13 MED ORDER — NITROGLYCERIN 0.4 MG SL SUBL
0.4000 mg | SUBLINGUAL_TABLET | SUBLINGUAL | Status: DC | PRN
  Administered 2013-05-13 (×2): 0.4 mg via SUBLINGUAL
  Filled 2013-05-13: qty 25

## 2013-05-13 NOTE — Telephone Encounter (Signed)
Could see Dr. Caryl Comes, as I believe he has seen him before.

## 2013-05-13 NOTE — ED Notes (Signed)
Pt c/o itching and redness to IV site and right upper arm after receiving iv morphine. edp notifed.

## 2013-05-13 NOTE — Telephone Encounter (Signed)
Message copied by Bernita Raisin on Tue May 13, 2013  2:51 PM ------      Message from: Kate Sable A      Created: Tue May 13, 2013  1:42 PM       Pt evaluated in ED. Normal troponin. Started on Isordil 10 mg tid. Please see if you can get an earlier appt for him with Dr. Rayann Heman, possibly this week.      Thanks. ------

## 2013-05-13 NOTE — Telephone Encounter (Signed)
FYI Dr.Koneswaran : Dr.Klein will see patient Thursday per his scheduler [facility] in [place] office. They will call patient and notify of time

## 2013-05-13 NOTE — ED Notes (Signed)
Left sided cp radiating to center back x 3 days with sob and lightheadedness.  Called cardiologist who started Nitro SL yesterday.  Reports took Nitro SL x 3 yesterday and one today with no relief and worsening lightheadedness.

## 2013-05-13 NOTE — Telephone Encounter (Signed)
Patients apt is this Monday 05/19/13 .Dr.Allred is not in office this week.  Will message Dr.Koneswaran and see if he wants Dr.Taylor or Dr.Klein to see patient this week

## 2013-05-13 NOTE — Discharge Instructions (Signed)
Take the new prescription as directed.  Call your regular Cardiologist today to schedule a follow up appointment within the next 2 days. Dr. Marthann Schiller office will try to move up your appointment with Dr. Rayann Heman for this week. If you do not hear otherwise, keep your appointment with Dr. Rayann Heman on 05/19/13 as previously scheduled. Return to the Emergency Department immediately sooner if worsening.

## 2013-05-13 NOTE — ED Provider Notes (Signed)
CSN: [id number]     Arrival date & time 05/13/13  1011 History   First MD Initiated Contact with Patient 05/13/13 1028     Chief Complaint  Patient presents with  . Chest Pain    HPI Pt was seen at 1030. Per pt, c/o gradual onset and persistence of constant waxing and waning chest "pain" for the past 4 to 5 days. Pt describes the CP as "constant pressure," which radiates into his mid-back, and has been associated with SOB and lightheadedness. States his symptoms worsen with physical exertion (walking) and improve with resting. States the CP never goes away completely. Pt states he called his Cards MD yesterday, was rx ntg SL and told go to the ED. Pt states he "is hardheaded" and did not come to the ED until today because his "wife made him." States the ntg SL "somewhat" improves his CP symptoms but "makes me lightheaded." States he had a near syncopal episode after taking SL ntg this morning and starting to get into the shower. Denies syncope, no focal motor weakness, no tingling/numbness in extremities, no palpitations, no cough, no abd pain, no N/V/D, no fevers.    Cards: Dr. Raliegh Ip Past Medical History  Diagnosis Date  . Hypertension   . GERD (gastroesophageal reflux disease)   . Colon polyps 2011    tubular adenoma by excisional biopsy during colonoscopy  . ADD (attention deficit disorder)     Rx-Adderall  . Degenerative disc disease, cervical   . Palpitations 2003    Holter in 2003: PACs and PVCs; negative stress nuclear test in 2005; right bundle branch block; echo in 2008-mild LVH, otherwise normal; 2008-negative stress nuclear test.  . Prostatitis     prostate calcifications by CT  . Sleep apnea     questionable diagnosis  . Pneumonia   . Anginal pain   . Complete heart block   . Coronary artery disease    Past Surgical History  Procedure Laterality Date  . Anterior cervical decomp/discectomy fusion    . Knee surgery      rt  . Colonoscopy w/ polypectomy  2011    tubular  adenoma  . Esophagogastroduodenoscopy      Gastritis  . Pacemaker insertion  12/17/12    MDT Adapta L implanted by Dr Rayann Heman for mobitz II second degree AV block   Family History  Problem Relation Age of Onset  . Heart disease Mother   . Hypertension Mother     Carotid disease   History  Substance Use Topics  . Smoking status: Former Smoker    Types: Cigarettes    Quit date: 12/20/2003  . Smokeless tobacco: Never Used  . Alcohol Use: 0.6 oz/week    1 Shots of liquor per week     Comment: occasional    Review of Systems ROS: Statement: All systems negative except as marked or noted in the HPI; Constitutional: Negative for fever and chills. ; ; Eyes: Negative for eye pain, redness and discharge. ; ; ENMT: Negative for ear pain, hoarseness, nasal congestion, sinus pressure and sore throat. ; ; Cardiovascular: +CP, SOB. Negative for palpitations, diaphoresis, and peripheral edema. ; ; Respiratory: Negative for cough, wheezing and stridor. ; ; Gastrointestinal: Negative for nausea, vomiting, diarrhea, abdominal pain, blood in stool, hematemesis, jaundice and rectal bleeding. . ; ; Genitourinary: Negative for dysuria, flank pain and hematuria. ; ; Musculoskeletal: Negative for back pain and neck pain. Negative for swelling and trauma.; ; Skin: Negative for pruritus, rash, abrasions,  blisters, bruising and skin lesion.; ; Neuro: +lightheadedness, near syncope. Negative for headache and neck stiffness. Negative for weakness, altered level of consciousness , altered mental status, extremity weakness, paresthesias, involuntary movement, seizure and syncope.       Allergies  Morphine and related and Latex  Home Medications   Current Outpatient Rx  Name  Route  Sig  Dispense  Refill  . aspirin EC 81 MG tablet   Oral   Take 81 mg by mouth daily.         . clopidogrel (PLAVIX) 75 MG tablet   Oral   Take 1 tablet (75 mg total) by mouth daily with breakfast.   30 tablet   11   .  diltiazem (CARDIZEM CD) 240 MG 24 hr capsule   Oral   Take 240 mg by mouth at bedtime.         . hydrochlorothiazide (MICROZIDE) 12.5 MG capsule   Oral   Take 12.5 mg by mouth daily.         Marland Kitchen ibuprofen (ADVIL,MOTRIN) 200 MG tablet   Oral   Take 400 mg by mouth every 8 (eight) hours as needed.         . labetalol (NORMODYNE) 100 MG tablet   Oral   Take 60 mg by mouth 2 (two) times daily.         . nitroGLYCERIN (NITROSTAT) 0.4 MG SL tablet   Sublingual   Place 1 tablet (0.4 mg total) under the tongue every 5 (five) minutes as needed for chest pain.   90 tablet   3   . pantoprazole (PROTONIX) 40 MG tablet      take 1 tablet by mouth once daily   30 tablet   5   . simvastatin (ZOCOR) 10 MG tablet   Oral   Take 1 tablet (10 mg total) by mouth daily at 6 PM.   30 tablet   11   . tamsulosin (FLOMAX) 0.4 MG CAPS capsule      take 1 capsule at bedtime         . fluticasone (FLONASE) 50 MCG/ACT nasal spray   Nasal   Place 2 sprays into the nose as needed.         . isosorbide dinitrate (ISORDIL) 10 MG tablet   Oral   Take 1 tablet (10 mg total) by mouth 3 (three) times daily.   30 tablet   0    BP 147/73  Pulse 64  Temp(Src) 98.3 F (36.8 C) (Oral)  Resp 18  Ht 6' (1.829 m)  Wt 225 lb (102.059 kg)  BMI 30.51 kg/m2  SpO2 97% Physical Exam 1035: Physical examination:  Nursing notes reviewed; Vital signs and O2 SAT reviewed;  Constitutional: Well developed, Well nourished, Well hydrated, In no acute distress; Head:  Normocephalic, atraumatic; Eyes: EOMI, PERRL, No scleral icterus; ENMT: Mouth and pharynx normal, Mucous membranes moist; Neck: Supple, Full range of motion, No lymphadenopathy; Cardiovascular: Regular rate and rhythm, No gallop; Respiratory: Breath sounds clear & equal bilaterally, No wheezes.  Speaking full sentences with ease, Normal respiratory effort/excursion; Chest: Nontender, Movement normal; Abdomen: Soft, Nontender, Nondistended,  Normal bowel sounds; Genitourinary: No CVA tenderness; Extremities: Pulses normal, No tenderness, No edema, No calf edema or asymmetry.; Neuro: AA&Ox3, Major CN grossly intact.  Speech clear. No gross focal motor or sensory deficits in extremities. Climbs on and off stretcher easily by himself. Gait steady.; Skin: Color normal, Warm, Dry.   ED Course  Procedures  EKG Interpretation    Date/Time:  Tuesday May 13 2013 10:08:32 EST Ventricular Rate:  65 PR Interval:  134 QRS Duration: 176 QT Interval:  476 QTC Calculation: 495 R Axis:   -54 Text Interpretation:  Atrial-sensed ventricular-paced rhythm Abnormal ECG When compared with ECG of 15-Jan-2013 15:21, Vent. rate has decreased BY  20 BPM Confirmed by [facility]  MD, Oceana Walthall [phone]) on 05/13/2013 4:24:36 PM            MDM  MDM Reviewed: previous chart, nursing note and vitals Reviewed previous: labs and ECG Interpretation: labs, ECG and x-ray    Results for orders placed during the hospital encounter of 05/13/13  CBC WITH DIFFERENTIAL      Result Value Range   WBC 6.3  4.0 - 10.5 K/uL   RBC 5.66  4.22 - 5.81 MIL/uL   Hemoglobin 16.6  13.0 - 17.0 g/dL   HCT 41.7  40.8 - 14.4 %   MCV 85.7  78.0 - 100.0 fL   MCH 29.3  26.0 - 34.0 pg   MCHC 34.2  30.0 - 36.0 g/dL   RDW 81.8  56.3 - 14.9 %   Platelets 257  150 - 400 K/uL   Neutrophils Relative % 63  43 - 77 %   Neutro Abs 3.9  1.7 - 7.7 K/uL   Lymphocytes Relative 27  12 - 46 %   Lymphs Abs 1.7  0.7 - 4.0 K/uL   Monocytes Relative 9  3 - 12 %   Monocytes Absolute 0.6  0.1 - 1.0 K/uL   Eosinophils Relative 1  0 - 5 %   Eosinophils Absolute 0.1  0.0 - 0.7 K/uL   Basophils Relative 0  0 - 1 %   Basophils Absolute 0.0  0.0 - 0.1 K/uL  BASIC METABOLIC PANEL      Result Value Range   Sodium 141  137 - 147 mEq/L   Potassium 4.0  3.7 - 5.3 mEq/L   Chloride 99  96 - 112 mEq/L   CO2 28  19 - 32 mEq/L   Glucose, Bld 118 (*) 70 - 99 mg/dL   BUN 12  6 - 23 mg/dL    Creatinine, Ser 7.02  0.50 - 1.35 mg/dL   Calcium 9.9  8.4 - 63.7 mg/dL   GFR calc non Af Amer 78 (*) >90 mL/min   GFR calc Af Amer >90  >90 mL/min  TROPONIN I      Result Value Range   Troponin I <0.30  <0.30 ng/mL  TROPONIN I      Result Value Range   Troponin I <0.30  <0.30 ng/mL   Dg Chest Port 1 View 05/13/2013   CLINICAL DATA:  Chest pain.  EXAM: PORTABLE CHEST - 1 VIEW  COMPARISON:  Chest radiograph 12/20/2012.  FINDINGS: Dual lead pacer apparatus overlies the left hemi thorax, leads are grossly stable in position. Anterior cervical spinal fusion hardware, incompletely evaluated. Stable cardiac and mediastinal contours. Low lung volumes. No consolidative pulmonary opacities. No pleural effusion or pneumothorax. Regional skeleton is unremarkable.  IMPRESSION: Low lung volumes.  No acute cardiopulmonary process.   Electronically Signed   By: Annia Belt M.D.   On: 05/13/2013 11:04     1335:  EKG paced. Troponin normal. No change in CP after ASA, ntg SL and morphine. T/C to Cardiology Dr. Kirtland Bouchard, case discussed, including:  HPI, pertinent PM/SHx, VS/PE, dx testing, ED course and treatment:  Doubts CP is cardiac in origin at  this time given normal troponin after 4 to 5 days of constant symptoms; requests to check 2nd troponin, if normal can d/c home with rx isordil 10mg  TID; his office will try to arrange sooner f/u with his EP MD (pt already has an appt with Dr. Rayann Heman on 05/19/13) because previously when pt had complained of chest pain his pacemaker settings needed changing. Dx and testing, as well as d/w Cards Dr. Raliegh Ip, d/w pt and family.  Questions answered.  Verb understanding, agreeable to plan.  1450:  2nd troponin negative. VS remain stable and pt states he feels "ok." Pt wants to go home now. Dx and testing d/w pt and family.  Questions answered.  Verb understanding, agreeable to d/c home with outpt f/u.        Alfonzo Feller, DO 05/15/13 2149

## 2013-05-14 ENCOUNTER — Other Ambulatory Visit: Payer: Self-pay | Admitting: Family Medicine

## 2013-05-15 ENCOUNTER — Ambulatory Visit: Payer: BC Managed Care – PPO | Admitting: Internal Medicine

## 2013-05-19 ENCOUNTER — Encounter: Payer: Self-pay | Admitting: Internal Medicine

## 2013-05-19 ENCOUNTER — Ambulatory Visit: Payer: BC Managed Care – PPO | Admitting: Internal Medicine

## 2013-05-19 ENCOUNTER — Ambulatory Visit (INDEPENDENT_AMBULATORY_CARE_PROVIDER_SITE_OTHER): Payer: BC Managed Care – PPO | Admitting: Internal Medicine

## 2013-05-19 VITALS — BP 139/82 | HR 72 | Ht 72.0 in | Wt 220.0 lb

## 2013-05-19 DIAGNOSIS — I2510 Atherosclerotic heart disease of native coronary artery without angina pectoris: Secondary | ICD-10-CM

## 2013-05-19 DIAGNOSIS — I498 Other specified cardiac arrhythmias: Secondary | ICD-10-CM

## 2013-05-19 DIAGNOSIS — R079 Chest pain, unspecified: Secondary | ICD-10-CM

## 2013-05-19 DIAGNOSIS — I441 Atrioventricular block, second degree: Secondary | ICD-10-CM

## 2013-05-19 DIAGNOSIS — I10 Essential (primary) hypertension: Secondary | ICD-10-CM

## 2013-05-19 DIAGNOSIS — R001 Bradycardia, unspecified: Secondary | ICD-10-CM

## 2013-05-19 LAB — MDC_IDC_ENUM_SESS_TYPE_INCLINIC
Battery Impedance: 100 Ohm
Battery Remaining Longevity: 140 mo
Battery Voltage: 2.79 V
Brady Statistic AP VP Percent: 6 %
Brady Statistic AP VS Percent: 0 %
Brady Statistic AS VP Percent: 94 %
Brady Statistic AS VS Percent: 0 %
Date Time Interrogation Session: 20150202125156
Lead Channel Impedance Value: 515 Ohm
Lead Channel Impedance Value: 606 Ohm
Lead Channel Sensing Intrinsic Amplitude: 15.67 mV
Lead Channel Setting Pacing Amplitude: 2 V
Lead Channel Setting Pacing Amplitude: 2.5 V
Lead Channel Setting Pacing Pulse Width: 0.4 ms
Lead Channel Setting Sensing Sensitivity: 4 mV

## 2013-05-19 NOTE — Patient Instructions (Signed)
Your physician recommends that you schedule a follow-up appointment in: 2 weeks withDr Allred  Your physician has recommended you make the following change in your medication:  1) Stop HCTZ

## 2013-05-25 NOTE — Progress Notes (Signed)
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  Since last being seen in our clinic, the patient reports worsening chest discomfort.  He thinks that this is somehow due to hctz given its temporal relationship.  Today, he denies symptoms of palpitations, shortness of breath,  dizziness, presyncope, or syncope.  The patient is otherwise without complaint today.   Past Medical History  Diagnosis Date  . Hypertension   . GERD (gastroesophageal reflux disease)   . Colon polyps 2011    tubular adenoma by excisional biopsy during colonoscopy  . ADD (attention deficit disorder)     Rx-Adderall  . Degenerative disc disease, cervical   . Palpitations 2003    Holter in 2003: PACs and PVCs; negative stress nuclear test in 2005; right bundle branch block; echo in 2008-mild LVH, otherwise normal; 2008-negative stress nuclear test.  . Prostatitis     prostate calcifications by CT  . Sleep apnea     questionable diagnosis  . Pneumonia   . Anginal pain   . Complete heart block   . Coronary artery disease    Past Surgical History  Procedure Laterality Date  . Anterior cervical decomp/discectomy fusion    . Knee surgery      rt  . Colonoscopy w/ polypectomy  2011    tubular adenoma  . Esophagogastroduodenoscopy      Gastritis  . Pacemaker insertion  12/17/12    MDT Adapta L implanted by Dr Rayann Heman for mobitz II second degree AV block    Current Outpatient Prescriptions  Medication Sig Dispense Refill  . aspirin 81 MG chewable tablet Chew 81 mg by mouth daily.      . clopidogrel (PLAVIX) 75 MG tablet Take 1 tablet (75 mg total) by mouth daily with breakfast.  30 tablet  11  . diltiazem (CARDIZEM CD) 240 MG 24 hr capsule Take 240 mg by mouth at bedtime.      . fluticasone (FLONASE) 50 MCG/ACT nasal spray Place 2 sprays into the nose as needed.      . hydrochlorothiazide (MICROZIDE) 12.5 MG  capsule Take 12.5 mg by mouth daily.      Marland Kitchen ibuprofen (ADVIL,MOTRIN) 200 MG tablet Take 400 mg by mouth every 8 (eight) hours as needed.      . isosorbide dinitrate (ISORDIL) 10 MG tablet Take 1 tablet (10 mg total) by mouth 3 (three) times daily.  30 tablet  0  . nitroGLYCERIN (NITROSTAT) 0.4 MG SL tablet Place 1 tablet (0.4 mg total) under the tongue every 5 (five) minutes as needed for chest pain.  90 tablet  3  . pantoprazole (PROTONIX) 40 MG tablet take 1 tablet by mouth once daily  30 tablet  5  . simvastatin (ZOCOR) 10 MG tablet Take 1 tablet (10 mg total) by mouth daily at 6 PM.  30 tablet  11  . tamsulosin (FLOMAX) 0.4 MG CAPS capsule take 1 capsule at bedtime       No current facility-administered medications for this visit.    Physical Exam: Filed Vitals:   05/19/13 1222  BP: 139/82  Pulse: 72  Height: 6' (1.829 m)  Weight: 220 lb (99.791 kg)    GEN- The patient is well appearing, alert and oriented x 3 today.   Head- normocephalic, atraumatic Eyes-  Sclera clear, conjunctiva pink Ears- hearing intact Oropharynx- clear Lungs- Clear to ausculation bilaterally, normal work of breathing Chest- pacemaker  pocket is well healed Heart- Regular rate and rhythm, no murmurs, rubs or gallops, PMI not laterally displaced GI- soft, NT, ND, + BS Extremities- no clubbing, cyanosis, or edema  Pacemaker interrogation- reviewed in detail today,  See PACEART report  Assessment and Plan:  1. Second degree AV block Normal pacemaker function See Pace Art report No changes today  2. HTN Given his concerns, I will stop hctz today   3. CAD Stable No change required today  Return to see me in 2 weeks

## 2013-06-02 ENCOUNTER — Encounter: Payer: Self-pay | Admitting: Internal Medicine

## 2013-06-04 ENCOUNTER — Encounter: Payer: Self-pay | Admitting: *Deleted

## 2013-06-04 ENCOUNTER — Encounter: Payer: Self-pay | Admitting: Internal Medicine

## 2013-06-04 ENCOUNTER — Ambulatory Visit (INDEPENDENT_AMBULATORY_CARE_PROVIDER_SITE_OTHER): Payer: BC Managed Care – PPO | Admitting: Internal Medicine

## 2013-06-04 VITALS — BP 146/90 | HR 79 | Ht 72.0 in | Wt 234.2 lb

## 2013-06-04 DIAGNOSIS — I10 Essential (primary) hypertension: Secondary | ICD-10-CM

## 2013-06-04 DIAGNOSIS — R0609 Other forms of dyspnea: Secondary | ICD-10-CM

## 2013-06-04 DIAGNOSIS — R079 Chest pain, unspecified: Secondary | ICD-10-CM

## 2013-06-04 DIAGNOSIS — I441 Atrioventricular block, second degree: Secondary | ICD-10-CM

## 2013-06-04 DIAGNOSIS — R0600 Dyspnea, unspecified: Secondary | ICD-10-CM

## 2013-06-04 DIAGNOSIS — I2510 Atherosclerotic heart disease of native coronary artery without angina pectoris: Secondary | ICD-10-CM

## 2013-06-04 DIAGNOSIS — R0989 Other specified symptoms and signs involving the circulatory and respiratory systems: Secondary | ICD-10-CM

## 2013-06-04 LAB — CBC WITH DIFFERENTIAL/PLATELET
Basophils Absolute: 0 10*3/uL (ref 0.0–0.1)
Basophils Relative: 0.6 % (ref 0.0–3.0)
Eosinophils Absolute: 0.1 10*3/uL (ref 0.0–0.7)
Eosinophils Relative: 1.8 % (ref 0.0–5.0)
HCT: 47.4 % (ref 39.0–52.0)
Hemoglobin: 15.5 g/dL (ref 13.0–17.0)
Lymphocytes Relative: 28.7 % (ref 12.0–46.0)
Lymphs Abs: 2.1 10*3/uL (ref 0.7–4.0)
MCHC: 32.7 g/dL (ref 30.0–36.0)
MCV: 87.9 fl (ref 78.0–100.0)
Monocytes Absolute: 0.6 10*3/uL (ref 0.1–1.0)
Monocytes Relative: 8.7 % (ref 3.0–12.0)
Neutro Abs: 4.4 10*3/uL (ref 1.4–7.7)
Neutrophils Relative %: 60.2 % (ref 43.0–77.0)
Platelets: 251 10*3/uL (ref 150.0–400.0)
RBC: 5.39 Mil/uL (ref 4.22–5.81)
RDW: 14.1 % (ref 11.5–14.6)
WBC: 7.2 10*3/uL (ref 4.5–10.5)

## 2013-06-04 LAB — BASIC METABOLIC PANEL
BUN: 16 mg/dL (ref 6–23)
CO2: 29 mEq/L (ref 19–32)
Calcium: 9.5 mg/dL (ref 8.4–10.5)
Chloride: 102 mEq/L (ref 96–112)
Creatinine, Ser: 0.9 mg/dL (ref 0.4–1.5)
GFR: 87.4 mL/min (ref >60.00)
Glucose, Bld: 85 mg/dL (ref 70–99)
Potassium: 3.7 mEq/L (ref 3.5–5.1)
Sodium: 138 mEq/L (ref 135–145)

## 2013-06-04 LAB — PROTIME-INR
INR: 1 ratio (ref 0.8–1.0)
Prothrombin Time: 10.5 s (ref 10.2–12.4)

## 2013-06-04 NOTE — Patient Instructions (Signed)
Your physician has requested that you have a cardiac catheterization. Cardiac catheterization is used to diagnose and/or treat various heart conditions. Doctors may recommend this procedure for a number of different reasons. The most common reason is to evaluate chest pain. Chest pain can be a symptom of coronary artery disease (CAD), and cardiac catheterization can show whether plaque is narrowing or blocking your heart's arteries. This procedure is also used to evaluate the valves, as well as measure the blood flow and oxygen levels in different parts of your heart. For further information please visit [email or web address]. Please follow instruction sheet, as given.   See instruction sheet

## 2013-06-04 NOTE — Progress Notes (Signed)
PCP: LUKING,SCOTT, MD Primary Cardiologist:  Dr Koneswaran  [name] is a 58 y.o. male who presents today for routine electrophysiology followup.  Since last being seen in our clinic, the patient reports worsening chest discomfort.   His chest pain is described as "chest tightness" and similar to his prior MI.  His pain occurs primarily with ambulation and is associated with SOB.  Today, he denies symptoms of palpitations,   dizziness, presyncope, or syncope.  The patient is otherwise without complaint today.   Past Medical History  Diagnosis Date  . Hypertension   . GERD (gastroesophageal reflux disease)   . Colon polyps 2011    tubular adenoma by excisional biopsy during colonoscopy  . ADD (attention deficit disorder)     Rx-Adderall  . Degenerative disc disease, cervical   . Palpitations 2003    Holter in 2003: PACs and PVCs; negative stress nuclear test in 2005; right bundle branch block; echo in 2008-mild LVH, otherwise normal; 2008-negative stress nuclear test.  . Prostatitis     prostate calcifications by CT  . Sleep apnea     questionable diagnosis  . Pneumonia   . Anginal pain   . Complete heart block   . Coronary artery disease    Past Surgical History  Procedure Laterality Date  . Anterior cervical decomp/discectomy fusion    . Knee surgery      rt  . Colonoscopy w/ polypectomy  2011    tubular adenoma  . Esophagogastroduodenoscopy      Gastritis  . Pacemaker insertion  12/17/12    MDT Adapta L implanted by Dr Zell Doucette for mobitz II second degree AV block    Current Outpatient Prescriptions  Medication Sig Dispense Refill  . aspirin 81 MG chewable tablet Chew 81 mg by mouth daily.      . clopidogrel (PLAVIX) 75 MG tablet Take 1 tablet (75 mg total) by mouth daily with breakfast.  30 tablet  11  . diltiazem (CARDIZEM CD) 240 MG 24 hr capsule Take 240 mg by mouth at bedtime.      . fluticasone (FLONASE) 50 MCG/ACT nasal spray Place 2 sprays into the nose as  needed.      . ibuprofen (ADVIL,MOTRIN) 200 MG tablet Take 400 mg by mouth every 8 (eight) hours as needed.      . isosorbide dinitrate (ISORDIL) 10 MG tablet Take 1 tablet (10 mg total) by mouth 3 (three) times daily.  30 tablet  0  . nitroGLYCERIN (NITROSTAT) 0.4 MG SL tablet Place 1 tablet (0.4 mg total) under the tongue every 5 (five) minutes as needed for chest pain.  90 tablet  3  . pantoprazole (PROTONIX) 40 MG tablet take 1 tablet by mouth once daily  30 tablet  5  . simvastatin (ZOCOR) 10 MG tablet Take 1 tablet (10 mg total) by mouth daily at 6 PM.  30 tablet  11  . tamsulosin (FLOMAX) 0.4 MG CAPS capsule take 1 capsule at bedtime       No current facility-administered medications for this visit.    Physical Exam: Filed Vitals:   06/04/13 1211  BP: 146/90  Pulse: 79  Height: 6' (1.829 m)  Weight: 234 lb 3.2 oz (106.232 kg)    GEN- The patient is well appearing, alert and oriented x 3 today.   Head- normocephalic, atraumatic Eyes-  Sclera clear, conjunctiva pink Ears- hearing intact Oropharynx- clear Lungs- Clear to ausculation bilaterally, normal work of breathing Chest- pacemaker pocket is well   healed Heart- Regular rate and rhythm, no murmurs, rubs or gallops, PMI not laterally displaced GI- soft, NT, ND, + BS Extremities- no clubbing, cyanosis, or edema  Pacemaker interrogation- reviewed in detail today,  See PACEART report  Assessment and Plan:  1. Second degree AV block Normal pacemaker function See Pace Art report No changes today  2. HTN Given his concerns, I will stop hctz today   3. CAD Given his prior stenting and progressive symptoms of SOB and chest tightness, I think that our next step should be repeat cath.  His features are high risk and I do not feel that a negative stress test would be reassuring. Risks, benefits, and alternatives to [facility] were discussed with the patient today who wishes to proceed.  We will therefore schedule cath at the next  available time.  Return to see me in 2-3 weeks

## 2013-06-09 ENCOUNTER — Encounter (HOSPITAL_COMMUNITY): Payer: Self-pay | Admitting: Pharmacy Technician

## 2013-06-11 ENCOUNTER — Ambulatory Visit (HOSPITAL_COMMUNITY)
Admission: RE | Admit: 2013-06-11 | Discharge: 2013-06-11 | Disposition: A | Discharge status: Home / Self Care | Payer: BC Managed Care – PPO | Source: Ambulatory Visit | Attending: Cardiovascular Disease | Admitting: Cardiovascular Disease

## 2013-06-11 ENCOUNTER — Encounter (HOSPITAL_COMMUNITY)
Admission: RE | Disposition: A | Discharge status: Home / Self Care | Payer: Self-pay | Source: Ambulatory Visit | Attending: Cardiovascular Disease

## 2013-06-11 DIAGNOSIS — K219 Gastro-esophageal reflux disease without esophagitis: Secondary | ICD-10-CM | POA: Insufficient documentation

## 2013-06-11 DIAGNOSIS — Z7982 Long term (current) use of aspirin: Secondary | ICD-10-CM | POA: Insufficient documentation

## 2013-06-11 DIAGNOSIS — Z950 Presence of cardiac pacemaker: Secondary | ICD-10-CM | POA: Insufficient documentation

## 2013-06-11 DIAGNOSIS — I2510 Atherosclerotic heart disease of native coronary artery without angina pectoris: Secondary | ICD-10-CM | POA: Insufficient documentation

## 2013-06-11 DIAGNOSIS — F988 Other specified behavioral and emotional disorders with onset usually occurring in childhood and adolescence: Secondary | ICD-10-CM | POA: Insufficient documentation

## 2013-06-11 DIAGNOSIS — Z7902 Long term (current) use of antithrombotics/antiplatelets: Secondary | ICD-10-CM | POA: Insufficient documentation

## 2013-06-11 DIAGNOSIS — IMO0002 Reserved for concepts with insufficient information to code with codable children: Secondary | ICD-10-CM | POA: Insufficient documentation

## 2013-06-11 DIAGNOSIS — R079 Chest pain, unspecified: Secondary | ICD-10-CM | POA: Insufficient documentation

## 2013-06-11 DIAGNOSIS — I10 Essential (primary) hypertension: Secondary | ICD-10-CM | POA: Insufficient documentation

## 2013-06-11 HISTORY — PX: LEFT HEART CATHETERIZATION WITH CORONARY ANGIOGRAM: SHX5451

## 2013-06-11 SURGERY — LEFT HEART CATHETERIZATION WITH CORONARY ANGIOGRAM
Anesthesia: LOCAL

## 2013-06-11 MED ORDER — FENTANYL CITRATE 0.05 MG/ML IJ SOLN
INTRAMUSCULAR | Status: AC
  Filled 2013-06-11: qty 2

## 2013-06-11 MED ORDER — NITROGLYCERIN 0.4 MG SL SUBL
SUBLINGUAL_TABLET | SUBLINGUAL | Status: AC
  Filled 2013-06-11: qty 25

## 2013-06-11 MED ORDER — HEPARIN (PORCINE) IN NACL 2-0.9 UNIT/ML-% IJ SOLN
INTRAMUSCULAR | Status: AC
Start: 2013-06-11 — End: 2013-06-11
  Filled 2013-06-11: qty 1000

## 2013-06-11 MED ORDER — SODIUM CHLORIDE 0.9 % IV SOLN: 250.0000 mL | INTRAVENOUS | Status: DC | PRN

## 2013-06-11 MED ORDER — NITROGLYCERIN 0.2 MG/ML ON CALL CATH LAB
INTRAVENOUS | Status: AC
  Filled 2013-06-11: qty 1

## 2013-06-11 MED ORDER — LIDOCAINE HCL (PF) 1 % IJ SOLN
INTRAMUSCULAR | Status: AC
  Filled 2013-06-11: qty 30

## 2013-06-11 MED ORDER — MIDAZOLAM HCL 2 MG/2ML IJ SOLN
INTRAMUSCULAR | Status: AC
  Filled 2013-06-11: qty 2

## 2013-06-11 MED ORDER — SODIUM CHLORIDE 0.9 % IV SOLN
INTRAVENOUS | Status: DC
  Administered 2013-06-11: 10:00:00 via INTRAVENOUS

## 2013-06-11 MED ORDER — ASPIRIN 81 MG PO CHEW: 81.0000 mg | CHEWABLE_TABLET | ORAL | Status: DC

## 2013-06-11 MED ORDER — SODIUM CHLORIDE 0.9 % IJ SOLN
3.0000 mL | INTRAMUSCULAR | Status: DC | PRN
Start: 2013-06-11 — End: 2013-06-11

## 2013-06-11 MED ORDER — SODIUM CHLORIDE 0.9 % IJ SOLN: 3.0000 mL | Freq: Two times a day (BID) | INTRAMUSCULAR | Status: DC

## 2013-06-11 MED ORDER — SODIUM CHLORIDE 0.9 % IV SOLN: INTRAVENOUS | Status: DC

## 2013-06-11 MED ORDER — NITROGLYCERIN 0.4 MG SL SUBL
0.4000 mg | SUBLINGUAL_TABLET | SUBLINGUAL | Status: DC | PRN
  Administered 2013-06-11: 0.4 mg via SUBLINGUAL
  Filled 2013-06-11: qty 25

## 2013-06-11 NOTE — H&P (View-Only) (Signed)
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 male who presents today for routine electrophysiology followup.  Since last being seen in our clinic, the patient reports worsening chest discomfort.   His chest pain is described as "chest tightness" and similar to his prior MI.  His pain occurs primarily with ambulation and is associated with SOB.  Today, he denies symptoms of palpitations,   dizziness, presyncope, or syncope.  The patient is otherwise without complaint today.   Past Medical History  Diagnosis Date  . Hypertension   . GERD (gastroesophageal reflux disease)   . Colon polyps 2011    tubular adenoma by excisional biopsy during colonoscopy  . ADD (attention deficit disorder)     Rx-Adderall  . Degenerative disc disease, cervical   . Palpitations 2003    Holter in 2003: PACs and PVCs; negative stress nuclear test in 2005; right bundle branch block; echo in 2008-mild LVH, otherwise normal; 2008-negative stress nuclear test.  . Prostatitis     prostate calcifications by CT  . Sleep apnea     questionable diagnosis  . Pneumonia   . Anginal pain   . Complete heart block   . Coronary artery disease    Past Surgical History  Procedure Laterality Date  . Anterior cervical decomp/discectomy fusion    . Knee surgery      rt  . Colonoscopy w/ polypectomy  2011    tubular adenoma  . Esophagogastroduodenoscopy      Gastritis  . Pacemaker insertion  12/17/12    MDT Adapta L implanted by Dr Rayann Heman for mobitz II second degree AV block    Current Outpatient Prescriptions  Medication Sig Dispense Refill  . aspirin 81 MG chewable tablet Chew 81 mg by mouth daily.      . clopidogrel (PLAVIX) 75 MG tablet Take 1 tablet (75 mg total) by mouth daily with breakfast.  30 tablet  11  . diltiazem (CARDIZEM CD) 240 MG 24 hr capsule Take 240 mg by mouth at bedtime.      . fluticasone (FLONASE) 50 MCG/ACT nasal spray Place 2 sprays into the nose as  needed.      Marland Kitchen ibuprofen (ADVIL,MOTRIN) 200 MG tablet Take 400 mg by mouth every 8 (eight) hours as needed.      . isosorbide dinitrate (ISORDIL) 10 MG tablet Take 1 tablet (10 mg total) by mouth 3 (three) times daily.  30 tablet  0  . nitroGLYCERIN (NITROSTAT) 0.4 MG SL tablet Place 1 tablet (0.4 mg total) under the tongue every 5 (five) minutes as needed for chest pain.  90 tablet  3  . pantoprazole (PROTONIX) 40 MG tablet take 1 tablet by mouth once daily  30 tablet  5  . simvastatin (ZOCOR) 10 MG tablet Take 1 tablet (10 mg total) by mouth daily at 6 PM.  30 tablet  11  . tamsulosin (FLOMAX) 0.4 MG CAPS capsule take 1 capsule at bedtime       No current facility-administered medications for this visit.    Physical Exam: Filed Vitals:   06/04/13 1211  BP: 146/90  Pulse: 79  Height: 6' (1.829 m)  Weight: 234 lb 3.2 oz (106.232 kg)    GEN- The patient is well appearing, alert and oriented x 3 today.   Head- normocephalic, atraumatic Eyes-  Sclera clear, conjunctiva pink Ears- hearing intact Oropharynx- clear Lungs- Clear to ausculation bilaterally, normal work of breathing Chest- pacemaker pocket is well  healed Heart- Regular rate and rhythm, no murmurs, rubs or gallops, PMI not laterally displaced GI- soft, NT, ND, + BS Extremities- no clubbing, cyanosis, or edema  Pacemaker interrogation- reviewed in detail today,  See PACEART report  Assessment and Plan:  1. Second degree AV block Normal pacemaker function See Pace Art report No changes today  2. HTN Given his concerns, I will stop hctz today   3. CAD Given his prior stenting and progressive symptoms of SOB and chest tightness, I think that our next step should be repeat cath.  His features are high risk and I do not feel that a negative stress test would be reassuring. Risks, benefits, and alternatives to [facility] were discussed with the patient today who wishes to proceed.  We will therefore schedule cath at the next  available time.  Return to see me in 2-3 weeks

## 2013-06-11 NOTE — CV Procedure (Signed)
    Cardiac Catheterization Procedure Note  Name: [name] MRN: [id number] DOB: 08/06/1955  Procedure: Left Heart Cath, Selective Coronary Angiography, LV angiography  Indication: Chest pain with known history of coronary artery disease and previous stenting of the right coronary artery.   Medications:  Sedation:  2 mg IV Versed, 25 mcg IV Fentanyl  Contrast:  100 mL Omnipaque  Procedural details: The right groin was prepped, draped, and anesthetized with 1% lidocaine. Using modified Seldinger technique, a 5 French sheath was introduced into the right femoral artery. Standard Judkins catheters were used for coronary angiography and left ventriculography. An AL-1 catheter was used to engage the right coronary artery. Catheter exchanges were performed over a guidewire. There were no immediate procedural complications. The patient was transferred to the post catheterization recovery area for further monitoring.   Procedural Findings:  Hemodynamics: AO:  154/79   mmHg LV:  154/12    mmHg LVEDP: 19  mmHg  Coronary angiography: Coronary dominance: Right   Left Main:  Normal  Left Anterior Descending (LAD):  Normal in size. There is 40 % stenosis in the midsegment. There is 30-40% stenosis distally.  1st diagonal (D1):  Large in size with 20% ostial stenosis.  2nd diagonal (D2):  Small in size with minor irregularities.  3rd diagonal (D3):  Small in size with minor irregularities.  Circumflex (LCx):  Normal in size with minor irregularities.  1st obtuse marginal:  Small in size with minor irregularities.  2nd obtuse marginal:  Large in size with 40% stenosis very distally.  3rd obtuse marginal:  Normal in size with minor irregularities.   AV groove continuation segment: Normal in size with no significant disease. This gives on posterolateral branch which is free of significant disease.  Right Coronary Artery: The vessel is normal in size and dominant. It has a high  anterior takeoff and was engaged with an AL-1 catheter. The vessel has minor irregularities. A stent is noted in the midsegment with no significant restenosis.  Posterior descending artery: Large in size with no significant disease.  Posterior AV segment: Medium in size with minor irregularities.  Left ventriculography: Left ventricular systolic function is normal , LVEF is estimated at 55 %, there is no significant mitral regurgitation   Final Conclusions:   1. Significant 1 vessel coronary artery disease with patent RCA stent and overall mild to moderate disease in the LAD with no flow-limiting lesions. 2. Mildly elevated left ventricular end-diastolic pressure with moderate hypertension. 3. Normal LV systolic function.  Recommendations:  Continue medical therapy. His symptoms do not seem to be due to obstructive coronary artery disease. His blood pressure is elevated with elevated left ventricular end-diastolic pressure. He probably has some degree of diastolic dysfunction. Recommend blood pressure control and testing for sleep apnea if there is clinical indication. Pacemaker management per Dr. Rayann Heman.  Kathlyn Sacramento MD, [facility] 06/11/2013, 12:00 PM

## 2013-06-11 NOTE — Progress Notes (Signed)
Site area: Right groin  Site Prior to Removal:  Level 0  Pressure Applied For 30  MINUTES    Minutes Beginning at 1220 pm  Manual: Yes  Patient Status During Pull: Stable,   Post Pull Groin Site:  Level 0  Post Pull Instructions Given: yes  Post Pull Pulses Present: Yes Dressing applied: Yes Comments: Hemostatis achieved.  Pt denies any discomfort.  Post instruction given.  Pt verbalize understanding.

## 2013-06-11 NOTE — Progress Notes (Signed)
Discharge instruction given per MD order.  Pt and Cg able to verbalize understanding  Pt to car via wheelchair. 

## 2013-06-11 NOTE — Discharge Instructions (Signed)
° °  ° °  ° ° ° °.  Angiography, Care After Refer to this sheet in the next few weeks. These instructions provide you with information on caring for yourself after your procedure. Your health care provider may also give you more specific instructions. Your treatment has been planned according to current medical practices, but problems sometimes occur. Call your health care provider if you have any problems or questions after your procedure.  WHAT TO EXPECT AFTER THE PROCEDURE After your procedure, it is typical to have the following sensations:  Minor discomfort or tenderness and a small bump at the catheter insertion site. The bump should usually decrease in size and tenderness within 1 to 2 weeks.  Any bruising will usually fade within 2 to 4 weeks. HOME CARE INSTRUCTIONS   You may need to keep taking blood thinners if they were prescribed for you. Only take over-the-counter or prescription medicines for pain, fever, or discomfort as directed by your health care provider.  Do not apply powder or lotion to the site.  Do not sit in a bathtub, swimming pool, or whirlpool for 5 to 7 days.  You may shower 24 hours after the procedure. Remove the bandage (dressing) and gently wash the site with plain soap and water. Gently pat the site dry.  Inspect the site at least twice daily.  Limit your activity for the first 48 hours. Do not bend, squat, or lift anything over 20 lb (9 kg) or as directed by your health care provider.  Do not drive home if you are discharged the day of the procedure. Have someone else drive you. Follow instructions about when you can drive or return to work. SEEK MEDICAL CARE IF:  You get lightheaded when standing up.  You have drainage (other than a small amount of blood on the dressing).  You have chills.  You have a fever.  You have redness, warmth, swelling, or pain at the insertion site. SEEK IMMEDIATE MEDICAL CARE IF:   You develop chest pain or shortness of  breath, feel faint, or pass out.  You have bleeding, swelling larger than a walnut, or drainage from the catheter insertion site.  You develop pain, discoloration, coldness, or severe bruising in the leg or arm that held the catheter.  You develop bleeding from any other place, such as the bowels. You may see bright red blood in your urine or stools, or your stools may appear black and tarry.  You have heavy bleeding from the site. If this happens, hold pressure on the site. MAKE SURE YOU:  Understand these instructions.  Will watch your condition.  Will get help right away if you are not doing well or get worse.  If bleeding is not under control call 911    Document Released: 10/20/2004 Document Revised: 12/04/2012 Document Reviewed: 08/26/2012 [facility] Patient Information 2014 [place], [place].

## 2013-06-11 NOTE — Progress Notes (Signed)
Received pt from cath procedure. With sheath in right groin.  Pt alert and denies any discomfort at this time.

## 2013-06-11 NOTE — Interval H&P Note (Signed)
Cath Lab Visit (complete for each Cath Lab visit)  Clinical Evaluation Leading to the Procedure:   ACS: no  Non-ACS:    Anginal Classification: CCS III  Anti-ischemic medical therapy: Maximal Therapy (2 or more classes of medications)  Non-Invasive Test Results: No non-invasive testing performed  Prior CABG: No previous CABG      History and Physical Interval Note:  06/11/2013 11:25 AM  [name]  has presented today for surgery, with the diagnosis of chest discomfort  The various methods of treatment have been discussed with the patient and family. After consideration of risks, benefits and other options for treatment, the patient has consented to  Procedure(s): LEFT HEART CATHETERIZATION WITH CORONARY ANGIOGRAM (N/A) as a surgical intervention .  The patient's history has been reviewed, patient examined, no change in status, stable for surgery.  I have reviewed the patient's chart and labs.  Questions were answered to the patient's satisfaction.     Kathlyn Sacramento

## 2013-06-16 ENCOUNTER — Ambulatory Visit: Payer: BC Managed Care – PPO | Admitting: Cardiovascular Disease

## 2013-06-23 ENCOUNTER — Ambulatory Visit (INDEPENDENT_AMBULATORY_CARE_PROVIDER_SITE_OTHER): Payer: BC Managed Care – PPO | Admitting: Internal Medicine

## 2013-06-23 ENCOUNTER — Encounter: Payer: Self-pay | Admitting: Internal Medicine

## 2013-06-23 VITALS — BP 140/78 | HR 87 | Ht 72.0 in

## 2013-06-23 DIAGNOSIS — R079 Chest pain, unspecified: Secondary | ICD-10-CM

## 2013-06-23 DIAGNOSIS — R0600 Dyspnea, unspecified: Secondary | ICD-10-CM

## 2013-06-23 DIAGNOSIS — R0609 Other forms of dyspnea: Secondary | ICD-10-CM

## 2013-06-23 DIAGNOSIS — I2510 Atherosclerotic heart disease of native coronary artery without angina pectoris: Secondary | ICD-10-CM

## 2013-06-23 DIAGNOSIS — I441 Atrioventricular block, second degree: Secondary | ICD-10-CM

## 2013-06-23 DIAGNOSIS — R0989 Other specified symptoms and signs involving the circulatory and respiratory systems: Secondary | ICD-10-CM

## 2013-06-23 LAB — MDC_IDC_ENUM_SESS_TYPE_INCLINIC
Battery Impedance: 100 Ohm
Battery Remaining Longevity: 139 mo
Battery Voltage: 2.79 V
Brady Statistic AP VP Percent: 3 %
Brady Statistic AP VS Percent: 0 %
Brady Statistic AS VP Percent: 97 %
Brady Statistic AS VS Percent: 0 %
Date Time Interrogation Session: 20150309143533
Lead Channel Impedance Value: 499 Ohm
Lead Channel Impedance Value: 580 Ohm
Lead Channel Pacing Threshold Amplitude: 0.5 V
Lead Channel Pacing Threshold Amplitude: 0.5 V
Lead Channel Pacing Threshold Pulse Width: 0.4 ms
Lead Channel Pacing Threshold Pulse Width: 0.4 ms
Lead Channel Sensing Intrinsic Amplitude: 2.8 mV
Lead Channel Setting Pacing Amplitude: 2 V
Lead Channel Setting Pacing Amplitude: 2.5 V
Lead Channel Setting Pacing Pulse Width: 0.4 ms
Lead Channel Setting Sensing Sensitivity: 4 mV

## 2013-06-23 NOTE — Patient Instructions (Signed)
Your physician recommends that you schedule a follow-up appointment in: 3 months with Dr Rayann Heman  You have been referred to Dr Norville Haggard at [facility]

## 2013-06-25 NOTE — Progress Notes (Signed)
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 male who presents today for routine electrophysiology followup.  Since last being seen in our clinic, the patient reports ongoing chest discomfort.   His chest pain is described as "chest tightness" with exertion.  We have been able to reproduce this with V pacing previously.  He has done best with AV optimization and a very short AV timing.  I had him cathed 06/11/13 and progressive CAD was excluded as a cause.  He has also had a prior CT which did not reveal PTE.  His pain occurs primarily with ambulation and is associated with SOB.  This argues against microperforation as the cause.  Today, he denies symptoms of palpitations, dizziness, presyncope, or syncope.  The patient is otherwise without complaint today.   Past Medical History  Diagnosis Date  . Hypertension   . GERD (gastroesophageal reflux disease)   . Colon polyps 2011    tubular adenoma by excisional biopsy during colonoscopy  . ADD (attention deficit disorder)     Rx-Adderall  . Degenerative disc disease, cervical   . Palpitations 2003    Holter in 2003: PACs and PVCs; negative stress nuclear test in 2005; right bundle branch block; echo in 2008-mild LVH, otherwise normal; 2008-negative stress nuclear test.  . Prostatitis     prostate calcifications by CT  . Sleep apnea     questionable diagnosis  . Pneumonia   . Anginal pain   . Complete heart block   . Coronary artery disease    Past Surgical History  Procedure Laterality Date  . Anterior cervical decomp/discectomy fusion    . Knee surgery      rt  . Colonoscopy w/ polypectomy  2011    tubular adenoma  . Esophagogastroduodenoscopy      Gastritis  . Pacemaker insertion  12/17/12    MDT Adapta L implanted by Dr Rayann Heman for mobitz II second degree AV block    Current Outpatient Prescriptions  Medication Sig Dispense Refill  . aspirin 81 MG chewable tablet Chew 81 mg by mouth  daily.      . clopidogrel (PLAVIX) 75 MG tablet Take 75 mg by mouth daily with breakfast.      . diltiazem (CARDIZEM CD) 240 MG 24 hr capsule Take 240 mg by mouth at bedtime.      Marland Kitchen ibuprofen (ADVIL,MOTRIN) 200 MG tablet Take 400 mg by mouth every 8 (eight) hours as needed.      . nitroGLYCERIN (NITROSTAT) 0.4 MG SL tablet Place 0.4 mg under the tongue every 5 (five) minutes as needed for chest pain.      . pantoprazole (PROTONIX) 40 MG tablet Take 40 mg by mouth every morning.      . simvastatin (ZOCOR) 10 MG tablet Take 10 mg by mouth daily at 6 PM.      . tamsulosin (FLOMAX) 0.4 MG CAPS capsule Take 0.4 mg by mouth at bedtime.       No current facility-administered medications for this visit.    Physical Exam: Filed Vitals:   06/23/13 1355  BP: 140/78  Pulse: 87  Height: 6' (1.829 m)    GEN- The patient is well appearing, alert and oriented x 3 today.   Head- normocephalic, atraumatic Eyes-  Sclera clear, conjunctiva pink Ears- hearing intact Oropharynx- clear Lungs- Clear to ausculation bilaterally, normal work of breathing Chest- pacemaker pocket is well healed Heart- Regular rate and rhythm, no murmurs, rubs  or gallops, PMI not laterally displaced GI- soft, NT, ND, + BS Extremities- no clubbing, cyanosis, or edema  Pacemaker interrogation- reviewed in detail today,  See PACEART report  Assessment and Plan:  1. Second degree AV block Normal pacemaker function See Claudia Desanctis Art report Today he is in complete heart block.  He seems clearly symptomatic with V pacing.  At times he has complete heart block and at other times not.  I have not been able to find the correct AV timing to resolve his symptoms though he did very well for a while with his present settings after AV optimization with echo.  Though LV lead placement could potentially improve his symptoms, his EF is preserved.  I do not want to perform any procedures without further direction. Given our exhaustive efforts  with ongoing symptoms, I think that the most prudent step at this point is to obtain another opinion.   I will therefore refer the patient to Dr Norville Haggard at [facility] for his opinion/ assistance with management. The patient is in agreement with referral at this time. I will see him again in 3 months

## 2013-06-27 ENCOUNTER — Ambulatory Visit (INDEPENDENT_AMBULATORY_CARE_PROVIDER_SITE_OTHER): Payer: BC Managed Care – PPO | Admitting: Cardiovascular Disease

## 2013-06-27 VITALS — BP 146/83 | HR 84 | Ht 72.0 in | Wt 232.0 lb

## 2013-06-27 DIAGNOSIS — Z950 Presence of cardiac pacemaker: Secondary | ICD-10-CM

## 2013-06-27 DIAGNOSIS — I10 Essential (primary) hypertension: Secondary | ICD-10-CM

## 2013-06-27 DIAGNOSIS — I441 Atrioventricular block, second degree: Secondary | ICD-10-CM

## 2013-06-27 DIAGNOSIS — Z9861 Coronary angioplasty status: Secondary | ICD-10-CM

## 2013-06-27 DIAGNOSIS — R5383 Other fatigue: Secondary | ICD-10-CM

## 2013-06-27 DIAGNOSIS — Z955 Presence of coronary angioplasty implant and graft: Secondary | ICD-10-CM

## 2013-06-27 DIAGNOSIS — R5381 Other malaise: Secondary | ICD-10-CM

## 2013-06-27 DIAGNOSIS — R079 Chest pain, unspecified: Secondary | ICD-10-CM

## 2013-06-27 DIAGNOSIS — I2510 Atherosclerotic heart disease of native coronary artery without angina pectoris: Secondary | ICD-10-CM

## 2013-06-27 MED ORDER — LOSARTAN POTASSIUM 25 MG PO TABS: 25.0000 mg | ORAL_TABLET | Freq: Every day | ORAL | Status: DC

## 2013-06-27 NOTE — Patient Instructions (Signed)
Your physician wants you to follow-up in: 6 months You will receive a reminder letter in the mail two months in advance. If you don't receive a letter, please call our office to schedule the follow-up appointment.   Your physician has recommended you make the following change in your medication:   1. START Losartan 25 mg daily    Thank you for choosing [facility] !

## 2013-06-27 NOTE — Progress Notes (Signed)
Patient ID: [name], male   DOB: Nov 03, 1955, 58 y.o.   MRN: [id number]      SUBJECTIVE: Since the patient's last visit with me, he underwent cardiac catheterization which revealed a patent RCA stent and mild to moderate LAD disease, with an elevated LVEDP. He has since seen Dr. Rayann Heman and has been referred to physician at [facility] for his symptoms related to V-pacing. He is frustrated as he would like to exercise but experiences chest discomfort with any significant exertion. This has led to depressed feelings which has caused him to eat and put on weight.    Allergies  Allergen Reactions  . Morphine And Related Other (See Comments)    Rash itching and redness  . Latex Rash    Current Outpatient Prescriptions  Medication Sig Dispense Refill  . aspirin 81 MG chewable tablet Chew 81 mg by mouth daily.      . clopidogrel (PLAVIX) 75 MG tablet Take 75 mg by mouth daily with breakfast.      . diltiazem (CARDIZEM CD) 240 MG 24 hr capsule Take 240 mg by mouth at bedtime.      Marland Kitchen ibuprofen (ADVIL,MOTRIN) 200 MG tablet Take 400 mg by mouth every 8 (eight) hours as needed.      . nitroGLYCERIN (NITROSTAT) 0.4 MG SL tablet Place 0.4 mg under the tongue every 5 (five) minutes as needed for chest pain.      . pantoprazole (PROTONIX) 40 MG tablet Take 40 mg by mouth every morning.      . simvastatin (ZOCOR) 10 MG tablet Take 10 mg by mouth daily at 6 PM.      . tamsulosin (FLOMAX) 0.4 MG CAPS capsule Take 0.4 mg by mouth at bedtime.       No current facility-administered medications for this visit.    Past Medical History  Diagnosis Date  . Hypertension   . GERD (gastroesophageal reflux disease)   . Colon polyps 2011    tubular adenoma by excisional biopsy during colonoscopy  . ADD (attention deficit disorder)     Rx-Adderall  . Degenerative disc disease, cervical   . Palpitations 2003    Holter in 2003: PACs and PVCs; negative stress nuclear test in 2005; right bundle branch  block; echo in 2008-mild LVH, otherwise normal; 2008-negative stress nuclear test.  . Prostatitis     prostate calcifications by CT  . Sleep apnea     questionable diagnosis  . Pneumonia   . Anginal pain   . Complete heart block   . Coronary artery disease     Past Surgical History  Procedure Laterality Date  . Anterior cervical decomp/discectomy fusion    . Knee surgery      rt  . Colonoscopy w/ polypectomy  2011    tubular adenoma  . Esophagogastroduodenoscopy      Gastritis  . Pacemaker insertion  12/17/12    MDT Adapta L implanted by Dr Rayann Heman for mobitz II second degree AV block    History   Social History  . Marital Status: Married    Spouse Name: N/A    Number of Children: 3  . Years of Education: N/A   Occupational History  . Not on file.   Social History Main Topics  . Smoking status: Former Smoker    Types: Cigarettes    Quit date: 12/20/2003  . Smokeless tobacco: Never Used  . Alcohol Use: 0.6 oz/week    1 Shots of liquor per week  Comment: occasional  . Drug Use: No     Comment: 30 + years ago - "crank, marjiuanna, ect."  . Sexual Activity: Yes    Birth Control/ Protection: Surgical   Other Topics Concern  . Not on file   Social History Narrative  . No narrative on file     Filed Vitals:   06/27/13 0824  BP: 146/83  Pulse: 84  Height: 6' (1.829 m)  Weight: 232 lb (105.235 kg)    PHYSICAL EXAM General: NAD Neck: No JVD, no thyromegaly. Lungs: Clear to auscultation bilaterally with normal respiratory effort. CV: Nondisplaced PMI.  Regular rate and rhythm, normal S1/S2, no S3/S4, soft I/VI pansystolic murmur along left sternal border. No pretibial or periankle edema.  No carotid bruit.  Normal pedal pulses.  Abdomen: Soft, nontender, no hepatosplenomegaly, no distention.  Neurologic: Alert and oriented x 3.  Psych: Normal affect. Extremities: No clubbing or cyanosis.   ECG: reviewed and available in electronic  records.      ASSESSMENT AND PLAN: 1. CAD s/p PCI of RCA: symptomatically stable. Continue ASA, Plavix, and simvastatin.  2. HTN: uncontrolled today on diltiazem. Did not tolerate amlodipine in the past. Will start losartan 25 mg daily given elevated BP and LVEDP. 3. Mobitz II AV block (symptomatic) s/p pacemaker: has continued to have symptoms in spite of shortening of the AV delay and echo optimization. He has been referred to [facility] by Dr. Rayann Heman.   Dispo: f/u 6 months.    Kate Sable, M.D., F.A.C.C.

## 2013-08-25 ENCOUNTER — Encounter: Payer: Self-pay | Admitting: Family Medicine

## 2013-08-25 ENCOUNTER — Ambulatory Visit (INDEPENDENT_AMBULATORY_CARE_PROVIDER_SITE_OTHER): Payer: BC Managed Care – PPO | Admitting: Family Medicine

## 2013-08-25 VITALS — BP 142/90 | Ht 72.0 in | Wt 236.2 lb

## 2013-08-25 DIAGNOSIS — M546 Pain in thoracic spine: Secondary | ICD-10-CM | POA: Insufficient documentation

## 2013-08-25 DIAGNOSIS — F988 Other specified behavioral and emotional disorders with onset usually occurring in childhood and adolescence: Secondary | ICD-10-CM

## 2013-08-25 DIAGNOSIS — I10 Essential (primary) hypertension: Secondary | ICD-10-CM

## 2013-08-25 DIAGNOSIS — N400 Enlarged prostate without lower urinary tract symptoms: Secondary | ICD-10-CM | POA: Insufficient documentation

## 2013-08-25 DIAGNOSIS — R079 Chest pain, unspecified: Secondary | ICD-10-CM | POA: Insufficient documentation

## 2013-08-25 DIAGNOSIS — G4733 Obstructive sleep apnea (adult) (pediatric): Secondary | ICD-10-CM

## 2013-08-25 DIAGNOSIS — I441 Atrioventricular block, second degree: Secondary | ICD-10-CM

## 2013-08-25 MED ORDER — TAMSULOSIN HCL 0.4 MG PO CAPS: 0.8000 mg | ORAL_CAPSULE | Freq: Every day | ORAL | Status: DC

## 2013-08-25 MED ORDER — CYCLOBENZAPRINE HCL 10 MG PO TABS: 10.0000 mg | ORAL_TABLET | Freq: Every day | ORAL | Status: DC

## 2013-08-25 NOTE — Progress Notes (Signed)
   Subjective:    Patient ID: [name], male    DOB: Sep 11, 1955, 58 y.o.   MRN: [id number]  Hypertension This is a chronic problem. The current episode started more than 1 year ago. The problem has been gradually improving since onset. The problem is controlled. Associated symptoms include shortness of breath. Pertinent negatives include no chest pain. There are no associated agents to hypertension. There are no known risk factors for coronary artery disease. Treatments tried: losartan. The current treatment provides significant improvement. There are no compliance problems.     Patient states he has some concerns but would rather just talk to Dr. Nicki Reaper about them personally.     Review of Systems  Constitutional: Negative for activity change, appetite change and fatigue.  HENT: Negative for congestion.   Respiratory: Positive for shortness of breath. Negative for cough, choking and chest tightness.   Cardiovascular: Negative for chest pain and leg swelling.  Endocrine: Negative for polydipsia and polyphagia.  Neurological: Negative for weakness.  Psychiatric/Behavioral: Negative for confusion.       Objective:   Physical Exam  Vitals reviewed. Constitutional: He appears well-nourished. No distress.  Cardiovascular: Normal rate and normal heart sounds.  Exam reveals no gallop.   No murmur heard. Pulmonary/Chest: Effort normal and breath sounds normal. No respiratory distress.  Musculoskeletal: He exhibits no edema.  Lymphadenopathy:    He has no cervical adenopathy.  Neurological: He is alert.  Skin: Skin is warm and dry.  Psychiatric: His behavior is normal.          Assessment & Plan:  1. Hypertension Patient with mild hypertension blood pressure actually fairly good for him currently 148/78 dietary measures mild physical activity  2. Obstructive sleep apnea Referral to sleep specialist is wise patient unable to tolerate current sleep machine this would benefit  his heart issue as well his blood pressure and fatigue if he can start using his CPAP machine  3. BPH (benign prostatic hyperplasia) We need to increase Flomax use 2 each evening hopefully this will help it causes any dizziness then it will need to be stopped or reduced down to the previous steps - PSA  4. ADD (attention deficit disorder) Patient was advised not to be on any Adderall currently because of his heart issues  5. Mobitz type II atrioventricular block This patient has a complex pacer issue. I believe he would benefit from seeing a specialist at [facility]. We will discuss with his cardiologist to see if they have her recommendation. The consultation with [facility] did not go well patient would prefer [facility]

## 2013-08-26 ENCOUNTER — Telehealth: Payer: Self-pay | Admitting: Internal Medicine

## 2013-08-26 NOTE — Telephone Encounter (Signed)
New message      Want to know if Dr Rayann Heman has a preference of a cardiologist at [facility] for this patient

## 2013-08-26 NOTE — Telephone Encounter (Signed)
Spoke with Autumn and let her know that Dr Rayann Heman is out until next week and will address upon his return

## 2013-09-09 NOTE — Telephone Encounter (Signed)
I have spoken with the patient.  He will keep his appointment with me as scheduled 10/08/13

## 2013-09-17 ENCOUNTER — Telehealth: Payer: Self-pay | Admitting: Internal Medicine

## 2013-09-17 NOTE — Telephone Encounter (Signed)
CAlled and left message on machine that Dr Rayann Heman says they talked last week and he was going to keep his follow up as scheduled with [facility] on 6/24 as he did not know who he would refer him to at [facility].

## 2013-09-17 NOTE — Telephone Encounter (Signed)
New message     Ask for any nurse.  Pt want to see a cardiologist at [facility].  Do we have someone we recommend?

## 2013-09-25 ENCOUNTER — Encounter: Payer: Self-pay | Admitting: Family Medicine

## 2013-09-29 ENCOUNTER — Ambulatory Visit (INDEPENDENT_AMBULATORY_CARE_PROVIDER_SITE_OTHER): Payer: BC Managed Care – PPO | Admitting: Family Medicine

## 2013-09-29 ENCOUNTER — Encounter: Payer: Self-pay | Admitting: Family Medicine

## 2013-09-29 VITALS — Temp 98.8°F | Ht 72.0 in | Wt 224.8 lb

## 2013-09-29 DIAGNOSIS — I499 Cardiac arrhythmia, unspecified: Secondary | ICD-10-CM

## 2013-09-29 DIAGNOSIS — J019 Acute sinusitis, unspecified: Secondary | ICD-10-CM

## 2013-09-29 MED ORDER — LEVOFLOXACIN 500 MG PO TABS: 500.0000 mg | ORAL_TABLET | Freq: Every day | ORAL | Status: AC

## 2013-09-29 NOTE — Progress Notes (Signed)
   Subjective:    Patient ID: [name], male    DOB: 05-Apr-1956, 58 y.o.   MRN: [id number]  Cough This is a new problem. The current episode started in the past 7 days. Associated symptoms include a fever, headaches, myalgias, nasal congestion, rhinorrhea and wheezing. Pertinent negatives include no chest pain or ear pain.      Review of Systems  Constitutional: Positive for fever. Negative for activity change.  HENT: Positive for congestion and rhinorrhea. Negative for ear pain.   Eyes: Negative for discharge.  Respiratory: Positive for cough and wheezing.   Cardiovascular: Negative for chest pain.  Musculoskeletal: Positive for myalgias.  Neurological: Positive for headaches.       Objective:   Physical Exam  Nursing note and vitals reviewed. Constitutional: He appears well-developed.  HENT:  Head: Normocephalic.  Mouth/Throat: Oropharynx is clear and moist. No oropharyngeal exudate.  Tender sinus  Neck: Normal range of motion.  Cardiovascular: Normal rate, regular rhythm and normal heart sounds.   No murmur heard. Pulmonary/Chest: Effort normal and breath sounds normal. He has no wheezes.  Lymphadenopathy:    He has no cervical adenopathy.  Neurological: He exhibits normal muscle tone.  Skin: Skin is warm and dry.          Assessment & Plan:  Sinus pressure pain/sinusitis/upper respiratory illness-antibiotics prescribed may use OTC measures for congestion should gradually get better warnings discussed.  Arrhythmia -- patient will be seeing cardiology in a couple weeks. He will discuss with them possibility of referral. He did not like [facility]. He is thinking about do or [place]. He states he will discuss with cardiologist. He will let us know if we need to help him.  Sleep apnea he has appointment with the specialist we will see in the near future

## 2013-10-08 ENCOUNTER — Ambulatory Visit (INDEPENDENT_AMBULATORY_CARE_PROVIDER_SITE_OTHER): Payer: BC Managed Care – PPO | Admitting: Internal Medicine

## 2013-10-08 ENCOUNTER — Encounter: Payer: Self-pay | Admitting: Internal Medicine

## 2013-10-08 VITALS — BP 140/86 | HR 77 | Ht 72.0 in | Wt 228.0 lb

## 2013-10-08 DIAGNOSIS — Z125 Encounter for screening for malignant neoplasm of prostate: Secondary | ICD-10-CM

## 2013-10-08 DIAGNOSIS — I10 Essential (primary) hypertension: Secondary | ICD-10-CM

## 2013-10-08 DIAGNOSIS — I441 Atrioventricular block, second degree: Secondary | ICD-10-CM

## 2013-10-08 LAB — HEPATIC FUNCTION PANEL
ALT: 28 U/L (ref 0–53)
AST: 21 U/L (ref 0–37)
Albumin: 4.4 g/dL (ref 3.5–5.2)
Alkaline Phosphatase: 64 U/L (ref 39–117)
Bilirubin, Direct: 0.1 mg/dL (ref 0.0–0.3)
Total Bilirubin: 0.6 mg/dL (ref 0.2–1.2)
Total Protein: 6.7 g/dL (ref 6.0–8.3)

## 2013-10-08 LAB — MDC_IDC_ENUM_SESS_TYPE_INCLINIC
Battery Impedance: 110 Ohm
Battery Remaining Longevity: 134 mo
Battery Voltage: 2.79 V
Brady Statistic AP VP Percent: 3 %
Brady Statistic AP VS Percent: 0 %
Brady Statistic AS VP Percent: 97 %
Brady Statistic AS VS Percent: 0 %
Date Time Interrogation Session: 20150624145417
Lead Channel Impedance Value: 433 Ohm
Lead Channel Impedance Value: 568 Ohm
Lead Channel Pacing Threshold Amplitude: 0.5 V
Lead Channel Pacing Threshold Amplitude: 0.5 V
Lead Channel Pacing Threshold Pulse Width: 0.4 ms
Lead Channel Pacing Threshold Pulse Width: 0.4 ms
Lead Channel Sensing Intrinsic Amplitude: 11.2 mV
Lead Channel Sensing Intrinsic Amplitude: 2.8 mV
Lead Channel Setting Pacing Amplitude: 2 V
Lead Channel Setting Pacing Amplitude: 2.5 V
Lead Channel Setting Pacing Pulse Width: 0.4 ms
Lead Channel Setting Sensing Sensitivity: 4 mV

## 2013-10-08 LAB — BASIC METABOLIC PANEL
BUN: 19 mg/dL (ref 6–23)
CO2: 30 mEq/L (ref 19–32)
Calcium: 9.2 mg/dL (ref 8.4–10.5)
Chloride: 106 mEq/L (ref 96–112)
Creatinine, Ser: 1.1 mg/dL (ref 0.4–1.5)
GFR: 74.38 mL/min (ref >60.00)
Glucose, Bld: 91 mg/dL (ref 70–99)
Potassium: 4.5 mEq/L (ref 3.5–5.1)
Sodium: 141 mEq/L (ref 135–145)

## 2013-10-08 LAB — CBC WITH DIFFERENTIAL/PLATELET
Basophils Absolute: 0 10*3/uL (ref 0.0–0.1)
Basophils Relative: 0.6 % (ref 0.0–3.0)
Eosinophils Absolute: 0.1 10*3/uL (ref 0.0–0.7)
Eosinophils Relative: 1.5 % (ref 0.0–5.0)
HCT: 44.8 % (ref 39.0–52.0)
Hemoglobin: 15.1 g/dL (ref 13.0–17.0)
Lymphocytes Relative: 27.1 % (ref 12.0–46.0)
Lymphs Abs: 2.1 10*3/uL (ref 0.7–4.0)
MCHC: 33.7 g/dL (ref 30.0–36.0)
MCV: 86.5 fl (ref 78.0–100.0)
Monocytes Absolute: 0.5 10*3/uL (ref 0.1–1.0)
Monocytes Relative: 6.9 % (ref 3.0–12.0)
Neutro Abs: 4.8 10*3/uL (ref 1.4–7.7)
Neutrophils Relative %: 63.9 % (ref 43.0–77.0)
Platelets: 277 10*3/uL (ref 150.0–400.0)
RBC: 5.18 Mil/uL (ref 4.22–5.81)
RDW: 13.8 % (ref 11.5–15.5)
WBC: 7.6 10*3/uL (ref 4.0–10.5)

## 2013-10-08 LAB — TSH: TSH: 0.99 u[IU]/mL (ref 0.35–4.50)

## 2013-10-08 LAB — PSA: PSA: 1.96 ng/mL (ref 0.10–4.00)

## 2013-10-08 LAB — T4, FREE: Free T4: 0.94 ng/dL (ref 0.60–1.60)

## 2013-10-08 NOTE — Patient Instructions (Addendum)
Your physician recommends that you schedule a follow-up appointment in: 2 months with Dr Rayann Heman   Your physician has recommended you make the following change in your medication:  1) Stop Diltiazem  Your physician recommends that you return for lab work today

## 2013-10-11 NOTE — Progress Notes (Signed)
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  Since last being seen in our clinic, the patient reports ongoing chest discomfort and worsening exercise intolerance.   His chest pain is described as "chest tightness" with exertion.  We have been able to reproduce this with V pacing previously.  He has done best with AV optimization and a very short AV timing.  I had him cathed 06/11/13 and progressive CAD was excluded as a cause.  He has also had a prior CT which did not reveal PTE.  His pain occurs primarily with ambulation and is associated with SOB.  This argues against microperforation as the cause.  Though he previously seemed better with cardizem, this doesn't seem to be helping at this time.  He went to see Dr [name] at [facility] but was frustrated by registrars in the office and did not return as recommended. Today, he denies symptoms of palpitations, dizziness, presyncope, or syncope.  The patient is otherwise without complaint today.   Past Medical History  Diagnosis Date  . Hypertension   . GERD (gastroesophageal reflux disease)   . Colon polyps 2011    tubular adenoma by excisional biopsy during colonoscopy  . ADD (attention deficit disorder)     Rx-Adderall  . Degenerative disc disease, cervical   . Palpitations 2003    Holter in 2003: PACs and PVCs; negative stress nuclear test in 2005; right bundle branch block; echo in 2008-mild LVH, otherwise normal; 2008-negative stress nuclear test.  . Prostatitis     prostate calcifications by CT  . Sleep apnea     questionable diagnosis  . Pneumonia   . Anginal pain   . Complete heart block   . Coronary artery disease    Past Surgical History  Procedure Laterality Date  . Anterior cervical decomp/discectomy fusion    . Knee surgery      rt  . Colonoscopy w/ polypectomy  2011    tubular adenoma  . Esophagogastroduodenoscopy     Gastritis  . Pacemaker insertion  12/17/12    MDT Adapta L implanted by Dr Rayann Heman for mobitz II second degree AV block    Current Outpatient Prescriptions  Medication Sig Dispense Refill  . aspirin 81 MG chewable tablet Chew 81 mg by mouth daily.      . clopidogrel (PLAVIX) 75 MG tablet Take 75 mg by mouth daily with breakfast.      . cyclobenzaprine (FLEXERIL) 10 MG tablet Take 1 tablet (10 mg total) by mouth at bedtime.  30 tablet  6  . ibuprofen (ADVIL,MOTRIN) 200 MG tablet Take 400 mg by mouth every 8 (eight) hours as needed.      Marland Kitchen losartan (COZAAR) 25 MG tablet Take 1 tablet (25 mg total) by mouth daily.  90 tablet  3  . nitroGLYCERIN (NITROSTAT) 0.4 MG SL tablet Place 0.4 mg under the tongue every 5 (five) minutes as needed for chest pain.      . pantoprazole (PROTONIX) 40 MG tablet Take 40 mg by mouth every morning.      . simvastatin (ZOCOR) 10 MG tablet Take 10 mg by mouth daily at 6 PM.      . tamsulosin (FLOMAX) 0.4 MG CAPS capsule Take 2 capsules (0.8 mg total) by mouth daily.  60 capsule  6   No current facility-administered medications for this visit.    Physical Exam: Filed Vitals:   10/08/13 1142  BP: 140/86  Pulse: 77  Height: 6' (1.829 m)  Weight: 228 lb (103.42 kg)    GEN- The patient is well appearing, alert and oriented x 3 today.   Head- normocephalic, atraumatic Eyes-  Sclera clear, conjunctiva pink Ears- hearing intact Oropharynx- clear Lungs- Clear to ausculation bilaterally, normal work of breathing Chest- pacemaker pocket is well healed Heart- Regular rate and rhythm, no murmurs, rubs or gallops, PMI not laterally displaced GI- soft, NT, ND, + BS Extremities- no clubbing, cyanosis, or edema  Pacemaker interrogation- reviewed in detail today,  See PACEART report  Assessment and Plan:  1. Second degree AV block Normal pacemaker function See Claudia Desanctis Art report Today he is in complete heart block.  He seems clearly symptomatic with V pacing.  At  times he has complete heart block and at other times not.  I have not been able to find the correct AV timing to resolve his symptoms though he did very well for a while with his present settings after AV optimization with echo.  Though LV lead placement could potentially improve his symptoms, his EF is preserved.  I do not want to perform any procedures without further direction. Given our exhaustive efforts with ongoing symptoms, I think that the most prudent step at this point is to discuss further with Dr [name].   I have encouraged the patient to return to his office as recommended.  I tried to call Dr [name] today but am told that he is on vacation.  I will follow-up with him personally.  I will stop diltiazem today as this doesn't seem to be beneficial.  I will also check labs to look for other causes of his advanced fatigue.  I will see him again in 2 months

## 2013-10-14 ENCOUNTER — Ambulatory Visit (INDEPENDENT_AMBULATORY_CARE_PROVIDER_SITE_OTHER): Payer: BC Managed Care – PPO | Admitting: Family Medicine

## 2013-10-14 ENCOUNTER — Encounter: Payer: Self-pay | Admitting: Family Medicine

## 2013-10-14 VITALS — BP 126/80 | Temp 98.3°F | Ht 72.0 in | Wt 231.0 lb

## 2013-10-14 DIAGNOSIS — N410 Acute prostatitis: Secondary | ICD-10-CM

## 2013-10-14 MED ORDER — HYDROCODONE-ACETAMINOPHEN 10-325 MG PO TABS: 1.0000 | ORAL_TABLET | ORAL | Status: DC | PRN

## 2013-10-14 MED ORDER — LEVOFLOXACIN 500 MG PO TABS: 500.0000 mg | ORAL_TABLET | Freq: Every day | ORAL | Status: DC

## 2013-10-14 NOTE — Progress Notes (Signed)
   Subjective:    Patient ID: [name], male    DOB: 1955-08-27, 58 y.o.   MRN: [id number]  Abdominal Pain This is a new problem. Episode onset: About a month ago. The problem occurs every several days. The problem has been waxing and waning. The pain is located in the suprapubic region. The abdominal pain radiates to the LLQ and RUQ. Pertinent negatives include no constipation, diarrhea, nausea or vomiting. The pain is aggravated by movement. Relieved by: time.  Groin Pain Associated symptoms include abdominal pain. Pertinent negatives include no chest pain, constipation, coughing, diarrhea, nausea or vomiting.    PMH benign Review of Systems  Respiratory: Negative for cough.   Cardiovascular: Negative for chest pain and palpitations.  Gastrointestinal: Positive for abdominal pain. Negative for nausea, vomiting, diarrhea, constipation, blood in stool, abdominal distention, anal bleeding and rectal pain.       Objective:   Physical Exam Lower abdomen mild tenderness prostate is severely enlarged very tender. Scrotal area the epididymis on both sides are tender no swelling no hernia. Patient not toxic.       Assessment & Plan:  Severe tenderness of the prostate. Prostatitis antibiotics for the next 3-4 weeks followup in approximately 3-4 weeks time no need for lab testing rescan warning signs discussed if high fever or worse followup sooner  Pain medication as needed, patient states he tolerates Vicodin

## 2013-10-16 ENCOUNTER — Ambulatory Visit (INDEPENDENT_AMBULATORY_CARE_PROVIDER_SITE_OTHER): Payer: BC Managed Care – PPO | Admitting: Pulmonary Disease

## 2013-10-16 ENCOUNTER — Encounter: Payer: Self-pay | Admitting: Pulmonary Disease

## 2013-10-16 VITALS — BP 100/68 | HR 78 | Temp 97.8°F | Ht 72.0 in | Wt 229.6 lb

## 2013-10-16 DIAGNOSIS — G4733 Obstructive sleep apnea (adult) (pediatric): Secondary | ICD-10-CM

## 2013-10-16 NOTE — Patient Instructions (Signed)
Will schedule for sleep study at [facility] to re-evaluate your sleep apnea and to see if you have a movement disorder of sleep that can respond to treatment. Will arrange followup to discuss once the results are available.

## 2013-10-16 NOTE — Progress Notes (Signed)
Subjective:    Patient ID: [name], male    DOB: 01/19/1956, 58 y.o.   MRN: [id number]  HPI The patient is a 58 year old male who I've been asked to see for restless sleep. He tells me that he has been diagnosed with sleep apnea in the past, and was tried on CPAP with great difficulty. There were issues getting a mask that fits with his facial hair, and also his restlessness caused him to get tied up in the hose.  The patient currently has been noted to have loud snoring as well as an abnormal breathing pattern during sleep. He is very restless during the night, and has frequent awakenings. He is not rested in the mornings upon arising. He has been noted to have severe kicking during the night according to his bed partner, but denies classic RLS symptoms in the evening. He has an aching behind his knees, and movement doesn't seem to help that much. The patient feels tired during the day with some inappropriate daytime sleepiness with inactivity. He has occasional sleepiness in the evenings watching television or reading. He denies any sleepiness with driving. The patient states that his weight is up about 10-15 pounds over the last few years, and his Epworth score today is only 5.   Sleep Questionnaire What time do you typically go to bed?( Between what hours) 10pm-2 am 10pm-2 am at 1009 on 10/16/13 by Lilli Few, CMA How long does it take you to fall asleep? 10 minutes to 1 hour 10 minutes to 1 hour at 1009 on 10/16/13 by Lilli Few, CMA How many times during the night do you wake up? 5 5 at 1009 on 10/16/13 by Lilli Few, CMA What time do you get out of bed to start your day? 0700 0700 at 1009 on 10/16/13 by Lilli Few, CMA Do you drive or operate heavy machinery in your occupation? No No at 1009 on 10/16/13 by Lilli Few, CMA How much has your weight changed (up or down) over the past two years? (In pounds) 15 lb (6.804 kg) 15 lb  (6.804 kg) at 1009 on 10/16/13 by Lilli Few, CMA Have you ever had a sleep study before? Yes Yes at 1009 on 10/16/13 by Lilli Few, CMA If yes, location of study? [place]  at 1009 on 10/16/13 by Lilli Few, CMA If yes, date of study? unsure unsure at 1009 on 10/16/13 by Lilli Few, CMA Do you currently use CPAP? No No at 1009 on 10/16/13 by Lilli Few, CMA Do you wear oxygen at any time? No No at 1009 on 10/16/13 by Lilli Few, CMA   Review of Systems  Constitutional: Negative for fever and unexpected weight change.  HENT: Negative for congestion, dental problem, ear pain, nosebleeds, postnasal drip, rhinorrhea, sinus pressure, sneezing, sore throat and trouble swallowing.   Eyes: Negative for redness and itching.  Respiratory: Negative for cough, chest tightness, shortness of breath and wheezing.   Cardiovascular: Negative for palpitations and leg swelling.  Gastrointestinal: Negative for nausea and vomiting.  Genitourinary: Negative for dysuria.  Musculoskeletal: Negative for joint swelling.  Skin: Negative for rash.  Neurological: Negative for headaches.  Hematological: Does not bruise/bleed easily.  Psychiatric/Behavioral: Negative for dysphoric mood. The patient is not nervous/anxious.        Objective:   Physical Exam Constitutional:  Overweight male, no acute distress  HENT:  Nares patent without discharge, but narrowed with inflammed turbinates  Oropharynx without exudate, palate and uvula are thick and elongated.   Eyes:  Perrla, eomi, no scleral icterus  Neck:  No JVD, no TMG  Cardiovascular:  Normal rate, regular rhythm, no rubs or gallops.  No murmurs        Intact distal pulses  Pulmonary :  Normal breath sounds, no stridor or respiratory distress   No rales, rhonchi, or wheezing  Abdominal:  Soft, nondistended, bowel sounds present.  No tenderness noted.   Musculoskeletal:  No  lower extremity edema noted.  Lymph Nodes:  No cervical lymphadenopathy noted  Skin:  No cyanosis noted  Neurologic:  Alert, appropriate, moves all 4 extremities without obvious deficit.         Assessment & Plan:

## 2013-10-16 NOTE — Assessment & Plan Note (Addendum)
The patient tells me that he has a history of sleep apnea in the past, and was treated with CPAP for this. However, he had difficulties with mask fit because of his facial hair, and also had issues with persistent restlessness. His current history is very suggestive of sleep disordered breathing, and although he does have a lot of kicking during the night, it is not classic for the restless leg syndrome.  He is clearly very symptomatic, and wishes to pursue whatever workup is necessary to get him sleeping better.  Will schedule for sleep study to evaluate for OSA and a movement disorder of sleep.

## 2013-10-21 ENCOUNTER — Ambulatory Visit: Payer: BC Managed Care – PPO | Attending: Pulmonary Disease | Admitting: Sleep Medicine

## 2013-10-21 ENCOUNTER — Other Ambulatory Visit: Payer: Self-pay | Admitting: *Deleted

## 2013-10-21 VITALS — Ht 72.0 in | Wt 227.0 lb

## 2013-10-21 DIAGNOSIS — G4733 Obstructive sleep apnea (adult) (pediatric): Secondary | ICD-10-CM | POA: Insufficient documentation

## 2013-10-21 MED ORDER — CLOPIDOGREL BISULFATE 75 MG PO TABS: 75.0000 mg | ORAL_TABLET | Freq: Every day | ORAL | Status: DC

## 2013-10-24 ENCOUNTER — Telehealth: Payer: Self-pay | Admitting: Cardiology

## 2013-10-24 NOTE — Telephone Encounter (Signed)
Called and spoke with pt wife and requested that pt send manual transmission from his carelink smart due to upgrades. She said she would get pt to try this when he gets home.

## 2013-10-31 ENCOUNTER — Encounter: Payer: Self-pay | Admitting: Cardiology

## 2013-11-03 ENCOUNTER — Ambulatory Visit: Payer: BC Managed Care – PPO | Admitting: Family Medicine

## 2013-11-04 ENCOUNTER — Ambulatory Visit: Payer: BC Managed Care – PPO | Admitting: Family Medicine

## 2013-11-04 ENCOUNTER — Telehealth: Payer: Self-pay | Admitting: Family Medicine

## 2013-11-04 NOTE — Telephone Encounter (Signed)
Thursday am or Friday am

## 2013-11-04 NOTE — Telephone Encounter (Signed)
Appt scheduled and he was very thankful.

## 2013-11-04 NOTE — Telephone Encounter (Signed)
Patient no-showed his appointment for today due to an "emergency" and wants to know when he can come back in because it was for a recheck from his last visit. He said he can come anytime this week.

## 2013-11-04 NOTE — Telephone Encounter (Signed)
Due to me being unavailable next week, choices are Thursday morning or Friday morning

## 2013-11-07 ENCOUNTER — Ambulatory Visit (INDEPENDENT_AMBULATORY_CARE_PROVIDER_SITE_OTHER): Payer: BC Managed Care – PPO | Admitting: Family Medicine

## 2013-11-07 ENCOUNTER — Encounter: Payer: Self-pay | Admitting: Family Medicine

## 2013-11-07 ENCOUNTER — Telehealth: Payer: Self-pay | Admitting: Pulmonary Disease

## 2013-11-07 VITALS — BP 140/90 | Ht 72.0 in | Wt 228.4 lb

## 2013-11-07 DIAGNOSIS — N410 Acute prostatitis: Secondary | ICD-10-CM

## 2013-11-07 DIAGNOSIS — G4730 Sleep apnea, unspecified: Secondary | ICD-10-CM

## 2013-11-07 DIAGNOSIS — F439 Reaction to severe stress, unspecified: Secondary | ICD-10-CM

## 2013-11-07 DIAGNOSIS — Z733 Stress, not elsewhere classified: Secondary | ICD-10-CM

## 2013-11-07 DIAGNOSIS — G4710 Hypersomnia, unspecified: Secondary | ICD-10-CM

## 2013-11-07 MED ORDER — ALPRAZOLAM 0.5 MG PO TABS: 0.5000 mg | ORAL_TABLET | Freq: Three times a day (TID) | ORAL | Status: DC | PRN

## 2013-11-07 MED ORDER — LEVOFLOXACIN 500 MG PO TABS: 500.0000 mg | ORAL_TABLET | Freq: Every day | ORAL | Status: AC

## 2013-11-07 NOTE — Sleep Study (Signed)
   NAME: [name] DATE OF BIRTH:  10/06/1955 MEDICAL RECORD NUMBER [id number]  [facility]  PHYSICIAN: Kathee Delton  DATE OF STUDY: 10/21/2013  SLEEP STUDY TYPE: Nocturnal Polysomnogram               REFERRING PHYSICIAN: Toshio Slusher, Armando Reichert, MD  INDICATION FOR STUDY: Hypersomnia with sleep apnea  EPWORTH SLEEPINESS SCORE:  5 HEIGHT: 6' (182.9 cm)  WEIGHT: 227 lb (102.967 kg)    Body mass index is 30.78 kg/(m^2).  NECK SIZE: 18.5 in.  MEDICATIONS: Reviewed in the sleep record  SLEEP ARCHITECTURE: The patient had a total sleep time of 240 minutes, with very little slow-wave sleep and only 13 minutes of REM. Sleep onset latency was normal at 11 minutes, and REM onset was prolonged at 153 minutes. Sleep efficiency was poor at 66%.  RESPIRATORY DATA: The patient had 27 obstructive and central apneas, along with 46 obstructive hypopneas, giving him an AHI of 18 events per hour. The events occurred in all body positions, and there was loud snoring noted throughout.  OXYGEN DATA: Oxygen desaturation as low as 85% occurred with the patient's obstructive and central events.  CARDIAC DATA: Rare fusion beat noted  MOVEMENT/PARASOMNIA: Leg jerks were noted during the night, but there were no specific periodic limb movements of significance noted.  IMPRESSION/ RECOMMENDATION:   1) mild to moderate obstructive and central sleep apnea, with an AHI of 18 events per hour and oxygen desaturation as low as 85%. Treatment for this degree of sleep apnea can include a trial of weight loss alone, upper airway surgery, dental appliance, and also CPAP. Clinical correlation is suggested.  2) rare fusion beat noted, but no clinically significant arrhythmias were seen.    Kathee Delton Diplomate, American Board of Sleep Medicine  ELECTRONICALLY SIGNED ON:  11/07/2013, 2:41 PM [place] PH: [phone]   FX: [phone] [place]

## 2013-11-07 NOTE — Telephone Encounter (Signed)
Pt needs ov to review his sleep study 

## 2013-11-07 NOTE — Progress Notes (Signed)
   Subjective:    Patient ID: [name], male    DOB: 10-Jun-1955, 58 y.o.   MRN: [id number]  HPI Patient arrives to recheck prostate. He states to have prostate symptoms he is concerned about this. He denies any hematuria rectal bleeding does not wake him up at night recent PSA looks good. He does not want to have a prostate exam today.  He is very stressed out over some business issues. 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 He denies being depressed about it denies being suicidal.   Review of Systems No fevers no vomiting    Objective:   Physical Exam  Tearful when talking about the stress he relates well otherwise lungs clear heart regular      Assessment & Plan:  Not suicidal-he assures me he is not suicidal. We talked about how if he started feeling that way he needs to immediately be seen here or ER. I also encouraged him to focus always every day on some positive aspects of his life including his wife and child.  Not depressed-extreme anxiety and stress related with this recent issue. He denies being depressed. He states he is just anxious and it's making him sick to the stomach and like he does not want to eat. Xanax 0.5 mg 1 3 times a day when necessary #30 with 1 refill. Recommend followup in 2 weeks' time. Followup sooner if any problems patient was doing well before this episode occurred.  Prostate still giving him some issues 28 more days medicine recheck prostate on followup visit  Situational stress-having situational stress he does talk with his wife about it he feels that things will hopefully get better. Willing to try medication.

## 2013-11-07 NOTE — Telephone Encounter (Addendum)
[id number]. You can use any of the 4:30 RN slots as well for this pt.  [place] Bing, CMA

## 2013-11-11 NOTE — Telephone Encounter (Signed)
Spoke with pt and given appt on 11/26/13 at 4:30.

## 2013-11-17 ENCOUNTER — Other Ambulatory Visit: Payer: Self-pay | Admitting: Family Medicine

## 2013-11-19 ENCOUNTER — Telehealth: Payer: Self-pay | Admitting: *Deleted

## 2013-11-19 NOTE — Telephone Encounter (Signed)
Called and spoke with patient.  Symptoms have remained the same.  Dr Rayann Heman has spoken with Dr Norville Haggard (at [facility]) and both agree that leads should be moved with possible LV lead placement.  The patient has not heard from [facility] about a referral.  I will ask PCC's to help arrange follow-up with Dr Rosita Fire for patient and to contact the patient and let him know this has been done.

## 2013-11-19 NOTE — Telephone Encounter (Signed)
Appointment with Dr. Rosita Fire scheduled for 12/16/2013 @ 9:45, pt has been notified.

## 2013-11-21 ENCOUNTER — Ambulatory Visit (INDEPENDENT_AMBULATORY_CARE_PROVIDER_SITE_OTHER): Payer: BC Managed Care – PPO | Admitting: Family Medicine

## 2013-11-21 ENCOUNTER — Encounter: Payer: Self-pay | Admitting: Family Medicine

## 2013-11-21 VITALS — BP 140/80 | Ht 72.0 in | Wt 228.0 lb

## 2013-11-21 DIAGNOSIS — M7660 Achilles tendinitis, unspecified leg: Secondary | ICD-10-CM

## 2013-11-21 DIAGNOSIS — L821 Other seborrheic keratosis: Secondary | ICD-10-CM

## 2013-11-21 DIAGNOSIS — M7661 Achilles tendinitis, right leg: Secondary | ICD-10-CM

## 2013-11-21 DIAGNOSIS — M722 Plantar fascial fibromatosis: Secondary | ICD-10-CM

## 2013-11-21 DIAGNOSIS — N400 Enlarged prostate without lower urinary tract symptoms: Secondary | ICD-10-CM

## 2013-11-21 DIAGNOSIS — N410 Acute prostatitis: Secondary | ICD-10-CM

## 2013-11-21 MED ORDER — FINASTERIDE 5 MG PO TABS: 5.0000 mg | ORAL_TABLET | Freq: Every day | ORAL | Status: DC

## 2013-11-21 NOTE — Progress Notes (Signed)
   Subjective:    Patient ID: [name], male    DOB: [birth year], 58 y.o.   MRN: [id number]  HPI Patient is here today for a follow up visit on acute prostatitis. Patient is still currently taking Levaquin. Patient states that his symptoms are a little better.  Patient states that his left heel has been hurting for awhile and he would like to discuss this with the doctor.    Review of Systems     Objective:   Physical Exam Lungs are clear hearts regular prostate is enlarged on exam mild tenderness area on the right side of his face does not appear cancerous it appears to be a seborrheic keratosis  Patient with left heel pain Achilles pain I feel this is tendinitis     Assessment & Plan:  #1 prostatitis continue antibiotics may need additional refill when it is filled  #2 BPH continue Flomax. Add Proscar one daily for the next 3 months, recheck patient in 3 months time patient was told that if he is not seen significant improvement with his nocturia the next step would be referral to urology for cystoscopy  #3 benign skin lesion right side of face recheck this in 3 months patient may want to see dermatologist if he keeps bothering him because he may benefit from having it frozen  #4 heel pain/plantar fasciitis Achilles tendinitis stretching exercises cold compresses Tylenol if ongoing trouble referral to podiatry cannot take NSAIDs  It should be noted that the recent mental stress issues that he had going on is now doing much better so hopefully things will go well he was encouraged to followup with [place] if problems

## 2013-11-24 NOTE — Telephone Encounter (Signed)
I do not need to see him until after he has been seen by Dr Leonidas Romberg. Please call to reschedule with me for 2-4 weeks afterwards.

## 2013-11-26 ENCOUNTER — Ambulatory Visit (INDEPENDENT_AMBULATORY_CARE_PROVIDER_SITE_OTHER): Payer: BC Managed Care – PPO | Admitting: Pulmonary Disease

## 2013-11-26 ENCOUNTER — Encounter: Payer: Self-pay | Admitting: Pulmonary Disease

## 2013-11-26 VITALS — BP 132/78 | HR 84 | Temp 97.9°F | Ht 72.0 in | Wt 234.0 lb

## 2013-11-26 DIAGNOSIS — G4733 Obstructive sleep apnea (adult) (pediatric): Secondary | ICD-10-CM

## 2013-11-26 NOTE — Patient Instructions (Signed)
Work on weight loss over the next 6 mos.  If you are not making progress, please call me to consider a dental appliance or cpap for treatment. If you are making progress on weight loss, keep going.  Goal weight is 190 pounds

## 2013-11-26 NOTE — Assessment & Plan Note (Signed)
The patient has mild to moderate OSA by his recent sleep study, but does not feel that he is overly symptomatic (though he is symptomatic to some degree).  This degree of sleep apnea does not represent a significant risk to his cardiovascular health, and therefore I have outlined a conservative path with a trial of weight loss alone versus more aggressive treatment with a dental appliance or CPAP. The patient feels that he is very motivated to lose weight, and would like to take the next 6 months to work on this. If he is not successful, he would consider a dental appliance.

## 2013-11-26 NOTE — Progress Notes (Signed)
   Subjective:    Patient ID: [name], male    DOB: 1955-09-19, 58 y.o.   MRN: [id number]  HPI The patient comes in today for followup of his recent sleep study. He was found to have mild to moderate OSA, with an AHI of 18 events per hour and only mild desaturation. He had no significant periodic limb movements. I have reviewed the study with him in detail, and answered all of his questions.   Review of Systems  Constitutional: Negative for fever and unexpected weight change.  HENT: Positive for sinus pressure and trouble swallowing. Negative for congestion, dental problem, ear pain, nosebleeds, postnasal drip, rhinorrhea, sneezing and sore throat.   Eyes: Negative for redness and itching.  Respiratory: Positive for chest tightness and shortness of breath. Negative for cough and wheezing.   Cardiovascular: Positive for chest pain, palpitations and leg swelling.  Gastrointestinal: Negative for nausea and vomiting.  Genitourinary: Negative for dysuria.  Musculoskeletal: Negative for joint swelling.  Skin: Negative for rash.  Neurological: Negative for headaches.  Hematological: Does not bruise/bleed easily.  Psychiatric/Behavioral: Negative for dysphoric mood. The patient is not nervous/anxious.        Objective:   Physical Exam Ow male in nad Nose without purulence or d/c noted. Neck without LN or TMG LE without edema, no cyanosis Alert, oriented, moves all 4.        Assessment & Plan:

## 2013-11-28 ENCOUNTER — Other Ambulatory Visit: Payer: Self-pay

## 2013-11-28 MED ORDER — SIMVASTATIN 10 MG PO TABS: 10.0000 mg | ORAL_TABLET | Freq: Every day | ORAL | Status: DC

## 2013-12-04 ENCOUNTER — Encounter: Payer: BC Managed Care – PPO | Admitting: Internal Medicine

## 2013-12-05 ENCOUNTER — Encounter: Payer: Self-pay | Admitting: Cardiology

## 2013-12-08 ENCOUNTER — Encounter: Payer: BC Managed Care – PPO | Admitting: Internal Medicine

## 2013-12-18 ENCOUNTER — Other Ambulatory Visit: Payer: Self-pay | Admitting: Internal Medicine

## 2013-12-29 ENCOUNTER — Telehealth: Payer: Self-pay | Admitting: Family Medicine

## 2013-12-29 MED ORDER — HYDROCODONE-ACETAMINOPHEN 5-325 MG PO TABS: 1.0000 | ORAL_TABLET | ORAL | Status: DC | PRN

## 2013-12-29 MED ORDER — CIPROFLOXACIN HCL 500 MG PO TABS: 500.0000 mg | ORAL_TABLET | Freq: Two times a day (BID) | ORAL | Status: DC

## 2013-12-29 NOTE — Telephone Encounter (Signed)
Nurse's please talk with the patient. Confirm what his symptoms are. If this patient is running fevers or having severe pain he probably should be rechecked. If more so he is just having urgency and slight discomfort we can do the following: Cipro 500 mg 1 twice a day for 3 weeks if patient is not improving in the next 7 days to followup may have hydrocodone 5 mg/325 mg, #30, one every 4 hours when necessary severe pain cautioned drowsiness, if ongoing trouble followup. Finally patient was instructed to keep to keep a followup office visit in November very important to do so

## 2013-12-29 NOTE — Telephone Encounter (Signed)
Left message to return call 

## 2013-12-29 NOTE — Telephone Encounter (Signed)
Patient said that it seems that his prostate has inflamed again and would like a refill on antibiotic and pain meds.   [facility]

## 2013-12-29 NOTE — Telephone Encounter (Signed)
Patient states his sx are not severe-no fever-he can just tell the sx are returning and wants to catch it before it gets bad. Rx sent electronically to pharmacy. Rx up front for pick up. Patient notified.

## 2013-12-31 ENCOUNTER — Encounter: Payer: Self-pay | Admitting: Gastroenterology

## 2013-12-31 ENCOUNTER — Encounter: Payer: BC Managed Care – PPO | Admitting: Internal Medicine

## 2014-01-20 ENCOUNTER — Ambulatory Visit (INDEPENDENT_AMBULATORY_CARE_PROVIDER_SITE_OTHER): Payer: BC Managed Care – PPO | Admitting: *Deleted

## 2014-01-20 DIAGNOSIS — Z23 Encounter for immunization: Secondary | ICD-10-CM

## 2014-01-25 ENCOUNTER — Other Ambulatory Visit: Payer: Self-pay | Admitting: Family Medicine

## 2014-01-25 ENCOUNTER — Other Ambulatory Visit: Payer: Self-pay | Admitting: Internal Medicine

## 2014-01-26 ENCOUNTER — Other Ambulatory Visit: Payer: Self-pay

## 2014-01-26 NOTE — Telephone Encounter (Signed)
May have this +2 refills 

## 2014-02-13 ENCOUNTER — Ambulatory Visit (INDEPENDENT_AMBULATORY_CARE_PROVIDER_SITE_OTHER): Payer: BC Managed Care – PPO | Admitting: Internal Medicine

## 2014-02-13 ENCOUNTER — Encounter: Payer: Self-pay | Admitting: Internal Medicine

## 2014-02-13 VITALS — BP 142/74 | HR 84 | Ht 72.0 in | Wt 239.8 lb

## 2014-02-13 DIAGNOSIS — I441 Atrioventricular block, second degree: Secondary | ICD-10-CM

## 2014-02-13 DIAGNOSIS — Z950 Presence of cardiac pacemaker: Secondary | ICD-10-CM

## 2014-02-13 LAB — MDC_IDC_ENUM_SESS_TYPE_INCLINIC
Battery Impedance: 110 Ohm
Battery Remaining Longevity: 133 mo
Battery Voltage: 2.79 V
Brady Statistic AP VP Percent: 2 %
Brady Statistic AP VS Percent: 0 %
Brady Statistic AS VP Percent: 98 %
Brady Statistic AS VS Percent: 0 %
Date Time Interrogation Session: 20151030172231
Lead Channel Impedance Value: 445 Ohm
Lead Channel Impedance Value: 568 Ohm
Lead Channel Pacing Threshold Amplitude: 0.5 V
Lead Channel Pacing Threshold Amplitude: 0.5 V
Lead Channel Pacing Threshold Pulse Width: 0.4 ms
Lead Channel Pacing Threshold Pulse Width: 0.4 ms
Lead Channel Sensing Intrinsic Amplitude: 11.2 mV
Lead Channel Sensing Intrinsic Amplitude: 4 mV
Lead Channel Setting Pacing Amplitude: 2 V
Lead Channel Setting Pacing Amplitude: 2.5 V
Lead Channel Setting Pacing Pulse Width: 0.4 ms
Lead Channel Setting Sensing Sensitivity: 4 mV

## 2014-02-13 NOTE — Patient Instructions (Signed)
Your physician recommends that you schedule a follow-up appointment in: 2 months with Dr Rayann Heman

## 2014-02-14 NOTE — Progress Notes (Signed)
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  Since last being seen in our clinic, the patient reports ongoing chest discomfort and worsening exercise intolerance.   His chest pain is described as "chest tightness" with exertion.  We have been able to reproduce this with V pacing previously.  He has done best with AV optimization and a very short AV timing.  I had him cathed 06/11/13 and progressive CAD was excluded as a cause.  He has also had a prior CT which did not reveal PTE.   He went to see Dr Leonidas Romberg again at [facility] but has not heard from the recently. Today, he denies symptoms of palpitations, dizziness, presyncope, or syncope.  The patient is otherwise without complaint today.   Past Medical History  Diagnosis Date  . Hypertension   . GERD (gastroesophageal reflux disease)   . Colon polyps 2011    tubular adenoma by excisional biopsy during colonoscopy  . ADD (attention deficit disorder)     Rx-Adderall  . Degenerative disc disease, cervical   . Palpitations 2003    Holter in 2003: PACs and PVCs; negative stress nuclear test in 2005; right bundle branch block; echo in 2008-mild LVH, otherwise normal; 2008-negative stress nuclear test.  . Prostatitis     prostate calcifications by CT  . Sleep apnea     questionable diagnosis  . Pneumonia   . Anginal pain   . Complete heart block   . Coronary artery disease    Past Surgical History  Procedure Laterality Date  . Anterior cervical decomp/discectomy fusion    . Knee surgery      rt  . Colonoscopy w/ polypectomy  2011    tubular adenoma  . Esophagogastroduodenoscopy      Gastritis  . Pacemaker insertion  12/17/12    MDT Adapta L implanted by Dr Rayann Heman for mobitz II second degree AV block    Current Outpatient Prescriptions  Medication Sig Dispense Refill  . ALPRAZolam (XANAX) 0.5 MG tablet TAKE 1 TABLET 3 TIMES A DAY AS  NEEDED FOR SLEEP AND ANXIETY  30 tablet  2  . aspirin 81 MG chewable tablet Chew 81 mg by mouth daily.      Marland Kitchen CARTIA XT 240 MG 24 hr capsule Take 240 mg by mouth daily.       . clopidogrel (PLAVIX) 75 MG tablet take 1 tablet by mouth once daily  30 tablet  0  . ibuprofen (ADVIL,MOTRIN) 200 MG tablet Take 400 mg by mouth every 8 (eight) hours as needed (pain).       Marland Kitchen losartan (COZAAR) 25 MG tablet Take 1 tablet (25 mg total) by mouth daily.  90 tablet  3  . nitroGLYCERIN (NITROSTAT) 0.4 MG SL tablet Place 0.4 mg under the tongue every 5 (five) minutes as needed (MAX 3 TABLETS).       . pantoprazole (PROTONIX) 40 MG tablet Take 1 tablet by mouth daily.      . simvastatin (ZOCOR) 10 MG tablet Take 1 tablet (10 mg total) by mouth daily at 6 PM.  30 tablet  6  . tamsulosin (FLOMAX) 0.4 MG CAPS capsule Take 2 capsules (0.8 mg total) by mouth daily.  60 capsule  6   No current facility-administered medications for this visit.    Physical Exam: Filed Vitals:   02/13/14 1650  BP: 142/74  Pulse: 84  Height: 6' (1.829 m)  Weight: 239 lb 12.8 oz (108.773 kg)    GEN- The patient is well appearing, alert and oriented x 3 today.   Head- normocephalic, atraumatic Eyes-  Sclera clear, conjunctiva pink Ears- hearing intact Oropharynx- clear Lungs- Clear to ausculation bilaterally, normal work of breathing Chest- pacemaker pocket is well healed Heart- Regular rate and rhythm, no murmurs, rubs or gallops, PMI not laterally displaced GI- soft, NT, ND, + BS Extremities- no clubbing, cyanosis, or edema  Pacemaker interrogation- reviewed in detail today,  See PACEART report  Assessment and Plan:  1. Second degree AV block Normal pacemaker function See Claudia Desanctis Art report Today he is in complete heart block.  I had a long discussion with the patient today.  I tried to call Dr Rosita Fire but he is in [place].  I spoke with his partner Dr Orvil Feil.  We agree that the best approach for this patient is probably  ppm system revision.  I think that "his bundle pacing" with removal of the current RV lead (due to concerns for microperforation) is likely the best approach going forward.  I will speak with Dr Leonidas Romberg when he returns from vacation next week.  We will then decide whether to perform the procedure at [facility] or at [facility].   I will see him again in 2 months

## 2014-02-19 ENCOUNTER — Ambulatory Visit (INDEPENDENT_AMBULATORY_CARE_PROVIDER_SITE_OTHER): Payer: BC Managed Care – PPO | Admitting: Family Medicine

## 2014-02-19 ENCOUNTER — Encounter: Payer: Self-pay | Admitting: Family Medicine

## 2014-02-19 VITALS — BP 128/76 | Ht 72.0 in | Wt 237.0 lb

## 2014-02-19 DIAGNOSIS — F439 Reaction to severe stress, unspecified: Secondary | ICD-10-CM

## 2014-02-19 DIAGNOSIS — M79672 Pain in left foot: Secondary | ICD-10-CM

## 2014-02-19 DIAGNOSIS — Z658 Other specified problems related to psychosocial circumstances: Secondary | ICD-10-CM

## 2014-02-19 DIAGNOSIS — N419 Inflammatory disease of prostate, unspecified: Secondary | ICD-10-CM

## 2014-02-19 DIAGNOSIS — M7752 Other enthesopathy of left foot: Secondary | ICD-10-CM

## 2014-02-19 DIAGNOSIS — R0600 Dyspnea, unspecified: Secondary | ICD-10-CM

## 2014-02-19 DIAGNOSIS — R0609 Other forms of dyspnea: Secondary | ICD-10-CM

## 2014-02-19 DIAGNOSIS — M779 Enthesopathy, unspecified: Secondary | ICD-10-CM

## 2014-02-19 LAB — POCT URINALYSIS DIPSTICK
Spec Grav, UA: <=1.005
pH, UA: 6

## 2014-02-19 NOTE — Progress Notes (Signed)
   Subjective:    Patient ID: [name], male    DOB: 07/21/1955, 58 y.o.   MRN: [id number]  HPIRecheck urine. Had prostate infection. Finished antibiotic. No urinary symptoms now.  Left heel pain. Started 3 -4 months ago. Patient relates heel pain discomfort with walking his had this ongoing tried cold compresses anti-inflammatories stretching without help  Having chest pain for the past year. Trouble with Psychologist, forensic. Has a cardiologist and has discussed with him. [facility]. Weight gain. Cannot walk without getting chest pain and shortness of breath.   Long discussion held with patient regarding the fact that cardiology needs to work with him at [facility] to try to get this doing better this is a difficult issue. There is a strong possibility that they will not be oh to get this better. Patient under a lot of stress because this unable unable to work because of all of this   Review of Systems He relates shortness of breath with activity chest pain with activity he also relates heel pain    Objective:   Physical Exam Eardrums normal throat normal neck no masses Lungs clear no crackles Heart rate controlled Blood pressure good on recheck Extremities no edema Tenderness in the left heel.  25 minutes spent with patient     Assessment & Plan:  Left heel pain possible bone spur x-ray indicated may need an injection of steroid  Patient with significant dyspnea on exertion along with chest pain he relates any physical activity even very Monday/mild triggers problems because of this this gentleman has not been able to do any physical activity at all and has not been able to work in 1 years time the currently cardiology is working with him but there is a chance that this will not get better currently the patient is disabled with this and unable to effectively work because of this cardiac condition

## 2014-02-20 ENCOUNTER — Other Ambulatory Visit: Payer: Self-pay

## 2014-02-20 ENCOUNTER — Ambulatory Visit: Payer: BC Managed Care – PPO | Admitting: Family Medicine

## 2014-02-20 MED ORDER — DILTIAZEM HCL ER COATED BEADS 240 MG PO CP24: 240.0000 mg | ORAL_CAPSULE | Freq: Every day | ORAL | Status: DC

## 2014-02-20 MED ORDER — CLOPIDOGREL BISULFATE 75 MG PO TABS: ORAL_TABLET | ORAL | Status: DC

## 2014-02-25 ENCOUNTER — Telehealth: Payer: Self-pay | Admitting: Family Medicine

## 2014-02-25 NOTE — Telephone Encounter (Signed)
It should be noted that the nurse did call Dr. Jamal Collin with cardiology at [facility] their office stated that they will call the patient to reschedule his test.

## 2014-03-06 ENCOUNTER — Ambulatory Visit (HOSPITAL_COMMUNITY)
Admission: RE | Admit: 2014-03-06 | Discharge: 2014-03-06 | Disposition: A | Discharge status: Home / Self Care | Payer: BC Managed Care – PPO | Source: Ambulatory Visit | Attending: Family Medicine | Admitting: Family Medicine

## 2014-03-06 DIAGNOSIS — M79672 Pain in left foot: Secondary | ICD-10-CM | POA: Insufficient documentation

## 2014-03-06 DIAGNOSIS — M7732 Calcaneal spur, left foot: Secondary | ICD-10-CM | POA: Diagnosis not present

## 2014-03-09 NOTE — Addendum Note (Signed)
Addended by: Carmelina Noun on: 03/09/2014 05:58 PM   Modules accepted: Orders

## 2014-03-09 NOTE — Addendum Note (Signed)
Addended by: Carmelina Noun on: 03/09/2014 05:56 PM   Modules accepted: Orders

## 2014-03-09 NOTE — Progress Notes (Signed)
Discussed with patient. He does want to see Dr. Caprice Beaver. Order for referral put in.

## 2014-03-26 ENCOUNTER — Encounter (HOSPITAL_COMMUNITY): Payer: Self-pay | Admitting: Cardiovascular Disease

## 2014-03-30 ENCOUNTER — Encounter: Payer: Self-pay | Admitting: Family Medicine

## 2014-04-02 DIAGNOSIS — I442 Atrioventricular block, complete: Secondary | ICD-10-CM | POA: Insufficient documentation

## 2014-04-02 DIAGNOSIS — T829XXA Unspecified complication of cardiac and vascular prosthetic device, implant and graft, initial encounter: Secondary | ICD-10-CM | POA: Insufficient documentation

## 2014-04-02 HISTORY — PX: OTHER SURGICAL HISTORY: SHX169

## 2014-04-07 ENCOUNTER — Encounter: Payer: Self-pay | Admitting: Internal Medicine

## 2014-04-14 ENCOUNTER — Telehealth: Payer: Self-pay | Admitting: Internal Medicine

## 2014-04-14 NOTE — Telephone Encounter (Signed)
Will need a follow up with Dr Rayann Heman in March.  Had new system put in by Dr Rosita Fire on 04/02/14

## 2014-04-14 NOTE — Telephone Encounter (Signed)
New Prob  Pt is requesting to speak to a nurse regarding next follow up visit. Please call.   ?

## 2014-04-20 ENCOUNTER — Telehealth: Payer: Self-pay | Admitting: Family Medicine

## 2014-04-20 NOTE — Telephone Encounter (Signed)
Pt has [employer] [facility] as of 04/17/14, electronic referral done. Referral# [id number], good for 6 visits until 10/19/14 Faxed info to Dr. Laurena Spies (Dr. Caprice Beaver) @ [place] for appointment today

## 2014-04-29 ENCOUNTER — Encounter: Payer: BC Managed Care – PPO | Admitting: Internal Medicine

## 2014-05-13 ENCOUNTER — Telehealth: Payer: Self-pay | Admitting: Internal Medicine

## 2014-05-13 NOTE — Telephone Encounter (Signed)
Patient tells me that this is not emergent, but uncomfortable/painful. He states that Dr. Rayann Heman told him to call and make appt with him. Due to full schedule the next few weeks, I have informed patient that I will have Melissa, EP scheduler/Kelly, RN look to see where the best place to put him on [facility] schedule. Patient verbalized understanding and agreeable to plan.

## 2014-05-13 NOTE — Telephone Encounter (Signed)
New problem   Pt's is having pain in neck and heart is skipping beats that has been going on for 1 month. Would like to speak to nurse.

## 2014-05-14 NOTE — Telephone Encounter (Signed)
Appointment 05/20/14

## 2014-05-20 ENCOUNTER — Encounter: Payer: Self-pay | Admitting: Internal Medicine

## 2014-05-20 ENCOUNTER — Ambulatory Visit (INDEPENDENT_AMBULATORY_CARE_PROVIDER_SITE_OTHER): Payer: 59 | Admitting: Internal Medicine

## 2014-05-20 VITALS — BP 148/90 | HR 87 | Ht 72.0 in | Wt 235.8 lb

## 2014-05-20 DIAGNOSIS — I441 Atrioventricular block, second degree: Secondary | ICD-10-CM

## 2014-05-20 DIAGNOSIS — I2510 Atherosclerotic heart disease of native coronary artery without angina pectoris: Secondary | ICD-10-CM

## 2014-05-20 DIAGNOSIS — R001 Bradycardia, unspecified: Secondary | ICD-10-CM

## 2014-05-20 DIAGNOSIS — I10 Essential (primary) hypertension: Secondary | ICD-10-CM

## 2014-05-20 MED ORDER — NADOLOL 20 MG PO TABS: 10.0000 mg | ORAL_TABLET | Freq: Every day | ORAL | Status: DC

## 2014-05-20 NOTE — Patient Instructions (Signed)
Your physician recommends that you schedule a follow-up appointment in: 6 weeks with Dr Rayann Heman  A chest x-ray takes a picture of the organs and structures inside the chest, including the heart, lungs, and blood vessels. This test can show several things, including, whether the heart is enlarges; whether fluid is building up in the lungs; and whether pacemaker / defibrillator leads are still in place.   Your physician has requested that you have an echocardiogram. Echocardiography is a painless test that uses sound waves to create images of your heart. It provides your doctor with information about the size and shape of your heart and how well your heart's chambers and valves are working. This procedure takes approximately one hour. There are no restrictions for this procedure.  Your physician has recommended you make the following change in your medication:  1) Stop Diltiazem 2) Start Nadolol 20mg  ---take 1/2 tablet (10 mg) daily

## 2014-05-21 ENCOUNTER — Encounter: Payer: Self-pay | Admitting: Internal Medicine

## 2014-05-21 NOTE — Progress Notes (Signed)
Electrophysiology Office Note   Date:  05/21/2014   ID:  [name] 21-May-1955, MRN [id number]  PCP:  Sallee Lange, MD   Primary Electrophysiologist: Thompson Grayer, MD    Chief Complaint  Patient presents with  . Follow-up    Mobitz (type II) AV Block     History of Present Illness: [name] is a 59 y.o. male who presents today for electrophysiology evaluation.   He returns today after his recent pacing system revision at [facility].  His atrial and ventricular leads were both removed and a new biventricular pacing system was placed due to symptoms of chest pain, SOB, and general intolerance of RV pacing.Though his wife thinks that he is better, he continues to state that he feels "bad".  He reports "sinking spells" as well as palpitations and chest discomfort.  He has not tried to be very active and therefore is not certain as to whether his exercise tolerance has improved. His pacemaker pocket is well healed.  He has recovered from his procedure.   Today, he denies symptoms of orthopnea, PND, lower extremity edema, claudication, dizziness, presyncope, syncope, bleeding, or neurologic sequela. The patient is tolerating medications without difficulties and is otherwise without complaint today.    Past Medical History  Diagnosis Date  . Hypertension   . GERD (gastroesophageal reflux disease)   . Colon polyps 2011    tubular adenoma by excisional biopsy during colonoscopy  . ADD (attention deficit disorder)     Rx-Adderall  . Degenerative disc disease, cervical   . Palpitations 2003    Holter in 2003: PACs and PVCs; negative stress nuclear test in 2005; right bundle branch block; echo in 2008-mild LVH, otherwise normal; 2008-negative stress nuclear test.  . Prostatitis     prostate calcifications by CT  . Sleep apnea     questionable diagnosis  . Pneumonia   . Anginal pain   . Complete heart block   . Coronary artery disease    Past Surgical History    Procedure Laterality Date  . Anterior cervical decomp/discectomy fusion    . Knee surgery      rt  . Colonoscopy w/ polypectomy  2011    tubular adenoma  . Esophagogastroduodenoscopy      Gastritis  . Pacemaker insertion  12/17/12    MDT Adapta L implanted by Dr Rayann Heman for mobitz II second degree AV block  . Left heart catheterization with coronary angiogram N/A 10/17/2012    Procedure: LEFT HEART CATHETERIZATION WITH CORONARY ANGIOGRAM;  Surgeon: Josue Hector, MD;  Location: [facility] CATH LAB;  Service: Cardiovascular;  Laterality: N/A;  . Percutaneous coronary stent intervention (pci-s)  10/17/2012    Procedure: PERCUTANEOUS CORONARY STENT INTERVENTION (PCI-S);  Surgeon: Josue Hector, MD;  Location: [facility] CATH LAB;  Service: Cardiovascular;;  . Left heart catheterization with coronary angiogram N/A 12/17/2012    Procedure: LEFT HEART CATHETERIZATION WITH CORONARY ANGIOGRAM;  Surgeon: Peter M Martinique, MD;  Location: [facility] CATH LAB;  Service: Cardiovascular;  Laterality: N/A;  . Permanent pacemaker insertion N/A 12/17/2012    Procedure: PERMANENT PACEMAKER INSERTION;  Surgeon: Thompson Grayer, MD;  Location: [facility] CATH LAB;  Service: Cardiovascular;  Laterality: N/A;  . Left heart catheterization with coronary angiogram N/A 06/11/2013    Procedure: LEFT HEART CATHETERIZATION WITH CORONARY ANGIOGRAM;  Surgeon: Wellington Hampshire, MD;  Location: [place] CATH LAB;  Service: Cardiovascular;  Laterality: N/A;  . Biventricular pacemaker upgrade/ system revision  04/02/14    removal of  previous atrial and ventricular leads with placement of a new MDT Consulta CRT-P system at [facility] by Dr [facility]     Current Outpatient Prescriptions  Medication Sig Dispense Refill  . aspirin 81 MG chewable tablet Chew 81 mg by mouth daily.    . clopidogrel (PLAVIX) 75 MG tablet take 1 tablet by mouth once daily 30 tablet 3  . losartan (COZAAR) 25 MG tablet Take 1 tablet (25 mg total) by mouth daily. 90 tablet 3  .  nitroGLYCERIN (NITROSTAT) 0.4 MG SL tablet Place 0.4 mg under the tongue every 5 (five) minutes as needed for chest pain (MAX 3 TABLETS).     . pantoprazole (PROTONIX) 40 MG tablet Take 1 tablet by mouth daily.    . simvastatin (ZOCOR) 10 MG tablet Take 1 tablet (10 mg total) by mouth daily at 6 PM. 30 tablet 6  . tamsulosin (FLOMAX) 0.4 MG CAPS capsule Take 2 capsules (0.8 mg total) by mouth daily. 60 capsule 6  . meloxicam (MOBIC) 7.5 MG tablet Take 7.5 mg by mouth daily as needed for pain.    . nadolol (CORGARD) 20 MG tablet Take 0.5 tablets (10 mg total) by mouth daily. 45 tablet 3   No current facility-administered medications for this visit.    Allergies:   Morphine and related and Latex   Social History:  The patient  reports that he quit smoking about 10 years ago. His smoking use included Cigarettes. He has a 100 pack-year smoking history. He has never used smokeless tobacco. He reports that he drinks about 0.6 oz of alcohol per week. He reports that he does not use illicit drugs.   Family History:  The patient's family history includes Heart disease in his mother; Hypertension in his mother.    ROS:  Please see the history of present illness.   All other systems are reviewed and negative.    PHYSICAL EXAM: VS:  BP 148/90 mmHg  Pulse 87  Ht 6' (1.829 m)  Wt 235 lb 12.8 oz (106.958 kg)  BMI 31.97 kg/m2 , BMI Body mass index is 31.97 kg/(m^2). GEN: Well nourished, well developed, in no acute distress HEENT: normal Neck: no JVD, carotid bruits, or masses Cardiac: RRR; no murmurs, rubs, or gallops,no edema  Respiratory:  clear to auscultation bilaterally, normal work of breathing GI: soft, nontender, nondistended, + BS MS: no deformity or atrophy Skin: warm and dry, device pocket is well healed Neuro:  Strength and sensation are intact Psych: euthymic mood, full affect  EKG:  EKG is ordered today. The ekg ordered today shows sinus rhythm with BiV pacing and a very nice/  narrow QRS (<130 msec)  Device interrogation is reviewed today in detail.  See PaceArt for details.   Recent Labs: 10/08/2013: ALT 28; BUN 19; Creatinine 1.1; Hemoglobin 15.1; Platelets 277.0; Potassium 4.5; Sodium 141; TSH 0.99    Lipid Panel     Component Value Date/Time   CHOL 135 12/17/2012 0215   TRIG 44 12/17/2012 0215   HDL 53 12/17/2012 0215   CHOLHDL 2.5 12/17/2012 0215   VLDL 9 12/17/2012 0215   LDLCALC 73 12/17/2012 0215     Wt Readings from Last 3 Encounters:  05/20/14 235 lb 12.8 oz (106.958 kg)  02/19/14 237 lb (107.502 kg)  02/13/14 239 lb 12.8 oz (108.773 kg)     ASSESSMENT AND PLAN:  1.  Second degree AV block with transient third degree AV block He continues to struggle with pacemaker syndrome.  His prior leads were removed and a new CRT-P system was placed.  I suspect that he is indeed much better though he continues to have symptoms.  At this point, I will obtain a PA/LAT CXR to evaluate his new lead position. I will also proceed with echo optimization of his device.    2. Palpitations As above Stop diltiazem Add nadolol 10mg  daily  3. CAD Stable No change required today  4. HTN Stable No change required today   Labs/ tests ordered today include:  Orders Placed This Encounter  Procedures  . DG Chest 2 View  . EKG 12-Lead  . 2D Echocardiogram without contrast    Follow-up: return for echo optimization of his pacemaker next week I will see again in 6 weeks  Signed, Thompson Grayer, MD  05/21/2014 9:51 PM     [place] [address] [phone] (office) [phone] (fax)

## 2014-05-25 ENCOUNTER — Encounter: Payer: Self-pay | Admitting: Internal Medicine

## 2014-05-25 ENCOUNTER — Encounter (INDEPENDENT_AMBULATORY_CARE_PROVIDER_SITE_OTHER): Payer: 59 | Admitting: *Deleted

## 2014-05-25 ENCOUNTER — Ambulatory Visit (HOSPITAL_COMMUNITY): Payer: 59 | Attending: Family Medicine | Admitting: Cardiology

## 2014-05-25 ENCOUNTER — Ambulatory Visit (INDEPENDENT_AMBULATORY_CARE_PROVIDER_SITE_OTHER)
Admission: RE | Admit: 2014-05-25 | Discharge: 2014-05-25 | Disposition: A | Discharge status: Home / Self Care | Payer: 59 | Source: Ambulatory Visit | Attending: Internal Medicine | Admitting: Internal Medicine

## 2014-05-25 DIAGNOSIS — R002 Palpitations: Secondary | ICD-10-CM | POA: Diagnosis not present

## 2014-05-25 DIAGNOSIS — I441 Atrioventricular block, second degree: Secondary | ICD-10-CM

## 2014-05-25 DIAGNOSIS — R001 Bradycardia, unspecified: Secondary | ICD-10-CM | POA: Insufficient documentation

## 2014-05-25 LAB — MDC_IDC_ENUM_SESS_TYPE_INCLINIC
Battery Remaining Longevity: 100 mo
Battery Remaining Longevity: 99 mo
Battery Voltage: 3.06 V
Battery Voltage: 3.06 V
Brady Statistic AP VP Percent: 0.32 %
Brady Statistic AP VP Percent: 0.48 %
Brady Statistic AP VS Percent: 0.01 %
Brady Statistic AP VS Percent: 0.01 %
Brady Statistic AS VP Percent: 99.51 %
Brady Statistic AS VP Percent: 99.67 %
Brady Statistic AS VS Percent: 0 %
Brady Statistic AS VS Percent: 0.01 %
Brady Statistic RA Percent Paced: 0.33 %
Brady Statistic RA Percent Paced: 0.48 %
Brady Statistic RV Percent Paced: 99.99 %
Brady Statistic RV Percent Paced: 99.99 %
Date Time Interrogation Session: 20160204042328
Date Time Interrogation Session: 20160208104322
Lead Channel Impedance Value: 1007 Ohm
Lead Channel Impedance Value: 399 Ohm
Lead Channel Impedance Value: 437 Ohm
Lead Channel Impedance Value: 437 Ohm
Lead Channel Impedance Value: 437 Ohm
Lead Channel Impedance Value: 475 Ohm
Lead Channel Impedance Value: 475 Ohm
Lead Channel Impedance Value: 475 Ohm
Lead Channel Impedance Value: 475 Ohm
Lead Channel Impedance Value: 551 Ohm
Lead Channel Impedance Value: 589 Ohm
Lead Channel Impedance Value: 627 Ohm
Lead Channel Impedance Value: 627 Ohm
Lead Channel Impedance Value: 665 Ohm
Lead Channel Impedance Value: 703 Ohm
Lead Channel Impedance Value: 817 Ohm
Lead Channel Impedance Value: 836 Ohm
Lead Channel Impedance Value: 969 Ohm
Lead Channel Pacing Threshold Amplitude: 0.5 V
Lead Channel Pacing Threshold Amplitude: 0.5 V
Lead Channel Pacing Threshold Amplitude: 0.625 V
Lead Channel Pacing Threshold Amplitude: 0.625 V
Lead Channel Pacing Threshold Amplitude: 1 V
Lead Channel Pacing Threshold Amplitude: 1.125 V
Lead Channel Pacing Threshold Pulse Width: 0.4 ms
Lead Channel Pacing Threshold Pulse Width: 0.4 ms
Lead Channel Pacing Threshold Pulse Width: 0.4 ms
Lead Channel Pacing Threshold Pulse Width: 0.4 ms
Lead Channel Pacing Threshold Pulse Width: 0.4 ms
Lead Channel Pacing Threshold Pulse Width: 0.4 ms
Lead Channel Sensing Intrinsic Amplitude: 1.375 mV
Lead Channel Sensing Intrinsic Amplitude: 4.25 mV
Lead Channel Setting Pacing Amplitude: 2 V
Lead Channel Setting Pacing Amplitude: 2 V
Lead Channel Setting Pacing Amplitude: 2.25 V
Lead Channel Setting Pacing Amplitude: 2.25 V
Lead Channel Setting Pacing Amplitude: 2.5 V
Lead Channel Setting Pacing Amplitude: 2.5 V
Lead Channel Setting Pacing Pulse Width: 0.4 ms
Lead Channel Setting Pacing Pulse Width: 0.4 ms
Lead Channel Setting Pacing Pulse Width: 0.4 ms
Lead Channel Setting Pacing Pulse Width: 0.4 ms
Lead Channel Setting Sensing Sensitivity: 0.9 mV
Lead Channel Setting Sensing Sensitivity: 0.9 mV
Zone Setting Detection Interval: 350 ms
Zone Setting Detection Interval: 350 ms
Zone Setting Detection Interval: 400 ms
Zone Setting Detection Interval: 400 ms

## 2014-05-25 NOTE — Progress Notes (Signed)
Echo performed. 

## 2014-05-30 ENCOUNTER — Other Ambulatory Visit: Payer: Self-pay | Admitting: Family Medicine

## 2014-06-03 ENCOUNTER — Telehealth: Payer: Self-pay | Admitting: *Deleted

## 2014-06-03 NOTE — Telephone Encounter (Signed)
Called Friday 05/29/14- LMOM to call back to talk about how he was feeling since device changes made 05/25/14. No returned call. No answer at home number [phone], called emergency contact and [facility] regarding unreturned call and to call back to device clinic.

## 2014-06-08 ENCOUNTER — Telehealth: Payer: Self-pay | Admitting: *Deleted

## 2014-06-08 NOTE — Telephone Encounter (Signed)
Reached Mariann Laster Essick, patient's wife. Mr. Gertsch was not available. Mrs. Ripple reports that he tried to call back but was not able to wait on hold r/t work. She reports that she hasn't noticed much change since AVopt echo 2/8. He is still experiencing coughing, breathlessness and "falling feeling" at 6pm every night and occasional chest soreness after episode (06/06/14 6pm episode mentioned as being worse than others). 05/20/13 capture management and other automatic features turned off r/t this issue. Confirmed 06/29/14 appt with Dr. Rayann Heman and instructed to call back if Mr. Clipper would like to be seen in office earlier.

## 2014-06-09 ENCOUNTER — Ambulatory Visit (INDEPENDENT_AMBULATORY_CARE_PROVIDER_SITE_OTHER): Payer: 59 | Admitting: *Deleted

## 2014-06-09 DIAGNOSIS — Z950 Presence of cardiac pacemaker: Secondary | ICD-10-CM

## 2014-06-09 LAB — MDC_IDC_ENUM_SESS_TYPE_INCLINIC
Battery Remaining Longevity: 101 mo
Battery Voltage: 3.06 V
Brady Statistic AP VP Percent: 0.37 %
Brady Statistic AP VS Percent: 0.01 %
Brady Statistic AS VP Percent: 99.61 %
Brady Statistic AS VS Percent: 0.01 %
Brady Statistic RA Percent Paced: 0.38 %
Brady Statistic RV Percent Paced: 99.98 %
Date Time Interrogation Session: 20160223120913
Lead Channel Impedance Value: 1026 Ohm
Lead Channel Impedance Value: 437 Ohm
Lead Channel Impedance Value: 475 Ohm
Lead Channel Impedance Value: 475 Ohm
Lead Channel Impedance Value: 494 Ohm
Lead Channel Impedance Value: 665 Ohm
Lead Channel Impedance Value: 684 Ohm
Lead Channel Impedance Value: 741 Ohm
Lead Channel Impedance Value: 931 Ohm
Lead Channel Pacing Threshold Amplitude: 0.75 V
Lead Channel Pacing Threshold Amplitude: 0.75 V
Lead Channel Pacing Threshold Amplitude: 1 V
Lead Channel Pacing Threshold Pulse Width: 0.4 ms
Lead Channel Pacing Threshold Pulse Width: 0.4 ms
Lead Channel Pacing Threshold Pulse Width: 0.4 ms
Lead Channel Sensing Intrinsic Amplitude: 3.375 mV
Lead Channel Sensing Intrinsic Amplitude: 3.375 mV
Lead Channel Setting Pacing Amplitude: 2 V
Lead Channel Setting Pacing Amplitude: 2.25 V
Lead Channel Setting Pacing Amplitude: 2.5 V
Lead Channel Setting Pacing Pulse Width: 0.4 ms
Lead Channel Setting Pacing Pulse Width: 0.4 ms
Lead Channel Setting Sensing Sensitivity: 0.9 mV
Zone Setting Detection Interval: 350 ms
Zone Setting Detection Interval: 400 ms

## 2014-06-09 NOTE — Progress Notes (Signed)
Pt symptomatic during auto capture testing. Changed all three leads from monitored capture management to off. Set device to correct time (device was offset by 7 hours). Changed max track and upper sensor limit both from 135 to 145bpm. Follow up as planned--ROV w/ Dr. Rayann Heman 06/29/14.

## 2014-06-15 ENCOUNTER — Telehealth: Payer: Self-pay | Admitting: *Deleted

## 2014-06-15 ENCOUNTER — Encounter: Payer: Self-pay | Admitting: Gastroenterology

## 2014-06-15 NOTE — Telephone Encounter (Signed)
LMOM to call back if 6pm episodes were not resolved after CRTP programming changes made 2/23. ROV with JA 06/29/14.

## 2014-06-28 ENCOUNTER — Other Ambulatory Visit: Payer: Self-pay | Admitting: Internal Medicine

## 2014-06-29 ENCOUNTER — Encounter: Payer: 59 | Admitting: Internal Medicine

## 2014-06-30 ENCOUNTER — Encounter: Payer: Self-pay | Admitting: Internal Medicine

## 2014-07-10 ENCOUNTER — Other Ambulatory Visit: Payer: Self-pay | Admitting: Cardiovascular Disease

## 2014-07-10 MED ORDER — LOSARTAN POTASSIUM 25 MG PO TABS: 25.0000 mg | ORAL_TABLET | Freq: Every day | ORAL | Status: DC

## 2014-07-10 NOTE — Telephone Encounter (Signed)
Received fax refill request  Rx # [phone] Medication:  Losartan Potassium 25 mg tab Qty 90 Sig:  Take one tablet by mouth once daily Physician:  Bronson Ing

## 2014-07-10 NOTE — Telephone Encounter (Signed)
escribed to pharmacy,MD is Allred

## 2014-07-25 ENCOUNTER — Other Ambulatory Visit: Payer: Self-pay | Admitting: Internal Medicine

## 2014-07-27 ENCOUNTER — Encounter: Payer: Self-pay | Admitting: Internal Medicine

## 2014-07-27 ENCOUNTER — Ambulatory Visit (INDEPENDENT_AMBULATORY_CARE_PROVIDER_SITE_OTHER): Payer: 59 | Admitting: Internal Medicine

## 2014-07-27 ENCOUNTER — Other Ambulatory Visit: Payer: Self-pay

## 2014-07-27 VITALS — BP 178/86 | HR 86 | Ht 72.0 in | Wt 230.6 lb

## 2014-07-27 DIAGNOSIS — I441 Atrioventricular block, second degree: Secondary | ICD-10-CM | POA: Diagnosis not present

## 2014-07-27 DIAGNOSIS — R001 Bradycardia, unspecified: Secondary | ICD-10-CM

## 2014-07-27 DIAGNOSIS — I10 Essential (primary) hypertension: Secondary | ICD-10-CM

## 2014-07-27 LAB — MDC_IDC_ENUM_SESS_TYPE_INCLINIC
Battery Remaining Longevity: 103 mo
Battery Voltage: 3.04 V
Brady Statistic AP VP Percent: 0.19 %
Brady Statistic AP VS Percent: 0 %
Brady Statistic AS VP Percent: 99.81 %
Brady Statistic AS VS Percent: 0.01 %
Brady Statistic RA Percent Paced: 0.19 %
Brady Statistic RV Percent Paced: 99.99 %
Date Time Interrogation Session: 20160411133122
Lead Channel Impedance Value: 399 Ohm
Lead Channel Impedance Value: 418 Ohm
Lead Channel Impedance Value: 456 Ohm
Lead Channel Impedance Value: 513 Ohm
Lead Channel Impedance Value: 532 Ohm
Lead Channel Impedance Value: 589 Ohm
Lead Channel Impedance Value: 627 Ohm
Lead Channel Impedance Value: 741 Ohm
Lead Channel Impedance Value: 931 Ohm
Lead Channel Pacing Threshold Amplitude: 0.75 V
Lead Channel Pacing Threshold Amplitude: 0.75 V
Lead Channel Pacing Threshold Amplitude: 1 V
Lead Channel Pacing Threshold Pulse Width: 0.4 ms
Lead Channel Pacing Threshold Pulse Width: 0.4 ms
Lead Channel Pacing Threshold Pulse Width: 0.4 ms
Lead Channel Sensing Intrinsic Amplitude: 2.5 mV
Lead Channel Setting Pacing Amplitude: 2 V
Lead Channel Setting Pacing Amplitude: 2.25 V
Lead Channel Setting Pacing Amplitude: 2.5 V
Lead Channel Setting Pacing Pulse Width: 0.4 ms
Lead Channel Setting Pacing Pulse Width: 0.4 ms
Lead Channel Setting Sensing Sensitivity: 0.9 mV
Zone Setting Detection Interval: 350 ms
Zone Setting Detection Interval: 400 ms

## 2014-07-27 MED ORDER — LOSARTAN POTASSIUM 50 MG PO TABS: 50.0000 mg | ORAL_TABLET | Freq: Every day | ORAL | Status: DC

## 2014-07-27 NOTE — Patient Instructions (Signed)
Your physician wants you to follow-up in: 6 months in the device clinic with Debroah Loop, RN and 12 months with Chanetta Marshall, NP You will receive a reminder letter in the mail two months in advance. If you don't receive a letter, please call our office to schedule the follow-up appointment.  Remote monitoring is used to monitor your Pacemaker or ICD from home. This monitoring reduces the number of office visits required to check your device to one time per year. It allows [place] to keep an eye on the functioning of your device to ensure it is working properly. You are scheduled for a device check from home on 10/26/14. You may send your transmission at any time that day. If you have a wireless device, the transmission will be sent automatically. After your physician reviews your transmission, you will receive a postcard with your next transmission date.   Your physician recommends that you schedule a follow-up appointment in: 4 weeks with Dr Bronson Ing   Your physician has recommended you make the following change in your medication:  1) Increase Losartan to 50 mg daily

## 2014-07-27 NOTE — Telephone Encounter (Signed)
Ok to refill or should this be deferred to Dr Raylene Everts office? Please advise. Thanks, MI

## 2014-07-27 NOTE — Telephone Encounter (Signed)
Dr Raliegh Ip should fill

## 2014-07-27 NOTE — Progress Notes (Signed)
Electrophysiology Office Note   Date:  07/27/2014   ID:  [name], DOB 08/09/1955, MRN [id number]  PCP:  Sallee Lange, MD  Cardiologist:  Dr Bronson Ing Primary Electrophysiologist: Thompson Grayer, MD    Chief Complaint  Patient presents with  . Bradycardia     History of Present Illness: [name] is a 59 y.o. male who presents today for electrophysiology evaluation.   Doing "much better".  Energy and exercise tolerance are improved.  Rare palpitations.  Fatigue when heart rate is 50.  Today, he denies symptoms of palpitations, chest pain, shortness of breath, orthopnea, PND, lower extremity edema, claudication, dizziness, presyncope, syncope, bleeding, or neurologic sequela. The patient is tolerating medications without difficulties and is otherwise without complaint today.    Past Medical History  Diagnosis Date  . Hypertension   . GERD (gastroesophageal reflux disease)   . Colon polyps 2011    tubular adenoma by excisional biopsy during colonoscopy  . ADD (attention deficit disorder)     Rx-Adderall  . Degenerative disc disease, cervical   . Palpitations 2003    Holter in 2003: PACs and PVCs; negative stress nuclear test in 2005; right bundle branch block; echo in 2008-mild LVH, otherwise normal; 2008-negative stress nuclear test.  . Prostatitis     prostate calcifications by CT  . Sleep apnea     questionable diagnosis  . Pneumonia   . Anginal pain   . Complete heart block   . Coronary artery disease   . AV block, 3rd degree    Past Surgical History  Procedure Laterality Date  . Anterior cervical decomp/discectomy fusion    . Knee surgery      rt  . Colonoscopy w/ polypectomy  2011    tubular adenoma  . Esophagogastroduodenoscopy      Gastritis  . Pacemaker insertion  12/17/12    MDT Adapta L implanted by Dr Rayann Heman for mobitz II second degree AV block  . Left heart catheterization with coronary angiogram N/A 10/17/2012    Procedure: LEFT HEART  CATHETERIZATION WITH CORONARY ANGIOGRAM;  Surgeon: Josue Hector, MD;  Location: [facility] CATH LAB;  Service: Cardiovascular;  Laterality: N/A;  . Percutaneous coronary stent intervention (pci-s)  10/17/2012    Procedure: PERCUTANEOUS CORONARY STENT INTERVENTION (PCI-S);  Surgeon: Josue Hector, MD;  Location: [facility] CATH LAB;  Service: Cardiovascular;;  . Left heart catheterization with coronary angiogram N/A 12/17/2012    Procedure: LEFT HEART CATHETERIZATION WITH CORONARY ANGIOGRAM;  Surgeon: Peter M Martinique, MD;  Location: [facility] CATH LAB;  Service: Cardiovascular;  Laterality: N/A;  . Permanent pacemaker insertion N/A 12/17/2012    Procedure: PERMANENT PACEMAKER INSERTION;  Surgeon: Thompson Grayer, MD;  Location: [facility] CATH LAB;  Service: Cardiovascular;  Laterality: N/A;  . Left heart catheterization with coronary angiogram N/A 06/11/2013    Procedure: LEFT HEART CATHETERIZATION WITH CORONARY ANGIOGRAM;  Surgeon: Wellington Hampshire, MD;  Location: [place] CATH LAB;  Service: Cardiovascular;  Laterality: N/A;  . Biventricular pacemaker upgrade/ system revision  04/02/14    removal of previous atrial and ventricular leads with placement of a new MDT Consulta CRT-P system at [facility] by Dr [facility]     Current Outpatient Prescriptions  Medication Sig Dispense Refill  . aspirin 81 MG chewable tablet Chew 81 mg by mouth daily.    . clopidogrel (PLAVIX) 75 MG tablet take 1 tablet by mouth once daily 30 tablet 0  . diltiazem (CARDIZEM CD) 240 MG 24 hr capsule Take 240  mg by mouth daily.   0  . HYDROcodone-acetaminophen (NORCO/VICODIN) 5-325 MG per tablet Take 1 tablet by mouth every 6 (six) hours as needed for moderate pain.    Marland Kitchen losartan (COZAAR) 25 MG tablet Take 1 tablet (25 mg total) by mouth daily. 90 tablet 3  . meloxicam (MOBIC) 7.5 MG tablet Take 7.5 mg by mouth daily as needed for pain.    . nadolol (CORGARD) 20 MG tablet Take 0.5 tablets (10 mg total) by mouth daily. 45 tablet 3  . nitroGLYCERIN  (NITROSTAT) 0.4 MG SL tablet Place 0.4 mg under the tongue every 5 (five) minutes as needed for chest pain (MAX 3 TABLETS).     . pantoprazole (PROTONIX) 40 MG tablet take 1 tablet by mouth once daily 30 tablet 2  . simvastatin (ZOCOR) 10 MG tablet Take 1 tablet (10 mg total) by mouth daily at 6 PM. 30 tablet 6  . tamsulosin (FLOMAX) 0.4 MG CAPS capsule take 2 capsules once daily (Patient taking differently: take 2 capsules by mouth once daily) 60 capsule 2   No current facility-administered medications for this visit.    Allergies:   Morphine and related and Latex   Social History:  The patient  reports that he quit smoking about 10 years ago. His smoking use included Cigarettes. He has a 100 pack-year smoking history. He has quit using smokeless tobacco. He reports that he drinks about 0.6 oz of alcohol per week. He reports that he does not use illicit drugs.   Family History:  The patient's family history includes Heart disease in his mother; Hypertension in his mother.    ROS:  Please see the history of present illness.   All other systems are reviewed and negative.    PHYSICAL EXAM: VS:  BP 178/86 mmHg  Pulse 86  Ht 6' (1.829 m)  Wt 230 lb 9.6 oz (104.599 kg)  BMI 31.27 kg/m2 , BMI Body mass index is 31.27 kg/(m^2). GEN: Well nourished, well developed, in no acute distress HEENT: normal Neck: no JVD, carotid bruits, or masses Cardiac: RRR; no murmurs, rubs, or gallops,no edema  Respiratory:  clear to auscultation bilaterally, normal work of breathing GI: soft, nontender, nondistended, + BS MS: no deformity or atrophy Skin: warm and dry, device pocket is well healed Neuro:  Strength and sensation are intact Psych: euthymic mood, full affect  Device interrogation is reviewed today in detail.  See PaceArt for details.   Recent Labs: 10/08/2013: ALT 28; BUN 19; Creatinine 1.1; Hemoglobin 15.1; Platelets 277.0; Potassium 4.5; Sodium 141; TSH 0.99    Lipid Panel       Component Value Date/Time   CHOL 135 12/17/2012 0215   TRIG 44 12/17/2012 0215   HDL 53 12/17/2012 0215   CHOLHDL 2.5 12/17/2012 0215   VLDL 9 12/17/2012 0215   LDLCALC 73 12/17/2012 0215     Wt Readings from Last 3 Encounters:  07/27/14 230 lb 9.6 oz (104.599 kg)  05/20/14 235 lb 12.8 oz (106.958 kg)  02/19/14 237 lb (107.502 kg)     ASSESSMENT AND PLAN:  1.  Mobitz II second degree AV block with occasional CHB Normal pacemaker function See Pace Art report Lower rate increased from 50-60 bpm today,   No other changes  2. htn bp is quite elevated Increase losartan to 50mg  daily Follow-up with Dr Bronson Ing in 4 weeks  Current medicines are reviewed at length with the patient today.   The patient does not have concerns regarding his  medicines.  The following changes were made today:  none  Follow-up: return to see Terrence Dupont in the device clinic in 6 months, I will see in a year, remote monitoring  Signed, Thompson Grayer, MD  07/27/2014 12:11 PM     [place] [id number] [phone] (office) [phone] (fax)

## 2014-08-05 ENCOUNTER — Ambulatory Visit: Payer: 59 | Admitting: Physician Assistant

## 2014-08-05 ENCOUNTER — Ambulatory Visit (INDEPENDENT_AMBULATORY_CARE_PROVIDER_SITE_OTHER): Payer: 59 | Admitting: Physician Assistant

## 2014-08-05 ENCOUNTER — Encounter: Payer: Self-pay | Admitting: Physician Assistant

## 2014-08-05 VITALS — BP 174/90 | HR 72 | Ht 72.0 in | Wt 231.8 lb

## 2014-08-05 DIAGNOSIS — I10 Essential (primary) hypertension: Secondary | ICD-10-CM | POA: Diagnosis not present

## 2014-08-05 DIAGNOSIS — R002 Palpitations: Secondary | ICD-10-CM | POA: Diagnosis not present

## 2014-08-05 DIAGNOSIS — I25118 Atherosclerotic heart disease of native coronary artery with other forms of angina pectoris: Secondary | ICD-10-CM | POA: Diagnosis not present

## 2014-08-05 MED ORDER — LOSARTAN POTASSIUM 100 MG PO TABS: 100.0000 mg | ORAL_TABLET | Freq: Every day | ORAL | Status: DC

## 2014-08-05 MED ORDER — HYDROCHLOROTHIAZIDE 25 MG PO TABS: 25.0000 mg | ORAL_TABLET | Freq: Every day | ORAL | Status: DC

## 2014-08-05 NOTE — Progress Notes (Signed)
Cardiology Office Note   Date:  08/05/2014   ID:  [name], [name] 1955-04-22, MRN [id number]   PCP:  Sallee Lange, MD    Cardiologist:  Dr Bronson Ing Primary Electrophysiologist: Thompson Grayer, MD           Chief Complaint: Elevated blood pressure headache    History of Present Illness: [name] is a 59 y.o. male who presents for elevated blood pressure. His blood pressure was elevated in March 2015 at which time losartan 25 mg was started. He saw Dr. Rayann Heman 07/27/14 and blood pressure was in losartan was increased to 50 mg daily. Patient's blood pressure continues to run 983 to 382N systolic. He feels terrible. He has headaches and complains of blurred vision at times. He also continues to have chronic chest pain with exertion ever since his pacemaker was placed. Having the second pacemaker placed at [facility] has not helped. He does eat a high sodium diet and has been instructed to significantly cut back on his sodium intake.  Patient has a history of coronary artery disease status post PCI of the RCA July 2014 patent and follow-up cath 05/2013, Mobitz 2 AV block status post pacemaker here and at [facility]. Last 2-D echo 05/2014 EF 05-39% with diastolic dysfunction.    Past Medical History  Diagnosis Date  . Hypertension   . GERD (gastroesophageal reflux disease)   . Colon polyps 2011    tubular adenoma by excisional biopsy during colonoscopy  . ADD (attention deficit disorder)     Rx-Adderall  . Degenerative disc disease, cervical   . Palpitations 2003    Holter in 2003: PACs and PVCs; negative stress nuclear test in 2005; right bundle branch block; echo in 2008-mild LVH, otherwise normal; 2008-negative stress nuclear test.  . Prostatitis     prostate calcifications by CT  . Sleep apnea     questionable diagnosis  . Pneumonia   . Anginal pain   . Complete heart block   . Coronary artery disease   . AV block, 3rd degree     Past Surgical History   Procedure Laterality Date  . Anterior cervical decomp/discectomy fusion    . Knee surgery      rt  . Colonoscopy w/ polypectomy  2011    tubular adenoma  . Esophagogastroduodenoscopy      Gastritis  . Pacemaker insertion  12/17/12    MDT Adapta L implanted by Dr Rayann Heman for mobitz II second degree AV block  . Left heart catheterization with coronary angiogram N/A 10/17/2012    Procedure: LEFT HEART CATHETERIZATION WITH CORONARY ANGIOGRAM;  Surgeon: Josue Hector, MD;  Location: [facility] CATH LAB;  Service: Cardiovascular;  Laterality: N/A;  . Percutaneous coronary stent intervention (pci-s)  10/17/2012    Procedure: PERCUTANEOUS CORONARY STENT INTERVENTION (PCI-S);  Surgeon: Josue Hector, MD;  Location: [facility] CATH LAB;  Service: Cardiovascular;;  . Left heart catheterization with coronary angiogram N/A 12/17/2012    Procedure: LEFT HEART CATHETERIZATION WITH CORONARY ANGIOGRAM;  Surgeon: Peter M Martinique, MD;  Location: [facility] CATH LAB;  Service: Cardiovascular;  Laterality: N/A;  . Permanent pacemaker insertion N/A 12/17/2012    Procedure: PERMANENT PACEMAKER INSERTION;  Surgeon: Thompson Grayer, MD;  Location: [facility] CATH LAB;  Service: Cardiovascular;  Laterality: N/A;  . Left heart catheterization with coronary angiogram N/A 06/11/2013    Procedure: LEFT HEART CATHETERIZATION WITH CORONARY ANGIOGRAM;  Surgeon: Wellington Hampshire, MD;  Location: [place] CATH LAB;  Service: Cardiovascular;  Laterality: N/A;  .  Biventricular pacemaker upgrade/ system revision  04/02/14    removal of previous atrial and ventricular leads with placement of a new MDT Consulta CRT-P system at [facility] by Dr [facility]     Current Outpatient Prescriptions  Medication Sig Dispense Refill  . aspirin 81 MG chewable tablet Chew 81 mg by mouth daily.    . clopidogrel (PLAVIX) 75 MG tablet take 1 tablet by mouth once daily 30 tablet 0  . diltiazem (CARDIZEM CD) 240 MG 24 hr capsule Take 240 mg by mouth daily.   0  .  HYDROcodone-acetaminophen (NORCO/VICODIN) 5-325 MG per tablet Take 1 tablet by mouth every 6 (six) hours as needed for moderate pain.    Marland Kitchen losartan (COZAAR) 50 MG tablet Take 1 tablet (50 mg total) by mouth daily. 90 tablet 3  . meloxicam (MOBIC) 7.5 MG tablet Take 7.5 mg by mouth daily as needed for pain.    . nadolol (CORGARD) 20 MG tablet Take 0.5 tablets (10 mg total) by mouth daily. 45 tablet 3  . nitroGLYCERIN (NITROSTAT) 0.4 MG SL tablet Place 0.4 mg under the tongue every 5 (five) minutes as needed for chest pain (MAX 3 TABLETS).     . pantoprazole (PROTONIX) 40 MG tablet take 1 tablet by mouth once daily 30 tablet 2  . simvastatin (ZOCOR) 10 MG tablet take 1 tablet by mouth every evening AT 6PM 30 tablet 6  . tamsulosin (FLOMAX) 0.4 MG CAPS capsule take 2 capsules once daily (Patient taking differently: take 2 capsules by mouth once daily) 60 capsule 2   No current facility-administered medications for this visit.    Allergies:   Morphine and related and Latex    Social History:  The patient  reports that he quit smoking about 10 years ago. His smoking use included Cigarettes. He started smoking about 47 years ago. He has a 100 pack-year smoking history. He has quit using smokeless tobacco. He reports that he drinks about 0.6 oz of alcohol per week. He reports that he does not use illicit drugs.   Family History:  The patient's family history includes Heart disease in his mother; Hypertension in his mother.    ROS:  Please see the history of present illness.   Otherwise, review of systems are positive for none.   All other systems are reviewed and negative.    PHYSICAL EXAM: VS:  BP 174/90 mmHg  Pulse 72  Ht 6' (1.829 m)  Wt 231 lb 12.8 oz (105.144 kg)  BMI 31.43 kg/m2 , BMI Body mass index is 31.43 kg/(m^2). GEN: Well nourished, well developed, in no acute distress Neck: no JVD, HJR, carotid bruits, or masses Cardiac:RRR; 2/6 systolic murmur LSB,nogallop, rubs, thrill or  heave,  Respiratory:  clear to auscultation bilaterally, normal work of breathing GI: soft, nontender, nondistended, + BS MS: no deformity or atrophy Extremities: without cyanosis, clubbing, edema, good distal pulses bilaterally.  Skin: warm and dry, no rash Neuro:  Strength and sensation are intact    EKG:  EKG is ordered today. The ekg ordered today demonstrates ventricular paced   Recent Labs: 10/08/2013: ALT 28; BUN 19; Creatinine 1.1; Hemoglobin 15.1; Platelets 277.0; Potassium 4.5; Sodium 141; TSH 0.99    Lipid Panel    Component Value Date/Time   CHOL 135 12/17/2012 0215   TRIG 44 12/17/2012 0215   HDL 53 12/17/2012 0215   CHOLHDL 2.5 12/17/2012 0215   VLDL 9 12/17/2012 0215   LDLCALC 73 12/17/2012 0215  Wt Readings from Last 3 Encounters:  08/05/14 231 lb 12.8 oz (105.144 kg)  07/27/14 230 lb 9.6 oz (104.599 kg)  05/20/14 235 lb 12.8 oz (106.958 kg)      Other studies Reviewed: Additional studies/ records that were reviewed today include and review of the records demonstrates:  Cardiac catheterization 2015 Final Conclusions:   1. Significant 1 vessel coronary artery disease with patent RCA stent and overall mild to moderate disease in the LAD with no flow-limiting lesions. 2. Mildly elevated left ventricular end-diastolic pressure with moderate hypertension. 3. Normal LV systolic function.  Recommendations:  Continue medical therapy. His symptoms do not seem to be due to obstructive coronary artery disease. His blood pressure is elevated with elevated left ventricular end-diastolic pressure. He probably has some degree of diastolic dysfunction. Recommend blood pressure control and testing for sleep apnea if there is clinical indication. Pacemaker management per Dr. Rayann Heman.  Kathlyn Sacramento MD, [facility] 06/11/2013, 12:00 PM  2-D echo 05/2014 Study Conclusions  - Left ventricle: The cavity size was normal. There was mild focal   basal hypertrophy of the  septum. Systolic function was normal.   The estimated ejection fraction was in the range of 50% to 55%.   Images were inadequate for LV wall motion assessment. There was   an increased relative contribution of atrial contraction to   ventricular filling. Doppler parameters are consistent with   abnormal left ventricular relaxation (grade 1 diastolic   dysfunction). - Ventricular septum: Septal motion showed paradox. These changes   are consistent with right ventricular pacing. - Aortic valve: Poorly visualized. Mildly calcified annulus. A   bicuspid morphology cannot be excluded; mildly thickened, mildly   calcified leaflets. - Aorta: Aortic root dimension: 39 mm (ED). Ascending aortic   diameter: 38 mm (S). - Ascending aorta: The ascending aorta was mildly dilated.      ASSESSMENT AND PLAN: Hypertension Blood pressure is still significantly elevated and he feels terrible. Will increase losartan to 100 mg daily and add HCTZ 25 mg once daily. He is to come back for blood pressure check in one week and bmet in 2 weeks. Follow-up with Dr. Bronson Ing in 2 weeks. Recommend follow-up with ophthalmologist. Patient instructed on 2 g sodium diet.   Coronary atherosclerosis of native coronary artery Repeat cath showed patent RCA and follow-up cath in 05/2013. Patient continues to have exertional chest pain since pacer insertion. No improvement with second pacer placed at [place], Ermalinda Barrios, PA-C  08/05/2014 1:36 PM    [facility] [address], [place], [place]  [address] Phone: [phone]; Fax: [phone]

## 2014-08-05 NOTE — Assessment & Plan Note (Signed)
Repeat cath showed patent RCA and follow-up cath in 05/2013. Patient continues to have exertional chest pain since pacer insertion. No improvement with second pacer placed at [facility].

## 2014-08-05 NOTE — Assessment & Plan Note (Addendum)
Blood pressure is still significantly elevated and he feels terrible. Will increase losartan to 100 mg daily and add HCTZ 25 mg once daily. He is to come back for blood pressure check in one week and bmet in 2 weeks. Follow-up with Dr. Bronson Ing in 2 weeks. Recommend follow-up with ophthalmologist. Patient instructed on 2 g sodium diet.

## 2014-08-05 NOTE — Patient Instructions (Addendum)
Your physician recommends that you schedule a follow-up appointment in: 1 week for nurse apt for BP check  2 week follow up with Dr.Koneswaran   START HCTZ 25 mg daily  INCREASE Losartan to 100 mg daily   Keep BP log and bring back at nurse visit   Lab work in 2 weeks (BMET)     Thank you for choosing [facility] !      Marland Kitchen

## 2014-08-07 ENCOUNTER — Telehealth: Payer: Self-pay | Admitting: Adult Health

## 2014-08-07 NOTE — Telephone Encounter (Signed)
Patient would like to speak with nurse in regards to elevated BP's / tgs

## 2014-08-07 NOTE — Telephone Encounter (Signed)
Pt has only been on med change for 2 days and is taking BP every 1 1/2 hrs.I encouraged him to take am,lunch,and dinner He will fu in 1 week for recheck

## 2014-08-12 ENCOUNTER — Ambulatory Visit (INDEPENDENT_AMBULATORY_CARE_PROVIDER_SITE_OTHER): Payer: 59 | Admitting: *Deleted

## 2014-08-12 VITALS — BP 132/80 | HR 68 | Ht 72.0 in | Wt 227.2 lb

## 2014-08-12 DIAGNOSIS — I10 Essential (primary) hypertension: Secondary | ICD-10-CM | POA: Diagnosis not present

## 2014-08-12 NOTE — Patient Instructions (Signed)
Your physician recommends that you schedule a follow-up appointment in:  As scheduled  Thank you for choosing [facility]!

## 2014-08-12 NOTE — Progress Notes (Signed)
Patient reports he is taking meds as directed. Denies any complaints at this time. States that he feels better.

## 2014-08-18 ENCOUNTER — Other Ambulatory Visit: Payer: Self-pay | Admitting: Internal Medicine

## 2014-08-18 ENCOUNTER — Ambulatory Visit: Payer: 59 | Admitting: Cardiovascular Disease

## 2014-08-20 LAB — BASIC METABOLIC PANEL
BUN: 13 mg/dL (ref 6–23)
CO2: 28 mEq/L (ref 19–32)
Calcium: 9.1 mg/dL (ref 8.4–10.5)
Chloride: 101 mEq/L (ref 96–112)
Creat: 0.89 mg/dL (ref 0.50–1.35)
Glucose, Bld: 113 mg/dL — ABNORMAL HIGH (ref 70–99)
Potassium: 3.9 mEq/L (ref 3.5–5.3)
Sodium: 139 mEq/L (ref 135–145)

## 2014-08-21 ENCOUNTER — Encounter: Payer: Self-pay | Admitting: Cardiovascular Disease

## 2014-08-21 ENCOUNTER — Ambulatory Visit (INDEPENDENT_AMBULATORY_CARE_PROVIDER_SITE_OTHER): Payer: 59 | Admitting: Cardiovascular Disease

## 2014-08-21 VITALS — BP 130/70 | HR 93 | Ht 72.0 in | Wt 230.0 lb

## 2014-08-21 DIAGNOSIS — Z950 Presence of cardiac pacemaker: Secondary | ICD-10-CM

## 2014-08-21 DIAGNOSIS — I25118 Atherosclerotic heart disease of native coronary artery with other forms of angina pectoris: Secondary | ICD-10-CM | POA: Diagnosis not present

## 2014-08-21 DIAGNOSIS — R002 Palpitations: Secondary | ICD-10-CM

## 2014-08-21 DIAGNOSIS — F419 Anxiety disorder, unspecified: Secondary | ICD-10-CM

## 2014-08-21 DIAGNOSIS — I10 Essential (primary) hypertension: Secondary | ICD-10-CM

## 2014-08-21 DIAGNOSIS — R5383 Other fatigue: Secondary | ICD-10-CM

## 2014-08-21 DIAGNOSIS — R079 Chest pain, unspecified: Secondary | ICD-10-CM

## 2014-08-21 DIAGNOSIS — I441 Atrioventricular block, second degree: Secondary | ICD-10-CM

## 2014-08-21 DIAGNOSIS — Z955 Presence of coronary angioplasty implant and graft: Secondary | ICD-10-CM

## 2014-08-21 NOTE — Patient Instructions (Signed)
Your physician recommends that you schedule a follow-up appointment in: August with Dr. Candee Furbish  Your physician has recommended you make the following change in your medication:  Hydrochlorothiazide   Thank you for choosing [facility]!

## 2014-08-21 NOTE — Progress Notes (Signed)
Patient ID: [name], male   DOB: 10/01/1955, 59 y.o.   MRN: [id number]      SUBJECTIVE: The patient presents for routine follow-up of coronary artery disease. He had been recently experiencing uncontrolled hypertension and is now on losartan 100 mg and hydrochlorthiazide 25 mg. Pacemaker interrogation on 4/11 demonstrated normal device function with no mode switches and 2 episodes of high ventricular rates with the longest lasting 8 beats. Biventricular pacing is occurring greater than 99%. The lower rate was increased from 50-60 bpm.  He said he is feeling awful and began feeling awful approximately 3-5 days after losartan was increased and hydrochlorthiazide was started. He no longer has headaches and blurred vision but complains of palpitations, chest pain, and presyncope. He is here with his wife. He has been avoiding salt. He has generalized weakness. He says he felt better when his blood pressure was higher.  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  2/4: Diltiazem d/c, nadolol 10 mg started.  4/11: High BP. Losartan increased to 50 mg.  4/20: High BP. Losartan increased to 100 mg and HCTZ 25 mg started.   Review of Systems: As per "subjective", otherwise negative.  Allergies  Allergen Reactions  . Morphine And Related Itching and Rash    redness  . Latex Rash    Current Outpatient Prescriptions  Medication Sig Dispense Refill  . aspirin 81 MG chewable tablet Chew 81 mg by mouth daily.    . clopidogrel (PLAVIX) 75 MG tablet take 1 tablet by mouth once daily 30 tablet 6  . diltiazem (CARDIZEM CD) 240 MG 24 hr capsule Take 240 mg by mouth daily.   0  . hydrochlorothiazide (HYDRODIURIL) 25 MG tablet Take 1 tablet (25 mg total) by mouth daily. 90 tablet 3  . HYDROcodone-acetaminophen (NORCO/VICODIN) 5-325 MG per tablet Take 1 tablet by mouth every 6 (six) hours as needed for moderate pain.    Marland Kitchen losartan (COZAAR) 100 MG tablet Take 1 tablet (100 mg total) by mouth daily.  90 tablet 3  . meloxicam (MOBIC) 7.5 MG tablet Take 7.5 mg by mouth daily as needed for pain.    . nadolol (CORGARD) 20 MG tablet Take 0.5 tablets (10 mg total) by mouth daily. 45 tablet 3  . nitroGLYCERIN (NITROSTAT) 0.4 MG SL tablet Place 0.4 mg under the tongue every 5 (five) minutes as needed for chest pain (MAX 3 TABLETS).     . pantoprazole (PROTONIX) 40 MG tablet take 1 tablet by mouth once daily 30 tablet 2  . simvastatin (ZOCOR) 10 MG tablet take 1 tablet by mouth every evening AT 6PM 30 tablet 6  . tamsulosin (FLOMAX) 0.4 MG CAPS capsule take 2 capsules once daily (Patient taking differently: take 2 capsules by mouth once daily) 60 capsule 2   No current facility-administered medications for this visit.    Past Medical History  Diagnosis Date  . Hypertension   . GERD (gastroesophageal reflux disease)   . Colon polyps 2011    tubular adenoma by excisional biopsy during colonoscopy  . ADD (attention deficit disorder)     Rx-Adderall  . Degenerative disc disease, cervical   . Palpitations 2003    Holter in 2003: PACs and PVCs; negative stress nuclear test in 2005; right bundle branch block; echo in 2008-mild LVH, otherwise normal; 2008-negative stress nuclear test.  . Prostatitis     prostate calcifications by CT  . Sleep apnea     questionable diagnosis  . Pneumonia   .  Anginal pain   . Complete heart block   . Coronary artery disease   . AV block, 3rd degree     Past Surgical History  Procedure Laterality Date  . Anterior cervical decomp/discectomy fusion    . Knee surgery      rt  . Colonoscopy w/ polypectomy  2011    tubular adenoma  . Esophagogastroduodenoscopy      Gastritis  . Pacemaker insertion  12/17/12    MDT Adapta L implanted by Dr Rayann Heman for mobitz II second degree AV block  . Left heart catheterization with coronary angiogram N/A 10/17/2012    Procedure: LEFT HEART CATHETERIZATION WITH CORONARY ANGIOGRAM;  Surgeon: Josue Hector, MD;  Location: [facility] CATH  LAB;  Service: Cardiovascular;  Laterality: N/A;  . Percutaneous coronary stent intervention (pci-s)  10/17/2012    Procedure: PERCUTANEOUS CORONARY STENT INTERVENTION (PCI-S);  Surgeon: Josue Hector, MD;  Location: [facility] CATH LAB;  Service: Cardiovascular;;  . Left heart catheterization with coronary angiogram N/A 12/17/2012    Procedure: LEFT HEART CATHETERIZATION WITH CORONARY ANGIOGRAM;  Surgeon: Peter M Martinique, MD;  Location: [facility] CATH LAB;  Service: Cardiovascular;  Laterality: N/A;  . Permanent pacemaker insertion N/A 12/17/2012    Procedure: PERMANENT PACEMAKER INSERTION;  Surgeon: Thompson Grayer, MD;  Location: [facility] CATH LAB;  Service: Cardiovascular;  Laterality: N/A;  . Left heart catheterization with coronary angiogram N/A 06/11/2013    Procedure: LEFT HEART CATHETERIZATION WITH CORONARY ANGIOGRAM;  Surgeon: Wellington Hampshire, MD;  Location: [facility] CATH LAB;  Service: Cardiovascular;  Laterality: N/A;  . Biventricular pacemaker upgrade/ system revision  04/02/14    removal of previous atrial and ventricular leads with placement of a new MDT Consulta CRT-P system at [facility] by Dr Violet Baldy    History   Social History  . Marital Status: Married    Spouse Name: N/A  . Number of Children: 3  . Years of Education: N/A   Occupational History  . Not on file.   Social History Main Topics  . Smoking status: Former Smoker -- 2.50 packs/day for 40 years    Types: Cigarettes    Start date: 04/18/1967    Quit date: 12/20/2003  . Smokeless tobacco: Former Systems developer  . Alcohol Use: 0.6 oz/week    1 Shots of liquor per week     Comment: occasional  . Drug Use: No     Comment: 30 + years ago - "crank, marjiuanna, ect."  . Sexual Activity: Yes    Birth Control/ Protection: Surgical   Other Topics Concern  . Not on file   Social History Narrative     Filed Vitals:   08/21/14 1601  BP: 130/70  Pulse: 93  Height: 6' (1.829 m)  Weight: 230 lb (104.327 kg)  SpO2: 96%    PHYSICAL  EXAM General: NAD HEENT: Normal. Neck: No JVD, no thyromegaly. Lungs: Clear to auscultation bilaterally with normal respiratory effort. CV: Nondisplaced PMI.  Regular rate and rhythm, normal S1/S2, no S3/S4, no murmur. No pretibial or periankle edema.  No carotid bruit.  Normal pedal pulses.  Abdomen: Soft, nontender, no hepatosplenomegaly, no distention.  Neurologic: Alert and oriented x 3.  Psych: Normal affect. Skin: Normal. Musculoskeletal: Normal range of motion, no gross deformities. Extremities: No clubbing or cyanosis.   ECG: Most recent ECG reviewed.      ASSESSMENT AND PLAN: 1. CAD s/p PCI of RCA: Symptomatically stable. Continue ASA, Plavix, and simvastatin. May stop Plavix at next visit.  2. Essential HTN: Now controlled on losartan 100 mg and HCTZ 25 mg. Did not tolerate amlodipine in the past. However, significantly symptomatic. Will stop HCTZ to see if this helps.  3. Mobitz II AV block (symptomatic) s/p CRT-P revision at [facility]: Has continued to have symptoms in spite of pacemaker revision. Will stop HCTZ. Due to palpitations, may increase nadolol to 20 mg at next visit. Most recent device interrogation noted above, with only 2 high ventricular rate episodes, the longest lasting 8 beats.  Dispo: f/u August.  Time spent: 40 minutes, of which greater than 50% was spent reviewing symptoms, relevant blood tests and studies, and discussing management plan with the patient.   Kate Sable, M.D., F.A.C.C.

## 2014-08-22 ENCOUNTER — Other Ambulatory Visit: Payer: Self-pay | Admitting: Family Medicine

## 2014-09-02 ENCOUNTER — Ambulatory Visit (INDEPENDENT_AMBULATORY_CARE_PROVIDER_SITE_OTHER): Payer: 59 | Admitting: Family Medicine

## 2014-09-02 ENCOUNTER — Encounter: Payer: Self-pay | Admitting: Family Medicine

## 2014-09-02 VITALS — BP 140/90 | Temp 98.0°F | Ht 72.0 in | Wt 236.2 lb

## 2014-09-02 DIAGNOSIS — J3489 Other specified disorders of nose and nasal sinuses: Secondary | ICD-10-CM

## 2014-09-02 DIAGNOSIS — L03211 Cellulitis of face: Secondary | ICD-10-CM | POA: Diagnosis not present

## 2014-09-02 MED ORDER — DOXYCYCLINE HYCLATE 100 MG PO CAPS: 100.0000 mg | ORAL_CAPSULE | Freq: Two times a day (BID) | ORAL | Status: DC

## 2014-09-02 NOTE — Progress Notes (Signed)
   Subjective:    Patient ID: [name], male    DOB: 1955-07-18, 59 y.o.   MRN: [id number]  Rash This is a new problem. The current episode started 1 to 4 weeks ago. The problem is unchanged. Pain location: Nose. The rash is characterized by redness. He was exposed to nothing. Past treatments include antibiotic cream. The treatment provided no relief.   Patient states that he has no concerns at this time.  The patient does relate he is concerned about his blood pressure he states he tried a diuretic but it made him feel really bad so he stopped taking it he knows that his blood pressure is higher than what it should be but he states whenever he goes down into the range where they want him to be he feels fatigued and tired  Review of Systems  Skin: Positive for rash.   denies chest tightness pressure pain shortness of breath     Objective:   Physical Exam Lungs are clear hearts regular pulse normal extremities no edema skin warm dry Patient with moderate cellulitis on the right side of the nose. Tenderness inside the left side of the nose no abscess.       Assessment & Plan:  Cellulitis-nasal culture taken. Doxycycline twice a day for 10-14 days with snack and fluids warning signs regarding cellulitis were discussed. No abscess noted.  Elevated blood pressure-blood pressure today 168/94. I told the patient that this is not where it needs to be. I have encouraged him to call his cardiologist's office to see if diltiazem can be increased from 240 mg to 300 mg. I told the patient that he should seek better blood pressure control through medications and diet. He relates a willingness to call cardiology to see if there is any adjustments they can do

## 2014-09-04 ENCOUNTER — Telehealth: Payer: Self-pay

## 2014-09-04 DIAGNOSIS — R001 Bradycardia, unspecified: Secondary | ICD-10-CM

## 2014-09-04 DIAGNOSIS — I441 Atrioventricular block, second degree: Secondary | ICD-10-CM

## 2014-09-04 LAB — WOUND CULTURE: Organism ID, Bacteria: NONE SEEN

## 2014-09-04 MED ORDER — NADOLOL 20 MG PO TABS: 20.0000 mg | ORAL_TABLET | Freq: Every day | ORAL | Status: DC

## 2014-09-04 NOTE — Telephone Encounter (Signed)
Spoke with wife, pt will increase Nadolol to 20 mg daily,escribed new rx

## 2014-09-04 NOTE — Telephone Encounter (Signed)
-----   Message from Herminio Commons, MD sent at 09/03/2014  9:10 AM EDT ----- Regarding: BP elevated according to Dr. Wolfgang Phoenix Can increase nadolol to 20 mg daily.  Dr. Bronson Ing   ----- Message -----    From: Kathyrn Drown, MD    Sent: 09/02/2014   4:13 PM      To: Herminio Commons, MD  Patient's blood pressure excessively elevated on today's visit he had stopped diuretic. I encouraged him to connect with your staff regarding adjustments of medications thank you

## 2014-09-05 ENCOUNTER — Encounter: Payer: Self-pay | Admitting: Family Medicine

## 2014-10-26 ENCOUNTER — Telehealth: Payer: Self-pay | Admitting: Cardiology

## 2014-10-26 ENCOUNTER — Encounter: Payer: 59 | Admitting: *Deleted

## 2014-10-26 NOTE — Telephone Encounter (Signed)
LMOVM reminding pt to send remote transmission.   

## 2014-10-27 ENCOUNTER — Encounter: Payer: Self-pay | Admitting: Cardiology

## 2014-10-31 ENCOUNTER — Other Ambulatory Visit: Payer: Self-pay | Admitting: Family Medicine

## 2014-11-02 NOTE — Telephone Encounter (Signed)
Needs office visit.

## 2014-11-23 ENCOUNTER — Other Ambulatory Visit: Payer: Self-pay | Admitting: Family Medicine

## 2014-11-28 ENCOUNTER — Other Ambulatory Visit: Payer: Self-pay | Admitting: Family Medicine

## 2014-11-30 ENCOUNTER — Encounter: Payer: Self-pay | Admitting: Cardiovascular Disease

## 2014-11-30 ENCOUNTER — Ambulatory Visit (INDEPENDENT_AMBULATORY_CARE_PROVIDER_SITE_OTHER): Payer: 59 | Admitting: Cardiovascular Disease

## 2014-11-30 VITALS — BP 116/68 | HR 75 | Ht 72.0 in | Wt 229.0 lb

## 2014-11-30 DIAGNOSIS — Z950 Presence of cardiac pacemaker: Secondary | ICD-10-CM

## 2014-11-30 DIAGNOSIS — I2510 Atherosclerotic heart disease of native coronary artery without angina pectoris: Secondary | ICD-10-CM | POA: Diagnosis not present

## 2014-11-30 DIAGNOSIS — I441 Atrioventricular block, second degree: Secondary | ICD-10-CM

## 2014-11-30 DIAGNOSIS — I10 Essential (primary) hypertension: Secondary | ICD-10-CM

## 2014-11-30 NOTE — Patient Instructions (Signed)
Your physician wants you to follow-up in:  1 YEAR WITH DR. KONESWARAN. You will receive a reminder letter in the mail two months in advance. If you don't receive a letter, please call our office to schedule the follow-up appointment.   Your physician recommends that you continue on your current medications as directed. Please refer to the Current Medication list given to you today.  Thanks for choosing [facility]!!!   

## 2014-11-30 NOTE — Progress Notes (Signed)
Patient ID: [name], male   DOB: Apr 28, 1955, 59 y.o.   MRN: [id number]      SUBJECTIVE: The patient presents for routine follow-up of coronary artery disease. Pacemaker interrogation on 07/27/14 demonstrated normal device function with no mode switches and 2 episodes of high ventricular rates with the longest lasting 8 beats. Biventricular pacing is occurring greater than 99%. The lower rate was increased from 50-60 bpm.  Denies chest pain. Says he feels better when SBP in 130 range. Denies shortness of breath and palpitations. Likes to walk. Eating healthy. Does smoke.    Review of Systems: As per "subjective", otherwise negative.  Allergies  Allergen Reactions  . Morphine And Related Itching and Rash    redness  . Latex Rash    Current Outpatient Prescriptions  Medication Sig Dispense Refill  . aspirin 81 MG chewable tablet Chew 81 mg by mouth daily.    . clopidogrel (PLAVIX) 75 MG tablet take 1 tablet by mouth once daily 30 tablet 6  . diltiazem (CARDIZEM CD) 240 MG 24 hr capsule Take 240 mg by mouth daily.   0  . losartan (COZAAR) 100 MG tablet Take 1 tablet (100 mg total) by mouth daily. 90 tablet 3  . nadolol (CORGARD) 20 MG tablet Take 1 tablet (20 mg total) by mouth daily. 90 tablet 3  . nitroGLYCERIN (NITROSTAT) 0.4 MG SL tablet Place 0.4 mg under the tongue every 5 (five) minutes as needed for chest pain (MAX 3 TABLETS).     . pantoprazole (PROTONIX) 40 MG tablet take 1 tablet by mouth once daily 15 tablet 0  . simvastatin (ZOCOR) 10 MG tablet take 1 tablet by mouth every evening AT 6PM 30 tablet 6  . tamsulosin (FLOMAX) 0.4 MG CAPS capsule take 2 capsules by mouth once daily 30 capsule 0   No current facility-administered medications for this visit.    Past Medical History  Diagnosis Date  . Hypertension   . GERD (gastroesophageal reflux disease)   . Colon polyps 2011    tubular adenoma by excisional biopsy during colonoscopy  . ADD (attention deficit  disorder)     Rx-Adderall  . Degenerative disc disease, cervical   . Palpitations 2003    Holter in 2003: PACs and PVCs; negative stress nuclear test in 2005; right bundle branch block; echo in 2008-mild LVH, otherwise normal; 2008-negative stress nuclear test.  . Prostatitis     prostate calcifications by CT  . Sleep apnea     questionable diagnosis  . Pneumonia   . Anginal pain   . Complete heart block   . Coronary artery disease   . AV block, 3rd degree     Past Surgical History  Procedure Laterality Date  . Anterior cervical decomp/discectomy fusion    . Knee surgery      rt  . Colonoscopy w/ polypectomy  2011    tubular adenoma  . Esophagogastroduodenoscopy      Gastritis  . Pacemaker insertion  12/17/12    MDT Adapta L implanted by Dr Rayann Heman for mobitz II second degree AV block  . Left heart catheterization with coronary angiogram N/A 10/17/2012    Procedure: LEFT HEART CATHETERIZATION WITH CORONARY ANGIOGRAM;  Surgeon: Josue Hector, MD;  Location: [facility] CATH LAB;  Service: Cardiovascular;  Laterality: N/A;  . Percutaneous coronary stent intervention (pci-s)  10/17/2012    Procedure: PERCUTANEOUS CORONARY STENT INTERVENTION (PCI-S);  Surgeon: Josue Hector, MD;  Location: [facility] CATH LAB;  Service: Cardiovascular;;  .  Left heart catheterization with coronary angiogram N/A 12/17/2012    Procedure: LEFT HEART CATHETERIZATION WITH CORONARY ANGIOGRAM;  Surgeon: Peter M Martinique, MD;  Location: [facility] CATH LAB;  Service: Cardiovascular;  Laterality: N/A;  . Permanent pacemaker insertion N/A 12/17/2012    Procedure: PERMANENT PACEMAKER INSERTION;  Surgeon: Thompson Grayer, MD;  Location: [facility] CATH LAB;  Service: Cardiovascular;  Laterality: N/A;  . Left heart catheterization with coronary angiogram N/A 06/11/2013    Procedure: LEFT HEART CATHETERIZATION WITH CORONARY ANGIOGRAM;  Surgeon: Wellington Hampshire, MD;  Location: [facility] CATH LAB;  Service: Cardiovascular;  Laterality: N/A;  . Biventricular pacemaker  upgrade/ system revision  04/02/14    removal of previous atrial and ventricular leads with placement of a new MDT Consulta CRT-P system at [facility] by Dr Violet Baldy    Social History   Social History  . Marital Status: Married    Spouse Name: N/A  . Number of Children: 3  . Years of Education: N/A   Occupational History  . Not on file.   Social History Main Topics  . Smoking status: Former Smoker -- 2.50 packs/day for 40 years    Types: Cigarettes    Start date: 04/18/1967    Quit date: 12/20/2003  . Smokeless tobacco: Former Systems developer  . Alcohol Use: 0.6 oz/week    1 Shots of liquor per week     Comment: occasional  . Drug Use: No     Comment: 30 + years ago - "crank, marjiuanna, ect."  . Sexual Activity: Yes    Birth Control/ Protection: Surgical   Other Topics Concern  . Not on file   Social History Narrative     Filed Vitals:   11/30/14 1629  BP: 116/68  Pulse: 75  Height: 6' (1.829 m)  Weight: 229 lb (103.874 kg)  SpO2: 93%    PHYSICAL EXAM General: NAD HEENT: Normal. Neck: No JVD, no thyromegaly. Lungs: Clear to auscultation bilaterally with normal respiratory effort. CV: Nondisplaced PMI.  Regular rate and rhythm, normal S1/S2, no S3/S4, no murmur. No pretibial or periankle edema.  No carotid bruit.  Normal pedal pulses.  Abdomen: Soft, nontender, no hepatosplenomegaly, no distention.  Neurologic: Alert and oriented x 3.  Psych: Normal affect. Skin: Normal. Musculoskeletal: Normal range of motion, no gross deformities. Extremities: No clubbing or cyanosis.   ECG: Most recent ECG reviewed.      ASSESSMENT AND PLAN: 1. CAD s/p PCI of RCA: Symptomatically stable. Continue ASA and simvastatin. Will stop Plavix.  2. Essential HTN: Controlled on losartan 100 mg, Cardizem CD 240 mg, and nadolol 20 mg. No changes.  3. Mobitz II AV block (symptomatic) s/p CRT-P revision at [facility]: Has continued to have symptoms in spite of pacemaker  revision. Most recent device interrogation noted above, with only 2 high ventricular rate episodes, the longest lasting 8 beats. On long-acting diltiazem 240 mg.  Dispo: f/u 1 year  Kate Sable, M.D., F.A.C.C.

## 2014-12-03 ENCOUNTER — Encounter: Payer: Self-pay | Admitting: Gastroenterology

## 2014-12-11 ENCOUNTER — Other Ambulatory Visit: Payer: Self-pay | Admitting: Family Medicine

## 2014-12-11 ENCOUNTER — Ambulatory Visit (INDEPENDENT_AMBULATORY_CARE_PROVIDER_SITE_OTHER): Payer: 59 | Admitting: Family Medicine

## 2014-12-11 ENCOUNTER — Encounter: Payer: Self-pay | Admitting: Family Medicine

## 2014-12-11 VITALS — BP 128/80 | Temp 98.1°F | Ht 72.0 in | Wt 229.0 lb

## 2014-12-11 DIAGNOSIS — R1032 Left lower quadrant pain: Secondary | ICD-10-CM

## 2014-12-11 DIAGNOSIS — E785 Hyperlipidemia, unspecified: Secondary | ICD-10-CM

## 2014-12-11 DIAGNOSIS — Z79899 Other long term (current) drug therapy: Secondary | ICD-10-CM

## 2014-12-11 DIAGNOSIS — N410 Acute prostatitis: Secondary | ICD-10-CM | POA: Diagnosis not present

## 2014-12-11 DIAGNOSIS — Z125 Encounter for screening for malignant neoplasm of prostate: Secondary | ICD-10-CM

## 2014-12-11 MED ORDER — ONDANSETRON HCL 8 MG PO TABS: 8.0000 mg | ORAL_TABLET | Freq: Three times a day (TID) | ORAL | Status: DC | PRN

## 2014-12-11 MED ORDER — HYDROCODONE-ACETAMINOPHEN 7.5-325 MG PO TABS: 1.0000 | ORAL_TABLET | Freq: Four times a day (QID) | ORAL | Status: DC | PRN

## 2014-12-11 MED ORDER — ALPRAZOLAM 0.5 MG PO TABS: 0.5000 mg | ORAL_TABLET | Freq: Two times a day (BID) | ORAL | Status: DC | PRN

## 2014-12-11 MED ORDER — CIPROFLOXACIN HCL 500 MG PO TABS: 500.0000 mg | ORAL_TABLET | Freq: Two times a day (BID) | ORAL | Status: DC

## 2014-12-11 NOTE — Progress Notes (Signed)
   Subjective:    Patient ID: [name], male    DOB: 10-27-1955, 59 y.o.   MRN: [id number]  Abdominal Pain This is a new problem. The current episode started 1 to 4 weeks ago. The onset quality is gradual. The problem occurs intermittently. The problem has been unchanged. The pain is located in the generalized abdominal region. The pain is moderate. The quality of the pain is aching. The abdominal pain does not radiate. Associated symptoms include diarrhea and nausea. Pertinent negatives include no fever. Nothing aggravates the pain. The pain is relieved by nothing. Treatments tried: Copywriter, advertising. The treatment provided no relief.  Patient is also having pain in his groin area. Patient relates pain radiates into the left testicular region. Denies mucousy or bloody stools. Patient relates some urinary frequency Patient states that he is still having issues with his nose also.   Review of Systems  Constitutional: Positive for chills. Negative for fever and fatigue.  HENT: Negative for congestion.   Gastrointestinal: Positive for nausea, abdominal pain and diarrhea.       Objective:   Physical Exam  Lungs clear heart regular abdomen soft with left lower abdominal tenderness no guarding or rebound prostate exam deferred by patient. Patient also relates some lower pelvic discomfort in the central area. Patient relates having diarrhea. Left side nasal area slightly sore no obvious cellulitis.  Previously we had done a nasal culture which had been negative.    Assessment & Plan:  Left lower abdominal pain discomfort-antibiotics prescribed this could be a colitis or could be related to prostatitis if patient does not improve by next week we will need to do some stool testing as well as additional testing. Antibiotics prescribed for probable prostatitis  If continued nasal discomfort will need referral to ENT

## 2014-12-15 LAB — LIPID PANEL
Chol/HDL Ratio: 2.8 ratio units (ref 0.0–5.0)
Cholesterol, Total: 134 mg/dL (ref 100–199)
HDL: 48 mg/dL (ref >39)
LDL Calculated: 65 mg/dL (ref 0–99)
Triglycerides: 104 mg/dL (ref 0–149)
VLDL Cholesterol Cal: 21 mg/dL (ref 5–40)

## 2014-12-15 LAB — CBC WITH DIFFERENTIAL/PLATELET
Basophils Absolute: 0 10*3/uL (ref 0.0–0.2)
Basos: 1 %
EOS (ABSOLUTE): 0.2 10*3/uL (ref 0.0–0.4)
Eos: 3 %
Hematocrit: 47.8 % (ref 37.5–51.0)
Hemoglobin: 16.1 g/dL (ref 12.6–17.7)
Immature Grans (Abs): 0 10*3/uL (ref 0.0–0.1)
Immature Granulocytes: 0 %
Lymphocytes Absolute: 1.7 10*3/uL (ref 0.7–3.1)
Lymphs: 27 %
MCH: 29.6 pg (ref 26.6–33.0)
MCHC: 33.7 g/dL (ref 31.5–35.7)
MCV: 88 fL (ref 79–97)
Monocytes Absolute: 0.6 10*3/uL (ref 0.1–0.9)
Monocytes: 9 %
Neutrophils Absolute: 3.8 10*3/uL (ref 1.4–7.0)
Neutrophils: 60 %
Platelets: 219 10*3/uL (ref 150–379)
RBC: 5.44 x10E6/uL (ref 4.14–5.80)
RDW: 13.3 % (ref 12.3–15.4)
WBC: 6.3 10*3/uL (ref 3.4–10.8)

## 2014-12-15 LAB — PSA: Prostate Specific Ag, Serum: 1.5 ng/mL (ref 0.0–4.0)

## 2014-12-15 LAB — BASIC METABOLIC PANEL
BUN/Creatinine Ratio: 12 (ref 10–22)
BUN: 15 mg/dL (ref 8–27)
CO2: 22 mmol/L (ref 18–29)
Calcium: 9.6 mg/dL (ref 8.6–10.2)
Chloride: 102 mmol/L (ref 97–108)
Creatinine, Ser: 1.28 mg/dL — ABNORMAL HIGH (ref 0.76–1.27)
GFR calc Af Amer: 70 mL/min/{1.73_m2} (ref >59)
GFR calc non Af Amer: 60 mL/min/{1.73_m2} (ref >59)
Glucose: 105 mg/dL — ABNORMAL HIGH (ref 65–99)
Potassium: 5 mmol/L (ref 3.5–5.2)
Sodium: 144 mmol/L (ref 134–144)

## 2014-12-15 LAB — C-REACTIVE PROTEIN: CRP: 3.7 mg/L (ref 0.0–4.9)

## 2014-12-28 ENCOUNTER — Other Ambulatory Visit: Payer: Self-pay | Admitting: Family Medicine

## 2015-01-08 ENCOUNTER — Ambulatory Visit (INDEPENDENT_AMBULATORY_CARE_PROVIDER_SITE_OTHER): Payer: 59 | Admitting: Family Medicine

## 2015-01-08 ENCOUNTER — Encounter: Payer: Self-pay | Admitting: Family Medicine

## 2015-01-08 VITALS — BP 130/90 | Ht 72.0 in | Wt 227.0 lb

## 2015-01-08 DIAGNOSIS — N451 Epididymitis: Secondary | ICD-10-CM | POA: Diagnosis not present

## 2015-01-08 MED ORDER — LEVOFLOXACIN 750 MG PO TABS: 750.0000 mg | ORAL_TABLET | Freq: Every day | ORAL | Status: DC

## 2015-01-08 NOTE — Progress Notes (Signed)
   Subjective:    Patient ID: [name], male    DOB: 03/27/1956, 59 y.o.   MRN: [id number]  HPI Patient is here today for a follow up visit on his abdominal pain. Patient states that the abdominal pain is about the same. It has not improved much at all. Patient also states that low back pain is still present. Patient has completed antibiotics.  Patient denies nausea vomiting does state the pain radiates from the testicle in the left lower quadrant and into his back denies high fever chills or dysuria. Denies bloody stools states bowel movements normal Patient has no other concerns at this time.    Review of Systems See above. No fevers chills no change in appetite.    Objective:   Physical Exam Lungs clear heart regular abdomen soft some minimal lower left quadrant tenderness with palpation negative with muscle movements genital exam reveals tenderness behind the left testicle probable epididymitis.       Assessment & Plan:  Epididymitis-Levaquin 750 mg daily for the next 30 days recheck in 10 days if not significantly improved recommend ultrasound of testicular region hold off on CAT scan of the abdomen currently.

## 2015-01-18 ENCOUNTER — Ambulatory Visit (INDEPENDENT_AMBULATORY_CARE_PROVIDER_SITE_OTHER): Payer: 59 | Admitting: Family Medicine

## 2015-01-18 ENCOUNTER — Encounter: Payer: Self-pay | Admitting: Family Medicine

## 2015-01-18 VITALS — BP 142/92 | Ht 72.0 in | Wt 227.0 lb

## 2015-01-18 DIAGNOSIS — N50819 Testicular pain, unspecified: Secondary | ICD-10-CM | POA: Diagnosis not present

## 2015-01-18 DIAGNOSIS — R1032 Left lower quadrant pain: Secondary | ICD-10-CM | POA: Diagnosis not present

## 2015-01-18 NOTE — Progress Notes (Signed)
   Subjective:    Patient ID: [name], male    DOB: 13-Oct-1955, 59 y.o.   MRN: [id number]  HPI Patient arrives for a follow up on abdominal pain-patient currently on Levaquin 750 mg. This been gone on for several months worse over the past few weeks. Has soreness and pain not worse with any particular movement no rectal bleeding no vomiting no hematuria recently been on antibiotics for the possibility of a epididymitis PMH benign patient under a fair amount of stress  Review of Systems Left lower quadrant abdominal pain testicular pain denies hematuria meant he denies fever chills    Objective:   Physical Exam Lungs are clear hearts regular abdomen soft with left lower quadrant abdominal tenderness testicular exam behind the left testicle he has tenderness and pain no swelling       Assessment & Plan:  Patient with testicular pain and left lower quadrant pain-we will do testicular ultrasound and go ahead and set him up with urology. The patient maintained up needing to have a CAT scan of the abdomen as well. Recent lab work in August looks good I don't recommend repeat lab work.patient up-to-date on colonoscopy denies rectal bleeding

## 2015-01-20 ENCOUNTER — Other Ambulatory Visit: Payer: Self-pay | Admitting: Family Medicine

## 2015-01-20 DIAGNOSIS — N50819 Testicular pain, unspecified: Secondary | ICD-10-CM

## 2015-01-21 ENCOUNTER — Ambulatory Visit (HOSPITAL_COMMUNITY)
Admission: RE | Admit: 2015-01-21 | Discharge: 2015-01-21 | Disposition: A | Discharge status: Home / Self Care | Payer: 59 | Source: Ambulatory Visit | Attending: Family Medicine | Admitting: Family Medicine

## 2015-01-21 DIAGNOSIS — I861 Scrotal varices: Secondary | ICD-10-CM | POA: Diagnosis not present

## 2015-01-21 DIAGNOSIS — N5082 Scrotal pain: Secondary | ICD-10-CM | POA: Insufficient documentation

## 2015-01-21 DIAGNOSIS — N50819 Testicular pain, unspecified: Secondary | ICD-10-CM

## 2015-01-21 DIAGNOSIS — N503 Cyst of epididymis: Secondary | ICD-10-CM | POA: Insufficient documentation

## 2015-01-22 NOTE — Addendum Note (Signed)
Addended by: Ofilia Neas R on: 01/22/2015 04:52 PM   Modules accepted: Orders

## 2015-01-27 ENCOUNTER — Ambulatory Visit (HOSPITAL_COMMUNITY)
Admission: RE | Admit: 2015-01-27 | Discharge: 2015-01-27 | Disposition: A | Discharge status: Home / Self Care | Payer: 59 | Source: Ambulatory Visit | Attending: Family Medicine | Admitting: Family Medicine

## 2015-01-27 ENCOUNTER — Other Ambulatory Visit: Payer: Self-pay | Admitting: Family Medicine

## 2015-01-27 DIAGNOSIS — R1032 Left lower quadrant pain: Secondary | ICD-10-CM | POA: Insufficient documentation

## 2015-01-27 DIAGNOSIS — N50819 Testicular pain, unspecified: Secondary | ICD-10-CM | POA: Insufficient documentation

## 2015-01-27 LAB — POCT I-STAT CREATININE: Creatinine, Ser: 1.2 mg/dL (ref 0.61–1.24)

## 2015-01-27 MED ORDER — SODIUM CHLORIDE 0.9 % IJ SOLN
INTRAMUSCULAR | Status: AC
  Filled 2015-01-27: qty 1000

## 2015-01-27 MED ORDER — IOHEXOL 300 MG/ML  SOLN
100.0000 mL | Freq: Once | INTRAMUSCULAR | Status: AC | PRN
  Administered 2015-01-27: 100 mL via INTRAVENOUS

## 2015-01-27 MED ORDER — SODIUM CHLORIDE 0.9 % IJ SOLN
INTRAMUSCULAR | Status: AC
  Filled 2015-01-27: qty 60

## 2015-02-03 ENCOUNTER — Encounter: Payer: Self-pay | Admitting: Internal Medicine

## 2015-02-03 ENCOUNTER — Ambulatory Visit (INDEPENDENT_AMBULATORY_CARE_PROVIDER_SITE_OTHER): Payer: 59 | Admitting: *Deleted

## 2015-02-03 DIAGNOSIS — I441 Atrioventricular block, second degree: Secondary | ICD-10-CM

## 2015-02-03 DIAGNOSIS — R001 Bradycardia, unspecified: Secondary | ICD-10-CM

## 2015-02-03 LAB — CUP PACEART INCLINIC DEVICE CHECK
Battery Remaining Longevity: 78 mo
Battery Voltage: 3.02 V
Brady Statistic AP VP Percent: 11.66 %
Brady Statistic AP VS Percent: 0.01 %
Brady Statistic AS VP Percent: 87.97 %
Brady Statistic AS VS Percent: 0.36 %
Brady Statistic RA Percent Paced: 11.67 %
Brady Statistic RV Percent Paced: 99.63 %
Date Time Interrogation Session: 20161019131053
Implantable Lead Implant Date: 20151217
Implantable Lead Implant Date: 20151217
Implantable Lead Implant Date: 20151217
Implantable Lead Location: 753858
Implantable Lead Location: 753859
Implantable Lead Location: 753860
Implantable Lead Model: 4196
Implantable Lead Model: 5076
Implantable Lead Model: 5076
Lead Channel Impedance Value: 399 Ohm
Lead Channel Impedance Value: 418 Ohm
Lead Channel Impedance Value: 437 Ohm
Lead Channel Impedance Value: 494 Ohm
Lead Channel Impedance Value: 513 Ohm
Lead Channel Impedance Value: 608 Ohm
Lead Channel Impedance Value: 665 Ohm
Lead Channel Impedance Value: 779 Ohm
Lead Channel Impedance Value: 988 Ohm
Lead Channel Pacing Threshold Amplitude: 0.75 V
Lead Channel Pacing Threshold Amplitude: 0.75 V
Lead Channel Pacing Threshold Amplitude: 1 V
Lead Channel Pacing Threshold Pulse Width: 0.4 ms
Lead Channel Pacing Threshold Pulse Width: 0.4 ms
Lead Channel Pacing Threshold Pulse Width: 0.4 ms
Lead Channel Sensing Intrinsic Amplitude: 1.375 mV
Lead Channel Sensing Intrinsic Amplitude: 2.625 mV
Lead Channel Setting Pacing Amplitude: 2 V
Lead Channel Setting Pacing Amplitude: 2.25 V
Lead Channel Setting Pacing Amplitude: 2.5 V
Lead Channel Setting Pacing Pulse Width: 0.4 ms
Lead Channel Setting Pacing Pulse Width: 0.4 ms
Lead Channel Setting Sensing Sensitivity: 0.9 mV

## 2015-02-03 NOTE — Progress Notes (Signed)
CRT-P device check in clinic. Normal device function. Thresholds, sensing, impedance consistent with previous measurements. Histograms appropriate for patient and level of activity. No mode switches recorded.  (9) ventricular high rate episodes---NSVT per EGMs/markers. (6) VS episodes---max dur. 12 sec---NSVT per markers. Patient bi-ventricularly pacing 99.4% of the time with 0.3% as VSRp. Device programmed with appropriate safety margins. Device heart failure diagnostics are within normal limits and stable over time. Estimated longevity 6 years. Patient will follow up via [facility] on 1/18 and with [facility] in 07-2015.

## 2015-02-08 ENCOUNTER — Telehealth: Payer: Self-pay | Admitting: Family Medicine

## 2015-02-08 ENCOUNTER — Encounter: Payer: Self-pay | Admitting: Family Medicine

## 2015-02-08 NOTE — Addendum Note (Signed)
Addended by: Dairl Ponder on: 02/08/2015 11:08 AM   Modules accepted: Orders

## 2015-02-08 NOTE — Telephone Encounter (Signed)
So this patient needs to see general surgery. I am open to using who ever is on his insurance. Because of his cardiac condition he would prefer it to be in [place]. If not in [place] please find out from insurance who fell free to run it by me thank you

## 2015-02-08 NOTE — Telephone Encounter (Signed)
[facility] does not participate with pt's insurcane [facility]) Please advise on where to refer for hernia surgery

## 2015-02-09 NOTE — Telephone Encounter (Signed)
Spoke with patient, he would like [facility] due to [facility] not accepting insurance, referral done & patient aware

## 2015-02-11 DIAGNOSIS — K4090 Unilateral inguinal hernia, without obstruction or gangrene, not specified as recurrent: Secondary | ICD-10-CM | POA: Insufficient documentation

## 2015-02-16 ENCOUNTER — Other Ambulatory Visit: Payer: Self-pay | Admitting: Family Medicine

## 2015-02-17 NOTE — Telephone Encounter (Signed)
May approve 3 months

## 2015-02-18 NOTE — Telephone Encounter (Signed)
May approve 3 months. This appears to be a duplicate message thank you

## 2015-02-24 ENCOUNTER — Other Ambulatory Visit: Payer: Self-pay | Admitting: Family Medicine

## 2015-02-24 MED ORDER — HYDROCODONE-ACETAMINOPHEN 7.5-325 MG PO TABS: 1.0000 | ORAL_TABLET | Freq: Four times a day (QID) | ORAL | Status: DC | PRN

## 2015-02-24 NOTE — Telephone Encounter (Signed)
May have refill of this medicine, #30

## 2015-02-26 ENCOUNTER — Other Ambulatory Visit: Payer: Self-pay | Admitting: Family Medicine

## 2015-03-02 ENCOUNTER — Ambulatory Visit (INDEPENDENT_AMBULATORY_CARE_PROVIDER_SITE_OTHER): Payer: 59 | Admitting: Urology

## 2015-03-02 DIAGNOSIS — K4090 Unilateral inguinal hernia, without obstruction or gangrene, not specified as recurrent: Secondary | ICD-10-CM | POA: Diagnosis not present

## 2015-03-02 DIAGNOSIS — N4340 Spermatocele of epididymis, unspecified: Secondary | ICD-10-CM

## 2015-03-02 DIAGNOSIS — N5201 Erectile dysfunction due to arterial insufficiency: Secondary | ICD-10-CM

## 2015-03-18 ENCOUNTER — Other Ambulatory Visit: Payer: Self-pay | Admitting: Family Medicine

## 2015-03-28 ENCOUNTER — Other Ambulatory Visit: Payer: Self-pay | Admitting: Family Medicine

## 2015-04-02 ENCOUNTER — Encounter: Payer: Self-pay | Admitting: Family Medicine

## 2015-04-18 HISTORY — PX: COLONOSCOPY: SHX174

## 2015-04-18 HISTORY — PX: POLYPECTOMY: SHX149

## 2015-04-21 ENCOUNTER — Other Ambulatory Visit: Payer: Self-pay | Admitting: Cardiovascular Disease

## 2015-04-25 ENCOUNTER — Other Ambulatory Visit: Payer: Self-pay | Admitting: Family Medicine

## 2015-05-10 ENCOUNTER — Other Ambulatory Visit: Payer: Self-pay | Admitting: Family Medicine

## 2015-05-23 ENCOUNTER — Emergency Department (HOSPITAL_COMMUNITY): Payer: BLUE CROSS/BLUE SHIELD

## 2015-05-23 ENCOUNTER — Emergency Department (HOSPITAL_COMMUNITY)
Admission: EM | Admit: 2015-05-23 | Discharge: 2015-05-24 | Disposition: A | Discharge status: Home / Self Care | Payer: BLUE CROSS/BLUE SHIELD | Attending: Emergency Medicine | Admitting: Emergency Medicine

## 2015-05-23 ENCOUNTER — Encounter (HOSPITAL_COMMUNITY): Payer: Self-pay

## 2015-05-23 DIAGNOSIS — R110 Nausea: Secondary | ICD-10-CM | POA: Insufficient documentation

## 2015-05-23 DIAGNOSIS — I10 Essential (primary) hypertension: Secondary | ICD-10-CM | POA: Insufficient documentation

## 2015-05-23 DIAGNOSIS — Z9889 Other specified postprocedural states: Secondary | ICD-10-CM | POA: Diagnosis not present

## 2015-05-23 DIAGNOSIS — Z8659 Personal history of other mental and behavioral disorders: Secondary | ICD-10-CM | POA: Insufficient documentation

## 2015-05-23 DIAGNOSIS — Z8701 Personal history of pneumonia (recurrent): Secondary | ICD-10-CM | POA: Insufficient documentation

## 2015-05-23 DIAGNOSIS — Z86010 Personal history of colonic polyps: Secondary | ICD-10-CM | POA: Insufficient documentation

## 2015-05-23 DIAGNOSIS — Z79899 Other long term (current) drug therapy: Secondary | ICD-10-CM | POA: Diagnosis not present

## 2015-05-23 DIAGNOSIS — K219 Gastro-esophageal reflux disease without esophagitis: Secondary | ICD-10-CM | POA: Insufficient documentation

## 2015-05-23 DIAGNOSIS — Z87438 Personal history of other diseases of male genital organs: Secondary | ICD-10-CM | POA: Diagnosis not present

## 2015-05-23 DIAGNOSIS — Z7982 Long term (current) use of aspirin: Secondary | ICD-10-CM | POA: Diagnosis not present

## 2015-05-23 DIAGNOSIS — Z91040 Latex allergy status: Secondary | ICD-10-CM | POA: Diagnosis not present

## 2015-05-23 DIAGNOSIS — Z87891 Personal history of nicotine dependence: Secondary | ICD-10-CM | POA: Diagnosis not present

## 2015-05-23 DIAGNOSIS — Z8669 Personal history of other diseases of the nervous system and sense organs: Secondary | ICD-10-CM | POA: Diagnosis not present

## 2015-05-23 DIAGNOSIS — I25119 Atherosclerotic heart disease of native coronary artery with unspecified angina pectoris: Secondary | ICD-10-CM | POA: Diagnosis not present

## 2015-05-23 DIAGNOSIS — Z950 Presence of cardiac pacemaker: Secondary | ICD-10-CM | POA: Diagnosis not present

## 2015-05-23 DIAGNOSIS — R079 Chest pain, unspecified: Secondary | ICD-10-CM | POA: Diagnosis present

## 2015-05-23 DIAGNOSIS — R0789 Other chest pain: Secondary | ICD-10-CM | POA: Diagnosis not present

## 2015-05-23 LAB — BASIC METABOLIC PANEL
Anion gap: 8 (ref 5–15)
BUN: 16 mg/dL (ref 6–20)
CO2: 28 mmol/L (ref 22–32)
Calcium: 8.9 mg/dL (ref 8.9–10.3)
Chloride: 105 mmol/L (ref 101–111)
Creatinine, Ser: 1.13 mg/dL (ref 0.61–1.24)
GFR calc Af Amer: >60 mL/min (ref >60)
GFR calc non Af Amer: >60 mL/min (ref >60)
Glucose, Bld: 150 mg/dL — ABNORMAL HIGH (ref 65–99)
Potassium: 3.9 mmol/L (ref 3.5–5.1)
Sodium: 141 mmol/L (ref 135–145)

## 2015-05-23 LAB — CBC
HCT: 43.8 % (ref 39.0–52.0)
Hemoglobin: 14.6 g/dL (ref 13.0–17.0)
MCH: 29.4 pg (ref 26.0–34.0)
MCHC: 33.3 g/dL (ref 30.0–36.0)
MCV: 88.1 fL (ref 78.0–100.0)
Platelets: 198 10*3/uL (ref 150–400)
RBC: 4.97 MIL/uL (ref 4.22–5.81)
RDW: 13 % (ref 11.5–15.5)
WBC: 6.3 10*3/uL (ref 4.0–10.5)

## 2015-05-23 LAB — TROPONIN I: Troponin I: <0.03 ng/mL (ref <0.031)

## 2015-05-23 MED ORDER — KETOROLAC TROMETHAMINE 30 MG/ML IJ SOLN
30.0000 mg | Freq: Once | INTRAMUSCULAR | Status: AC
  Administered 2015-05-23: 30 mg via INTRAVENOUS
  Filled 2015-05-23: qty 1

## 2015-05-23 MED ORDER — NITROGLYCERIN 2 % TD OINT
1.0000 [in_us] | TOPICAL_OINTMENT | Freq: Once | TRANSDERMAL | Status: AC
  Administered 2015-05-23: 1 [in_us] via TOPICAL
  Filled 2015-05-23: qty 1

## 2015-05-23 MED ORDER — ASPIRIN 81 MG PO CHEW
324.0000 mg | CHEWABLE_TABLET | Freq: Once | ORAL | Status: AC
  Administered 2015-05-23: 324 mg via ORAL
  Filled 2015-05-23: qty 4

## 2015-05-23 NOTE — ED Notes (Signed)
Central chest pain radiating into bilateral shoulders X1 week. Patient states pain "got worse" +NV +weakness +pacemaker

## 2015-05-23 NOTE — ED Provider Notes (Signed)
CSN: [id number]     Arrival date & time 05/23/15  2226 History  By signing my name below, I, Eustaquio Maize, attest that this documentation has been prepared under the direction and in the presence of Rolland Porter, MD at 2337. Electronically Signed: Eustaquio Maize, ED Scribe. 05/23/2015. 11:53 PM.   Chief Complaint  Patient presents with  . Chest Pain   The history is provided by the patient. No language interpreter was used.     HPI Comments: GARIK HUSAR is a 60 y.o. male with hx HTN, CAD, and pacemaker who presents to the Emergency Department complaining of gradual onset, constant, sharp, 3/10, mid sternal chest pain radiating to anterior bilateral shoulders that began this morning. Pt reports having intermittent chest pain for the past week that lasted 2 hours at a time. He came to the ED tonight because the pain has been constant all day and began gradually worsening throughout the day. He reports that his pain was a 5/10 at its worse around 9:30 PM tonight (approximately 2.5 hours ago). There are no modifying factors to the pain. He has tried antacids without relief. He does report that stress and movement makes the pain worse. He states a couple days ago he started trying to use a treadmill but had to stop. He reports feeling nauseous yesterday which was alleviated after antiemetics. Pt has been slightly nauseous all day today. He states he's never had this pain before. He denies any recent change in activity. FHx possible cardiac issues from mother who is alive at age 53. Pt's cousin recently passed away from an MI at the age of 30 after having had heart disease 4 years. Denies shortness of breath, diaphoresis, vomiting, leg swelling, or any other associated symptoms. Pt is former smoker who quit 20 years ago. He does report to vaping occasionally but states there is no nicotine in it. He is an occasional EtOH drinker.   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 He states he's has been working 70-80 hours a week.  PCP - Dr. Wolfgang Phoenix Cardiologist - Dr. Warrick Parisian for pacemaker/ Dr. Bronson Ing  Past Medical History  Diagnosis Date  . Hypertension   . GERD (gastroesophageal reflux disease)   . Colon polyps 2011    tubular adenoma by excisional biopsy during colonoscopy  . ADD (attention deficit disorder)     Rx-Adderall  . Degenerative disc disease, cervical   . Palpitations 2003    Holter in 2003: PACs and PVCs; negative stress nuclear test in 2005; right bundle branch block; echo in 2008-mild LVH, otherwise normal; 2008-negative stress nuclear test.  . Prostatitis     prostate calcifications by CT  . Sleep apnea     questionable diagnosis  . Pneumonia   . Anginal pain ([place])   . Complete heart block ([place])   . Coronary artery disease   . AV block, 3rd degree [facility])    Past Surgical History  Procedure Laterality Date  . Anterior cervical decomp/discectomy fusion    . Knee surgery      rt  . Colonoscopy w/ polypectomy  2011    tubular adenoma  . Esophagogastroduodenoscopy      Gastritis  . Pacemaker insertion  12/17/12    MDT Adapta L implanted by Dr Rayann Heman for mobitz II second degree AV block  . Left heart catheterization with coronary angiogram N/A 10/17/2012    Procedure: LEFT HEART CATHETERIZATION WITH CORONARY ANGIOGRAM;  Surgeon: Wallis Bamberg  Johnsie Cancel, MD;  Location: [place] [facility] CATH LAB;  Service: Cardiovascular;  Laterality: N/A;  . Percutaneous coronary stent intervention (pci-s)  10/17/2012    Procedure: PERCUTANEOUS CORONARY STENT INTERVENTION (PCI-S);  Surgeon: Josue Hector, MD;  Location: [facility] CATH LAB;  Service: Cardiovascular;;  . Left heart catheterization with coronary angiogram N/A 12/17/2012    Procedure: LEFT HEART CATHETERIZATION WITH CORONARY ANGIOGRAM;  Surgeon: Peter M Martinique, MD;  Location: [facility] CATH LAB;  Service: Cardiovascular;  Laterality: N/A;  . Permanent pacemaker insertion N/A 12/17/2012    Procedure: PERMANENT  PACEMAKER INSERTION;  Surgeon: Thompson Grayer, MD;  Location: [facility] CATH LAB;  Service: Cardiovascular;  Laterality: N/A;  . Left heart catheterization with coronary angiogram N/A 06/11/2013    Procedure: LEFT HEART CATHETERIZATION WITH CORONARY ANGIOGRAM;  Surgeon: Wellington Hampshire, MD;  Location: [place] CATH LAB;  Service: Cardiovascular;  Laterality: N/A;  . Biventricular pacemaker upgrade/ system revision  04/02/14    removal of previous atrial and ventricular leads with placement of a new MDT Consulta CRT-P system at [facility] by Dr Violet Baldy   Family History  Problem Relation Age of Onset  . Heart disease Mother   . Hypertension Mother     Carotid disease   Social History  Substance Use Topics  . Smoking status: Former Smoker -- 2.50 packs/day for 40 years    Types: Cigarettes    Start date: 04/18/1967    Quit date: 12/20/2003  . Smokeless tobacco: Former Systems developer  . Alcohol Use: 0.6 oz/week    1 Shots of liquor per week     Comment: occasional  lives with spouse  Review of Systems  Constitutional: Negative for diaphoresis.  Respiratory: Negative for shortness of breath.   Cardiovascular: Positive for chest pain. Negative for leg swelling.  Gastrointestinal: Positive for nausea. Negative for vomiting.  All other systems reviewed and are negative.     Allergies  Morphine and related and Latex  Home Medications   Prior to Admission medications   Medication Sig Start Date End Date Taking? Authorizing Provider  aspirin 81 MG chewable tablet Chew 81 mg by mouth daily.   Yes Historical Provider, MD  HYDROcodone-acetaminophen (NORCO) 7.5-325 MG tablet Take 1 tablet by mouth every 6 (six) hours as needed. Patient taking differently: Take 1 tablet by mouth every 6 (six) hours as needed for moderate pain or severe pain.  02/24/15  Yes Kathyrn Drown, MD  losartan (COZAAR) 100 MG tablet Take 1 tablet (100 mg total) by mouth daily. 08/05/14  Yes Imogene Burn, PA-C  nadolol  (CORGARD) 20 MG tablet Take 1 tablet (20 mg total) by mouth daily. 09/04/14  Yes Herminio Commons, MD  nitroGLYCERIN (NITROSTAT) 0.4 MG SL tablet Place 0.4 mg under the tongue every 5 (five) minutes as needed for chest pain (MAX 3 TABLETS).  05/12/13  Yes Herminio Commons, MD  ondansetron (ZOFRAN) 8 MG tablet Take 1 tablet (8 mg total) by mouth every 8 (eight) hours as needed for nausea. Patient taking differently: Take 8 mg by mouth daily as needed for nausea.  12/11/14  Yes Kathyrn Drown, MD  pantoprazole (PROTONIX) 40 MG tablet take 1 tablet by mouth once daily 04/26/15  Yes Kathyrn Drown, MD  simvastatin (ZOCOR) 10 MG tablet take 1 tablet by mouth EVERY EVENING AT 6PM 04/21/15  Yes Herminio Commons, MD  tamsulosin (FLOMAX) 0.4 MG CAPS capsule take 2 capsules by mouth once daily 05/10/15  Yes Kathyrn Drown, MD  ALPRAZolam (XANAX) 0.5 MG tablet take 1 tablet by mouth twice a day if needed FOR ANXIETY OR SLEEP Patient not taking: Reported on 05/23/2015 02/18/15   Kathyrn Drown, MD  cyclobenzaprine (FLEXERIL) 5 MG tablet Take 1 tablet (5 mg total) by mouth 3 (three) times daily as needed. 05/24/15   Rolland Porter, MD  naproxen (NAPROSYN) 250 MG tablet Take 1 po BID with food prn pain 05/24/15   Rolland Porter, MD   BP 177/92 mmHg  Pulse 76  Temp(Src) 97.9 F (36.6 C) (Oral)  Resp 24  Ht 6' (1.829 m)  Wt 230 lb (104.327 kg)  BMI 31.19 kg/m2  SpO2 96%  Vital signs normal except for hypertension    Physical Exam  Constitutional: He is oriented to person, place, and time. He appears well-developed and well-nourished.  Non-toxic appearance. He does not appear ill. No distress.  HENT:  Head: Normocephalic and atraumatic.  Right Ear: External ear normal.  Left Ear: External ear normal.  Nose: Nose normal. No mucosal edema or rhinorrhea.  Mouth/Throat: Oropharynx is clear and moist and mucous membranes are normal. No dental abscesses or uvula swelling.  Eyes: Conjunctivae and EOM are normal. Pupils are  equal, round, and reactive to light.  Neck: Normal range of motion and full passive range of motion without pain. Neck supple.  Cardiovascular: Normal rate, regular rhythm and normal heart sounds.  Exam reveals no gallop and no friction rub.   No murmur heard. Pulmonary/Chest: Effort normal and breath sounds normal. No respiratory distress. He has no wheezes. He has no rhonchi. He has no rales. He exhibits tenderness. He exhibits no crepitus.    Tender bilateral costochondral junctions  Abdominal: Soft. Normal appearance and bowel sounds are normal. He exhibits no distension. There is no tenderness. There is no rebound and no guarding.  Musculoskeletal: Normal range of motion. He exhibits no edema or tenderness.  Moves all extremities well.   Neurological: He is alert and oriented to person, place, and time. He has normal strength. No cranial nerve deficit.  Skin: Skin is warm, dry and intact. No rash noted. No erythema. No pallor.  Psychiatric: He has a normal mood and affect. His speech is normal and behavior is normal. His mood appears not anxious.  Nursing note and vitals reviewed.   ED Course  Procedures (including critical care time)  Medications  aspirin chewable tablet 324 mg (324 mg Oral Given 05/23/15 2358)  nitroGLYCERIN (NITROGLYN) 2 % ointment 1 inch (1 inch Topical Given 05/23/15 2359)  ketorolac (TORADOL) 30 MG/ML injection 30 mg (30 mg Intravenous Given 05/23/15 2358)     DIAGNOSTIC STUDIES: Oxygen Saturation is 99% on RA, normal by my interpretation.    COORDINATION OF CARE: 11:52 PM-Discussed treatment plan with pt at bedside and pt agreed to plan.   12:40 AM - Pt reports mild alleviation with meds. Discussed negative CXR, BMP, CBC, and Troponin with pt. Advised pt to follow up with his PCP about his elevated glucose level. Will give pt Rx antiinflammatory and muscle relaxer for chest wall pain. Only one troponin was done since his pain is been constant since earlier  today which is over 12 hours.  Labs Review Results for orders placed or performed during the hospital encounter of 0000000  Basic metabolic panel  Result Value Ref Range   Sodium 141 135 - 145 mmol/L   Potassium 3.9 3.5 - 5.1 mmol/L   Chloride 105 101 - 111 mmol/L   CO2 28 22 -  32 mmol/L   Glucose, Bld 150 (H) 65 - 99 mg/dL   BUN 16 6 - 20 mg/dL   Creatinine, Ser 1.13 0.61 - 1.24 mg/dL   Calcium 8.9 8.9 - 10.3 mg/dL   GFR calc non Af Amer >60 >60 mL/min   GFR calc Af Amer >60 >60 mL/min   Anion gap 8 5 - 15  CBC  Result Value Ref Range   WBC 6.3 4.0 - 10.5 K/uL   RBC 4.97 4.22 - 5.81 MIL/uL   Hemoglobin 14.6 13.0 - 17.0 g/dL   HCT 43.8 39.0 - 52.0 %   MCV 88.1 78.0 - 100.0 fL   MCH 29.4 26.0 - 34.0 pg   MCHC 33.3 30.0 - 36.0 g/dL   RDW 13.0 11.5 - 15.5 %   Platelets 198 150 - 400 K/uL  Troponin I  Result Value Ref Range   Troponin I <0.03 <0.031 ng/mL    Laboratory interpretation all normal except for hyperglycemia that he has had since May    Imaging Review Dg Chest 2 View  05/23/2015  CLINICAL DATA:  60 year old male with chest pain EXAM: CHEST  2 VIEW COMPARISON:  Chest radiograph dated 05/25/2014 FINDINGS: Two views of the chest do not demonstrate a focal consolidation. There is no pleural effusion or pneumothorax. There is mild eventration of the left hemidiaphragm. Left pectoral pacemaker device. Stable cardiac silhouette. Lower cervical/ upper thoracic fixation plate and screws. No acute fracture. IMPRESSION: No active cardiopulmonary disease. Electronically Signed   By: Anner Crete M.D.   On: 05/23/2015 23:35   I have personally reviewed and evaluated these images and lab results as part of my medical decision-making.   EKG Interpretation   Date/Time:  Sunday May 23 2015 22:33:32 EST Ventricular Rate:  73 PR Interval:  168 QRS Duration: 117 QT Interval:  560 QTC Calculation: 617 R Axis:   -40 Text Interpretation:  Atrial-sensed ventricular-paced  rhythm No further  analysis attempted due to paced rhythm No significant change since last  tracing 13 May 2013 Confirmed by [facility]  MD-I, Monnie Gudgel ([id number]) on 05/23/2015  11:06:11 PM      MDM   Final diagnoses:  Chest wall pain   New Prescriptions   CYCLOBENZAPRINE (FLEXERIL) 5 MG TABLET    Take 1 tablet (5 mg total) by mouth 3 (three) times daily as needed.   NAPROXEN (NAPROSYN) 250 MG TABLET    Take 1 po BID with food prn pain    Plan discharge  Rolland Porter, MD, FACEP   I personally performed the services described in this documentation, which was scribed in my presence. The recorded information has been reviewed and considered.  Rolland Porter, MD, Barbette Or, MD 05/24/15 [place]

## 2015-05-23 NOTE — ED Notes (Signed)
Pt and family updated on plan of care,  

## 2015-05-24 MED ORDER — NAPROXEN 250 MG PO TABS: ORAL_TABLET | ORAL | Status: DC

## 2015-05-24 MED ORDER — CYCLOBENZAPRINE HCL 5 MG PO TABS: 5.0000 mg | ORAL_TABLET | Freq: Three times a day (TID) | ORAL | Status: DC | PRN

## 2015-05-24 NOTE — ED Notes (Signed)
Nitro patch removed, pt tolerated well,

## 2015-05-24 NOTE — ED Notes (Signed)
Dr Knapp at bedside,  

## 2015-05-24 NOTE — Discharge Instructions (Signed)
Use ice and heat on your chest for comfort. Take the medications as prescribed.  Keep your appointment on the 8th with Dr Wolfgang Phoenix. Recheck if you feel worse instead of better.    Chest Wall Pain Chest wall pain is pain in or around the bones and muscles of your chest. Sometimes, an injury causes this pain. Sometimes, the cause may not be known. This pain may take several weeks or longer to get better. HOME CARE INSTRUCTIONS  Pay attention to any changes in your symptoms. Take these actions to help with your pain:   Rest as told by your health care provider.   Avoid activities that cause pain. These include any activities that use your chest muscles or your abdominal and side muscles to lift heavy items.   If directed, apply ice to the painful area:  Put ice in a plastic bag.  Place a towel between your skin and the bag.  Leave the ice on for 20 minutes, 2-3 times per day.  Take over-the-counter and prescription medicines only as told by your health care provider.  Do not use tobacco products, including cigarettes, chewing tobacco, and e-cigarettes. If you need help quitting, ask your health care provider.  Keep all follow-up visits as told by your health care provider. This is important. SEEK MEDICAL CARE IF:  You have a fever.  Your chest pain becomes worse.  You have new symptoms. SEEK IMMEDIATE MEDICAL CARE IF:  You have nausea or vomiting.  You feel sweaty or light-headed.  You have a cough with phlegm (sputum) or you cough up blood.  You develop shortness of breath.   This information is not intended to replace advice given to you by your health care provider. Make sure you discuss any questions you have with your health care provider.   Document Released: 04/03/2005 Document Revised: 12/23/2014 Document Reviewed: 06/29/2014 Elsevier Interactive Patient Education [employer].

## 2015-05-26 ENCOUNTER — Ambulatory Visit (INDEPENDENT_AMBULATORY_CARE_PROVIDER_SITE_OTHER): Payer: BLUE CROSS/BLUE SHIELD | Admitting: Nurse Practitioner

## 2015-05-26 ENCOUNTER — Encounter: Payer: Self-pay | Admitting: Nurse Practitioner

## 2015-05-26 VITALS — BP 138/94 | Temp 97.9°F | Ht 72.0 in | Wt 235.0 lb

## 2015-05-26 DIAGNOSIS — R5383 Other fatigue: Secondary | ICD-10-CM

## 2015-05-26 DIAGNOSIS — M791 Myalgia, unspecified site: Secondary | ICD-10-CM

## 2015-05-26 DIAGNOSIS — F419 Anxiety disorder, unspecified: Secondary | ICD-10-CM

## 2015-05-26 DIAGNOSIS — F430 Acute stress reaction: Secondary | ICD-10-CM

## 2015-05-26 DIAGNOSIS — F411 Generalized anxiety disorder: Secondary | ICD-10-CM

## 2015-05-26 DIAGNOSIS — R079 Chest pain, unspecified: Secondary | ICD-10-CM | POA: Diagnosis not present

## 2015-05-26 MED ORDER — ALPRAZOLAM 0.5 MG PO TABS
ORAL_TABLET | ORAL | Status: DC
Start: 2015-05-26 — End: 2015-06-24

## 2015-05-26 MED ORDER — ESCITALOPRAM OXALATE 10 MG PO TABS: 10.0000 mg | ORAL_TABLET | Freq: Every day | ORAL | Status: DC

## 2015-05-28 ENCOUNTER — Encounter: Payer: Self-pay | Admitting: Nurse Practitioner

## 2015-05-28 ENCOUNTER — Other Ambulatory Visit: Payer: Self-pay | Admitting: Family Medicine

## 2015-05-28 DIAGNOSIS — F411 Generalized anxiety disorder: Secondary | ICD-10-CM | POA: Insufficient documentation

## 2015-05-28 DIAGNOSIS — F430 Acute stress reaction: Secondary | ICD-10-CM

## 2015-05-28 NOTE — Progress Notes (Signed)
Subjective:  Presents for recheck after ED visit on 2/5 for chest pain. See previous note. Describes as a substernal pressure in his chest that occurs with and without activity. Some shortness of breath with exertion but unsure if this is due to lack of activity. Discomfort is fairly constant, worse at times. Minimal nausea, no vomiting. No difference in his symptoms from ED visit. No acid reflux or heartburn. No abdominal pain. Worse with stress. Working all the time. Stress level high. Out of desperation, patient has smoked marijuana a couple of times which calmed him down and relieved his chest pain. Would prefer not to have to do this. Has not followed up with his cardiologist in a long time. Has a pacemaker. Also complaints of difficulty sleeping and generalized muscle aches. Stays tired all the time. At end of visit mentions a tiny knot on the left medial knee area that is been there for while, no change in size. Only tender with palpation, no pain with any movement.  Objective:   BP 138/94 mmHg  Temp(Src) 97.9 F (36.6 C) (Oral)  Ht 6' (1.829 m)  Wt 235 lb (106.595 kg)  BMI 31.86 kg/m2 NAD. Alert, oriented. Fatigued in appearance. Lungs clear. Heart regular rate rhythm. Lower extremities no edema. Left knee no edema, no tenderness with ROM. A tiny mobile slightly firm cystic area noted on the left anterior medial aspect of the knee consistent with a ganglion cyst.  Assessment:  Problem List Items Addressed This Visit      Other   Anxiety as acute reaction to exceptional stress   Relevant Medications   escitalopram (LEXAPRO) 10 MG tablet   ALPRAZolam (XANAX) 0.5 MG tablet   Chest pain, exertional - Primary    Other Visit Diagnoses    Myalgia        Relevant Orders    VITAMIN D 25 Hydroxy (Vit-D Deficiency, Fractures)    Magnesium    CK (Creatine Kinase)    Other fatigue          EKG and troponin I normal at ED.  Plan:  Meds ordered this encounter  Medications  . escitalopram  (LEXAPRO) 10 MG tablet    Sig: Take 1 tablet (10 mg total) by mouth daily.    Dispense:  30 tablet    Refill:  0    Order Specific Question:  Supervising Provider    Answer:  Mikey Kirschner [[id number]]  . ALPRAZolam (XANAX) 0.5 MG tablet    Sig: take 1 tablet by mouth twice a day if needed FOR ANXIETY OR SLEEP    Dispense:  30 tablet    Refill:  2    Order Specific Question:  Supervising Provider    Answer:  Mikey Kirschner [[id number]]   Discussed importance of managing his stress and anxiety level. Feel this is most likely due to situational stress. Discussed options at length. Trial of Lexapro daily with Xanax half to 1 tab as needed for anxiety or sleep. DC Lexapro if any adverse effects. Cardiac warning signs reviewed. Continue current medications. Schedule follow-up with cardiologist in the near future for recheck. Patient to call or go to ED sooner if any problems. Return in about 1 month (around 06/23/2015) for recheck.

## 2015-05-31 ENCOUNTER — Ambulatory Visit: Payer: BLUE CROSS/BLUE SHIELD | Admitting: Adult Health

## 2015-06-01 ENCOUNTER — Ambulatory Visit: Payer: BLUE CROSS/BLUE SHIELD | Admitting: Urology

## 2015-06-01 ENCOUNTER — Encounter: Payer: Self-pay | Admitting: Adult Health

## 2015-06-01 ENCOUNTER — Other Ambulatory Visit: Payer: Self-pay | Admitting: *Deleted

## 2015-06-01 ENCOUNTER — Encounter: Payer: Self-pay | Admitting: *Deleted

## 2015-06-01 ENCOUNTER — Ambulatory Visit (INDEPENDENT_AMBULATORY_CARE_PROVIDER_SITE_OTHER): Payer: BLUE CROSS/BLUE SHIELD | Admitting: Adult Health

## 2015-06-01 VITALS — BP 140/88 | HR 67 | Ht 72.0 in | Wt 230.0 lb

## 2015-06-01 DIAGNOSIS — R079 Chest pain, unspecified: Secondary | ICD-10-CM | POA: Diagnosis not present

## 2015-06-01 DIAGNOSIS — I2510 Atherosclerotic heart disease of native coronary artery without angina pectoris: Secondary | ICD-10-CM

## 2015-06-01 DIAGNOSIS — I10 Essential (primary) hypertension: Secondary | ICD-10-CM | POA: Diagnosis not present

## 2015-06-01 MED ORDER — NITROGLYCERIN 0.4 MG SL SUBL: 0.4000 mg | SUBLINGUAL_TABLET | SUBLINGUAL | Status: DC | PRN

## 2015-06-01 NOTE — Progress Notes (Deleted)
Name: [name]    DOB: 10/04/1955  Age: 60 y.o.  MR#: [id number]       PCP:  Sallee Lange, MD      Insurance: Payor: [place] / Plan: BCBS OTHER / Product Type: *No Product type* /   CC:   No chief complaint on file.   VS Filed Vitals:   06/01/15 1430  BP: 140/88  Pulse: 67  Height: 6' (1.829 m)  Weight: 230 lb (104.327 kg)  SpO2: 96%    Weights Current Weight  06/01/15 230 lb (104.327 kg)  05/26/15 235 lb (106.595 kg)  05/23/15 230 lb (104.327 kg)    Blood Pressure  BP Readings from Last 3 Encounters:  06/01/15 140/88  05/26/15 138/94  05/24/15 155/78     Admit date:  (Not on file) Last encounter with RMR:  Visit date not found   Allergy Morphine and related and Latex  Current Outpatient Prescriptions  Medication Sig Dispense Refill  . ALPRAZolam (XANAX) 0.5 MG tablet take 1 tablet by mouth twice a day if needed FOR ANXIETY OR SLEEP 30 tablet 2  . aspirin 81 MG chewable tablet Chew 81 mg by mouth daily.    . cyclobenzaprine (FLEXERIL) 5 MG tablet Take 1 tablet (5 mg total) by mouth 3 (three) times daily as needed. 30 tablet 0  . escitalopram (LEXAPRO) 10 MG tablet Take 1 tablet (10 mg total) by mouth daily. 30 tablet 0  . HYDROcodone-acetaminophen (NORCO) 7.5-325 MG tablet Take 1 tablet by mouth every 6 (six) hours as needed. (Patient taking differently: Take 1 tablet by mouth every 6 (six) hours as needed for moderate pain or severe pain. ) 30 tablet 0  . losartan (COZAAR) 100 MG tablet Take 1 tablet (100 mg total) by mouth daily. 90 tablet 3  . nadolol (CORGARD) 20 MG tablet Take 1 tablet (20 mg total) by mouth daily. 90 tablet 3  . naproxen (NAPROSYN) 250 MG tablet Take 1 po BID with food prn pain 30 tablet 0  . nitroGLYCERIN (NITROSTAT) 0.4 MG SL tablet Place 0.4 mg under the tongue every 5 (five) minutes as needed for chest pain (MAX 3 TABLETS).     . ondansetron (ZOFRAN) 8 MG tablet Take 1 tablet (8 mg total) by mouth every 8 (eight) hours as  needed for nausea. (Patient taking differently: Take 8 mg by mouth daily as needed for nausea. ) 20 tablet 2  . pantoprazole (PROTONIX) 40 MG tablet take 1 tablet by mouth once daily 15 tablet 0  . simvastatin (ZOCOR) 10 MG tablet take 1 tablet by mouth EVERY EVENING AT 6PM 30 tablet 6  . tamsulosin (FLOMAX) 0.4 MG CAPS capsule take 2 capsules by mouth once daily 30 capsule 0   No current facility-administered medications for this visit.    Discontinued Meds:   There are no discontinued medications.  Patient Active Problem List   Diagnosis Date Noted  . Anxiety as acute reaction to exceptional stress 05/28/2015  . OSA (obstructive sleep apnea) 10/16/2013  . Obstructive sleep apnea 08/25/2013  . BPH (benign prostatic hyperplasia) 08/25/2013  . Thoracic back pain 08/25/2013  . Chest pain, exertional 12/26/2012  . Intermediate coronary syndrome ([place]) 12/17/2012  . Bradycardia 12/17/2012  . Mobitz type II atrioventricular block 12/17/2012  . Coronary atherosclerosis of native coronary artery 10/18/2012  . S/P drug eluting coronary stent placement 10/18/2012  . Exertional dyspnea 12/26/2011  . Abdominal  pain 12/26/2011  . Hypertension   .  ADD (attention deficit disorder)   . Palpitations   . GERD (gastroesophageal reflux disease) 12/20/2011  . Hx of adenomatous colonic polyps 12/20/2011    LABS    Component Value Date/Time   NA 141 05/23/2015 2309   NA 144 12/14/2014 0855   NA 139 08/19/2014 1559   NA 141 10/08/2013 1230   K 3.9 05/23/2015 2309   K 5.0 12/14/2014 0855   K 3.9 08/19/2014 1559   CL 105 05/23/2015 2309   CL 102 12/14/2014 0855   CL 101 08/19/2014 1559   CO2 28 05/23/2015 2309   CO2 22 12/14/2014 0855   CO2 28 08/19/2014 1559   GLUCOSE 150* 05/23/2015 2309   GLUCOSE 105* 12/14/2014 0855   GLUCOSE 113* 08/19/2014 1559   GLUCOSE 91 10/08/2013 1230   BUN 16 05/23/2015 2309   BUN 15 12/14/2014 0855   BUN 13 08/19/2014 1559   BUN 19 10/08/2013 1230    CREATININE 1.13 05/23/2015 2309   CREATININE 1.20 01/27/2015 1539   CREATININE 1.28* 12/14/2014 0855   CREATININE 0.89 08/19/2014 1559   CREATININE 1.19 04/19/2012 1405   CALCIUM 8.9 05/23/2015 2309   CALCIUM 9.6 12/14/2014 0855   CALCIUM 9.1 08/19/2014 1559   GFRNONAA >60 05/23/2015 2309   GFRNONAA 60 12/14/2014 0855   GFRNONAA 78* 05/13/2013 1051   GFRAA >60 05/23/2015 2309   GFRAA 70 12/14/2014 0855   GFRAA >90 05/13/2013 1051   CMP     Component Value Date/Time   NA 141 05/23/2015 2309   NA 144 12/14/2014 0855   K 3.9 05/23/2015 2309   CL 105 05/23/2015 2309   CO2 28 05/23/2015 2309   GLUCOSE 150* 05/23/2015 2309   GLUCOSE 105* 12/14/2014 0855   BUN 16 05/23/2015 2309   BUN 15 12/14/2014 0855   CREATININE 1.13 05/23/2015 2309   CREATININE 0.89 08/19/2014 1559   CALCIUM 8.9 05/23/2015 2309   PROT 6.7 10/08/2013 1230   ALBUMIN 4.4 10/08/2013 1230   AST 21 10/08/2013 1230   ALT 28 10/08/2013 1230   ALKPHOS 64 10/08/2013 1230   BILITOT 0.6 10/08/2013 1230   GFRNONAA >60 05/23/2015 2309   GFRAA >60 05/23/2015 2309       Component Value Date/Time   WBC 6.3 05/23/2015 2309   WBC 6.3 12/14/2014 0855   WBC 7.6 10/08/2013 1230   WBC 7.2 06/04/2013 1315   HGB 14.6 05/23/2015 2309   HGB 15.1 10/08/2013 1230   HGB 15.5 06/04/2013 1315   HCT 43.8 05/23/2015 2309   HCT 47.8 12/14/2014 0855   HCT 44.8 10/08/2013 1230   HCT 47.4 06/04/2013 1315   MCV 88.1 05/23/2015 2309   MCV 88 12/14/2014 0855   MCV 86.5 10/08/2013 1230   MCV 87.9 06/04/2013 1315    Lipid Panel     Component Value Date/Time   CHOL 134 12/14/2014 0855   CHOL 135 12/17/2012 0215   TRIG 104 12/14/2014 0855   HDL 48 12/14/2014 0855   HDL 53 12/17/2012 0215   CHOLHDL 2.8 12/14/2014 0855   CHOLHDL 2.5 12/17/2012 0215   VLDL 9 12/17/2012 0215   LDLCALC 65 12/14/2014 0855   LDLCALC 73 12/17/2012 0215    ABG    Component Value Date/Time   TCO2 28 08/16/2011 0107     Lab Results  Component  Value Date   TSH 0.99 10/08/2013   BNP (last 3 results) No results for input(s): BNP in the last 8760 hours.  ProBNP (last 3 results) No  results for input(s): PROBNP in the last 8760 hours.  Cardiac Panel (last 3 results) No results for input(s): CKTOTAL, CKMB, TROPONINI, RELINDX in the last 72 hours.  Iron/TIBC/Ferritin/ %Sat No results found for: IRON, TIBC, FERRITIN, IRONPCTSAT   EKG Orders placed or performed during the hospital encounter of 05/23/15  . EKG 12-Lead  . EKG 12-Lead  . ED EKG within 10 minutes  . ED EKG within 10 minutes  . EKG     Prior Assessment and Plan Problem List as of 06/01/2015      Cardiovascular and Mediastinum   Hypertension   Last Assessment & Plan 08/05/2014 Office Visit Edited 08/05/2014  2:00 PM by Imogene Burn, PA-C    Blood pressure is still significantly elevated and he feels terrible. Will increase losartan to 100 mg daily and add HCTZ 25 mg once daily. He is to come back for blood pressure check in one week and bmet in 2 weeks. Follow-up with Dr. Bronson Ing in 2 weeks. Recommend follow-up with ophthalmologist. Patient instructed on 2 g sodium diet.      Coronary atherosclerosis of native coronary artery   Last Assessment & Plan 08/05/2014 Office Visit Written 08/05/2014  1:59 PM by Imogene Burn, PA-C    Repeat cath showed patent RCA and follow-up cath in 05/2013. Patient continues to have exertional chest pain since pacer insertion. No improvement with second pacer placed at [facility].      Intermediate coronary syndrome [employer]   Mobitz type II atrioventricular block     Respiratory   Obstructive sleep apnea   OSA (obstructive sleep apnea)   Last Assessment & Plan 11/26/2013 Office Visit Written 11/26/2013  4:59 PM by Kathee Delton, MD    The patient has mild to moderate OSA by his recent sleep study, but does not feel that he is overly symptomatic (though he is symptomatic to some degree).  This degree of sleep apnea does not  represent a significant risk to his cardiovascular health, and therefore I have outlined a conservative path with a trial of weight loss alone versus more aggressive treatment with a dental appliance or CPAP. The patient feels that he is very motivated to lose weight, and would like to take the next 6 months to work on this. If he is not successful, he would consider a dental appliance.        Digestive   GERD (gastroesophageal reflux disease)     Genitourinary   BPH (benign prostatic hyperplasia)     Other   Hx of adenomatous colonic polyps   ADD (attention deficit disorder)   Palpitations   Last Assessment & Plan 11/01/2012 Office Visit Written 11/01/2012  1:41 PM by Lendon Colonel, NP    He continues to wear cardiac monitor.  Will remove after another week. He will follow up in our office in one month.      Exertional dyspnea   Last Assessment & Plan 12/26/2011 Office Visit Edited 12/29/2011 10:04 AM by Yehuda Savannah, MD    Exertional dyspnea likely related to excessive weight and physical deconditioning.  Graded exercise testing to be performed to evaluate exercise tolerance, exclude exercise hypoxemia and to exclude myocardial ischemia.       Abdominal  pain   S/P drug eluting coronary stent placement   Bradycardia   Chest pain, exertional   Last Assessment & Plan 12/26/2012 Office Visit Edited 12/26/2012  4:40 PM by Deboraha Sprang, MD    The patient has  exertional chest discomfort and shortness of breath that is new following pacemaker implantation  There are some new medications.  Echocardiogram today demonstrates no effusion neck veins are flat excluding pericardial effusion. There is no rub or is a possibility.  The other thing with 2 hospitalizations for pulmonary embolism. If this is potentially life-threatening, will undertake a CAT scan today. Creatinine last week or so was less than 1.0.  CT scan was negative.  The other thing is could this be medication issue; I  suspect this is unlikely but we will plan to put him back on his pre-seizure medications which he took for his blood pressure which he tolerated better.      Thoracic back pain   Anxiety as acute reaction to exceptional stress       Imaging: Dg Chest 2 View  05/23/2015  CLINICAL DATA:  60 year old male with chest pain EXAM: CHEST  2 VIEW COMPARISON:  Chest radiograph dated 05/25/2014 FINDINGS: Two views of the chest do not demonstrate a focal consolidation. There is no pleural effusion or pneumothorax. There is mild eventration of the left hemidiaphragm. Left pectoral pacemaker device. Stable cardiac silhouette. Lower cervical/ upper thoracic fixation plate and screws. No acute fracture. IMPRESSION: No active cardiopulmonary disease. Electronically Signed   By: Anner Crete M.D.   On: 05/23/2015 23:35

## 2015-06-01 NOTE — Progress Notes (Signed)
Cardiology Office Note   Date:  06/01/2015   ID:  [name], [name] [birth year], MRN [name]  PCP:  [name] Lange, MD  Cardiologist:   [name] Sims, NP   No chief complaint on file.     History of Present Illness: [name] is a 60 y.o. male who presents for ongoing assessment and management of CAD, (DES to RCA 2014) sick sinus syndrome, status post pacemaker implantation,hypertension,with multiple medical problems.  The patient was last seen in the office in August of 2016 by Dr. Bronson [name]. At that time.he was symptomatically stable, continued on aspirin, and simvastatin.  Plavix was discontinued.  He was to continue with pacemaker interrogation per protocol.  He was seen by primary care provider, [name] Forster, NP, on 05/28/2015 after a post ER evaluation for chest pain.  He described it as substernal pressure in the chest that occurs with and without activity, associated shortness of breath with exertion, but he was unsure if this was due to lack of activity.  The patient states that he was under a lot of stress and occasionally smokes marijuana to reduce his anxiety. He  referred for reevaluation of chest pain..  Most recent nuclear medicine stress test was completed on 11/09/2006, which was negative for ischemia.  Nuclear scan revealed normal perfusion with soft tissue attenuation inferiorly.  He states that when he is walking on a treadmill and began to have chest pain, substernal, radiating across the right and the left.  He usually walks on a treadmill in order to get a mile completed.  Each day.  He has had to stop doing that due to recurrent chest pain.  He has not taken any nitroglycerin sublingual.  He states, that he is not really a medicine person.  He will only take what is necessary, and he has been taking his blood pressure medication, and it will all as directed.  He is concerned knowing that he has a stent in the right coronary artery and if this is becoming an  issue again.  Past Medical History  Diagnosis Date  . Hypertension   . GERD (gastroesophageal reflux disease)   . Colon polyps 2011    tubular adenoma by excisional biopsy during colonoscopy  . ADD (attention deficit disorder)     Rx-Adderall  . Degenerative disc disease, cervical   . Palpitations 2003    Holter in 2003: PACs and PVCs; negative stress nuclear test in 2005; right bundle branch block; echo in 2008-mild LVH, otherwise normal; 2008-negative stress nuclear test.  . Prostatitis     prostate calcifications by CT  . Sleep apnea     questionable diagnosis  . Pneumonia   . Anginal pain ([place])   . Complete heart block ([place])   . Coronary artery disease   . AV block, 3rd degree [facility])     Past Surgical History  Procedure Laterality Date  . Anterior cervical decomp/discectomy fusion    . Knee surgery      rt  . Colonoscopy w/ polypectomy  2011    tubular adenoma  . Esophagogastroduodenoscopy      Gastritis  . Pacemaker insertion  12/17/12    MDT Adapta L implanted by Dr Rayann Heman for mobitz II second degree AV block  . Left heart catheterization with coronary angiogram N/A 10/17/2012    Procedure: LEFT HEART CATHETERIZATION WITH CORONARY ANGIOGRAM;  Surgeon: Josue Hector, MD;  Location: [facility] CATH LAB;  Service: Cardiovascular;  Laterality: N/A;  . Percutaneous coronary stent  intervention (pci-s)  10/17/2012    Procedure: PERCUTANEOUS CORONARY STENT INTERVENTION (PCI-S);  Surgeon: Josue Hector, MD;  Location: [facility] CATH LAB;  Service: Cardiovascular;;  . Left heart catheterization with coronary angiogram N/A 12/17/2012    Procedure: LEFT HEART CATHETERIZATION WITH CORONARY ANGIOGRAM;  Surgeon: Peter M Martinique, MD;  Location: [facility] CATH LAB;  Service: Cardiovascular;  Laterality: N/A;  . Permanent pacemaker insertion N/A 12/17/2012    Procedure: PERMANENT PACEMAKER INSERTION;  Surgeon: Thompson Grayer, MD;  Location: [facility] CATH LAB;  Service: Cardiovascular;  Laterality: N/A;  . Left heart  catheterization with coronary angiogram N/A 06/11/2013    Procedure: LEFT HEART CATHETERIZATION WITH CORONARY ANGIOGRAM;  Surgeon: Wellington Hampshire, MD;  Location: [facility] CATH LAB;  Service: Cardiovascular;  Laterality: N/A;  . Biventricular pacemaker upgrade/ system revision  04/02/14    removal of previous atrial and ventricular leads with placement of a new MDT Consulta CRT-P system at [facility] by Dr [facility]     Current Outpatient Prescriptions  Medication Sig Dispense Refill  . ALPRAZolam (XANAX) 0.5 MG tablet take 1 tablet by mouth twice a day if needed FOR ANXIETY OR SLEEP 30 tablet 2  . aspirin 81 MG chewable tablet Chew 81 mg by mouth daily.    . cyclobenzaprine (FLEXERIL) 5 MG tablet Take 1 tablet (5 mg total) by mouth 3 (three) times daily as needed. 30 tablet 0  . escitalopram (LEXAPRO) 10 MG tablet Take 1 tablet (10 mg total) by mouth daily. 30 tablet 0  . HYDROcodone-acetaminophen (NORCO) 7.5-325 MG tablet Take 1 tablet by mouth every 6 (six) hours as needed. (Patient taking differently: Take 1 tablet by mouth every 6 (six) hours as needed for moderate pain or severe pain. ) 30 tablet 0  . losartan (COZAAR) 100 MG tablet Take 1 tablet (100 mg total) by mouth daily. 90 tablet 3  . nadolol (CORGARD) 20 MG tablet Take 1 tablet (20 mg total) by mouth daily. 90 tablet 3  . naproxen (NAPROSYN) 250 MG tablet Take 1 po BID with food prn pain 30 tablet 0  . nitroGLYCERIN (NITROSTAT) 0.4 MG SL tablet Place 0.4 mg under the tongue every 5 (five) minutes as needed for chest pain (MAX 3 TABLETS).     . ondansetron (ZOFRAN) 8 MG tablet Take 1 tablet (8 mg total) by mouth every 8 (eight) hours as needed for nausea. (Patient taking differently: Take 8 mg by mouth daily as needed for nausea. ) 20 tablet 2  . pantoprazole (PROTONIX) 40 MG tablet take 1 tablet by mouth once daily 15 tablet 0  . simvastatin (ZOCOR) 10 MG tablet take 1 tablet by mouth EVERY EVENING AT 6PM 30 tablet 6  .  tamsulosin (FLOMAX) 0.4 MG CAPS capsule take 2 capsules by mouth once daily 30 capsule 0   No current facility-administered medications for this visit.    Allergies:   Morphine and related and Latex    Social History:  The patient  reports that he quit smoking about 11 years ago. His smoking use included Cigarettes. He started smoking about 48 years ago. He has a 100 pack-year smoking history. He has quit using smokeless tobacco. He reports that he drinks about 0.6 oz of alcohol per week. He reports that he does not use illicit drugs.   Family History:  The patient's family history includes Heart disease in his mother; Hypertension in his mother.    ROS: All other systems are reviewed and negative. Unless otherwise mentioned  in H&P    PHYSICAL EXAM: VS:  BP 140/88 mmHg  Pulse 67  Ht 6' (1.829 m)  Wt 230 lb (104.327 kg)  BMI 31.19 kg/m2  SpO2 96% , BMI Body mass index is 31.19 kg/(m^2). GEN: Well nourished, well developed, in no acute distress HEENT: normal Neck: no JVD, carotid bruits, or masses Cardiac:RRR; 1/6 systolic murmur,  no, rubs, or gallops,no edema  Respiratory:  Clear to auscultation bilaterally, normal work of breathing GI: soft, nontender, nondistended, + BS MS: no deformity or atrophy Skin: warm and dry, no rash Neuro:  Strength and sensation are intact Psych: euthymic mood, full affect   Recent Labs: 05/23/2015: BUN 16; Creatinine, Ser 1.13; Hemoglobin 14.6; Platelets 198; Potassium 3.9; Sodium 141    Lipid Panel    Component Value Date/Time   CHOL 134 12/14/2014 0855   CHOL 135 12/17/2012 0215   TRIG 104 12/14/2014 0855   HDL 48 12/14/2014 0855   HDL 53 12/17/2012 0215   CHOLHDL 2.8 12/14/2014 0855   CHOLHDL 2.5 12/17/2012 0215   VLDL 9 12/17/2012 0215   LDLCALC 65 12/14/2014 0855   LDLCALC 73 12/17/2012 0215      Wt Readings from Last 3 Encounters:  06/01/15 230 lb (104.327 kg)  05/26/15 235 lb (106.595 kg)  05/23/15 230 lb (104.327 kg)      ASSESSMENT AND PLAN:  1.  CAD: Status post drug-eluting stent to the right coronary artery 2014:he is having recurrent exertional chest pain, usually when walking on the treadmill.  He usually likes to walk until he completes 1 mile, but he has had stopped this as the discomfort in his chest has gotten worse.  Since stopping, he has not had any recurrent chest pain.  He was seen in the emergency room and was told that he had chest wall pain.  He was placed on NSAIDs.  I will plan a Cardiolite exercise stress test for diagnostic prognostic purposes.  Most recent cardiac catheterization in 2014 did reveal a patent stent to the right coronary artery, normal left main, LAD, diagonals, circumflex, and its branches.  This has been discussed with the patient and to wishes to proceed with the test.  I have asked him to hold his [place] the day of his stress test.  He verbalizes understanding.  We will see the patient post stress test for further discussion of results. I have asked him to refill nitroglycerin to use if he ends up having unstable angina.  2. Hypertension:he states that his blood pressure is normally much higher than the 140/88.  That we are reading today.  He states that it normally goes up to [id number] systolically.  This may be also part of his chest discomfort.  If blood pressure is elevated.  If we notice this on the stress test we may need to add an additional medication to have better blood pressure control.   3. Pacemaker in situ: His pacemaker was last checked remotely in December of 2016.  He will continue to see Dr. Rayann Heman per protocol with ongoing interrogation.  Current medicines are reviewed at length with the patient today.    Labs/ tests ordered today include: Cardiolite stress test    Disposition:   FU with post stress test for discussion of test results.   Signed, [name] Sims, NP  06/01/2015 2:36 PM    [facility] [address]  [place], [place] [address] Phone: [phone]; Fax: [phone]

## 2015-06-01 NOTE — Patient Instructions (Signed)
Your physician recommends that you schedule a follow-up appointment in: After your Stress test  Your physician recommends that you continue on your current medications as directed. Please refer to the Current Medication list given to you today.  If you need a refill on your cardiac medications before your next appointment, please call your pharmacy.  Your physician has requested that you have en exercise stress myoview. For further information please visit [email or web address]. Please follow instruction sheet, as given.  Thank you for choosing [facility]!

## 2015-06-03 ENCOUNTER — Other Ambulatory Visit: Payer: Self-pay | Admitting: Family Medicine

## 2015-06-04 ENCOUNTER — Telehealth: Payer: Self-pay | Admitting: Family Medicine

## 2015-06-04 MED ORDER — TAMSULOSIN HCL 0.4 MG PO CAPS: 0.8000 mg | ORAL_CAPSULE | Freq: Every day | ORAL | Status: DC

## 2015-06-04 NOTE — Telephone Encounter (Signed)
Pt is needing a refill on his flomax. Pt has an appt with Hoyle Sauer the beginning of March. Pt is completely out.     [facility]

## 2015-06-04 NOTE — Telephone Encounter (Signed)
Rx sent electronically to pharmacy. Patient notified. 

## 2015-06-24 ENCOUNTER — Ambulatory Visit (INDEPENDENT_AMBULATORY_CARE_PROVIDER_SITE_OTHER): Payer: BLUE CROSS/BLUE SHIELD | Admitting: Nurse Practitioner

## 2015-06-24 ENCOUNTER — Encounter: Payer: Self-pay | Admitting: Nurse Practitioner

## 2015-06-24 VITALS — BP 148/102 | Ht 72.0 in | Wt 235.0 lb

## 2015-06-24 DIAGNOSIS — N400 Enlarged prostate without lower urinary tract symptoms: Secondary | ICD-10-CM

## 2015-06-24 MED ORDER — HYDROCODONE-ACETAMINOPHEN 10-325 MG PO TABS: 1.0000 | ORAL_TABLET | Freq: Three times a day (TID) | ORAL | Status: DC | PRN

## 2015-06-24 MED ORDER — TAMSULOSIN HCL 0.4 MG PO CAPS: 0.8000 mg | ORAL_CAPSULE | Freq: Every day | ORAL | Status: DC

## 2015-06-24 NOTE — Progress Notes (Signed)
Subjective:  Presents for recheck. Has a stress test planned for next week. Did not take Xanax or Lexapro, has been able to rearrange some things in his life to decrease his stress. Denies need for medication at this time. Requesting a refill of his Flomax, takes 2 of the 0.4 mg capsules per day which works well. Also requesting a refill on his hydrocodone, takes this very rarely, last prescription was in November. Would like to increase the dose to see if this will help more, has a strong tolerance to pain.  Objective:   BP 148/102 mmHg  Ht 6' (1.829 m)  Wt 235 lb (106.595 kg)  BMI 31.86 kg/m2 NAD. Alert, oriented. Lungs clear. Heart regular rate rhythm.  Assessment:  Problem List Items Addressed This Visit      Genitourinary   BPH (benign prostatic hyperplasia) - Primary   Relevant Medications   tamsulosin (FLOMAX) 0.4 MG CAPS capsule     Plan:  Meds ordered this encounter  Medications  . tamsulosin (FLOMAX) 0.4 MG CAPS capsule    Sig: Take 2 capsules (0.8 mg total) by mouth daily.    Dispense:  60 capsule    Refill:  5    Order Specific Question:  Supervising Provider    Answer:  Mikey Kirschner [[id number]]  . HYDROcodone-acetaminophen (NORCO) 10-325 MG tablet    Sig: Take 1 tablet by mouth every 8 (eight) hours as needed.    Dispense:  30 tablet    Refill:  0    Order Specific Question:  Supervising Provider    Answer:  Mikey Kirschner [[id number]]   Refills on his Flomax. Given a one-time prescription for pain med. Follow-up with cardiology as planned. Otherwise routine follow-up here.

## 2015-06-30 ENCOUNTER — Encounter (HOSPITAL_COMMUNITY)
Admission: RE | Admit: 2015-06-30 | Discharge: 2015-06-30 | Disposition: A | Discharge status: Home / Self Care | Payer: BLUE CROSS/BLUE SHIELD | Source: Ambulatory Visit | Attending: Adult Health | Admitting: Adult Health

## 2015-06-30 ENCOUNTER — Other Ambulatory Visit: Payer: Self-pay | Admitting: Family Medicine

## 2015-06-30 ENCOUNTER — Inpatient Hospital Stay (HOSPITAL_COMMUNITY): Admission: RE | Admit: 2015-06-30 | Payer: BLUE CROSS/BLUE SHIELD | Source: Ambulatory Visit

## 2015-06-30 ENCOUNTER — Encounter (HOSPITAL_COMMUNITY): Payer: Self-pay

## 2015-06-30 DIAGNOSIS — R079 Chest pain, unspecified: Secondary | ICD-10-CM | POA: Insufficient documentation

## 2015-06-30 LAB — NM MYOCAR MULTI W/SPECT W/WALL MOTION / EF
Estimated workload: 10.8 METS
Exercise duration (min): 10 min
Exercise duration (sec): 6 s
LV dias vol: 90 mL (ref 62–150)
LV sys vol: 38 mL
MPHR: 160 {beats}/min
Peak HR: 144 {beats}/min
Percent HR: 90 %
RATE: 0.35
RPE: 14
Rest HR: 63 {beats}/min
SDS: 1
SRS: 2
SSS: 3
TID: 1.09

## 2015-06-30 MED ORDER — SODIUM CHLORIDE 0.9% FLUSH
INTRAVENOUS | Status: AC
  Administered 2015-06-30: 10 mL via INTRAVENOUS
  Filled 2015-06-30: qty 10

## 2015-06-30 MED ORDER — REGADENOSON 0.4 MG/5ML IV SOLN
INTRAVENOUS | Status: AC
  Filled 2015-06-30: qty 5

## 2015-06-30 MED ORDER — TECHNETIUM TC 99M SESTAMIBI GENERIC - CARDIOLITE
10.0000 | Freq: Once | INTRAVENOUS | Status: AC | PRN
  Administered 2015-06-30: 10 via INTRAVENOUS

## 2015-06-30 MED ORDER — TECHNETIUM TC 99M SESTAMIBI - CARDIOLITE
30.0000 | Freq: Once | INTRAVENOUS | Status: AC | PRN
  Administered 2015-06-30: 30 via INTRAVENOUS

## 2015-07-01 ENCOUNTER — Telehealth: Payer: Self-pay | Admitting: *Deleted

## 2015-07-01 NOTE — Telephone Encounter (Signed)
Called patient with test results. No answer. Unable to leave message.  

## 2015-07-01 NOTE — Telephone Encounter (Signed)
-----   Message from Lendon Colonel, NP sent at 07/01/2015 10:45 AM EDT ----- No evidence of ischemia Low risk test. No changes

## 2015-07-05 ENCOUNTER — Telehealth: Payer: Self-pay | Admitting: *Deleted

## 2015-07-05 NOTE — Telephone Encounter (Signed)
Called patient with test results. No answer. Left message to call back.  

## 2015-07-05 NOTE — Telephone Encounter (Signed)
-----   Message from Lendon Colonel, NP sent at 07/01/2015 10:45 AM EDT ----- No evidence of ischemia Low risk test. No changes

## 2015-07-09 ENCOUNTER — Other Ambulatory Visit: Payer: Self-pay | Admitting: Nurse Practitioner

## 2015-08-04 ENCOUNTER — Other Ambulatory Visit: Payer: Self-pay | Admitting: Family Medicine

## 2015-08-09 ENCOUNTER — Other Ambulatory Visit: Payer: Self-pay | Admitting: Physician Assistant

## 2015-09-03 ENCOUNTER — Other Ambulatory Visit: Payer: Self-pay | Admitting: Family Medicine

## 2015-09-11 ENCOUNTER — Other Ambulatory Visit: Payer: Self-pay | Admitting: Cardiovascular Disease

## 2015-10-13 ENCOUNTER — Encounter: Payer: Self-pay | Admitting: Family Medicine

## 2015-10-13 ENCOUNTER — Ambulatory Visit (INDEPENDENT_AMBULATORY_CARE_PROVIDER_SITE_OTHER): Payer: BLUE CROSS/BLUE SHIELD | Admitting: Family Medicine

## 2015-10-13 VITALS — BP 132/90 | Temp 97.8°F | Ht 72.0 in | Wt 235.4 lb

## 2015-10-13 DIAGNOSIS — S39012A Strain of muscle, fascia and tendon of lower back, initial encounter: Secondary | ICD-10-CM

## 2015-10-13 MED ORDER — MUPIROCIN 2 % EX OINT: TOPICAL_OINTMENT | CUTANEOUS | Status: DC

## 2015-10-13 MED ORDER — OXYCODONE-ACETAMINOPHEN 10-325 MG PO TABS: 1.0000 | ORAL_TABLET | ORAL | Status: DC | PRN

## 2015-10-13 MED ORDER — PREDNISONE 20 MG PO TABS: ORAL_TABLET | ORAL | Status: DC

## 2015-10-13 NOTE — Progress Notes (Signed)
   Subjective:    Patient ID: [name], male    DOB: 11/06/1955, 60 y.o.   MRN: [id number]  Back Pain This is a new problem. The current episode started in the past 7 days. The problem occurs intermittently. The problem is unchanged. The pain is present in the lumbar spine. The quality of the pain is described as aching. The pain is moderate. The pain is the same all the time. Stiffness is present all day. He has tried heat, ice and muscle relaxant (pain pills) for the symptoms. The treatment provided no relief.   Patient has nose pain also.  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  No loss of bowel or bladder     Objective:   Physical Exam  Lungs clear heart rate controlled lower back moderate tenderness in the left lower back negative straight leg raise reflexes good fairly decent range of motion      Assessment & Plan:  Sacroiliac strain patient will do stretching exercises prednisone taper then we'll try anti-inflammatory pain medication prescribed caution drowsiness if not progressively better over the next 2 weeks referral to physical therapy. Follow-up if problems. Recent PSA normal. Doubt cancer.

## 2015-11-27 ENCOUNTER — Other Ambulatory Visit: Payer: Self-pay | Admitting: Cardiovascular Disease

## 2015-12-27 ENCOUNTER — Other Ambulatory Visit: Payer: Self-pay | Admitting: Nurse Practitioner

## 2016-01-04 ENCOUNTER — Encounter: Payer: Self-pay | Admitting: Family Medicine

## 2016-01-04 ENCOUNTER — Encounter: Payer: Self-pay | Admitting: Gastroenterology

## 2016-01-04 ENCOUNTER — Ambulatory Visit (INDEPENDENT_AMBULATORY_CARE_PROVIDER_SITE_OTHER): Payer: BLUE CROSS/BLUE SHIELD | Admitting: Family Medicine

## 2016-01-04 VITALS — BP 120/86 | Ht 71.0 in | Wt 225.4 lb

## 2016-01-04 DIAGNOSIS — Z125 Encounter for screening for malignant neoplasm of prostate: Secondary | ICD-10-CM | POA: Diagnosis not present

## 2016-01-04 DIAGNOSIS — Z1211 Encounter for screening for malignant neoplasm of colon: Secondary | ICD-10-CM

## 2016-01-04 DIAGNOSIS — Z0000 Encounter for general adult medical examination without abnormal findings: Secondary | ICD-10-CM

## 2016-01-04 DIAGNOSIS — Z79899 Other long term (current) drug therapy: Secondary | ICD-10-CM

## 2016-01-04 DIAGNOSIS — E785 Hyperlipidemia, unspecified: Secondary | ICD-10-CM

## 2016-01-04 DIAGNOSIS — R739 Hyperglycemia, unspecified: Secondary | ICD-10-CM

## 2016-01-04 DIAGNOSIS — N400 Enlarged prostate without lower urinary tract symptoms: Secondary | ICD-10-CM

## 2016-01-04 DIAGNOSIS — I10 Essential (primary) hypertension: Secondary | ICD-10-CM | POA: Diagnosis not present

## 2016-01-04 NOTE — Progress Notes (Signed)
   Subjective:    Patient ID: [name], male    DOB: 1955/07/20, 60 y.o.   MRN: [id number]  HPI The patient comes in today for a wellness visit.    A review of their health history was completed.  A review of medications was also completed.  Any needed refills: none  Eating habits: good   Falls/  MVA accidents in past few months: none  Regular exercise: yes  Specialist pt sees on regular basis: none  Preventative health issues were discussed.   Additional concerns: family history of prostate cancer   Review of Systems  Constitutional: Negative for activity change, appetite change and fever.  HENT: Negative for congestion and rhinorrhea.   Eyes: Negative for discharge.  Respiratory: Negative for cough and wheezing.   Cardiovascular: Negative for chest pain.  Gastrointestinal: Negative for abdominal pain, blood in stool and vomiting.  Genitourinary: Negative for difficulty urinating and frequency.  Musculoskeletal: Negative for neck pain.  Skin: Negative for rash.  Allergic/Immunologic: Negative for environmental allergies and food allergies.  Neurological: Negative for weakness and headaches.  Psychiatric/Behavioral: Negative for agitation.       Objective:   Physical Exam  Constitutional: He appears well-developed and well-nourished.  HENT:  Head: Normocephalic and atraumatic.  Right Ear: External ear normal.  Left Ear: External ear normal.  Nose: Nose normal.  Mouth/Throat: Oropharynx is clear and moist.  Eyes: EOM are normal. Pupils are equal, round, and reactive to light.  Neck: Normal range of motion. Neck supple. No thyromegaly present.  Cardiovascular: Normal rate, regular rhythm and normal heart sounds.   No murmur heard. Pulmonary/Chest: Effort normal and breath sounds normal. No respiratory distress. He has no wheezes.  Abdominal: Soft. Bowel sounds are normal. He exhibits no distension and no mass. There is no tenderness.  Genitourinary:  Penis normal.  Musculoskeletal: Normal range of motion. He exhibits no edema.  Lymphadenopathy:    He has no cervical adenopathy.  Neurological: He is alert. He exhibits normal muscle tone.  Skin: Skin is warm and dry. No erythema.  Psychiatric: He has a normal mood and affect. His behavior is normal. Judgment normal.   Prostate exam normal Brother recently diagnosed with prostate cancer       Assessment & Plan:  HTN continue current measures watch diet stay active Reflux pain to resolve helps continue this PSA ordered Patient needs colonoscopy referral made Patient with history hyperglycemia watch diet exercise lose weight check [id number] and metabolic 7 Safety dietary all discussed Follow-up if any ongoing troubles Recheck within 6 months time

## 2016-01-06 DIAGNOSIS — R739 Hyperglycemia, unspecified: Secondary | ICD-10-CM | POA: Diagnosis not present

## 2016-01-06 DIAGNOSIS — Z125 Encounter for screening for malignant neoplasm of prostate: Secondary | ICD-10-CM | POA: Diagnosis not present

## 2016-01-06 DIAGNOSIS — E785 Hyperlipidemia, unspecified: Secondary | ICD-10-CM | POA: Diagnosis not present

## 2016-01-06 DIAGNOSIS — Z79899 Other long term (current) drug therapy: Secondary | ICD-10-CM | POA: Diagnosis not present

## 2016-01-07 LAB — LIPID PANEL
Chol/HDL Ratio: 2.9 ratio units (ref 0.0–5.0)
Cholesterol, Total: 128 mg/dL (ref 100–199)
HDL: 44 mg/dL (ref >39)
LDL Calculated: 63 mg/dL (ref 0–99)
Triglycerides: 103 mg/dL (ref 0–149)
VLDL Cholesterol Cal: 21 mg/dL (ref 5–40)

## 2016-01-07 LAB — HEPATIC FUNCTION PANEL
ALT: 33 IU/L (ref 0–44)
AST: 23 IU/L (ref 0–40)
Albumin: 4.6 g/dL (ref 3.6–4.8)
Alkaline Phosphatase: 65 IU/L (ref 39–117)
Bilirubin Total: 0.8 mg/dL (ref 0.0–1.2)
Bilirubin, Direct: 0.22 mg/dL (ref 0.00–0.40)
Total Protein: 6.7 g/dL (ref 6.0–8.5)

## 2016-01-07 LAB — HEMOGLOBIN A1C
Est. average glucose Bld gHb Est-mCnc: 131 mg/dL
Hgb A1c MFr Bld: 6.2 % — ABNORMAL HIGH (ref 4.8–5.6)

## 2016-01-07 LAB — BASIC METABOLIC PANEL
BUN/Creatinine Ratio: 12 (ref 10–24)
BUN: 11 mg/dL (ref 8–27)
CO2: 27 mmol/L (ref 18–29)
Calcium: 9.6 mg/dL (ref 8.6–10.2)
Chloride: 101 mmol/L (ref 96–106)
Creatinine, Ser: 0.93 mg/dL (ref 0.76–1.27)
GFR calc Af Amer: 102 mL/min/{1.73_m2} (ref >59)
GFR calc non Af Amer: 88 mL/min/{1.73_m2} (ref >59)
Glucose: 118 mg/dL — ABNORMAL HIGH (ref 65–99)
Potassium: 4.5 mmol/L (ref 3.5–5.2)
Sodium: 142 mmol/L (ref 134–144)

## 2016-01-07 LAB — PSA: Prostate Specific Ag, Serum: 1.8 ng/mL (ref 0.0–4.0)

## 2016-02-07 ENCOUNTER — Other Ambulatory Visit: Payer: Self-pay | Admitting: Nurse Practitioner

## 2016-02-07 DIAGNOSIS — Z23 Encounter for immunization: Secondary | ICD-10-CM | POA: Diagnosis not present

## 2016-02-10 ENCOUNTER — Encounter: Payer: Self-pay | Admitting: Nurse Practitioner

## 2016-02-23 NOTE — Progress Notes (Signed)
Electrophysiology Office Note Date: 02/24/2016  ID:  [name], [name] [birth year], MRN [id number]  PCP: Sallee Lange, MD Primary Cardiologist: Bronson Ing Electrophysiologist: Allred  CC: Pacemaker follow-up  [name] is a 60 y.o. male seen today for Dr [name].  He presents today for routine electrophysiology followup.  Since last being seen in our clinic, the patient reports doing relatively well.  He has chronic atypical chest pain that is unchanged from previous. He feels that he is slightly more short of breath than baseline and his weight is up a few pounds.  He denies palpitations,  PND, orthopnea, nausea, vomiting, dizziness, syncope, edema, or early satiety.   Past Medical History:  Diagnosis Date  . ADD (attention deficit disorder)    Rx-Adderall  . Anginal pain ([place])   . AV block, 3rd degree ([facility])   . Colon polyps 2011   tubular adenoma by excisional biopsy during colonoscopy  . Complete heart block ([name])   . Coronary artery disease   . Degenerative disc disease, cervical   . GERD (gastroesophageal reflux disease)   . Hypertension   . Palpitations 2003   Holter in 2003: PACs and PVCs; negative stress nuclear test in 2005; right bundle branch block; echo in 2008-mild LVH, otherwise normal; 2008-negative stress nuclear test.  . Pneumonia   . Prostatitis    prostate calcifications by CT  . Sleep apnea    questionable diagnosis   Past Surgical History:  Procedure Laterality Date  . ANTERIOR CERVICAL DECOMP/DISCECTOMY FUSION    . Biventricular pacemaker upgrade/ system revision  04/02/14   removal of previous atrial and ventricular leads with placement of a new MDT Consulta CRT-P system at [facility] by Dr [name]  . COLONOSCOPY W/ POLYPECTOMY  2011   tubular adenoma  . ESOPHAGOGASTRODUODENOSCOPY     Gastritis  . KNEE SURGERY     rt  . LEFT HEART CATHETERIZATION WITH CORONARY ANGIOGRAM N/A 10/17/2012   Procedure: LEFT HEART  CATHETERIZATION WITH CORONARY ANGIOGRAM;  Surgeon: [name] Hector, MD;  Location: [facility] CATH LAB;  Service: Cardiovascular;  Laterality: N/A;  . LEFT HEART CATHETERIZATION WITH CORONARY ANGIOGRAM N/A 12/17/2012   Procedure: LEFT HEART CATHETERIZATION WITH CORONARY ANGIOGRAM;  Surgeon: [name] M Martinique, MD;  Location[facility] CATH LAB;  Service: Cardiovascular;  Laterality: N/A;  . LEFT HEART CATHETERIZATION WITH CORONARY ANGIOGRAM N/A 06/11/2013   Procedure: LEFT HEART CATHETERIZATION WITH CORONARY ANGIOGRAM;  Surgeon: [name] Hampshire, MD;  Location: [facility] CATH LAB;  Service: Cardiovascular;  Laterality: N/A;  . PACEMAKER INSERTION  12/17/12   MDT Adapta L implanted by Dr [name] for mobitz II second degree AV block  . PERCUTANEOUS CORONARY STENT INTERVENTION (PCI-S)  10/17/2012   Procedure: PERCUTANEOUS CORONARY STENT INTERVENTION (PCI-S);  Surgeon: [name] Hector, MD;  Location: [facility] CATH LAB;  Service: Cardiovascular;;  . PERMANENT PACEMAKER INSERTION N/A 12/17/2012   Procedure: PERMANENT PACEMAKER INSERTION;  Surgeon: [name] Grayer, MD;  Location: [facility] CATH LAB;  Service: Cardiovascular;  Laterality: N/A;    Current Outpatient Prescriptions  Medication Sig Dispense Refill  . aspirin 81 MG chewable tablet Chew 81 mg by mouth daily.    . cyclobenzaprine (FLEXERIL) 5 MG tablet Take 1 tablet (5 mg total) by mouth 3 (three) times daily as needed. 30 tablet 0  . HYDROcodone-acetaminophen (NORCO) 10-325 MG tablet Take 1 tablet by mouth every 8 (eight) hours as needed. 30 tablet 0  . losartan (COZAAR) 100 MG tablet take 1 tablet by mouth once  daily 90 tablet 3  . mupirocin ointment (BACTROBAN) 2 % Apply bid prn 22 g 3  . nadolol (CORGARD) 20 MG tablet take 1 tablet by mouth once daily 90 tablet 3  . nitroGLYCERIN (NITROSTAT) 0.4 MG SL tablet Place 1 tablet (0.4 mg total) under the tongue every 5 (five) minutes as needed for chest pain (MAX 3 TABLETS). 25 tablet 3  . ondansetron (ZOFRAN) 8 MG tablet Take 1 tablet (8 mg total)  by mouth every 8 (eight) hours as needed for nausea. (Patient taking differently: Take 8 mg by mouth daily as needed for nausea. ) 20 tablet 2  . oxyCODONE-acetaminophen (PERCOCET) 10-325 MG tablet Take 1 tablet by mouth every 4 (four) hours as needed for pain. 30 tablet 0  . pantoprazole (PROTONIX) 40 MG tablet take 1 tablet once daily 90 tablet 1  . simvastatin (ZOCOR) 10 MG tablet take 1 tablet by mouth EVERY EVENING AT 6PM 30 tablet 6  . tamsulosin (FLOMAX) 0.4 MG CAPS capsule take 2 capsules by mouth once daily 60 capsule 5  . furosemide (LASIX) 20 MG tablet Take 1 tablet (20 mg total) by mouth daily. 30 tablet 6   No current facility-administered medications for this visit.     Allergies:   Morphine and related and Latex   Social History: Social History   Social History  . Marital status: Married    Spouse name: N/A  . Number of children: 3  . Years of education: N/A   Occupational History  . Not on file.   Social History Main Topics  . Smoking status: Former Smoker    Packs/day: 2.50    Years: 40.00    Types: Cigarettes    Start date: 04/18/1967    Quit date: 12/20/2003  . Smokeless tobacco: Former Systems developer  . Alcohol use 0.6 oz/week    1 Shots of liquor per week     Comment: occasional  . Drug use: No     Comment: 30 + years ago - "crank, marjiuanna, ect."  . Sexual activity: Yes    Birth control/ protection: Surgical   Other Topics Concern  . Not on file   Social History Narrative  . No narrative on file    Family History: Family History  Problem Relation Age of Onset  . Heart disease Mother   . Hypertension Mother     Carotid disease     Review of Systems: All other systems reviewed and are otherwise negative except as noted above.   Physical Exam: VS:  BP (!) 182/90   Pulse 60   Ht 6' (1.829 m)   Wt 224 lb (101.6 kg)   BMI 30.38 kg/m  , BMI Body mass index is 30.38 kg/m.  GEN- The patient is well appearing, alert and oriented x 3 today.     HEENT: normocephalic, atraumatic; sclera clear, conjunctiva pink; hearing intact; oropharynx clear; neck supple  Lungs- Clear to ausculation bilaterally, normal work of breathing.  No wheezes, rales, rhonchi Heart- Regular rate and rhythm (paced) GI- soft, non-tender, non-distended, bowel sounds present  Extremities- no clubbing, cyanosis, or edema  MS- no significant deformity or atrophy Skin- warm and dry, no rash or lesion; PPM pocket well healed Psych- euthymic mood, full affect Neuro- strength and sensation are intact  PPM Interrogation- reviewed in detail today,  See PACEART report  EKG:  EKG is ordered today. The ekg ordered today shows sinus rhythm with V pacing   Recent Labs: 05/23/2015: Hemoglobin 14.6; Platelets  198 01/06/2016: ALT 33; BUN 11; Creatinine, Ser 0.93; Potassium 4.5; Sodium 142   Wt Readings from Last 3 Encounters:  02/24/16 224 lb (101.6 kg)  01/04/16 225 lb 6 oz (102.2 kg)  10/13/15 235 lb 6 oz (106.8 kg)     Other studies Reviewed: Additional studies/ records that were reviewed today include: Dr Jackalyn Lombard office notes  Assessment and Plan:  1.  Mobitz II Normal PPM function See Pace Art report No changes today  2.  HTN Blood pressure remains elevated Will add Lasix at this today   3.  CAD No recent typical ischemic symptoms He has had no change in exercise tolerance Will adjust meds as above and follow for now as his pain is atypical and chronic for >1 year    Current medicines are reviewed at length with the patient today.   The patient does not have concerns regarding his medicines.     Labs/ tests ordered today include:  Orders Placed This Encounter  Procedures  . Basic metabolic panel  . Brain natriuretic peptide  . EKG 12-Lead  . ECHOCARDIOGRAM COMPLETE     Disposition:   Follow up with me or Dr [name] 2 weeks     Signed, Chanetta Marshall, NP 02/24/2016 11:46 AM  [address]  [place] [address] [phone] (office) [phone] (fax)

## 2016-02-24 ENCOUNTER — Ambulatory Visit (INDEPENDENT_AMBULATORY_CARE_PROVIDER_SITE_OTHER): Payer: BLUE CROSS/BLUE SHIELD | Admitting: Nurse Practitioner

## 2016-02-24 VITALS — BP 182/90 | HR 60 | Ht 72.0 in | Wt 224.0 lb

## 2016-02-24 DIAGNOSIS — I25810 Atherosclerosis of coronary artery bypass graft(s) without angina pectoris: Secondary | ICD-10-CM

## 2016-02-24 DIAGNOSIS — I10 Essential (primary) hypertension: Secondary | ICD-10-CM

## 2016-02-24 DIAGNOSIS — I5022 Chronic systolic (congestive) heart failure: Secondary | ICD-10-CM | POA: Diagnosis not present

## 2016-02-24 DIAGNOSIS — I509 Heart failure, unspecified: Secondary | ICD-10-CM | POA: Diagnosis not present

## 2016-02-24 LAB — BRAIN NATRIURETIC PEPTIDE: Brain Natriuretic Peptide: 50.3 pg/mL (ref <100)

## 2016-02-24 LAB — BASIC METABOLIC PANEL
BUN: 12 mg/dL (ref 7–25)
CO2: 25 mmol/L (ref 20–31)
Calcium: 9.5 mg/dL (ref 8.6–10.3)
Chloride: 104 mmol/L (ref 98–110)
Creat: 0.95 mg/dL (ref 0.70–1.25)
Glucose, Bld: 93 mg/dL (ref 65–99)
Potassium: 4.3 mmol/L (ref 3.5–5.3)
Sodium: 140 mmol/L (ref 135–146)

## 2016-02-24 MED ORDER — FUROSEMIDE 20 MG PO TABS: 20.0000 mg | ORAL_TABLET | Freq: Every day | ORAL | 6 refills | Status: DC

## 2016-02-24 NOTE — Patient Instructions (Signed)
Medication Instructions:   START TAKING LASIX 20 MG ONCE A DAY   If you need a refill on your cardiac medications before your next appointment, please call your pharmacy.  Labwork: BMET AND BNP  TODAY    Testing/Procedures: Your physician has requested that you have an echocardiogram. Echocardiography is a painless test that uses sound waves to create images of your heart. It provides your doctor with information about the size and shape of your heart and how well your heart's chambers and valves are working. This procedure takes approximately one hour. There are no restrictions for this procedure.    Follow-Up: PLEASE CONTACT MELISSA TATUM [phone]   Any Other Special Instructions Will Be Listed Below (If Applicable).

## 2016-03-01 ENCOUNTER — Telehealth: Payer: Self-pay | Admitting: *Deleted

## 2016-03-01 NOTE — Telephone Encounter (Signed)
-----   Message from [name] Berthold, NP sent at 02/24/2016  8:03 PM EST ----- Please notify patient of stable labs. Thanks!

## 2016-03-01 NOTE — Telephone Encounter (Signed)
SPOKE TO PT ABOUT RESULTS AND VERBALIZED UNDERSTANDING  

## 2016-03-03 ENCOUNTER — Ambulatory Visit (AMBULATORY_SURGERY_CENTER): Payer: Self-pay | Admitting: *Deleted

## 2016-03-03 VITALS — Ht 72.0 in | Wt 221.0 lb

## 2016-03-03 DIAGNOSIS — Z86010 Personal history of colonic polyps: Secondary | ICD-10-CM

## 2016-03-03 MED ORDER — NA SULFATE-K SULFATE-MG SULF 17.5-3.13-1.6 GM/177ML PO SOLN: 1.0000 | Freq: Once | ORAL | 0 refills | Status: AC

## 2016-03-03 NOTE — Progress Notes (Signed)
No egg or soy allergy known to patient  No issues with past sedation with any surgeries  or procedures, no intubation problems  No diet pills per patient No home 02 use per patient  No blood thinners per patient  Pt denies issues with constipation  No A fib or A flutter  Off plavix for 1 year per pt.

## 2016-03-06 ENCOUNTER — Ambulatory Visit (HOSPITAL_COMMUNITY)
Admission: RE | Admit: 2016-03-06 | Discharge: 2016-03-06 | Disposition: A | Discharge status: Home / Self Care | Payer: BLUE CROSS/BLUE SHIELD | Source: Ambulatory Visit | Attending: Nurse Practitioner | Admitting: Nurse Practitioner

## 2016-03-06 DIAGNOSIS — G4733 Obstructive sleep apnea (adult) (pediatric): Secondary | ICD-10-CM | POA: Diagnosis not present

## 2016-03-06 DIAGNOSIS — Z955 Presence of coronary angioplasty implant and graft: Secondary | ICD-10-CM | POA: Insufficient documentation

## 2016-03-06 DIAGNOSIS — K219 Gastro-esophageal reflux disease without esophagitis: Secondary | ICD-10-CM | POA: Diagnosis not present

## 2016-03-06 DIAGNOSIS — E785 Hyperlipidemia, unspecified: Secondary | ICD-10-CM | POA: Insufficient documentation

## 2016-03-06 DIAGNOSIS — I5022 Chronic systolic (congestive) heart failure: Secondary | ICD-10-CM | POA: Insufficient documentation

## 2016-03-06 DIAGNOSIS — I110 Hypertensive heart disease with heart failure: Secondary | ICD-10-CM | POA: Diagnosis not present

## 2016-03-06 LAB — CUP PACEART INCLINIC DEVICE CHECK
Date Time Interrogation Session: 20171120121526
Implantable Lead Implant Date: 20151217
Implantable Lead Implant Date: 20151217
Implantable Lead Implant Date: 20151217
Implantable Lead Location: 753858
Implantable Lead Location: 753859
Implantable Lead Location: 753860
Implantable Lead Model: 4196
Implantable Lead Model: 5076
Implantable Lead Model: 5076
Implantable Pulse Generator Implant Date: 20151217

## 2016-03-06 NOTE — Progress Notes (Signed)
*  PRELIMINARY RESULTS* Echocardiogram 2D Echocardiogram has been performed.  [name] 03/06/2016, 9:11 AM

## 2016-03-07 ENCOUNTER — Encounter: Payer: Self-pay | Admitting: Gastroenterology

## 2016-03-11 ENCOUNTER — Other Ambulatory Visit: Payer: Self-pay | Admitting: Family Medicine

## 2016-03-13 ENCOUNTER — Telehealth: Payer: Self-pay | Admitting: *Deleted

## 2016-03-13 NOTE — Telephone Encounter (Signed)
PT NOTIFIED OF RESULTS

## 2016-03-13 NOTE — Telephone Encounter (Signed)
-----   Message from Patsey Berthold, NP sent at 03/07/2016  9:26 AM EST ----- Please notify patient of stable echo results.

## 2016-03-17 ENCOUNTER — Encounter: Payer: Self-pay | Admitting: Gastroenterology

## 2016-03-17 ENCOUNTER — Ambulatory Visit (AMBULATORY_SURGERY_CENTER): Payer: BLUE CROSS/BLUE SHIELD | Admitting: Gastroenterology

## 2016-03-17 VITALS — BP 145/85 | HR 64 | Temp 98.4°F | Resp 13 | Ht 72.0 in | Wt 221.0 lb

## 2016-03-17 DIAGNOSIS — D125 Benign neoplasm of sigmoid colon: Secondary | ICD-10-CM | POA: Diagnosis not present

## 2016-03-17 DIAGNOSIS — D128 Benign neoplasm of rectum: Secondary | ICD-10-CM | POA: Diagnosis not present

## 2016-03-17 DIAGNOSIS — Z86010 Personal history of colonic polyps: Secondary | ICD-10-CM | POA: Diagnosis not present

## 2016-03-17 DIAGNOSIS — D120 Benign neoplasm of cecum: Secondary | ICD-10-CM | POA: Diagnosis not present

## 2016-03-17 DIAGNOSIS — D127 Benign neoplasm of rectosigmoid junction: Secondary | ICD-10-CM | POA: Diagnosis not present

## 2016-03-17 MED ORDER — SODIUM CHLORIDE 0.9 % IV SOLN: 500.0000 mL | INTRAVENOUS | Status: DC

## 2016-03-17 NOTE — Progress Notes (Signed)
Report to PACU, RN, vss, BBS= Clear.  

## 2016-03-17 NOTE — Patient Instructions (Signed)
Impressions/recommendations:  Polyps (handout given) Diverticulosis (handout given) High Fiber Diet (handout given) Hemorrhoids (handout given)  Repeat colonoscopy in 5 years.  YOU HAD AN ENDOSCOPIC PROCEDURE TODAY AT [facility]:   Refer to the procedure report that was given to you for any specific questions about what was found during the examination.  If the procedure report does not answer your questions, please call your gastroenterologist to clarify.  If you requested that your care partner not be given the details of your procedure findings, then the procedure report has been included in a sealed envelope for you to review at your convenience later.  YOU SHOULD EXPECT: Some feelings of bloating in the abdomen. Passage of more gas than usual.  Walking can help get rid of the air that was put into your GI tract during the procedure and reduce the bloating. If you had a lower endoscopy (such as a colonoscopy or flexible sigmoidoscopy) you may notice spotting of blood in your stool or on the toilet paper. If you underwent a bowel prep for your procedure, you may not have a normal bowel movement for a few days.  Please Note:  You might notice some irritation and congestion in your nose or some drainage.  This is from the oxygen used during your procedure.  There is no need for concern and it should clear up in a day or so.  SYMPTOMS TO REPORT IMMEDIATELY:   Following lower endoscopy (colonoscopy or flexible sigmoidoscopy):  Excessive amounts of blood in the stool  Significant tenderness or worsening of abdominal pains  Swelling of the abdomen that is new, acute  Fever of 100F or higher  For urgent or emergent issues, a gastroenterologist can be reached at any hour by calling [phone].   DIET:  We do recommend a small meal at first, but then you may proceed to your regular diet.  Drink plenty of fluids but you should avoid alcoholic beverages for 24  hours.  ACTIVITY:  You should plan to take it easy for the rest of today and you should NOT DRIVE or use heavy machinery until tomorrow (because of the sedation medicines used during the test).    FOLLOW UP: Our staff will call the number listed on your records the next business day following your procedure to check on you and address any questions or concerns that you may have regarding the information given to you following your procedure. If we do not reach you, we will leave a message.  However, if you are feeling well and you are not experiencing any problems, there is no need to return our call.  We will assume that you have returned to your regular daily activities without incident.  If any biopsies were taken you will be contacted by phone or by letter within the next 1-3 weeks.  Please call us at [phone] if you have not heard about the biopsies in 3 weeks.    SIGNATURES/CONFIDENTIALITY: You and/or your care partner have signed paperwork which will be entered into your electronic medical record.  These signatures attest to the fact that that the information above on your After Visit Summary has been reviewed and is understood.  Full responsibility of the confidentiality of this discharge information lies with you and/or your care-partner.

## 2016-03-17 NOTE — Op Note (Signed)
[facility] Patient Name: [name] Procedure Date: 03/17/2016 8:57 AM MRN: [id number] Endoscopist: Ladene Artist , MD Age: 60 Referring MD:  Date of Birth: Feb 21, 1956 Gender: Male Account #: [id number] Procedure:                Colonoscopy Indications:              Surveillance: Personal history of adenomatous                            polyps on last colonoscopy > 5 years ago Medicines:                Monitored Anesthesia Care Procedure:                Pre-Anesthesia Assessment:                           - Prior to the procedure, a History and Physical                            was performed, and patient medications and                            allergies were reviewed. The patient's tolerance of                            previous anesthesia was also reviewed. The risks                            and benefits of the procedure and the sedation                            options and risks were discussed with the patient.                            All questions were answered, and informed consent                            was obtained. Prior Anticoagulants: The patient has                            taken no previous anticoagulant or antiplatelet                            agents. ASA Grade Assessment: III - A patient with                            severe systemic disease. After reviewing the risks                            and benefits, the patient was deemed in                            satisfactory condition to undergo the procedure.  After obtaining informed consent, the colonoscope                            was passed under direct vision. Throughout the                            procedure, the patient's blood pressure, pulse, and                            oxygen saturations were monitored continuously. The                            Model PCF-H190DL [id number]) scope was introduced                            through the anus  and advanced to the the cecum,                            identified by appendiceal orifice and ileocecal                            valve. The ileocecal valve, appendiceal orifice,                            and rectum were photographed. The quality of the                            bowel preparation was good. The colonoscopy was                            performed without difficulty. The patient tolerated                            the procedure well. Scope In: 9:04:34 AM Scope Out: 9:21:11 AM Scope Withdrawal Time: 0 hours 14 minutes 54 seconds  Total Procedure Duration: 0 hours 16 minutes 37 seconds  Findings:                 The perianal and digital rectal examinations were                            normal.                           A 7 mm polyp was found in the rectum. The polyp was                            sessile. The polyp was removed with a cold snare.                            Resection and retrieval were complete.                           Three sessile polyps were found in the sigmoid  colon (1) and cecum (2). The polyps were 3 to 5 mm                            in size. These polyps were removed with a cold                            biopsy forceps. Resection and retrieval were                            complete.                           Internal hemorrhoids were found during                            retroflexion. The hemorrhoids were small and Grade                            I (internal hemorrhoids that do not prolapse).                           A few medium-mouthed diverticula were found in the                            sigmoid colon.                           The exam was otherwise without abnormality on                            direct and retroflexion views. Complications:            No immediate complications. Estimated blood loss:                            None. Estimated Blood Loss:     Estimated blood loss:  none. Impression:               - One 7 mm polyp in the rectum, removed with a cold                            snare. Resected and retrieved.                           - Three 3 to 5 mm polyps in the sigmoid colon and                            in the cecum, removed with a cold biopsy forceps.                            Resected and retrieved.                           - Internal hemorrhoids.                           -  Diverticulosis in the sigmoid colon.                           - The examination was otherwise normal on direct                            and retroflexion views. Recommendation:           - Repeat colonoscopy in 5 years for surveillance.                           - Patient has a contact number available for                            emergencies. The signs and symptoms of potential                            delayed complications were discussed with the                            patient. Return to normal activities tomorrow.                            Written discharge instructions were provided to the                            patient.                           - High fiber diet.                           - Continue present medications.                           - Await pathology results. Ladene Artist, MD 03/17/2016 9:27:33 AM This report has been signed electronically.

## 2016-03-20 ENCOUNTER — Telehealth: Payer: Self-pay

## 2016-03-20 NOTE — Telephone Encounter (Signed)
  Follow up Call-  Call back number 03/17/2016  Post procedure Call Back phone  # [phone]  Permission to leave phone message Yes  Some recent data might be hidden    Patient was called for follow up after his procedure on 03/17/2016. I spoke to the patients wife and she reports that [name] has returned to his normal daily activities without any complication.

## 2016-03-27 ENCOUNTER — Encounter: Payer: Self-pay | Admitting: Gastroenterology

## 2016-04-18 ENCOUNTER — Other Ambulatory Visit: Payer: Self-pay | Admitting: Family Medicine

## 2016-04-18 ENCOUNTER — Encounter: Payer: Self-pay | Admitting: Family Medicine

## 2016-04-18 MED ORDER — ONDANSETRON HCL 8 MG PO TABS: 8.0000 mg | ORAL_TABLET | Freq: Three times a day (TID) | ORAL | 2 refills | Status: DC | PRN

## 2016-04-19 NOTE — Progress Notes (Addendum)
Electrophysiology Office Note Date: 04/21/2016  ID:  Israel, Woodridge 1956-03-21, MRN [id number]  PCP: Sallee Lange, MD Primary Cardiologist: Bronson Ing Electrophysiologist: Allred  CC: Pacemaker follow-up  [name] is a 61 y.o. male seen today for Dr Rayann Heman.  He presents today for routine electrophysiology followup.  Since last being seen in our clinic, the patient reports doing relatively well.  He has chronic atypical chest pain that is unchanged from previous.  He did not tolerate Lasix stating it made him feel "weird".  His shortness of breath is stable. He has had increased dietary sodium recently with the holidays.  He denies palpitations,  PND, orthopnea, nausea, vomiting, dizziness, syncope, edema, or early satiety.   Past Medical History:  Diagnosis Date  . ADD (attention deficit disorder)    Rx-Adderall  . Anginal pain ([place])   . AV block, 3rd degree (HCC)   . Colon polyps 2011   tubular adenoma by excisional biopsy during colonoscopy  . Complete heart block ([place])   . Coronary artery disease   . Degenerative disc disease, cervical   . GERD (gastroesophageal reflux disease)   . Hypertension   . Pacemaker   . Palpitations 2003   Holter in 2003: PACs and PVCs; negative stress nuclear test in 2005; right bundle branch block; echo in 2008-mild LVH, otherwise normal; 2008-negative stress nuclear test.  . Pneumonia   . Prostatitis    prostate calcifications by CT  . Sleep apnea    questionable diagnosis   Past Surgical History:  Procedure Laterality Date  . ANTERIOR CERVICAL DECOMP/DISCECTOMY FUSION    . Biventricular pacemaker upgrade/ system revision  04/02/14   removal of previous atrial and ventricular leads with placement of a new MDT Consulta CRT-P system at [facility] by Dr Violet Baldy  . COLONOSCOPY    . COLONOSCOPY W/ POLYPECTOMY  2011   tubular adenoma  . ESOPHAGOGASTRODUODENOSCOPY     Gastritis  . KNEE SURGERY     rt  . LEFT  HEART CATHETERIZATION WITH CORONARY ANGIOGRAM N/A 10/17/2012   Procedure: LEFT HEART CATHETERIZATION WITH CORONARY ANGIOGRAM;  Surgeon: Josue Hector, MD;  Location: [facility] CATH LAB;  Service: Cardiovascular;  Laterality: N/A;  . LEFT HEART CATHETERIZATION WITH CORONARY ANGIOGRAM N/A 12/17/2012   Procedure: LEFT HEART CATHETERIZATION WITH CORONARY ANGIOGRAM;  Surgeon: Peter M Martinique, MD;  Location: [facility] CATH LAB;  Service: Cardiovascular;  Laterality: N/A;  . LEFT HEART CATHETERIZATION WITH CORONARY ANGIOGRAM N/A 06/11/2013   Procedure: LEFT HEART CATHETERIZATION WITH CORONARY ANGIOGRAM;  Surgeon: Wellington Hampshire, MD;  Location: [place] CATH LAB;  Service: Cardiovascular;  Laterality: N/A;  . PACEMAKER INSERTION  12/17/12   MDT Adapta L implanted by Dr Rayann Heman for mobitz II second degree AV block  . PERCUTANEOUS CORONARY STENT INTERVENTION (PCI-S)  10/17/2012   Procedure: PERCUTANEOUS CORONARY STENT INTERVENTION (PCI-S);  Surgeon: Josue Hector, MD;  Location: [facility] CATH LAB;  Service: Cardiovascular;;  . PERMANENT PACEMAKER INSERTION N/A 12/17/2012   Procedure: PERMANENT PACEMAKER INSERTION;  Surgeon: Thompson Grayer, MD;  Location: [facility] CATH LAB;  Service: Cardiovascular;  Laterality: N/A;  . POLYPECTOMY    . UPPER GASTROINTESTINAL ENDOSCOPY      Current Outpatient Prescriptions  Medication Sig Dispense Refill  . aspirin 81 MG chewable tablet Chew 81 mg by mouth daily.    . cyclobenzaprine (FLEXERIL) 5 MG tablet Take 1 tablet (5 mg total) by mouth 3 (three) times daily as needed. 30 tablet 0  . HYDROcodone-acetaminophen ([place])  10-325 MG tablet Take 1 tablet by mouth every 8 (eight) hours as needed. 30 tablet 0  . losartan (COZAAR) 100 MG tablet take 1 tablet by mouth once daily 90 tablet 3  . nadolol (CORGARD) 20 MG tablet take 1 tablet by mouth once daily 90 tablet 3  . nitroGLYCERIN (NITROSTAT) 0.4 MG SL tablet Place 1 tablet (0.4 mg total) under the tongue every 5 (five) minutes as needed for chest pain (MAX 3  TABLETS). 25 tablet 3  . ondansetron (ZOFRAN) 8 MG tablet Take 1 tablet (8 mg total) by mouth every 8 (eight) hours as needed for nausea. 20 tablet 2  . oxyCODONE-acetaminophen (PERCOCET) 10-325 MG tablet Take 1 tablet by mouth every 4 (four) hours as needed for pain. 30 tablet 0  . pantoprazole (PROTONIX) 40 MG tablet take 1 tablet by mouth once daily 90 tablet 0  . simvastatin (ZOCOR) 10 MG tablet take 1 tablet by mouth EVERY EVENING AT 6PM 30 tablet 6  . tamsulosin (FLOMAX) 0.4 MG CAPS capsule take 2 capsules by mouth once daily 60 capsule 5   Current Facility-Administered Medications  Medication Dose Route Frequency Provider Last Rate Last Dose  . 0.9 %  sodium chloride infusion  500 mL Intravenous Continuous Ladene Artist, MD        Allergies:   Morphine and related; Lasix [furosemide]; and Latex   Social History: Social History   Social History  . Marital status: Married    Spouse name: N/A  . Number of children: 3  . Years of education: N/A   Occupational History  . Not on file.   Social History Main Topics  . Smoking status: Former Smoker    Packs/day: 2.50    Years: 40.00    Types: Cigarettes    Start date: 04/18/1967    Quit date: 12/20/2003  . Smokeless tobacco: Former Systems developer  . Alcohol use 0.6 oz/week    1 Shots of liquor per week     Comment: occasional  . Drug use:     Types: Marijuana     Comment: 30 + years ago - "crank, marjiuanna, ect."  . Sexual activity: Yes    Birth control/ protection: Surgical   Other Topics Concern  . Not on file   Social History Narrative  . No narrative on file    Family History: Family History  Problem Relation Age of Onset  . Heart disease Mother   . Hypertension Mother     Carotid disease  . Prostate cancer Brother   . Breast cancer Maternal Aunt   . Colon cancer Neg Hx   . Colon polyps Neg Hx      Review of Systems: All other systems reviewed and are otherwise negative except as noted above.   Physical  Exam: VS:  BP 140/86   Pulse 62   Ht 6' (1.829 m)   Wt 233 lb 3.2 oz (105.8 kg)   BMI 31.63 kg/m  , BMI Body mass index is 31.63 kg/m.  GEN- The patient is well appearing, alert and oriented x 3 today.   HEENT: normocephalic, atraumatic; sclera clear, conjunctiva pink; hearing intact; oropharynx clear; neck supple  Lungs- Clear to ausculation bilaterally, normal work of breathing.  No wheezes, rales, rhonchi Heart- Regular rate and rhythm (paced) GI- soft, non-tender, non-distended, bowel sounds present  Extremities- no clubbing, cyanosis, or edema  MS- no significant deformity or atrophy Skin- warm and dry, no rash or lesion; PPM pocket well healed Psych-  euthymic mood, full affect Neuro- strength and sensation are intact  PPM Interrogation- reviewed in detail today,  See PACEART report  EKG:  EKG is not ordered today.  Recent Labs: 05/23/2015: Hemoglobin 14.6; Platelets 198 01/06/2016: ALT 33 02/24/2016: Brain Natriuretic Peptide 50.3; BUN 12; Creat 0.95; Potassium 4.3; Sodium 140   Wt Readings from Last 3 Encounters:  04/20/16 233 lb 3.2 oz (105.8 kg)  03/17/16 221 lb (100.2 kg)  03/03/16 221 lb (100.2 kg)     Other studies Reviewed: Additional studies/ records that were reviewed today include: Dr Jackalyn Lombard office notes  Assessment and Plan:  1.  Mobitz II Normal PPM function See Pace Art report No changes today  2.  HTN BP elevated today Will increase Nadolol to 40mg  daily  Dietary changes as below  3.  CAD No recent typical ischemic symptoms He has had no change in exercise tolerance Will adjust meds as above and follow for now as his pain is atypical and chronic for >1 year   4.  Shortness of breath Stable He did not tolerate Lasix He is going to work on dietary changes and follow for now He is aware of symptoms to call back for    Current medicines are reviewed at length with the patient today.   The patient does not have concerns regarding his  medicines.     Labs/ tests ordered today include: none No orders of the defined types were placed in this encounter.    Disposition:   Follow up with [facility], Dr Rayann Heman 1 year    Signed, Chanetta Marshall, NP 04/21/2016 8:13 AM  [facility] [address] [place] [address] [phone] (office) [phone] (fax)

## 2016-04-20 ENCOUNTER — Ambulatory Visit (INDEPENDENT_AMBULATORY_CARE_PROVIDER_SITE_OTHER): Payer: BLUE CROSS/BLUE SHIELD | Admitting: Nurse Practitioner

## 2016-04-20 ENCOUNTER — Encounter: Payer: Self-pay | Admitting: Nurse Practitioner

## 2016-04-20 VITALS — BP 140/86 | HR 62 | Ht 72.0 in | Wt 233.2 lb

## 2016-04-20 DIAGNOSIS — R0602 Shortness of breath: Secondary | ICD-10-CM | POA: Diagnosis not present

## 2016-04-20 DIAGNOSIS — I441 Atrioventricular block, second degree: Secondary | ICD-10-CM

## 2016-04-20 DIAGNOSIS — I10 Essential (primary) hypertension: Secondary | ICD-10-CM | POA: Diagnosis not present

## 2016-04-20 NOTE — Patient Instructions (Addendum)
Medication Instructions:  Your physician recommends that you continue on your current medications as directed. Please refer to the Current Medication list given to you today.   Labwork: None ordered   Testing/Procedures: None ordered   Follow-Up: Remote monitoring is used to monitor your Pacemaker  from home. This monitoring reduces the number of office visits required to check your device to one time per year. It allows [place] to keep an eye on the functioning of your device to ensure it is working properly. You are scheduled for a device check from home on 07/20/16. You may send your transmission at any time that day. If you have a wireless device, the transmission will be sent automatically. After your physician reviews your transmission, you will receive a postcard with your next transmission date.   Your physician wants you to follow-up in:12 months with Dr Rayann Heman Dennis Bast will receive a reminder letter in the mail two months in advance. If you don't receive a letter, please call our office to schedule the follow-up appointment.     Any Other Special Instructions Will Be Listed Below (If Applicable).     If you need a refill on your cardiac medications before your next appointment, please call your pharmacy.

## 2016-04-21 LAB — CUP PACEART INCLINIC DEVICE CHECK
Date Time Interrogation Session: 20180105084105
Implantable Lead Implant Date: 20151217
Implantable Lead Implant Date: 20151217
Implantable Lead Implant Date: 20151217
Implantable Lead Location: 753858
Implantable Lead Location: 753859
Implantable Lead Location: 753860
Implantable Lead Model: 4196
Implantable Lead Model: 5076
Implantable Lead Model: 5076
Implantable Pulse Generator Implant Date: 20151217

## 2016-05-20 ENCOUNTER — Other Ambulatory Visit: Payer: Self-pay | Admitting: Nurse Practitioner

## 2016-05-20 MED ORDER — OSELTAMIVIR PHOSPHATE 75 MG PO CAPS: 75.0000 mg | ORAL_CAPSULE | Freq: Two times a day (BID) | ORAL | 0 refills | Status: DC

## 2016-06-01 ENCOUNTER — Encounter: Payer: Self-pay | Admitting: Cardiovascular Disease

## 2016-06-01 ENCOUNTER — Ambulatory Visit (INDEPENDENT_AMBULATORY_CARE_PROVIDER_SITE_OTHER): Payer: BLUE CROSS/BLUE SHIELD | Admitting: Cardiovascular Disease

## 2016-06-01 ENCOUNTER — Telehealth: Payer: Self-pay | Admitting: *Deleted

## 2016-06-01 VITALS — BP 189/93 | HR 63 | Ht 72.0 in | Wt 232.0 lb

## 2016-06-01 DIAGNOSIS — R0789 Other chest pain: Secondary | ICD-10-CM

## 2016-06-01 DIAGNOSIS — I25118 Atherosclerotic heart disease of native coronary artery with other forms of angina pectoris: Secondary | ICD-10-CM | POA: Diagnosis not present

## 2016-06-01 DIAGNOSIS — Z950 Presence of cardiac pacemaker: Secondary | ICD-10-CM | POA: Diagnosis not present

## 2016-06-01 DIAGNOSIS — I10 Essential (primary) hypertension: Secondary | ICD-10-CM

## 2016-06-01 NOTE — Telephone Encounter (Signed)
Message left on nurse voice mail - returning call about Nadolol.  Attempted to return call - left message at 4:34.  Per Dr. Bronson Ing - if patient on Nadolol 20mg  daily, increase to 40mg  daily.  If already on 40mg , then add Hydralazine 25mg  three times per day.

## 2016-06-01 NOTE — Progress Notes (Signed)
SUBJECTIVE: The patient presents for routine follow-up of coronary artery disease. He has a pacemaker and follows with Dr. Rayann Heman.  Systolic blood pressures have been running as high as 190 and up to 200. He was prescribed nadolol 40 mg on [id number] but he is uncertain if he is taking the correct dosage. He continues to eat a lot of salt and a poor diet altogether. He has several questions about eating a more healthy diet and exercise. He has been having headaches, chest tightness when climbing stairs, and decreased energy overall.   Review of Systems: As per "subjective", otherwise negative.  Allergies  Allergen Reactions  . Morphine And Related Itching and Rash    redness  . Lasix [Furosemide]     Chest pain and tightness  . Latex Rash    Current Outpatient Prescriptions  Medication Sig Dispense Refill  . aspirin 81 MG chewable tablet Chew 81 mg by mouth daily.    Marland Kitchen losartan (COZAAR) 100 MG tablet take 1 tablet by mouth once daily 90 tablet 3  . nadolol (CORGARD) 20 MG tablet take 1 tablet by mouth once daily 90 tablet 3  . nitroGLYCERIN (NITROSTAT) 0.4 MG SL tablet Place 1 tablet (0.4 mg total) under the tongue every 5 (five) minutes as needed for chest pain (MAX 3 TABLETS). 25 tablet 3  . ondansetron (ZOFRAN) 8 MG tablet Take 1 tablet (8 mg total) by mouth every 8 (eight) hours as needed for nausea. 20 tablet 2  . pantoprazole (PROTONIX) 40 MG tablet take 1 tablet by mouth once daily 90 tablet 0  . simvastatin (ZOCOR) 10 MG tablet take 1 tablet by mouth EVERY EVENING AT 6PM 30 tablet 6  . tamsulosin (FLOMAX) 0.4 MG CAPS capsule take 2 capsules by mouth once daily 60 capsule 5   Current Facility-Administered Medications  Medication Dose Route Frequency Provider Last Rate Last Dose  . 0.9 %  sodium chloride infusion  500 mL Intravenous Continuous Ladene Artist, MD        Past Medical History:  Diagnosis Date  . ADD (attention deficit disorder)    Rx-Adderall  . Anginal  pain ([place])   . AV block, 3rd degree (HCC)   . Colon polyps 2011   tubular adenoma by excisional biopsy during colonoscopy  . Complete heart block ([place])   . Coronary artery disease   . Degenerative disc disease, cervical   . GERD (gastroesophageal reflux disease)   . Hypertension   . Pacemaker   . Palpitations 2003   Holter in 2003: PACs and PVCs; negative stress nuclear test in 2005; right bundle branch block; echo in 2008-mild LVH, otherwise normal; 2008-negative stress nuclear test.  . Pneumonia   . Prostatitis    prostate calcifications by CT  . Sleep apnea    questionable diagnosis    Past Surgical History:  Procedure Laterality Date  . ANTERIOR CERVICAL DECOMP/DISCECTOMY FUSION    . Biventricular pacemaker upgrade/ system revision  04/02/14   removal of previous atrial and ventricular leads with placement of a new MDT Consulta CRT-P system at [facility] by Dr Violet Baldy  . COLONOSCOPY    . COLONOSCOPY W/ POLYPECTOMY  2011   tubular adenoma  . ESOPHAGOGASTRODUODENOSCOPY     Gastritis  . KNEE SURGERY     rt  . LEFT HEART CATHETERIZATION WITH CORONARY ANGIOGRAM N/A 10/17/2012   Procedure: LEFT HEART CATHETERIZATION WITH CORONARY ANGIOGRAM;  Surgeon: Josue Hector, MD;  Location: [facility] CATH  LAB;  Service: Cardiovascular;  Laterality: N/A;  . LEFT HEART CATHETERIZATION WITH CORONARY ANGIOGRAM N/A 12/17/2012   Procedure: LEFT HEART CATHETERIZATION WITH CORONARY ANGIOGRAM;  Surgeon: Peter M Martinique, MD;  Location: [facility] CATH LAB;  Service: Cardiovascular;  Laterality: N/A;  . LEFT HEART CATHETERIZATION WITH CORONARY ANGIOGRAM N/A 06/11/2013   Procedure: LEFT HEART CATHETERIZATION WITH CORONARY ANGIOGRAM;  Surgeon: Wellington Hampshire, MD;  Location: [place] CATH LAB;  Service: Cardiovascular;  Laterality: N/A;  . PACEMAKER INSERTION  12/17/12   MDT Adapta L implanted by Dr Rayann Heman for mobitz II second degree AV block  . PERCUTANEOUS CORONARY STENT INTERVENTION (PCI-S)  10/17/2012    Procedure: PERCUTANEOUS CORONARY STENT INTERVENTION (PCI-S);  Surgeon: Josue Hector, MD;  Location: [facility] CATH LAB;  Service: Cardiovascular;;  . PERMANENT PACEMAKER INSERTION N/A 12/17/2012   Procedure: PERMANENT PACEMAKER INSERTION;  Surgeon: Thompson Grayer, MD;  Location: [facility] CATH LAB;  Service: Cardiovascular;  Laterality: N/A;  . POLYPECTOMY    . UPPER GASTROINTESTINAL ENDOSCOPY      Social History   Social History  . Marital status: Married    Spouse name: N/A  . Number of children: 3  . Years of education: N/A   Occupational History  . Not on file.   Social History Main Topics  . Smoking status: Former Smoker    Packs/day: 2.50    Years: 40.00    Types: Cigarettes    Start date: 04/18/1967    Quit date: 12/20/2003  . Smokeless tobacco: Former Systems developer  . Alcohol use 0.6 oz/week    1 Shots of liquor per week     Comment: occasional  . Drug use: Yes    Types: Marijuana     Comment: 30 + years ago - "crank, marjiuanna, ect."  . Sexual activity: Yes    Birth control/ protection: Surgical   Other Topics Concern  . Not on file   Social History Narrative  . No narrative on file     Vitals:   06/01/16 1410  BP: (!) 189/93  Pulse: 63  SpO2: 98%  Weight: 232 lb (105.2 kg)  Height: 6' (1.829 m)    PHYSICAL EXAM General: NAD HEENT: Normal. Neck: No JVD, no thyromegaly. Lungs: Clear to auscultation bilaterally with normal respiratory effort. CV: Nondisplaced PMI.  Regular rate and rhythm, normal S1/S2, no [id number], soft 1/6 systolic murmur over RUSB. No pretibial or periankle edema.   Abdomen: Soft, nontender, no distention.  Neurologic: Alert and oriented.  Psych: Normal affect. Skin: Normal. Musculoskeletal: No gross deformities.    ECG: Most recent ECG reviewed.      ASSESSMENT AND PLAN: 1. CAD s/p PCI of RCA: Symptomatically stable. Continue ASA, nadolol 40 mg, and simvastatin.   2. Malignant HTN with chest tightness and headaches: Markedly elevated on  losartan 100 mg and nadolol (uncertain if he is taking 40 mg). If he is taking 40 mg, I will add hydralazine 25 mg 3 times a day.  3. Mobitz II AV block (symptomatic) s/p CRT-P revision at [facility]: Normal pacemaker function. Follows with EP.  Dispo: f/u 2 months  Kate Sable, M.D., F.A.C.C.

## 2016-06-01 NOTE — Patient Instructions (Signed)
Medication Instructions:   Please call the office back in regards to Nadolol dose.   Continue all other medications.    Labwork: none  Testing/Procedures: none  Follow-Up: 2 months   Any Other Special Instructions Will Be Listed Below (If Applicable). Low sodium diet - info sheet given today.   If you need a refill on your cardiac medications before your next appointment, please call your pharmacy.

## 2016-06-02 MED ORDER — NADOLOL 40 MG PO TABS: 40.0000 mg | ORAL_TABLET | Freq: Every day | ORAL | 3 refills | Status: DC

## 2016-06-02 NOTE — Telephone Encounter (Signed)
Pt says LM on nurse VM yesterday wanted to f/u - confirmed pt was taking Nadolol 20 mg and was about to run out needed new rx sent to pharmacy - per Dr Bronson Ing instructions if pt was on 20 mg increase to 40 mg - new rx sent to [facility] [place] and updated medication list.

## 2016-06-05 ENCOUNTER — Telehealth: Payer: Self-pay | Admitting: *Deleted

## 2016-06-05 MED ORDER — HYDRALAZINE HCL 50 MG PO TABS: 50.0000 mg | ORAL_TABLET | Freq: Three times a day (TID) | ORAL | 3 refills | Status: DC

## 2016-06-05 NOTE — Telephone Encounter (Signed)
Pt says BP has been running 170s-200s/80s-90s since Friday - Nadolol was increased 40 mg daily - pt says he has been taking that in the mornings since Thursday - pt denies any changes in symptoms since 2/15 OV - will route to Dr. Bronson Ing

## 2016-06-05 NOTE — Telephone Encounter (Signed)
Increase hydralazine to 50 mg t.i.d.

## 2016-06-05 NOTE — Telephone Encounter (Signed)
Pt voiced understanding - hydralazine sent to pharmacy and pt will f/u later this week with update on BP

## 2016-06-21 ENCOUNTER — Encounter: Payer: Self-pay | Admitting: *Deleted

## 2016-06-21 ENCOUNTER — Ambulatory Visit (INDEPENDENT_AMBULATORY_CARE_PROVIDER_SITE_OTHER): Payer: BLUE CROSS/BLUE SHIELD | Admitting: Physician Assistant

## 2016-06-21 ENCOUNTER — Encounter: Payer: Self-pay | Admitting: Physician Assistant

## 2016-06-21 VITALS — BP 152/86 | HR 63 | Ht 72.0 in | Wt 223.0 lb

## 2016-06-21 DIAGNOSIS — I441 Atrioventricular block, second degree: Secondary | ICD-10-CM

## 2016-06-21 DIAGNOSIS — I25119 Atherosclerotic heart disease of native coronary artery with unspecified angina pectoris: Secondary | ICD-10-CM | POA: Diagnosis not present

## 2016-06-21 DIAGNOSIS — I10 Essential (primary) hypertension: Secondary | ICD-10-CM | POA: Diagnosis not present

## 2016-06-21 DIAGNOSIS — R072 Precordial pain: Secondary | ICD-10-CM

## 2016-06-21 MED ORDER — NADOLOL 80 MG PO TABS: 80.0000 mg | ORAL_TABLET | Freq: Every day | ORAL | 3 refills | Status: DC

## 2016-06-21 NOTE — Progress Notes (Signed)
Cardiology Office Note    Date:  06/21/2016   ID:  [name], DOB February 10, 1956, MRN [id number]  PCP:  Sallee Lange, MD  Cardiologist: Dr. Bronson Ing EPS: Dr. Rayann Heman  Chief Complaint  Patient presents with  . Follow-up    History of Present Illness:  [name] is a 61 y.o. male  with long history of difficult to control hypertension. He also has chronic chest pain ever since his pacemaker was placed and has second pacemaker placed at [facility] that has not helped. He has a history of CAD status post PCI of the RCA in July 2014, patent and follow-up cath in 2015. 2-D echo 02/2016 EF EF 55-60% with grade 1 DD trivial AI and MR. Pacemaker is functioning normally at checkup 04/20/16. Blood pressure was high that day and nadolol was increased to 40 mg daily.  Saw Dr. Bronson Ing 6/71/24 and systolic blood pressures were 190-200. He was still eating a lot of salt and poor diet altogether. He was having headaches, chest tightness when climbing stairs and decreased energy. He was also on losartan 100 mg daily. Hydralazine 25 mg 3 times a day was added but then increased to 50 mg TID.  Patient calls in today complaining of blood pressure still being high 580-998'P systolic but sometimes down to 382 systolic. Patient also complains of chest tightness that is almost there all the time and very rarely goes away. He says he gets a little worse when he starts walking it eases as he continues walking. He says he hasn't had an EKG in a while where a stress test. His last nuclear stress test was in 06/2015 was low risk with small mild intensity fixed inferior apical defect most consistent with attenuation artifact. No significant ischemia. Hypertensive response to exercise. LVEF 58%.      Past Medical History:  Diagnosis Date  . ADD (attention deficit disorder)    Rx-Adderall  . Anginal pain ([place])   . AV block, 3rd degree (HCC)   . Colon polyps 2011   tubular adenoma by excisional  biopsy during colonoscopy  . Complete heart block ([place])   . Coronary artery disease   . Degenerative disc disease, cervical   . GERD (gastroesophageal reflux disease)   . Hypertension   . Pacemaker   . Palpitations 2003   Holter in 2003: PACs and PVCs; negative stress nuclear test in 2005; right bundle branch block; echo in 2008-mild LVH, otherwise normal; 2008-negative stress nuclear test.  . Pneumonia   . Prostatitis    prostate calcifications by CT  . Sleep apnea    questionable diagnosis    Past Surgical History:  Procedure Laterality Date  . ANTERIOR CERVICAL DECOMP/DISCECTOMY FUSION    . Biventricular pacemaker upgrade/ system revision  04/02/14   removal of previous atrial and ventricular leads with placement of a new MDT Consulta CRT-P system at [facility] by Dr Violet Baldy  . COLONOSCOPY    . COLONOSCOPY W/ POLYPECTOMY  2011   tubular adenoma  . ESOPHAGOGASTRODUODENOSCOPY     Gastritis  . KNEE SURGERY     rt  . LEFT HEART CATHETERIZATION WITH CORONARY ANGIOGRAM N/A 10/17/2012   Procedure: LEFT HEART CATHETERIZATION WITH CORONARY ANGIOGRAM;  Surgeon: Josue Hector, MD;  Location: [facility] CATH LAB;  Service: Cardiovascular;  Laterality: N/A;  . LEFT HEART CATHETERIZATION WITH CORONARY ANGIOGRAM N/A 12/17/2012   Procedure: LEFT HEART CATHETERIZATION WITH CORONARY ANGIOGRAM;  Surgeon: Peter M Martinique, MD;  Location: [facility] CATH  LAB;  Service: Cardiovascular;  Laterality: N/A;  . LEFT HEART CATHETERIZATION WITH CORONARY ANGIOGRAM N/A 06/11/2013   Procedure: LEFT HEART CATHETERIZATION WITH CORONARY ANGIOGRAM;  Surgeon: Wellington Hampshire, MD;  Location: [facility] CATH LAB;  Service: Cardiovascular;  Laterality: N/A;  . PACEMAKER INSERTION  12/17/12   MDT Adapta L implanted by Dr Rayann Heman for mobitz II second degree AV block  . PERCUTANEOUS CORONARY STENT INTERVENTION (PCI-S)  10/17/2012   Procedure: PERCUTANEOUS CORONARY STENT INTERVENTION (PCI-S);  Surgeon: Josue Hector, MD;  Location: [facility]  CATH LAB;  Service: Cardiovascular;;  . PERMANENT PACEMAKER INSERTION N/A 12/17/2012   Procedure: PERMANENT PACEMAKER INSERTION;  Surgeon: Thompson Grayer, MD;  Location: [facility] CATH LAB;  Service: Cardiovascular;  Laterality: N/A;  . POLYPECTOMY    . UPPER GASTROINTESTINAL ENDOSCOPY      Current Medications: Outpatient Medications Prior to Visit  Medication Sig Dispense Refill  . aspirin 81 MG chewable tablet Chew 81 mg by mouth daily.    . hydrALAZINE (APRESOLINE) 50 MG tablet Take 1 tablet (50 mg total) by mouth 3 (three) times daily. 90 tablet 3  . losartan (COZAAR) 100 MG tablet take 1 tablet by mouth once daily 90 tablet 3  . nitroGLYCERIN (NITROSTAT) 0.4 MG SL tablet Place 1 tablet (0.4 mg total) under the tongue every 5 (five) minutes as needed for chest pain (MAX 3 TABLETS). 25 tablet 3  . ondansetron (ZOFRAN) 8 MG tablet Take 1 tablet (8 mg total) by mouth every 8 (eight) hours as needed for nausea. 20 tablet 2  . pantoprazole (PROTONIX) 40 MG tablet take 1 tablet by mouth once daily 90 tablet 0  . simvastatin (ZOCOR) 10 MG tablet take 1 tablet by mouth EVERY EVENING AT 6PM 30 tablet 6  . tamsulosin (FLOMAX) 0.4 MG CAPS capsule take 2 capsules by mouth once daily 60 capsule 5  . nadolol (CORGARD) 40 MG tablet Take 1 tablet (40 mg total) by mouth daily. 30 tablet 3   Facility-Administered Medications Prior to Visit  Medication Dose Route Frequency Provider Last Rate Last Dose  . 0.9 %  sodium chloride infusion  500 mL Intravenous Continuous Ladene Artist, MD         Allergies:   Morphine and related; Lasix [furosemide]; and Latex   Social History   Social History  . Marital status: Married    Spouse name: N/A  . Number of children: 3  . Years of education: N/A   Social History Main Topics  . Smoking status: Former Smoker    Packs/day: 2.50    Years: 40.00    Types: Cigarettes    Start date: 04/18/1967    Quit date: 12/20/2003  . Smokeless tobacco: Former Systems developer  . Alcohol use  0.6 oz/week    1 Shots of liquor per week     Comment: occasional  . Drug use: Yes    Types: Marijuana     Comment: 30 + years ago - "crank, marjiuanna, ect."  . Sexual activity: Yes    Birth control/ protection: Surgical   Other Topics Concern  . None   Social History Narrative  . None     Family History:  The patient's   family history includes Breast cancer in his maternal aunt; Heart disease in his mother; Hypertension in his mother; Prostate cancer in his brother.   ROS:   Please see the history of present illness.    Review of Systems  Constitution: Positive for malaise/fatigue.  HENT: Negative.  Cardiovascular: Positive for chest pain.  Respiratory: Negative.   Endocrine: Negative.   Hematologic/Lymphatic: Negative.   Musculoskeletal: Negative.   Gastrointestinal: Negative.   Genitourinary: Negative.    All other systems reviewed and are negative.   PHYSICAL EXAM:   VS:  BP (!) 152/86   Pulse 63   Ht 6' (1.829 m)   Wt 223 lb (101.2 kg)   SpO2 95%   BMI 30.24 kg/m   Physical Exam  GEN: Well nourished, well developed, in no acute distress  Neck: no JVD, carotid bruits, or masses Cardiac:RRR; 2/6 systolic murmur LSB,no rubs, or gallops  Respiratory:  clear to auscultation bilaterally, normal work of breathing GI: soft, nontender, nondistended, + BS Ext: without cyanosis, clubbing, or edema, Good distal pulses bilaterally MS: no deformity or atrophy  Psych: euthymic mood, full affect  Wt Readings from Last 3 Encounters:  06/21/16 223 lb (101.2 kg)  06/01/16 232 lb (105.2 kg)  04/20/16 233 lb 3.2 oz (105.8 kg)      Studies/Labs Reviewed:   EKG:  EKG is  ordered today.  The ekg ordered today demonstrates Pacing without acute change  Recent Labs: 01/06/2016: ALT 33 02/24/2016: Brain Natriuretic Peptide 50.3; BUN 12; Creat 0.95; Potassium 4.3; Sodium 140   Lipid Panel    Component Value Date/Time   CHOL 128 01/06/2016 0813   TRIG 103 01/06/2016 0813    HDL 44 01/06/2016 0813   CHOLHDL 2.9 01/06/2016 0813   CHOLHDL 2.5 12/17/2012 0215   VLDL 9 12/17/2012 0215   LDLCALC 63 01/06/2016 0813    Additional studies/ records that were reviewed today include:   2Decho 03/06/16 Study Conclusions   - Left ventricle: The cavity size was normal. Wall thickness was   increased in a pattern of mild LVH. Systolic function was normal.   The estimated ejection fraction was in the range of 55% to 60%.   Wall motion was normal; there were no regional wall motion   abnormalities. Doppler parameters are consistent with abnormal   left ventricular relaxation (grade 1 diastolic dysfunction). - Aortic valve: Mildly to moderately calcified annulus. Possibly   bicuspid; moderately calcified leaflets. There was trivial   regurgitation. - Mitral valve: Calcified annulus. - Left atrium: The atrium was at the upper limits of normal in   size. - Right atrium: Central venous pressure (est): 8 mm Hg. - Tricuspid valve: There was trivial regurgitation. - Pulmonary arteries: Systolic pressure could not be accurately   estimated. - Pericardium, extracardiac: There was no pericardial effusion.   Impressions:   - Mild LVH with LVEF 55-60% and grade 1 diastolic dysfunction.   Upper normal left atrial chamber size. Mildly calcified mitral   annulus with trivial mitral regurgitation. Moderately sclerotic   aortic valve, possibly bicuspid. Trivial aortic regurgitation.   Device wire present within the right heart. Trivial tricuspid   regurgitation.  Nuclear stress 06/30/15:  ECG is uninterpretable due to ventricular pacing throughout.  Small, mild intensity, fixed inferior apical defect most consistent with attenuation artifact. No significant ischemia.  Blood pressure demonstrated a hypertensive response to exercise.  This is a low risk study.  Nuclear stress EF: 58%.       ASSESSMENT:    1. Precordial pain   2. Atherosclerosis of native coronary  artery of native heart with angina pectoris ([place])   3. Essential hypertension   4. Mobitz type II atrioventricular block      PLAN:  In order of problems listed above:  Chest  pain has had for many years but patient is concerned this could be coming from a recurrent blockage. Last nuclear stress test 06/2015 without ischemia. Will order another nuclear stress test to rule out ischemia.  CAD status post PCI of the RCA in 2014, patent and follow-up cath in 2015  Essential hypertension difficult to control over the years. Blood pressure is better today. Patient has reduced his salt intake. Will increase nadolol to 80 mg once daily. Follow-up with Dr.Koneswaran in 1 month  Mobitz type II AV block S/P pacemaker     Medication Adjustments/Labs and Tests Ordered: Current medicines are reviewed at length with the patient today.  Concerns regarding medicines are outlined above.  Medication changes, Labs and Tests ordered today are listed in the Patient Instructions below. Patient Instructions  Your physician recommends that you schedule a follow-up appointment in: 1 Month with Dr. Bronson Ing.  Your physician has recommended you make the following change in your medication: Increase Nadolol to 80 mg Daily   Your physician has requested that you have a lexiscan myoview. For further information please visit [email or web address]. Please follow instruction sheet, as given.  If you need a refill on your cardiac medications before your next appointment, please call your pharmacy.  Thank you for choosing [facility]!        Signed, Ermalinda Barrios, PA-C  06/21/2016 1:35 PM    [facility] [address], [place], [place]  [address] Phone: [phone]; Fax: [phone]

## 2016-06-21 NOTE — Patient Instructions (Signed)
Your physician recommends that you schedule a follow-up appointment in: 1 Month with Dr. Bronson Ing.  Your physician has recommended you make the following change in your medication: Increase Nadolol to 80 mg Daily   Your physician has requested that you have a lexiscan myoview. For further information please visit [email or web address]. Please follow instruction sheet, as given.  If you need a refill on your cardiac medications before your next appointment, please call your pharmacy.  Thank you for choosing [facility]!

## 2016-06-23 ENCOUNTER — Inpatient Hospital Stay (HOSPITAL_COMMUNITY): Admission: RE | Admit: 2016-06-23 | Payer: BLUE CROSS/BLUE SHIELD | Source: Ambulatory Visit

## 2016-06-23 ENCOUNTER — Encounter (HOSPITAL_COMMUNITY): Payer: Self-pay

## 2016-06-23 ENCOUNTER — Encounter (HOSPITAL_COMMUNITY)
Admission: RE | Admit: 2016-06-23 | Discharge: 2016-06-23 | Disposition: A | Discharge status: Home / Self Care | Payer: BLUE CROSS/BLUE SHIELD | Source: Ambulatory Visit | Attending: Physician Assistant | Admitting: Physician Assistant

## 2016-06-23 DIAGNOSIS — R072 Precordial pain: Secondary | ICD-10-CM | POA: Diagnosis not present

## 2016-06-23 DIAGNOSIS — R9439 Abnormal result of other cardiovascular function study: Secondary | ICD-10-CM | POA: Insufficient documentation

## 2016-06-23 LAB — NM MYOCAR MULTI W/SPECT W/WALL MOTION / EF
LV dias vol: 123 mL (ref 62–150)
LV sys vol: 49 mL
Peak HR: 91 {beats}/min
RATE: 0.32
Rest HR: 60 {beats}/min
SDS: 0
SRS: 2
SSS: 2
TID: 1.14

## 2016-06-23 MED ORDER — SODIUM CHLORIDE 0.9% FLUSH
INTRAVENOUS | Status: AC
  Administered 2016-06-23: 10 mL via INTRAVENOUS
  Filled 2016-06-23: qty 10

## 2016-06-23 MED ORDER — TECHNETIUM TC 99M TETROFOSMIN IV KIT
10.0000 | PACK | Freq: Once | INTRAVENOUS | Status: AC | PRN
  Administered 2016-06-23: 10.9 via INTRAVENOUS

## 2016-06-23 MED ORDER — REGADENOSON 0.4 MG/5ML IV SOLN
INTRAVENOUS | Status: AC
  Administered 2016-06-23: 0.4 mg via INTRAVENOUS
  Filled 2016-06-23: qty 5

## 2016-06-23 MED ORDER — TECHNETIUM TC 99M TETROFOSMIN IV KIT
30.0000 | PACK | Freq: Once | INTRAVENOUS | Status: AC | PRN
  Administered 2016-06-23: 31 via INTRAVENOUS

## 2016-07-17 ENCOUNTER — Other Ambulatory Visit: Payer: Self-pay | Admitting: Adult Health

## 2016-08-07 ENCOUNTER — Other Ambulatory Visit: Payer: Self-pay

## 2016-08-07 ENCOUNTER — Ambulatory Visit (INDEPENDENT_AMBULATORY_CARE_PROVIDER_SITE_OTHER): Payer: BLUE CROSS/BLUE SHIELD | Admitting: Cardiovascular Disease

## 2016-08-07 ENCOUNTER — Encounter: Payer: Self-pay | Admitting: Cardiovascular Disease

## 2016-08-07 VITALS — BP 150/78 | HR 70 | Ht 72.0 in | Wt 220.0 lb

## 2016-08-07 DIAGNOSIS — Z713 Dietary counseling and surveillance: Secondary | ICD-10-CM | POA: Diagnosis not present

## 2016-08-07 DIAGNOSIS — I10 Essential (primary) hypertension: Secondary | ICD-10-CM

## 2016-08-07 DIAGNOSIS — I25708 Atherosclerosis of coronary artery bypass graft(s), unspecified, with other forms of angina pectoris: Secondary | ICD-10-CM | POA: Diagnosis not present

## 2016-08-07 DIAGNOSIS — Z950 Presence of cardiac pacemaker: Secondary | ICD-10-CM

## 2016-08-07 DIAGNOSIS — Z7182 Exercise counseling: Secondary | ICD-10-CM

## 2016-08-07 MED ORDER — HYDRALAZINE HCL 50 MG PO TABS: 75.0000 mg | ORAL_TABLET | Freq: Three times a day (TID) | ORAL | 3 refills | Status: DC

## 2016-08-07 NOTE — Patient Instructions (Signed)
Your physician wants you to follow-up in: 6 months You will receive a reminder letter in the mail two months in advance. If you don't receive a letter, please call our office to schedule the follow-up appointment.  INCREASE Hydralazine to 75 mg three times a day   Take blood pressure once a day , 3-4 times a week, return log in 4-6 weeks   If you need a refill on your cardiac medications before your next appointment, please call your pharmacy.     Thank you for choosing [facility] !

## 2016-08-07 NOTE — Progress Notes (Signed)
SUBJECTIVE: The patient presents for follow-up of coronary artery disease with a history of right coronary artery stent placement, along with malignant hypertension. I started hydralazine at his visit on 06/01/16. He also has a pacemaker and follows with EP.  Gerrianne Scale PA-C saw him on 06/21/16 and ordered a stress test and increase nadolol to 80 mg.  He underwent a low risk nuclear stress test on 06/23/16. There was a very small ischemic territory noted in the apical inferolateral wall. Diaphragmatic attenuation was also noted.  He denies chest pain. He does not check his blood pressure frequently at home. He does say that systolic readings can be as low as 126 and as high as 166.  He weiged 223 pounds on March 7 and weighs 220 pounds today. He is trying to eat better, walk more, and lose weight. He eats oatmeal with almond milk or cereal for breakfast. He eats up to 6 apples daily. He tries to walk 4 miles a day at 15 minute pace.   Review of Systems: As per "subjective", otherwise negative.  Allergies  Allergen Reactions  . Morphine And Related Itching and Rash    redness  . Lasix [Furosemide]     Chest pain and tightness  . Latex Rash    Current Outpatient Prescriptions  Medication Sig Dispense Refill  . aspirin 81 MG chewable tablet Chew 81 mg by mouth daily.    . hydrALAZINE (APRESOLINE) 50 MG tablet Take 1 tablet (50 mg total) by mouth 3 (three) times daily. 90 tablet 3  . losartan (COZAAR) 100 MG tablet take 1 tablet by mouth once daily 90 tablet 3  . nadolol (CORGARD) 80 MG tablet Take 1 tablet (80 mg total) by mouth daily. 90 tablet 3  . nitroGLYCERIN (NITROSTAT) 0.4 MG SL tablet Place 1 tablet (0.4 mg total) under the tongue every 5 (five) minutes as needed for chest pain (MAX 3 TABLETS). 25 tablet 3  . ondansetron (ZOFRAN) 8 MG tablet Take 1 tablet (8 mg total) by mouth every 8 (eight) hours as needed for nausea. 20 tablet 2  . pantoprazole (PROTONIX) 40 MG tablet take 1  tablet by mouth once daily 90 tablet 0  . simvastatin (ZOCOR) 10 MG tablet take 1 tablet by mouth every evening 30 tablet 6  . tamsulosin (FLOMAX) 0.4 MG CAPS capsule take 2 capsules by mouth once daily 60 capsule 5   Current Facility-Administered Medications  Medication Dose Route Frequency Provider Last Rate Last Dose  . 0.9 %  sodium chloride infusion  500 mL Intravenous Continuous Ladene Artist, MD        Past Medical History:  Diagnosis Date  . ADD (attention deficit disorder)    Rx-Adderall  . Anginal pain ([place])   . AV block, 3rd degree (HCC)   . Colon polyps 2011   tubular adenoma by excisional biopsy during colonoscopy  . Complete heart block ([place])   . Coronary artery disease   . Degenerative disc disease, cervical   . GERD (gastroesophageal reflux disease)   . Hypertension   . Pacemaker   . Palpitations 2003   Holter in 2003: PACs and PVCs; negative stress nuclear test in 2005; right bundle branch block; echo in 2008-mild LVH, otherwise normal; 2008-negative stress nuclear test.  . Pneumonia   . Prostatitis    prostate calcifications by CT  . Sleep apnea    questionable diagnosis    Past Surgical History:  Procedure Laterality Date  .  ANTERIOR CERVICAL DECOMP/DISCECTOMY FUSION    . Biventricular pacemaker upgrade/ system revision  04/02/14   removal of previous atrial and ventricular leads with placement of a new MDT Consulta CRT-P system at [facility] by Dr Violet Baldy  . COLONOSCOPY    . COLONOSCOPY W/ POLYPECTOMY  2011   tubular adenoma  . ESOPHAGOGASTRODUODENOSCOPY     Gastritis  . KNEE SURGERY     rt  . LEFT HEART CATHETERIZATION WITH CORONARY ANGIOGRAM N/A 10/17/2012   Procedure: LEFT HEART CATHETERIZATION WITH CORONARY ANGIOGRAM;  Surgeon: Josue Hector, MD;  Location: [facility] CATH LAB;  Service: Cardiovascular;  Laterality: N/A;  . LEFT HEART CATHETERIZATION WITH CORONARY ANGIOGRAM N/A 12/17/2012   Procedure: LEFT HEART CATHETERIZATION WITH  CORONARY ANGIOGRAM;  Surgeon: Peter M Martinique, MD;  Location: [facility] CATH LAB;  Service: Cardiovascular;  Laterality: N/A;  . LEFT HEART CATHETERIZATION WITH CORONARY ANGIOGRAM N/A 06/11/2013   Procedure: LEFT HEART CATHETERIZATION WITH CORONARY ANGIOGRAM;  Surgeon: Wellington Hampshire, MD;  Location: [facility] CATH LAB;  Service: Cardiovascular;  Laterality: N/A;  . PACEMAKER INSERTION  12/17/12   MDT Adapta L implanted by Dr Rayann Heman for mobitz II second degree AV block  . PERCUTANEOUS CORONARY STENT INTERVENTION (PCI-S)  10/17/2012   Procedure: PERCUTANEOUS CORONARY STENT INTERVENTION (PCI-S);  Surgeon: Josue Hector, MD;  Location: [facility] CATH LAB;  Service: Cardiovascular;;  . PERMANENT PACEMAKER INSERTION N/A 12/17/2012   Procedure: PERMANENT PACEMAKER INSERTION;  Surgeon: Thompson Grayer, MD;  Location: [facility] CATH LAB;  Service: Cardiovascular;  Laterality: N/A;  . POLYPECTOMY    . UPPER GASTROINTESTINAL ENDOSCOPY      Social History   Social History  . Marital status: Married    Spouse name: N/A  . Number of children: 3  . Years of education: N/A   Occupational History  . Not on file.   Social History Main Topics  . Smoking status: Former Smoker    Packs/day: 2.50    Years: 40.00    Types: Cigarettes    Start date: 04/18/1967    Quit date: 12/20/2003  . Smokeless tobacco: Former Systems developer  . Alcohol use 0.6 oz/week    1 Shots of liquor per week     Comment: occasional  . Drug use: Yes    Types: Marijuana     Comment: 30 + years ago - "crank, marjiuanna, ect."  . Sexual activity: Yes    Birth control/ protection: Surgical   Other Topics Concern  . Not on file   Social History Narrative  . No narrative on file     Vitals:   08/07/16 1100  BP: (!) 150/78  Pulse: 70  SpO2: 97%  Weight: 220 lb (99.8 kg)  Height: 6' (1.829 m)    Wt Readings from Last 3 Encounters:  08/07/16 220 lb (99.8 kg)  06/21/16 223 lb (101.2 kg)  06/01/16 232 lb (105.2 kg)     PHYSICAL EXAM General: NAD HEENT:  Normal. Neck: No JVD, no thyromegaly. Lungs: Clear to auscultation bilaterally with normal respiratory effort. CV: Nondisplaced PMI.  Regular rate and rhythm, normal S1/S2, no S3/S4, no murmur. No pretibial or periankle edema.  Abdomen: Soft, nontender, no distention.  Neurologic: Alert and oriented.  Psych: Normal affect. Skin: Normal. Musculoskeletal: No gross deformities.    ECG: Most recent ECG reviewed.   Labs: Lab Results  Component Value Date/Time   K 4.3 02/24/2016 11:31 AM   BUN 12 02/24/2016 11:31 AM   BUN 11 01/06/2016 08:13 AM  CREATININE 0.95 02/24/2016 11:31 AM   ALT 33 01/06/2016 08:13 AM   TSH 0.99 10/08/2013 12:30 PM   HGB 14.6 05/23/2015 11:09 PM     Lipids: Lab Results  Component Value Date/Time   LDLCALC 63 01/06/2016 08:13 AM   CHOL 128 01/06/2016 08:13 AM   TRIG 103 01/06/2016 08:13 AM   HDL 44 01/06/2016 08:13 AM       ASSESSMENT AND PLAN:  1. CAD s/p PCI of RCA: Stress test reviewed above. Continue aspirin, beta blocker, and simvastatin.  2. Malignant HTN with chest tightness and headaches: Elevated. I will increase hydralazine to 75 mg three times daily. Already takes losartan 100 mg and nadolol 80 mg. I have asked the patient to check blood pressure readings 3-4 times per week (I will provide a log), at different times throughout the day, in order to get a better approximation of mean BP values. These results will be provided to me at the end of that period so that I can determine if antihypertensive medication titration is indicated.  3. Mobitz II AV block (symptomatic) s/p CRT-P revision at [facility]: Normal pacemaker function. Follows a EP.   4. Dietary and exercise counseling provided.   Disposition: Follow up 6 months  Kate Sable, M.D., F.A.C.C.

## 2016-08-11 ENCOUNTER — Other Ambulatory Visit: Payer: Self-pay | Admitting: Adult Health

## 2016-08-22 ENCOUNTER — Other Ambulatory Visit: Payer: Self-pay | Admitting: Family Medicine

## 2016-08-30 ENCOUNTER — Other Ambulatory Visit: Payer: Self-pay | Admitting: *Deleted

## 2016-08-30 MED ORDER — SCOPOLAMINE 1 MG/3DAYS TD PT72: 1.0000 | MEDICATED_PATCH | TRANSDERMAL | 0 refills | Status: DC

## 2016-09-19 ENCOUNTER — Encounter: Payer: Self-pay | Admitting: Nurse Practitioner

## 2016-09-19 ENCOUNTER — Ambulatory Visit (INDEPENDENT_AMBULATORY_CARE_PROVIDER_SITE_OTHER): Payer: BLUE CROSS/BLUE SHIELD | Admitting: Nurse Practitioner

## 2016-09-19 VITALS — BP 150/80 | Ht 72.0 in | Wt 222.2 lb

## 2016-09-19 DIAGNOSIS — J069 Acute upper respiratory infection, unspecified: Secondary | ICD-10-CM | POA: Diagnosis not present

## 2016-09-19 DIAGNOSIS — R062 Wheezing: Secondary | ICD-10-CM

## 2016-09-19 DIAGNOSIS — R0600 Dyspnea, unspecified: Secondary | ICD-10-CM

## 2016-09-19 DIAGNOSIS — R0609 Other forms of dyspnea: Secondary | ICD-10-CM

## 2016-09-19 DIAGNOSIS — B9689 Other specified bacterial agents as the cause of diseases classified elsewhere: Secondary | ICD-10-CM

## 2016-09-19 DIAGNOSIS — J209 Acute bronchitis, unspecified: Secondary | ICD-10-CM

## 2016-09-19 MED ORDER — ALBUTEROL SULFATE HFA 108 (90 BASE) MCG/ACT IN AERS: 2.0000 | INHALATION_SPRAY | RESPIRATORY_TRACT | 0 refills | Status: DC | PRN

## 2016-09-19 MED ORDER — PREDNISONE 20 MG PO TABS: ORAL_TABLET | ORAL | 0 refills | Status: DC

## 2016-09-19 MED ORDER — LEVOFLOXACIN 500 MG PO TABS: 500.0000 mg | ORAL_TABLET | Freq: Every day | ORAL | 0 refills | Status: DC

## 2016-09-19 MED ORDER — HYDROCODONE-HOMATROPINE 5-1.5 MG/5ML PO SYRP: 5.0000 mL | ORAL_SOLUTION | ORAL | 0 refills | Status: DC | PRN

## 2016-09-19 NOTE — Patient Instructions (Signed)
Check to see which of these meds are preferred on your insurance: (pharmacy may be able to tell you or call number on your card) [facility] Symbicort Advair

## 2016-09-20 ENCOUNTER — Encounter: Payer: Self-pay | Admitting: Nurse Practitioner

## 2016-09-20 NOTE — Progress Notes (Signed)
Subjective:  Presents for c/o sinus problems x 3 d. No fever. Sore throat improved. Maxillary area headache radiating into the teeth. Runny nose. Producing green to brown sputum. Frequent cough, much worse at night. Minimal sleep. Wheezing. Reflux stable. Non smoker but previous 100 pack year smoker. Has also been having some shortness of breath with activity before illness. Last nuclear stress test 06/23/2016. Regular follow-up with cardiology.  Objective:   BP (!) 150/80   Ht 6' (1.829 m)   Wt 222 lb 4 oz (100.8 kg)   BMI 30.14 kg/m  NAD. Alert, oriented. TMs retracted with mild erythema on the right. Pharynx injected with green PND noted. Neck supple with mild anterior adenopathy. Lungs initially coarse expiratory crackles throughout lung fields with expiratory wheezing. Cough with deep breath. No tachypnea. Given albuterol 2.5 mg nebulizer treatment. 1 faint wheeze left anterior upper lobe otherwise clear. Subjective improvement of symptoms. Improved airflow. Heart regular rate rhythm.  Assessment:   Problem List Items Addressed This Visit      Other   Exertional dyspnea    Other Visit Diagnoses    Bacterial upper respiratory infection    -  Primary   Acute bronchitis, unspecified organism       Wheezing           Plan:   Meds ordered this encounter  Medications  . albuterol (PROVENTIL HFA;VENTOLIN HFA) 108 (90 Base) MCG/ACT inhaler    Sig: Inhale 2 puffs into the lungs every 4 (four) hours as needed.    Dispense:  1 Inhaler    Refill:  0    Order Specific Question:   Supervising Provider    Answer:   Mikey Kirschner [[id number]]  . levofloxacin (LEVAQUIN) 500 MG tablet    Sig: Take 1 tablet (500 mg total) by mouth daily.    Dispense:  10 tablet    Refill:  0    Order Specific Question:   Supervising Provider    Answer:   Mikey Kirschner [[id number]]  . HYDROcodone-homatropine (HYCODAN) 5-1.5 MG/5ML syrup    Sig: Take 5 mLs by mouth every 4 (four) hours as needed.    Dispense:   90 mL    Refill:  0    Order Specific Question:   Supervising Provider    Answer:   Mikey Kirschner [[id number]]  . predniSONE (DELTASONE) 20 MG tablet    Sig: 2 po qd x 5 d    Dispense:  10 tablet    Refill:  0    Order Specific Question:   Supervising Provider    Answer:   Mikey Kirschner [[id number]]   Patient to check to see which inhaler is covered by his insurance as a daily preventive medicine for wheezing. Given a list of medications on his checkout sheet. Patient understands that albuterol is his rescue inhaler. Warning signs reviewed. Call back in 72 hours if no improvement in symptoms, sooner if worse.

## 2016-09-28 ENCOUNTER — Encounter: Payer: Self-pay | Admitting: Family Medicine

## 2016-09-29 ENCOUNTER — Other Ambulatory Visit: Payer: Self-pay | Admitting: *Deleted

## 2016-09-29 MED ORDER — HYDROCODONE-ACETAMINOPHEN 5-325 MG PO TABS: 1.0000 | ORAL_TABLET | Freq: Four times a day (QID) | ORAL | 0 refills | Status: DC | PRN

## 2016-09-29 NOTE — Telephone Encounter (Signed)
This patient has been able to tolerate hydrocodone without difficulty in the past. He may have hydrocodone 5 mg/325 mg one every 6 hours when necessary pain home use only #8 he would need to be seen for additional medication

## 2016-09-30 ENCOUNTER — Other Ambulatory Visit: Payer: Self-pay | Admitting: Cardiovascular Disease

## 2016-10-02 ENCOUNTER — Other Ambulatory Visit: Payer: Self-pay

## 2016-10-02 MED ORDER — NADOLOL 80 MG PO TABS: 80.0000 mg | ORAL_TABLET | Freq: Every day | ORAL | 3 refills | Status: DC

## 2016-10-02 NOTE — Telephone Encounter (Signed)
escribed nadolol to pharmacy, 80 mg

## 2016-10-11 ENCOUNTER — Other Ambulatory Visit: Payer: Self-pay | Admitting: Family Medicine

## 2016-10-11 ENCOUNTER — Other Ambulatory Visit: Payer: Self-pay | Admitting: Cardiovascular Disease

## 2016-10-17 ENCOUNTER — Other Ambulatory Visit: Payer: Self-pay | Admitting: Cardiovascular Disease

## 2016-11-28 ENCOUNTER — Ambulatory Visit (INDEPENDENT_AMBULATORY_CARE_PROVIDER_SITE_OTHER): Payer: BLUE CROSS/BLUE SHIELD | Admitting: Family Medicine

## 2016-11-28 ENCOUNTER — Ambulatory Visit (HOSPITAL_COMMUNITY)
Admission: RE | Admit: 2016-11-28 | Discharge: 2016-11-28 | Disposition: A | Discharge status: Home / Self Care | Payer: BLUE CROSS/BLUE SHIELD | Source: Ambulatory Visit | Attending: Family Medicine | Admitting: Family Medicine

## 2016-11-28 ENCOUNTER — Other Ambulatory Visit: Payer: Self-pay | Admitting: Family Medicine

## 2016-11-28 ENCOUNTER — Other Ambulatory Visit (HOSPITAL_COMMUNITY)
Admission: RE | Admit: 2016-11-28 | Discharge: 2016-11-28 | Disposition: A | Discharge status: Home / Self Care | Payer: BLUE CROSS/BLUE SHIELD | Source: Ambulatory Visit | Attending: Family Medicine | Admitting: Family Medicine

## 2016-11-28 ENCOUNTER — Encounter: Payer: Self-pay | Admitting: Family Medicine

## 2016-11-28 VITALS — BP 154/86 | Ht 71.0 in | Wt 220.1 lb

## 2016-11-28 DIAGNOSIS — R0609 Other forms of dyspnea: Secondary | ICD-10-CM | POA: Diagnosis not present

## 2016-11-28 DIAGNOSIS — R0600 Dyspnea, unspecified: Secondary | ICD-10-CM

## 2016-11-28 DIAGNOSIS — I10 Essential (primary) hypertension: Secondary | ICD-10-CM

## 2016-11-28 DIAGNOSIS — R5383 Other fatigue: Secondary | ICD-10-CM

## 2016-11-28 DIAGNOSIS — R0602 Shortness of breath: Secondary | ICD-10-CM | POA: Diagnosis not present

## 2016-11-28 LAB — D-DIMER, QUANTITATIVE (NOT AT ARMC): D-Dimer, Quant: <0.27 ug/mL-FEU (ref 0.00–0.50)

## 2016-11-28 LAB — POCT HEMOGLOBIN: Hemoglobin: 16.1 g/dL (ref 14.1–18.1)

## 2016-11-28 MED ORDER — TAMSULOSIN HCL 0.4 MG PO CAPS: 0.8000 mg | ORAL_CAPSULE | Freq: Every day | ORAL | 11 refills | Status: DC

## 2016-11-28 MED ORDER — AMLODIPINE BESYLATE 2.5 MG PO TABS: 2.5000 mg | ORAL_TABLET | Freq: Every day | ORAL | 5 refills | Status: DC

## 2016-11-28 NOTE — Patient Instructions (Signed)

## 2016-11-28 NOTE — Progress Notes (Signed)
   Subjective:    Patient ID: [name], male    DOB: [birth year], 61 y.o.   MRN: [id number]  Neck Pain   This is a recurrent problem.  He has had significant neck pain for quite some time describes as aching sensation radiates into both shoulders. Does not radiate down the legs. Is trying yoga for it. He once make sure that yoga is okay he is reasonable with his poses   Lower back pain. Mainly in the low back region on the left side some radiation down the legs he does not think it's a nerve he denies loss of leg use denies weakness. States he's doing some stretching it seems to help to some degree  Elevated blood pressure-he states his been consistently running in the 150s recently and had medication adjusted by his cardiologist earlier this spring patient does state he gets out of breath with activity especially walking up the slope he also states he experiences intermittent chest tightness and pressure and discomfort in addition to this he states at times even feel short of breath sitting still. Occasionally gets sharp pains into his chest.   Shortness of breath. Light headed. For the last couple of weeks. Patient notices shortness of breath seems to be getting worse especially with exertion but denies currently having any type of pain or discomfort   Review of Systems  Musculoskeletal: Positive for neck pain.  Please see above denies coughing denies wheezing does have a large smoking history of greater than 30-pack-year he quit back in 2005 Results for orders placed or performed in visit on 11/28/16  POCT hemoglobin  Result Value Ref Range   Hemoglobin 16.1 14.1 - 18.1 g/dL   Denies hemoptysis denies weight loss bowels moving fine no abdominal pain no headaches    Objective:   Physical Exam Alert oriented Lungs clear no crackles Heart regular Blood pressure checked twice best reading 158/86 Extremities no edema no infection Skin warm dry Pulses normal Neurologic grossly  normal Subjected discomfort in his neck Subjective discomfort low back negative sciatica   25 minutes was spent with the patient. Greater than half the time was spent in discussion and answering questions and counseling regarding the issues that the patient came in for today.     Assessment & Plan:  Low back could be muscle could be ligament could be strain possibility of a herniated disc as well stretches were shown hold off on any imaging follow-up if not better within the next 8 weeks sooner problems  Chronic neck pain with disc problems yoga is fine within reason  Dyspnea on exertion-it is possible this could be a angina equivalent it is reasonable to add amlodipine low-dose 2.5 mg as tolerated we will bump up this dose patient is due for a follow-up with his cardiologist. A copy of this note will be sent to the cardiologist.  We will also make sure that this is not a pulmonary contribution to his dyspnea therefore chest x-ray pulmonary function testing. Await the results. Also d-dimer to rule out unlikely possibility of a pulmonary embolus  If all of these testing point away from pulmonary in his symptoms persist patient may need to have repeat catheterization

## 2016-11-28 NOTE — Telephone Encounter (Signed)
Please make the refill for 12 refills

## 2016-12-01 ENCOUNTER — Telehealth: Payer: Self-pay | Admitting: Cardiovascular Disease

## 2016-12-01 MED ORDER — NADOLOL 80 MG PO TABS: 80.0000 mg | ORAL_TABLET | Freq: Every day | ORAL | 3 refills | Status: DC

## 2016-12-01 NOTE — Telephone Encounter (Signed)
Spoke with wife and she said that patient uses [facility] not [facility]. New rx sent to [facility].

## 2016-12-01 NOTE — Telephone Encounter (Signed)
New message     Pt c/o medication issue:  1. Name of Medication: nadolol 80 mg  2. How are you currently taking this medication (dosage and times per day)? Once daily  3. Are you having a reaction (difficulty breathing--STAT)? no  4. What is your medication issue? Pt wife is asking if he should be taking 55 because his pharmacy is only filling 40mg . She said if she doubles up, he will not have enough to last him a month.

## 2016-12-08 ENCOUNTER — Ambulatory Visit (HOSPITAL_COMMUNITY)
Admission: RE | Admit: 2016-12-08 | Discharge: 2016-12-08 | Disposition: A | Discharge status: Home / Self Care | Payer: BLUE CROSS/BLUE SHIELD | Source: Ambulatory Visit | Attending: Family Medicine | Admitting: Family Medicine

## 2016-12-08 DIAGNOSIS — J984 Other disorders of lung: Secondary | ICD-10-CM | POA: Diagnosis not present

## 2016-12-08 DIAGNOSIS — R0609 Other forms of dyspnea: Secondary | ICD-10-CM | POA: Diagnosis not present

## 2016-12-08 LAB — PULMONARY FUNCTION TEST
DL/VA % pred: 100 %
DL/VA: 4.75 ml/min/mmHg/L
DLCO cor % pred: 68 %
DLCO cor: 24.01 ml/min/mmHg
DLCO unc % pred: 68 %
DLCO unc: 24.01 ml/min/mmHg
FEF 25-75 Post: 2.83 L/sec
FEF 25-75 Pre: 2.71 L/sec
FEF2575-%Change-Post: 4 %
FEF2575-%Pred-Post: 92 %
FEF2575-%Pred-Pre: 88 %
FEV1-%Change-Post: 1 %
FEV1-%Pred-Post: 71 %
FEV1-%Pred-Pre: 70 %
FEV1-Post: 2.7 L
FEV1-Pre: 2.66 L
FEV1FVC-%Change-Post: -1 %
FEV1FVC-%Pred-Pre: 107 %
FEV6-%Change-Post: 2 %
FEV6-%Pred-Post: 70 %
FEV6-%Pred-Pre: 68 %
FEV6-Post: 3.38 L
FEV6-Pre: 3.29 L
FEV6FVC-%Pred-Post: 105 %
FEV6FVC-%Pred-Pre: 105 %
FVC-%Change-Post: 2 %
FVC-%Pred-Post: 66 %
FVC-%Pred-Pre: 65 %
FVC-Post: 3.38 L
FVC-Pre: 3.29 L
Post FEV1/FVC ratio: 80 %
Post FEV6/FVC ratio: 100 %
Pre FEV1/FVC ratio: 81 %
Pre FEV6/FVC Ratio: 100 %
RV % pred: 96 %
RV: 2.31 L
TLC % pred: 73 %
TLC: 5.44 L

## 2016-12-08 MED ORDER — ALBUTEROL SULFATE (2.5 MG/3ML) 0.083% IN NEBU
2.5000 mg | INHALATION_SOLUTION | Freq: Once | RESPIRATORY_TRACT | Status: AC
  Administered 2016-12-08: 2.5 mg via RESPIRATORY_TRACT

## 2016-12-14 ENCOUNTER — Encounter: Payer: Self-pay | Admitting: Family Medicine

## 2016-12-14 NOTE — Addendum Note (Signed)
Addended by: Karle Barr on: 12/14/2016 11:42 AM   Modules accepted: Orders

## 2016-12-15 DIAGNOSIS — M9903 Segmental and somatic dysfunction of lumbar region: Secondary | ICD-10-CM | POA: Diagnosis not present

## 2016-12-15 DIAGNOSIS — M546 Pain in thoracic spine: Secondary | ICD-10-CM | POA: Diagnosis not present

## 2016-12-15 DIAGNOSIS — M5442 Lumbago with sciatica, left side: Secondary | ICD-10-CM | POA: Diagnosis not present

## 2016-12-15 DIAGNOSIS — M9902 Segmental and somatic dysfunction of thoracic region: Secondary | ICD-10-CM | POA: Diagnosis not present

## 2016-12-20 DIAGNOSIS — M9903 Segmental and somatic dysfunction of lumbar region: Secondary | ICD-10-CM | POA: Diagnosis not present

## 2016-12-20 DIAGNOSIS — M546 Pain in thoracic spine: Secondary | ICD-10-CM | POA: Diagnosis not present

## 2016-12-20 DIAGNOSIS — M9902 Segmental and somatic dysfunction of thoracic region: Secondary | ICD-10-CM | POA: Diagnosis not present

## 2016-12-20 DIAGNOSIS — M5442 Lumbago with sciatica, left side: Secondary | ICD-10-CM | POA: Diagnosis not present

## 2017-01-02 ENCOUNTER — Ambulatory Visit (INDEPENDENT_AMBULATORY_CARE_PROVIDER_SITE_OTHER): Payer: BLUE CROSS/BLUE SHIELD | Admitting: Pulmonary Disease

## 2017-01-02 ENCOUNTER — Encounter: Payer: Self-pay | Admitting: Pulmonary Disease

## 2017-01-02 VITALS — BP 148/86 | HR 66 | Ht 71.0 in | Wt 221.0 lb

## 2017-01-02 DIAGNOSIS — J986 Disorders of diaphragm: Secondary | ICD-10-CM | POA: Diagnosis not present

## 2017-01-02 DIAGNOSIS — R0600 Dyspnea, unspecified: Secondary | ICD-10-CM | POA: Diagnosis not present

## 2017-01-02 NOTE — Patient Instructions (Signed)
For your shortness of breath: I believe this is due to left hemidiaphragm paralysis You should discuss this more with your neurosurgeon when you see them next for your neck pain We will arrange for something called a diaphragm "sniff test" to evaluate your left hemidiaphragms ability to move You should try to exercise and lose weight as much as possible We will check a walking test today to check your oxygen level as she walk around  For being overweight: The following behaviors have been associated with weight loss: Weigh yourself daily Write down everything you eat Drink a glass of water prior to eating a meal  For your labile blood pressure: I recommend you follow-up with cardiology  We will arrange for another visit in 3 months to see if things are improving with weight loss

## 2017-01-02 NOTE — Progress Notes (Signed)
Subjective:    Patient ID: [name], male    DOB: 07-02-1955, 61 y.o.   MRN: [id number]  Synopsis: former patient of Dr. Gwenette Greet with OSA; CAD, s/p PCI  HPI Chief Complaint  Patient presents with  . Advice Only    former [place] pt re-referred to clinic for worsening DOE X1 year.    [name] is here to see me for shortness of breath.  > he says its worse when he is lying flat > sometimes while dozing off he will choke and wil need to catch his breath > while staying active he will fee like he is not getting enough air in his lungs > he says the symptoms wax and wane, unpredictably > he can walk as much as 5-6 miles some days and have not have any trouble > he says that the dyspnea is worsening > he is the owner of a vape store, used to vape sometimes, now  > not associated with pain  > he "always feels tight"  Cough: > very rare, always dry   Tobacco use > used to smoke 3 ppd for 40 years > smoked pipe, cigars > quit smoking cigarettes around 2008  Vape  > average mL day is 1-59mL > mg nicotine   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  He always worse a mask.  One day he says he had some scarring in his lung from an "acidic dust" a long time ago.  Grew up in [place].  Childhood was normal, no asthma.  Sibling had asthma.     Past Medical History:  Diagnosis Date  . ADD (attention deficit disorder)    Rx-Adderall  . Anginal pain ([place])   . AV block, 3rd degree (HCC)   . Colon polyps 2011   tubular adenoma by excisional biopsy during colonoscopy  . Complete heart block ([place])   . Coronary artery disease   . Degenerative disc disease, cervical   . GERD (gastroesophageal reflux disease)   . Hypertension   . Pacemaker   . Palpitations 2003   Holter in 2003: PACs and PVCs; negative stress nuclear test in 2005; right bundle branch block; echo in 2008-mild LVH, otherwise normal; 2008-negative stress nuclear test.    . Pneumonia   . Prostatitis    prostate calcifications by CT  . Sleep apnea    questionable diagnosis     Family History  Problem Relation Age of Onset  . Heart disease Mother   . Hypertension Mother        Carotid disease  . Prostate cancer Brother   . Breast cancer Maternal Aunt   . Colon cancer Neg Hx   . Colon polyps Neg Hx      Social History   Social History  . Marital status: Married    Spouse name: N/A  . Number of children: 3  . Years of education: N/A   Occupational History  . Not on file.   Social History Main Topics  . Smoking status: Former Smoker    Packs/day: 2.50    Years: 40.00    Types: Cigarettes    Start date: 04/18/1967    Quit date: 12/20/2003  . Smokeless tobacco: Former Systems developer  . Alcohol use 0.6 oz/week    1 Shots of liquor per week     Comment: occasional  . Drug use: Yes    Types: Marijuana     Comment: 30 + years ago - "  crank, marjiuanna, ect."  . Sexual activity: Yes    Birth control/ protection: Surgical   Other Topics Concern  . Not on file   Social History Narrative  . No narrative on file     Allergies  Allergen Reactions  . Morphine And Related Itching and Rash    redness  . Lasix [Furosemide]     Chest pain and tightness  . Latex Rash     Outpatient Medications Prior to Visit  Medication Sig Dispense Refill  . amLODipine (NORVASC) 2.5 MG tablet Take 1 tablet (2.5 mg total) by mouth daily. 30 tablet 5  . aspirin 81 MG chewable tablet Chew 81 mg by mouth daily.    . hydrALAZINE (APRESOLINE) 50 MG tablet Take 1.5 tablets (75 mg total) by mouth 3 (three) times daily. 405 tablet 3  . losartan (COZAAR) 100 MG tablet take 1 tablet by mouth once daily 90 tablet 3  . nadolol (CORGARD) 80 MG tablet Take 1 tablet (80 mg total) by mouth daily. 90 tablet 3  . nitroGLYCERIN (NITROSTAT) 0.4 MG SL tablet Place 1 tablet (0.4 mg total) under the tongue every 5 (five) minutes as needed for chest pain (MAX 3 TABLETS). 25 tablet 3  .  pantoprazole (PROTONIX) 40 MG tablet take 1 tablet by mouth once daily 90 tablet 0  . scopolamine (TRANSDERM-SCOP, 1.5 MG,) 1 MG/3DAYS Place 1 patch (1.5 mg total) onto the skin every 3 (three) days. 4 patch 0  . simvastatin (ZOCOR) 10 MG tablet take 1 tablet by mouth every evening 30 tablet 6  . tamsulosin (FLOMAX) 0.4 MG CAPS capsule Take 2 capsules (0.8 mg total) by mouth daily. 60 capsule 11  . albuterol (PROVENTIL HFA;VENTOLIN HFA) 108 (90 Base) MCG/ACT inhaler Inhale 2 puffs into the lungs every 4 (four) hours as needed. (Patient not taking: Reported on 11/28/2016) 1 Inhaler 0   Facility-Administered Medications Prior to Visit  Medication Dose Route Frequency Provider Last Rate Last Dose  . 0.9 %  sodium chloride infusion  500 mL Intravenous Continuous Ladene Artist, MD          Review of Systems  Constitutional: Negative for fever and unexpected weight change.  HENT: Negative for congestion, dental problem, ear pain, nosebleeds, postnasal drip, rhinorrhea, sinus pressure, sneezing, sore throat and trouble swallowing.   Eyes: Negative for redness and itching.  Respiratory: Positive for chest tightness and shortness of breath. Negative for cough and wheezing.   Cardiovascular: Negative for palpitations and leg swelling.  Gastrointestinal: Negative for nausea and vomiting.  Genitourinary: Negative for dysuria.  Musculoskeletal: Negative for joint swelling.  Skin: Negative for rash.  Neurological: Negative for headaches.  Hematological: Does not bruise/bleed easily.  Psychiatric/Behavioral: Negative for dysphoric mood. The patient is not nervous/anxious.        Objective:   Physical Exam  Vitals:   01/02/17 0908  BP: (!) 148/86  Pulse: 66  SpO2: 96%  Weight: 221 lb (100.2 kg)  Height: 5\' 11"  (1.803 m)    Gen: well appearing, no acute distress HENT: NCAT, OP clear, neck supple without masses Eyes: PERRL, EOMi Lymph: no cervical lymphadenopathy PULM: CTA B CV: RRR, no  mgr, no JVD GI: BS+, soft, nontender, no hsm Derm: no rash or skin breakdown MSK: normal bulk and tone Neuro: A&Ox4, CN II-XII intact, strength 5/5 in all 4 extremities Psyche: normal mood and affect   CBC    Component Value Date/Time   WBC 6.3 05/23/2015 2309   RBC 4.97  05/23/2015 2309   HGB 16.1 11/28/2016 1127   HGB 14.6 05/23/2015 2309   HGB 16.1 12/14/2014 0855   HCT 43.8 05/23/2015 2309   HCT 47.8 12/14/2014 0855   PLT 198 05/23/2015 2309   PLT 219 12/14/2014 0855   MCV 88.1 05/23/2015 2309   MCV 88 12/14/2014 0855   MCH 29.4 05/23/2015 2309   MCHC 33.3 05/23/2015 2309   RDW 13.0 05/23/2015 2309   RDW 13.3 12/14/2014 0855   LYMPHSABS 1.7 12/14/2014 0855   MONOABS 0.5 10/08/2013 1230   EOSABS 0.2 12/14/2014 0855   BASOSABS 0.0 12/14/2014 0855   BMET    Component Value Date/Time   NA 140 02/24/2016 1131   NA 142 01/06/2016 0813   K 4.3 02/24/2016 1131   CL 104 02/24/2016 1131   CO2 25 02/24/2016 1131   GLUCOSE 93 02/24/2016 1131   BUN 12 02/24/2016 1131   BUN 11 01/06/2016 0813   CREATININE 0.95 02/24/2016 1131   CALCIUM 9.5 02/24/2016 1131   GFRNONAA 88 01/06/2016 0813   GFRAA 102 01/06/2016 0813   Sleep study: July 2015 read by Dr. Gwenette Greet: Mild to moderate obstructive and central sleep apnea, AHI 18, O2 saturation as low as 85% Records reviewed: 2015 August visit with Dr. Gwenette Greet: Recommended weight loss, consider dental appliance or CPAP.      Assessment & Plan:   Hemidiaphragm paralysis - Plan: DG Sniff Test  Discussion: Traveon is here to see me for dyspnea. Objectively, he has her strict of lung disease seen on lung function testing: However there is no evidence of diffusion abnormality to suggest a pulmonary parenchymal abnormality. I personally reviewed the images from his July 2015 CT chest and saw no evidence of pulmonary parenchymal disease there. His 2018 chest x-ray suggested some "scarring in the left lung", but I think this is just  atelectasis related to left hemidiaphragm elevation.  I think his left hemidiaphragm is paralyzed, we will get a sniff test to try to evaluate this further. There is really no good surgical options for this at this point. I have advised exercise and weight loss.  Fortunately, there is no evidence of COPD or interstitial lung disease despite his prior heavy smoking history. I congratulated him" expecting.  While I believe the majority of his dyspnea is due to eating overweight and having a paralyzed left hemidiaphragm, I do wonder if his dyspnea is related in part to his labile hypertension.  Plan: For your shortness of breath: I believe this is due to left hemidiaphragm paralysis You should discuss this more with your neurosurgeon when you see them next for your neck pain We will arrange for something called a diaphragm "sniff test" to evaluate your left hemidiaphragms ability to move You should try to exercise and lose weight as much as possible We will check a walking test today to check your oxygen level as she walk around  For being overweight: The following behaviors have been associated with weight loss: Weigh yourself daily Write down everything you eat Drink a glass of water prior to eating a meal  For your labile blood pressure: I recommend you follow-up with cardiology  We will arrange for another visit in 3 months to see if things are improving with weight loss    Current Outpatient Prescriptions:  .  amLODipine (NORVASC) 2.5 MG tablet, Take 1 tablet (2.5 mg total) by mouth daily., Disp: 30 tablet, Rfl: 5 .  aspirin 81 MG chewable tablet, Chew 81 mg by  mouth daily., Disp: , Rfl:  .  hydrALAZINE (APRESOLINE) 50 MG tablet, Take 1.5 tablets (75 mg total) by mouth 3 (three) times daily., Disp: 405 tablet, Rfl: 3 .  losartan (COZAAR) 100 MG tablet, take 1 tablet by mouth once daily, Disp: 90 tablet, Rfl: 3 .  nadolol (CORGARD) 80 MG tablet, Take 1 tablet (80 mg total) by mouth  daily., Disp: 90 tablet, Rfl: 3 .  nitroGLYCERIN (NITROSTAT) 0.4 MG SL tablet, Place 1 tablet (0.4 mg total) under the tongue every 5 (five) minutes as needed for chest pain (MAX 3 TABLETS)., Disp: 25 tablet, Rfl: 3 .  pantoprazole (PROTONIX) 40 MG tablet, take 1 tablet by mouth once daily, Disp: 90 tablet, Rfl: 0 .  scopolamine (TRANSDERM-SCOP, 1.5 MG,) 1 MG/3DAYS, Place 1 patch (1.5 mg total) onto the skin every 3 (three) days., Disp: 4 patch, Rfl: 0 .  simvastatin (ZOCOR) 10 MG tablet, take 1 tablet by mouth every evening, Disp: 30 tablet, Rfl: 6 .  tamsulosin (FLOMAX) 0.4 MG CAPS capsule, Take 2 capsules (0.8 mg total) by mouth daily., Disp: 60 capsule, Rfl: 11  Current Facility-Administered Medications:  .  0.9 %  sodium chloride infusion, 500 mL, Intravenous, Continuous, Ladene Artist, MD

## 2017-01-04 ENCOUNTER — Ambulatory Visit (HOSPITAL_COMMUNITY)
Admission: RE | Admit: 2017-01-04 | Discharge: 2017-01-04 | Disposition: A | Discharge status: Home / Self Care | Payer: BLUE CROSS/BLUE SHIELD | Source: Ambulatory Visit | Attending: Pulmonary Disease | Admitting: Pulmonary Disease

## 2017-01-04 DIAGNOSIS — J986 Disorders of diaphragm: Secondary | ICD-10-CM | POA: Diagnosis not present

## 2017-01-04 DIAGNOSIS — R0600 Dyspnea, unspecified: Secondary | ICD-10-CM | POA: Diagnosis not present

## 2017-01-04 DIAGNOSIS — Z95810 Presence of automatic (implantable) cardiac defibrillator: Secondary | ICD-10-CM | POA: Diagnosis not present

## 2017-01-08 ENCOUNTER — Encounter: Payer: Self-pay | Admitting: Nurse Practitioner

## 2017-01-08 NOTE — Telephone Encounter (Signed)
Spoke with pt informed him that a sensation of a tingling feeling is not typically associated with a pacemaker malfunction informed pt that typically symptoms of a pacemaker malfunction are dizziness and syncope because th pacemaker is not pacing his heart when it is supposed to. Pt to attempt to send in a manual transmission tonight, if unable to get transmission to go through pt needs to be seen in device clinic d/t last check was 04/2016.  Number given to device clinic for pt to call and verify that transmission was received.

## 2017-02-01 DIAGNOSIS — L918 Other hypertrophic disorders of the skin: Secondary | ICD-10-CM | POA: Diagnosis not present

## 2017-02-01 DIAGNOSIS — L821 Other seborrheic keratosis: Secondary | ICD-10-CM | POA: Diagnosis not present

## 2017-02-01 DIAGNOSIS — L570 Actinic keratosis: Secondary | ICD-10-CM | POA: Diagnosis not present

## 2017-02-01 DIAGNOSIS — D225 Melanocytic nevi of trunk: Secondary | ICD-10-CM | POA: Diagnosis not present

## 2017-02-15 ENCOUNTER — Ambulatory Visit (INDEPENDENT_AMBULATORY_CARE_PROVIDER_SITE_OTHER): Payer: BLUE CROSS/BLUE SHIELD | Admitting: Cardiovascular Disease

## 2017-02-15 ENCOUNTER — Encounter: Payer: Self-pay | Admitting: Cardiovascular Disease

## 2017-02-15 VITALS — BP 160/84 | HR 64 | Ht 72.0 in | Wt 219.0 lb

## 2017-02-15 DIAGNOSIS — R0600 Dyspnea, unspecified: Secondary | ICD-10-CM

## 2017-02-15 DIAGNOSIS — R0609 Other forms of dyspnea: Secondary | ICD-10-CM | POA: Diagnosis not present

## 2017-02-15 DIAGNOSIS — I10 Essential (primary) hypertension: Secondary | ICD-10-CM | POA: Diagnosis not present

## 2017-02-15 DIAGNOSIS — Z950 Presence of cardiac pacemaker: Secondary | ICD-10-CM | POA: Diagnosis not present

## 2017-02-15 DIAGNOSIS — I25708 Atherosclerosis of coronary artery bypass graft(s), unspecified, with other forms of angina pectoris: Secondary | ICD-10-CM

## 2017-02-15 MED ORDER — AMLODIPINE BESYLATE 5 MG PO TABS: 5.0000 mg | ORAL_TABLET | Freq: Every day | ORAL | 3 refills | Status: DC

## 2017-02-15 NOTE — Patient Instructions (Signed)
Your physician wants you to follow-up in:  6 months with Dr.Koneswaran You will receive a reminder letter in the mail two months in advance. If you don't receive a letter, please call our office to schedule the follow-up appointment.    INCREASE Amlodipine to 5 mg daily   If you need a refill on your cardiac medications before your next appointment, please call your pharmacy.   No lab work or tests ordered today.      Thank you for choosing [facility] !

## 2017-02-15 NOTE — Progress Notes (Signed)
SUBJECTIVE: The patient presents for routine follow-up.  He was evaluated by pulmonology (Dr. Lake Bells) on 01/02/17.  Shortness of breath appears to be due to left hemidiaphragm paralysis.  He has no evidence of COPD or interstitial lung disease.  He has a history of coronary artery disease with a history of right coronary artery stent placement, along with malignant hypertension. He also has a pacemaker and follows with EP.  He underwent a low risk nuclear stress test on 06/23/16. There was a very small ischemic territory noted in the apical inferolateral wall. Diaphragmatic attenuation was also noted.  He is doing well.  He denies chest pain and shortness of breath.  He seldom has palpitations.  He seldom has lightheadedness which is chronic.  Blood pressures have gotten as high as 180/90 at home. He has been walking quite a bit.  He has also been adhering to a ketogenic diet for the past month and has lost 10 pounds.  He also practices yoga.     Review of Systems: As per "subjective", otherwise negative.  Allergies  Allergen Reactions  . Morphine And Related Itching and Rash    redness  . Lasix [Furosemide]     Chest pain and tightness  . Latex Rash    Current Outpatient Prescriptions  Medication Sig Dispense Refill  . amLODipine (NORVASC) 2.5 MG tablet Take 1 tablet (2.5 mg total) by mouth daily. 30 tablet 5  . aspirin 81 MG chewable tablet Chew 81 mg by mouth daily.    . hydrALAZINE (APRESOLINE) 50 MG tablet Take 1.5 tablets (75 mg total) by mouth 3 (three) times daily. 405 tablet 3  . losartan (COZAAR) 100 MG tablet take 1 tablet by mouth once daily 90 tablet 3  . nadolol (CORGARD) 80 MG tablet Take 1 tablet (80 mg total) by mouth daily. 90 tablet 3  . nitroGLYCERIN (NITROSTAT) 0.4 MG SL tablet Place 1 tablet (0.4 mg total) under the tongue every 5 (five) minutes as needed for chest pain (MAX 3 TABLETS). 25 tablet 3  . pantoprazole (PROTONIX) 40 MG tablet take 1 tablet by  mouth once daily 90 tablet 0  . scopolamine (TRANSDERM-SCOP, 1.5 MG,) 1 MG/3DAYS Place 1 patch (1.5 mg total) onto the skin every 3 (three) days. 4 patch 0  . simvastatin (ZOCOR) 10 MG tablet take 1 tablet by mouth every evening 30 tablet 6  . tamsulosin (FLOMAX) 0.4 MG CAPS capsule Take 2 capsules (0.8 mg total) by mouth daily. 60 capsule 11   Current Facility-Administered Medications  Medication Dose Route Frequency Provider Last Rate Last Dose  . 0.9 %  sodium chloride infusion  500 mL Intravenous Continuous Ladene Artist, MD        Past Medical History:  Diagnosis Date  . ADD (attention deficit disorder)    Rx-Adderall  . Anginal pain ([place])   . AV block, 3rd degree (HCC)   . Colon polyps 2011   tubular adenoma by excisional biopsy during colonoscopy  . Complete heart block ([place])   . Coronary artery disease   . Degenerative disc disease, cervical   . GERD (gastroesophageal reflux disease)   . Hypertension   . Pacemaker   . Palpitations 2003   Holter in 2003: PACs and PVCs; negative stress nuclear test in 2005; right bundle branch block; echo in 2008-mild LVH, otherwise normal; 2008-negative stress nuclear test.  . Pneumonia   . Prostatitis    prostate calcifications by CT  . Sleep apnea  questionable diagnosis    Past Surgical History:  Procedure Laterality Date  . ANTERIOR CERVICAL DECOMP/DISCECTOMY FUSION    . Biventricular pacemaker upgrade/ system revision  04/02/14   removal of previous atrial and ventricular leads with placement of a new MDT Consulta CRT-P system at [facility] by Dr Violet Baldy  . COLONOSCOPY    . COLONOSCOPY W/ POLYPECTOMY  2011   tubular adenoma  . ESOPHAGOGASTRODUODENOSCOPY     Gastritis  . KNEE SURGERY     rt  . LEFT HEART CATHETERIZATION WITH CORONARY ANGIOGRAM N/A 10/17/2012   Procedure: LEFT HEART CATHETERIZATION WITH CORONARY ANGIOGRAM;  Surgeon: Josue Hector, MD;  Location: [facility] CATH LAB;  Service: Cardiovascular;   Laterality: N/A;  . LEFT HEART CATHETERIZATION WITH CORONARY ANGIOGRAM N/A 12/17/2012   Procedure: LEFT HEART CATHETERIZATION WITH CORONARY ANGIOGRAM;  Surgeon: Peter M Martinique, MD;  Location: [facility] CATH LAB;  Service: Cardiovascular;  Laterality: N/A;  . LEFT HEART CATHETERIZATION WITH CORONARY ANGIOGRAM N/A 06/11/2013   Procedure: LEFT HEART CATHETERIZATION WITH CORONARY ANGIOGRAM;  Surgeon: Wellington Hampshire, MD;  Location: [facility] CATH LAB;  Service: Cardiovascular;  Laterality: N/A;  . PACEMAKER INSERTION  12/17/12   MDT Adapta L implanted by Dr Rayann Heman for mobitz II second degree AV block  . PERCUTANEOUS CORONARY STENT INTERVENTION (PCI-S)  10/17/2012   Procedure: PERCUTANEOUS CORONARY STENT INTERVENTION (PCI-S);  Surgeon: Josue Hector, MD;  Location: [facility] CATH LAB;  Service: Cardiovascular;;  . PERMANENT PACEMAKER INSERTION N/A 12/17/2012   Procedure: PERMANENT PACEMAKER INSERTION;  Surgeon: Thompson Grayer, MD;  Location: [facility] CATH LAB;  Service: Cardiovascular;  Laterality: N/A;  . POLYPECTOMY    . UPPER GASTROINTESTINAL ENDOSCOPY      Social History   Social History  . Marital status: Married    Spouse name: N/A  . Number of children: 3  . Years of education: N/A   Occupational History  . Not on file.   Social History Main Topics  . Smoking status: Former Smoker    Packs/day: 2.50    Years: 40.00    Types: Cigarettes    Start date: 04/18/1967    Quit date: 12/20/2003  . Smokeless tobacco: Former Systems developer  . Alcohol use 0.6 oz/week    1 Shots of liquor per week     Comment: occasional  . Drug use: Yes    Types: Marijuana     Comment: 30 + years ago - "crank, marjiuanna, ect."  . Sexual activity: Yes    Birth control/ protection: Surgical   Other Topics Concern  . Not on file   Social History Narrative  . No narrative on file     Vitals:   02/15/17 1110  BP: (!) 160/84  Pulse: 64  SpO2: 96%  Weight: 219 lb (99.3 kg)  Height: 6' (1.829 m)    Wt Readings from Last 3 Encounters:    02/15/17 219 lb (99.3 kg)  01/02/17 221 lb (100.2 kg)  11/28/16 220 lb 0.8 oz (99.8 kg)     PHYSICAL EXAM General: NAD HEENT: Normal. Neck: No JVD, no thyromegaly. Lungs: Clear to auscultation bilaterally with normal respiratory effort. CV: Regular rate and rhythm, normal S1/S2, no S3/S4, no murmur. No pretibial or periankle edema.  No carotid bruit.   Abdomen: Soft, nontender, no distention.  Neurologic: Alert and oriented.  Psych: Normal affect. Skin: Normal. Musculoskeletal: No gross deformities.    ECG: Most recent ECG reviewed.   Labs: Lab Results  Component Value Date/Time   K  4.3 02/24/2016 11:31 AM   BUN 12 02/24/2016 11:31 AM   BUN 11 01/06/2016 08:13 AM   CREATININE 0.95 02/24/2016 11:31 AM   ALT 33 01/06/2016 08:13 AM   TSH 0.99 10/08/2013 12:30 PM   HGB 16.1 11/28/2016 11:27 AM   HGB 14.6 05/23/2015 11:09 PM   HGB 16.1 12/14/2014 08:55 AM     Lipids: Lab Results  Component Value Date/Time   LDLCALC 63 01/06/2016 08:13 AM   CHOL 128 01/06/2016 08:13 AM   TRIG 103 01/06/2016 08:13 AM   HDL 44 01/06/2016 08:13 AM       ASSESSMENT AND PLAN:  1. CAD s/p PCI of RCA: Symptomatically stable.  Continue aspirin, beta blocker, and simvastatin.  2. MalignantHTN:Elevated. I will increase amlodipine to 5 daily. Already takes losartan 100 mg and nadolol 80 mg.  3. Mobitz II AV block (symptomatic) s/p CRT-P revision at [facility]: Normal pacemaker function. Follows with EP.   4.  Chronic shortness of breath: This is due to left hemidiaphragm paralysis.  He has no evidence of COPD or interstitial lung disease.    Disposition: Follow up X months   Kate Sable, M.D., F.A.C.C.

## 2017-02-20 ENCOUNTER — Encounter: Payer: Self-pay | Admitting: Family Medicine

## 2017-02-20 ENCOUNTER — Telehealth: Payer: Self-pay | Admitting: Family Medicine

## 2017-02-20 ENCOUNTER — Ambulatory Visit (HOSPITAL_COMMUNITY)
Admission: RE | Admit: 2017-02-20 | Discharge: 2017-02-20 | Disposition: A | Discharge status: Home / Self Care | Payer: BLUE CROSS/BLUE SHIELD | Source: Ambulatory Visit | Attending: Family Medicine | Admitting: Family Medicine

## 2017-02-20 ENCOUNTER — Other Ambulatory Visit: Payer: Self-pay | Admitting: Family Medicine

## 2017-02-20 ENCOUNTER — Ambulatory Visit: Payer: BLUE CROSS/BLUE SHIELD | Admitting: Family Medicine

## 2017-02-20 VITALS — BP 136/84 | Ht 72.0 in | Wt 219.8 lb

## 2017-02-20 DIAGNOSIS — M5033 Other cervical disc degeneration, cervicothoracic region: Secondary | ICD-10-CM | POA: Diagnosis not present

## 2017-02-20 DIAGNOSIS — M4802 Spinal stenosis, cervical region: Secondary | ICD-10-CM | POA: Diagnosis not present

## 2017-02-20 DIAGNOSIS — Z79899 Other long term (current) drug therapy: Secondary | ICD-10-CM

## 2017-02-20 DIAGNOSIS — M2578 Osteophyte, vertebrae: Secondary | ICD-10-CM | POA: Diagnosis not present

## 2017-02-20 DIAGNOSIS — M545 Low back pain, unspecified: Secondary | ICD-10-CM

## 2017-02-20 DIAGNOSIS — M25552 Pain in left hip: Secondary | ICD-10-CM | POA: Insufficient documentation

## 2017-02-20 DIAGNOSIS — Z125 Encounter for screening for malignant neoplasm of prostate: Secondary | ICD-10-CM | POA: Diagnosis not present

## 2017-02-20 DIAGNOSIS — R739 Hyperglycemia, unspecified: Secondary | ICD-10-CM | POA: Diagnosis not present

## 2017-02-20 DIAGNOSIS — M542 Cervicalgia: Secondary | ICD-10-CM | POA: Diagnosis not present

## 2017-02-20 DIAGNOSIS — E785 Hyperlipidemia, unspecified: Secondary | ICD-10-CM | POA: Diagnosis not present

## 2017-02-20 MED ORDER — GABAPENTIN 100 MG PO CAPS: ORAL_CAPSULE | ORAL | 5 refills | Status: DC

## 2017-02-20 MED ORDER — CYCLOBENZAPRINE HCL 5 MG PO TABS: ORAL_TABLET | ORAL | 5 refills | Status: DC

## 2017-02-20 NOTE — Telephone Encounter (Signed)
Please let the patient know that I did look at his x-rays and results.  We will set up x-ray was normal.  Neck x-ray shows the fusion is stable.  He does also have some significant degenerative disc disease within the neck more than likely causing the impingement of the nerve causing his tingling in the left shoulder in addition to that she has multiple levels of degenerative disc disease in his lumbar spine which is causing the sciatica into the left leg.  Also on the lumbar spine does show some calcifications of the aorta so therefore it is important for him to do his lab work in the near future so we can look at his cholesterol.  I would recommend the patient do the stretching exercises along with the gabapentin and anti-inflammatories over the course of the next several weeks and if things are not turning the corner within the next 4-6 weeks I think it is wise for him to follow-up.  Also the patient can give [place] an update in a couple weeks time during the my chart activity on how his sciatica in his left leg is doing.

## 2017-02-20 NOTE — Progress Notes (Signed)
   Subjective:    Patient ID: [name], male    DOB: 1955/07/22, 61 y.o.   MRN: [id number]  HPI  Patient arrives with c/o left sided neck pain and hip pain for about 2 months.  Hip and back pain with sciatica Burning pain Worse with sitting and movement Tried advil Tried CBD Doing stretches without help No injury No loss of control-he does state he has not lost any bowel or bladder control He relates significant low back pain hip pain and leg pain that radiates down to his foot  6 months history off and on but now persistant past 6 weeks  Neck pain for months he has had previous surgery.  Relates pain in the neck some radiates into the left shoulder region not down the arm some tingling around the shoulder  25 minutes was spent with the patient. Greater than half the time was spent in discussion and answering questions and counseling regarding the issues that the patient came in for today.  Tingling into the shoulder No weakness No clumsiness Review of Systems  Constitutional: Negative for activity change.  Gastrointestinal: Negative for abdominal pain and vomiting.  Neurological: Negative for weakness.  Psychiatric/Behavioral: Negative for confusion.       Objective:   Physical Exam  Constitutional: He appears well-nourished.  Cardiovascular: Normal rate, regular rhythm and normal heart sounds.  No murmur heard. Pulmonary/Chest: Effort normal and breath sounds normal.  Musculoskeletal: He exhibits no edema.  Lymphadenopathy:    He has no cervical adenopathy.  Neurological: He is alert.  Psychiatric: His behavior is normal.  Vitals reviewed.   25 minutes was spent with the patient. Greater than half the time was spent in discussion and answering questions and counseling regarding the issues that the patient came in for today.       Assessment & Plan:  Left hip pain x-ray ordered more than likely this referred pain from his back  Lumbar pain with  impingement and sciatica may use anti-inflammatory stretching exercises shown physical therapy is offered but patient will defer at this point lumbar spine x-rays because the pain does wake him up at night and because of his age is greater than 60  Patient not eligible for MRI but may need CT scan has a pacemaker this can be deferred currently because of no muscle loss or weakness follow-up in 6-8 weeks may need referral to back specialist if ongoing  Neck pain discomfort chronic neck pain but does have cervical root impingement x-rays ordered  Lab work for wellness exam ordered

## 2017-02-21 NOTE — Telephone Encounter (Signed)
Spoke with patient and informed him per Dr.Scott Luking- his x-rays and results.  We will set up x-ray was normal.  Neck x-ray shows the fusion is stable.  He does also have some significant degenerative disc disease within the neck more than likely causing the impingement of the nerve causing his tingling in the left shoulder in addition to that she has multiple levels of degenerative disc disease in his lumbar spine which is causing the sciatica into the left leg.  Also on the lumbar spine does show some calcifications of the aorta so therefore it is important for him to do his lab work in the near future so we can look at his cholesterol.  I would recommend the patient do the stretching exercises along with the gabapentin and anti-inflammatories over the course of the next several weeks and if things are not turning the corner within the next 4-6 weeks I think it is wise for him to follow-up.  Also the patient can give [place] an update in a couple weeks time during the my chart activity on how his sciatica in his left leg is doing. Patient verbalized understanding.

## 2017-05-03 DIAGNOSIS — Z79899 Other long term (current) drug therapy: Secondary | ICD-10-CM | POA: Diagnosis not present

## 2017-05-03 DIAGNOSIS — R739 Hyperglycemia, unspecified: Secondary | ICD-10-CM | POA: Diagnosis not present

## 2017-05-03 DIAGNOSIS — E785 Hyperlipidemia, unspecified: Secondary | ICD-10-CM | POA: Diagnosis not present

## 2017-05-03 DIAGNOSIS — Z125 Encounter for screening for malignant neoplasm of prostate: Secondary | ICD-10-CM | POA: Diagnosis not present

## 2017-05-04 LAB — BASIC METABOLIC PANEL
BUN/Creatinine Ratio: 16 (ref 10–24)
BUN: 16 mg/dL (ref 8–27)
CO2: 27 mmol/L (ref 20–29)
Calcium: 10.1 mg/dL (ref 8.6–10.2)
Chloride: 102 mmol/L (ref 96–106)
Creatinine, Ser: 0.98 mg/dL (ref 0.76–1.27)
GFR calc Af Amer: 95 mL/min/{1.73_m2} (ref >59)
GFR calc non Af Amer: 82 mL/min/{1.73_m2} (ref >59)
Glucose: 112 mg/dL — ABNORMAL HIGH (ref 65–99)
Potassium: 4.9 mmol/L (ref 3.5–5.2)
Sodium: 145 mmol/L — ABNORMAL HIGH (ref 134–144)

## 2017-05-04 LAB — LIPID PANEL
Chol/HDL Ratio: 5.1 ratio — ABNORMAL HIGH (ref 0.0–5.0)
Cholesterol, Total: 262 mg/dL — ABNORMAL HIGH (ref 100–199)
HDL: 51 mg/dL (ref >39)
LDL Calculated: 168 mg/dL — ABNORMAL HIGH (ref 0–99)
Triglycerides: 215 mg/dL — ABNORMAL HIGH (ref 0–149)
VLDL Cholesterol Cal: 43 mg/dL — ABNORMAL HIGH (ref 5–40)

## 2017-05-04 LAB — HEPATIC FUNCTION PANEL
ALT: 23 IU/L (ref 0–44)
AST: 21 IU/L (ref 0–40)
Albumin: 4.7 g/dL (ref 3.6–4.8)
Alkaline Phosphatase: 71 IU/L (ref 39–117)
Bilirubin Total: 0.6 mg/dL (ref 0.0–1.2)
Bilirubin, Direct: 0.13 mg/dL (ref 0.00–0.40)
Total Protein: 7 g/dL (ref 6.0–8.5)

## 2017-05-04 LAB — PSA: Prostate Specific Ag, Serum: 2.2 ng/mL (ref 0.0–4.0)

## 2017-05-04 LAB — HEMOGLOBIN A1C
Est. average glucose Bld gHb Est-mCnc: 117 mg/dL
Hgb A1c MFr Bld: 5.7 % — ABNORMAL HIGH (ref 4.8–5.6)

## 2017-05-10 ENCOUNTER — Encounter: Payer: Self-pay | Admitting: Family Medicine

## 2017-05-10 ENCOUNTER — Ambulatory Visit: Payer: BLUE CROSS/BLUE SHIELD | Admitting: Family Medicine

## 2017-05-10 VITALS — BP 122/82 | Ht 72.0 in | Wt 222.8 lb

## 2017-05-10 DIAGNOSIS — E7849 Other hyperlipidemia: Secondary | ICD-10-CM

## 2017-05-10 DIAGNOSIS — Z79899 Other long term (current) drug therapy: Secondary | ICD-10-CM

## 2017-05-10 DIAGNOSIS — F439 Reaction to severe stress, unspecified: Secondary | ICD-10-CM | POA: Diagnosis not present

## 2017-05-10 DIAGNOSIS — K219 Gastro-esophageal reflux disease without esophagitis: Secondary | ICD-10-CM

## 2017-05-10 MED ORDER — ESOMEPRAZOLE MAGNESIUM 40 MG PO CPDR: 40.0000 mg | DELAYED_RELEASE_CAPSULE | Freq: Every day | ORAL | 5 refills | Status: DC

## 2017-05-10 MED ORDER — ALPRAZOLAM 0.5 MG PO TABS: 0.5000 mg | ORAL_TABLET | Freq: Three times a day (TID) | ORAL | 0 refills | Status: DC | PRN

## 2017-05-10 MED ORDER — ROSUVASTATIN CALCIUM 20 MG PO TABS: 20.0000 mg | ORAL_TABLET | Freq: Every day | ORAL | 5 refills | Status: DC

## 2017-05-10 NOTE — Progress Notes (Signed)
Subjective:    Patient ID: [name], male    DOB: 1955-11-23, 62 y.o.   MRN: [id number]  Hyperlipidemia  This is a chronic problem. Pertinent negatives include no chest pain or shortness of breath. Risk factors for coronary artery disease include dyslipidemia.   Patient arrives to discuss recent lab results. Long discussion held regarding his lab work  Patient here for follow-up regarding cholesterol.  Patient does try to maintain a reasonable diet.  Patient does take the medication on a regular basis.  Denies missing a dose.  The patient denies any obvious side effects.  Prior blood work results reviewed with the patient.  The patient is aware of his cholesterol goals and the need to keep it under good control to lessen the risk of disease.  Patient denies chest tightness pressure pain  Patient does have ongoing trouble with reflux.  Takes medication on a regular basis.  Tries to minimize foods as best they can.  They understand the importance of dietary compliance.  May also try to avoid eating a large meal close to bedtime.  Patient denies any dysphagia denies hematochezia.  States medicine does a good job keeping the problem under good control.  Without the medication may certainly have issues.They desire to continue taking their medication. Symptoms are not always under good control with current medicine he would like to try changing to Nexium  Review of Systems  Constitutional: Negative for activity change, appetite change and fatigue.  HENT: Negative for congestion and rhinorrhea.   Respiratory: Negative for cough, chest tightness and shortness of breath.   Cardiovascular: Negative for chest pain and leg swelling.  Gastrointestinal: Negative for abdominal pain, diarrhea and nausea.  Endocrine: Negative for polydipsia and polyphagia.  Genitourinary: Negative for dysuria and hematuria.  Neurological: Negative for weakness and headaches.  Psychiatric/Behavioral: Negative for  confusion and dysphoric mood.       Objective:   Physical Exam  Constitutional: He appears well-nourished. No distress.  HENT:  Head: Normocephalic and atraumatic.  Eyes: Right eye exhibits no discharge. Left eye exhibits no discharge.  Neck: No tracheal deviation present.  Cardiovascular: Normal rate, regular rhythm and normal heart sounds.  No murmur heard. Pulmonary/Chest: Effort normal and breath sounds normal. No respiratory distress. He has no wheezes.  Musculoskeletal: He exhibits no edema.  Lymphadenopathy:    He has no cervical adenopathy.  Neurological: He is alert.  Skin: Skin is warm. No rash noted.  Psychiatric: His behavior is normal.  Vitals reviewed.  GAD 7 : Generalized Anxiety Score 05/10/2017  Nervous, Anxious, on Edge 1  Control/stop worrying 1  Worry too much - different things 1  Trouble relaxing 0  Restless 0  Easily annoyed or irritable 0  Afraid - awful might happen 0  Total GAD 7 Score 3  Anxiety Difficulty Not difficult at all    Depression screen [facility] 2/9 05/10/2017 01/18/2015  Decreased Interest 0 0  Down, Depressed, Hopeless 0 0  PHQ - 2 Score 0 0  Altered sleeping 0 -  Tired, decreased energy 1 -  Change in appetite 0 -  Feeling bad or failure about yourself  0 -  Trouble concentrating 2 -  Moving slowly or fidgety/restless 0 -  Suicidal thoughts 0 -  PHQ-9 Score 3 -  Difficult doing work/chores Not difficult at all -    25 minutes was spent with the patient. Greater than half the time was spent in discussion and answering questions and counseling regarding  the issues that the patient came in for today.       Assessment & Plan:  The patient was seen today as part of an evaluation regarding hyperlipidemia. Recent lab work has been reviewed with the patient as well as the goals for good cholesterol care. In addition to this medications have been discussed the importance of compliance with diet and medications discussed as well. Patient  has been informed of potential side effects of medications in the importance to notify us should any problems occur. Finally the patient is aware that poor control of cholesterol, noncompliance can dramatically increase her risk of heart attack strokes and premature death. The patient will keep regular office visits and the patient does agreed to periodic lab work. Poor lipid control patient will do a better job with diet he was using keto diet he will now more toward vegetarian style diet Mediterranean. Patient agrees to change his cholesterol medicine.  Recommend crest for 20 he will notify us if any side effects He will do lab work in 3 months for follow-up office visit  Anxiety related issues Xanax for occasional use does not need daily medicine  Patient not depressed  GERD related symptoms with dyspepsia change Nexium GI symptoms not dramatically better over the next few weeks he is to notify us we will set him up with gastroenterology

## 2017-06-13 ENCOUNTER — Ambulatory Visit: Payer: BLUE CROSS/BLUE SHIELD | Admitting: Family Medicine

## 2017-06-13 VITALS — BP 132/80 | Temp 98.1°F | Ht 72.0 in | Wt 224.0 lb

## 2017-06-13 DIAGNOSIS — R319 Hematuria, unspecified: Secondary | ICD-10-CM | POA: Diagnosis not present

## 2017-06-13 LAB — POCT URINALYSIS DIPSTICK
Spec Grav, UA: 1.015 (ref 1.010–1.025)
pH, UA: 5 (ref 5.0–8.0)

## 2017-06-13 MED ORDER — CIPROFLOXACIN HCL 500 MG PO TABS: 500.0000 mg | ORAL_TABLET | Freq: Two times a day (BID) | ORAL | 0 refills | Status: DC

## 2017-06-13 NOTE — Progress Notes (Signed)
   Subjective:    Patient ID: [name], male    DOB: Aug 24, 1955, 62 y.o.   MRN: [id number]  Hematuria  This is a new problem. Episode onset: 5 days. Pertinent negatives include no abdominal pain, dysuria, fever or nausea. (Pain after urination, blood in urine happened twice)  Patient states 2 separate times he urinated had some burning afterwards denied flank pain denies pelvic pain he did state he saw some blood once in the commode once when he P there is a very small amount denies high fever chills sweats Results for orders placed or performed in visit on 06/13/17  POCT urinalysis dipstick  Result Value Ref Range   Color, UA     Clarity, UA     Glucose, UA norm    Bilirubin, UA     Ketones, UA     Spec Grav, UA 1.015 1.010 - 1.025   Blood, UA     pH, UA 5.0 5.0 - 8.0   Protein, UA     Urobilinogen, UA  0.2 or 1.0 E.U./dL   Nitrite, UA     Leukocytes, UA  Negative   Appearance     Odor        Review of Systems  Constitutional: Negative for activity change, fatigue and fever.  HENT: Negative for congestion and rhinorrhea.   Respiratory: Negative for cough and shortness of breath.   Cardiovascular: Negative for chest pain and leg swelling.  Gastrointestinal: Negative for abdominal pain, diarrhea and nausea.  Genitourinary: Positive for hematuria. Negative for dysuria.  Neurological: Negative for weakness and headaches.  Psychiatric/Behavioral: Negative for behavioral problems.       Objective:   Physical Exam  Constitutional: He appears well-nourished. No distress.  HENT:  Head: Normocephalic and atraumatic.  Eyes: Right eye exhibits no discharge. Left eye exhibits no discharge.  Neck: No tracheal deviation present.  Cardiovascular: Normal rate, regular rhythm and normal heart sounds.  No murmur heard. Pulmonary/Chest: Effort normal and breath sounds normal. No respiratory distress. He has no wheezes.  Musculoskeletal: He exhibits no edema.  Lymphadenopathy:      He has no cervical adenopathy.  Neurological: He is alert.  Skin: Skin is warm. No rash noted.  Psychiatric: His behavior is normal.  Vitals reviewed.  No flank pain on percussion abdomen soft   Urinalysis under the microscope did not show any red blood cells    Assessment & Plan:  Will go ahead and culture her urine await the results Antibiotics prescribed I believe patient will benefit from urology consultation with cystoscope he would like to hold off on this currently Importance of doing cystoscope explained to the patient.  Including to rule out cancer

## 2017-06-14 ENCOUNTER — Other Ambulatory Visit: Payer: Self-pay | Admitting: Family Medicine

## 2017-06-16 LAB — URINE CULTURE

## 2017-06-16 LAB — SPECIMEN STATUS REPORT

## 2017-06-20 ENCOUNTER — Other Ambulatory Visit: Payer: Self-pay | Admitting: Family Medicine

## 2017-06-20 DIAGNOSIS — R319 Hematuria, unspecified: Secondary | ICD-10-CM

## 2017-06-21 NOTE — Progress Notes (Signed)
Electrophysiology Office Note Date: 06/22/2017  ID:  [name], [name] 1956/01/12, MRN [id number]  PCP: Kathyrn Drown, MD Primary Cardiologist: Bronson Ing Electrophysiologist: Allred  CC: Pacemaker follow-up  [name] is a 62 y.o. male seen today for Dr Rayann Heman.  He presents today for routine electrophysiology followup.  Since last being seen in our clinic, the patient reports doing relatively well.  He has chronic atypical chest pain that is unchanged from previous.  His shortness of breath is stable. He was evaluated by pulmonary and cause was felt to be left hemidiaphragm paralysis.  He is struggling with hematuria and is scheduled to see urology soon. He denies palpitations,  PND, orthopnea, nausea, vomiting, dizziness, syncope, edema, or early satiety.   Past Medical History:  Diagnosis Date  . ADD (attention deficit disorder)    Rx-Adderall  . Anginal pain ([place])   . AV block, 3rd degree ([facility])   . Colon polyps 2011   tubular adenoma by excisional biopsy during colonoscopy  . Complete heart block ([place])   . Coronary artery disease   . Degenerative disc disease, cervical   . GERD (gastroesophageal reflux disease)   . Hypertension   . Pacemaker   . Palpitations 2003   Holter in 2003: PACs and PVCs; negative stress nuclear test in 2005; right bundle branch block; echo in 2008-mild LVH, otherwise normal; 2008-negative stress nuclear test.  . Pneumonia   . Prostatitis    prostate calcifications by CT  . Sleep apnea    questionable diagnosis   Past Surgical History:  Procedure Laterality Date  . ANTERIOR CERVICAL DECOMP/DISCECTOMY FUSION    . Biventricular pacemaker upgrade/ system revision  04/02/14   removal of previous atrial and ventricular leads with placement of a new MDT Consulta CRT-P system at [facility] by Dr Violet Baldy  . COLONOSCOPY    . COLONOSCOPY W/ POLYPECTOMY  2011   tubular adenoma  . ESOPHAGOGASTRODUODENOSCOPY     Gastritis  .  KNEE SURGERY     rt  . LEFT HEART CATHETERIZATION WITH CORONARY ANGIOGRAM N/A 10/17/2012   Procedure: LEFT HEART CATHETERIZATION WITH CORONARY ANGIOGRAM;  Surgeon: Josue Hector, MD;  Location: [facility] CATH LAB;  Service: Cardiovascular;  Laterality: N/A;  . LEFT HEART CATHETERIZATION WITH CORONARY ANGIOGRAM N/A 12/17/2012   Procedure: LEFT HEART CATHETERIZATION WITH CORONARY ANGIOGRAM;  Surgeon: Peter M Martinique, MD;  Location: [facility] CATH LAB;  Service: Cardiovascular;  Laterality: N/A;  . LEFT HEART CATHETERIZATION WITH CORONARY ANGIOGRAM N/A 06/11/2013   Procedure: LEFT HEART CATHETERIZATION WITH CORONARY ANGIOGRAM;  Surgeon: Wellington Hampshire, MD;  Location: [place] CATH LAB;  Service: Cardiovascular;  Laterality: N/A;  . PACEMAKER INSERTION  12/17/12   MDT Adapta L implanted by Dr Rayann Heman for mobitz II second degree AV block  . PERCUTANEOUS CORONARY STENT INTERVENTION (PCI-S)  10/17/2012   Procedure: PERCUTANEOUS CORONARY STENT INTERVENTION (PCI-S);  Surgeon: Josue Hector, MD;  Location: [facility] CATH LAB;  Service: Cardiovascular;;  . PERMANENT PACEMAKER INSERTION N/A 12/17/2012   Procedure: PERMANENT PACEMAKER INSERTION;  Surgeon: Thompson Grayer, MD;  Location[facility] C[facility] LAB;  Service: Cardiovascular;  Laterality: N/A;  . POLYPECTOMY    . UPPER GASTROINTESTINAL ENDOSCOPY      Current Outpatient Medications  Medication Sig Dispense Refill  . ALPRAZolam (XANAX) 0.5 MG tablet Take 1 tablet (0.5 mg total) by mouth 3 (three) times daily as needed for sleep or anxiety. 30 tablet 0  . amLODipine (NORVASC) 5 MG tablet Take 5 mg  by mouth daily.    Marland Kitchen aspirin 81 MG chewable tablet Chew 81 mg by mouth daily.    . ciprofloxacin (CIPRO) 500 MG tablet Take 1 tablet (500 mg total) by mouth 2 (two) times daily. 20 tablet 0  . cyclobenzaprine (FLEXERIL) 5 MG tablet Take 5 mg by mouth at bedtime as needed for muscle spasms.    Marland Kitchen esomeprazole (NEXIUM) 40 MG capsule Take 1 capsule (40 mg total) by mouth daily. 30 capsule 5  . gabapentin  (NEURONTIN) 100 MG capsule Take 100-200 mg by mouth 2 (two) times daily. 100mg  in the morning and 200mg  in the evening    . hydrALAZINE (APRESOLINE) 50 MG tablet Take 50 mg by mouth 2 (two) times daily.    Marland Kitchen losartan (COZAAR) 100 MG tablet take 1 tablet by mouth once daily 90 tablet 3  . nadolol (CORGARD) 80 MG tablet Take 1 tablet (80 mg total) by mouth daily. 90 tablet 3  . nitroGLYCERIN (NITROSTAT) 0.4 MG SL tablet Place 1 tablet (0.4 mg total) under the tongue every 5 (five) minutes as needed for chest pain (MAX 3 TABLETS). 25 tablet 3  . rosuvastatin (CRESTOR) 20 MG tablet Take 1 tablet (20 mg total) by mouth daily. 30 tablet 5  . tamsulosin (FLOMAX) 0.4 MG CAPS capsule Take 2 capsules (0.8 mg total) by mouth daily. 60 capsule 11   Current Facility-Administered Medications  Medication Dose Route Frequency Provider Last Rate Last Dose  . 0.9 %  sodium chloride infusion  500 mL Intravenous Continuous Ladene Artist, MD        Allergies:   Morphine and related; Lasix [furosemide]; and Latex   Social History: Social History   Socioeconomic History  . Marital status: Married    Spouse name: Not on file  . Number of children: 3  . Years of education: Not on file  . Highest education level: Not on file  Social Needs  . Financial resource strain: Not on file  . Food insecurity - worry: Not on file  . Food insecurity - inability: Not on file  . Transportation needs - medical: Not on file  . Transportation needs - non-medical: Not on file  Occupational History  . Not on file  Tobacco Use  . Smoking status: Former Smoker    Packs/day: 2.50    Years: 40.00    Pack years: 100.00    Types: Cigarettes    Start date: 04/18/1967    Last attempt to quit: 12/20/2003    Years since quitting: 13.5  . Smokeless tobacco: Former Network engineer and Sexual Activity  . Alcohol use: Yes    Alcohol/week: 0.6 oz    Types: 1 Shots of liquor per week    Comment: occasional  . Drug use: Yes     Types: Marijuana    Comment: 30 + years ago - "crank, marjiuanna, ect."  . Sexual activity: Yes    Birth control/protection: Surgical  Other Topics Concern  . Not on file  Social History Narrative  . Not on file    Family History: Family History  Problem Relation Age of Onset  . Heart disease Mother   . Hypertension Mother        Carotid disease  . Prostate cancer Brother   . Breast cancer Maternal Aunt   . Colon cancer Neg Hx   . Colon polyps Neg Hx      Review of Systems: All other systems reviewed and are otherwise negative except as  noted above.   Physical Exam: VS:  BP 126/74   Pulse 60   Ht 6' (1.829 m)   Wt 227 lb 9.6 oz (103.2 kg)   BMI 30.87 kg/m  , BMI Body mass index is 30.87 kg/m.  GEN- The patient is well appearing, alert and oriented x 3 today.   HEENT: normocephalic, atraumatic; sclera clear, conjunctiva pink; hearing intact; oropharynx clear; neck supple  Lungs- Clear to ausculation bilaterally, normal work of breathing.  No wheezes, rales, rhonchi Heart- Regular rate and rhythm (paced) GI- soft, non-tender, non-distended, bowel sounds present  Extremities- no clubbing, cyanosis, or edema  MS- no significant deformity or atrophy Skin- warm and dry, no rash or lesion; PPM pocket well healed Psych- euthymic mood, full affect Neuro- strength and sensation are intact  PPM Interrogation- reviewed in detail today,  See PACEART report  EKG:  EKG is not ordered today.  Recent Labs: 11/28/2016: Hemoglobin 16.1 05/03/2017: ALT 23; BUN 16; Creatinine, Ser 0.98; Potassium 4.9; Sodium 145   Wt Readings from Last 3 Encounters:  06/22/17 227 lb 9.6 oz (103.2 kg)  06/13/17 224 lb (101.6 kg)  05/10/17 222 lb 12.8 oz (101.1 kg)     Other studies Reviewed: Additional studies/ records that were reviewed today include: Dr Jackalyn Lombard office notes  Assessment and Plan:  1.  Mobitz II Normal PPM function See Pace Art report No changes today Pt dependent today   New Carelink monitor paired today and correct serial number entered in Carelink  2.  HTN Stable No change required today  3.  CAD Stable No change required today  4.  Shortness of breath Stable Felt to be 2/2 left hemidiaphragm paralysis   Current medicines are reviewed at length with the patient today.   The patient does not have concerns regarding his medicines.     Labs/ tests ordered today include: none Orders Placed This Encounter  Procedures  . CUP PACEART INCLINIC DEVICE CHECK     Disposition:   Follow up with [facility], Dr Rayann Heman 1 year    Signed, Chanetta Marshall, NP 06/22/2017 11:09 AM  [facility] [address] [place] [address] [phone] (office) [phone] (fax)

## 2017-06-22 ENCOUNTER — Encounter: Payer: Self-pay | Admitting: Nurse Practitioner

## 2017-06-22 ENCOUNTER — Ambulatory Visit (INDEPENDENT_AMBULATORY_CARE_PROVIDER_SITE_OTHER): Payer: BLUE CROSS/BLUE SHIELD | Admitting: Nurse Practitioner

## 2017-06-22 VITALS — BP 126/74 | HR 60 | Ht 72.0 in | Wt 227.6 lb

## 2017-06-22 DIAGNOSIS — R0602 Shortness of breath: Secondary | ICD-10-CM

## 2017-06-22 DIAGNOSIS — I10 Essential (primary) hypertension: Secondary | ICD-10-CM | POA: Diagnosis not present

## 2017-06-22 DIAGNOSIS — I25708 Atherosclerosis of coronary artery bypass graft(s), unspecified, with other forms of angina pectoris: Secondary | ICD-10-CM | POA: Diagnosis not present

## 2017-06-22 DIAGNOSIS — I441 Atrioventricular block, second degree: Secondary | ICD-10-CM

## 2017-06-22 LAB — CUP PACEART INCLINIC DEVICE CHECK
Date Time Interrogation Session: 20190308104659
Implantable Lead Implant Date: 20151217
Implantable Lead Implant Date: 20151217
Implantable Lead Implant Date: 20151217
Implantable Lead Location: 753858
Implantable Lead Location: 753859
Implantable Lead Location: 753860
Implantable Lead Model: 4196
Implantable Lead Model: 5076
Implantable Lead Model: 5076
Implantable Pulse Generator Implant Date: 20151217

## 2017-06-22 NOTE — Patient Instructions (Addendum)
Medication Instructions:   Your physician recommends that you continue on your current medications as directed. Please refer to the Current Medication list given to you today.  If you need a refill on your cardiac medications before your next appointment, please call your pharmacy.  Labwork: NONE ORDERED  TODAY    Testing/Procedures:  NONE ORDERED  TODAY    Follow-Up:  Your physician wants you to follow-up in: [place] will receive a reminder letter in the mail two months in advance. If you don't receive a letter, please call our office to schedule the follow-up appointment.   Remote monitoring is used to monitor your Pacemaker of ICD from home. This monitoring reduces the number of office visits required to check your device to one time per year. It allows [place] to keep an eye on the functioning of your device to ensure it is working properly. You are scheduled for a device check from home on . 6-10-19You may send your transmission at any time that day. If you have a wireless device, the transmission will be sent automatically. After your physician reviews your transmission, you will receive a postcard with your next transmission date.     Any Other Special Instructions Will Be Listed Below (If Applicable).

## 2017-06-24 ENCOUNTER — Telehealth: Payer: Self-pay | Admitting: Family Medicine

## 2017-06-24 NOTE — Telephone Encounter (Signed)
Nurses-please let the patient know-or his wife-that I did look at the urine they dropped off it did have red blood cells in it.  It is recommended to go ahead with further workup.  The patient has been referred to urology they will review over his information and call him with appointment.  If they have not heard anything from urology within the next 2 weeks please let us know.  Also the patient needs CT scan as part of the workup.  This is to exclude potential lesions in the kidney.  I recommend abdominal pelvic CT scan with and without contrast/ urography.  Thanks

## 2017-06-25 ENCOUNTER — Telehealth: Payer: Self-pay | Admitting: Family Medicine

## 2017-06-25 ENCOUNTER — Other Ambulatory Visit: Payer: Self-pay | Admitting: Family Medicine

## 2017-06-25 DIAGNOSIS — R319 Hematuria, unspecified: Secondary | ICD-10-CM

## 2017-06-25 NOTE — Telephone Encounter (Signed)
Called to give appt info for CT scan (scheduled 07/03/17 @ 3:45pm)  Pt states no blood in urine for 4 to 5 days  Question - move recheck appt?  Currently scheduled for 07/02/17  Please advise

## 2017-06-25 NOTE — Telephone Encounter (Signed)
Pt called back and actually did have blood this morning

## 2017-06-25 NOTE — Telephone Encounter (Signed)
Spoke with patient and he states that this morning was the first morning in 5 days that he has blood in urine. No lower pain, but is having some burning with urination. Please advise. Thanks

## 2017-06-25 NOTE — Telephone Encounter (Signed)
Spoke with patient and he verbalized understanding. Contacted wife to see what time pt is available, wife stated anytime was fine. CT order put in Epic.

## 2017-06-25 NOTE — Telephone Encounter (Signed)
Please allow patient to come by office to submit urine for urine culture-I recommend doing urine culture if results show bacteria patient will need to be on another course of antibiotic

## 2017-06-25 NOTE — Telephone Encounter (Signed)
Left message to return call on home answering machine due to mobile number voicemail being full.

## 2017-06-27 NOTE — Telephone Encounter (Signed)
Patient has follow up on Monday for blood in urine; has CT scan scheduled for Tuesday. Pt is wanting to know if we move the follow up visit to sometime after Tuesday. Please advise. Thanks

## 2017-06-27 NOTE — Telephone Encounter (Signed)
Front -please do follow up for next week on Weds or Thursday (after Tuesday) please do so and notify patient

## 2017-06-28 NOTE — Telephone Encounter (Signed)
Spoke with patients spouse, appointment changed to 07/05/17.

## 2017-07-02 ENCOUNTER — Ambulatory Visit: Payer: BLUE CROSS/BLUE SHIELD | Admitting: Family Medicine

## 2017-07-03 ENCOUNTER — Ambulatory Visit (HOSPITAL_COMMUNITY)
Admission: RE | Admit: 2017-07-03 | Discharge: 2017-07-03 | Disposition: A | Discharge status: Home / Self Care | Payer: BLUE CROSS/BLUE SHIELD | Source: Ambulatory Visit | Attending: Family Medicine | Admitting: Family Medicine

## 2017-07-03 DIAGNOSIS — R310 Gross hematuria: Secondary | ICD-10-CM | POA: Diagnosis not present

## 2017-07-03 DIAGNOSIS — R319 Hematuria, unspecified: Secondary | ICD-10-CM

## 2017-07-03 LAB — POCT I-STAT CREATININE: Creatinine, Ser: 1 mg/dL (ref 0.61–1.24)

## 2017-07-03 MED ORDER — IOPAMIDOL (ISOVUE-300) INJECTION 61%
150.0000 mL | Freq: Once | INTRAVENOUS | Status: AC | PRN
  Administered 2017-07-03: 125 mL via INTRAVENOUS

## 2017-07-04 ENCOUNTER — Encounter: Payer: Self-pay | Admitting: Family Medicine

## 2017-07-05 ENCOUNTER — Encounter: Payer: Self-pay | Admitting: Family Medicine

## 2017-07-05 ENCOUNTER — Ambulatory Visit: Payer: BLUE CROSS/BLUE SHIELD | Admitting: Family Medicine

## 2017-07-05 VITALS — BP 138/90 | Temp 98.2°F | Ht 72.0 in | Wt 227.4 lb

## 2017-07-05 DIAGNOSIS — R319 Hematuria, unspecified: Secondary | ICD-10-CM

## 2017-07-05 NOTE — Progress Notes (Signed)
   Subjective:    Patient ID: [name], male    DOB: July 09, 1955, 62 y.o.   MRN: [id number]  Hematuria  The current episode started more than 1 month ago. Associated symptoms include abdominal pain.   Pt stated he passed what looked a blood clot about a week ago; states that the blood is intermittent. Patient has been having intermittent urinary bleeding over the past several weeks denies flank pain but does relate occasional dysuria he does state he has had one time where he thought he might have passed something that looked like a small black speck but he does not think it was a kidney stone patient had recent CT scan he comes in today to discuss this  Review of Systems  Gastrointestinal: Positive for abdominal pain.  Genitourinary: Positive for hematuria.       Objective:   Physical Exam  15 minutes was spent with patient today discussing healthcare issues which they came. Greater than half of the discussion was answering questions and giving management guidance regarding the diagnosis for which the patient came.  Please see diagnosis discuss   Time was spent today discussing his CAT scan.  Patient does have aortic atherosclerosis he does try to keep his cholesterol under control Did not show any renal growth does show thickened bladder Referral to urology has already been made hopefully they will see him soon      Assessment & Plan:  Hematuria-needs cystoscopy, patient was counseled why he needs this and the importance of following through The patient states that he does not think he can tolerate doing well without being sedated we will pass this along to urology

## 2017-07-06 ENCOUNTER — Telehealth: Payer: Self-pay | Admitting: Family Medicine

## 2017-07-06 NOTE — Telephone Encounter (Signed)
Attempted to contact pt on mobile; voicemail full. Called home phone and left message on answering machine

## 2017-07-06 NOTE — Telephone Encounter (Signed)
Please let the patient know-or his wife- that urology stated that on the first visit it will be a consultation and they will set up the cystoscope.  Typically if a person has anxiety about this procedure they recommended a value before the visit for cystoscope.  If there severe is severe then they will do it as an outpatient through the surgical center.  They stated they can discuss this with him when he comes in for his initial consultation. (Patient desired to get more information before he went to urology-this information should help)

## 2017-07-09 NOTE — Telephone Encounter (Signed)
Patient is aware of all and referral was placed on 06/23/2017.

## 2017-07-10 ENCOUNTER — Other Ambulatory Visit: Payer: Self-pay | Admitting: Family Medicine

## 2017-07-10 NOTE — Telephone Encounter (Signed)
The best course of action is to verify with the patient which dosing he is currently taking then please make sure epic medication list reflects this as well as he may have refills if needed notify pharmacy of which dose he is no longer taking thank you

## 2017-07-11 NOTE — Telephone Encounter (Signed)
vm full on the 552 # I called the home # [phone] left a vm to r/c.

## 2017-07-11 NOTE — Telephone Encounter (Signed)
I called and mailbox full.

## 2017-07-12 MED ORDER — AMLODIPINE BESYLATE 5 MG PO TABS: 5.0000 mg | ORAL_TABLET | Freq: Every day | ORAL | 5 refills | Status: DC

## 2017-08-01 ENCOUNTER — Encounter: Payer: Self-pay | Admitting: Family Medicine

## 2017-08-01 NOTE — Telephone Encounter (Signed)
Nurses-please connect with the patient.  According to the lab section of the chart lipid and liver has already been ordered in the orders are still active.  He could do these.

## 2017-08-01 NOTE — Telephone Encounter (Signed)
Nurses please see previous message-please relay this to the patient

## 2017-08-06 DIAGNOSIS — E7849 Other hyperlipidemia: Secondary | ICD-10-CM | POA: Diagnosis not present

## 2017-08-06 DIAGNOSIS — Z79899 Other long term (current) drug therapy: Secondary | ICD-10-CM | POA: Diagnosis not present

## 2017-08-07 ENCOUNTER — Ambulatory Visit: Payer: BLUE CROSS/BLUE SHIELD | Admitting: Urology

## 2017-08-07 DIAGNOSIS — R310 Gross hematuria: Secondary | ICD-10-CM | POA: Diagnosis not present

## 2017-08-07 DIAGNOSIS — N5201 Erectile dysfunction due to arterial insufficiency: Secondary | ICD-10-CM | POA: Diagnosis not present

## 2017-08-07 LAB — LIPID PANEL
Chol/HDL Ratio: 2.2 ratio (ref 0.0–5.0)
Cholesterol, Total: 127 mg/dL (ref 100–199)
HDL: 58 mg/dL (ref >39)
LDL Calculated: 55 mg/dL (ref 0–99)
Triglycerides: 68 mg/dL (ref 0–149)
VLDL Cholesterol Cal: 14 mg/dL (ref 5–40)

## 2017-08-07 LAB — HEPATIC FUNCTION PANEL
ALT: 20 IU/L (ref 0–44)
AST: 17 IU/L (ref 0–40)
Albumin: 4.5 g/dL (ref 3.6–4.8)
Alkaline Phosphatase: 64 IU/L (ref 39–117)
Bilirubin Total: 0.7 mg/dL (ref 0.0–1.2)
Bilirubin, Direct: 0.22 mg/dL (ref 0.00–0.40)
Total Protein: 6.4 g/dL (ref 6.0–8.5)

## 2017-08-08 ENCOUNTER — Ambulatory Visit: Payer: BLUE CROSS/BLUE SHIELD | Admitting: Family Medicine

## 2017-08-16 ENCOUNTER — Encounter: Payer: Self-pay | Admitting: Family Medicine

## 2017-08-16 ENCOUNTER — Ambulatory Visit: Payer: BLUE CROSS/BLUE SHIELD | Admitting: Family Medicine

## 2017-08-16 ENCOUNTER — Telehealth: Payer: Self-pay | Admitting: Family Medicine

## 2017-08-16 VITALS — BP 122/70 | Ht 72.0 in | Wt 227.8 lb

## 2017-08-16 DIAGNOSIS — M5432 Sciatica, left side: Secondary | ICD-10-CM

## 2017-08-16 DIAGNOSIS — I10 Essential (primary) hypertension: Secondary | ICD-10-CM

## 2017-08-16 MED ORDER — LOSARTAN POTASSIUM 100 MG PO TABS: 100.0000 mg | ORAL_TABLET | Freq: Every day | ORAL | 3 refills | Status: DC

## 2017-08-16 MED ORDER — HYDROCODONE-ACETAMINOPHEN 10-325 MG PO TABS: 1.0000 | ORAL_TABLET | ORAL | 0 refills | Status: AC | PRN

## 2017-08-16 NOTE — Telephone Encounter (Signed)
Pt will need CT or MRI of L-spine to send with referral to Dr. Ellene Route  (Neurosurgery requires imaging to review so that they can get patients seen in a timely manner)  Please order scan so that I may send results with referral

## 2017-08-16 NOTE — Progress Notes (Signed)
   Subjective:    Patient ID: [name], male    DOB: 1955/06/18, 62 y.o.   MRN: [id number]  Hyperlipidemia  This is a chronic problem. Pertinent negatives include no chest pain or shortness of breath. There are no compliance problems.    Pt also states his lower back is hurting today and is radiating down to left leg. Been going on for months. Has tried yoga, stretches, and chiropractor. Chiropractor did not help, pt states that when chiropractor is pressing down it hurts. Significant sciatica issues seen chiropractor that really seem to help a whole lot  Patient here for follow-up regarding cholesterol.  Patient does try to maintain a reasonable diet.  Patient does take the medication on a regular basis.  Denies missing a dose.  The patient denies any obvious side effects.  Prior blood work results reviewed with the patient.  The patient is aware of his cholesterol goals and the need to keep it under good control to lessen the risk of disease.  Patient for blood pressure check up. Patient relates compliance with meds. Todays BP reviewed with the patient. Patient denies issues with medication. Patient relates reasonable diet. Patient tries to minimize salt. Patient aware of BP goals.   Review of Systems  Constitutional: Negative for activity change, appetite change and fatigue.  HENT: Negative for congestion and rhinorrhea.   Respiratory: Negative for cough, chest tightness and shortness of breath.   Cardiovascular: Negative for chest pain and leg swelling.  Gastrointestinal: Negative for abdominal pain, diarrhea and nausea.  Endocrine: Negative for polydipsia and polyphagia.  Genitourinary: Negative for dysuria and hematuria.  Neurological: Negative for weakness and headaches.  Psychiatric/Behavioral: Negative for confusion and dysphoric mood.       Objective:   Physical Exam  Constitutional: He appears well-nourished. No distress.  Cardiovascular: Normal rate, regular rhythm  and normal heart sounds.  No murmur heard. Pulmonary/Chest: Effort normal and breath sounds normal. No respiratory distress.  Musculoskeletal: He exhibits no edema.  Lymphadenopathy:    He has no cervical adenopathy.  Neurological: He is alert.  Psychiatric: His behavior is normal.  Vitals reviewed.   Significant issues regarding straight leg raise on the left side      Assessment & Plan:  Sciatica- will discuss with specialist if they could be myelogram versus CAT scan of patient recently had CAT scan of abdomen  Hyperlipidemia much improved continue current measures  Blood pressure good

## 2017-08-24 ENCOUNTER — Encounter (INDEPENDENT_AMBULATORY_CARE_PROVIDER_SITE_OTHER): Payer: Self-pay

## 2017-08-24 ENCOUNTER — Encounter: Payer: Self-pay | Admitting: Family Medicine

## 2017-08-24 NOTE — Telephone Encounter (Signed)
[facility]-please contact neurosurgery speak with 1 of their schedulers regarding the following- also if 1 of their clinical people would like to talk with me regarding this issue I am more than willing to talk.    This patient states that Dr. Ellene Route did a myelogram on his wife rather than doing an MRI.  That is not something I order.  Both he and his wife have pacemakers and cannot do MRIs of the lumbar spine.  Patient-[name]-is having severe sciatica in the right leg.  Please find out from neurosurgery office if they would be willing to see him without the CAT scan of the back and therefore Dr. Ellene Route would help set up the patient for a myelogram more than likely after they see him.  The patient recently had a CAT scan of his abdomen which showed degenerative changes in the lower spine.  The patient does not want to go through another CAT scan of the lumbar spine because of increased radiation exposure.  Also he and his wife stated that when his wife had similar problems Dr. Ellene Route did a myelogram before her surgery.

## 2017-08-24 NOTE — Telephone Encounter (Signed)
Called & [facility] for Rollene Fare (new pt coord) Explained that we are sending referral for pt to see Dr. Ellene Route & that he'll need a myelogram  Faxed [facility] notes, phone message, & CT results - they will review & call pt directly to schedule

## 2017-08-28 NOTE — Telephone Encounter (Signed)
With nurses-please let family know that we had provided Dr. Clarice Pole group with all of his information and asked that they call him to schedule up the myelogram-if he has not heard from their group within the next 5 to 7 days he should call their group asked them where they are in regards to scheduling him for his myelogram and office visit with Dr. Ellene Route.  If he is having difficulty he needs to let us know

## 2017-08-29 NOTE — Telephone Encounter (Signed)
Patient stated that he has already been contacted by Dr Clarice Pole group and has an appointment set up to come in for evaluation.

## 2017-08-29 NOTE — Telephone Encounter (Signed)
good

## 2017-09-06 DIAGNOSIS — M4726 Other spondylosis with radiculopathy, lumbar region: Secondary | ICD-10-CM | POA: Diagnosis not present

## 2017-09-21 DIAGNOSIS — M4726 Other spondylosis with radiculopathy, lumbar region: Secondary | ICD-10-CM | POA: Diagnosis not present

## 2017-09-21 DIAGNOSIS — M5417 Radiculopathy, lumbosacral region: Secondary | ICD-10-CM | POA: Diagnosis not present

## 2017-09-24 ENCOUNTER — Telehealth: Payer: Self-pay

## 2017-09-24 ENCOUNTER — Ambulatory Visit (INDEPENDENT_AMBULATORY_CARE_PROVIDER_SITE_OTHER): Payer: BLUE CROSS/BLUE SHIELD | Admitting: *Deleted

## 2017-09-24 DIAGNOSIS — I441 Atrioventricular block, second degree: Secondary | ICD-10-CM

## 2017-09-24 NOTE — Telephone Encounter (Signed)
LMOVM reminding pt to send remote transmission.   

## 2017-09-26 ENCOUNTER — Encounter: Payer: Self-pay | Admitting: Cardiology

## 2017-09-27 ENCOUNTER — Encounter: Payer: Self-pay | Admitting: Cardiology

## 2017-09-27 NOTE — Progress Notes (Signed)
Remote pacemaker transmission.   

## 2017-10-01 NOTE — Telephone Encounter (Signed)
Called pt to send a manual transmission.

## 2017-10-02 ENCOUNTER — Ambulatory Visit: Payer: BLUE CROSS/BLUE SHIELD | Admitting: Family Medicine

## 2017-10-02 ENCOUNTER — Encounter: Payer: Self-pay | Admitting: Family Medicine

## 2017-10-02 VITALS — BP 132/84 | Ht 72.0 in | Wt 223.0 lb

## 2017-10-02 DIAGNOSIS — F411 Generalized anxiety disorder: Secondary | ICD-10-CM | POA: Diagnosis not present

## 2017-10-02 DIAGNOSIS — M5432 Sciatica, left side: Secondary | ICD-10-CM | POA: Diagnosis not present

## 2017-10-02 DIAGNOSIS — M792 Neuralgia and neuritis, unspecified: Secondary | ICD-10-CM | POA: Diagnosis not present

## 2017-10-02 MED ORDER — PREGABALIN 25 MG PO CAPS: 25.0000 mg | ORAL_CAPSULE | Freq: Two times a day (BID) | ORAL | 2 refills | Status: DC

## 2017-10-02 MED ORDER — OXYCODONE-ACETAMINOPHEN 10-325 MG PO TABS: 1.0000 | ORAL_TABLET | ORAL | 0 refills | Status: DC | PRN

## 2017-10-02 MED ORDER — ONDANSETRON HCL 8 MG PO TABS: 8.0000 mg | ORAL_TABLET | Freq: Three times a day (TID) | ORAL | 5 refills | Status: DC | PRN

## 2017-10-02 NOTE — Progress Notes (Signed)
Subjective:    Patient ID: [name], male    DOB: [birth year], 62 y.o.   MRN: [id number]  HPI This patient was seen today for chronic pain. Take for low back pain, leg pain and neck pain  The medication list was reviewed and updated.   -Compliance with medication: yes Patient with burning discomfort into the left lower leg and in the lower back He did try an injection it did not help He relates severe pain he also relates neck pain burning into Patient is hoping not to have any type of surgery  - Number patient states they take daily: one daily  -when was the last dose patient took? yesterday  The patient was advised the importance of maintaining medication and not using illegal substances with these.  Here for refills and follow up  The patient was educated that we can provide 3 monthly scripts for their medication, it is their responsibility to follow the instructions.  Side effects or complications from medications: yes - makes him sick. nausea  Patient is aware that pain medications are meant to minimize the severity of the pain to allow their pain levels to improve to allow for better function. They are aware of that pain medications cannot totally remove their pain.  Due for UDT ( at least once per year) : has never had one but does not take pain med every day  Pt states he has something to talk with the doctor about that is private.   The patient has had some stress related issues.  We talked at length about anxiousness depression symptoms as well as PTSD symptoms in my opinion the patient has significant generalized anxiety disorder along with symptoms of PTSD he was advised to talk to the [place] he was also advised to consider starting serotonin reuptake inhibitor patient not suicidal  Patient deferred on the PHQ-9 and GAD-7        Review of Systems  Constitutional: Negative for activity change.  HENT: Negative for congestion and rhinorrhea.   Respiratory:  Negative for cough and shortness of breath.   Cardiovascular: Negative for chest pain.  Gastrointestinal: Negative for abdominal pain, diarrhea, nausea and vomiting.  Genitourinary: Negative for dysuria and hematuria.  Musculoskeletal: Positive for arthralgias and back pain.  Neurological: Negative for weakness and headaches.  Psychiatric/Behavioral: Positive for agitation. Negative for behavioral problems and confusion.   Patient denies being suicidal    Objective:   Physical Exam  Constitutional: He appears well-nourished.  Cardiovascular: Normal rate, regular rhythm and normal heart sounds.  No murmur heard. Pulmonary/Chest: Effort normal and breath sounds normal.  Musculoskeletal: He exhibits no edema.  Lymphadenopathy:    He has no cervical adenopathy.  Neurological: He is alert.  Psychiatric: His behavior is normal.  Vitals reviewed. Patient with burning pain into the left leg has good strength in his legs        Assessment & Plan:  Patient has significant stress anxiety depression symptoms along with PTSD he does not want to start treatment currently he is going to be contacting the [place] he states that once his chronic pain is doing better he will consider starting antidepressant through [place] or the [place]  Chronic sciatica left side Failed gabapentin due to side effects Try Lyrica 25 mg twice daily will gradually titrate this up Hydrocodone caused nausea Therefore try Percocet for severe pain cautioned drowsiness may use Zofran for any nausea patient will report back to [place] how he is doing Patient  will follow-up with [place] in 4 to 6 weeks Patient will be following up with Dr. Ellene Route for possible further injections or potentially surgery Patient does have some left foot weakness which he is aware of and he was told that this needs to be followed up thoroughly with neurosurgery  25 minutes was spent with the patient.  This statement verifies that 25 minutes was indeed spent with the  patient. Greater than half the time was spent in discussion, counseling and answering questions  regarding the issues that the patient came in for today as reflected in the diagnosis (s) please refer to documentation for further details. Greater than half the time was spent discussing his symptoms PTSD anxiety depression as well as chronic pain and discomfort  Patient was counseled should he become depressed or progressively agitated or upset he should follow-up with [place] immediately or ER he was advised to check  with the [place] for evaluation of possible PTSD

## 2017-10-03 ENCOUNTER — Telehealth: Payer: Self-pay | Admitting: *Deleted

## 2017-10-03 NOTE — Telephone Encounter (Signed)
[employer] approved patient's Lyrica 25mg  #60 one BID. Approval good 10/03/17-10/01/20. Pharmacy notified.

## 2017-10-04 ENCOUNTER — Ambulatory Visit: Payer: BLUE CROSS/BLUE SHIELD | Admitting: *Deleted

## 2017-10-04 DIAGNOSIS — I441 Atrioventricular block, second degree: Secondary | ICD-10-CM

## 2017-10-05 ENCOUNTER — Encounter: Payer: Self-pay | Admitting: Cardiology

## 2017-10-05 NOTE — Progress Notes (Signed)
Remote pacemaker transmission.   

## 2017-10-13 ENCOUNTER — Encounter (HOSPITAL_COMMUNITY): Payer: Self-pay | Admitting: Emergency Medicine

## 2017-10-13 ENCOUNTER — Emergency Department (HOSPITAL_COMMUNITY): Payer: BLUE CROSS/BLUE SHIELD

## 2017-10-13 ENCOUNTER — Emergency Department (HOSPITAL_COMMUNITY)
Admission: EM | Admit: 2017-10-13 | Discharge: 2017-10-13 | Disposition: A | Discharge status: Home / Self Care | Payer: BLUE CROSS/BLUE SHIELD | Attending: Emergency Medicine | Admitting: Emergency Medicine

## 2017-10-13 ENCOUNTER — Other Ambulatory Visit: Payer: Self-pay

## 2017-10-13 DIAGNOSIS — R110 Nausea: Secondary | ICD-10-CM | POA: Diagnosis not present

## 2017-10-13 DIAGNOSIS — Z7982 Long term (current) use of aspirin: Secondary | ICD-10-CM | POA: Diagnosis not present

## 2017-10-13 DIAGNOSIS — I2510 Atherosclerotic heart disease of native coronary artery without angina pectoris: Secondary | ICD-10-CM | POA: Insufficient documentation

## 2017-10-13 DIAGNOSIS — I10 Essential (primary) hypertension: Secondary | ICD-10-CM | POA: Diagnosis not present

## 2017-10-13 DIAGNOSIS — Z950 Presence of cardiac pacemaker: Secondary | ICD-10-CM | POA: Diagnosis not present

## 2017-10-13 DIAGNOSIS — Z87891 Personal history of nicotine dependence: Secondary | ICD-10-CM | POA: Diagnosis not present

## 2017-10-13 DIAGNOSIS — Z79899 Other long term (current) drug therapy: Secondary | ICD-10-CM | POA: Insufficient documentation

## 2017-10-13 DIAGNOSIS — Z91040 Latex allergy status: Secondary | ICD-10-CM | POA: Diagnosis not present

## 2017-10-13 DIAGNOSIS — K625 Hemorrhage of anus and rectum: Secondary | ICD-10-CM | POA: Insufficient documentation

## 2017-10-13 DIAGNOSIS — R109 Unspecified abdominal pain: Secondary | ICD-10-CM | POA: Diagnosis not present

## 2017-10-13 DIAGNOSIS — K529 Noninfective gastroenteritis and colitis, unspecified: Secondary | ICD-10-CM | POA: Insufficient documentation

## 2017-10-13 LAB — COMPREHENSIVE METABOLIC PANEL
ALT: 37 U/L (ref 0–44)
AST: 25 U/L (ref 15–41)
Albumin: 4.1 g/dL (ref 3.5–5.0)
Alkaline Phosphatase: 57 U/L (ref 38–126)
Anion gap: 7 (ref 5–15)
BUN: 20 mg/dL (ref 8–23)
CO2: 28 mmol/L (ref 22–32)
Calcium: 9.1 mg/dL (ref 8.9–10.3)
Chloride: 104 mmol/L (ref 98–111)
Creatinine, Ser: 1 mg/dL (ref 0.61–1.24)
GFR calc Af Amer: >60 mL/min (ref >60)
GFR calc non Af Amer: >60 mL/min (ref >60)
Glucose, Bld: 152 mg/dL — ABNORMAL HIGH (ref 70–99)
Potassium: 4 mmol/L (ref 3.5–5.1)
Sodium: 139 mmol/L (ref 135–145)
Total Bilirubin: 1.5 mg/dL — ABNORMAL HIGH (ref 0.3–1.2)
Total Protein: 6.8 g/dL (ref 6.5–8.1)

## 2017-10-13 LAB — CBC
HCT: 46.6 % (ref 39.0–52.0)
Hemoglobin: 15.4 g/dL (ref 13.0–17.0)
MCH: 30.5 pg (ref 26.0–34.0)
MCHC: 33 g/dL (ref 30.0–36.0)
MCV: 92.3 fL (ref 78.0–100.0)
Platelets: 173 10*3/uL (ref 150–400)
RBC: 5.05 MIL/uL (ref 4.22–5.81)
RDW: 13.8 % (ref 11.5–15.5)
WBC: 12.6 10*3/uL — ABNORMAL HIGH (ref 4.0–10.5)

## 2017-10-13 MED ORDER — IOPAMIDOL (ISOVUE-300) INJECTION 61%
100.0000 mL | Freq: Once | INTRAVENOUS | Status: AC | PRN
  Administered 2017-10-13: 100 mL via INTRAVENOUS

## 2017-10-13 MED ORDER — HYDROMORPHONE HCL 1 MG/ML IJ SOLN
0.5000 mg | Freq: Once | INTRAMUSCULAR | Status: AC
  Administered 2017-10-13: 0.5 mg via INTRAVENOUS
  Filled 2017-10-13: qty 1

## 2017-10-13 MED ORDER — DICYCLOMINE HCL 10 MG/ML IM SOLN
20.0000 mg | Freq: Once | INTRAMUSCULAR | Status: AC
  Administered 2017-10-13: 20 mg via INTRAMUSCULAR
  Filled 2017-10-13: qty 2

## 2017-10-13 MED ORDER — ONDANSETRON HCL 4 MG/2ML IJ SOLN
4.0000 mg | Freq: Once | INTRAMUSCULAR | Status: AC
  Administered 2017-10-13: 4 mg via INTRAVENOUS
  Filled 2017-10-13: qty 2

## 2017-10-13 MED ORDER — DICYCLOMINE HCL 20 MG PO TABS: 20.0000 mg | ORAL_TABLET | Freq: Three times a day (TID) | ORAL | 0 refills | Status: DC | PRN

## 2017-10-13 MED ORDER — FENTANYL CITRATE (PF) 100 MCG/2ML IJ SOLN
50.0000 ug | Freq: Once | INTRAMUSCULAR | Status: AC
  Administered 2017-10-13: 50 ug via INTRAVENOUS
  Filled 2017-10-13: qty 2

## 2017-10-13 MED ORDER — SODIUM CHLORIDE 0.9 % IV BOLUS
500.0000 mL | Freq: Once | INTRAVENOUS | Status: AC
  Administered 2017-10-13: 500 mL via INTRAVENOUS

## 2017-10-13 NOTE — ED Provider Notes (Signed)
[facility] EMERGENCY DEPARTMENT Provider Note   CSN: [id number] Arrival date & time: 10/13/17  1646     History   Chief Complaint Chief Complaint  Patient presents with  . Rectal Bleeding    HPI [name] is a 62 y.o. male.  HPI Patient with rectal plain and lower abdominal pain.  Began earlier today.  Began initially with feeling irritable bowel movement.  States he looked in the toilet paper and saw some blood on it.  Since then has had a feeling that he has to go and has gone a few times.  States now he is passing thick jellylike clots.  Pain is in the lower abdomen somewhat crampy.  No fevers but has had sweatiness and chills.  Has not had pains like this before.  No dysuria.  No other bleeding.  Not on anticoagulation. Past Medical History:  Diagnosis Date  . ADD (attention deficit disorder)    Rx-Adderall  . Anginal pain ([place])   . AV block, 3rd degree (H[facility])   . Colon polyps 2011   tubular adenoma by excisional biopsy during colonoscopy  . Complete heart block ([place])   . Coronary artery disease   . Degenerative disc disease, cervical   . GERD (gastroesophageal reflux disease)   . Hypertension   . Pacemaker   . Palpitations 2003   Holter in 2003: PACs and PVCs; negative stress nuclear test in 2005; right bundle branch block; echo in 2008-mild LVH, otherwise normal; 2008-negative stress nuclear test.  . Pneumonia   . Prostatitis    prostate calcifications by CT  . Sleep apnea    questionable diagnosis    Patient Active Problem List   Diagnosis Date Noted  . Hyperlipidemia 01/04/2016  . Anxiety as acute reaction to exceptional stress 05/28/2015  . OSA (obstructive sleep apnea) 10/16/2013  . Obstructive sleep apnea 08/25/2013  . BPH (benign prostatic hyperplasia) 08/25/2013  . Thoracic back pain 08/25/2013  . Chest pain, exertional 12/26/2012  . Intermediate coronary syndrome ([place]) 12/17/2012  . Bradycardia 12/17/2012  . Mobitz type II atrioventricular  block 12/17/2012  . Coronary atherosclerosis of native coronary artery 10/18/2012  . S/P drug eluting coronary stent placement 10/18/2012  . Exertional dyspnea 12/26/2011  . Abdominal  pain 12/26/2011  . Hypertension   . ADD (attention deficit disorder)   . Palpitations   . GERD (gastroesophageal reflux disease) 12/20/2011  . Hx of adenomatous colonic polyps 12/20/2011    Past Surgical History:  Procedure Laterality Date  . ANTERIOR CERVICAL DECOMP/DISCECTOMY FUSION    . Biventricular pacemaker upgrade/ system revision  04/02/14   removal of previous atrial and ventricular leads with placement of a new MDT Consulta CRT-P system at [facility] by Dr Violet Baldy  . COLONOSCOPY    . COLONOSCOPY W/ POLYPECTOMY  2011   tubular adenoma  . ESOPHAGOGASTRODUODENOSCOPY     Gastritis  . KNEE SURGERY     rt  . LEFT HEART CATHETERIZATION WITH CORONARY ANGIOGRAM N/A 10/17/2012   Procedure: LEFT HEART CATHETERIZATION WITH CORONARY ANGIOGRAM;  Surgeon: Josue Hector, MD;  Location: [facility] CATH LAB;  Service: Cardiovascular;  Laterality: N/A;  . LEFT HEART CATHETERIZATION WITH CORONARY ANGIOGRAM N/A 12/17/2012   Procedure: LEFT HEART CATHETERIZATION WITH CORONARY ANGIOGRAM;  Surgeon: Peter M Martinique, MD;  Location: [facility] CATH LAB;  Service: Cardiovascular;  Laterality: N/A;  . LEFT HEART CATHETERIZATION WITH CORONARY ANGIOGRAM N/A 06/11/2013   Procedure: LEFT HEART CATHETERIZATION WITH CORONARY ANGIOGRAM;  Surgeon: Wellington Hampshire,  MD;  Location: [place] CATH LAB;  Service: Cardiovascular;  Laterality: N/A;  . PACEMAKER INSERTION  12/17/12   MDT Adapta L implanted by Dr Rayann Heman for mobitz II second degree AV block  . PERCUTANEOUS CORONARY STENT INTERVENTION (PCI-S)  10/17/2012   Procedure: PERCUTANEOUS CORONARY STENT INTERVENTION (PCI-S);  Surgeon: Josue Hector, MD;  Location: [facility] CATH LAB;  Service: Cardiovascular;;  . PERMANENT PACEMAKER INSERTION N/A 12/17/2012   Procedure: PERMANENT PACEMAKER INSERTION;   Surgeon: Thompson Grayer, MD;  Location: [facility] CATH LAB;  Service: Cardiovascular;  Laterality: N/A;  . POLYPECTOMY    . UPPER GASTROINTESTINAL ENDOSCOPY          Home Medications    Prior to Admission medications   Medication Sig Start Date End Date Taking? Authorizing Provider  amLODipine (NORVASC) 5 MG tablet Take 1 tablet (5 mg total) by mouth daily. 07/12/17  Yes Kathyrn Drown, MD  aspirin 81 MG chewable tablet Chew 81 mg by mouth daily.   Yes [provider]  esomeprazole (NEXIUM) 40 MG capsule Take 1 capsule (40 mg total) by mouth daily. 05/10/17  Yes Kathyrn Drown, MD  hydrALAZINE (APRESOLINE) 50 MG tablet Take 50 mg by mouth 2 (two) times daily.   Yes [provider]  HYDROcodone-acetaminophen (NORCO) 10-325 MG tablet Take 1 tablet by mouth every 6 (six) hours as needed for severe pain.   Yes [provider]  losartan (COZAAR) 100 MG tablet Take 1 tablet (100 mg total) by mouth daily. 08/16/17  Yes Luking, Elayne Snare, MD  nadolol (CORGARD) 80 MG tablet Take 1 tablet (80 mg total) by mouth daily. 12/01/16  Yes Herminio Commons, MD  pregabalin (LYRICA) 25 MG capsule Take 1 capsule (25 mg total) by mouth 2 (two) times daily. 10/02/17  Yes Kathyrn Drown, MD  rosuvastatin (CRESTOR) 20 MG tablet Take 1 tablet (20 mg total) by mouth daily. 05/10/17  Yes Kathyrn Drown, MD  tamsulosin (FLOMAX) 0.4 MG CAPS capsule Take 2 capsules (0.8 mg total) by mouth daily. 11/28/16  Yes Kathyrn Drown, MD  ALPRAZolam Duanne Moron) 0.5 MG tablet Take 1 tablet (0.5 mg total) by mouth 3 (three) times daily as needed for sleep or anxiety. 05/10/17   Kathyrn Drown, MD  cyclobenzaprine (FLEXERIL) 5 MG tablet Take 5 mg by mouth at bedtime as needed for muscle spasms.    [provider]  dicyclomine (BENTYL) 20 MG tablet Take 1 tablet (20 mg total) by mouth 3 (three) times daily as needed for spasms. 10/13/17   Davonna Belling, MD  nitroGLYCERIN (NITROSTAT) 0.4 MG SL tablet Place 1  tablet (0.4 mg total) under the tongue every 5 (five) minutes as needed for chest pain (MAX 3 TABLETS). 06/01/15   Lendon Colonel, NP  ondansetron (ZOFRAN) 8 MG tablet Take 1 tablet (8 mg total) by mouth every 8 (eight) hours as needed for nausea. 10/02/17   Kathyrn Drown, MD    Family History Family History  Problem Relation Age of Onset  . Heart disease Mother   . Hypertension Mother        Carotid disease  . Prostate cancer Brother   . Breast cancer Maternal Aunt   . Colon cancer Neg Hx   . Colon polyps Neg Hx     Social History Social History   Tobacco Use  . Smoking status: Former Smoker    Packs/day: 2.50    Years: 40.00    Pack years: 100.00  Types: Cigarettes    Start date: 04/18/1967    Last attempt to quit: 12/20/2003    Years since quitting: 13.8  . Smokeless tobacco: Former Network engineer Use Topics  . Alcohol use: Yes    Alcohol/week: 0.6 oz    Types: 1 Shots of liquor per week    Comment: occasional  . Drug use: Yes    Types: Marijuana    Comment: 30 + years ago - "crank, marjiuanna, ect."     Allergies   Morphine and related; Lasix [furosemide]; and Latex   Review of Systems Review of Systems  Constitutional: Positive for chills. Negative for appetite change.  HENT: Negative for congestion.   Respiratory: Negative for shortness of breath.   Cardiovascular: Negative for chest pain.  Gastrointestinal: Positive for abdominal pain, blood in stool and nausea.  Genitourinary: Negative for flank pain.  Musculoskeletal: Negative for back pain.  Skin: Negative for wound.  Neurological: Negative for weakness.  Hematological: Does not bruise/bleed easily.     Physical Exam Updated Vital Signs BP (!) 188/102 (BP Location: Right Arm)   Pulse 60   Temp 97.8 F (36.6 C) (Temporal)   Resp 16   Ht 6' (1.829 m)   Wt 101.2 kg (223 lb)   SpO2 96%   BMI 30.24 kg/m   Physical Exam  Constitutional: He appears well-developed.  Patient appears  uncomfortable  HENT:  Head: Atraumatic.  Eyes: Pupils are equal, round, and reactive to light.  Cardiovascular: Normal rate.  Pulmonary/Chest: Effort normal.  Abdominal: Soft. There is tenderness.  Suprapubic to left lower quadrant tenderness.  No rebound or guarding.  Reducible inguinal hernias.  Musculoskeletal: He exhibits no tenderness.  Neurological: He is alert.  Skin: Skin is warm. Capillary refill takes less than 2 seconds.  Psychiatric: He has a normal mood and affect.     ED Treatments / Results  Labs (all labs ordered are listed, but only abnormal results are displayed) Labs Reviewed  COMPREHENSIVE METABOLIC PANEL - Abnormal; Notable for the following components:      Result Value   Glucose, Bld 152 (*)    Total Bilirubin 1.5 (*)    All other components within normal limits  CBC - Abnormal; Notable for the following components:   WBC 12.6 (*)    All other components within normal limits    EKG None  Radiology Ct Abdomen Pelvis W Contrast  Result Date: 10/13/2017 CLINICAL DATA:  Rectal bleeding.  Lower abdominal pain. EXAM: CT ABDOMEN AND PELVIS WITH CONTRAST TECHNIQUE: Multidetector CT imaging of the abdomen and pelvis was performed using the standard protocol following bolus administration of intravenous contrast. CONTRAST:  179mL ISOVUE-300 IOPAMIDOL (ISOVUE-300) INJECTION 61% COMPARISON:  July 03, 2017 FINDINGS: Lower chest: Pacemaker leads noted. Lung bases otherwise unremarkable. Hepatobiliary: No focal liver abnormality is seen. No gallstones, gallbladder wall thickening, or biliary dilatation. Pancreas: Unremarkable. No pancreatic ductal dilatation or surrounding inflammatory changes. Spleen: Normal in size without focal abnormality. Adrenals/Urinary Tract: Adrenal glands are unremarkable. Kidneys are normal, without renal calculi, focal lesion, or hydronephrosis. Bladder is unremarkable. Stomach/Bowel: The stomach and small bowel are normal. There is poor  distention of the distal transverse colon, the descending colon, and the proximal sigmoid colon which limits evaluation. There is minimal increased attenuation in the pericolonic fat in a few locations and the surface of the colon in these regions is mildly irregular. While not determinant, the findings are at least somewhat concerning for mild colitis. There are a few  colonic diverticuli but no focal inflammation around the diverticuli to suggest diverticulitis. The more proximal colon is normal in appearance. The appendix is normal. Vascular/Lymphatic: There is atherosclerotic change in the nonaneurysmal aorta. Of note, the IMA enhances. No adenopathy. Reproductive: Prostate is unremarkable. Other: There is a fat containing left inguinal hernia. No free air or free fluid. Musculoskeletal: No acute or significant osseous findings. IMPRESSION: 1. There is poor distention of the distal transverse colon, the descending colon, and proximal sigmoid colon which limits evaluation of these portions of the colon. Based on CT imaging, there is the possibility of a subtle early colitis which could be infectious, inflammatory, or ischemic. Recommend clinical correlation and follow-up as clinically warranted. 2. No diverticulitis. 3. Atherosclerotic changes in the nonaneurysmal aorta. 4. Fat containing inguinal hernia. Electronically Signed   By: Dorise Bullion III M.D   On: 10/13/2017 19:00    Procedures Procedures (including critical care time)  Medications Ordered in ED Medications  sodium chloride 0.9 % bolus 500 mL (0 mLs Intravenous Stopped 10/13/17 1901)  fentaNYL (SUBLIMAZE) injection 50 mcg (50 mcg Intravenous Given 10/13/17 1735)  ondansetron (ZOFRAN) injection 4 mg (4 mg Intravenous Given 10/13/17 1735)  iopamidol (ISOVUE-300) 61 % injection 100 mL (100 mLs Intravenous Contrast Given 10/13/17 1826)  HYDROmorphone (DILAUDID) injection 0.5 mg (0.5 mg Intravenous Given 10/13/17 1818)  dicyclomine (BENTYL)  injection 20 mg (20 mg Intramuscular Given 10/13/17 2108)     Initial Impression / Assessment and Plan / ED Course  I have reviewed the triage vital signs and the nursing notes.  Pertinent labs & imaging results that were available during my care of the patient were reviewed by me and considered in my medical decision making (see chart for details).     No patient with rectal bleeding.  Possible colitis on CT.  Hemoglobin reassuring.  Has pain but otherwise not lightheadedness or dizziness.  We will not give empiric antibiotics.  Will have follow-up with gastroenterology.  Will return for lightheaded dizziness or continued bleeding.  Discharge home.  Final Clinical Impressions(s) / ED Diagnoses   Final diagnoses:  Rectal bleeding  Colitis    ED Discharge Orders        Ordered    dicyclomine (BENTYL) 20 MG tablet  3 times daily PRN     10/13/17 2111       Davonna Belling, MD 10/13/17 2353

## 2017-10-13 NOTE — ED Notes (Signed)
Pt transported to CT ?

## 2017-10-13 NOTE — ED Notes (Signed)
Pt ambulated to BR passed 2 large bright red blood clots from rectum

## 2017-10-13 NOTE — ED Triage Notes (Signed)
Pt c/o rectal bleeding and lower abd pain starting today. Last colonoscopy was a year ago. Denies blood thinner use.

## 2017-10-13 NOTE — Discharge Instructions (Signed)
Return to the ER for worsening pain or lightheadedness or dizziness.  Return for fevers call GI otherwise for short-term follow-up.

## 2017-10-13 NOTE — ED Notes (Signed)
EDP at bedside  

## 2017-10-15 ENCOUNTER — Encounter (HOSPITAL_COMMUNITY): Payer: Self-pay | Admitting: Emergency Medicine

## 2017-10-15 ENCOUNTER — Telehealth: Payer: Self-pay | Admitting: Family Medicine

## 2017-10-15 ENCOUNTER — Inpatient Hospital Stay (HOSPITAL_COMMUNITY)
Admission: EM | Admit: 2017-10-15 | Discharge: 2017-10-17 | DRG: 394 | Disposition: A | Discharge status: Home / Self Care | Payer: BLUE CROSS/BLUE SHIELD | Attending: Internal Medicine | Admitting: Internal Medicine

## 2017-10-15 ENCOUNTER — Other Ambulatory Visit: Payer: Self-pay

## 2017-10-15 DIAGNOSIS — I2510 Atherosclerotic heart disease of native coronary artery without angina pectoris: Secondary | ICD-10-CM

## 2017-10-15 DIAGNOSIS — K2950 Unspecified chronic gastritis without bleeding: Secondary | ICD-10-CM

## 2017-10-15 DIAGNOSIS — Z79899 Other long term (current) drug therapy: Secondary | ICD-10-CM | POA: Diagnosis not present

## 2017-10-15 DIAGNOSIS — I442 Atrioventricular block, complete: Secondary | ICD-10-CM | POA: Diagnosis not present

## 2017-10-15 DIAGNOSIS — K5903 Drug induced constipation: Secondary | ICD-10-CM | POA: Diagnosis not present

## 2017-10-15 DIAGNOSIS — I951 Orthostatic hypotension: Secondary | ICD-10-CM | POA: Diagnosis not present

## 2017-10-15 DIAGNOSIS — K4090 Unilateral inguinal hernia, without obstruction or gangrene, not specified as recurrent: Secondary | ICD-10-CM | POA: Diagnosis present

## 2017-10-15 DIAGNOSIS — N400 Enlarged prostate without lower urinary tract symptoms: Secondary | ICD-10-CM | POA: Diagnosis present

## 2017-10-15 DIAGNOSIS — T402X5A Adverse effect of other opioids, initial encounter: Secondary | ICD-10-CM | POA: Diagnosis not present

## 2017-10-15 DIAGNOSIS — R110 Nausea: Secondary | ICD-10-CM | POA: Diagnosis not present

## 2017-10-15 DIAGNOSIS — M5116 Intervertebral disc disorders with radiculopathy, lumbar region: Secondary | ICD-10-CM | POA: Diagnosis not present

## 2017-10-15 DIAGNOSIS — K5289 Other specified noninfective gastroenteritis and colitis: Secondary | ICD-10-CM

## 2017-10-15 DIAGNOSIS — Z8249 Family history of ischemic heart disease and other diseases of the circulatory system: Secondary | ICD-10-CM

## 2017-10-15 DIAGNOSIS — K219 Gastro-esophageal reflux disease without esophagitis: Secondary | ICD-10-CM

## 2017-10-15 DIAGNOSIS — Z7982 Long term (current) use of aspirin: Secondary | ICD-10-CM | POA: Diagnosis not present

## 2017-10-15 DIAGNOSIS — I10 Essential (primary) hypertension: Secondary | ICD-10-CM | POA: Diagnosis not present

## 2017-10-15 DIAGNOSIS — R011 Cardiac murmur, unspecified: Secondary | ICD-10-CM | POA: Diagnosis not present

## 2017-10-15 DIAGNOSIS — M545 Low back pain: Secondary | ICD-10-CM

## 2017-10-15 DIAGNOSIS — Z981 Arthrodesis status: Secondary | ICD-10-CM

## 2017-10-15 DIAGNOSIS — Z955 Presence of coronary angioplasty implant and graft: Secondary | ICD-10-CM | POA: Diagnosis not present

## 2017-10-15 DIAGNOSIS — Z950 Presence of cardiac pacemaker: Secondary | ICD-10-CM | POA: Diagnosis not present

## 2017-10-15 DIAGNOSIS — Z91040 Latex allergy status: Secondary | ICD-10-CM

## 2017-10-15 DIAGNOSIS — K922 Gastrointestinal hemorrhage, unspecified: Secondary | ICD-10-CM

## 2017-10-15 DIAGNOSIS — Z87891 Personal history of nicotine dependence: Secondary | ICD-10-CM | POA: Diagnosis not present

## 2017-10-15 DIAGNOSIS — K921 Melena: Secondary | ICD-10-CM | POA: Diagnosis not present

## 2017-10-15 DIAGNOSIS — K559 Vascular disorder of intestine, unspecified: Principal | ICD-10-CM | POA: Diagnosis present

## 2017-10-15 DIAGNOSIS — K625 Hemorrhage of anus and rectum: Secondary | ICD-10-CM

## 2017-10-15 DIAGNOSIS — Z885 Allergy status to narcotic agent status: Secondary | ICD-10-CM

## 2017-10-15 DIAGNOSIS — Z888 Allergy status to other drugs, medicaments and biological substances status: Secondary | ICD-10-CM

## 2017-10-15 DIAGNOSIS — R109 Unspecified abdominal pain: Secondary | ICD-10-CM | POA: Diagnosis not present

## 2017-10-15 DIAGNOSIS — K648 Other hemorrhoids: Secondary | ICD-10-CM

## 2017-10-15 DIAGNOSIS — Z79891 Long term (current) use of opiate analgesic: Secondary | ICD-10-CM

## 2017-10-15 DIAGNOSIS — D126 Benign neoplasm of colon, unspecified: Secondary | ICD-10-CM | POA: Diagnosis not present

## 2017-10-15 DIAGNOSIS — G8929 Other chronic pain: Secondary | ICD-10-CM | POA: Diagnosis present

## 2017-10-15 HISTORY — DX: Gastrointestinal hemorrhage, unspecified: K92.2

## 2017-10-15 LAB — CBC
HCT: 52.4 % — ABNORMAL HIGH (ref 39.0–52.0)
HCT: 50.3 % (ref 39.0–52.0)
Hemoglobin: 17 g/dL (ref 13.0–17.0)
Hemoglobin: 16.3 g/dL (ref 13.0–17.0)
MCH: 29.9 pg (ref 26.0–34.0)
MCH: 29.6 pg (ref 26.0–34.0)
MCHC: 32.4 g/dL (ref 30.0–36.0)
MCHC: 32.4 g/dL (ref 30.0–36.0)
MCV: 92.3 fL (ref 78.0–100.0)
MCV: 91.3 fL (ref 78.0–100.0)
Platelets: 234 10*3/uL (ref 150–400)
Platelets: 172 10*3/uL (ref 150–400)
RBC: 5.68 MIL/uL (ref 4.22–5.81)
RBC: 5.51 MIL/uL (ref 4.22–5.81)
RDW: 13.4 % (ref 11.5–15.5)
RDW: 13.4 % (ref 11.5–15.5)
WBC: 16.9 10*3/uL — ABNORMAL HIGH (ref 4.0–10.5)
WBC: 14.6 10*3/uL — ABNORMAL HIGH (ref 4.0–10.5)

## 2017-10-15 LAB — TYPE AND SCREEN
ABO/RH(D): O POS
Antibody Screen: NEGATIVE

## 2017-10-15 LAB — COMPREHENSIVE METABOLIC PANEL
ALT: 23 U/L (ref 0–44)
AST: 21 U/L (ref 15–41)
Albumin: 4 g/dL (ref 3.5–5.0)
Alkaline Phosphatase: 52 U/L (ref 38–126)
Anion gap: 14 (ref 5–15)
BUN: 11 mg/dL (ref 8–23)
CO2: 21 mmol/L — ABNORMAL LOW (ref 22–32)
Calcium: 9.3 mg/dL (ref 8.9–10.3)
Chloride: 103 mmol/L (ref 98–111)
Creatinine, Ser: 1.08 mg/dL (ref 0.61–1.24)
GFR calc Af Amer: >60 mL/min (ref >60)
GFR calc non Af Amer: >60 mL/min (ref >60)
Glucose, Bld: 113 mg/dL — ABNORMAL HIGH (ref 70–99)
Potassium: 4.2 mmol/L (ref 3.5–5.1)
Sodium: 138 mmol/L (ref 135–145)
Total Bilirubin: 2.4 mg/dL — ABNORMAL HIGH (ref 0.3–1.2)
Total Protein: 6.7 g/dL (ref 6.5–8.1)

## 2017-10-15 LAB — ABO/RH: ABO/RH(D): O POS

## 2017-10-15 LAB — LACTIC ACID, PLASMA: Lactic Acid, Venous: 0.9 mmol/L (ref 0.5–1.9)

## 2017-10-15 LAB — LACTATE DEHYDROGENASE: LDH: 162 U/L (ref 98–192)

## 2017-10-15 MED ORDER — HYDROMORPHONE HCL 1 MG/ML IJ SOLN
0.5000 mg | INTRAMUSCULAR | Status: DC | PRN
  Administered 2017-10-15 – 2017-10-16 (×4): 0.5 mg via INTRAVENOUS
  Filled 2017-10-15 (×4): qty 1

## 2017-10-15 MED ORDER — SODIUM CHLORIDE 0.9 % IV BOLUS
1000.0000 mL | Freq: Once | INTRAVENOUS | Status: AC
  Administered 2017-10-15: 1000 mL via INTRAVENOUS

## 2017-10-15 MED ORDER — METRONIDAZOLE IN NACL 5-0.79 MG/ML-% IV SOLN: 500.0000 mg | Freq: Once | INTRAVENOUS | Status: DC

## 2017-10-15 MED ORDER — AMLODIPINE BESYLATE 5 MG PO TABS
5.0000 mg | ORAL_TABLET | Freq: Every day | ORAL | Status: DC
  Administered 2017-10-15 – 2017-10-16 (×2): 5 mg via ORAL
  Filled 2017-10-15 (×2): qty 1

## 2017-10-15 MED ORDER — SODIUM CHLORIDE 0.9 % IV SOLN
INTRAVENOUS | Status: DC
  Administered 2017-10-15 – 2017-10-16 (×3): via INTRAVENOUS

## 2017-10-15 MED ORDER — CIPROFLOXACIN IN D5W 400 MG/200ML IV SOLN: 400.0000 mg | Freq: Once | INTRAVENOUS | Status: DC

## 2017-10-15 MED ORDER — ASPIRIN 81 MG PO CHEW
81.0000 mg | CHEWABLE_TABLET | Freq: Every day | ORAL | Status: DC
  Administered 2017-10-16: 81 mg via ORAL
  Filled 2017-10-15: qty 1

## 2017-10-15 MED ORDER — ALPRAZOLAM 0.5 MG PO TABS
0.5000 mg | ORAL_TABLET | Freq: Three times a day (TID) | ORAL | Status: DC | PRN
  Administered 2017-10-15 – 2017-10-16 (×2): 0.5 mg via ORAL
  Filled 2017-10-15 (×2): qty 1

## 2017-10-15 MED ORDER — TAMSULOSIN HCL 0.4 MG PO CAPS
0.8000 mg | ORAL_CAPSULE | Freq: Every day | ORAL | Status: DC
  Administered 2017-10-16: 0.8 mg via ORAL
  Filled 2017-10-15 (×2): qty 2

## 2017-10-15 MED ORDER — CIPROFLOXACIN HCL 500 MG PO TABS
500.0000 mg | ORAL_TABLET | Freq: Two times a day (BID) | ORAL | Status: DC
  Administered 2017-10-15 – 2017-10-16 (×3): 500 mg via ORAL
  Filled 2017-10-15 (×3): qty 1

## 2017-10-15 MED ORDER — ONDANSETRON HCL 4 MG PO TABS
4.0000 mg | ORAL_TABLET | Freq: Two times a day (BID) | ORAL | Status: DC
  Administered 2017-10-15 – 2017-10-16 (×3): 4 mg via ORAL
  Filled 2017-10-15 (×3): qty 1

## 2017-10-15 MED ORDER — METRONIDAZOLE 500 MG PO TABS
500.0000 mg | ORAL_TABLET | Freq: Two times a day (BID) | ORAL | Status: DC
  Administered 2017-10-15 – 2017-10-16 (×3): 500 mg via ORAL
  Filled 2017-10-15 (×3): qty 1

## 2017-10-15 MED ORDER — ACETAMINOPHEN 650 MG RE SUPP: 650.0000 mg | Freq: Four times a day (QID) | RECTAL | Status: DC | PRN

## 2017-10-15 MED ORDER — ACETAMINOPHEN 325 MG PO TABS: 650.0000 mg | ORAL_TABLET | Freq: Four times a day (QID) | ORAL | Status: DC | PRN

## 2017-10-15 NOTE — ED Triage Notes (Signed)
Pt. Stated, I was in the [place] on 29 and was bleeding quit a bit. Thy gave me a CT scan and ? Didn't know. Told to come here if continued. It continues to bleed from rectum.

## 2017-10-15 NOTE — ED Provider Notes (Signed)
[facility] EMERGENCY DEPARTMENT Provider Note   CSN: [id number] Arrival date & time: 10/15/17  0941     History   Chief Complaint Chief Complaint  Patient presents with  . Rectal Bleeding  . Abdominal Pain    HPI [name] is a 62 y.o. male.  Patient states that he started with rectal bleeding on Saturday it is maroon color he is also having some abdominal discomfort.  He was seen at [facility] emergency department had a CT scan of the abdomen which suggested colitis but radiologist stated that the colon was not seen well  The history is provided by the patient. No language interpreter was used.  Rectal Bleeding  Quality:  Maroon Amount:  Moderate Timing:  Constant Chronicity:  New Context: not anal fissures   Similar prior episodes: yes   Relieved by:  Nothing Worsened by:  Nothing Ineffective treatments:  None tried Associated symptoms: abdominal pain   Risk factors: no hx of IBD   Abdominal Pain   Associated symptoms include hematochezia. Pertinent negatives include diarrhea, frequency, hematuria and headaches.    Past Medical History:  Diagnosis Date  . ADD (attention deficit disorder)    Rx-Adderall  . Anginal pain ([place])   . AV block, 3rd degree ([facility])   . Colon polyps 2011   tubular adenoma by excisional biopsy during colonoscopy  . Complete heart block ([place])   . Coronary artery disease   . Degenerative disc disease, cervical   . GERD (gastroesophageal reflux disease)   . Hypertension   . Pacemaker   . Palpitations 2003   Holter in 2003: PACs and PVCs; negative stress nuclear test in 2005; right bundle branch block; echo in 2008-mild LVH, otherwise normal; 2008-negative stress nuclear test.  . Pneumonia   . Prostatitis    prostate calcifications by CT  . Sleep apnea    questionable diagnosis    Patient Active Problem List   Diagnosis Date Noted  . Hyperlipidemia 01/04/2016  . Anxiety as acute reaction to exceptional stress  05/28/2015  . OSA (obstructive sleep apnea) 10/16/2013  . Obstructive sleep apnea 08/25/2013  . BPH (benign prostatic hyperplasia) 08/25/2013  . Thoracic back pain 08/25/2013  . Chest pain, exertional 12/26/2012  . Intermediate coronary syndrome ([place]) 12/17/2012  . Bradycardia 12/17/2012  . Mobitz type II atrioventricular block 12/17/2012  . Coronary atherosclerosis of native coronary artery 10/18/2012  . S/P drug eluting coronary stent placement 10/18/2012  . Exertional dyspnea 12/26/2011  . Abdominal  pain 12/26/2011  . Hypertension   . ADD (attention deficit disorder)   . Palpitations   . GERD (gastroesophageal reflux disease) 12/20/2011  . Hx of adenomatous colonic polyps 12/20/2011    Past Surgical History:  Procedure Laterality Date  . ANTERIOR CERVICAL DECOMP/DISCECTOMY FUSION    . Biventricular pacemaker upgrade/ system revision  04/02/14   removal of previous atrial and ventricular leads with placement of a new MDT Consulta CRT-P system at [facility] by Dr Violet Baldy  . COLONOSCOPY    . COLONOSCOPY W/ POLYPECTOMY  2011   tubular adenoma  . ESOPHAGOGASTRODUODENOSCOPY     Gastritis  . KNEE SURGERY     rt  . LEFT HEART CATHETERIZATION WITH CORONARY ANGIOGRAM N/A 10/17/2012   Procedure: LEFT HEART CATHETERIZATION WITH CORONARY ANGIOGRAM;  Surgeon: Josue Hector, MD;  Location: [facility] CATH LAB;  Service: Cardiovascular;  Laterality: N/A;  . LEFT HEART CATHETERIZATION WITH CORONARY ANGIOGRAM N/A 12/17/2012   Procedure: LEFT HEART CATHETERIZATION  WITH CORONARY ANGIOGRAM;  Surgeon: Peter M Martinique, MD;  Location: [facility] CATH LAB;  Service: Cardiovascular;  Laterality: N/A;  . LEFT HEART CATHETERIZATION WITH CORONARY ANGIOGRAM N/A 06/11/2013   Procedure: LEFT HEART CATHETERIZATION WITH CORONARY ANGIOGRAM;  Surgeon: Wellington Hampshire, MD;  Location: [facility] CATH LAB;  Service: Cardiovascular;  Laterality: N/A;  . PACEMAKER INSERTION  12/17/12   MDT Adapta L implanted by Dr Rayann Heman for  mobitz II second degree AV block  . PERCUTANEOUS CORONARY STENT INTERVENTION (PCI-S)  10/17/2012   Procedure: PERCUTANEOUS CORONARY STENT INTERVENTION (PCI-S);  Surgeon: Josue Hector, MD;  Location: [facility] CATH LAB;  Service: Cardiovascular;;  . PERMANENT PACEMAKER INSERTION N/A 12/17/2012   Procedure: PERMANENT PACEMAKER INSERTION;  Surgeon: Thompson Grayer, MD;  Location: [facility] CATH LAB;  Service: Cardiovascular;  Laterality: N/A;  . POLYPECTOMY    . UPPER GASTROINTESTINAL ENDOSCOPY          Home Medications    Prior to Admission medications   Medication Sig Start Date End Date Taking? Authorizing Provider  ALPRAZolam Duanne Moron) 0.5 MG tablet Take 1 tablet (0.5 mg total) by mouth 3 (three) times daily as needed for sleep or anxiety. 05/10/17   Kathyrn Drown, MD  amLODipine (NORVASC) 5 MG tablet Take 1 tablet (5 mg total) by mouth daily. 07/12/17   Kathyrn Drown, MD  aspirin 81 MG chewable tablet Chew 81 mg by mouth daily.    [provider]  cyclobenzaprine (FLEXERIL) 5 MG tablet Take 5 mg by mouth at bedtime as needed for muscle spasms.    [provider]  dicyclomine (BENTYL) 20 MG tablet Take 1 tablet (20 mg total) by mouth 3 (three) times daily as needed for spasms. 10/13/17   Davonna Belling, MD  esomeprazole (NEXIUM) 40 MG capsule Take 1 capsule (40 mg total) by mouth daily. 05/10/17   Kathyrn Drown, MD  hydrALAZINE (APRESOLINE) 50 MG tablet Take 50 mg by mouth 2 (two) times daily.    [provider]  HYDROcodone-acetaminophen (NORCO) 10-325 MG tablet Take 1 tablet by mouth every 6 (six) hours as needed for severe pain.    [provider]  losartan (COZAAR) 100 MG tablet Take 1 tablet (100 mg total) by mouth daily. 08/16/17   Kathyrn Drown, MD  nadolol (CORGARD) 80 MG tablet Take 1 tablet (80 mg total) by mouth daily. 12/01/16   Herminio Commons, MD  nitroGLYCERIN (NITROSTAT) 0.4 MG SL tablet Place 1 tablet (0.4 mg total) under the tongue every 5 (five)  minutes as needed for chest pain (MAX 3 TABLETS). 06/01/15   Lendon Colonel, NP  ondansetron (ZOFRAN) 8 MG tablet Take 1 tablet (8 mg total) by mouth every 8 (eight) hours as needed for nausea. 10/02/17   Kathyrn Drown, MD  pregabalin (LYRICA) 25 MG capsule Take 1 capsule (25 mg total) by mouth 2 (two) times daily. 10/02/17   Kathyrn Drown, MD  rosuvastatin (CRESTOR) 20 MG tablet Take 1 tablet (20 mg total) by mouth daily. 05/10/17   Kathyrn Drown, MD  tamsulosin (FLOMAX) 0.4 MG CAPS capsule Take 2 capsules (0.8 mg total) by mouth daily. 11/28/16   Kathyrn Drown, MD    Family History Family History  Problem Relation Age of Onset  . Heart disease Mother   . Hypertension Mother        Carotid disease  . Prostate cancer Brother   . Breast cancer Maternal Aunt   . Colon cancer Neg Hx   .  Colon polyps Neg Hx     Social History Social History   Tobacco Use  . Smoking status: Former Smoker    Packs/day: 2.50    Years: 40.00    Pack years: 100.00    Types: Cigarettes    Start date: 04/18/1967    Last attempt to quit: 12/20/2003    Years since quitting: 13.8  . Smokeless tobacco: Former Network engineer Use Topics  . Alcohol use: Yes    Alcohol/week: 0.6 oz    Types: 1 Shots of liquor per week    Comment: occasional  . Drug use: Yes    Types: Marijuana    Comment: 30 + years ago - "crank, marjiuanna, ect."     Allergies   Morphine and related; Lasix [furosemide]; and Latex   Review of Systems Review of Systems  Constitutional: Negative for appetite change and fatigue.  HENT: Negative for congestion, ear discharge and sinus pressure.   Eyes: Negative for discharge.  Respiratory: Negative for cough.   Cardiovascular: Negative for chest pain.  Gastrointestinal: Positive for abdominal pain and hematochezia. Negative for diarrhea.       Rectal bleeding  Genitourinary: Negative for frequency and hematuria.  Musculoskeletal: Negative for back pain.  Skin: Negative for  rash.  Neurological: Negative for seizures and headaches.  Psychiatric/Behavioral: Negative for hallucinations.     Physical Exam Updated Vital Signs BP (!) 153/75   Pulse 74   Resp 18   Ht 6' (1.829 m)   Wt 101.2 kg (223 lb)   SpO2 97%   BMI 30.24 kg/m   Physical Exam  Constitutional: He is oriented to person, place, and time. He appears well-developed.  HENT:  Head: Normocephalic.  Eyes: Conjunctivae and EOM are normal. No scleral icterus.  Neck: Neck supple. No thyromegaly present.  Cardiovascular: Normal rate and regular rhythm. Exam reveals no gallop and no friction rub.  No murmur heard. Pulmonary/Chest: No stridor. He has no wheezes. He has no rales. He exhibits no tenderness.  Abdominal: He exhibits no distension. There is tenderness. There is no rebound.  Musculoskeletal: Normal range of motion. He exhibits no edema.  Lymphadenopathy:    He has no cervical adenopathy.  Neurological: He is oriented to person, place, and time. He exhibits normal muscle tone. Coordination normal.  Skin: No rash noted. No erythema.  Psychiatric: He has a normal mood and affect. His behavior is normal.     ED Treatments / Results  Labs (all labs ordered are listed, but only abnormal results are displayed) Labs Reviewed  COMPREHENSIVE METABOLIC PANEL - Abnormal; Notable for the following components:      Result Value   CO2 21 (*)    Glucose, Bld 113 (*)    Total Bilirubin 2.4 (*)    All other components within normal limits  CBC - Abnormal; Notable for the following components:   WBC 16.9 (*)    HCT 52.4 (*)    All other components within normal limits  POC OCCULT BLOOD, ED  TYPE AND SCREEN  ABO/RH    EKG None  Radiology Ct Abdomen Pelvis W Contrast  Result Date: 10/13/2017 CLINICAL DATA:  Rectal bleeding.  Lower abdominal pain. EXAM: CT ABDOMEN AND PELVIS WITH CONTRAST TECHNIQUE: Multidetector CT imaging of the abdomen and pelvis was performed using the standard  protocol following bolus administration of intravenous contrast. CONTRAST:  153mL ISOVUE-300 IOPAMIDOL (ISOVUE-300) INJECTION 61% COMPARISON:  July 03, 2017 FINDINGS: Lower chest: Pacemaker leads noted. Lung bases otherwise  unremarkable. Hepatobiliary: No focal liver abnormality is seen. No gallstones, gallbladder wall thickening, or biliary dilatation. Pancreas: Unremarkable. No pancreatic ductal dilatation or surrounding inflammatory changes. Spleen: Normal in size without focal abnormality. Adrenals/Urinary Tract: Adrenal glands are unremarkable. Kidneys are normal, without renal calculi, focal lesion, or hydronephrosis. Bladder is unremarkable. Stomach/Bowel: The stomach and small bowel are normal. There is poor distention of the distal transverse colon, the descending colon, and the proximal sigmoid colon which limits evaluation. There is minimal increased attenuation in the pericolonic fat in a few locations and the surface of the colon in these regions is mildly irregular. While not determinant, the findings are at least somewhat concerning for mild colitis. There are a few colonic diverticuli but no focal inflammation around the diverticuli to suggest diverticulitis. The more proximal colon is normal in appearance. The appendix is normal. Vascular/Lymphatic: There is atherosclerotic change in the nonaneurysmal aorta. Of note, the IMA enhances. No adenopathy. Reproductive: Prostate is unremarkable. Other: There is a fat containing left inguinal hernia. No free air or free fluid. Musculoskeletal: No acute or significant osseous findings. IMPRESSION: 1. There is poor distention of the distal transverse colon, the descending colon, and proximal sigmoid colon which limits evaluation of these portions of the colon. Based on CT imaging, there is the possibility of a subtle early colitis which could be infectious, inflammatory, or ischemic. Recommend clinical correlation and follow-up as clinically warranted. 2. No  diverticulitis. 3. Atherosclerotic changes in the nonaneurysmal aorta. 4. Fat containing inguinal hernia. Electronically Signed   By: Dorise Bullion III M.D   On: 10/13/2017 19:00    Procedures Procedures (including critical care time)  Medications Ordered in ED Medications  sodium chloride 0.9 % bolus 1,000 mL (1,000 mLs Intravenous New Bag/Given 10/15/17 1300)     Initial Impression / Assessment and Plan / ED Course  I have reviewed the triage vital signs and the nursing notes.  Pertinent labs & imaging results that were available during my care of the patient were reviewed by me and considered in my medical decision making (see chart for details).     Patient with maroon-colored rectal bleeding.  Gastroenterology has been consulted and they think the patient may have ischemic colitis.  He will be admitted to medicine with GI following  Final Clinical Impressions(s) / ED Diagnoses   Final diagnoses:  Rectal bleeding    ED Discharge Orders    None       Milton Ferguson, MD 10/15/17 [phone]

## 2017-10-15 NOTE — Telephone Encounter (Signed)
Pt wife contacted office. Wife states that she took patient to [facility] ER on Saturday for abdominal pain. Pt states the ER said it may be colitis but was not positive. Wife states the ER said if patient was not getting better to follow up with provider. Pt wife stated that he is still bleeding and has not eaten and has not gotten out of bed. Pt states no fever or chills. Nurse advised to take patient to [facility] ER in [place] for evaluation. Pts wife verbalized understanding.

## 2017-10-15 NOTE — Consult Note (Addendum)
[facility] Consult: 12:56 PM 10/15/2017  LOS: 0 days    Referring Provider: Dr Roderic Palau in ED  Primary Care Physician:  Kathyrn Drown, MD Primary Gastroenterologist:  Dr Fuller Plan.     Reason for Consultation:  Hematochezia and left abd pain.     HPI: [name] is a 62 y.o. male.  PMH CAD, cardiac stenting 10/2012, on Plavix foar a while but none for > 2 years.  Bradycardia, 2:1 heart block and CHB, s/p pacemaker 12/2012.   Previous colonoscopies and EGDs, latest:  01/2012 EGD.  For chest, epigastric pain.  Gastroduodenitis.  Path: chronic inflammation, no H Pylori.   03/2016 Colonoscopy.  Surveillance for hx adenomatous polyps (2011).  4 sessile polyps, 3 to 7 mm in size (1 was leimyoma, the others were adenomas without HGD.  Internal rrhoids.  Few sigmoid tics.    Saturday afternoon, 3 d ago, he developed intense left lower abdominal pain and began first of several episodes of passing mucoid, bloody stools.  About 2 hours into the events, he went to the ED at [facility].   Blood pressure 188/102, pulse of 60.  No fever. 10/13/17 CT ab/pelvis with contrast Poor distention of the distal transverse, descending and proximal sigmoid colon,  which limits evaluation of these portions. Minimal increased attenuation in the pericolonic fat in a few locations and the surface of the colon in these regions is mildly irregular. While not determinant, the findings are at least somewhat concerning for mild colitis Possibility of a subtle early colitis which could be infectious,inflammatory, or ischemic. Recommend clinical correlation and follow-up as clinically warranted.  No diverticulitis.  Atherosclerotic changes in the nonaneurysmal aorta.  Fat containing inguinal hernia. Labs were obtained.  Given the reassuring  Hgb and the lack of other complaints, patient was not prescribed antibiotics and discharged with suggestion to follow-up with GI.  Actually patient was thinking he was given a follow-up with his primary first.  The symptoms have persisted through today, Monday afternoon.  He continued to have bloody stools though the volume of blood in the frequency of these diminished within the last 12 hours, though he had small bloody stools as recently as this morning.  The pain in his left lower quadrant persists. No hypotension or tachycardia.    Patient also provides history of, initially intermittent, nausea without vomiting.  No particular triggers to this including p.o. intake.  The episodes of nausea have become more frequent.  They may last anywhere from 10 minutes to a few hours.  They are not not associated with increased events of reflux symptoms/heartburn.  PMD prescribed Zofran which does help.  This was added to chronic daily Nexium. Patient does not use NSAIDs, he takes 81 mg aspirin daily.  Although he has narcotics available to deal with degenerative disc disease and associated radiating pain and stinging neuropathy down his left leg, he does not use the narcotics very often.  Before he started bleeding patient had not had any altered BM pattern. No previous history of bloody stools.  Does  not drink alcohol to any significance.  No significant weight fluctuation. No family history of colon cancer, bleeding ulcers, anemia.  Below reflects labs of 6/29 compared with labs of this morning Hgb 15.4 >> 17. WBCs 12.6 >> 16.9.   t bili 1.5 >> 2.4, otherwise  Normal LFTs.      Past Medical History:  Diagnosis Date  . ADD (attention deficit disorder)    Rx-Adderall  . Anginal pain ([place])   . AV block, 3rd degree ([facility])   . Colon polyps 2011   tubular adenoma by excisional biopsy during colonoscopy  . Complete heart block ([place])   . Coronary artery disease   . Degenerative disc disease, cervical   .  GERD (gastroesophageal reflux disease)   . Hypertension   . Pacemaker   . Palpitations 2003   Holter in 2003: PACs and PVCs; negative stress nuclear test in 2005; right bundle branch block; echo in 2008-mild LVH, otherwise normal; 2008-negative stress nuclear test.  . Pneumonia   . Prostatitis    prostate calcifications by CT  . Sleep apnea    questionable diagnosis    Past Surgical History:  Procedure Laterality Date  . ANTERIOR CERVICAL DECOMP/DISCECTOMY FUSION    . Biventricular pacemaker upgrade/ system revision  04/02/14   removal of previous atrial and ventricular leads with placement of a new MDT Consulta CRT-P system at [facility] by Dr Violet Baldy  . COLONOSCOPY    . COLONOSCOPY W/ POLYPECTOMY  2011   tubular adenoma  . ESOPHAGOGASTRODUODENOSCOPY     Gastritis  . KNEE SURGERY     rt  . LEFT HEART CATHETERIZATION WITH CORONARY ANGIOGRAM N/A 10/17/2012   Procedure: LEFT HEART CATHETERIZATION WITH CORONARY ANGIOGRAM;  Surgeon: Josue Hector, MD;  Location: [facility] CATH LAB;  Service: Cardiovascular;  Laterality: N/A;  . LEFT HEART CATHETERIZATION WITH CORONARY ANGIOGRAM N/A 12/17/2012   Procedure: LEFT HEART CATHETERIZATION WITH CORONARY ANGIOGRAM;  Surgeon: Peter M Martinique, MD;  Location: [facility] CATH LAB;  Service: Cardiovascular;  Laterality: N/A;  . LEFT HEART CATHETERIZATION WITH CORONARY ANGIOGRAM N/A 06/11/2013   Procedure: LEFT HEART CATHETERIZATION WITH CORONARY ANGIOGRAM;  Surgeon: Wellington Hampshire, MD;  Location: [facility] CATH LAB;  Service: Cardiovascular;  Laterality: N/A;  . PACEMAKER INSERTION  12/17/12   MDT Adapta L implanted by Dr Rayann Heman for mobitz II second degree AV block  . PERCUTANEOUS CORONARY STENT INTERVENTION (PCI-S)  10/17/2012   Procedure: PERCUTANEOUS CORONARY STENT INTERVENTION (PCI-S);  Surgeon: Josue Hector, MD;  Location: [facility] CATH LAB;  Service: Cardiovascular;;  . PERMANENT PACEMAKER INSERTION N/A 12/17/2012   Procedure: PERMANENT PACEMAKER INSERTION;   Surgeon: Thompson Grayer, MD;  Location: [facility] CATH LAB;  Service: Cardiovascular;  Laterality: N/A;  . POLYPECTOMY    . UPPER GASTROINTESTINAL ENDOSCOPY      Prior to Admission medications   Medication Sig Start Date End Date Taking? Authorizing Provider  ALPRAZolam Duanne Moron) 0.5 MG tablet Take 1 tablet (0.5 mg total) by mouth 3 (three) times daily as needed for sleep or anxiety. 05/10/17   Kathyrn Drown, MD  amLODipine (NORVASC) 5 MG tablet Take 1 tablet (5 mg total) by mouth daily. 07/12/17   Kathyrn Drown, MD  aspirin 81 MG chewable tablet Chew 81 mg by mouth daily.    [provider]  cyclobenzaprine (FLEXERIL) 5 MG tablet Take 5 mg by mouth at bedtime as needed for muscle spasms.    [provider]  dicyclomine (BENTYL) 20 MG tablet Take 1  tablet (20 mg total) by mouth 3 (three) times daily as needed for spasms. 10/13/17   Davonna Belling, MD  esomeprazole (NEXIUM) 40 MG capsule Take 1 capsule (40 mg total) by mouth daily. 05/10/17   Kathyrn Drown, MD  hydrALAZINE (APRESOLINE) 50 MG tablet Take 50 mg by mouth 2 (two) times daily.    [provider]  HYDROcodone-acetaminophen (NORCO) 10-325 MG tablet Take 1 tablet by mouth every 6 (six) hours as needed for severe pain.    [provider]  losartan (COZAAR) 100 MG tablet Take 1 tablet (100 mg total) by mouth daily. 08/16/17   Kathyrn Drown, MD  nadolol (CORGARD) 80 MG tablet Take 1 tablet (80 mg total) by mouth daily. 12/01/16   Herminio Commons, MD  nitroGLYCERIN (NITROSTAT) 0.4 MG SL tablet Place 1 tablet (0.4 mg total) under the tongue every 5 (five) minutes as needed for chest pain (MAX 3 TABLETS). 06/01/15   Lendon Colonel, NP  ondansetron (ZOFRAN) 8 MG tablet Take 1 tablet (8 mg total) by mouth every 8 (eight) hours as needed for nausea. 10/02/17   Kathyrn Drown, MD  pregabalin (LYRICA) 25 MG capsule Take 1 capsule (25 mg total) by mouth 2 (two) times daily. 10/02/17   Kathyrn Drown, MD    rosuvastatin (CRESTOR) 20 MG tablet Take 1 tablet (20 mg total) by mouth daily. 05/10/17   Kathyrn Drown, MD  tamsulosin (FLOMAX) 0.4 MG CAPS capsule Take 2 capsules (0.8 mg total) by mouth daily. 11/28/16   Kathyrn Drown, MD    Scheduled Meds:  Infusions: . sodium chloride     PRN Meds:    Allergies as of 10/15/2017 - Review Complete 10/13/2017  Allergen Reaction Noted  . Morphine and related Itching and Rash 05/13/2013  . Lasix [furosemide]  04/20/2016  . Latex Rash 07/01/2009    Family History  Problem Relation Age of Onset  . Heart disease Mother   . Hypertension Mother        Carotid disease  . Prostate cancer Brother   . Breast cancer Maternal Aunt   . Colon cancer Neg Hx   . Colon polyps Neg Hx     Social History   Socioeconomic History  . Marital status: Married    Spouse name: Not on file  . Number of children: 3  . Years of education: Not on file  . Highest education level: Not on file  Occupational History  . Not on file  Social Needs  . Financial resource strain: Not on file  . Food insecurity:    Worry: Not on file    Inability: Not on file  . Transportation needs:    Medical: Not on file    Non-medical: Not on file  Tobacco Use  . Smoking status: Former Smoker    Packs/day: 2.50    Years: 40.00    Pack years: 100.00    Types: Cigarettes    Start date: 04/18/1967    Last attempt to quit: 12/20/2003    Years since quitting: 13.8  . Smokeless tobacco: Former Network engineer and Sexual Activity  . Alcohol use: Yes    Alcohol/week: 0.6 oz    Types: 1 Shots of liquor per week    Comment: occasional  . Drug use: Yes    Types: Marijuana    Comment: 30 + years ago - "crank, marjiuanna, ect."  . Sexual activity: Yes    Birth control/protection: Surgical  Lifestyle  .  Physical activity:    Days per week: Not on file    Minutes per session: Not on file  . Stress: Not on file  Relationships  . Social connections:    Talks on phone: Not on  file    Gets together: Not on file    Attends religious service: Not on file    Active member of club or organization: Not on file    Attends meetings of clubs or organizations: Not on file    Relationship status: Not on file  . Intimate partner violence:    Fear of current or ex partner: Not on file    Emotionally abused: Not on file    Physically abused: Not on file    Forced sexual activity: Not on file  Other Topics Concern  . Not on file  Social History Narrative  . Not on file    REVIEW OF SYSTEMS: Constitutional: Generally the patient has good energy.  Since he got ill over the weekend he feels sluggish, tired, spent the bulk of yesterday in bed ENT:  No nose bleeds Pulm: Denies shortness of breath or cough. CV:  No palpitations, no LE edema.  No chest pain GU:  No recent hematuria, no frequency.  GI:  Per HPI Heme: Denies unusual or excessive bleeding or bruising.  No personal history of anemia. Transfusions: No previous history of blood product transfusions. Neuro:  No headaches, no peripheral tingling or numbness Derm:  No itching, no rash or sores.  Endocrine:  No sweats or chills.  No polyuria or dysuria Immunization: Reviewed.   PHYSICAL EXAM: Vital signs in last 24 hours: Vitals:   10/15/17 1215  BP: 130/76  Pulse: 89  Resp: (!) 23  SpO2: 95%   Wt Readings from Last 3 Encounters:  10/15/17 223 lb (101.2 kg)  10/13/17 223 lb (101.2 kg)  10/02/17 223 lb (101.2 kg)    General: Pleasant, well-appearing WM.  Appears stated age. Head: No facial asymmetry or swelling.  No signs of head trauma. Eyes: No scleral icterus.  No conjunctival pallor. Ears: Not hard of hearing Nose: No congestion, no discharge Mouth: Tongue midline.  Good dental repair.  Oral mucosa pink, moist, clear. Neck: No JVD, no masses, no thyromegaly Lungs: Clear bilaterally.  No labored breathing or cough. Heart: RRR.  No MRG.  Pacemaker palpable in position left upper chest. Abdomen:  Not distended.  Bowel sounds active.  Tenderness in the left mid to lower quadrants and less so in the left upper quadrant.  Not tender on the right side of the abdomen.  No masses, HSM, hernias, bruits.   Rectal: Deferred Musc/Skeltl: No joint swelling, redness, deformity. Extremities: No CCE. Neurologic: Alert.  Oriented x3.  Good historian.  No tremors, no limb weakness.  No gross motor deficits. Skin: No sores, rashes, suspicious lesions. Nodes: No cervical adenopathy. Psych: Pleasant, cooperative, fluid speech.  Calm.  Intake/Output from previous day: No intake/output data recorded. Intake/Output this shift: No intake/output data recorded.  LAB RESULTS: Recent Labs    10/13/17 1729 10/15/17 1023  WBC 12.6* 16.9*  HGB 15.4 17.0  HCT 46.6 52.4*  PLT 173 234   BMET Lab Results  Component Value Date   NA 138 10/15/2017   NA 139 10/13/2017   NA 145 (H) 05/03/2017   K 4.2 10/15/2017   K 4.0 10/13/2017   K 4.9 05/03/2017   CL 103 10/15/2017   CL 104 10/13/2017   CL 102 05/03/2017   CO2  21 (L) 10/15/2017   CO2 28 10/13/2017   CO2 27 05/03/2017   GLUCOSE 113 (H) 10/15/2017   GLUCOSE 152 (H) 10/13/2017   GLUCOSE 112 (H) 05/03/2017   BUN 11 10/15/2017   BUN 20 10/13/2017   BUN 16 05/03/2017   CREATININE 1.08 10/15/2017   CREATININE 1.00 10/13/2017   CREATININE 1.00 07/03/2017   CALCIUM 9.3 10/15/2017   CALCIUM 9.1 10/13/2017   CALCIUM 10.1 05/03/2017   LFT Recent Labs    10/13/17 1729 10/15/17 1023  PROT 6.8 6.7  ALBUMIN 4.1 4.0  AST 25 21  ALT 37 23  ALKPHOS 57 52  BILITOT 1.5* 2.4*   PT/INR Lab Results  Component Value Date   INR 1.0 06/04/2013   INR 0.95 12/16/2012   INR 0.97 10/16/2012   Hepatitis Panel No results for input(s): HEPBSAG, HCVAB, HEPAIGM, HEPBIGM in the last 72 hours. C-Diff No components found for: CDIFF Lipase  No results found for: LIPASE  Drugs of Abuse  No results found for: LABOPIA, COCAINSCRNUR, LABBENZ, AMPHETMU, THCU,  LABBARB   RADIOLOGY STUDIES: Ct Abdomen Pelvis W Contrast  Result Date: 10/13/2017 CLINICAL DATA:  Rectal bleeding.  Lower abdominal pain. EXAM: CT ABDOMEN AND PELVIS WITH CONTRAST TECHNIQUE: Multidetector CT imaging of the abdomen and pelvis was performed using the standard protocol following bolus administration of intravenous contrast. CONTRAST:  194mL ISOVUE-300 IOPAMIDOL (ISOVUE-300) INJECTION 61% COMPARISON:  July 03, 2017 FINDINGS: Lower chest: Pacemaker leads noted. Lung bases otherwise unremarkable. Hepatobiliary: No focal liver abnormality is seen. No gallstones, gallbladder wall thickening, or biliary dilatation. Pancreas: Unremarkable. No pancreatic ductal dilatation or surrounding inflammatory changes. Spleen: Normal in size without focal abnormality. Adrenals/Urinary Tract: Adrenal glands are unremarkable. Kidneys are normal, without renal calculi, focal lesion, or hydronephrosis. Bladder is unremarkable. Stomach/Bowel: The stomach and small bowel are normal. There is poor distention of the distal transverse colon, the descending colon, and the proximal sigmoid colon which limits evaluation. There is minimal increased attenuation in the pericolonic fat in a few locations and the surface of the colon in these regions is mildly irregular. While not determinant, the findings are at least somewhat concerning for mild colitis. There are a few colonic diverticuli but no focal inflammation around the diverticuli to suggest diverticulitis. The more proximal colon is normal in appearance. The appendix is normal. Vascular/Lymphatic: There is atherosclerotic change in the nonaneurysmal aorta. Of note, the IMA enhances. No adenopathy. Reproductive: Prostate is unremarkable. Other: There is a fat containing left inguinal hernia. No free air or free fluid. Musculoskeletal: No acute or significant osseous findings. IMPRESSION: 1. There is poor distention of the distal transverse colon, the descending colon, and  proximal sigmoid colon which limits evaluation of these portions of the colon. Based on CT imaging, there is the possibility of a subtle early colitis which could be infectious, inflammatory, or ischemic. Recommend clinical correlation and follow-up as clinically warranted. 2. No diverticulitis. 3. Atherosclerotic changes in the nonaneurysmal aorta. 4. Fat containing inguinal hernia. Electronically Signed   By: Dorise Bullion III M.D   On: 10/13/2017 19:00        IMPRESSION:   *    Hematochezia, bloody mucoid stool, with left abdominal pain and suggestion of left-sided colitis per sub-optimal CT scan 3 days ago.  Suspect ischemic colitis. Prior colonoscopies with recurrent adenomatous colon polyps, mild sigmoid diverticulosis and internal hemorrhoids.  Given the degree of pain experienced by the patient, diverticular bleed is less suspect.  No evidence for diverticulitis which  normally does not cause bleeding.  *   History of gastroduodenitis 5.5 years ago.  Daily Nexium.  For the last several weeks, patient's had intermittent nausea that has become more frequent.  Does not lead to vomiting.  Has not had significant associated weight loss.    *   CAD.  Cardiac stent placed 2014.  Complete heart block, status post cardiac pacemaker    PLAN:     *   See below     Azucena Freed  10/15/2017, 12:56 PM Phone [phone]  ________________________________________________________________________  Velora Heckler GI MD note:  I personally examined the patient, reviewed the data and agree with the assessment and plan described above. Mild to moderate ischemic colitis that is likely due to new constipation that he has been battling for past 3 weeks since being on percocet for back pains.  No further testing is needed.  Focus on IV hydration, pain control, abx (will order cipro/flagyl for 5 day course including while in hosp), anti nausea (empiric, will start zofran 4mg  BID), clear liquids encourage PO  intake. He really is hoping to go home tomorrow, that seems very possible at this point. At discharge, he should start taking miralax one dose once daily for as long as he will be on percocets for his back pain.    Owens Loffler, MD [facility] Pager [phone]

## 2017-10-15 NOTE — H&P (Signed)
Date: 10/15/2017               Patient Name:  [name] MRN: [id number]  DOB: [birth year] Age / Sex: 62 y.o., male   PCP: Kathyrn Drown, MD         Medical Service: Internal Medicine Teaching Service         Attending Physician: Dr. Oval Linsey, MD    First Contact: Dr. Linna Hoff Pager: [phone]  Second Contact: Dr. Lorella Nimrod Pager: [phone]       After Hours (After 5p/  First Contact Pager: [phone]  weekends / holidays): Second Contact Pager: [phone]   Chief Complaint: Abdominal pain, Rectal bleeding  History of Present Illness: 62 y/o M with CAD s/p stent 2014, CHB s/p pacemaker 2014, HTN, colon adenomatous and sessile polyps and internal hemorroid, presented with rectal bleeding and abdominal pain.  Patient has had an acute onset sharp pain with severity of 07/10 at his left lower quadrant 2 days ago (11/12/17). Associated with maron rectal bleeding. The pain has radiated to the RLQ. He denies any fever but had some sweating. He denies any diarrhea. However has had constipation in last 3 weeks following starting Percocet for back pain. He had also some nausea but no vomiting in past few days. (He complains of chronic nausea that had been worsened in last 2 months.) No change in weight is mentioned. Has a decreased appetite and has not taken food since his symptoms started. No Hx of previous bleeding except some hematuria 2 months ago that improved with UTI Tx.   His last colonoscopy was on 2017 (He repeats every 5 years because Hx of polyps) which showed sessile adenomas polyps.  Patient went to ED at [facility] at 07/10, where abdominal Ct scan showed some mild colitis, internal fat containing inguinal hernia. No diverticulitis was noticed at CT scan, how ever the radiologist mentioned that the colon was not seen well. Having stable Hb and no other symptoms, patient was discharge with no AB and recommended to follow up with GI. How ever, having constant  hematochezia and pain, he came to [facility]. He does not have any appetite since his symptoms started. At ED, BP:153/75, PR:74    Meds:  Prior to Admission medications   Medication Sig Start Date End Date Taking? Authorizing Provider  ALPRAZolam Duanne Moron) 0.5 MG tablet Take 1 tablet (0.5 mg total) by mouth 3 (three) times daily as needed for sleep or anxiety. 05/10/17   Kathyrn Drown, MD  amLODipine (NORVASC) 5 MG tablet Take 1 tablet (5 mg total) by mouth daily. 07/12/17   Kathyrn Drown, MD  aspirin 81 MG chewable tablet Chew 81 mg by mouth daily.    [provider]  cyclobenzaprine (FLEXERIL) 5 MG tablet Take 5 mg by mouth at bedtime as needed for muscle spasms.    [provider]  dicyclomine (BENTYL) 20 MG tablet Take 1 tablet (20 mg total) by mouth 3 (three) times daily as needed for spasms. 10/13/17   Davonna Belling, MD  esomeprazole (NEXIUM) 40 MG capsule Take 1 capsule (40 mg total) by mouth daily. 05/10/17   Kathyrn Drown, MD  hydrALAZINE (APRESOLINE) 50 MG tablet Take 50 mg by mouth 2 (two) times daily.    [provider]  HYDROcodone-acetaminophen (NORCO) 10-325 MG tablet Take 1 tablet by mouth every 6 (six) hours as needed for severe pain.    [provider]  losartan (  COZAAR) 100 MG tablet Take 1 tablet (100 mg total) by mouth daily. 08/16/17   Kathyrn Drown, MD  nadolol (CORGARD) 80 MG tablet Take 1 tablet (80 mg total) by mouth daily. 12/01/16   Herminio Commons, MD  nitroGLYCERIN (NITROSTAT) 0.4 MG SL tablet Place 1 tablet (0.4 mg total) under the tongue every 5 (five) minutes as needed for chest pain (MAX 3 TABLETS). 06/01/15   Lendon Colonel, NP  ondansetron (ZOFRAN) 8 MG tablet Take 1 tablet (8 mg total) by mouth every 8 (eight) hours as needed for nausea. 10/02/17   Kathyrn Drown, MD  pregabalin (LYRICA) 25 MG capsule Take 1 capsule (25 mg total) by mouth 2 (two) times daily. 10/02/17    Kathyrn Drown, MD  rosuvastatin (CRESTOR) 20 MG tablet Take 1 tablet (20 mg total) by mouth daily. 05/10/17   Kathyrn Drown, MD  tamsulosin (FLOMAX) 0.4 MG CAPS capsule Take 2 capsules (0.8 mg total) by mouth daily. 11/28/16   Kathyrn Drown, MD       Allergies: Allergies as of 10/15/2017 - Review Complete 10/15/2017  Allergen Reaction Noted  . Morphine and related Itching and Rash 05/13/2013  . Lasix [furosemide]  04/20/2016  . Latex Rash 07/01/2009   Past Medical History:  Diagnosis Date  . ADD (attention deficit disorder)    Rx-Adderall  . Anginal pain ([place])   . AV block, 3rd degree (HCC)   . Colon polyps 2011   tubular adenoma by excisional biopsy during colonoscopy  . Complete heart block ([place])   . Coronary artery disease   . Degenerative disc disease, cervical   . Gastrointestinal bleeding 10/15/2017  . GERD (gastroesophageal reflux disease)   . Hypertension   . Pacemaker   . Palpitations 2003   Holter in 2003: PACs and PVCs; negative stress nuclear test in 2005; right bundle branch block; echo in 2008-mild LVH, otherwise normal; 2008-negative stress nuclear test.  . Pneumonia   . Prostatitis    prostate calcifications by CT  . Sleep apnea    questionable diagnosis   Past surgical hx: Pacemaker  Ant cervical decomp/discetcomy fusion Knee surgery LH catheterization  Family History:  Denies family Hx of colon cancer Heart disease in Mother HTN in mother  Social History:  Former smoker. Quit smoking +20 years ago. Smokes CBD Alcohol occasionally (1 beer every 2 weeks) Married and has   Review of Systems: A complete ROS was negative except as per HPI.   Physical Exam: Blood pressure (!) 173/95, pulse 69, temperature 98.4 F (36.9 C), temperature source Oral, resp. rate 18, height 6' (1.829 m), weight 219 lb (99.3 kg), SpO2 98 %.   Constitutional: He appears well-developed and well-nourished. No distress.  HENT:  Head: Normocephalic and  atraumatic. Schlerea is mildly pale.  Mouth/Throat: Oropharynx is clear and moist.  Cardiovascular: Normal rate.  III/VI systolic Murmur heard. at Aortic and Pulmonary area with no distention to neck. Pulmonary/Chest: Breathes normally, Breath sounds are normal.  Abdominal: Soft. Normal appearance and bowel sounds are normal. He exhibits no distension. There is tenderness in the left lower quadrant. There is rebound. tenderness.  Extremities: Normal, pulses are symmetric and normal and no edema is appreciated.    Assessment & Plan by Problem:  Active problems:   1. Lower GI bleeding: Acute new onset hematochezia since 2 days ago associated with pain in LLQ.     DDX:     1) Ischemic colitis: Maron hematochezia, feeling of urgency  for bowel movement. Probably in setting of                   recent constipation after starting Percocet.        GI is on board.       -Clear fluid diet      -IV fluid      -AB treatment (Cipro/flagyl x 5 days recommended by GI)      -Start Miralax at discharge as long as continuing Percocet for back pain       2)Internal hemorrohid bleeding: Prevent constipation. How ever, per patient, the recent episode of rectal                   bleeding which is more massive and maron, is different than his usual bright red hemorroid bleeding.      3)Diverticulitis: Mild diverticulosis Is less likely considering rectal bleeding in this patient.         And given the degree of pain the patient experienced, the diverticular bleed is less likely. Also Ct scan was            negative for diverticulitis  2. Nausea: In the setting of abdominal pain and chronic nausea. No vomiting is reported.    - Zofran 4 mg BID, Continue Nexium  Other Problems:    3. Chronic back pain: No acute pain now. Tylenol and Hydrocodone PRN. Take with Miralax to prevent constipation when restart Percocet at home after discharge.  4. CAD s/p Stent: Asymptomatic   -Continue ASA 81 mg daily    -Continue Crestor 20 mg QD  5. HTN:    -Continue Amlodipine 5 mg QD   6. CHB: s/p Psychologist, forensic. Asymptomatic     7-BPH:   -Continue Tamsulosin: 0.4 mg x2 PO QD  Dispo: Admit patient to Inpatient with expected length of stay greater than 2 midnights.  Signed: Dewayne Hatch, MD 10/15/2017, 6:44 PM  Pager: @[email or web address] @

## 2017-10-16 DIAGNOSIS — K559 Vascular disorder of intestine, unspecified: Secondary | ICD-10-CM

## 2017-10-16 LAB — CUP PACEART REMOTE DEVICE CHECK
Battery Remaining Longevity: 60 mo
Battery Voltage: 3 V
Brady Statistic AP VP Percent: 21.17 %
Brady Statistic AP VS Percent: 0 %
Brady Statistic AS VP Percent: 78.82 %
Brady Statistic AS VS Percent: 0 %
Brady Statistic RA Percent Paced: 21.17 %
Brady Statistic RV Percent Paced: 99.99 %
Date Time Interrogation Session: 20190612230714
Implantable Lead Implant Date: 20151217
Implantable Lead Implant Date: 20151217
Implantable Lead Implant Date: 20151217
Implantable Lead Location: 753858
Implantable Lead Location: 753859
Implantable Lead Location: 753860
Implantable Lead Model: 4196
Implantable Lead Model: 5076
Implantable Lead Model: 5076
Implantable Pulse Generator Implant Date: 20151217
Lead Channel Impedance Value: 380 Ohm
Lead Channel Impedance Value: 399 Ohm
Lead Channel Impedance Value: 418 Ohm
Lead Channel Impedance Value: 456 Ohm
Lead Channel Impedance Value: 494 Ohm
Lead Channel Impedance Value: 608 Ohm
Lead Channel Impedance Value: 627 Ohm
Lead Channel Impedance Value: 722 Ohm
Lead Channel Impedance Value: 969 Ohm
Lead Channel Pacing Threshold Amplitude: 0.75 V
Lead Channel Pacing Threshold Amplitude: 0.75 V
Lead Channel Pacing Threshold Amplitude: 1 V
Lead Channel Pacing Threshold Pulse Width: 0.4 ms
Lead Channel Pacing Threshold Pulse Width: 0.4 ms
Lead Channel Pacing Threshold Pulse Width: 0.4 ms
Lead Channel Sensing Intrinsic Amplitude: 0.625 mV
Lead Channel Sensing Intrinsic Amplitude: 0.625 mV
Lead Channel Sensing Intrinsic Amplitude: 23.75 mV
Lead Channel Sensing Intrinsic Amplitude: 23.75 mV
Lead Channel Setting Pacing Amplitude: 2 V
Lead Channel Setting Pacing Amplitude: 2.25 V
Lead Channel Setting Pacing Amplitude: 2.5 V
Lead Channel Setting Pacing Pulse Width: 0.4 ms
Lead Channel Setting Pacing Pulse Width: 0.4 ms
Lead Channel Setting Sensing Sensitivity: 0.9 mV

## 2017-10-16 LAB — CBC
HCT: 43.1 % (ref 39.0–52.0)
Hemoglobin: 13.7 g/dL (ref 13.0–17.0)
MCH: 29.5 pg (ref 26.0–34.0)
MCHC: 31.8 g/dL (ref 30.0–36.0)
MCV: 92.7 fL (ref 78.0–100.0)
Platelets: 125 10*3/uL — ABNORMAL LOW (ref 150–400)
RBC: 4.65 MIL/uL (ref 4.22–5.81)
RDW: 13.6 % (ref 11.5–15.5)
WBC: 11 10*3/uL — ABNORMAL HIGH (ref 4.0–10.5)

## 2017-10-16 LAB — BASIC METABOLIC PANEL
Anion gap: 9 (ref 5–15)
BUN: 11 mg/dL (ref 8–23)
CO2: 24 mmol/L (ref 22–32)
Calcium: 8.3 mg/dL — ABNORMAL LOW (ref 8.9–10.3)
Chloride: 105 mmol/L (ref 98–111)
Creatinine, Ser: 1.03 mg/dL (ref 0.61–1.24)
GFR calc Af Amer: >60 mL/min (ref >60)
GFR calc non Af Amer: >60 mL/min (ref >60)
Glucose, Bld: 94 mg/dL (ref 70–99)
Potassium: 4.1 mmol/L (ref 3.5–5.1)
Sodium: 138 mmol/L (ref 135–145)

## 2017-10-16 LAB — HIV ANTIBODY (ROUTINE TESTING W REFLEX): HIV Screen 4th Generation wRfx: NONREACTIVE

## 2017-10-16 MED ORDER — SENNA 8.6 MG PO TABS
1.0000 | ORAL_TABLET | Freq: Every day | ORAL | Status: DC
  Administered 2017-10-16: 8.6 mg via ORAL
  Filled 2017-10-16: qty 1

## 2017-10-16 MED ORDER — DOCUSATE SODIUM 100 MG PO CAPS
100.0000 mg | ORAL_CAPSULE | Freq: Every day | ORAL | Status: DC
  Administered 2017-10-16: 100 mg via ORAL
  Filled 2017-10-16: qty 1

## 2017-10-16 MED ORDER — HYDROMORPHONE HCL 1 MG/ML IJ SOLN
1.0000 mg | INTRAMUSCULAR | Status: DC | PRN
  Administered 2017-10-16: 0.5 mg via INTRAVENOUS
  Filled 2017-10-16: qty 1

## 2017-10-16 MED ORDER — SODIUM CHLORIDE 0.9 % IV SOLN: INTRAVENOUS | Status: AC

## 2017-10-16 MED ORDER — NADOLOL 80 MG PO TABS
80.0000 mg | ORAL_TABLET | Freq: Every day | ORAL | Status: DC
  Administered 2017-10-16: 80 mg via ORAL
  Filled 2017-10-16 (×2): qty 1

## 2017-10-16 MED ORDER — SODIUM CHLORIDE 0.9 % IV SOLN
INTRAVENOUS | Status: DC
  Administered 2017-10-16 – 2017-10-17 (×2): via INTRAVENOUS

## 2017-10-16 NOTE — Progress Notes (Signed)
   Subjective: Visited the patient with our team. His rectal bleeding has been less frequent and less in amount. How ever he complains about sharp, moderate to sever localized abdominal pain at his LLQ, that stated after he had breakfast today. He tries not to move because it makes the pain worse. Denies vomiting and diarrhea.  We checked with the patient again, about any possibility of drinking contaminated water .He denies that and mentined does not use well water. We discuss the plan of care with patient and his wife and he agrees to stay at hospital today for fluid resuscitation and evaluation.   I visited the patient at 5 PM today again, and the abdominal pain was resolved. Patient had not eaten his lunch but having less pain, he is waiting for dinner. He reports one loose bowel movement this afternoon with less amount of blood with it. He generally feels better comparing to this AM.  Objective:  Vital signs in last 24 hours: Vitals:   10/16/17 0550 10/16/17 0815 10/16/17 1423 10/16/17 1755  BP: (!) 149/75  (!) 148/80   Pulse: 66  61   Resp: 18  16   Temp: 98.2 F (36.8 C)  98.1 F (36.7 C)   TempSrc: Oral  Oral   SpO2: 97% 96% 97% 98%  Weight:      Height:       *Orthostatic BP this AM: Supine: 161/82  >>> Sitting: 130/80   PH/E: General: Pleasant alert and oriented gentleman, in moderate pain with movement. Cardiac: Systolic III/VI murmur at A and P area. Lungs: CTA bilaterally Abdomen: Mild distention, BS is nl. Abdomen is soft. Tenderness at LLQ. No rebound tenderness. Ext: No LE edema   Assessment/Plan:  Active problems:  1)Hematochezia: Probably due to ischemic colitis. Is on ABx. Currently less frequent and milder.  Hb:13.7,  Hct: 43  Orthostatic hypotension at this AM   -Na Cl 240ml/h infusion for 5 h then 100 ml/h. - Continue Cipro 500 PO BID and Flagyl 500 mg PO BID for 4 more days.  Other problems:  2)CAD s/p stent 3)CHB s/p Pacemaker 4)HTN:    -Continue Amlodipine 5 mg QD -Restart home Nadolol 80 mg QD  2) Abdominal Pain:  Experienced the pain after meal this AM,. Although the clinical feature favors diverticulitis>ischemic colitis, presenting with hematochezia, no fever and no diverticulitis at abdominal CT scan on 10/13/2017 made diverticulitis less likely. (At PM visit today, the pain was improved).  - We monitor the patient today and evaluate his food tolerance. - Check CBC and BMP tomorrow - Dilaudid 1 mg PRN q 3h for pain  Dispo: Anticipated discharge in approximately 1-2 day(s)   Dewayne Hatch, MD 10/16/2017, 6:16 PM Pager: @[email or web address] @

## 2017-10-16 NOTE — Progress Notes (Signed)
[facility] Progress Note    Since last GI note: Several small stools with red blood overnight. Most recent was more brown, less blood.  Tolerating clear liquids.  He is very eager to go home.  Objective: Vital signs in last 24 hours: Temp:  [98.2 F (36.8 C)-98.6 F (37 C)] 98.2 F (36.8 C) (07/02 0550) Pulse Rate:  [66-89] 66 (07/02 0550) Resp:  [17-23] 18 (07/02 0550) BP: (130-174)/(75-95) 149/75 (07/02 0550) SpO2:  [91 %-98 %] 96 % (07/02 0815) Weight:  [219 lb (99.3 kg)-223 lb (101.2 kg)] 219 lb (99.3 kg) (07/01 1525) Last BM Date: 10/16/17 General: alert and oriented times 3 Heart: regular rate and rythm Abdomen: soft, mildly tender throughout, non-distended, normal bowel sounds   Lab Results: Recent Labs    10/15/17 1023 10/15/17 1513 10/16/17 0537  WBC 16.9* 14.6* 11.0*  HGB 17.0 16.3 13.7  PLT 234 172 125*  MCV 92.3 91.3 92.7   Recent Labs    10/13/17 1729 10/15/17 1023 10/16/17 0537  NA 139 138 138  K 4.0 4.2 4.1  CL 104 103 105  CO2 28 21* 24  GLUCOSE 152* 113* 94  BUN 20 11 11   CREATININE 1.00 1.08 1.03  CALCIUM 9.1 9.3 8.3*   Recent Labs    10/13/17 1729 10/15/17 1023  PROT 6.8 6.7  ALBUMIN 4.1 4.0  AST 25 21  ALT 37 23  ALKPHOS 57 52  BILITOT 1.5* 2.4*     Medications: Scheduled Meds: . amLODipine  5 mg Oral Daily  . aspirin  81 mg Oral Daily  . ciprofloxacin  500 mg Oral BID  . metroNIDAZOLE  500 mg Oral Q12H  . ondansetron  4 mg Oral Q12H  . tamsulosin  0.8 mg Oral Daily   Continuous Infusions: . sodium chloride 100 mL/hr at 10/16/17 0005   PRN Meds:.acetaminophen **OR** acetaminophen, ALPRAZolam, HYDROmorphone (DILAUDID) injection    Assessment/Plan: 62 y.o. male with (presumed) Mild to moderate ischemic colitis.   He needs to complete 4 more days of antibiotics (oral cipro/flagyl).  He should start taking miralax one dose once daily and continue it for as long as he requires narcotic pain meds for his  back.  I recommended liquid diet for another few days until he feels ready to advance.  OK to d/c today. He knows to call the office if fevers or pain worsens significantly or if bleeding dramatically increases.    Milus Banister, MD  10/16/2017, 9:35 AM [facility] Pager [phone]

## 2017-10-16 NOTE — Plan of Care (Signed)

## 2017-10-17 DIAGNOSIS — K559 Vascular disorder of intestine, unspecified: Principal | ICD-10-CM

## 2017-10-17 LAB — CBC
HCT: 45.7 % (ref 39.0–52.0)
Hemoglobin: 14.5 g/dL (ref 13.0–17.0)
MCH: 29.2 pg (ref 26.0–34.0)
MCHC: 31.7 g/dL (ref 30.0–36.0)
MCV: 92 fL (ref 78.0–100.0)
Platelets: 163 10*3/uL (ref 150–400)
RBC: 4.97 MIL/uL (ref 4.22–5.81)
RDW: 13.4 % (ref 11.5–15.5)
WBC: 8.9 10*3/uL (ref 4.0–10.5)

## 2017-10-17 MED ORDER — METRONIDAZOLE 500 MG PO TABS: 500.0000 mg | ORAL_TABLET | Freq: Two times a day (BID) | ORAL | 0 refills | Status: AC

## 2017-10-17 MED ORDER — CIPROFLOXACIN HCL 500 MG PO TABS: 500.0000 mg | ORAL_TABLET | Freq: Two times a day (BID) | ORAL | 0 refills | Status: AC

## 2017-10-17 MED ORDER — DOCUSATE SODIUM 100 MG PO CAPS: 100.0000 mg | ORAL_CAPSULE | Freq: Every day | ORAL | 0 refills | Status: DC

## 2017-10-17 NOTE — Discharge Summary (Addendum)
Name: [name] MRN: [id number] DOB: 1955/09/07 62 y.o. PCP: [name] Drown, MD  Date of Admission: 10/15/2017  9:51 AM Date of Discharge:  Attending Physician: [name] Linsey, MD  Discharge Diagnosis: 1.Ischemic colitis secondary to constipation  Discharge Medications: Allergies as of 10/17/2017      Reactions   Morphine And Related Itching, Rash   redness   Lasix [furosemide]    Chest pain and tightness   Latex Rash      Medication List    TAKE these medications   ALPRAZolam 0.5 MG tablet Commonly known as:  XANAX Take 1 tablet (0.5 mg total) by mouth 3 (three) times daily as needed for sleep or anxiety.   amLODipine 5 MG tablet Commonly known as:  NORVASC Take 1 tablet (5 mg total) by mouth daily.   aspirin 81 MG chewable tablet Chew 81 mg by mouth daily.   ciprofloxacin 500 MG tablet Commonly known as:  CIPRO Take 1 tablet (500 mg total) by mouth 2 (two) times daily for 3 days.   cyclobenzaprine 5 MG tablet Commonly known as:  FLEXERIL Take 5 mg by mouth at bedtime as needed for muscle spasms.   dicyclomine 20 MG tablet Commonly known as:  BENTYL Take 1 tablet (20 mg total) by mouth 3 (three) times daily as needed for spasms.   docusate sodium 100 MG capsule Commonly known as:  COLACE Take 1 capsule (100 mg total) by mouth daily.   esomeprazole 40 MG capsule Commonly known as:  NEXIUM Take 1 capsule (40 mg total) by mouth daily.   hydrALAZINE 50 MG tablet Commonly known as:  APRESOLINE Take 50 mg by mouth 2 (two) times daily.   HYDROcodone-acetaminophen 10-325 MG tablet Commonly known as:  NORCO Take 1 tablet by mouth every 6 (six) hours as needed for severe pain.   losartan 100 MG tablet Commonly known as:  COZAAR Take 1 tablet (100 mg total) by mouth daily.   metroNIDAZOLE 500 MG tablet Commonly known as:  FLAGYL Take 1 tablet (500 mg total) by mouth every 12 (twelve) hours for 3 days.   nadolol 80 MG tablet Commonly known as:   CORGARD Take 1 tablet (80 mg total) by mouth daily.   nitroGLYCERIN 0.4 MG SL tablet Commonly known as:  NITROSTAT Place 1 tablet (0.4 mg total) under the tongue every 5 (five) minutes as needed for chest pain (MAX 3 TABLETS).   ondansetron 8 MG tablet Commonly known as:  ZOFRAN Take 1 tablet (8 mg total) by mouth every 8 (eight) hours as needed for nausea.   pregabalin 25 MG capsule Commonly known as:  LYRICA Take 1 capsule (25 mg total) by mouth 2 (two) times daily.   rosuvastatin 20 MG tablet Commonly known as:  CRESTOR Take 1 tablet (20 mg total) by mouth daily.   tamsulosin 0.4 MG Caps capsule Commonly known as:  FLOMAX Take 2 capsules (0.8 mg total) by mouth daily.       Disposition and follow-up:   [name] was discharged from [facility] in Stable condition. At the hospital follow up visit please address:  1.  Ischemic colitis Make sure patient did not have any rectal bleeding at this point  2.  Labs / imaging needed at time of follow-up: CBC 3.  Pending labs/ test needing follow-up: None  Follow-up Appointments: -Follow up with gasteroenthrology   Hospital Course by problem list: 1. Hematochezia and abdominal pain  [name] is a  62 year old man with a history of CAD s/p percutaneous coronary intervention, CHB s/p pacemaker, chronic low back pain on PRN Percocet, and adenomatous colonic polyps who was in his usual state of health before admission. When he developed the acute onset of severe left lower abdominal pain associated with the passing of jellylike maroon stools. He initially presented to the [facility] emergency department where a CT scan showed possible colitis and no sign of diverticulitis. The patient was discharged home, but with continued bleeding presented to the [facility] emergency department.  He was seen by gastroenterology who was concerned about a possible ischemic colitis in setting of recent constipation. He was  admitted to the internal medicine teaching service and as per recommendation of GI service, he received Ciprofloxacin 500 mg PO, BID and Flagyl 500 mg, PO, BID as well as IV fluid and clear diet. The patient has been hemodynamically stable and renal and basic metabolic panel has been within normal visit during hospitalization.  The patient had one episode of acute sharp abdominal pain the second day of admission after having his meal wihich resolved within half of day. He does not report more episode hematochezia. Has tolerated liquid diet and soft food with no pain and has had normal to loose bowel movement without rectal bleeding.   Discharge Vitals:   BP 117/82 (BP Location: Left Arm)   Pulse 64   Temp 97.9 F (36.6 C) (Oral)   Resp 18   Ht 6' (1.829 m)   Wt 219 lb (99.3 kg)   SpO2 95%   BMI 29.70 kg/m   Pertinent Labs, Studies, and Procedures:     Ref Range & Units 10:39  WBC 4.0 - 10.5 K/uL 8.9   RBC 4.22 - 5.81 MIL/uL 4.97   Hemoglobin 13.0 - 17.0 g/dL 14.5   HCT 39.0 - 52.0 % 45.7   MCV 78.0 - 100.0 fL 92.0   MCH 26.0 - 34.0 pg 29.2   MCHC 30.0 - 36.0 g/dL 31.7   RDW 11.5 - 15.5 % 13.4   Platelets 150 - 400 K/uL 163   Comment: Performed at [facility], [place] [address], [place], [place] [address]  Resulting Agency  [facility]      Specimen Collected: 10/17/17 10:39 Last Resulted: 10/17/17 11:09        Ref Range & Units 1d ago   Sodium 135 - 145 mmol/L 138   Potassium 3.5 - 5.1 mmol/L 4.1   Chloride 98 - 111 mmol/L 105   Comment: Please note change in reference range.  CO2 22 - 32 mmol/L 24   Glucose, Bld 70 - 99 mg/dL 94   Comment: Please note change in reference range.  BUN 8 - 23 mg/dL 11   Comment: Please note change in reference range.  Creatinine, Ser 0.61 - 1.24 mg/dL 1.03   Calcium 8.9 - 10.3 mg/dL 8.3Low    GFR calc non Af Amer >60 mL/min >60   GFR calc Af Amer >60 mL/min >60      Discharge Instructions: Discharge Instructions    Call MD  for:  persistant nausea and vomiting   Complete by:  As directed    Call MD for:  severe uncontrolled pain   Complete by:  As directed    Call MD for:  temperature >100.4   Complete by:  As directed    Diet - low sodium heart healthy   Complete by:  As directed    Discharge instructions  Complete by:  As directed    It was pleasure taking care of you. As we discussed please continue taking antibiotics for another 3 days. You will continue taking liquid diet for the next few days, you can advance it as tolerated after that. Please follow-up with your primary care physician within a week. Please follow-up with gastroenterology as directed.   Increase activity slowly   Complete by:  As directed      Specimen Collected: 10/16/17 05:37 Last Resulted: 10/16/17 06:56    Abdomen CT Scan with contrast at [place]. (06.29.2019  IMPRESSION: 1. There is poor distention of the distal transverse colon, the descending colon, and proximal sigmoid colon which limits evaluation of these portions of the colon. Based on CT imaging, there is the possibility of a subtle early colitis which could be infectious, inflammatory, or ischemic. Recommend clinical correlation and follow-up as clinically warranted. 2. No diverticulitis. 3. Atherosclerotic changes in the nonaneurysmal aorta. 4. Fat containing inguinal hernia.    Signed: Dewayne Hatch, MD 10/17/2017, 11:06 AM   Pager: @MYPAGER @

## 2017-10-17 NOTE — Progress Notes (Signed)
   Subjective: When I visited Mr. Peart this morning. He feels great. He did not have any episode of abdominal pain since yesterday. He has tolerated liquid and soft food well. Has had some loose bowel movement with no blood with it. Denies any dizziness.  Objective:  Vital signs in last 24 hours: Vitals:   10/16/17 1423 10/16/17 1755 10/16/17 2143 10/17/17 0644  BP: (!) 148/80  (!) 146/94 117/82  Pulse: 61  63 64  Resp: 16  18 18   Temp: 98.1 F (36.7 C)  98.2 F (36.8 C) 97.9 F (36.6 C)  TempSrc: Oral  Oral Oral  SpO2: 97% 98% 100% 95%  Weight:      Height:       General: Alert and oriented, in no acute distress. Sitting comfortably at chair. Cardiac: Systolic III/VI murmur at A and P area.  Lungs: CTA with normal breath sounds Abdomen: Normal BS. Abdomen  is soft, mild tenderness in LLQ still present Extremities: Normal capillary refill, NL bilateral pulses, No LE edema   Assessment/Plan:  Active problems:  1) Abdominal Pain and hematocheiza  Resolved since yesterday evening. Probably due to ischemic colitis. Has been on ABx since admission (10/15/2017).  Hemonydamicaly stable. Hb:13.7 Hct:43  -Discharge today with Cipro 500 PO BID and Flagyl 500 mg PO BID at home for 3 more and Miralax.   -Avoid constipation  Other problems 2)CAD s/p stent 3)CHB s/p Pacemaker 4)HTN Stable and asymptomatic -Continue home medications as before  Dispo: Anticipated discharge: today  Dewayne Hatch, MD 10/17/2017, 11:05 AM Pager: @MYPAGER @

## 2017-10-23 ENCOUNTER — Other Ambulatory Visit (HOSPITAL_COMMUNITY): Payer: Self-pay | Admitting: Neurological Surgery

## 2017-10-23 DIAGNOSIS — M5412 Radiculopathy, cervical region: Secondary | ICD-10-CM

## 2017-10-23 DIAGNOSIS — M4726 Other spondylosis with radiculopathy, lumbar region: Secondary | ICD-10-CM

## 2017-10-30 ENCOUNTER — Ambulatory Visit: Payer: BLUE CROSS/BLUE SHIELD | Admitting: Family Medicine

## 2017-11-06 ENCOUNTER — Other Ambulatory Visit: Payer: Self-pay | Admitting: Family Medicine

## 2017-11-06 ENCOUNTER — Ambulatory Visit: Payer: BLUE CROSS/BLUE SHIELD | Admitting: Urology

## 2017-11-08 ENCOUNTER — Encounter: Payer: Self-pay | Admitting: Family Medicine

## 2017-11-08 NOTE — Telephone Encounter (Signed)
Nurses please call patient his last prescription was for hydrocodone as best I can tell he is asking for Percocet this is different please clarify then send me a telephone message regarding this

## 2017-11-09 MED ORDER — OXYCODONE-ACETAMINOPHEN 10-325 MG PO TABS: 1.0000 | ORAL_TABLET | ORAL | 0 refills | Status: AC | PRN

## 2017-11-09 NOTE — Telephone Encounter (Signed)
It would be fine to go ahead and do a refill of this medicine 1 every 4 hours as needed severe pain Percocet 10 mg / 325 mg, #30

## 2017-11-09 NOTE — Addendum Note (Signed)
Addended by: Dairl Ponder on: 11/09/2017 02:03 PM   Modules accepted: Orders

## 2017-11-15 ENCOUNTER — Other Ambulatory Visit: Payer: Self-pay

## 2017-11-15 ENCOUNTER — Ambulatory Visit (HOSPITAL_COMMUNITY)
Admission: RE | Admit: 2017-11-15 | Discharge: 2017-11-15 | Disposition: A | Discharge status: Home / Self Care | Payer: BLUE CROSS/BLUE SHIELD | Source: Ambulatory Visit | Attending: Neurological Surgery | Admitting: Neurological Surgery

## 2017-11-15 DIAGNOSIS — M5030 Other cervical disc degeneration, unspecified cervical region: Secondary | ICD-10-CM | POA: Insufficient documentation

## 2017-11-15 DIAGNOSIS — M5412 Radiculopathy, cervical region: Secondary | ICD-10-CM

## 2017-11-15 DIAGNOSIS — M4716 Other spondylosis with myelopathy, lumbar region: Secondary | ICD-10-CM | POA: Diagnosis not present

## 2017-11-15 DIAGNOSIS — M4712 Other spondylosis with myelopathy, cervical region: Secondary | ICD-10-CM | POA: Diagnosis not present

## 2017-11-15 DIAGNOSIS — M4802 Spinal stenosis, cervical region: Secondary | ICD-10-CM | POA: Diagnosis not present

## 2017-11-15 DIAGNOSIS — M4726 Other spondylosis with radiculopathy, lumbar region: Secondary | ICD-10-CM

## 2017-11-15 DIAGNOSIS — M47812 Spondylosis without myelopathy or radiculopathy, cervical region: Secondary | ICD-10-CM | POA: Diagnosis not present

## 2017-11-15 DIAGNOSIS — M5127 Other intervertebral disc displacement, lumbosacral region: Secondary | ICD-10-CM | POA: Diagnosis not present

## 2017-11-15 MED ORDER — IOPAMIDOL (ISOVUE-M 300) INJECTION 61%
15.0000 mL | Freq: Once | INTRAMUSCULAR | Status: AC | PRN
  Administered 2017-11-15: 10 mL via INTRATHECAL

## 2017-11-15 MED ORDER — ONDANSETRON HCL 4 MG/2ML IJ SOLN: 4.0000 mg | Freq: Four times a day (QID) | INTRAMUSCULAR | Status: DC | PRN

## 2017-11-15 MED ORDER — IOPAMIDOL (ISOVUE-M 300) INJECTION 61%
INTRAMUSCULAR | Status: AC
  Administered 2017-11-15: 10 mL via INTRATHECAL
  Filled 2017-11-15: qty 15

## 2017-11-15 MED ORDER — OXYCODONE-ACETAMINOPHEN 5-325 MG PO TABS
1.0000 | ORAL_TABLET | ORAL | Status: DC | PRN
  Administered 2017-11-15: 1 via ORAL

## 2017-11-15 MED ORDER — LIDOCAINE HCL (PF) 1 % IJ SOLN
5.0000 mL | Freq: Once | INTRAMUSCULAR | Status: AC
  Administered 2017-11-15: 2 mL via INTRADERMAL

## 2017-11-15 MED ORDER — DIAZEPAM 5 MG PO TABS
ORAL_TABLET | ORAL | Status: AC
  Administered 2017-11-15: 10 mg via ORAL
  Filled 2017-11-15: qty 2

## 2017-11-15 MED ORDER — OXYCODONE-ACETAMINOPHEN 5-325 MG PO TABS
ORAL_TABLET | ORAL | Status: AC
  Filled 2017-11-15: qty 2

## 2017-11-15 MED ORDER — DIAZEPAM 5 MG PO TABS
10.0000 mg | ORAL_TABLET | Freq: Once | ORAL | Status: AC
  Administered 2017-11-15: 10 mg via ORAL

## 2017-11-15 NOTE — Discharge Instructions (Signed)
Myelogram, Care After °Refer to this sheet in the next few weeks. These instructions provide you with information about caring for yourself after your procedure. Your health care provider may also give you more specific instructions. Your treatment has been planned according to current medical practices, but problems sometimes occur. Call your health care provider if you have any problems or questions after your procedure. °What can I expect after the procedure? °After the procedure, it is common to have: °· Soreness at your injection site. °· A mild headache. ° °Follow these instructions at home: °· Drink enough fluid to keep your urine clear or pale yellow. This will help flush out the dye (contrast material) from your spine. °· Rest as told by your health care provider. Lie flat with your head slightly raised (elevated) to reduce the risk of headache. °· Do not bend, lift, or do any strenuous activity for 24-48 hours or as told by your health care provider. °· Take over-the-counter and prescription medicines only as told by your health care provider. °· Take care of and remove your bandage (dressing) as told by your health care provider. °· Bathe or shower as told by your health care provider. °Contact a health care provider if: °· You have a fever. °· You have a headache that lasts longer than 24 hours. °· You feel nauseous or vomit. °· You have a stiff neck or numbness in your legs. °· You are unable to urinate or have a bowel movement. °· You develop a rash, itching, or sneezing. °Get help right away if: °· You have new symptoms or your symptoms get worse. °· You have a seizure. °· You have trouble breathing. °This information is not intended to replace advice given to you by your health care provider. Make sure you discuss any questions you have with your health care provider. °Document Released: 04/30/2015 Document Revised: 09/09/2015 Document Reviewed: 01/14/2015 °Elsevier Interactive Patient Education ©  2018 [employer] ° °

## 2017-11-15 NOTE — Progress Notes (Signed)
Pt here for procedure, no orders in chart. Dr Ellene Route was called, orders followed.

## 2017-11-15 NOTE — Procedures (Signed)
[name] is a 62 year old who has had significant difficulties with back and left buttock hip and leg pain he also has had a previous anterior cervical decompression arthrodesis.  This was at the C6-C7 level.  He has had neck shoulder and left arm pain and has evidence of spondylosis at the adjacent levels particularly above his fusion.  He has an implanted pacemaker which prevents performance of an MRI.  Therefore he is now to undergo a total myelogram for further evaluation of this process.  Pre op Dx: Lumbar radiculopathy, cervical myelopathy Post op Dx: Same Procedure: Total myelogram Surgeon: Ellene Route Puncture level: L3-4 Fluid color: Clear colorless Injection: Isovue-300, 10 mL Findings: Severe spondylosis C4-5 C5-6, left-sided nerve root cut off L3-4, further evaluation with CT scanning.

## 2017-11-19 ENCOUNTER — Other Ambulatory Visit: Payer: Self-pay | Admitting: Neurological Surgery

## 2017-11-19 ENCOUNTER — Ambulatory Visit
Admission: RE | Admit: 2017-11-19 | Discharge: 2017-11-19 | Disposition: A | Discharge status: Home / Self Care | Payer: BLUE CROSS/BLUE SHIELD | Source: Ambulatory Visit | Attending: Neurological Surgery | Admitting: Neurological Surgery

## 2017-11-19 DIAGNOSIS — M4726 Other spondylosis with radiculopathy, lumbar region: Secondary | ICD-10-CM

## 2017-11-19 DIAGNOSIS — G971 Other reaction to spinal and lumbar puncture: Secondary | ICD-10-CM | POA: Diagnosis not present

## 2017-11-19 MED ORDER — IOPAMIDOL (ISOVUE-M 200) INJECTION 41%
1.0000 mL | Freq: Once | INTRAMUSCULAR | Status: AC
  Administered 2017-11-19: 1 mL via EPIDURAL

## 2017-11-19 NOTE — Discharge Instructions (Signed)

## 2017-11-22 DIAGNOSIS — M25552 Pain in left hip: Secondary | ICD-10-CM | POA: Diagnosis not present

## 2017-11-22 DIAGNOSIS — I10 Essential (primary) hypertension: Secondary | ICD-10-CM | POA: Diagnosis not present

## 2017-11-22 DIAGNOSIS — Z6829 Body mass index (BMI) 29.0-29.9, adult: Secondary | ICD-10-CM | POA: Diagnosis not present

## 2017-11-22 DIAGNOSIS — M4726 Other spondylosis with radiculopathy, lumbar region: Secondary | ICD-10-CM | POA: Diagnosis not present

## 2017-11-26 ENCOUNTER — Encounter: Payer: Self-pay | Admitting: Family Medicine

## 2017-12-04 ENCOUNTER — Encounter: Payer: Self-pay | Admitting: Family Medicine

## 2017-12-04 ENCOUNTER — Ambulatory Visit: Payer: BLUE CROSS/BLUE SHIELD | Admitting: Family Medicine

## 2017-12-04 VITALS — BP 118/80 | Temp 98.6°F | Ht 72.0 in | Wt 227.0 lb

## 2017-12-04 DIAGNOSIS — R0609 Other forms of dyspnea: Secondary | ICD-10-CM

## 2017-12-04 DIAGNOSIS — R600 Localized edema: Secondary | ICD-10-CM | POA: Diagnosis not present

## 2017-12-04 DIAGNOSIS — M5432 Sciatica, left side: Secondary | ICD-10-CM | POA: Diagnosis not present

## 2017-12-04 DIAGNOSIS — D509 Iron deficiency anemia, unspecified: Secondary | ICD-10-CM | POA: Diagnosis not present

## 2017-12-04 DIAGNOSIS — R0600 Dyspnea, unspecified: Secondary | ICD-10-CM

## 2017-12-04 MED ORDER — PREGABALIN 50 MG PO CAPS: 50.0000 mg | ORAL_CAPSULE | Freq: Two times a day (BID) | ORAL | 5 refills | Status: DC

## 2017-12-04 MED ORDER — OXYCODONE-ACETAMINOPHEN 10-325 MG PO TABS: 1.0000 | ORAL_TABLET | ORAL | 0 refills | Status: DC | PRN

## 2017-12-04 NOTE — Progress Notes (Signed)
Subjective:    Patient ID: [name], male    DOB: 27-Mar-1956, 62 y.o.   MRN: [id number]  HPI  Patient is here today with complaints of back and left leg pain.Has seen neurosurgeon. 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  We discussed this at length he is having significant sciatica pain into the left leg at times it makes it a little hard for him to use a leg because of associated weakness from the pain  Patient relates he finds himself feeling stressed out because of severe pain he is in all the time he does not want to use larger amounts of opioids.  He is hoping there are other things that can be done he is contemplating getting a nerve conduction study  In addition to this patient having significant fatigue tiredness feeling rundown finds himself low energy all the time At times gets short of breath with activity Has underlying heart issues He is worried about the possibility of heart blockage He denies any chest tightness pressure pain He has had a little bit of pedal edema on the left side but he is on medications that can contribute to pedal edema  Denies being depressed    He wanted to discuss pain options.Having some nausea and dizziness on going for month.at times he finds himself feeling a little dizzy at times he finds himself have a little hard time focusing  Review of Systems  Constitutional: Negative for diaphoresis and fatigue.  HENT: Negative for congestion and rhinorrhea.   Respiratory: Positive for shortness of breath. Negative for cough.   Cardiovascular: Negative for chest pain and leg swelling.  Gastrointestinal: Negative for abdominal pain and diarrhea.  Skin: Negative for color change and rash.  Neurological: Negative for dizziness and headaches.  Psychiatric/Behavioral: Negative for behavioral problems and confusion.       Objective:   Physical Exam  Constitutional: He appears well-nourished. No distress.  HENT:  Head:  Normocephalic and atraumatic.  Eyes: Right eye exhibits no discharge. Left eye exhibits no discharge.  Neck: No tracheal deviation present.  Cardiovascular: Normal rate, regular rhythm and normal heart sounds.  No murmur heard. Pulmonary/Chest: Effort normal and breath sounds normal. No respiratory distress.  Musculoskeletal: He exhibits no edema.  Lymphadenopathy:    He has no cervical adenopathy.  Neurological: He is alert. Coordination normal.  Skin: Skin is warm and dry.  Psychiatric: He has a normal mood and affect. His behavior is normal.  Vitals reviewed.  There is a small amount of pedal edema on the left leg a small amount in the right leg not severe EKG does not show any acute changes compared to previous EKG pacer noted  25 minutes spent with the patient 25 minutes was spent with the patient.  This statement verifies that 25 minutes was indeed spent with the patient.  More than 50% of this visit-total duration of the visit-was spent in counseling and coordination of care. The issues that the patient came in for today as reflected in the diagnosis (s) please refer to documentation for further details.     Assessment & Plan:  DOE will check BMP await the results also check metabolic 7  Fatigue tiredness check CBC with ferritin because her could be some underlying issues of iron deficiency since he did have colitis and rectal bleeding several weeks ago Await the results of lab work he will do this in the morning Currently I do not feel he needs  to go see cardiology but this may need to be done depending on results of lab work  Significant chronic pain related to a nerve impingement he will try to get the records from the neurosurgeon for [place] to review he will go ahead with using his oxycodone for severe pain and will bump up the dose of the Lyrica to 50 mg at night for the next week if he tolerates this well he will start using it twice daily  Orthostatics was checked he was  negative for any type of hypertension

## 2017-12-05 ENCOUNTER — Other Ambulatory Visit: Payer: Self-pay | Admitting: Family Medicine

## 2017-12-05 DIAGNOSIS — R600 Localized edema: Secondary | ICD-10-CM | POA: Diagnosis not present

## 2017-12-05 DIAGNOSIS — R0609 Other forms of dyspnea: Secondary | ICD-10-CM | POA: Diagnosis not present

## 2017-12-05 DIAGNOSIS — M5432 Sciatica, left side: Secondary | ICD-10-CM | POA: Diagnosis not present

## 2017-12-05 DIAGNOSIS — D509 Iron deficiency anemia, unspecified: Secondary | ICD-10-CM | POA: Diagnosis not present

## 2017-12-06 LAB — FERRITIN: Ferritin: 173 ng/mL (ref 30–400)

## 2017-12-06 LAB — BASIC METABOLIC PANEL
BUN/Creatinine Ratio: 13 (ref 10–24)
BUN: 14 mg/dL (ref 8–27)
CO2: 28 mmol/L (ref 20–29)
Calcium: 9.3 mg/dL (ref 8.6–10.2)
Chloride: 102 mmol/L (ref 96–106)
Creatinine, Ser: 1.08 mg/dL (ref 0.76–1.27)
GFR calc Af Amer: 84 mL/min/{1.73_m2} (ref >59)
GFR calc non Af Amer: 73 mL/min/{1.73_m2} (ref >59)
Glucose: 142 mg/dL — ABNORMAL HIGH (ref 65–99)
Potassium: 4.5 mmol/L (ref 3.5–5.2)
Sodium: 142 mmol/L (ref 134–144)

## 2017-12-06 LAB — CBC WITH DIFFERENTIAL/PLATELET
Basophils Absolute: 0.1 10*3/uL (ref 0.0–0.2)
Basos: 1 %
EOS (ABSOLUTE): 0.2 10*3/uL (ref 0.0–0.4)
Eos: 2 %
Hematocrit: 43.8 % (ref 37.5–51.0)
Hemoglobin: 14.8 g/dL (ref 13.0–17.7)
Immature Grans (Abs): 0 10*3/uL (ref 0.0–0.1)
Immature Granulocytes: 0 %
Lymphocytes Absolute: 1.8 10*3/uL (ref 0.7–3.1)
Lymphs: 24 %
MCH: 30 pg (ref 26.6–33.0)
MCHC: 33.8 g/dL (ref 31.5–35.7)
MCV: 89 fL (ref 79–97)
Monocytes Absolute: 0.6 10*3/uL (ref 0.1–0.9)
Monocytes: 8 %
Neutrophils Absolute: 4.9 10*3/uL (ref 1.4–7.0)
Neutrophils: 65 %
Platelets: 199 10*3/uL (ref 150–450)
RBC: 4.93 x10E6/uL (ref 4.14–5.80)
RDW: 13 % (ref 12.3–15.4)
WBC: 7.5 10*3/uL (ref 3.4–10.8)

## 2017-12-06 LAB — TSH: TSH: 2.92 u[IU]/mL (ref 0.450–4.500)

## 2017-12-06 LAB — BRAIN NATRIURETIC PEPTIDE: BNP: 103.7 pg/mL — ABNORMAL HIGH (ref 0.0–100.0)

## 2017-12-07 ENCOUNTER — Other Ambulatory Visit: Payer: Self-pay | Admitting: Family Medicine

## 2017-12-07 ENCOUNTER — Encounter: Payer: Self-pay | Admitting: Family Medicine

## 2017-12-07 DIAGNOSIS — R0609 Other forms of dyspnea: Secondary | ICD-10-CM

## 2017-12-07 DIAGNOSIS — R0600 Dyspnea, unspecified: Secondary | ICD-10-CM

## 2017-12-11 ENCOUNTER — Encounter: Payer: Self-pay | Admitting: Family Medicine

## 2017-12-18 ENCOUNTER — Ambulatory Visit (HOSPITAL_COMMUNITY)
Admission: RE | Admit: 2017-12-18 | Discharge: 2017-12-18 | Disposition: A | Discharge status: Home / Self Care | Payer: BLUE CROSS/BLUE SHIELD | Source: Ambulatory Visit | Attending: Family Medicine | Admitting: Family Medicine

## 2017-12-18 DIAGNOSIS — I10 Essential (primary) hypertension: Secondary | ICD-10-CM | POA: Diagnosis not present

## 2017-12-18 DIAGNOSIS — G4733 Obstructive sleep apnea (adult) (pediatric): Secondary | ICD-10-CM | POA: Diagnosis not present

## 2017-12-18 DIAGNOSIS — Z955 Presence of coronary angioplasty implant and graft: Secondary | ICD-10-CM | POA: Diagnosis not present

## 2017-12-18 DIAGNOSIS — I200 Unstable angina: Secondary | ICD-10-CM | POA: Insufficient documentation

## 2017-12-18 DIAGNOSIS — E785 Hyperlipidemia, unspecified: Secondary | ICD-10-CM | POA: Diagnosis not present

## 2017-12-18 DIAGNOSIS — K219 Gastro-esophageal reflux disease without esophagitis: Secondary | ICD-10-CM | POA: Diagnosis not present

## 2017-12-18 DIAGNOSIS — R0600 Dyspnea, unspecified: Secondary | ICD-10-CM | POA: Insufficient documentation

## 2017-12-18 NOTE — Progress Notes (Signed)
*  PRELIMINARY RESULTS* Echocardiogram 2D Echocardiogram has been performed.  Samuel Germany 12/18/2017, 11:38 AM

## 2017-12-27 ENCOUNTER — Encounter: Payer: Self-pay | Admitting: Family Medicine

## 2018-01-03 ENCOUNTER — Telehealth: Payer: Self-pay | Admitting: Cardiology

## 2018-01-03 ENCOUNTER — Encounter: Payer: BLUE CROSS/BLUE SHIELD | Admitting: *Deleted

## 2018-01-03 NOTE — Telephone Encounter (Signed)
Spoke with pt and reminded pt of remote transmission that is due today. Pt verbalized understanding.   

## 2018-01-04 ENCOUNTER — Encounter: Payer: Self-pay | Admitting: Cardiology

## 2018-01-08 ENCOUNTER — Other Ambulatory Visit: Payer: Self-pay

## 2018-01-08 MED ORDER — NADOLOL 80 MG PO TABS: 80.0000 mg | ORAL_TABLET | Freq: Every day | ORAL | 3 refills | Status: DC

## 2018-01-08 NOTE — Progress Notes (Signed)
Cardiology Office Note    Date:  01/10/2018   ID:  JADIS MIKA, DOB 1956-02-14, MRN [id number]  PCP:  Kathyrn Drown, MD  Cardiologist: Kate Sable, MD   EP: Dr. Rayann Heman  Chief Complaint  Patient presents with  . Follow-up    Dyspnea on Exertion    History of Present Illness:    [name] is a 63 y.o. male with past medical history of CAD (s/p PCI of the RCA in 2014), CHB (s/p PPM Placement in 12/2012 with Medtronic CRT-P placement in 03/2014), HTN, and HLD who presents to the office today for evaluation of worsening dyspnea on exertion.  He was last examined by Dr. Bronson Ing in 02/2017 and reported having episodes of dyspnea on exertion but denied any specific chest pain. BP was elevated to 160/84 during his visit, therefore Amlodipine was further titrated to 5 mg daily and he was continued on Losartan and Nadolol. Was evaluated by Chanetta Marshall, NP in 06/2017 and reported episodes of chest pain thought to be atypical for a cardiac etiology but was experiencing hematuria and informed to keep follow-up with Urology. Most recent device check in 10/2017 showed stable thoracic impedance with 3 episodes of NSVT, lasting for a maximum of 13 beats.  He was evaluated by his PCP in 11/2017 and reported worsening fatigue and dyspnea on exertion. Labs were obtained for initial assessment and showed WBC 7.5, Hgb 14.8, platelets 199, Na+ 142, K+ 4.5, and creatinine 1.08. BNP was minimally elevated at 103. Echocardiogram showed a preserved EF of 55 to 60%, mild LVH, no regional wall motion abnormalities, trivial AI, and trivial MR.  In talking with the patient today, he reports having episodes of dyspnea on exertion occurring for the past several years but feels like this has worsened over the past 2 to 3 months. He also describes an aching sensation along his left pectoral region which typically occurs with activity and resolves with rest. He notices this mostly when climbing stairs  in his house or walking up a hill along [place] [address]. Says he can walk for several miles on a flat surface without any anginal symptoms. His history is variable as he told me his dyspnea was new over the past several months but then said it has been occurring since PPM placement in 2014.  He does experience occasional episodes of chest discomfort at rest which "moves around his entire chest" and spontaneously resolves. He has not utilized sublingual nitroglycerin due to not thinking the episodes were severe. He denies any specific orthopnea, PND, lower extremity edema, or palpitations.  He does not check blood pressure regularly but it is well controlled at 128/70 during today's visit.  Past Medical History:  Diagnosis Date  . ADD (attention deficit disorder)    Rx-Adderall  . Anginal pain ([place])   . AV block, 3rd degree (HCC)   . Colon polyps 2011   tubular adenoma by excisional biopsy during colonoscopy  . Complete heart block [facility])    a. s/p PPM Placement in 12/2012 with Medtronic CRT-P placement in 03/2014  . Coronary artery disease    a. s/p PCI of the RCA in 2014  . Degenerative disc disease, cervical   . Gastrointestinal bleeding 10/15/2017  . GERD (gastroesophageal reflux disease)   . Hypertension   . Pacemaker   . Palpitations 2003   Holter in 2003: PACs and PVCs; negative stress nuclear test in 2005; right bundle branch block; echo in 2008-mild LVH, otherwise normal; 2008-negative  stress nuclear test.  . Pneumonia   . Prostatitis    prostate calcifications by CT  . Sleep apnea    questionable diagnosis    Past Surgical History:  Procedure Laterality Date  . ANTERIOR CERVICAL DECOMP/DISCECTOMY FUSION    . BACK SURGERY    . Biventricular pacemaker upgrade/ system revision  04/02/14   removal of previous atrial and ventricular leads with placement of a new MDT Consulta CRT-P system at [facility] by Dr Violet Baldy  . COLONOSCOPY    . COLONOSCOPY W/  POLYPECTOMY  2011   tubular adenoma  . ESOPHAGOGASTRODUODENOSCOPY     Gastritis  . INSERT / REPLACE / REMOVE PACEMAKER  12/17/2012   MDT Adapta L implanted by Dr Rayann Heman for mobitz II second degree AV block  . KNEE SURGERY Right   . LEFT HEART CATHETERIZATION WITH CORONARY ANGIOGRAM N/A 10/17/2012   Procedure: LEFT HEART CATHETERIZATION WITH CORONARY ANGIOGRAM;  Surgeon: Josue Hector, MD;  Location: [facility] CATH LAB;  Service: Cardiovascular;  Laterality: N/A;  . LEFT HEART CATHETERIZATION WITH CORONARY ANGIOGRAM N/A 12/17/2012   Procedure: LEFT HEART CATHETERIZATION WITH CORONARY ANGIOGRAM;  Surgeon: Peter M Martinique, MD;  Location: [facility] CATH LAB;  Service: Cardiovascular;  Laterality: N/A;  . LEFT HEART CATHETERIZATION WITH CORONARY ANGIOGRAM N/A 06/11/2013   Procedure: LEFT HEART CATHETERIZATION WITH CORONARY ANGIOGRAM;  Surgeon: Wellington Hampshire, MD;  Location: [place] CATH LAB;  Service: Cardiovascular;  Laterality: N/A;  . PERCUTANEOUS CORONARY STENT INTERVENTION (PCI-S)  10/17/2012   Procedure: PERCUTANEOUS CORONARY STENT INTERVENTION (PCI-S);  Surgeon: Josue Hector, MD;  Location: [facility] CATH LAB;  Service: Cardiovascular;;  . PERMANENT PACEMAKER INSERTION N/A 12/17/2012   Procedure: PERMANENT PACEMAKER INSERTION;  Surgeon: Thompson Grayer, MD;  Location: [facility] CATH LAB;  Service: Cardiovascular;  Laterality: N/A;  . POLYPECTOMY    . UPPER GASTROINTESTINAL ENDOSCOPY      Current Medications: Outpatient Medications Prior to Visit  Medication Sig Dispense Refill  . ALPRAZolam (XANAX) 0.5 MG tablet Take 1 tablet (0.5 mg total) by mouth 3 (three) times daily as needed for sleep or anxiety. 30 tablet 0  . amLODipine (NORVASC) 5 MG tablet Take 1 tablet (5 mg total) by mouth daily. 30 tablet 5  . aspirin 81 MG chewable tablet Chew 81 mg by mouth daily.    . cyclobenzaprine (FLEXERIL) 5 MG tablet Take 5 mg by mouth at bedtime as needed for muscle spasms.    Marland Kitchen dicyclomine (BENTYL) 20 MG tablet Take 20 mg by mouth 3  (three) times daily as needed for spasms.    Marland Kitchen esomeprazole (NEXIUM) 40 MG capsule TAKE 1 CAPSULE ONCE DAILY 30 capsule 5  . hydrALAZINE (APRESOLINE) 50 MG tablet Take 50 mg by mouth 2 (two) times daily.    Marland Kitchen losartan (COZAAR) 100 MG tablet Take 1 tablet (100 mg total) by mouth daily. 90 tablet 3  . nadolol (CORGARD) 80 MG tablet Take 1 tablet (80 mg total) by mouth daily. 90 tablet 3  . nitroGLYCERIN (NITROSTAT) 0.4 MG SL tablet Place 1 tablet (0.4 mg total) under the tongue every 5 (five) minutes as needed for chest pain (MAX 3 TABLETS). 25 tablet 3  . ondansetron (ZOFRAN) 8 MG tablet Take 1 tablet (8 mg total) by mouth every 8 (eight) hours as needed for nausea. 30 tablet 5  . oxyCODONE-acetaminophen (PERCOCET) 10-325 MG tablet Take 1 tablet by mouth every 4 (four) hours as needed for pain. 30 tablet 0  . pregabalin (LYRICA) 50 MG  capsule Take 50 mg by mouth at bedtime.    . rosuvastatin (CRESTOR) 20 MG tablet TAKE 1 TABLET BY MOUTH DAILY 30 tablet 5  . tamsulosin (FLOMAX) 0.4 MG CAPS capsule TAKE 2 CAPSULES BY MOUTH ONCE DAILY 60 capsule 5  . dicyclomine (BENTYL) 20 MG tablet Take 1 tablet (20 mg total) by mouth 3 (three) times daily as needed for spasms. 10 tablet 0  . docusate sodium (COLACE) 100 MG capsule Take 1 capsule (100 mg total) by mouth daily. (Patient not taking: Reported on 12/04/2017) 10 capsule 0  . HYDROcodone-acetaminophen (NORCO) 10-325 MG tablet Take 1 tablet by mouth every 6 (six) hours as needed for severe pain.    . pregabalin (LYRICA) 50 MG capsule Take 1 capsule (50 mg total) by mouth 2 (two) times daily. (Patient taking differently: Take 50 mg by mouth at bedtime. ) 60 capsule 5   No facility-administered medications prior to visit.      Allergies:   Morphine and related; Lasix [furosemide]; and Latex   Social History   Socioeconomic History  . Marital status: Married    Spouse name: Not on file  . Number of children: 3  . Years of education: Not on file  .  Highest education level: Not on file  Occupational History  . Not on file  Social Needs  . Financial resource strain: Not on file  . Food insecurity:    Worry: Not on file    Inability: Not on file  . Transportation needs:    Medical: Not on file    Non-medical: Not on file  Tobacco Use  . Smoking status: Former Smoker    Packs/day: 2.50    Years: 40.00    Pack years: 100.00    Types: Cigarettes    Start date: 04/18/1967    Last attempt to quit: 12/20/2003    Years since quitting: 14.0  . Smokeless tobacco: Former Network engineer and Sexual Activity  . Alcohol use: Yes  . Drug use: Yes    Types: Marijuana    Comment: 30 + years ago - "crank, marjiuanna, ect."  . Sexual activity: Yes    Birth control/protection: Surgical  Lifestyle  . Physical activity:    Days per week: Not on file    Minutes per session: Not on file  . Stress: Not on file  Relationships  . Social connections:    Talks on phone: Not on file    Gets together: Not on file    Attends religious service: Not on file    Active member of club or organization: Not on file    Attends meetings of clubs or organizations: Not on file    Relationship status: Not on file  Other Topics Concern  . Not on file  Social History Narrative  . Not on file     Family History:  The patient's family history includes Breast cancer in his maternal aunt; Heart disease in his mother; Hypertension in his mother; Prostate cancer in his brother.   Review of Systems:   Please see the history of present illness.     General:  No chills, fever, night sweats or weight changes.  Cardiovascular:  No edema, orthopnea, palpitations, paroxysmal nocturnal dyspnea.  Positive for chest pain and dyspnea on exertion. Dermatological: No rash, lesions/masses Respiratory: No cough, dyspnea Urologic: No hematuria, dysuria Abdominal:   No nausea, vomiting, diarrhea, bright red blood per rectum, melena, or hematemesis Neurologic:  No visual  changes, wkns, changes  in mental status. All other systems reviewed and are otherwise negative except as noted above.   Physical Exam:    VS:  BP 128/70   Pulse 63   Ht 5\' 11"  (1.803 m)   Wt 228 lb (103.4 kg)   SpO2 96%   BMI 31.80 kg/m    General: Well developed, well nourished Caucasian male appearing in no acute distress. Head: Normocephalic, atraumatic, sclera non-icteric, no xanthomas, nares are without discharge.  Neck: No carotid bruits. JVD not elevated.  Lungs: Respirations regular and unlabored, without wheezes or rales.  Heart: Regular rate and rhythm. No S3 or S4.  No murmur, no rubs, or gallops appreciated. Abdomen: Soft, non-tender, non-distended with normoactive bowel sounds. No hepatomegaly. No rebound/guarding. No obvious abdominal masses. Msk:  Strength and tone appear normal for age. No joint deformities or effusions. Extremities: No clubbing or cyanosis. No lower extremity edema.  Distal pedal pulses are 2+ bilaterally. Neuro: Alert and oriented X 3. Moves all extremities spontaneously. No focal deficits noted. Psych:  Responds to questions appropriately with a normal affect. Skin: No rashes or lesions noted  Wt Readings from Last 3 Encounters:  01/09/18 228 lb (103.4 kg)  12/04/17 227 lb 0.2 oz (103 kg)  11/15/17 215 lb (97.5 kg)    Studies/Labs Reviewed:   EKG:  EKG is not ordered today. EKG from 12/07/2017 is reviewed which shows V-pacing, HR 60 bpm with no acute changes when compared to prior tracings.   Recent Labs: 10/15/2017: ALT 23 12/05/2017: BNP 103.7; BUN 14; Creatinine, Ser 1.08; Hemoglobin 14.8; Platelets 199; Potassium 4.5; Sodium 142; TSH 2.920   Lipid Panel    Component Value Date/Time   CHOL 127 08/06/2017 0959   TRIG 68 08/06/2017 0959   HDL 58 08/06/2017 0959   CHOLHDL 2.2 08/06/2017 0959   CHOLHDL 2.5 12/17/2012 0215   VLDL 9 12/17/2012 0215   LDLCALC 55 08/06/2017 0959    Additional studies/ records that were reviewed today  include:   NST: 06/2016  Ventricular paced rhythm noted throughout.  Small, mild intensity, partially reversible apical inferolateral defect consistent with a small ischemic territory. Diaphragmatic attenuation also noted.  This is a low risk study.  Nuclear stress EF: 60%.  Echocardiogram: 12/2017 Study Conclusions  - Left ventricle: The cavity size was normal. Wall thickness was   increased in a pattern of mild LVH. Systolic function was normal.   The estimated ejection fraction was in the range of 55% to 60%.   Wall motion was normal; there were no regional wall motion   abnormalities. - Aortic valve: Mildly to moderately calcified annulus. Trileaflet.   There was trivial regurgitation. Mean gradient (S): 7 mm Hg. Peak   gradient (S): 14 mm Hg. VTI ratio of LVOT to aortic valve: 0.57.   Valve area (VTI): 2.15 cm^2. - Mitral valve: There was trivial regurgitation. - Left atrium: The atrium was mildly dilated. - Right ventricle: Pacer wire or catheter noted in right ventricle. - Right atrium: Pacer wire or catheter noted in right atrium. - Tricuspid valve: There was trivial regurgitation. - Pulmonary arteries: Systolic pressure could not be accurately   estimated. - Pericardium, extracardiac: There was no pericardial effusion.    Assessment:    1. Chest pain, unspecified type   2. DOE (dyspnea on exertion)   3. Coronary artery disease of bypass graft of native heart with stable angina pectoris ([place])   4. Mobitz type II atrioventricular block   5. Essential hypertension  6. Hyperlipidemia LDL goal <70      Plan:   In order of problems listed above:  1. Chest Pain/ Dyspnea on Exertion - he reports having episodes of dyspnea on exertion mostly when climbing stairs or walking up hills with associated chest discomfort which typically improves at rest. At other times, he develops chest discomfort at rest and describes this as a "gas" that travels through his chest. His  resting symptoms seem atypical but his episodes of dyspnea and chest pain on exertion are consistent with his prior anginal symptoms and concerning for progressive angina.  - recent labs obtained by his PCP showed no acute findings and echocardiogram showed a preserved EF of 55 to 60%, mild LVH, no regional wall motion abnormalities, trivial AI, and trivial MR.  - we reviewed options for ischemic evaluation at the time of his office visit today and he is agreement for a repeat Lexiscan Myoview. Says he would be hesitant to go through a repeat catheterization but would be in agreement if his stress test comes back abnormal. He does have SL NTG at home and we reviewed instructions on the use of this.   2. CAD - s/p PCI of the RCA in 2014 with most recent ischemic evaluation being a low-risk NST in 06/2016. Will plan for a repeat Lexiscan Myoview as outlined above.  - continue ASA, BB, and statin therapy.   3. CHB - s/p PPM Placement in 12/2012 with Medtronic CRT-P placement in 03/2014. Device interrogation in 10/2017 showed normal device function. - followed by Dr. Rayann Heman.   4. HTN - BP is well-controlled at 128/70 during today's visit. - continue Amlodipine 5mg  daily, Hydralazine 50mg  BID, Losartan 100mg  daily,and Nadolol 80mg  daily.   5. HLD - FLP in 07/2017 showed total cholesterol of 127, HDL 58, and LDL 55. At goal of LDL < 70. Remains on Crestor 20mg  daily.     Medication Adjustments/Labs and Tests Ordered: Current medicines are reviewed at length with the patient today.  Concerns regarding medicines are outlined above.  Medication changes, Labs and Tests ordered today are listed in the Patient Instructions below. Patient Instructions  Medication Instructions:  Your physician recommends that you continue on your current medications as directed. Please refer to the Current Medication list given to you today.   Labwork: NONE   Testing/Procedures: Your physician has requested that  you have a lexiscan myoview. For further information please visit [email or web address]. Please follow instruction sheet, as given.  Follow-Up: Your physician recommends that you schedule a follow-up appointment in: 2 Months    Any Other Special Instructions Will Be Listed Below (If Applicable).  If you need a refill on your cardiac medications before your next appointment, please call your pharmacy.  Thank you for choosing [facility]!    Signed, [name], PA-C  01/10/2018 7:47 AM    [place] [address] [place], [place] [address] Phone: [phone]

## 2018-01-09 ENCOUNTER — Encounter

## 2018-01-09 ENCOUNTER — Encounter: Payer: Self-pay | Admitting: Student

## 2018-01-09 ENCOUNTER — Encounter: Payer: Self-pay | Admitting: *Deleted

## 2018-01-09 ENCOUNTER — Ambulatory Visit: Payer: BLUE CROSS/BLUE SHIELD | Admitting: Student

## 2018-01-09 VITALS — BP 128/70 | HR 63 | Ht 71.0 in | Wt 228.0 lb

## 2018-01-09 DIAGNOSIS — E785 Hyperlipidemia, unspecified: Secondary | ICD-10-CM

## 2018-01-09 DIAGNOSIS — R0609 Other forms of dyspnea: Secondary | ICD-10-CM

## 2018-01-09 DIAGNOSIS — I25708 Atherosclerosis of coronary artery bypass graft(s), unspecified, with other forms of angina pectoris: Secondary | ICD-10-CM | POA: Diagnosis not present

## 2018-01-09 DIAGNOSIS — I441 Atrioventricular block, second degree: Secondary | ICD-10-CM

## 2018-01-09 DIAGNOSIS — R0600 Dyspnea, unspecified: Secondary | ICD-10-CM

## 2018-01-09 DIAGNOSIS — I10 Essential (primary) hypertension: Secondary | ICD-10-CM

## 2018-01-09 DIAGNOSIS — R079 Chest pain, unspecified: Secondary | ICD-10-CM

## 2018-01-09 NOTE — Patient Instructions (Signed)
Medication Instructions:  Your physician recommends that you continue on your current medications as directed. Please refer to the Current Medication list given to you today.   Labwork: NONE   Testing/Procedures: Your physician has requested that you have a lexiscan myoview. For further information please visit [email or web address]. Please follow instruction sheet, as given.    Follow-Up: Your physician recommends that you schedule a follow-up appointment in: 2 Months    Any Other Special Instructions Will Be Listed Below (If Applicable).     If you need a refill on your cardiac medications before your next appointment, please call your pharmacy.  Thank you for choosing [facility]!   

## 2018-01-10 ENCOUNTER — Encounter: Payer: Self-pay | Admitting: Student

## 2018-01-14 ENCOUNTER — Encounter (HOSPITAL_BASED_OUTPATIENT_CLINIC_OR_DEPARTMENT_OTHER)
Admission: RE | Admit: 2018-01-14 | Discharge: 2018-01-14 | Disposition: A | Discharge status: Home / Self Care | Payer: BLUE CROSS/BLUE SHIELD | Source: Ambulatory Visit | Attending: Student | Admitting: Student

## 2018-01-14 ENCOUNTER — Encounter (HOSPITAL_COMMUNITY)
Admission: RE | Admit: 2018-01-14 | Discharge: 2018-01-14 | Disposition: A | Discharge status: Home / Self Care | Payer: BLUE CROSS/BLUE SHIELD | Source: Ambulatory Visit | Attending: Student | Admitting: Student

## 2018-01-14 DIAGNOSIS — R079 Chest pain, unspecified: Secondary | ICD-10-CM | POA: Insufficient documentation

## 2018-01-14 DIAGNOSIS — R0609 Other forms of dyspnea: Secondary | ICD-10-CM

## 2018-01-14 LAB — NM MYOCAR MULTI W/SPECT W/WALL MOTION / EF
LV dias vol: 122 mL (ref 62–150)
LV sys vol: 57 mL
Peak HR: 83 {beats}/min
RATE: 0.33
Rest HR: 60 {beats}/min
SDS: 1
SRS: 0
SSS: 1
TID: 1.19

## 2018-01-14 MED ORDER — TECHNETIUM TC 99M TETROFOSMIN IV KIT
10.0000 | PACK | Freq: Once | INTRAVENOUS | Status: AC | PRN
  Administered 2018-01-14: 9.5 via INTRAVENOUS

## 2018-01-14 MED ORDER — REGADENOSON 0.4 MG/5ML IV SOLN
INTRAVENOUS | Status: AC
  Administered 2018-01-14: 0.4 mg via INTRAVENOUS
  Filled 2018-01-14: qty 5

## 2018-01-14 MED ORDER — SODIUM CHLORIDE 0.9% FLUSH
INTRAVENOUS | Status: AC
  Administered 2018-01-14: 10 mL via INTRAVENOUS
  Filled 2018-01-14: qty 10

## 2018-01-14 MED ORDER — TECHNETIUM TC 99M TETROFOSMIN IV KIT
30.0000 | PACK | Freq: Once | INTRAVENOUS | Status: AC | PRN
  Administered 2018-01-14: 30.6 via INTRAVENOUS

## 2018-01-18 NOTE — Telephone Encounter (Signed)
-----   Message from [name], [place] sent at 01/15/2018  7:16 AM EDT ----- Please let the patient know his stress test showed no areas of significant ischemia and was overall similar to prior imaging in 2018.  Was interpreted as a low risk study.  Has he had to utilize any sublingual nitroglycerin?  If so, can consider starting Imdur 15mg  daily. If no change in symptoms, would continue with SL NTG. Please forward a copy of the results to Kathyrn Drown, MD. Thank you.

## 2018-01-18 NOTE — Telephone Encounter (Signed)
Pt made aware of results. He stated that he has not has to utilize any SL NTG. A copy of test was sent to Dr. Wolfgang Phoenix.

## 2018-01-30 DIAGNOSIS — L570 Actinic keratosis: Secondary | ICD-10-CM | POA: Diagnosis not present

## 2018-01-30 DIAGNOSIS — D1801 Hemangioma of skin and subcutaneous tissue: Secondary | ICD-10-CM | POA: Diagnosis not present

## 2018-01-30 DIAGNOSIS — D224 Melanocytic nevi of scalp and neck: Secondary | ICD-10-CM | POA: Diagnosis not present

## 2018-01-30 DIAGNOSIS — D225 Melanocytic nevi of trunk: Secondary | ICD-10-CM | POA: Diagnosis not present

## 2018-02-06 ENCOUNTER — Ambulatory Visit: Payer: BLUE CROSS/BLUE SHIELD | Admitting: Family Medicine

## 2018-02-06 ENCOUNTER — Encounter: Payer: Self-pay | Admitting: Family Medicine

## 2018-02-06 VITALS — Ht 71.0 in | Wt 225.8 lb

## 2018-02-06 DIAGNOSIS — R600 Localized edema: Secondary | ICD-10-CM | POA: Diagnosis not present

## 2018-02-06 DIAGNOSIS — M5432 Sciatica, left side: Secondary | ICD-10-CM | POA: Diagnosis not present

## 2018-02-06 DIAGNOSIS — M79605 Pain in left leg: Secondary | ICD-10-CM | POA: Diagnosis not present

## 2018-02-06 MED ORDER — PREGABALIN 50 MG PO CAPS: 50.0000 mg | ORAL_CAPSULE | Freq: Two times a day (BID) | ORAL | 5 refills | Status: DC

## 2018-02-06 NOTE — Progress Notes (Signed)
   Subjective:    Patient ID: [name], male    DOB: [birth year], 62 y.o.   MRN: [id number]  HPI Patient arrives with left leg pain and swelling-worse at night. Relates left leg pain discomfort worse at night denies fever sweats chills also relates some swelling in the lower calf and around the ankle denies any injury to it has been treated for sciatica.  Patient has seen his back specialist to get a myelogram patient is hoping to get better without any surgery  Review of Systems  Constitutional: Negative for activity change, appetite change and fatigue.  HENT: Negative for congestion and rhinorrhea.   Respiratory: Negative for cough and shortness of breath.   Cardiovascular: Negative for chest pain and leg swelling.  Gastrointestinal: Negative for abdominal pain, nausea and vomiting.  Musculoskeletal: Positive for back pain.  Neurological: Negative for dizziness and headaches.  Psychiatric/Behavioral: Negative for agitation and behavioral problems.  Back pain does radiate down the left leg     Objective:   Physical Exam Lungs are clear respiratory rate normal heart rhythm controlled Extremities trace edema in the ankles none in the calf       Assessment & Plan:  Left leg sciatica Benign growth on nerve root specialist will be doing a follow-up scan in approximately 5 months Increase Lyrica to twice daily to help him with the pain Patient does not want to be on opioids which I respect Physical therapy consultation recommended Hold off on further imaging With leg swelling we will check d-dimer and ultrasound of the leg Likelihood of blood clot is possible but not high Patient presents at our office at 5 PM ultrasound can be done tomorrow morning

## 2018-02-07 ENCOUNTER — Ambulatory Visit (HOSPITAL_COMMUNITY)
Admission: RE | Admit: 2018-02-07 | Discharge: 2018-02-07 | Disposition: A | Discharge status: Home / Self Care | Payer: BLUE CROSS/BLUE SHIELD | Source: Ambulatory Visit | Attending: Family Medicine | Admitting: Family Medicine

## 2018-02-07 ENCOUNTER — Other Ambulatory Visit (HOSPITAL_COMMUNITY)
Admission: RE | Admit: 2018-02-07 | Discharge: 2018-02-07 | Disposition: A | Discharge status: Home / Self Care | Payer: BLUE CROSS/BLUE SHIELD | Source: Ambulatory Visit | Attending: Family Medicine | Admitting: Family Medicine

## 2018-02-07 DIAGNOSIS — M79605 Pain in left leg: Secondary | ICD-10-CM | POA: Insufficient documentation

## 2018-02-07 DIAGNOSIS — R600 Localized edema: Secondary | ICD-10-CM | POA: Insufficient documentation

## 2018-02-07 LAB — D-DIMER, QUANTITATIVE: D-Dimer, Quant: 0.42 ug/mL-FEU (ref 0.00–0.50)

## 2018-02-12 ENCOUNTER — Other Ambulatory Visit: Payer: Self-pay

## 2018-02-12 ENCOUNTER — Encounter (HOSPITAL_COMMUNITY): Payer: Self-pay

## 2018-02-12 ENCOUNTER — Ambulatory Visit (HOSPITAL_COMMUNITY): Payer: BLUE CROSS/BLUE SHIELD | Attending: Family Medicine

## 2018-02-12 ENCOUNTER — Other Ambulatory Visit: Payer: Self-pay | Admitting: Cardiovascular Disease

## 2018-02-12 DIAGNOSIS — G8929 Other chronic pain: Secondary | ICD-10-CM

## 2018-02-12 DIAGNOSIS — M545 Low back pain, unspecified: Secondary | ICD-10-CM

## 2018-02-12 DIAGNOSIS — R29898 Other symptoms and signs involving the musculoskeletal system: Secondary | ICD-10-CM | POA: Insufficient documentation

## 2018-02-12 DIAGNOSIS — M25552 Pain in left hip: Secondary | ICD-10-CM | POA: Insufficient documentation

## 2018-02-12 DIAGNOSIS — M6281 Muscle weakness (generalized): Secondary | ICD-10-CM

## 2018-02-12 NOTE — Patient Instructions (Signed)
Access Code: [email or web address]  URL: [email or web address]  Date: 02/12/2018  Prepared by: Geraldine Solar   Exercises Supine Hamstring Stretch - 10 reps - 3 sets - 1x daily - 7x weekly Supine Figure 4 Piriformis Stretch - 10 reps - 3 sets - 1x daily - 7x weekly Supine Single Knee to Chest - 10 reps - 3 sets - 1x daily - 7x weekly

## 2018-02-12 NOTE — Therapy (Signed)
[place], [place], [address] Phone: [phone]   Fax:  [phone]  Physical Therapy Evaluation  Patient Details  Name: [name] MRN: [id number] Date of Birth: 05/17/1955 Referring Provider (PT): Sallee Lange, MD   Encounter Date: 02/12/2018  PT End of Session - 02/12/18 1216    Visit Number  1    Number of Visits  13    Date for PT Re-Evaluation  03/26/18   mini reassess 03/05/18   Authorization Type  [employer] Other    Authorization Time Period  02/12/18 to 03/26/18    Authorization - Visit Number  1    Authorization - Number of Visits  30   PT/OT/Chiro combined   PT Start Time  0947    PT Stop Time  1032    PT Time Calculation (min)  45 min    Activity Tolerance  Patient tolerated treatment well    Behavior During Therapy  [facility] for tasks assessed/performed       Past Medical History:  Diagnosis Date  . ADD (attention deficit disorder)    Rx-Adderall  . Anginal pain ([place])   . AV block, 3rd degree (HCC)   . Colon polyps 2011   tubular adenoma by excisional biopsy during colonoscopy  . Complete heart block [facility])    a. s/p PPM Placement in 12/2012 with Medtronic CRT-P placement in 03/2014  . Coronary artery disease    a. s/p PCI of the RCA in 2014  . Degenerative disc disease, cervical   . Gastrointestinal bleeding 10/15/2017  . GERD (gastroesophageal reflux disease)   . Hypertension   . Pacemaker   . Palpitations 2003   Holter in 2003: PACs and PVCs; negative stress nuclear test in 2005; right bundle branch block; echo in 2008-mild LVH, otherwise normal; 2008-negative stress nuclear test.  . Pneumonia   . Prostatitis    prostate calcifications by CT  . Sleep apnea    questionable diagnosis    Past Surgical History:  Procedure Laterality Date  . ANTERIOR CERVICAL DECOMP/DISCECTOMY FUSION    . BACK SURGERY    . Biventricular pacemaker upgrade/ system revision  04/02/14   removal of  previous atrial and ventricular leads with placement of a new MDT Consulta CRT-P system at [facility] by Dr Violet Baldy  . COLONOSCOPY    . COLONOSCOPY W/ POLYPECTOMY  2011   tubular adenoma  . ESOPHAGOGASTRODUODENOSCOPY     Gastritis  . INSERT / REPLACE / REMOVE PACEMAKER  12/17/2012   MDT Adapta L implanted by Dr Rayann Heman for mobitz II second degree AV block  . KNEE SURGERY Right   . LEFT HEART CATHETERIZATION WITH CORONARY ANGIOGRAM N/A 10/17/2012   Procedure: LEFT HEART CATHETERIZATION WITH CORONARY ANGIOGRAM;  Surgeon: Josue Hector, MD;  Location: [facility] CATH LAB;  Service: Cardiovascular;  Laterality: N/A;  . LEFT HEART CATHETERIZATION WITH CORONARY ANGIOGRAM N/A 12/17/2012   Procedure: LEFT HEART CATHETERIZATION WITH CORONARY ANGIOGRAM;  Surgeon: Peter M Martinique, MD;  Location: [facility] CATH LAB;  Service: Cardiovascular;  Laterality: N/A;  . LEFT HEART CATHETERIZATION WITH CORONARY ANGIOGRAM N/A 06/11/2013   Procedure: LEFT HEART CATHETERIZATION WITH CORONARY ANGIOGRAM;  Surgeon: Wellington Hampshire, MD;  Location: [facility] CATH LAB;  Service: Cardiovascular;  Laterality: N/A;  . PERCUTANEOUS CORONARY STENT INTERVENTION (PCI-S)  10/17/2012   Procedure: PERCUTANEOUS CORONARY STENT INTERVENTION (PCI-S);  Surgeon: Josue Hector, MD;  Location: [facility] CATH LAB;  Service: Cardiovascular;;  . PERMANENT PACEMAKER  INSERTION N/A 12/17/2012   Procedure: PERMANENT PACEMAKER INSERTION;  Surgeon: Thompson Grayer, MD;  Location: [facility] CATH LAB;  Service: Cardiovascular;  Laterality: N/A;  . POLYPECTOMY    . UPPER GASTROINTESTINAL ENDOSCOPY      There were no vitals filed for this visit.   Subjective Assessment - 02/12/18 0951    Subjective  Pt reports that he has been having LBP, which is more severe on the L side. This has been going on for the last year and is of insidious onset. He states that he had a myelogram about 3MA and it showed a "hopefully" benign tumor pressing on his nerve. He reports his pain starts in his  lower back which radiates a burning sensation in his L buttock and then he has a numbing/aching pain that goes into his foot. Sitting aggravates his pain, but he states he is always in pain. Walking helps some and medications help as well. He is presenting to therapy for conservative treatment. His doctors are going to watch the tumor to assess for any growth/progression. He has a pacemaker which runs at 70bpm so he can't have an MRI.     Pertinent History  pacemaker    Limitations  Sitting;House hold activities    How long can you sit comfortably?  1 min    How long can you stand comfortably?  always hurting    How long can you walk comfortably?  >63mile (used to do 6 miles)    Patient Stated Goals  pain relief and stretching out    Currently in Pain?  Yes    Pain Score  5     Pain Location  Back    Pain Orientation  Left;Lower    Pain Descriptors / Indicators  Burning;Tingling;Aching    Pain Type  Chronic pain    Pain Radiating Towards  into R buttock    Pain Onset  More than a month ago    Pain Frequency  Constant    Aggravating Factors   sitting    Pain Relieving Factors  walking some, meds    Effect of Pain on Daily Activities  increases         [facility] PT Assessment - 02/12/18 0001      Assessment   Medical Diagnosis  L sided sciatica, L leg pain    Referring Provider (PT)  Sallee Lange, MD    Onset Date/Surgical Date  --   about 1year ago   Next MD Visit  after therapy     Prior Therapy  none      Balance Screen   Has the patient fallen in the past 6 months  No    Has the patient had a decrease in activity level because of a fear of falling?   No    Is the patient reluctant to leave their home because of a fear of falling?   No      Prior Function   Level of Independence  Independent    Vocation  Retired    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    Leisure  working out, traveling, golfing      Observation/Other Assessments   Focus on Therapeutic  Outcomes (FOTO)   to be completed next visit      Sensation   Additional Comments  DTRs BLE WNL      ROM / Strength   AROM / PROM / Strength  AROM;Strength      AROM  AROM Assessment Site  Lumbar    Lumbar Flexion  25% limitation, pain, in lordosis    Lumbar Extension  WFL    Lumbar - Right Side Bend  knee joint, stretch    Lumbar - Left Side Bend  knee joint, stretch    Lumbar - Right Rotation  WFL, nonpainful    Lumbar - Left Rotation  WFL, nonpainful      Strength   Strength Assessment Site  Hip;Knee;Ankle    Right Hip Flexion  5/5    Right Hip Extension  4+/5    Right Hip ABduction  4/5    Left Hip Flexion  4+/5    Left Hip Extension  4/5    Left Hip ABduction  4/5    Right Knee Flexion  5/5    Right Knee Extension  5/5    Left Knee Flexion  5/5    Left Knee Extension  5/5    Right Ankle Dorsiflexion  5/5    Left Ankle Dorsiflexion  5/5      Flexibility   Soft Tissue Assessment /Muscle Length  yes    Hamstrings  90/90: R: 145deg L:139deg      Palpation   Spinal mobility  hypomobile throughout lumbar and thoracic spine; CPAs to L4 and L5 increased LLE radicular pain which centralized wiht prolonged bout    Palpation comment  mod-max restrictions thorughout paraspinals, glutes, piriformis, L>R; recreated L hip pain      Special Tests    Special Tests  Lumbar    Lumbar Tests  Straight Leg Raise      Straight Leg Raise   Findings  Negative    Comment  BLE; only cuased stretch; L tighter than R      Ambulation/Gait   Ambulation Distance (Feet)  820 Feet   3MWT   Assistive device  None    Gait Pattern  Step-through pattern;Trendelenburg    Gait Comments  increased LBP soreness and L foot numbness      Balance   Balance Assessed  Yes      Static Standing Balance   Static Standing - Balance Support  No upper extremity supported    Static Standing Balance -  Activities   Single Leg Stance - Right Leg;Single Leg Stance - Left Leg    Static Standing - Comment/#  of Minutes  R: 60sec L: 60sec, increased burning LLE and more pain down leg      Standardized Balance Assessment   Standardized Balance Assessment  Five Times Sit to Stand    Five times sit to stand comments   14.14sec, chair, slight weight shift off LLE           Objective measurements completed on examination: See above findings.         PT Education - 02/12/18 1215    Education Details  exam findings, POC, HEP    Person(s) Educated  Patient    Methods  Explanation;Demonstration;Handout    Comprehension  Verbalized understanding;Returned demonstration       PT Short Term Goals - 02/12/18 1220      PT SHORT TERM GOAL #1   Title  Pt will have improved proximal hip MMT to 4+/5 in order to decrease LBP and improve functional mobility.    Time  3    Period  Weeks    Status  New    Target Date  03/05/18      PT SHORT TERM GOAL #2   Title  Pt will have improved bil HS length to 150deg in order to demo improved flexibility and reduce LBP.    Time  3    Period  Weeks    Status  New      PT SHORT TERM GOAL #3   Title  Pt will be able to perform L SLS for 60sec without increases in L hip pain to demo improved core strength, L hip strength, and L hip endurance in order to maximize his ambulation.    Time  3    Period  Weeks    Status  New      PT SHORT TERM GOAL #4   Title  Pt will be able to sit for 10 mins comfortably to demo improved tolerance to this position and allow him to take a short drive comfortably.    Time  3    Period  Weeks    Status  New        PT Long Term Goals - 02/12/18 1220      PT LONG TERM GOAL #1   Title  Pt will have improved proximal hip MMT to 5/5 in order to further reduce LBP and LLE symptoms and maximize his tolerance to ambulation.    Time  6    Period  Weeks    Status  New    Target Date  03/26/18      PT LONG TERM GOAL #2   Title  Pt will be able to sit for 30 mins comfortably to allow him to enjoy a meal with his family.     Time  6    Period  Weeks    Status  New      PT LONG TERM GOAL #3   Title  Pt will report being able to walk for 2 miles or longer with his wife and dogs without increases in pain in order to demo improved functional strength and tolerance to functional tasks to promote return to PLOF.     Time  6    Period  Weeks    Status  New      PT LONG TERM GOAL #4   Title  Pt will have reduced average daily pain to 2/10 or less to allow him to perform all functional and [place] activities with less pain and maximize return to PLOF.    Time  6    Period  Weeks    Status  New             Plan - 02/12/18 1217    Clinical Impression Statement  Pt is pleasant 62YO M who presents to OPPT with c/o chronic LBP with LLE radicular/referred pain for the last year of insidious onset. He describes his pain as tingling in lower back, burning in L buttock, and numbing/aching down to his L foot. Per pt he has a "tumor pressing on the nerve;" per imaging, he has "fusiform enlargement of the left L4 nerve consistent with neurofibroma." He is being referred to OPPT for conservative treatment of his pain. He currently presents with deficits in core strength, proximal hip strength, soft tissue restrictions of paraspinals and glutes, thoracic and lumbar spine joint mobility, HS flexibility (L>R), and pain. Pt reporting that CPAs to L4-5 increased his LLE pain and centralized with prolonged joint mobs. He also reported having pin-point pain near greater trochanter and upon assessment, this complaint is consistent with trochanteric bursitis, which could also be contributing to his LBP and LLE pain.  Pt needs skilled PT intervention to address these impairments in order to reduce pain and maximize QOL.     History and Personal Factors relevant to plan of care:  pacemaker (70bpm) - use RPE scale for determining intensity of therapy sessions    Clinical Presentation  Stable    Clinical Presentation due to:  see flowsheets for  objective tests and measures and pt's limitations    Clinical Decision Making  Low    Rehab Potential  Good    PT Frequency  2x / week    PT Duration  6 weeks    PT Treatment/Interventions  ADLs/Self Care Home Management;Aquatic Therapy;Cryotherapy;Electrical Stimulation;Moist Heat;Traction;Ultrasound;Gait training;Stair training;Functional mobility training;Therapeutic activities;Therapeutic exercise;Balance training;Neuromuscular re-education;Patient/family education;Manual techniques;Passive range of motion;Dry needling;Energy conservation;Taping;Spinal Manipulations;Joint Manipulations    PT Next Visit Plan  review goals; administer FOTO; lumbar and hip stretching, initiate core, BLE, functional strengthening and manual STM/spinal joint mobs (if with PT)     PT Home Exercise Plan  eval: supine HSS (hands behind knee), SKTC, supine figure 4    Consulted and Agree with Plan of Care  Patient       Patient will benefit from skilled therapeutic intervention in order to improve the following deficits and impairments:  Abnormal gait, Decreased activity tolerance, Decreased endurance, Decreased mobility, Decreased range of motion, Decreased strength, Difficulty walking, Hypomobility, Increased fascial restricitons, Increased muscle spasms, Impaired flexibility, Pain  Visit Diagnosis: Chronic bilateral low back pain, unspecified whether sciatica present - Plan: PT plan of care cert/re-cert  Pain in left hip - Plan: PT plan of care cert/re-cert  Muscle weakness (generalized) - Plan: PT plan of care cert/re-cert  Other symptoms and signs involving the musculoskeletal system - Plan: PT plan of care cert/re-cert     Problem List Patient Active Problem List   Diagnosis Date Noted  . Ischemic colitis ([place])   . Hematochezia 10/15/2017  . Hyperlipidemia 01/04/2016  . Anxiety as acute reaction to exceptional stress 05/28/2015  . OSA (obstructive sleep apnea) 10/16/2013  . Obstructive sleep apnea  08/25/2013  . BPH (benign prostatic hyperplasia) 08/25/2013  . Thoracic back pain 08/25/2013  . Chest pain, exertional 12/26/2012  . Intermediate coronary syndrome ([place]) 12/17/2012  . Bradycardia 12/17/2012  . Mobitz type II atrioventricular block 12/17/2012  . Coronary atherosclerosis of native coronary artery 10/18/2012  . S/P drug eluting coronary stent placement 10/18/2012  . Exertional dyspnea 12/26/2011  . Abdominal  pain 12/26/2011  . Hypertension   . ADD (attention deficit disorder)   . Palpitations   . GERD (gastroesophageal reflux disease) 12/20/2011  . Hx of adenomatous colonic polyps 12/20/2011        Geraldine Solar PT, DPT  [facility] [address] [place], [place], [address] Phone: [phone]   Fax:  [phone]  Name: JACIEL DIEM MRN: [id number] Date of Birth: [birth year]

## 2018-02-14 ENCOUNTER — Ambulatory Visit (HOSPITAL_COMMUNITY): Payer: BLUE CROSS/BLUE SHIELD | Admitting: Physical Therapy

## 2018-02-14 DIAGNOSIS — M545 Low back pain, unspecified: Secondary | ICD-10-CM

## 2018-02-14 DIAGNOSIS — G8929 Other chronic pain: Secondary | ICD-10-CM | POA: Diagnosis not present

## 2018-02-14 DIAGNOSIS — M6281 Muscle weakness (generalized): Secondary | ICD-10-CM

## 2018-02-14 DIAGNOSIS — R29898 Other symptoms and signs involving the musculoskeletal system: Secondary | ICD-10-CM

## 2018-02-14 DIAGNOSIS — M25552 Pain in left hip: Secondary | ICD-10-CM

## 2018-02-14 NOTE — Therapy (Signed)
[place], [place], [address] Phone: [phone]   Fax:  [phone]  Physical Therapy Treatment  Patient Details  Name: [name] MRN: [id number] Date of Birth: [birth year] Referring Provider (PT): Sallee Lange, MD   Encounter Date: 02/14/2018  PT End of Session - 02/14/18 1027    Visit Number  2    Number of Visits  13    Date for PT Re-Evaluation  03/26/18   mini reassess 03/05/18   Authorization Type  BCBS Other    Authorization Time Period  02/12/18 to 03/26/18    Authorization - Visit Number  2    Authorization - Number of Visits  30   PT/OT/Chiro combined   PT Start Time  0945    PT Stop Time  1030    PT Time Calculation (min)  45 min    Activity Tolerance  Patient tolerated treatment well    Behavior During Therapy  [facility] for tasks assessed/performed       Past Medical History:  Diagnosis Date  . ADD (attention deficit disorder)    Rx-Adderall  . Anginal pain ([place])   . AV block, 3rd degree (HCC)   . Colon polyps 2011   tubular adenoma by excisional biopsy during colonoscopy  . Complete heart block [facility])    a. s/p PPM Placement in 12/2012 with Medtronic CRT-P placement in 03/2014  . Coronary artery disease    a. s/p PCI of the RCA in 2014  . Degenerative disc disease, cervical   . Gastrointestinal bleeding 10/15/2017  . GERD (gastroesophageal reflux disease)   . Hypertension   . Pacemaker   . Palpitations 2003   Holter in 2003: PACs and PVCs; negative stress nuclear test in 2005; right bundle branch block; echo in 2008-mild LVH, otherwise normal; 2008-negative stress nuclear test.  . Pneumonia   . Prostatitis    prostate calcifications by CT  . Sleep apnea    questionable diagnosis    Past Surgical History:  Procedure Laterality Date  . ANTERIOR CERVICAL DECOMP/DISCECTOMY FUSION    . BACK SURGERY    . Biventricular pacemaker upgrade/ system revision  04/02/14   removal of  previous atrial and ventricular leads with placement of a new MDT Consulta CRT-P system at [facility] by Dr Violet Baldy  . COLONOSCOPY    . COLONOSCOPY W/ POLYPECTOMY  2011   tubular adenoma  . ESOPHAGOGASTRODUODENOSCOPY     Gastritis  . INSERT / REPLACE / REMOVE PACEMAKER  12/17/2012   MDT Adapta L implanted by Dr Rayann Heman for mobitz II second degree AV block  . KNEE SURGERY Right   . LEFT HEART CATHETERIZATION WITH CORONARY ANGIOGRAM N/A 10/17/2012   Procedure: LEFT HEART CATHETERIZATION WITH CORONARY ANGIOGRAM;  Surgeon: Josue Hector, MD;  Location: [facility] CATH LAB;  Service: Cardiovascular;  Laterality: N/A;  . LEFT HEART CATHETERIZATION WITH CORONARY ANGIOGRAM N/A 12/17/2012   Procedure: LEFT HEART CATHETERIZATION WITH CORONARY ANGIOGRAM;  Surgeon: Peter M Martinique, MD;  Location: [facility] CATH LAB;  Service: Cardiovascular;  Laterality: N/A;  . LEFT HEART CATHETERIZATION WITH CORONARY ANGIOGRAM N/A 06/11/2013   Procedure: LEFT HEART CATHETERIZATION WITH CORONARY ANGIOGRAM;  Surgeon: Wellington Hampshire, MD;  Location: [place] CATH LAB;  Service: Cardiovascular;  Laterality: N/A;  . PERCUTANEOUS CORONARY STENT INTERVENTION (PCI-S)  10/17/2012   Procedure: PERCUTANEOUS CORONARY STENT INTERVENTION (PCI-S);  Surgeon: Josue Hector, MD;  Location: [facility] CATH LAB;  Service: Cardiovascular;;  . PERMANENT PACEMAKER  INSERTION N/A 12/17/2012   Procedure: PERMANENT PACEMAKER INSERTION;  Surgeon: Thompson Grayer, MD;  Location: [facility] CATH LAB;  Service: Cardiovascular;  Laterality: N/A;  . POLYPECTOMY    . UPPER GASTROINTESTINAL ENDOSCOPY      There were no vitals filed for this visit.  Subjective Assessment - 02/14/18 1016    Subjective  PT states his back continues to hurt but thinks the exercises are slowly helping.  Reports compliance with HEP.    Currently in Pain?  Yes    Pain Score  4     Pain Location  Back    Pain Orientation  Lower;Left    Pain Descriptors / Indicators  Burning;Tingling;Aching          [facility] PT Assessment - 02/14/18 0001      Assessment   Medical Diagnosis  L sided sciatica, L leg pain    Referring Provider (PT)  Sallee Lange, MD      Observation/Other Assessments   Focus on Therapeutic Outcomes (FOTO)   FOTO limited 30%                   [facility] Adult PT Treatment/Exercise - 02/14/18 0001      Lumbar Exercises: Stretches   Single Knee to Chest Stretch  3 reps;30 seconds    Lower Trunk Rotation  5 reps;10 seconds    Piriformis Stretch  Right;Left;2 reps;30 seconds    Piriformis Stretch Limitations  seated    Figure 4 Stretch  3 reps;30 seconds      Lumbar Exercises: Supine   Ab Set  10 reps;5 seconds    Clam  10 reps    Bridge  10 reps      Lumbar Exercises: Sidelying   Hip Abduction  Both;10 reps             PT Education - 02/14/18 1016    Education Details  reviewed goals and HEP    Person(s) Educated  Patient    Methods  Explanation    Comprehension  Verbalized understanding       PT Short Term Goals - 02/14/18 0957      PT SHORT TERM GOAL #1   Title  Pt will have improved proximal hip MMT to 4+/5 in order to decrease LBP and improve functional mobility.    Time  3    Period  Weeks    Status  On-going      PT SHORT TERM GOAL #2   Title  Pt will have improved bil HS length to 150deg in order to demo improved flexibility and reduce LBP.    Time  3    Period  Weeks    Status  On-going      PT SHORT TERM GOAL #3   Title  Pt will be able to perform L SLS for 60sec without increases in L hip pain to demo improved core strength, L hip strength, and L hip endurance in order to maximize his ambulation.    Time  3    Period  Weeks    Status  On-going      PT SHORT TERM GOAL #4   Title  Pt will be able to sit for 10 mins comfortably to demo improved tolerance to this position and allow him to take a short drive comfortably.    Time  3    Period  Weeks    Status  On-going        PT Long Term Goals -  02/14/18 0958       PT LONG TERM GOAL #1   Title  Pt will have improved proximal hip MMT to 5/5 in order to further reduce LBP and LLE symptoms and maximize his tolerance to ambulation.    Time  6    Period  Weeks    Status  On-going      PT LONG TERM GOAL #2   Title  Pt will be able to sit for 30 mins comfortably to allow him to enjoy a meal with his family.    Time  6    Period  Weeks    Status  On-going      PT LONG TERM GOAL #3   Title  Pt will report being able to walk for 2 miles or longer with his wife and dogs without increases in pain in order to demo improved functional strength and tolerance to functional tasks to promote return to PLOF.     Time  6    Period  Weeks    Status  On-going      PT LONG TERM GOAL #4   Title  Pt will have reduced average daily pain to 2/10 or less to allow him to perform all functional and [place] activities with less pain and maximize return to PLOF.    Time  6    Period  Weeks    Status  On-going            Plan - 02/14/18 1208    Clinical Impression Statement  reviewed goals, completed FOTO.  PT able to complete HEP in correct form with minimal cues.  No pain or issues with any added therex this session.  Updated HEP as well this session.     Rehab Potential  Good    PT Frequency  2x / week    PT Duration  6 weeks    PT Treatment/Interventions  ADLs/Self Care Home Management;Aquatic Therapy;Cryotherapy;Electrical Stimulation;Moist Heat;Traction;Ultrasound;Gait training;Stair training;Functional mobility training;Therapeutic activities;Therapeutic exercise;Balance training;Neuromuscular re-education;Patient/family education;Manual techniques;Passive range of motion;Dry needling;Energy conservation;Taping;Spinal Manipulations;Joint Manipulations    PT Next Visit Plan  Progress functional strengthening.  Begin manual STM/spinal joint mobs (if with PT)if needed.       PT Home Exercise Plan  eval: supine HSS (hands behind knee), SKTC, supine figure 4 10/31:   piriformis stretch, bridge, clams, SLR    Consulted and Agree with Plan of Care  Patient       Patient will benefit from skilled therapeutic intervention in order to improve the following deficits and impairments:  Abnormal gait, Decreased activity tolerance, Decreased endurance, Decreased mobility, Decreased range of motion, Decreased strength, Difficulty walking, Hypomobility, Increased fascial restricitons, Increased muscle spasms, Impaired flexibility, Pain  Visit Diagnosis: Chronic bilateral low back pain, unspecified whether sciatica present  Pain in left hip  Muscle weakness (generalized)  Other symptoms and signs involving the musculoskeletal system     Problem List Patient Active Problem List   Diagnosis Date Noted  . Ischemic colitis ([place])   . Hematochezia 10/15/2017  . Hyperlipidemia 01/04/2016  . Anxiety as acute reaction to exceptional stress 05/28/2015  . OSA (obstructive sleep apnea) 10/16/2013  . Obstructive sleep apnea 08/25/2013  . BPH (benign prostatic hyperplasia) 08/25/2013  . Thoracic back pain 08/25/2013  . Chest pain, exertional 12/26/2012  . Intermediate coronary syndrome ([place]) 12/17/2012  . Bradycardia 12/17/2012  . Mobitz type II atrioventricular block 12/17/2012  . Coronary atherosclerosis of native coronary artery 10/18/2012  . S/P drug eluting  coronary stent placement 10/18/2012  . Exertional dyspnea 12/26/2011  . Abdominal  pain 12/26/2011  . Hypertension   . ADD (attention deficit disorder)   . Palpitations   . GERD (gastroesophageal reflux disease) 12/20/2011  . Hx of adenomatous colonic polyps 12/20/2011   Teena Irani, PTA/CLT [phone]  Teena Irani 02/14/2018, 12:11 PM  [place] [address] [place], [place], [address] Phone: [phone]   Fax:  [phone]  Name: OWENS HARA MRN: [id number] Date of Birth: 09/28/1955

## 2018-02-14 NOTE — Patient Instructions (Signed)
Abduction: Side Leg Lift (Eccentric) - Side-Lying    Lie on side. Lift top leg slightly higher than shoulder level. Keep top leg straight with body, toes pointing forward. Slowly lower for 3-5 seconds. _10__ reps per set,    Abduction / Adduction: Controlled Motion (Supine)   .move leg out to side slow and controlled.  Isolate so other Leg does not move and core is stabilized  Repeat _10__ times. Repeat with other leg. Do ___ sessions per day.    Straight Leg Raise    Tighten stomach and slowly raise locked right leg _15___ inches from floor. Repeat _10___ times per set.   Bridge [employer] small of back into mat, maintain pelvic tilt, roll up one vertebrae at a time. Focus on engaging posterior hip muscles. Hold for _5___ seconds. Repeat _10___ times.   Piriformis Stretch, Sitting    Sit, one ankle on opposite knee, same-side hand on crossed knee. Push down on knee, keeping spine straight. Lean torso forward, with flat back, until tension is felt in hamstrings and gluteals of crossed-leg side. Hold _30__ seconds.  Repeat _3__ times per session. Do _2__ sessions per day.

## 2018-02-18 ENCOUNTER — Telehealth (HOSPITAL_COMMUNITY): Payer: Self-pay

## 2018-02-18 NOTE — Telephone Encounter (Signed)
Pt requested to come in later in the day -he has another MD apptment in the morning hour. NF 02/18/18

## 2018-02-19 ENCOUNTER — Encounter (HOSPITAL_COMMUNITY): Payer: Self-pay

## 2018-02-19 ENCOUNTER — Ambulatory Visit (HOSPITAL_COMMUNITY): Payer: BLUE CROSS/BLUE SHIELD | Attending: Family Medicine

## 2018-02-19 DIAGNOSIS — G8929 Other chronic pain: Secondary | ICD-10-CM

## 2018-02-19 DIAGNOSIS — R29898 Other symptoms and signs involving the musculoskeletal system: Secondary | ICD-10-CM | POA: Insufficient documentation

## 2018-02-19 DIAGNOSIS — M25552 Pain in left hip: Secondary | ICD-10-CM | POA: Diagnosis not present

## 2018-02-19 DIAGNOSIS — M545 Low back pain, unspecified: Secondary | ICD-10-CM

## 2018-02-19 DIAGNOSIS — F4312 Post-traumatic stress disorder, chronic: Secondary | ICD-10-CM | POA: Diagnosis not present

## 2018-02-19 DIAGNOSIS — M6281 Muscle weakness (generalized): Secondary | ICD-10-CM | POA: Diagnosis not present

## 2018-02-19 NOTE — Therapy (Signed)
[place], [place], [address] Phone: [phone]   Fax:  [phone]  Physical Therapy Treatment  Patient Details  Name: [name] MRN: [id number] Date of Birth: [birth year] Referring Provider (PT): Sallee Lange, MD   Encounter Date: 02/19/2018  PT End of Session - 02/19/18 1520    Visit Number  3    Number of Visits  13    Date for PT Re-Evaluation  03/26/18   mini reassess 03/05/18   Authorization Type  BCBS Other    Authorization Time Period  02/12/18 to 03/26/18    Authorization - Visit Number  3    Authorization - Number of Visits  30   PT/OT/Chiro combined   PT Start Time  1519    Activity Tolerance  Patient tolerated treatment well    Behavior During Therapy  [facility] for tasks assessed/performed       Past Medical History:  Diagnosis Date  . ADD (attention deficit disorder)    Rx-Adderall  . Anginal pain ([place])   . AV block, 3rd degree (HCC)   . Colon polyps 2011   tubular adenoma by excisional biopsy during colonoscopy  . Complete heart block [facility])    a. s/p PPM Placement in 12/2012 with Medtronic CRT-P placement in 03/2014  . Coronary artery disease    a. s/p PCI of the RCA in 2014  . Degenerative disc disease, cervical   . Gastrointestinal bleeding 10/15/2017  . GERD (gastroesophageal reflux disease)   . Hypertension   . Pacemaker   . Palpitations 2003   Holter in 2003: PACs and PVCs; negative stress nuclear test in 2005; right bundle branch block; echo in 2008-mild LVH, otherwise normal; 2008-negative stress nuclear test.  . Pneumonia   . Prostatitis    prostate calcifications by CT  . Sleep apnea    questionable diagnosis    Past Surgical History:  Procedure Laterality Date  . ANTERIOR CERVICAL DECOMP/DISCECTOMY FUSION    . BACK SURGERY    . Biventricular pacemaker upgrade/ system revision  04/02/14   removal of previous atrial and ventricular leads with placement of a new MDT  Consulta CRT-P system at [facility] by Dr Violet Baldy  . COLONOSCOPY    . COLONOSCOPY W/ POLYPECTOMY  2011   tubular adenoma  . ESOPHAGOGASTRODUODENOSCOPY     Gastritis  . INSERT / REPLACE / REMOVE PACEMAKER  12/17/2012   MDT Adapta L implanted by Dr Rayann Heman for mobitz II second degree AV block  . KNEE SURGERY Right   . LEFT HEART CATHETERIZATION WITH CORONARY ANGIOGRAM N/A 10/17/2012   Procedure: LEFT HEART CATHETERIZATION WITH CORONARY ANGIOGRAM;  Surgeon: Josue Hector, MD;  Location: [facility] CATH LAB;  Service: Cardiovascular;  Laterality: N/A;  . LEFT HEART CATHETERIZATION WITH CORONARY ANGIOGRAM N/A 12/17/2012   Procedure: LEFT HEART CATHETERIZATION WITH CORONARY ANGIOGRAM;  Surgeon: Peter M Martinique, MD;  Location: [facility] CATH LAB;  Service: Cardiovascular;  Laterality: N/A;  . LEFT HEART CATHETERIZATION WITH CORONARY ANGIOGRAM N/A 06/11/2013   Procedure: LEFT HEART CATHETERIZATION WITH CORONARY ANGIOGRAM;  Surgeon: Wellington Hampshire, MD;  Location: [place] CATH LAB;  Service: Cardiovascular;  Laterality: N/A;  . PERCUTANEOUS CORONARY STENT INTERVENTION (PCI-S)  10/17/2012   Procedure: PERCUTANEOUS CORONARY STENT INTERVENTION (PCI-S);  Surgeon: Josue Hector, MD;  Location: [facility] CATH LAB;  Service: Cardiovascular;;  . PERMANENT PACEMAKER INSERTION N/A 12/17/2012   Procedure: PERMANENT PACEMAKER INSERTION;  Surgeon: Thompson Grayer, MD;  Location: [facility] CATH  LAB;  Service: Cardiovascular;  Laterality: N/A;  . POLYPECTOMY    . UPPER GASTROINTESTINAL ENDOSCOPY      There were no vitals filed for this visit.  Subjective Assessment - 02/19/18 1521    Subjective  Pt reports that his HEP helps while he is doing them and then he feels better for about 71mins and then his pain comes back. Currently his L hip pain is about 6/10, with L foot numbness which has been presnet for a while today.    Currently in Pain?  Yes    Pain Score  6     Pain Location  Hip    Pain Orientation  Left    Pain Descriptors /  Indicators  Burning;Aching;Tingling    Pain Type  Chronic pain    Pain Radiating Towards  L foot asleep    Pain Onset  More than a month ago    Pain Frequency  Constant    Aggravating Factors   sitting    Pain Relieving Factors  walking some, meds    Effect of Pain on Daily Activities  increases          [facility] Adult PT Treatment/Exercise - 02/19/18 0001      Exercises   Exercises  Lumbar      Lumbar Exercises: Stretches   Double Knee to Chest Stretch  3 reps;30 seconds    Lower Trunk Rotation  5 reps;10 seconds      Lumbar Exercises: Standing   Other Standing Lumbar Exercises  bil SLS vectors x5RT    Other Standing Lumbar Exercises  REIS x10 reps      Lumbar Exercises: Seated   Sit to Stand  10 reps      Lumbar Exercises: Supine   Bridge  10 reps    Bridge Limitations  2 sets      Lumbar Exercises: Sidelying   Clam  Left;10 reps    Clam Limitations  3 sets; 2sets with GTB, 2nd set with belt x10" holds      Lumbar Exercises: Prone   Other Prone Lumbar Exercises  POE x90mins    Other Prone Lumbar Exercises  prone press-ups x10reps      Manual Therapy   Manual Therapy  Joint mobilization;Soft tissue mobilization    Manual therapy comments  separate rest of treatment    Joint Mobilization  central PAs to thoracic and lumbar spine Grade II-III for pain control and mobility    Soft tissue mobilization  STM with green weighted ball to L lumbar paraspinals/distal lats, and L glutes to reduce restrictions and pain           PT Education - 02/19/18 1521    Education Details  continue HEP    Person(s) Educated  Patient    Methods  Explanation;Demonstration    Comprehension  Verbalized understanding       PT Short Term Goals - 02/14/18 0957      PT SHORT TERM GOAL #1   Title  Pt will have improved proximal hip MMT to 4+/5 in order to decrease LBP and improve functional mobility.    Time  3    Period  Weeks    Status  On-going      PT SHORT TERM GOAL #2   Title   Pt will have improved bil HS length to 150deg in order to demo improved flexibility and reduce LBP.    Time  3    Period  Weeks  Status  On-going      PT SHORT TERM GOAL #3   Title  Pt will be able to perform L SLS for 60sec without increases in L hip pain to demo improved core strength, L hip strength, and L hip endurance in order to maximize his ambulation.    Time  3    Period  Weeks    Status  On-going      PT SHORT TERM GOAL #4   Title  Pt will be able to sit for 10 mins comfortably to demo improved tolerance to this position and allow him to take a short drive comfortably.    Time  3    Period  Weeks    Status  On-going        PT Long Term Goals - 02/14/18 4481      PT LONG TERM GOAL #1   Title  Pt will have improved proximal hip MMT to 5/5 in order to further reduce LBP and LLE symptoms and maximize his tolerance to ambulation.    Time  6    Period  Weeks    Status  On-going      PT LONG TERM GOAL #2   Title  Pt will be able to sit for 30 mins comfortably to allow him to enjoy a meal with his family.    Time  6    Period  Weeks    Status  On-going      PT LONG TERM GOAL #3   Title  Pt will report being able to walk for 2 miles or longer with his wife and dogs without increases in pain in order to demo improved functional strength and tolerance to functional tasks to promote return to PLOF.     Time  6    Period  Weeks    Status  On-going      PT LONG TERM GOAL #4   Title  Pt will have reduced average daily pain to 2/10 or less to allow him to perform all functional and [place] activities with less pain and maximize return to PLOF.    Time  6    Period  Weeks    Status  On-going            Plan - 02/19/18 1607    Clinical Impression Statement  Continued with established POC focusing on general lumbar mobility, hip and core strengthening, and address mobility and soft tissue restrictions. Added POE and prone press ups to POC and pt stating that he does POE  about 1x/week already as it stretches his back; PT educated him to add it in with his other HEP provided in therapy. Pt challenged with sidelying clams and stated that he could feel the isometric clams in his spot of pain in L hip. Ended with manual IASTM with green ball to L hip, L lumbar paraspinals/distal lats and joint mobs to thoracic and lumbar spine for mobility. Pt reported feeling looser at EOS to 4/10 (was 6/10 at beginning). Continue as planned, progressing as able.     Clinical Presentation  Stable    Clinical Decision Making  Low    Rehab Potential  Good    PT Frequency  2x / week    PT Duration  6 weeks    PT Treatment/Interventions  ADLs/Self Care Home Management;Aquatic Therapy;Cryotherapy;Electrical Stimulation;Moist Heat;Traction;Ultrasound;Gait training;Stair training;Functional mobility training;Therapeutic activities;Therapeutic exercise;Balance training;Neuromuscular re-education;Patient/family education;Manual techniques;Passive range of motion;Dry needling;Energy conservation;Taping;Spinal Manipulations;Joint Manipulations    PT Next  Visit Plan  Continue progress functional strengthening; initiate thoracic mobility work; continue manual STM/spinal joint mobs (if with PT)    PT Home Exercise Plan  eval: supine HSS (hands behind knee), SKTC, supine figure 4 10/31:  piriformis stretch, bridge, clams, SLR; 11/5: POE    Consulted and Agree with Plan of Care  Patient       Patient will benefit from skilled therapeutic intervention in order to improve the following deficits and impairments:  Abnormal gait, Decreased activity tolerance, Decreased endurance, Decreased mobility, Decreased range of motion, Decreased strength, Difficulty walking, Hypomobility, Increased fascial restricitons, Increased muscle spasms, Impaired flexibility, Pain  Visit Diagnosis: Chronic bilateral low back pain, unspecified whether sciatica present  Pain in left hip  Muscle weakness  (generalized)  Other symptoms and signs involving the musculoskeletal system     Problem List Patient Active Problem List   Diagnosis Date Noted  . Ischemic colitis ([place])   . Hematochezia 10/15/2017  . Hyperlipidemia 01/04/2016  . Anxiety as acute reaction to exceptional stress 05/28/2015  . OSA (obstructive sleep apnea) 10/16/2013  . Obstructive sleep apnea 08/25/2013  . BPH (benign prostatic hyperplasia) 08/25/2013  . Thoracic back pain 08/25/2013  . Chest pain, exertional 12/26/2012  . Intermediate coronary syndrome ([place]) 12/17/2012  . Bradycardia 12/17/2012  . Mobitz type II atrioventricular block 12/17/2012  . Coronary atherosclerosis of native coronary artery 10/18/2012  . S/P drug eluting coronary stent placement 10/18/2012  . Exertional dyspnea 12/26/2011  . Abdominal  pain 12/26/2011  . Hypertension   . ADD (attention deficit disorder)   . Palpitations   . GERD (gastroesophageal reflux disease) 12/20/2011  . Hx of adenomatous colonic polyps 12/20/2011        Geraldine Solar PT, DPT  [facility] [address] [place], [place], [address] Phone: [phone]   Fax:  [phone]  Name: [name] MRN: [id number] Date of Birth: Aug 25, 1955

## 2018-02-21 ENCOUNTER — Encounter (HOSPITAL_COMMUNITY): Payer: Self-pay

## 2018-02-21 ENCOUNTER — Ambulatory Visit (HOSPITAL_COMMUNITY): Payer: BLUE CROSS/BLUE SHIELD

## 2018-02-21 ENCOUNTER — Telehealth: Payer: Self-pay | Admitting: Student

## 2018-02-21 DIAGNOSIS — R29898 Other symptoms and signs involving the musculoskeletal system: Secondary | ICD-10-CM | POA: Diagnosis not present

## 2018-02-21 DIAGNOSIS — M545 Low back pain, unspecified: Secondary | ICD-10-CM

## 2018-02-21 DIAGNOSIS — M6281 Muscle weakness (generalized): Secondary | ICD-10-CM

## 2018-02-21 DIAGNOSIS — M25552 Pain in left hip: Secondary | ICD-10-CM | POA: Diagnosis not present

## 2018-02-21 DIAGNOSIS — G8929 Other chronic pain: Secondary | ICD-10-CM | POA: Diagnosis not present

## 2018-02-21 MED ORDER — HYDRALAZINE HCL 50 MG PO TABS: 50.0000 mg | ORAL_TABLET | Freq: Two times a day (BID) | ORAL | 1 refills | Status: DC

## 2018-02-21 NOTE — Telephone Encounter (Signed)
Refill escribed

## 2018-02-21 NOTE — Telephone Encounter (Signed)
Per pt phone call-- pt has not received his Rx for his hydrALAZINE (APRESOLINE) 50 MG tablet [[id number]]   He needs it sent to [facility]

## 2018-02-21 NOTE — Therapy (Signed)
[place], [place], [address] Phone: [phone]   Fax:  [phone]  Physical Therapy Treatment  Patient Details  Name: [name] MRN: [id number] Date of Birth: 1955-09-27 Referring Provider (PT): Sallee Lange, MD   Encounter Date: 02/21/2018  PT End of Session - 02/21/18 0908    Visit Number  4    Number of Visits  13    Date for PT Re-Evaluation  03/26/18   Minireassess 03/05/18   Authorization Type  BCBS Other    Authorization Time Period  02/12/18 to 03/26/18    Authorization - Visit Number  4    Authorization - Number of Visits  30   PT/OT/Chiro combined   PT Start Time  3825    PT Stop Time  0927    PT Time Calculation (min)  30 min    Activity Tolerance  Patient tolerated treatment well;Patient limited by pain   limited by hernia pain   Behavior During Therapy  [facility] for tasks assessed/performed       Past Medical History:  Diagnosis Date  . ADD (attention deficit disorder)    Rx-Adderall  . Anginal pain ([place])   . AV block, 3rd degree (HCC)   . Colon polyps 2011   tubular adenoma by excisional biopsy during colonoscopy  . Complete heart block [facility])    a. s/p PPM Placement in 12/2012 with Medtronic CRT-P placement in 03/2014  . Coronary artery disease    a. s/p PCI of the RCA in 2014  . Degenerative disc disease, cervical   . Gastrointestinal bleeding 10/15/2017  . GERD (gastroesophageal reflux disease)   . Hypertension   . Pacemaker   . Palpitations 2003   Holter in 2003: PACs and PVCs; negative stress nuclear test in 2005; right bundle branch block; echo in 2008-mild LVH, otherwise normal; 2008-negative stress nuclear test.  . Pneumonia   . Prostatitis    prostate calcifications by CT  . Sleep apnea    questionable diagnosis    Past Surgical History:  Procedure Laterality Date  . ANTERIOR CERVICAL DECOMP/DISCECTOMY FUSION    . BACK SURGERY    . Biventricular pacemaker  upgrade/ system revision  04/02/14   removal of previous atrial and ventricular leads with placement of a new MDT Consulta CRT-P system at [facility] by Dr Violet Baldy  . COLONOSCOPY    . COLONOSCOPY W/ POLYPECTOMY  2011   tubular adenoma  . ESOPHAGOGASTRODUODENOSCOPY     Gastritis  . INSERT / REPLACE / REMOVE PACEMAKER  12/17/2012   MDT Adapta L implanted by Dr Rayann Heman for mobitz II second degree AV block  . KNEE SURGERY Right   . LEFT HEART CATHETERIZATION WITH CORONARY ANGIOGRAM N/A 10/17/2012   Procedure: LEFT HEART CATHETERIZATION WITH CORONARY ANGIOGRAM;  Surgeon: Josue Hector, MD;  Location: [facility] CATH LAB;  Service: Cardiovascular;  Laterality: N/A;  . LEFT HEART CATHETERIZATION WITH CORONARY ANGIOGRAM N/A 12/17/2012   Procedure: LEFT HEART CATHETERIZATION WITH CORONARY ANGIOGRAM;  Surgeon: Peter M Martinique, MD;  Location: [facility] CATH LAB;  Service: Cardiovascular;  Laterality: N/A;  . LEFT HEART CATHETERIZATION WITH CORONARY ANGIOGRAM N/A 06/11/2013   Procedure: LEFT HEART CATHETERIZATION WITH CORONARY ANGIOGRAM;  Surgeon: Wellington Hampshire, MD;  Location: [place] CATH LAB;  Service: Cardiovascular;  Laterality: N/A;  . PERCUTANEOUS CORONARY STENT INTERVENTION (PCI-S)  10/17/2012   Procedure: PERCUTANEOUS CORONARY STENT INTERVENTION (PCI-S);  Surgeon: Josue Hector, MD;  Location: [facility] CATH LAB;  Service: Cardiovascular;;  . PERMANENT PACEMAKER INSERTION N/A 12/17/2012   Procedure: PERMANENT PACEMAKER INSERTION;  Surgeon: Thompson Grayer, MD;  Location: [facility] CATH LAB;  Service: Cardiovascular;  Laterality: N/A;  . POLYPECTOMY    . UPPER GASTROINTESTINAL ENDOSCOPY      There were no vitals filed for this visit.  Subjective Assessment - 02/21/18 0903    Subjective  Pt stated he is limited more by his hernia than LBP or radicular symptoms today.  Current pain scale 4/10 and numbness/tingling down Lt LE ending at knees.    Patient Stated Goals  pain relief and stretching out    Currently in Pain?   Yes    Pain Score  4     Pain Location  Back    Pain Orientation  Lower    Pain Descriptors / Indicators  Aching;Tightness;Burning    Pain Type  Chronic pain    Pain Radiating Towards  radicular symptoms ending Lt knee/pain in Lt hip,     Pain Onset  More than a month ago    Pain Frequency  Constant    Aggravating Factors   sitting    Pain Relieving Factors  walking some, meds    Effect of Pain on Daily Activities  increases                       [facility] Adult PT Treatment/Exercise - 02/21/18 0001      Exercises   Exercises  Lumbar      Lumbar Exercises: Stretches   Active Hamstring Stretch  3 reps;30 seconds    Active Hamstring Stretch Limitations  supine with rope    Double Knee to Chest Stretch Limitations  held due to hernia pain    Lower Trunk Rotation  5 reps;10 seconds      Lumbar Exercises: Standing   Other Standing Lumbar Exercises  REIS x10 reps      Lumbar Exercises: Seated   Sit to Stand  10 reps    Other Seated Lumbar Exercises  3D thoracic excursion (reports hernia pain with sidebend)      Lumbar Exercises: Supine   Bridge  10 reps    Bridge Limitations  2 sets      Lumbar Exercises: Sidelying   Clam  Left;10 reps    Clam Limitations  2nd set with belt x10" holds               PT Short Term Goals - 02/14/18 0957      PT SHORT TERM GOAL #1   Title  Pt will have improved proximal hip MMT to 4+/5 in order to decrease LBP and improve functional mobility.    Time  3    Period  Weeks    Status  On-going      PT SHORT TERM GOAL #2   Title  Pt will have improved bil HS length to 150deg in order to demo improved flexibility and reduce LBP.    Time  3    Period  Weeks    Status  On-going      PT SHORT TERM GOAL #3   Title  Pt will be able to perform L SLS for 60sec without increases in L hip pain to demo improved core strength, L hip strength, and L hip endurance in order to maximize his ambulation.    Time  3    Period  Weeks     Status  On-going  PT SHORT TERM GOAL #4   Title  Pt will be able to sit for 10 mins comfortably to demo improved tolerance to this position and allow him to take a short drive comfortably.    Time  3    Period  Weeks    Status  On-going        PT Long Term Goals - 02/14/18 8546      PT LONG TERM GOAL #1   Title  Pt will have improved proximal hip MMT to 5/5 in order to further reduce LBP and LLE symptoms and maximize his tolerance to ambulation.    Time  6    Period  Weeks    Status  On-going      PT LONG TERM GOAL #2   Title  Pt will be able to sit for 30 mins comfortably to allow him to enjoy a meal with his family.    Time  6    Period  Weeks    Status  On-going      PT LONG TERM GOAL #3   Title  Pt will report being able to walk for 2 miles or longer with his wife and dogs without increases in pain in order to demo improved functional strength and tolerance to functional tasks to promote return to PLOF.     Time  6    Period  Weeks    Status  On-going      PT LONG TERM GOAL #4   Title  Pt will have reduced average daily pain to 2/10 or less to allow him to perform all functional and [place] activities with less pain and maximize return to PLOF.    Time  6    Period  Weeks    Status  On-going            Plan - 02/21/18 0916    Clinical Impression Statement  Continued with established POC focusing on spinal mobility and hip/core strengthening.  Pt limited with flexion based stretches due to hernia pain so held the [facility] stretch this session.  Pt challenged with isometric clams with reports of increased pain Lt hip.   Attempted 3D thoracic excursion to improve thoracic mobility, pt limited by hernia pain with task.  Pt unable to tolerate prone position this session and requested to leave session early.  No reports of increased LBP, pain scale 4/10 Lt hip at EOS, pain in hernia 7/10.      Rehab Potential  Good    PT Frequency  2x / week    PT Duration  6 weeks    PT  Treatment/Interventions  ADLs/Self Care Home Management;Aquatic Therapy;Cryotherapy;Electrical Stimulation;Moist Heat;Traction;Ultrasound;Gait training;Stair training;Functional mobility training;Therapeutic activities;Therapeutic exercise;Balance training;Neuromuscular re-education;Patient/family education;Manual techniques;Passive range of motion;Dry needling;Energy conservation;Taping;Spinal Manipulations;Joint Manipulations    PT Next Visit Plan  Continue progress functional strengthening; initiate thoracic mobility work; continue manual STM/spinal joint mobs (if with PT)    PT Home Exercise Plan  eval: supine HSS (hands behind knee), SKTC, supine figure 4 10/31:  piriformis stretch, bridge, clams, SLR; 11/5: POE       Patient will benefit from skilled therapeutic intervention in order to improve the following deficits and impairments:  Abnormal gait, Decreased activity tolerance, Decreased endurance, Decreased mobility, Decreased range of motion, Decreased strength, Difficulty walking, Hypomobility, Increased fascial restricitons, Increased muscle spasms, Impaired flexibility, Pain  Visit Diagnosis: Chronic bilateral low back pain, unspecified whether sciatica present  Pain in left hip  Muscle weakness (generalized)  Other symptoms and signs involving the musculoskeletal system     Problem List Patient Active Problem List   Diagnosis Date Noted  . Ischemic colitis ([place])   . Hematochezia 10/15/2017  . Hyperlipidemia 01/04/2016  . Anxiety as acute reaction to exceptional stress 05/28/2015  . OSA (obstructive sleep apnea) 10/16/2013  . Obstructive sleep apnea 08/25/2013  . BPH (benign prostatic hyperplasia) 08/25/2013  . Thoracic back pain 08/25/2013  . Chest pain, exertional 12/26/2012  . Intermediate coronary syndrome ([place]) 12/17/2012  . Bradycardia 12/17/2012  . Mobitz type II atrioventricular block 12/17/2012  . Coronary atherosclerosis of native coronary artery 10/18/2012   . S/P drug eluting coronary stent placement 10/18/2012  . Exertional dyspnea 12/26/2011  . Abdominal  pain 12/26/2011  . Hypertension   . ADD (attention deficit disorder)   . Palpitations   . GERD (gastroesophageal reflux disease) 12/20/2011  . Hx of adenomatous colonic polyps 12/20/2011   Ihor Austin, LPTA; CBIS [phone]  Aldona Lento 02/21/2018, 9:31 AM  [place], [place], [address] Phone: [phone]   Fax:  [phone]  Name: [name] MRN: [id number] Date of Birth: 24-Mar-1956

## 2018-02-26 ENCOUNTER — Other Ambulatory Visit: Payer: Self-pay

## 2018-02-26 ENCOUNTER — Ambulatory Visit (HOSPITAL_COMMUNITY): Payer: BLUE CROSS/BLUE SHIELD

## 2018-02-26 ENCOUNTER — Encounter (HOSPITAL_COMMUNITY): Payer: Self-pay

## 2018-02-26 DIAGNOSIS — G8929 Other chronic pain: Secondary | ICD-10-CM | POA: Diagnosis not present

## 2018-02-26 DIAGNOSIS — M545 Low back pain, unspecified: Secondary | ICD-10-CM

## 2018-02-26 DIAGNOSIS — M25552 Pain in left hip: Secondary | ICD-10-CM

## 2018-02-26 DIAGNOSIS — R29898 Other symptoms and signs involving the musculoskeletal system: Secondary | ICD-10-CM

## 2018-02-26 DIAGNOSIS — M6281 Muscle weakness (generalized): Secondary | ICD-10-CM | POA: Diagnosis not present

## 2018-02-26 NOTE — Therapy (Signed)
[place], [place], [address] Phone: [phone]   Fax:  [phone]  Physical Therapy Treatment  Patient Details  Name: [name] MRN: [id number] Date of Birth: 07-10-1955 Referring Provider (PT): Sallee Lange, MD   Encounter Date: 02/26/2018  PT End of Session - 02/26/18 1147    Visit Number  5    Number of Visits  13    Date for PT Re-Evaluation  03/26/18   Minireassess 03/05/18   Authorization Type  BCBS Other    Authorization Time Period  02/12/18 to 03/26/18    Authorization - Visit Number  5    Authorization - Number of Visits  30   PT/OT/Chiro combined   PT Start Time  1123    PT Stop Time  1203    PT Time Calculation (min)  40 min    Activity Tolerance  Patient tolerated treatment well;Patient limited by pain   limited by hernia pain   Behavior During Therapy  [facility] for tasks assessed/performed       Past Medical History:  Diagnosis Date  . ADD (attention deficit disorder)    Rx-Adderall  . Anginal pain ([place])   . AV block, 3rd degree (HCC)   . Colon polyps 2011   tubular adenoma by excisional biopsy during colonoscopy  . Complete heart block [facility])    a. s/p PPM Placement in 12/2012 with Medtronic CRT-P placement in 03/2014  . Coronary artery disease    a. s/p PCI of the RCA in 2014  . Degenerative disc disease, cervical   . Gastrointestinal bleeding 10/15/2017  . GERD (gastroesophageal reflux disease)   . Hypertension   . Pacemaker   . Palpitations 2003   Holter in 2003: PACs and PVCs; negative stress nuclear test in 2005; right bundle branch block; echo in 2008-mild LVH, otherwise normal; 2008-negative stress nuclear test.  . Pneumonia   . Prostatitis    prostate calcifications by CT  . Sleep apnea    questionable diagnosis    Past Surgical History:  Procedure Laterality Date  . ANTERIOR CERVICAL DECOMP/DISCECTOMY FUSION    . BACK SURGERY    . Biventricular pacemaker  upgrade/ system revision  04/02/14   removal of previous atrial and ventricular leads with placement of a new MDT Consulta CRT-P system at [facility] by Dr Violet Baldy  . COLONOSCOPY    . COLONOSCOPY W/ POLYPECTOMY  2011   tubular adenoma  . ESOPHAGOGASTRODUODENOSCOPY     Gastritis  . INSERT / REPLACE / REMOVE PACEMAKER  12/17/2012   MDT Adapta L implanted by Dr Rayann Heman for mobitz II second degree AV block  . KNEE SURGERY Right   . LEFT HEART CATHETERIZATION WITH CORONARY ANGIOGRAM N/A 10/17/2012   Procedure: LEFT HEART CATHETERIZATION WITH CORONARY ANGIOGRAM;  Surgeon: Josue Hector, MD;  Location: [facility] CATH LAB;  Service: Cardiovascular;  Laterality: N/A;  . LEFT HEART CATHETERIZATION WITH CORONARY ANGIOGRAM N/A 12/17/2012   Procedure: LEFT HEART CATHETERIZATION WITH CORONARY ANGIOGRAM;  Surgeon: Peter M Martinique, MD;  Location: [facility] CATH LAB;  Service: Cardiovascular;  Laterality: N/A;  . LEFT HEART CATHETERIZATION WITH CORONARY ANGIOGRAM N/A 06/11/2013   Procedure: LEFT HEART CATHETERIZATION WITH CORONARY ANGIOGRAM;  Surgeon: Wellington Hampshire, MD;  Location: [place] CATH LAB;  Service: Cardiovascular;  Laterality: N/A;  . PERCUTANEOUS CORONARY STENT INTERVENTION (PCI-S)  10/17/2012   Procedure: PERCUTANEOUS CORONARY STENT INTERVENTION (PCI-S);  Surgeon: Josue Hector, MD;  Location: [facility] CATH LAB;  Service: Cardiovascular;;  . PERMANENT PACEMAKER INSERTION N/A 12/17/2012   Procedure: PERMANENT PACEMAKER INSERTION;  Surgeon: Thompson Grayer, MD;  Location: [facility] CATH LAB;  Service: Cardiovascular;  Laterality: N/A;  . POLYPECTOMY    . UPPER GASTROINTESTINAL ENDOSCOPY      There were no vitals filed for this visit.  Subjective Assessment - 02/26/18 1126    Subjective  Patient reports he had a good weekend and [place]. He reports he is trying to do his exercises regularly but he has pain from his bilateral hernias and sometimes this limits him.    Limitations  Sitting;House hold  activities    Patient Stated Goals  pain relief and stretching out    Currently in Pain?  Yes    Pain Score  5     Pain Location  Back    Pain Orientation  Lower;Left    Pain Descriptors / Indicators  Aching;Burning    Pain Type  Chronic pain    Pain Radiating Towards  radicular symptoms into Lt leg, no further than knee    Pain Onset  More than a month ago    Pain Frequency  Constant    Aggravating Factors   sitting    Pain Relieving Factors  walking        [facility] Adult PT Treatment/Exercise - 02/26/18 0001      Exercises   Exercises  Lumbar      Lumbar Exercises: Stretches   Active Hamstring Stretch  3 reps;30 seconds;Left    Active Hamstring Stretch Limitations  supine with rope    Single Knee to Chest Stretch  5 reps;Right;Left;10 seconds    Lower Trunk Rotation  5 reps;10 seconds;Limitations    Lower Trunk Rotation Limitations  Rt/Lt    Prone on Elbows Stretch  10 seconds;Limitations    Prone on Elbows Stretch Limitations  10 reps    Press Ups  10 reps;10 seconds      Lumbar Exercises: Standing   Functional Squats  10 reps;Limitations    Functional Squats Limitations  chair taps      Lumbar Exercises: Supine   Bridge  15 reps;3 seconds    Bridge Limitations  2 sets      Lumbar Exercises: Sidelying   Clam  Both;15 reps;3 seconds;Limitations    Clam Limitations  2 sets, red theraband      Lumbar Exercises: Quadruped   Other Quadruped Lumbar Exercises  Thoracic opener: open book, 10x bil      Manual Therapy   Manual Therapy  Joint mobilization    Manual therapy comments  separate rest of treatment    Joint Mobilization  central PA's to T10-L5, grade III for mobility        PT Education - 02/26/18 1154    Education Details  Educated on exercises and importance of HEP participation.     Person(s) Educated  Patient    Methods  Explanation    Comprehension  Verbalized understanding       PT Short Term Goals - 02/14/18 0957      PT SHORT TERM GOAL #1    Title  Pt will have improved proximal hip MMT to 4+/5 in order to decrease LBP and improve functional mobility.    Time  3    Period  Weeks    Status  On-going      PT SHORT TERM GOAL #2   Title  Pt will have improved bil HS length to 150deg in  order to demo improved flexibility and reduce LBP.    Time  3    Period  Weeks    Status  On-going      PT SHORT TERM GOAL #3   Title  Pt will be able to perform L SLS for 60sec without increases in L hip pain to demo improved core strength, L hip strength, and L hip endurance in order to maximize his ambulation.    Time  3    Period  Weeks    Status  On-going      PT SHORT TERM GOAL #4   Title  Pt will be able to sit for 10 mins comfortably to demo improved tolerance to this position and allow him to take a short drive comfortably.    Time  3    Period  Weeks    Status  On-going        PT Long Term Goals - 02/14/18 1610      PT LONG TERM GOAL #1   Title  Pt will have improved proximal hip MMT to 5/5 in order to further reduce LBP and LLE symptoms and maximize his tolerance to ambulation.    Time  6    Period  Weeks    Status  On-going      PT LONG TERM GOAL #2   Title  Pt will be able to sit for 30 mins comfortably to allow him to enjoy a meal with his family.    Time  6    Period  Weeks    Status  On-going      PT LONG TERM GOAL #3   Title  Pt will report being able to walk for 2 miles or longer with his wife and dogs without increases in pain in order to demo improved functional strength and tolerance to functional tasks to promote return to PLOF.     Time  6    Period  Weeks    Status  On-going      PT LONG TERM GOAL #4   Title  Pt will have reduced average daily pain to 2/10 or less to allow him to perform all functional and [place] activities with less pain and maximize return to PLOF.    Time  6    Period  Weeks    Status  On-going        Plan - 02/26/18 1148    Clinical Impression Statement  Continued with current POC  focusing on spinal mobility and hip strengthening. Patient progressed stretches today with SKTC and thoracic opener in quadruped. He reported some discomfort in Lt hip from hernia but it did not limit him from completing exercises. Continued with manual therapy to address mobility in lumbar spine, patient able to perform prone on elbows and prone press up this session to further facilitate lumbar extension mobility. Chair taps with functional squats added for LE strengthening. He will continue to benefit from skilled PT interventions to address impairments and progress towards goals    Rehab Potential  Good    PT Frequency  2x / week    PT Duration  6 weeks    PT Treatment/Interventions  ADLs/Self Care Home Management;Aquatic Therapy;Cryotherapy;Electrical Stimulation;Moist Heat;Traction;Ultrasound;Gait training;Stair training;Functional mobility training;Therapeutic activities;Therapeutic exercise;Balance training;Neuromuscular re-education;Patient/family education;Manual techniques;Passive range of motion;Dry needling;Energy conservation;Taping;Spinal Manipulations;Joint Manipulations    PT Next Visit Plan  Continue with thoracic mobility with open book in quadruped. Continue manual and spinal mobility with PT PRN. Progress functional LE strengthening  and core exercises. Consider adding sciatic nerve glide for Lt LE.    PT Home Exercise Plan  eval: supine HSS (hands behind knee), SKTC, supine figure 4 10/31:  piriformis stretch, bridge, clams, SLR; 11/5: POE    Consulted and Agree with Plan of Care  Patient       Patient will benefit from skilled therapeutic intervention in order to improve the following deficits and impairments:  Abnormal gait, Decreased activity tolerance, Decreased endurance, Decreased mobility, Decreased range of motion, Decreased strength, Difficulty walking, Hypomobility, Increased fascial restricitons, Increased muscle spasms, Impaired flexibility, Pain  Visit  Diagnosis: Chronic bilateral low back pain, unspecified whether sciatica present  Pain in left hip  Muscle weakness (generalized)  Other symptoms and signs involving the musculoskeletal system     Problem List Patient Active Problem List   Diagnosis Date Noted  . Ischemic colitis ([place])   . Hematochezia 10/15/2017  . Hyperlipidemia 01/04/2016  . Anxiety as acute reaction to exceptional stress 05/28/2015  . OSA (obstructive sleep apnea) 10/16/2013  . Obstructive sleep apnea 08/25/2013  . BPH (benign prostatic hyperplasia) 08/25/2013  . Thoracic back pain 08/25/2013  . Chest pain, exertional 12/26/2012  . Intermediate coronary syndrome ([place]) 12/17/2012  . Bradycardia 12/17/2012  . Mobitz type II atrioventricular block 12/17/2012  . Coronary atherosclerosis of native coronary artery 10/18/2012  . S/P drug eluting coronary stent placement 10/18/2012  . Exertional dyspnea 12/26/2011  . Abdominal  pain 12/26/2011  . Hypertension   . ADD (attention deficit disorder)   . Palpitations   . GERD (gastroesophageal reflux disease) 12/20/2011  . Hx of adenomatous colonic polyps 12/20/2011    Kipp Brood, PT, DPT Physical Therapist with [facility]  02/26/2018 12:11 PM    [place], [place], [address] Phone: [phone]   Fax:  [phone]  Name: [name] MRN: [id number] Date of Birth: 09-28-1955

## 2018-02-28 ENCOUNTER — Ambulatory Visit (HOSPITAL_COMMUNITY): Payer: BLUE CROSS/BLUE SHIELD

## 2018-02-28 ENCOUNTER — Encounter (HOSPITAL_COMMUNITY): Payer: Self-pay

## 2018-02-28 DIAGNOSIS — M545 Low back pain, unspecified: Secondary | ICD-10-CM

## 2018-02-28 DIAGNOSIS — G8929 Other chronic pain: Secondary | ICD-10-CM

## 2018-02-28 DIAGNOSIS — R29898 Other symptoms and signs involving the musculoskeletal system: Secondary | ICD-10-CM

## 2018-02-28 DIAGNOSIS — M6281 Muscle weakness (generalized): Secondary | ICD-10-CM | POA: Diagnosis not present

## 2018-02-28 DIAGNOSIS — M25552 Pain in left hip: Secondary | ICD-10-CM

## 2018-02-28 NOTE — Therapy (Signed)
[place], [place], [address] Phone: [phone]   Fax:  [phone]  Physical Therapy Treatment  Patient Details  Name: [name] MRN: [id number] Date of Birth: 11/29/1955 Referring Provider (PT): Sallee Lange, MD   Encounter Date: 02/28/2018  PT End of Session - 02/28/18 0944    Visit Number  6    Number of Visits  13    Date for PT Re-Evaluation  03/26/18   Minireassess 03/05/18   Authorization Type  BCBS Other    Authorization Time Period  02/12/18 to 03/26/18    Authorization - Visit Number  6    Authorization - Number of Visits  30   PT/OT/Chiro combined   PT Start Time  0940    PT Stop Time  1022    PT Time Calculation (min)  42 min    Activity Tolerance  Patient tolerated treatment well;Patient limited by pain   limited by hernia pain   Behavior During Therapy  [facility] for tasks assessed/performed       Past Medical History:  Diagnosis Date  . ADD (attention deficit disorder)    Rx-Adderall  . Anginal pain ([place])   . AV block, 3rd degree (HCC)   . Colon polyps 2011   tubular adenoma by excisional biopsy during colonoscopy  . Complete heart block [place])    a. s/p PPM Placement in 12/2012 with Medtronic CRT-P placement in 03/2014  . Coronary artery disease    a. s/p PCI of the RCA in 2014  . Degenerative disc disease, cervical   . Gastrointestinal bleeding 10/15/2017  . GERD (gastroesophageal reflux disease)   . Hypertension   . Pacemaker   . Palpitations 2003   Holter in 2003: PACs and PVCs; negative stress nuclear test in 2005; right bundle branch block; echo in 2008-mild LVH, otherwise normal; 2008-negative stress nuclear test.  . Pneumonia   . Prostatitis    prostate calcifications by CT  . Sleep apnea    questionable diagnosis    Past Surgical History:  Procedure Laterality Date  . ANTERIOR CERVICAL DECOMP/DISCECTOMY FUSION    . BACK SURGERY    . Biventricular pacemaker  upgrade/ system revision  04/02/14   removal of previous atrial and ventricular leads with placement of a new MDT Consulta CRT-P system at [facility] by Dr Violet Baldy  . COLONOSCOPY    . COLONOSCOPY W/ POLYPECTOMY  2011   tubular adenoma  . ESOPHAGOGASTRODUODENOSCOPY     Gastritis  . INSERT / REPLACE / REMOVE PACEMAKER  12/17/2012   MDT Adapta L implanted by Dr Rayann Heman for mobitz II second degree AV block  . KNEE SURGERY Right   . LEFT HEART CATHETERIZATION WITH CORONARY ANGIOGRAM N/A 10/17/2012   Procedure: LEFT HEART CATHETERIZATION WITH CORONARY ANGIOGRAM;  Surgeon: Josue Hector, MD;  Location: [facility] CATH LAB;  Service: Cardiovascular;  Laterality: N/A;  . LEFT HEART CATHETERIZATION WITH CORONARY ANGIOGRAM N/A 12/17/2012   Procedure: LEFT HEART CATHETERIZATION WITH CORONARY ANGIOGRAM;  Surgeon: Peter M Martinique, MD;  Location: [facility] CATH LAB;  Service: Cardiovascular;  Laterality: N/A;  . LEFT HEART CATHETERIZATION WITH CORONARY ANGIOGRAM N/A 06/11/2013   Procedure: LEFT HEART CATHETERIZATION WITH CORONARY ANGIOGRAM;  Surgeon: Wellington Hampshire, MD;  Location: [place] CATH LAB;  Service: Cardiovascular;  Laterality: N/A;  . PERCUTANEOUS CORONARY STENT INTERVENTION (PCI-S)  10/17/2012   Procedure: PERCUTANEOUS CORONARY STENT INTERVENTION (PCI-S);  Surgeon: Josue Hector, MD;  Location: [facility] CATH LAB;  Service: Cardiovascular;;  . PERMANENT PACEMAKER INSERTION N/A 12/17/2012   Procedure: PERMANENT PACEMAKER INSERTION;  Surgeon: Thompson Grayer, MD;  Location: [facility] CATH LAB;  Service: Cardiovascular;  Laterality: N/A;  . POLYPECTOMY    . UPPER GASTROINTESTINAL ENDOSCOPY      There were no vitals filed for this visit.  Subjective Assessment - 02/28/18 0942    Subjective  Pt states that he is compliant with his HEP still. He is currently in about 3/10 pain in lower back and a little bit of numbness down LLE.     Limitations  Sitting;House hold activities    Patient Stated Goals  pain relief and stretching  out    Currently in Pain?  Yes    Pain Score  3     Pain Location  Back    Pain Orientation  Lower;Left    Pain Descriptors / Indicators  Aching;Burning    Pain Type  Chronic pain    Pain Onset  More than a month ago    Pain Frequency  Constant    Aggravating Factors   sitting    Pain Relieving Factors  walking    Effect of Pain on Daily Activities  increases            [facility] Adult PT Treatment/Exercise - 02/28/18 0001      Exercises   Exercises  Lumbar      Lumbar Exercises: Stretches   Other Lumbar Stretch Exercise  seated lumbar flexion stretch with big theraball fwd, R, L 10x5-10" holds each      Lumbar Exercises: Standing   Shoulder Extension Limitations  sidestepping 10ft x2RT BT    Shoulder Adduction Limitations  bil hip abd and diagonals 2x10 BTB      Lumbar Exercises: Seated   Other Seated Lumbar Exercises  3D thoracic excursions 10x5-10" holds each      Lumbar Exercises: Sidelying   Other Sidelying Lumbar Exercises  bil sidelying thoracic rotation 90/90 10x5-10" holds      Manual Therapy   Manual Therapy  Soft tissue mobilization;Joint mobilization    Manual therapy comments  separate rest of treatment    Joint Mobilization  central PA's to T3-L5, grade III-IV for mobility    Soft tissue mobilization  STM with red weighted ball to L lumbar paraspinals, proximal and distal lats and thoracolumbar fascia to reduce restrictions and pain           PT Short Term Goals - 02/14/18 0957      PT SHORT TERM GOAL #1   Title  Pt will have improved proximal hip MMT to 4+/5 in order to decrease LBP and improve functional mobility.    Time  3    Period  Weeks    Status  On-going      PT SHORT TERM GOAL #2   Title  Pt will have improved bil HS length to 150deg in order to demo improved flexibility and reduce LBP.    Time  3    Period  Weeks    Status  On-going      PT SHORT TERM GOAL #3   Title  Pt will be able to perform L SLS for 60sec without increases in L  hip pain to demo improved core strength, L hip strength, and L hip endurance in order to maximize his ambulation.    Time  3    Period  Weeks    Status  On-going      PT SHORT TERM  GOAL #4   Title  Pt will be able to sit for 10 mins comfortably to demo improved tolerance to this position and allow him to take a short drive comfortably.    Time  3    Period  Weeks    Status  On-going        PT Long Term Goals - 02/14/18 4008      PT LONG TERM GOAL #1   Title  Pt will have improved proximal hip MMT to 5/5 in order to further reduce LBP and LLE symptoms and maximize his tolerance to ambulation.    Time  6    Period  Weeks    Status  On-going      PT LONG TERM GOAL #2   Title  Pt will be able to sit for 30 mins comfortably to allow him to enjoy a meal with his family.    Time  6    Period  Weeks    Status  On-going      PT LONG TERM GOAL #3   Title  Pt will report being able to walk for 2 miles or longer with his wife and dogs without increases in pain in order to demo improved functional strength and tolerance to functional tasks to promote return to PLOF.     Time  6    Period  Weeks    Status  On-going      PT LONG TERM GOAL #4   Title  Pt will have reduced average daily pain to 2/10 or less to allow him to perform all functional and [place] activities with less pain and maximize return to PLOF.    Time  6    Period  Weeks    Status  On-going            Plan - 02/28/18 1026    Clinical Impression Statement  Continued with established POC focusing on thoracic and lumbar mobility as well as hip strengthening. Introduced pt to hip abd, diagonals, and sidestepping with BTB; pt denied any lower back pain with this and reported feeling it appropriately in the targeted mm groups. Continued with IASTM to lumbar spine mm and began to work into proximal lats this date. Ended with CPAs to lumbar and thoracic spine; he continues to be very stiff throughout all. Pt stating he felt more  loose at EOS and has some mm soreness but not pain. Continue as planned, progressing as able.     Rehab Potential  Good    PT Frequency  2x / week    PT Duration  6 weeks    PT Treatment/Interventions  ADLs/Self Care Home Management;Aquatic Therapy;Cryotherapy;Electrical Stimulation;Moist Heat;Traction;Ultrasound;Gait training;Stair training;Functional mobility training;Therapeutic activities;Therapeutic exercise;Balance training;Neuromuscular re-education;Patient/family education;Manual techniques;Passive range of motion;Dry needling;Energy conservation;Taping;Spinal Manipulations;Joint Manipulations    PT Next Visit Plan  Add child's pose with BUE IR for lats stretch and lumbar mobility; consider adding modified deadlifts and/or bend over rows for posterior chain and lower back strengthening; Continue with thoracic, lumbar mobility, manual and spinal mobility with PT PRN. Progress functional LE strengthening and core exercises    PT Home Exercise Plan  eval: supine HSS (hands behind knee), SKTC, supine figure 4 10/31:  piriformis stretch, bridge, clams, SLR; 11/5: POE; 11/14: hip abd, diagonals, sidestep BTB    Consulted and Agree with Plan of Care  Patient       Patient will benefit from skilled therapeutic intervention in order to improve the following deficits and impairments:  Abnormal gait, Decreased activity tolerance, Decreased endurance, Decreased mobility, Decreased range of motion, Decreased strength, Difficulty walking, Hypomobility, Increased fascial restricitons, Increased muscle spasms, Impaired flexibility, Pain  Visit Diagnosis: Chronic bilateral low back pain, unspecified whether sciatica present  Pain in left hip  Muscle weakness (generalized)  Other symptoms and signs involving the musculoskeletal system     Problem List Patient Active Problem List   Diagnosis Date Noted  . Ischemic colitis ([place])   . Hematochezia 10/15/2017  . Hyperlipidemia 01/04/2016  . Anxiety  as acute reaction to exceptional stress 05/28/2015  . OSA (obstructive sleep apnea) 10/16/2013  . Obstructive sleep apnea 08/25/2013  . BPH (benign prostatic hyperplasia) 08/25/2013  . Thoracic back pain 08/25/2013  . Chest pain, exertional 12/26/2012  . Intermediate coronary syndrome ([place]) 12/17/2012  . Bradycardia 12/17/2012  . Mobitz type II atrioventricular block 12/17/2012  . Coronary atherosclerosis of native coronary artery 10/18/2012  . S/P drug eluting coronary stent placement 10/18/2012  . Exertional dyspnea 12/26/2011  . Abdominal  pain 12/26/2011  . Hypertension   . ADD (attention deficit disorder)   . Palpitations   . GERD (gastroesophageal reflux disease) 12/20/2011  . Hx of adenomatous colonic polyps 12/20/2011       Geraldine Solar PT, DPT  [place] [address] [place], [place], [address] Phone: [phone]   Fax:  [phone]  Name: [name] MRN: [id number] Date of Birth: [birth year]

## 2018-03-05 ENCOUNTER — Encounter (HOSPITAL_COMMUNITY): Payer: Self-pay

## 2018-03-05 ENCOUNTER — Ambulatory Visit (HOSPITAL_COMMUNITY): Payer: BLUE CROSS/BLUE SHIELD

## 2018-03-05 DIAGNOSIS — M6281 Muscle weakness (generalized): Secondary | ICD-10-CM | POA: Diagnosis not present

## 2018-03-05 DIAGNOSIS — R29898 Other symptoms and signs involving the musculoskeletal system: Secondary | ICD-10-CM | POA: Diagnosis not present

## 2018-03-05 DIAGNOSIS — M25552 Pain in left hip: Secondary | ICD-10-CM | POA: Diagnosis not present

## 2018-03-05 DIAGNOSIS — G8929 Other chronic pain: Secondary | ICD-10-CM

## 2018-03-05 DIAGNOSIS — M545 Low back pain, unspecified: Secondary | ICD-10-CM

## 2018-03-05 NOTE — Therapy (Addendum)
[place], [place], [address] Phone: [phone]   Fax:  [phone]   Progress Note Reporting Period 02/12/18 to 03/05/18  See note below for Objective Data and Assessment of Progress/Goals.    Geraldine Solar PT, DPT   Physical Therapy Treatment  Patient Details  Name: [name] MRN: [id number] Date of Birth: Jun 16, 1955 Referring Provider (PT): Sallee Lange, MD   Encounter Date: 03/05/2018  PT End of Session - 03/05/18 0954    Visit Number  7    Number of Visits  13    Date for PT Re-Evaluation  03/26/18   Minireassess completed 03/05/18   Authorization Type  BCBS Other    Authorization Time Period  02/12/18 to 03/26/18    Authorization - Visit Number  7    Authorization - Number of Visits  30   PT/OT/Chiro combined   PT Start Time  2585    PT Stop Time  2778    PT Time Calculation (min)  40 min    Activity Tolerance  Patient tolerated treatment well;Patient limited by pain;No increased pain    Behavior During Therapy  WFL for tasks assessed/performed       Past Medical History:  Diagnosis Date  . ADD (attention deficit disorder)    Rx-Adderall  . Anginal pain ([place])   . AV block, 3rd degree (HCC)   . Colon polyps 2011   tubular adenoma by excisional biopsy during colonoscopy  . Complete heart block [facility])    a. s/p PPM Placement in 12/2012 with Medtronic CRT-P placement in 03/2014  . Coronary artery disease    a. s/p PCI of the RCA in 2014  . Degenerative disc disease, cervical   . Gastrointestinal bleeding 10/15/2017  . GERD (gastroesophageal reflux disease)   . Hypertension   . Pacemaker   . Palpitations 2003   Holter in 2003: PACs and PVCs; negative stress nuclear test in 2005; right bundle branch block; echo in 2008-mild LVH, otherwise normal; 2008-negative stress nuclear test.  . Pneumonia   . Prostatitis    prostate calcifications by CT  . Sleep apnea    questionable  diagnosis    Past Surgical History:  Procedure Laterality Date  . ANTERIOR CERVICAL DECOMP/DISCECTOMY FUSION    . BACK SURGERY    . Biventricular pacemaker upgrade/ system revision  04/02/14   removal of previous atrial and ventricular leads with placement of a new MDT Consulta CRT-P system at [facility] by Dr Violet Baldy  . COLONOSCOPY    . COLONOSCOPY W/ POLYPECTOMY  2011   tubular adenoma  . ESOPHAGOGASTRODUODENOSCOPY     Gastritis  . INSERT / REPLACE / REMOVE PACEMAKER  12/17/2012   MDT Adapta L implanted by Dr Rayann Heman for mobitz II second degree AV block  . KNEE SURGERY Right   . LEFT HEART CATHETERIZATION WITH CORONARY ANGIOGRAM N/A 10/17/2012   Procedure: LEFT HEART CATHETERIZATION WITH CORONARY ANGIOGRAM;  Surgeon: Josue Hector, MD;  Location: [facility] CATH LAB;  Service: Cardiovascular;  Laterality: N/A;  . LEFT HEART CATHETERIZATION WITH CORONARY ANGIOGRAM N/A 12/17/2012   Procedure: LEFT HEART CATHETERIZATION WITH CORONARY ANGIOGRAM;  Surgeon: Peter M Martinique, MD;  Location: [facility] CATH LAB;  Service: Cardiovascular;  Laterality: N/A;  . LEFT HEART CATHETERIZATION WITH CORONARY ANGIOGRAM N/A 06/11/2013   Procedure: LEFT HEART CATHETERIZATION WITH CORONARY ANGIOGRAM;  Surgeon: Wellington Hampshire, MD;  Location: [place] CATH LAB;  Service: Cardiovascular;  Laterality: N/A;  .  PERCUTANEOUS CORONARY STENT INTERVENTION (PCI-S)  10/17/2012   Procedure: PERCUTANEOUS CORONARY STENT INTERVENTION (PCI-S);  Surgeon: Josue Hector, MD;  Location: [facility] CATH LAB;  Service: Cardiovascular;;  . PERMANENT PACEMAKER INSERTION N/A 12/17/2012   Procedure: PERMANENT PACEMAKER INSERTION;  Surgeon: Thompson Grayer, MD;  Location: [facility] CATH LAB;  Service: Cardiovascular;  Laterality: N/A;  . POLYPECTOMY    . UPPER GASTROINTESTINAL ENDOSCOPY      There were no vitals filed for this visit.  Subjective Assessment - 03/05/18 0946    Subjective  Pt stated he feels like he slept wrong last night, increased pain 4-5/10  across lower back with sharp pains with certain movements.    Pertinent History  pacemaker    How long can you sit comfortably?  Able to sit for 5 min without medication, increased tolerance to 20 min with medication (was 1 min)    How long can you stand comfortably?  Standing doesnt bother him as much, does have Lt LE pain wiht standing (was always hurting)    How long can you walk comfortably?  Able to walk a mile easily with pain, does c/o Lt foot numbness (was >13mile (used to do 6 miles))    Patient Stated Goals  pain relief and stretching out    Currently in Pain?  Yes    Pain Score  5     Pain Location  Back    Pain Orientation  Lower    Pain Descriptors / Indicators  Sharp    Pain Type  Chronic pain    Pain Radiating Towards  Radicular symptoms (tickling on lateral aspect of knee and bottom of foot, no pain past knee)    Pain Onset  More than a month ago    Pain Frequency  Constant    Aggravating Factors   sitting    Pain Relieving Factors  walking    Effect of Pain on Daily Activities  increases         [facility] PT Assessment - 03/05/18 0001      Assessment   Medical Diagnosis  L sided sciatica, L leg pain    Referring Provider (PT)  Sallee Lange, MD    Onset Date/Surgical Date  --   about 1 year ago   Next MD Visit  after therapy     Prior Therapy  none      Observation/Other Assessments   Focus on Therapeutic Outcomes (FOTO)   FOTO limited 33%   FOTO was  limited 30%     AROM   AROM Assessment Site  Lumbar    Lumbar Flexion  25% limitation, pain, in lordosis   25% limitation, pain, in lordosis   Lumbar Extension  WFL    Lumbar - Right Side Bend  knee joint, stretch   was knee joint, stretch   Lumbar - Left Side Bend  knee joint, stretch    Lumbar - Right Rotation  WFL, nonpainful    Lumbar - Left Rotation  WFL, nonpainful      Strength   Strength Assessment Site  Hip;Knee;Ankle    Right Hip Extension  4+/5   was 4+   Right Hip ABduction  4+/5   was 4/5    Left Hip Flexion  4+/5   was 4+/85   Left Hip Extension  4+/5   was 4/5   Left Hip ABduction  4/5   was 4/5     Flexibility   Hamstrings  90/90: R: 146deg L:143 deg  was 90/90: R: 145deg L:139deg                  [facility] Adult PT Treatment/Exercise - 03/05/18 0001      Lumbar Exercises: Stretches   Other Lumbar Stretch Exercise  childs pose 3x 30" with UE in IR and chin to chest      Lumbar Exercises: Standing   Other Standing Lumbar Exercises  modified dead lift with 10# (cueing for mechanics)      Manual Therapy   Manual Therapy  Soft tissue mobilization    Manual therapy comments  separate rest of treatment    Soft tissue mobilization  STM to lumbar paraspinals/distal lats and Lt gluts to reduce restrictions and decrease pain               PT Short Term Goals - 03/05/18 1243      PT SHORT TERM GOAL #1   Title  Pt will have improved proximal hip MMT to 4+/5 in order to decrease LBP and improve functional mobility.    Baseline  03/05/18: see MMT    Status  On-going      PT SHORT TERM GOAL #2   Title  Pt will have improved bil HS length to 150deg in order to demo improved flexibility and reduce LBP.    Baseline  11/19: see flexibility    Status  On-going      PT SHORT TERM GOAL #3   Title  Pt will be able to perform L SLS for 60sec without increases in L hip pain to demo improved core strength, L hip strength, and L hip endurance in order to maximize his ambulation.    Baseline  03/05/18:  SLS Rt 60" first attempt; Lt average 32.66" (28", 23", 47")    Status  On-going      PT SHORT TERM GOAL #4   Title  Pt will be able to sit for 10 mins comfortably to demo improved tolerance to this position and allow him to take a short drive comfortably.    Baseline  03/05/18: Able to sit for 5 min without medication, increased tolerance to 20 min with medication (was 1 min)    Status  On-going        PT Long Term Goals - 02/14/18 0958      PT LONG TERM GOAL  #1   Title  Pt will have improved proximal hip MMT to 5/5 in order to further reduce LBP and LLE symptoms and maximize his tolerance to ambulation.    Time  6    Period  Weeks    Status  On-going      PT LONG TERM GOAL #2   Title  Pt will be able to sit for 30 mins comfortably to allow him to enjoy a meal with his family.    Time  6    Period  Weeks    Status  On-going      PT LONG TERM GOAL #3   Title  Pt will report being able to walk for 2 miles or longer with his wife and dogs without increases in pain in order to demo improved functional strength and tolerance to functional tasks to promote return to PLOF.     Time  6    Period  Weeks    Status  On-going      PT LONG TERM GOAL #4   Title  Pt will have reduced average daily pain to 2/10 or less  to allow him to perform all functional and Young activities with less pain and maximize return to PLOF.    Time  6    Period  Weeks    Status  On-going            Plan - 03/05/18 1239    Clinical Impression Statement  Began session by revieweing goals with subjective and objective testing complete including FOTO, MMT and ROM.  FOTO with 33% limitation (was 30% limitation initial).  Hip strengthening is improving slowly except for glut med with reports of increased pain following MMT.  Hamstring is improving mobility slowly.  Added child's pose for lats stretch as well as modified dead lift for posterior chain exercises to strengthen lower back musculature. multimodal cueing to reduce compensation with new exercises, no reports of pain with new activities.  EOS with manual to address restricitons for pain control in lumbar paraspinals and QL.  No reports of increased pain through session.      PT Frequency  2x / week    PT Duration  6 weeks    PT Treatment/Interventions  ADLs/Self Care Home Management;Aquatic Therapy;Cryotherapy;Electrical Stimulation;Moist Heat;Traction;Ultrasound;Gait training;Stair training;Functional mobility  training;Therapeutic activities;Therapeutic exercise;Balance training;Neuromuscular re-education;Patient/family education;Manual techniques;Passive range of motion;Dry needling;Energy conservation;Taping;Spinal Manipulations;Joint Manipulations    PT Next Visit Plan  Continue with child's pose with BUE IR for lats stretch and lumbar mobility; consider adding modified deadlifts and/or bend over rows for posterior chain and lower back strengthening; Continue with thoracic, lumbar mobility, manual and spinal mobility with PT PRN. Progress functional LE strengthening and core exercises    PT Home Exercise Plan  eval: supine HSS (hands behind knee), SKTC, supine figure 4 10/31:  piriformis stretch, bridge, clams, SLR; 11/5: POE; 11/14: hip abd, diagonals, sidestep BTB       Patient will benefit from skilled therapeutic intervention in order to improve the following deficits and impairments:  Abnormal gait, Decreased activity tolerance, Decreased endurance, Decreased mobility, Decreased range of motion, Decreased strength, Difficulty walking, Hypomobility, Increased fascial restricitons, Increased muscle spasms, Impaired flexibility, Pain  Visit Diagnosis: Chronic bilateral low back pain, unspecified whether sciatica present  Pain in left hip  Muscle weakness (generalized)  Other symptoms and signs involving the musculoskeletal system     Problem List Patient Active Problem List   Diagnosis Date Noted  . Ischemic colitis ([place])   . Hematochezia 10/15/2017  . Hyperlipidemia 01/04/2016  . Anxiety as acute reaction to exceptional stress 05/28/2015  . OSA (obstructive sleep apnea) 10/16/2013  . Obstructive sleep apnea 08/25/2013  . BPH (benign prostatic hyperplasia) 08/25/2013  . Thoracic back pain 08/25/2013  . Chest pain, exertional 12/26/2012  . Intermediate coronary syndrome ([place]) 12/17/2012  . Bradycardia 12/17/2012  . Mobitz type II atrioventricular block 12/17/2012  . Coronary  atherosclerosis of native coronary artery 10/18/2012  . S/P drug eluting coronary stent placement 10/18/2012  . Exertional dyspnea 12/26/2011  . Abdominal  pain 12/26/2011  . Hypertension   . ADD (attention deficit disorder)   . Palpitations   . GERD (gastroesophageal reflux disease) 12/20/2011  . Hx of adenomatous colonic polyps 12/20/2011   Ihor Austin, LPTA; CBIS [phone]  Aldona Lento 03/05/2018, 12:56 PM  [place], [place], [address] Phone: [phone]   Fax:  [phone]  Name: [name] MRN: [id number] Date of Birth: Jul 29, 1955

## 2018-03-07 ENCOUNTER — Encounter (HOSPITAL_COMMUNITY): Payer: Self-pay

## 2018-03-07 ENCOUNTER — Ambulatory Visit (HOSPITAL_COMMUNITY): Payer: BLUE CROSS/BLUE SHIELD

## 2018-03-07 DIAGNOSIS — G8929 Other chronic pain: Secondary | ICD-10-CM

## 2018-03-07 DIAGNOSIS — R29898 Other symptoms and signs involving the musculoskeletal system: Secondary | ICD-10-CM

## 2018-03-07 DIAGNOSIS — M6281 Muscle weakness (generalized): Secondary | ICD-10-CM

## 2018-03-07 DIAGNOSIS — M545 Low back pain, unspecified: Secondary | ICD-10-CM

## 2018-03-07 DIAGNOSIS — M25552 Pain in left hip: Secondary | ICD-10-CM | POA: Diagnosis not present

## 2018-03-07 NOTE — Therapy (Signed)
[place], [place], [address] Phone: [phone]   Fax:  [phone]  Physical Therapy Treatment  Patient Details  Name: [name] MRN: [id number] Date of Birth: [birth year] Referring Provider (PT): Sallee Lange, MD   Encounter Date: 03/07/2018  PT End of Session - 03/07/18 1001    Visit Number  8    Number of Visits  13    Date for PT Re-Evaluation  03/26/18   Minireassess complete 03/05/18   Authorization Type  [employer] Other    Authorization Time Period  02/12/18 to 03/26/18    Authorization - Visit Number  8    Authorization - Number of Visits  30   PT/OT/Chiro combined   PT Start Time  0950    PT Stop Time  1028    PT Time Calculation (min)  38 min    Activity Tolerance  Patient tolerated treatment well;Patient limited by pain;No increased pain    Behavior During Therapy  WFL for tasks assessed/performed       Past Medical History:  Diagnosis Date  . ADD (attention deficit disorder)    Rx-Adderall  . Anginal pain ([place])   . AV block, 3rd degree (H[facility])   . Colon polyps 2011   tubular adenoma by excisional biopsy during colonoscopy  . Complete heart block [facility])    a. s/p PPM Placement in 12/2012 with [employer]tr[employer] CRT-P placement in 03/2014  . Coronary artery disease    a. s/p PCI of the RCA in 2014  . Degenerative disc disease, cervical   . Gastrointestinal bleeding 10/15/2017  . GERD (gastroesophageal reflux disease)   . Hypertension   . Pacemaker   . Palpitations 2003   Holter in 2003: PACs and PVCs; negative stress nuclear test in 2005; right bundle branch block; echo in 2008-mild LVH, otherwise normal; 2008-negative stress nuclear test.  . Pneumonia   . Prostatitis    prostate calcifications by CT  . Sleep apnea    questionable diagnosis    Past Surgical History:  Procedure Laterality Date  . ANTERIOR CERVICAL DECOMP/DISCECTOMY FUSION    . BACK SURGERY    . Biventricular pacemaker  upgrade/ system revision  04/02/14   removal of previous atrial and ventricular leads with placement of a new MDT Consulta CRT-P system at [facility] by Dr Violet Baldy  . COLONOSCOPY    . COLONOSCOPY W/ POLYPECTOMY  2011   tubular adenoma  . ESOPHAGOGASTRODUODENOSCOPY     Gastritis  . INSERT / REPLACE / REMOVE PACEMAKER  12/17/2012   MDT Adapta L implanted by Dr Rayann Heman for mobitz II second degree AV block  . KNEE SURGERY Right   . LEFT HEART CATHETERIZATION WITH CORONARY ANGIOGRAM N/A 10/17/2012   Procedure: LEFT HEART CATHETERIZATION WITH CORONARY ANGIOGRAM;  Surgeon: Josue Hector, MD;  Location: [facility] CATH LAB;  Service: Cardiovascular;  Laterality: N/A;  . LEFT HEART CATHETERIZATION WITH CORONARY ANGIOGRAM N/A 12/17/2012   Procedure: LEFT HEART CATHETERIZATION WITH CORONARY ANGIOGRAM;  Surgeon: Peter M Martinique, MD;  Location: [facility] CATH LAB;  Service: Cardiovascular;  Laterality: N/A;  . LEFT HEART CATHETERIZATION WITH CORONARY ANGIOGRAM N/A 06/11/2013   Procedure: LEFT HEART CATHETERIZATION WITH CORONARY ANGIOGRAM;  Surgeon: Wellington Hampshire, MD;  Location: [place] CATH LAB;  Service: Cardiovascular;  Laterality: N/A;  . PERCUTANEOUS CORONARY STENT INTERVENTION (PCI-S)  10/17/2012   Procedure: PERCUTANEOUS CORONARY STENT INTERVENTION (PCI-S);  Surgeon: Josue Hector, MD;  Location: [facility] CATH LAB;  Service:  Cardiovascular;;  . PERMANENT PACEMAKER INSERTION N/A 12/17/2012   Procedure: PERMANENT PACEMAKER INSERTION;  Surgeon: Thompson Grayer, MD;  Location: [facility] CATH LAB;  Service: Cardiovascular;  Laterality: N/A;  . POLYPECTOMY    . UPPER GASTROINTESTINAL ENDOSCOPY      There were no vitals filed for this visit.  Subjective Assessment - 03/07/18 0956    Subjective  Pt stated he walked 2.5 miles yesterday with some soreness Lt hip, pain scale 5/10.  Reports today is 21st anniversary, plans to spend day with wife and take her out to dinner.    Pertinent History  pacemaker    Patient Stated Goals   pain relief and stretching out    Currently in Pain?  Yes    Pain Score  5     Pain Location  Hip    Pain Orientation  Left    Pain Descriptors / Indicators  Sore    Pain Type  Chronic pain    Pain Radiating Towards  No reports of radicular symptoms today (has c/o radicular symptoms (tickling on lateral aspect of knee and bottom of foot, no pain past knee)    Pain Onset  More than a month ago    Pain Frequency  Constant    Aggravating Factors   sitting    Pain Relieving Factors  walking    Effect of Pain on Daily Activities  increases                       [facility] Adult PT Treatment/Exercise - 03/07/18 0001      Exercises   Exercises  Lumbar      Lumbar Exercises: Stretches   Standing Extension  10 reps;5 seconds    Other Lumbar Stretch Exercise  childs pose 3x 30" with UE in IR and chin to chest    Other Lumbar Stretch Exercise  seated lumbar flexion stretch with big theraball fwd, R, L 10x5-10" holds each       Lumbar Exercises: Standing   Functional Squats  10 reps;Limitations    Shoulder Extension Limitations  sidestep 2RT BTB    Other Standing Lumbar Exercises  bil SLS vectors x5RT    Other Standing Lumbar Exercises  bend over rows 2x 10 with 10# BUE      Lumbar Exercises: Seated   Other Seated Lumbar Exercises  3D thoracic excursions 10x5-10" holds each               PT Short Term Goals - 03/05/18 1243      PT SHORT TERM GOAL #1   Title  Pt will have improved proximal hip MMT to 4+/5 in order to decrease LBP and improve functional mobility.    Baseline  03/05/18: see MMT    Status  On-going      PT SHORT TERM GOAL #2   Title  Pt will have improved bil HS length to 150deg in order to demo improved flexibility and reduce LBP.    Baseline  11/19: see flexibility    Status  On-going      PT SHORT TERM GOAL #3   Title  Pt will be able to perform L SLS for 60sec without increases in L hip pain to demo improved core strength, L hip strength, and L  hip endurance in order to maximize his ambulation.    Baseline  03/05/18:  SLS Rt 60" first attempt; Lt average 32.66" (28", 23", 47")    Status  On-going  PT SHORT TERM GOAL #4   Title  Pt will be able to sit for 10 mins comfortably to demo improved tolerance to this position and allow him to take a short drive comfortably.    Baseline  03/05/18: Able to sit for 5 min without medication, increased tolerance to 20 min with medication (was 1 min)    Status  On-going        PT Long Term Goals - 02/14/18 0958      PT LONG TERM GOAL #1   Title  Pt will have improved proximal hip MMT to 5/5 in order to further reduce LBP and LLE symptoms and maximize his tolerance to ambulation.    Time  6    Period  Weeks    Status  On-going      PT LONG TERM GOAL #2   Title  Pt will be able to sit for 30 mins comfortably to allow him to enjoy a meal with his family.    Time  6    Period  Weeks    Status  On-going      PT LONG TERM GOAL #3   Title  Pt will report being able to walk for 2 miles or longer with his wife and dogs without increases in pain in order to demo improved functional strength and tolerance to functional tasks to promote return to PLOF.     Time  6    Period  Weeks    Status  On-going      PT LONG TERM GOAL #4   Title  Pt will have reduced average daily pain to 2/10 or less to allow him to perform all functional and [place] activities with less pain and maximize return to PLOF.    Time  6    Period  Weeks    Status  On-going            Plan - 03/07/18 1448    Clinical Impression Statement  Session focus on lumbar mobility for pain control and hip strengthening exercises.  Pt able to complete all exercises with no reports of increased pain through session.  Add rows in forward bend for posterior chain strengthening for lumbar stability with some cueing required for mechanics with new exercise.      Rehab Potential  Good    PT Frequency  2x / week    PT Duration  6 weeks     PT Treatment/Interventions  ADLs/Self Care Home Management;Aquatic Therapy;Cryotherapy;Electrical Stimulation;Moist Heat;Traction;Ultrasound;Gait training;Stair training;Functional mobility training;Therapeutic activities;Therapeutic exercise;Balance training;Neuromuscular re-education;Patient/family education;Manual techniques;Passive range of motion;Dry needling;Energy conservation;Taping;Spinal Manipulations;Joint Manipulations    PT Next Visit Plan  Continue with child's pose with BUE IR for lats stretch and lumbar mobility; consider adding modified deadlifts and/or bend over rows for posterior chain and lower back strengthening; Continue with thoracic, lumbar mobility, manual and spinal mobility with PT PRN. Progress functional LE strengthening and core exercises    PT Home Exercise Plan  eval: supine HSS (hands behind knee), SKTC, supine figure 4 10/31:  piriformis stretch, bridge, clams, SLR; 11/5: POE; 11/14: hip abd, diagonals, sidestep BTB       Patient will benefit from skilled therapeutic intervention in order to improve the following deficits and impairments:  Abnormal gait, Decreased activity tolerance, Decreased endurance, Decreased mobility, Decreased range of motion, Decreased strength, Difficulty walking, Hypomobility, Increased fascial restricitons, Increased muscle spasms, Impaired flexibility, Pain  Visit Diagnosis: Chronic bilateral low back pain, unspecified whether sciatica present  Pain in  left hip  Muscle weakness (generalized)  Other symptoms and signs involving the musculoskeletal system     Problem List Patient Active Problem List   Diagnosis Date Noted  . Ischemic colitis ([place])   . Hematochezia 10/15/2017  . Hyperlipidemia 01/04/2016  . Anxiety as acute reaction to exceptional stress 05/28/2015  . OSA (obstructive sleep apnea) 10/16/2013  . Obstructive sleep apnea 08/25/2013  . BPH (benign prostatic hyperplasia) 08/25/2013  . Thoracic back pain  08/25/2013  . Chest pain, exertional 12/26/2012  . Intermediate coronary syndrome ([place]) 12/17/2012  . Bradycardia 12/17/2012  . Mobitz type II atrioventricular block 12/17/2012  . Coronary atherosclerosis of native coronary artery 10/18/2012  . S/P drug eluting coronary stent placement 10/18/2012  . Exertional dyspnea 12/26/2011  . Abdominal  pain 12/26/2011  . Hypertension   . ADD (attention deficit disorder)   . Palpitations   . GERD (gastroesophageal reflux disease) 12/20/2011  . Hx of adenomatous colonic polyps 12/20/2011   Ihor Austin, LPTA; CBIS [phone]  Aldona Lento 03/07/2018, 2:54 PM  [place], [place], [address] Phone: [phone]   Fax:  [phone]  Name: [name] MRN: [id number] Date of Birth: Aug 19, 1955

## 2018-03-11 NOTE — Progress Notes (Signed)
Cardiology Office Note    Date:  03/12/2018   ID:  SHAHZAD THOMANN, DOB [birth year], MRN [id number]  PCP:  Kathyrn Drown, MD  Cardiologist: Kate Sable, MD   EP: Dr. Rayann Heman  Chief Complaint  Patient presents with  . Follow-up    2 month visit    History of Present Illness:    [name] is a 62 y.o. male with past medical history of CAD (s/p PCI of the RCA in 2014), CHB (s/p PPM Placement in 12/2012 with Medtronic CRT-P placement in 03/2014), HTN, and HLD who presents to the office today for 99-month follow-up.  He was last examined by myself in 12/2017 and reported having episodes of dyspnea on exertion over the past several years but felt like his symptoms had acutely worsened over the past several months. He did report occasional episodes of chest discomfort when walking up inclines. Labs had been obtained by his PCP and showed no acute findings and an echocardiogram showed a preserved EF with no regional wall motion abnormalities. A Lexiscan Myoview was arranged for repeat ischemic evaluation and showed mild apical ischemia with a preserved EF and was overall read as a low risk study. This was similar to prior imaging in 2018. Was recommended to start Imdur 15 mg daily if he had needed to utilize any sublingual nitroglycerin since the time of his last office visit.  In talking with the patient today, he reports overall doing well from a cardiac perspective since his last office visit. He reports having dyspnea when "overdoing any activities" but denies any symptoms with routine activities. Has not had to utilize SL NTG. He walked a mile earlier today without any chest discomfort or dyspnea. Reports that he has been working with physical therapy multiple times per week due to sciatic pain. He denies any recent orthopnea, PND, lower extremity edema, dizziness, or presyncope. He does report having occasional palpitations at night when awakening from a flashback due to his  PTSD.   Past Medical History:  Diagnosis Date  . ADD (attention deficit disorder)    Rx-Adderall  . Anginal pain ([place])   . AV block, 3rd degree (HCC)   . Colon polyps 2011   tubular adenoma by excisional biopsy during colonoscopy  . Complete heart block [facility])    a. s/p PPM Placement in 12/2012 with Medtronic CRT-P placement in 03/2014  . Coronary artery disease    a. s/p PCI of the RCA in 2014  . Degenerative disc disease, cervical   . Gastrointestinal bleeding 10/15/2017  . GERD (gastroesophageal reflux disease)   . Hypertension   . Pacemaker   . Palpitations 2003   Holter in 2003: PACs and PVCs; negative stress nuclear test in 2005; right bundle branch block; echo in 2008-mild LVH, otherwise normal; 2008-negative stress nuclear test.  . Pneumonia   . Prostatitis    prostate calcifications by CT  . Sleep apnea    questionable diagnosis    Past Surgical History:  Procedure Laterality Date  . ANTERIOR CERVICAL DECOMP/DISCECTOMY FUSION    . BACK SURGERY    . Biventricular pacemaker upgrade/ system revision  04/02/14   removal of previous atrial and ventricular leads with placement of a new MDT Consulta CRT-P system at [facility] by Dr Violet Baldy  . COLONOSCOPY    . COLONOSCOPY W/ POLYPECTOMY  2011   tubular adenoma  . ESOPHAGOGASTRODUODENOSCOPY     Gastritis  . INSERT / REPLACE / REMOVE PACEMAKER  12/17/2012  MDT Adapta L implanted by Dr Rayann Heman for mobitz II second degree AV block  . KNEE SURGERY Right   . LEFT HEART CATHETERIZATION WITH CORONARY ANGIOGRAM N/A 10/17/2012   Procedure: LEFT HEART CATHETERIZATION WITH CORONARY ANGIOGRAM;  Surgeon: Josue Hector, MD;  Location: [facility] CATH LAB;  Service: Cardiovascular;  Laterality: N/A;  . LEFT HEART CATHETERIZATION WITH CORONARY ANGIOGRAM N/A 12/17/2012   Procedure: LEFT HEART CATHETERIZATION WITH CORONARY ANGIOGRAM;  Surgeon: Peter M Martinique, MD;  Location: [facility] CATH LAB;  Service: Cardiovascular;  Laterality: N/A;  .  LEFT HEART CATHETERIZATION WITH CORONARY ANGIOGRAM N/A 06/11/2013   Procedure: LEFT HEART CATHETERIZATION WITH CORONARY ANGIOGRAM;  Surgeon: Wellington Hampshire, MD;  Location: [place] CATH LAB;  Service: Cardiovascular;  Laterality: N/A;  . PERCUTANEOUS CORONARY STENT INTERVENTION (PCI-S)  10/17/2012   Procedure: PERCUTANEOUS CORONARY STENT INTERVENTION (PCI-S);  Surgeon: Josue Hector, MD;  Location[facility] C[facility] LAB;  Service: Cardiovascular;;  . PERMANENT PACEMAKER INSERTION N/A 12/17/2012   Procedure: PERMANENT PACEMAKER INSERTION;  Surgeon: Thompson Grayer, MD;  Location: [facility] CATH LAB;  Service: Cardiovascular;  Laterality: N/A;  . POLYPECTOMY    . UPPER GASTROINTESTINAL ENDOSCOPY      Current Medications: Outpatient Medications Prior to Visit  Medication Sig Dispense Refill  . ALPRAZolam (XANAX) 0.5 MG tablet Take 1 tablet (0.5 mg total) by mouth 3 (three) times daily as needed for sleep or anxiety. 30 tablet 0  . aspirin 81 MG chewable tablet Chew 81 mg by mouth daily.    . cyclobenzaprine (FLEXERIL) 5 MG tablet Take 5 mg by mouth at bedtime as needed for muscle spasms.    Marland Kitchen dicyclomine (BENTYL) 20 MG tablet Take 20 mg by mouth 3 (three) times daily as needed for spasms.    Marland Kitchen esomeprazole (NEXIUM) 40 MG capsule TAKE 1 CAPSULE ONCE DAILY 30 capsule 5  . hydrALAZINE (APRESOLINE) 50 MG tablet Take 1 tablet (50 mg total) by mouth 2 (two) times daily. 180 tablet 1  . losartan (COZAAR) 100 MG tablet Take 1 tablet (100 mg total) by mouth daily. 90 tablet 3  . nadolol (CORGARD) 80 MG tablet Take 1 tablet (80 mg total) by mouth daily. 90 tablet 3  . nitroGLYCERIN (NITROSTAT) 0.4 MG SL tablet Place 1 tablet (0.4 mg total) under the tongue every 5 (five) minutes as needed for chest pain (MAX 3 TABLETS). 25 tablet 3  . ondansetron (ZOFRAN) 8 MG tablet Take 1 tablet (8 mg total) by mouth every 8 (eight) hours as needed for nausea. 30 tablet 5  . oxyCODONE-acetaminophen (PERCOCET) 10-325 MG tablet Take 1 tablet by mouth  every 4 (four) hours as needed for pain. 30 tablet 0  . pregabalin (LYRICA) 50 MG capsule Take 1 capsule (50 mg total) by mouth 2 (two) times daily. 60 capsule 5  . rosuvastatin (CRESTOR) 20 MG tablet TAKE 1 TABLET BY MOUTH DAILY 30 tablet 5  . tamsulosin (FLOMAX) 0.4 MG CAPS capsule TAKE 2 CAPSULES BY MOUTH ONCE DAILY 60 capsule 5  . amLODipine (NORVASC) 5 MG tablet Take 1 tablet (5 mg total) by mouth daily. 30 tablet 5   No facility-administered medications prior to visit.      Allergies:   Morphine and related; Lasix [furosemide]; and Latex   Social History   Socioeconomic History  . Marital status: Married    Spouse name: Not on file  . Number of children: 3  . Years of education: Not on file  . Highest education level: Not on file  Occupational History  . Not on file  Social Needs  . Financial resource strain: Not on file  . Food insecurity:    Worry: Not on file    Inability: Not on file  . Transportation needs:    Medical: Not on file    Non-medical: Not on file  Tobacco Use  . Smoking status: Former Smoker    Packs/day: 2.50    Years: 40.00    Pack years: 100.00    Types: Cigarettes    Start date: 04/18/1967    Last attempt to quit: 12/20/2003    Years since quitting: 14.2  . Smokeless tobacco: Former Network engineer and Sexual Activity  . Alcohol use: Yes    Alcohol/week: 1.0 standard drinks    Types: 1 Cans of beer per week  . Drug use: Not Currently    Types: Marijuana    Comment: 30 + years ago - "crank, marjiuanna, ect."  . Sexual activity: Yes    Birth control/protection: Surgical  Lifestyle  . Physical activity:    Days per week: Not on file    Minutes per session: Not on file  . Stress: Not on file  Relationships  . Social connections:    Talks on phone: Not on file    Gets together: Not on file    Attends religious service: Not on file    Active member of club or organization: Not on file    Attends meetings of clubs or organizations: Not on  file    Relationship status: Not on file  Other Topics Concern  . Not on file  Social History Narrative  . Not on file     Family History:  The patient's family history includes Breast cancer in his maternal aunt; Heart disease in his mother; Hypertension in his mother; Prostate cancer in his brother.   Review of Systems:   Please see the history of present illness.     General:  No chills, fever, night sweats or weight changes.  Cardiovascular:  No chest pain, edema, orthopnea, paroxysmal nocturnal dyspnea. Positive for palpitations and dyspnea.  Dermatological: No rash, lesions/masses Respiratory: No cough, dyspnea Urologic: No hematuria, dysuria Abdominal:   No nausea, vomiting, diarrhea, bright red blood per rectum, melena, or hematemesis Neurologic:  No visual changes, wkns, changes in mental status. All other systems reviewed and are otherwise negative except as noted above.   Physical Exam:    VS:  BP 114/72   Pulse 72   Ht 5\' 11"  (1.803 m)   Wt 224 lb 9.6 oz (101.9 kg)   SpO2 95%   BMI 31.33 kg/m    General: Well developed, well nourished Caucasian male appearing in no acute distress. Head: Normocephalic, atraumatic, sclera non-icteric, no xanthomas, nares are without discharge.  Neck: No carotid bruits. JVD not elevated.  Lungs: Respirations regular and unlabored, without wheezes or rales.  Heart: Regular rate and rhythm. No S3 or S4.  No murmur, no rubs, or gallops appreciated. Abdomen: Soft, non-tender, non-distended with normoactive bowel sounds. No hepatomegaly. No rebound/guarding. No obvious abdominal masses. Msk:  Strength and tone appear normal for age. No joint deformities or effusions. Extremities: No clubbing or cyanosis. No lower extremity edema.  Distal pedal pulses are 2+ bilaterally. Neuro: Alert and oriented X 3. Moves all extremities spontaneously. No focal deficits noted. Psych:  Responds to questions appropriately with a normal affect. Skin: No  rashes or lesions noted  Wt Readings from Last 3 Encounters:  03/12/18 224  lb 9.6 oz (101.9 kg)  02/06/18 225 lb 12.8 oz (102.4 kg)  01/09/18 228 lb (103.4 kg)     Studies/Labs Reviewed:   EKG:  EKG is not ordered today.   Recent Labs: 10/15/2017: ALT 23 12/05/2017: BNP 103.7; BUN 14; Creatinine, Ser 1.08; Hemoglobin 14.8; Platelets 199; Potassium 4.5; Sodium 142; TSH 2.920   Lipid Panel    Component Value Date/Time   CHOL 127 08/06/2017 0959   TRIG 68 08/06/2017 0959   HDL 58 08/06/2017 0959   CHOLHDL 2.2 08/06/2017 0959   CHOLHDL 2.5 12/17/2012 0215   VLDL 9 12/17/2012 0215   LDLCALC 55 08/06/2017 0959    Additional studies/ records that were reviewed today include:   Echocardiogram: 12/2017 Study Conclusions  - Left ventricle: The cavity size was normal. Wall thickness was   increased in a pattern of mild LVH. Systolic function was normal.   The estimated ejection fraction was in the range of 55% to 60%.   Wall motion was normal; there were no regional wall motion   abnormalities. - Aortic valve: Mildly to moderately calcified annulus. Trileaflet.   There was trivial regurgitation. Mean gradient (S): 7 mm Hg. Peak   gradient (S): 14 mm Hg. VTI ratio of LVOT to aortic valve: 0.57.   Valve area (VTI): 2.15 cm^2. - Mitral valve: There was trivial regurgitation. - Left atrium: The atrium was mildly dilated. - Right ventricle: Pacer wire or catheter noted in right ventricle. - Right atrium: Pacer wire or catheter noted in right atrium. - Tricuspid valve: There was trivial regurgitation. - Pulmonary arteries: Systolic pressure could not be accurately   estimated. - Pericardium, extracardiac: There was no pericardial effusion.  NST: 12/2017  Findings consistent with mild apical ischemia.  The left ventricular ejection fraction is low normal (53%).  This is a low risk study.  Patient ventricular paced throughout study.    Assessment:    1. Coronary artery  disease of bypass graft of native heart with stable angina pectoris ([place])   2. Mobitz type II atrioventricular block   3. Essential hypertension   4. Hyperlipidemia LDL goal <70      Plan:   In order of problems listed above:  1.  CAD - s/p PCI of the RCA in 2014, with recent low-risk NST in 06/2016 and 12/2017 as outlined above. He has baseline dyspnea when over-exerting himself but denies any symptoms with routine activities. Reports his breathing has improved since his last visit and he denies any chest pain. Does have occasional palpitations which mostly occurs following a flashback in the setting of known PTSD. - continue ASA, statin, and BB therapy.   2. CHB - s/p PPM Placement in 12/2012 with Medtronic CRT-P placement in 03/2014. His last device interrogation in 10/2017 showed 3 episodes NSVT and stable thoracic impedence. He is due for a repeat remote device check as this was to take place in 12/2017. Will message the device clinic today to assist with scheduling this.   3. HTN - BP is well controlled at 114/72 during today's visit. He has been making dietary changes and losing weight in an effort to come off of some of his BP medications. Given his well-controlled readings today and when checked at home, will reduce Amlodipine to 2.5 mg daily and I have encouraged him to follow this in the ambulatory setting. If BP remains less than 130/90 over the next few weeks, can stop Amlodipine and follow readings. Will remain on current dosing of  Hydralazine, Losartan, and Nadolol at this time.   4. HLD - Followed by PCP.  He reports being told that his labs "looked great" based off of his most recent report.  Will request these records. Goal LDL is less than 70 in the setting of known CAD and he remains on Crestor 20 mg daily.   Medication Adjustments/Labs and Tests Ordered: Current medicines are reviewed at length with the patient today.  Concerns regarding medicines are outlined above.   Medication changes, Labs and Tests ordered today are listed in the Patient Instructions below. Patient Instructions  Medication Instructions:  Your physician has recommended you make the following change in your medication:  Decrease Norvasc to 2.5 mg   If you need a refill on your cardiac medications before your next appointment, please call your pharmacy.   Lab work: NONE   If you have labs (blood work) drawn today and your tests are completely normal, you will receive your results only by: Marland Kitchen MyChart Message (if you have MyChart) OR . A paper copy in the mail If you have any lab test that is abnormal or we need to change your treatment, we will call you to review the results.  Testing/Procedures: NONE   Follow-Up: At [facility], you and your health needs are our priority.  As part of our continuing mission to provide you with exceptional heart care, we have created designated Provider Care Teams.  These Care Teams include your primary Cardiologist (physician) and Advanced Practice Providers (APPs -  Physician Assistants and Nurse Practitioners) who all work together to provide you with the care you need, when you need it. You will need a follow up appointment in 6 months.  Please call our office 2 months in advance to schedule this appointment.  You may see Kate Sable, MD or one of the following Advanced Practice Providers on your designated Care Team:   Bernerd Pho, PA-C [facility]) . Ermalinda Barrios, PA-C ([place])  Any Other Special Instructions Will Be Listed Below (If Applicable). Thank you for choosing [place]!    Signed, [name], PA-C  03/12/2018 3:43 PM    [place] [address] [place], [place] [address] Phone: [phone]

## 2018-03-12 ENCOUNTER — Encounter: Payer: Self-pay | Admitting: *Deleted

## 2018-03-12 ENCOUNTER — Ambulatory Visit: Payer: BLUE CROSS/BLUE SHIELD | Admitting: Student

## 2018-03-12 ENCOUNTER — Ambulatory Visit (HOSPITAL_COMMUNITY): Payer: BLUE CROSS/BLUE SHIELD

## 2018-03-12 ENCOUNTER — Encounter (HOSPITAL_COMMUNITY): Payer: Self-pay

## 2018-03-12 ENCOUNTER — Encounter: Payer: Self-pay | Admitting: Student

## 2018-03-12 ENCOUNTER — Encounter: Payer: Self-pay | Admitting: Cardiology

## 2018-03-12 ENCOUNTER — Telehealth: Payer: Self-pay | Admitting: *Deleted

## 2018-03-12 VITALS — BP 114/72 | HR 72 | Ht 71.0 in | Wt 224.6 lb

## 2018-03-12 DIAGNOSIS — E785 Hyperlipidemia, unspecified: Secondary | ICD-10-CM

## 2018-03-12 DIAGNOSIS — G8929 Other chronic pain: Secondary | ICD-10-CM | POA: Diagnosis not present

## 2018-03-12 DIAGNOSIS — I10 Essential (primary) hypertension: Secondary | ICD-10-CM | POA: Diagnosis not present

## 2018-03-12 DIAGNOSIS — M545 Low back pain, unspecified: Secondary | ICD-10-CM

## 2018-03-12 DIAGNOSIS — M25552 Pain in left hip: Secondary | ICD-10-CM | POA: Diagnosis not present

## 2018-03-12 DIAGNOSIS — I25708 Atherosclerosis of coronary artery bypass graft(s), unspecified, with other forms of angina pectoris: Secondary | ICD-10-CM

## 2018-03-12 DIAGNOSIS — I441 Atrioventricular block, second degree: Secondary | ICD-10-CM

## 2018-03-12 DIAGNOSIS — R29898 Other symptoms and signs involving the musculoskeletal system: Secondary | ICD-10-CM | POA: Diagnosis not present

## 2018-03-12 DIAGNOSIS — M6281 Muscle weakness (generalized): Secondary | ICD-10-CM | POA: Diagnosis not present

## 2018-03-12 MED ORDER — AMLODIPINE BESYLATE 2.5 MG PO TABS: 2.5000 mg | ORAL_TABLET | Freq: Every day | ORAL | 3 refills | Status: DC

## 2018-03-12 NOTE — Telephone Encounter (Signed)
Unable to reach patient at cell number--mailbox full.  LMOM (DPR) advising that patient can send a transmission from his home monitor (Carelink [phone]) and we will add him back on the remotes schedule. Gave direct DC number and requested that he call us directly if he needs assistance with monitor, or if he has questions/concerns.

## 2018-03-12 NOTE — Telephone Encounter (Signed)
-----   Message from [name], [place] sent at 03/12/2018  3:46 PM EST ----- [name],   This patient missed his last remote transmission in 12/2017 and I was hoping you could assist with re-scheduling this.   Thanks for your help!  [place],  [place]

## 2018-03-12 NOTE — Patient Instructions (Signed)
Medication Instructions:  Your physician has recommended you make the following change in your medication:  Decrease Norvasc to 2.5 mg   If you need a refill on your cardiac medications before your next appointment, please call your pharmacy.   Lab work: NONE   If you have labs (blood work) drawn today and your tests are completely normal, you will receive your results only by: Marland Kitchen MyChart Message (if you have MyChart) OR . A paper copy in the mail If you have any lab test that is abnormal or we need to change your treatment, we will call you to review the results.  Testing/Procedures: NONE   Follow-Up: At [facility], you and your health needs are our priority.  As part of our continuing mission to provide you with exceptional heart care, we have created designated Provider Care Teams.  These Care Teams include your primary Cardiologist (physician) and Advanced Practice Providers (APPs -  Physician Assistants and Nurse Practitioners) who all work together to provide you with the care you need, when you need it. You will need a follow up appointment in 6 months.  Please call our office 2 months in advance to schedule this appointment.  You may see Kate Sable, MD or one of the following Advanced Practice Providers on your designated Care Team:   Bernerd Pho, PA-C [facility]) . Ermalinda Barrios, PA-C ([place])  Any Other Special Instructions Will Be Listed Below (If Applicable). Thank you for choosing [place]!

## 2018-03-12 NOTE — Therapy (Signed)
[place], [place], [address] Phone: [phone]   Fax:  [phone]  Physical Therapy Treatment  Patient Details  Name: [name] MRN: [id number] Date of Birth: 01-26-1956 Referring Provider (PT): Sallee Lange, MD   Encounter Date: 03/12/2018  PT End of Session - 03/12/18 0900    Visit Number  9    Number of Visits  13    Date for PT Re-Evaluation  03/26/18   Minireassess complete 03/05/18   Authorization Type  BCBS Other    Authorization Time Period  02/12/18 to 03/26/18    Authorization - Visit Number  9    Authorization - Number of Visits  30   PT/OT/[place] combined   PT Start Time  0900    PT Stop Time  0927    PT Time Calculation (min)  27 min    Activity Tolerance  Patient tolerated treatment well;Patient limited by pain;No increased pain    Behavior During Therapy  WFL for tasks assessed/performed       Past Medical History:  Diagnosis Date  . ADD (attention deficit disorder)    Rx-Adderall  . Anginal pain ([place])   . AV block, 3rd degree (HCC)   . Colon polyps 2011   tubular adenoma by excisional biopsy during colonoscopy  . Complete heart block [facility])    a. s/p PPM Placement in 12/2012 with Medtronic CRT-P placement in 03/2014  . Coronary artery disease    a. s/p PCI of the RCA in 2014  . Degenerative disc disease, cervical   . Gastrointestinal bleeding 10/15/2017  . GERD (gastroesophageal reflux disease)   . Hypertension   . Pacemaker   . Palpitations 2003   Holter in 2003: PACs and PVCs; negative stress nuclear test in 2005; right bundle branch block; echo in 2008-mild LVH, otherwise normal; 2008-negative stress nuclear test.  . Pneumonia   . Prostatitis    prostate calcifications by CT  . Sleep apnea    questionable diagnosis    Past Surgical History:  Procedure Laterality Date  . ANTERIOR CERVICAL DECOMP/DISCECTOMY FUSION    . BACK SURGERY    . Biventricular pacemaker  upgrade/ system revision  04/02/14   removal of previous atrial and ventricular leads with placement of a new MDT Consulta CRT-P system at [facility] by Dr Violet Baldy  . COLONOSCOPY    . COLONOSCOPY W/ POLYPECTOMY  2011   tubular adenoma  . ESOPHAGOGASTRODUODENOSCOPY     Gastritis  . INSERT / REPLACE / REMOVE PACEMAKER  12/17/2012   MDT Adapta L implanted by Dr Rayann Heman for mobitz II second degree AV block  . KNEE SURGERY Right   . LEFT HEART CATHETERIZATION WITH CORONARY ANGIOGRAM N/A 10/17/2012   Procedure: LEFT HEART CATHETERIZATION WITH CORONARY ANGIOGRAM;  Surgeon: Josue Hector, MD;  Location: [facility] CATH LAB;  Service: Cardiovascular;  Laterality: N/A;  . LEFT HEART CATHETERIZATION WITH CORONARY ANGIOGRAM N/A 12/17/2012   Procedure: LEFT HEART CATHETERIZATION WITH CORONARY ANGIOGRAM;  Surgeon: Peter M Martinique, MD;  Location: [facility] CATH LAB;  Service: Cardiovascular;  Laterality: N/A;  . LEFT HEART CATHETERIZATION WITH CORONARY ANGIOGRAM N/A 06/11/2013   Procedure: LEFT HEART CATHETERIZATION WITH CORONARY ANGIOGRAM;  Surgeon: Wellington Hampshire, MD;  Location: [place] CATH LAB;  Service: Cardiovascular;  Laterality: N/A;  . PERCUTANEOUS CORONARY STENT INTERVENTION (PCI-S)  10/17/2012   Procedure: PERCUTANEOUS CORONARY STENT INTERVENTION (PCI-S);  Surgeon: Josue Hector, MD;  Location: [facility] CATH LAB;  Service:  Cardiovascular;;  . PERMANENT PACEMAKER INSERTION N/A 12/17/2012   Procedure: PERMANENT PACEMAKER INSERTION;  Surgeon: Thompson Grayer, MD;  Location: [facility] CATH LAB;  Service: Cardiovascular;  Laterality: N/A;  . POLYPECTOMY    . UPPER GASTROINTESTINAL ENDOSCOPY      There were no vitals filed for this visit.  Subjective Assessment - 03/12/18 0901    Subjective  Pt states that he had a long night last night and he had to take a full pain pill this morning. The pill is kicking in so his pain is about 4/10 currently. His hip kept waking him up last night.     Pertinent History  pacemaker     Patient Stated Goals  pain relief and stretching out    Currently in Pain?  Yes    Pain Score  4     Pain Location  Hip    Pain Orientation  Left    Pain Descriptors / Indicators  Aching;Dull    Pain Type  Chronic pain    Pain Onset  More than a month ago    Pain Frequency  Constant    Aggravating Factors   sitting    Pain Relieving Factors  walking    Effect of Pain on Daily Activities  increases            [facility] Adult PT Treatment/Exercise - 03/12/18 0001      Exercises   Exercises  Lumbar      Lumbar Exercises: Stretches   Other Lumbar Stretch Exercise  childs pose 3x 30" with UE in IR and chin to chest      Lumbar Exercises: Sidelying   Clam  Left;15 reps    Clam Limitations  belted iso clams x10-15" holds      Manual Therapy   Manual Therapy  Soft tissue mobilization    Manual therapy comments  separate rest of treatment    Soft tissue mobilization  IASTM green ball to L glutes along mm belly and insertion at greater trochanter and into ITB             PT Education - 03/12/18 0904    Education Details  exercise techinque, continue HEP    Person(s) Educated  Patient    Methods  Explanation;Demonstration    Comprehension  Verbalized understanding;Returned demonstration       PT Short Term Goals - 03/05/18 1243      PT SHORT TERM GOAL #1   Title  Pt will have improved proximal hip MMT to 4+/5 in order to decrease LBP and improve functional mobility.    Baseline  03/05/18: see MMT    Status  On-going      PT SHORT TERM GOAL #2   Title  Pt will have improved bil HS length to 150deg in order to demo improved flexibility and reduce LBP.    Baseline  11/19: see flexibility    Status  On-going      PT SHORT TERM GOAL #3   Title  Pt will be able to perform L SLS for 60sec without increases in L hip pain to demo improved core strength, L hip strength, and L hip endurance in order to maximize his ambulation.    Baseline  03/05/18:  SLS Rt 60" first attempt;  Lt average 32.66" (28", 23", 47")    Status  On-going      PT SHORT TERM GOAL #4   Title  Pt will be able to sit for 10 mins comfortably to  demo improved tolerance to this position and allow him to take a short drive comfortably.    Baseline  03/05/18: Able to sit for 5 min without medication, increased tolerance to 20 min with medication (was 1 min)    Status  On-going        PT Long Term Goals - 02/14/18 0958      PT LONG TERM GOAL #1   Title  Pt will have improved proximal hip MMT to 5/5 in order to further reduce LBP and LLE symptoms and maximize his tolerance to ambulation.    Time  6    Period  Weeks    Status  On-going      PT LONG TERM GOAL #2   Title  Pt will be able to sit for 30 mins comfortably to allow him to enjoy a meal with his family.    Time  6    Period  Weeks    Status  On-going      PT LONG TERM GOAL #3   Title  Pt will report being able to walk for 2 miles or longer with his wife and dogs without increases in pain in order to demo improved functional strength and tolerance to functional tasks to promote return to PLOF.     Time  6    Period  Weeks    Status  On-going      PT LONG TERM GOAL #4   Title  Pt will have reduced average daily pain to 2/10 or less to allow him to perform all functional and [place] activities with less pain and maximize return to PLOF.    Time  6    Period  Weeks    Status  On-going            Plan - 03/12/18 0929    Clinical Impression Statement  Pt with increased L hip pain this date which limited some therex. Had pt perform iso clams for pain relief and then rest of session focused mainly on reducing pt's hip pain with manual IASTM. Pt point-tender near greater trochanter and had restrictions into glutes. Pt wishing to end session early and reported having reduced pain at EOS. Continue as planned progressing as able.    Rehab Potential  Good    PT Frequency  2x / week    PT Duration  6 weeks    PT Treatment/Interventions   ADLs/Self Care Home Management;Aquatic Therapy;Cryotherapy;Electrical Stimulation;Moist Heat;Traction;Ultrasound;Gait training;Stair training;Functional mobility training;Therapeutic activities;Therapeutic exercise;Balance training;Neuromuscular re-education;Patient/family education;Manual techniques;Passive range of motion;Dry needling;Energy conservation;Taping;Spinal Manipulations;Joint Manipulations    PT Next Visit Plan  soft tissue work should only be performed with ball or the stick (IASTM only) due to patient preference; Continue with child's pose with BUE IR for lats stretch and lumbar mobility; consider adding modified deadlifts and/or bend over rows for posterior chain and lower back strengthening; Continue with thoracic, lumbar mobility, manual and spinal mobility with PT PRN. Progress functional LE strengthening and core exercises    PT Home Exercise Plan  eval: supine HSS (hands behind knee), SKTC, supine figure 4 10/31:  piriformis stretch, bridge, clams, SLR; 11/5: POE; 11/14: hip abd, diagonals, sidestep BTB    Consulted and Agree with Plan of Care  Patient       Patient will benefit from skilled therapeutic intervention in order to improve the following deficits and impairments:  Abnormal gait, Decreased activity tolerance, Decreased endurance, Decreased mobility, Decreased range of motion, Decreased strength, Difficulty walking, Hypomobility, Increased  fascial restricitons, Increased muscle spasms, Impaired flexibility, Pain  Visit Diagnosis: Chronic bilateral low back pain, unspecified whether sciatica present  Pain in left hip  Muscle weakness (generalized)  Other symptoms and signs involving the musculoskeletal system     Problem List Patient Active Problem List   Diagnosis Date Noted  . Ischemic colitis ([place])   . Hematochezia 10/15/2017  . Hyperlipidemia 01/04/2016  . Anxiety as acute reaction to exceptional stress 05/28/2015  . OSA (obstructive sleep apnea)  10/16/2013  . Obstructive sleep apnea 08/25/2013  . BPH (benign prostatic hyperplasia) 08/25/2013  . Thoracic back pain 08/25/2013  . Chest pain, exertional 12/26/2012  . Intermediate coronary syndrome ([place]) 12/17/2012  . Bradycardia 12/17/2012  . Mobitz type II atrioventricular block 12/17/2012  . Coronary atherosclerosis of native coronary artery 10/18/2012  . S/P drug eluting coronary stent placement 10/18/2012  . Exertional dyspnea 12/26/2011  . Abdominal  pain 12/26/2011  . Hypertension   . ADD (attention deficit disorder)   . Palpitations   . GERD (gastroesophageal reflux disease) 12/20/2011  . Hx of adenomatous colonic polyps 12/20/2011        Geraldine Solar PT, DPT   [facility] [address] [place], [place], [address] Phone: [phone]   Fax:  [phone]  Name: [name] MRN: [id number] Date of Birth: [birth year]

## 2018-03-13 ENCOUNTER — Ambulatory Visit (INDEPENDENT_AMBULATORY_CARE_PROVIDER_SITE_OTHER): Payer: BLUE CROSS/BLUE SHIELD

## 2018-03-13 DIAGNOSIS — I441 Atrioventricular block, second degree: Secondary | ICD-10-CM | POA: Diagnosis not present

## 2018-03-13 NOTE — Telephone Encounter (Signed)
Spoke w/ pt and he is going to send his remote transmission today.

## 2018-03-13 NOTE — Progress Notes (Signed)
Remote pacemaker transmission.   

## 2018-03-19 ENCOUNTER — Encounter (HOSPITAL_COMMUNITY): Payer: Self-pay

## 2018-03-19 ENCOUNTER — Ambulatory Visit (HOSPITAL_COMMUNITY): Payer: BLUE CROSS/BLUE SHIELD | Attending: Family Medicine

## 2018-03-19 DIAGNOSIS — G8929 Other chronic pain: Secondary | ICD-10-CM | POA: Diagnosis not present

## 2018-03-19 DIAGNOSIS — M25552 Pain in left hip: Secondary | ICD-10-CM | POA: Insufficient documentation

## 2018-03-19 DIAGNOSIS — M6281 Muscle weakness (generalized): Secondary | ICD-10-CM | POA: Insufficient documentation

## 2018-03-19 DIAGNOSIS — M545 Low back pain, unspecified: Secondary | ICD-10-CM

## 2018-03-19 DIAGNOSIS — R29898 Other symptoms and signs involving the musculoskeletal system: Secondary | ICD-10-CM | POA: Diagnosis not present

## 2018-03-19 NOTE — Therapy (Signed)
[place], [place], [address] Phone: [phone]   Fax:  [phone]  Physical Therapy Treatment  Patient Details  Name: [name] MRN: [id number] Date of Birth: 1955-12-01 Referring Provider (PT): Sallee Lange, MD   Encounter Date: 03/19/2018  PT End of Session - 03/19/18 1021    Visit Number  10   Progress note complete 11/19, visit #7   Number of Visits  13   Progress note due visit #17   Date for PT Re-Evaluation  03/26/18   MInireassess complete 03/05/18   Authorization Type  BCBS Other    Authorization Time Period  02/12/18 to 03/26/18    Authorization - Visit Number  10    Authorization - Number of Visits  30    PT Start Time  0950    PT Stop Time  1028    PT Time Calculation (min)  38 min    Activity Tolerance  Patient tolerated treatment well;Patient limited by pain;No increased pain   Pain scale average 4-5/10, no reports of increased pain   Behavior During Therapy  WFL for tasks assessed/performed       Past Medical History:  Diagnosis Date  . ADD (attention deficit disorder)    Rx-Adderall  . Anginal pain ([place])   . AV block, 3rd degree (HCC)   . Colon polyps 2011   tubular adenoma by excisional biopsy during colonoscopy  . Complete heart block [facility])    a. s/p PPM Placement in 12/2012 with Medtronic CRT-P placement in 03/2014  . Coronary artery disease    a. s/p PCI of the RCA in 2014  . Degenerative disc disease, cervical   . Gastrointestinal bleeding 10/15/2017  . GERD (gastroesophageal reflux disease)   . Hypertension   . Pacemaker   . Palpitations 2003   Holter in 2003: PACs and PVCs; negative stress nuclear test in 2005; right bundle branch block; echo in 2008-mild LVH, otherwise normal; 2008-negative stress nuclear test.  . Pneumonia   . Prostatitis    prostate calcifications by CT  . Sleep apnea    questionable diagnosis    Past Surgical History:  Procedure  Laterality Date  . ANTERIOR CERVICAL DECOMP/DISCECTOMY FUSION    . BACK SURGERY    . Biventricular pacemaker upgrade/ system revision  04/02/14   removal of previous atrial and ventricular leads with placement of a new MDT Consulta CRT-P system at [facility] by Dr Violet Baldy  . COLONOSCOPY    . COLONOSCOPY W/ POLYPECTOMY  2011   tubular adenoma  . ESOPHAGOGASTRODUODENOSCOPY     Gastritis  . INSERT / REPLACE / REMOVE PACEMAKER  12/17/2012   MDT Adapta L implanted by Dr Rayann Heman for mobitz II second degree AV block  . KNEE SURGERY Right   . LEFT HEART CATHETERIZATION WITH CORONARY ANGIOGRAM N/A 10/17/2012   Procedure: LEFT HEART CATHETERIZATION WITH CORONARY ANGIOGRAM;  Surgeon: Josue Hector, MD;  Location: [facility] CATH LAB;  Service: Cardiovascular;  Laterality: N/A;  . LEFT HEART CATHETERIZATION WITH CORONARY ANGIOGRAM N/A 12/17/2012   Procedure: LEFT HEART CATHETERIZATION WITH CORONARY ANGIOGRAM;  Surgeon: Peter M Martinique, MD;  Location: [facility] CATH LAB;  Service: Cardiovascular;  Laterality: N/A;  . LEFT HEART CATHETERIZATION WITH CORONARY ANGIOGRAM N/A 06/11/2013   Procedure: LEFT HEART CATHETERIZATION WITH CORONARY ANGIOGRAM;  Surgeon: Wellington Hampshire, MD;  Location: [facility] CATH LAB;  Service: Cardiovascular;  Laterality: N/A;  . PERCUTANEOUS CORONARY STENT INTERVENTION (PCI-S)  10/17/2012  Procedure: PERCUTANEOUS CORONARY STENT INTERVENTION (PCI-S);  Surgeon: Josue Hector, MD;  Location: [facility] CATH LAB;  Service: Cardiovascular;;  . PERMANENT PACEMAKER INSERTION N/A 12/17/2012   Procedure: PERMANENT PACEMAKER INSERTION;  Surgeon: Thompson Grayer, MD;  Location: [facility] CATH LAB;  Service: Cardiovascular;  Laterality: N/A;  . POLYPECTOMY    . UPPER GASTROINTESTINAL ENDOSCOPY      There were no vitals filed for this visit.  Subjective Assessment - 03/19/18 0956    Subjective  Pt reports minimal changes with therapy, current pain scale 5/10.  Stated wife plans to begin yoga.  Reports positive PT  session but has not been compliant with HEP due to [facility] and busy with business.    Patient Stated Goals  pain relief and stretching out    Currently in Pain?  Yes    Pain Score  5     Pain Location  Hip    Pain Orientation  Left    Pain Descriptors / Indicators  Sharp    Pain Type  Chronic pain    Pain Radiating Towards  Radicular symptoms down to Lt foot    Pain Frequency  Constant    Aggravating Factors   sitting    Pain Relieving Factors  walking    Effect of Pain on Daily Activities  increases                       [facility] Adult PT Treatment/Exercise - 03/19/18 0001      Lumbar Exercises: Stretches   Other Lumbar Stretch Exercise  childs pose 3x 30" with UE in IR and chin to chest      Lumbar Exercises: Standing   Functional Squats  10 reps;Limitations    Functional Squats Limitations  3D hip excursion    Other Standing Lumbar Exercises  bil SLS vectors 5RTx 5" on foam    Other Standing Lumbar Exercises  warrior pose I and II 2 x30" each      Lumbar Exercises: Sidelying   Clam  Left;15 reps    Clam Limitations  belted iso clams x10-15" holds               PT Short Term Goals - 03/05/18 1243      PT SHORT TERM GOAL #1   Title  Pt will have improved proximal hip MMT to 4+/5 in order to decrease LBP and improve functional mobility.    Baseline  03/05/18: see MMT    Status  On-going      PT SHORT TERM GOAL #2   Title  Pt will have improved bil HS length to 150deg in order to demo improved flexibility and reduce LBP.    Baseline  11/19: see flexibility    Status  On-going      PT SHORT TERM GOAL #3   Title  Pt will be able to perform L SLS for 60sec without increases in L hip pain to demo improved core strength, L hip strength, and L hip endurance in order to maximize his ambulation.    Baseline  03/05/18:  SLS Rt 60" first attempt; Lt average 32.66" (28", 23", 47")    Status  On-going      PT SHORT TERM GOAL #4   Title  Pt will be able to sit for  10 mins comfortably to demo improved tolerance to this position and allow him to take a short drive comfortably.    Baseline  03/05/18: Able to sit for 5  min without medication, increased tolerance to 20 min with medication (was 1 min)    Status  On-going        PT Long Term Goals - 02/14/18 7371      PT LONG TERM GOAL #1   Title  Pt will have improved proximal hip MMT to 5/5 in order to further reduce LBP and LLE symptoms and maximize his tolerance to ambulation.    Time  6    Period  Weeks    Status  On-going      PT LONG TERM GOAL #2   Title  Pt will be able to sit for 30 mins comfortably to allow him to enjoy a meal with his family.    Time  6    Period  Weeks    Status  On-going      PT LONG TERM GOAL #3   Title  Pt will report being able to walk for 2 miles or longer with his wife and dogs without increases in pain in order to demo improved functional strength and tolerance to functional tasks to promote return to PLOF.     Time  6    Period  Weeks    Status  On-going      PT LONG TERM GOAL #4   Title  Pt will have reduced average daily pain to 2/10 or less to allow him to perform all functional and [place] activities with less pain and maximize return to PLOF.    Time  6    Period  Weeks    Status  On-going            Plan - 03/19/18 1029    Clinical Impression Statement  Session focus on lumbar mobility and gluteal strengthening.  Pt reports him and wife plan on beginning yoga exercises at home.  Stated he has not been compliant wiht HEP though does know the importance so doing activities with wife will be more compliant with as she is motivating.  Added warrior poses for stability and balance training.  Pt required no manual this session.  Able to complete all exercises wtih no reports of increased pain.      Rehab Potential  Good    PT Frequency  2x / week    PT Duration  6 weeks    PT Treatment/Interventions  ADLs/Self Care Home Management;Aquatic  Therapy;Cryotherapy;Electrical Stimulation;Moist Heat;Traction;Ultrasound;Gait training;Stair training;Functional mobility training;Therapeutic activities;Therapeutic exercise;Balance training;Neuromuscular re-education;Patient/family education;Manual techniques;Passive range of motion;Dry needling;Energy conservation;Taping;Spinal Manipulations;Joint Manipulations    PT Next Visit Plan  soft tissue work should only be performed with ball or the stick (IASTM only) due to patient preference; Continue with child's pose with BUE IR for lats stretch and lumbar mobility; consider adding modified deadlifts and/or bend over rows for posterior chain and lower back strengthening; Continue with thoracic, lumbar mobility, manual and spinal mobility with PT PRN. Progress functional LE strengthening and core exercises    PT Home Exercise Plan  eval: supine HSS (hands behind knee), SKTC, supine figure 4 10/31:  piriformis stretch, bridge, clams, SLR; 11/5: POE; 11/14: hip abd, diagonals, sidestep BTB       Patient will benefit from skilled therapeutic intervention in order to improve the following deficits and impairments:  Abnormal gait, Decreased activity tolerance, Decreased endurance, Decreased mobility, Decreased range of motion, Decreased strength, Difficulty walking, Hypomobility, Increased fascial restricitons, Increased muscle spasms, Impaired flexibility, Pain  Visit Diagnosis: Chronic bilateral low back pain, unspecified whether sciatica present  Pain in  left hip  Muscle weakness (generalized)  Other symptoms and signs involving the musculoskeletal system     Problem List Patient Active Problem List   Diagnosis Date Noted  . Ischemic colitis ([place])   . Hematochezia 10/15/2017  . Hyperlipidemia 01/04/2016  . Anxiety as acute reaction to exceptional stress 05/28/2015  . OSA (obstructive sleep apnea) 10/16/2013  . Obstructive sleep apnea 08/25/2013  . BPH (benign prostatic hyperplasia)  08/25/2013  . Thoracic back pain 08/25/2013  . Chest pain, exertional 12/26/2012  . Intermediate coronary syndrome ([place]) 12/17/2012  . Bradycardia 12/17/2012  . Mobitz type II atrioventricular block 12/17/2012  . Coronary atherosclerosis of native coronary artery 10/18/2012  . S/P drug eluting coronary stent placement 10/18/2012  . Exertional dyspnea 12/26/2011  . Abdominal  pain 12/26/2011  . Hypertension   . ADD (attention deficit disorder)   . Palpitations   . GERD (gastroesophageal reflux disease) 12/20/2011  . Hx of adenomatous colonic polyps 12/20/2011   Ihor Austin, LPTA; CBIS [phone]  Aldona Lento 03/19/2018, 12:43 PM  [facility] [address] [place], [place], [address] Phone: [phone]   Fax:  [phone]  Name: [name] MRN: [id number] Date of Birth: February 04, 1956

## 2018-03-21 ENCOUNTER — Encounter: Payer: Self-pay | Admitting: Cardiology

## 2018-03-21 ENCOUNTER — Ambulatory Visit (HOSPITAL_COMMUNITY): Payer: BLUE CROSS/BLUE SHIELD

## 2018-03-21 ENCOUNTER — Encounter (HOSPITAL_COMMUNITY): Payer: Self-pay

## 2018-03-21 DIAGNOSIS — M25552 Pain in left hip: Secondary | ICD-10-CM

## 2018-03-21 DIAGNOSIS — G8929 Other chronic pain: Secondary | ICD-10-CM

## 2018-03-21 DIAGNOSIS — R29898 Other symptoms and signs involving the musculoskeletal system: Secondary | ICD-10-CM | POA: Diagnosis not present

## 2018-03-21 DIAGNOSIS — M545 Low back pain, unspecified: Secondary | ICD-10-CM

## 2018-03-21 DIAGNOSIS — M6281 Muscle weakness (generalized): Secondary | ICD-10-CM | POA: Diagnosis not present

## 2018-03-21 NOTE — Therapy (Addendum)
[name], [place], [address] Phone: [phone]   Fax:  [phone]   PHYSICAL THERAPY DISCHARGE SUMMARY  Visits from Start of Care: 11  Current functional level related to goals / functional outcomes: See below   Remaining deficits: See below   Education / Equipment: continue HEP  Plan: Patient agrees to discharge.  Patient goals were partially met. Patient is being discharged due to the patient's request.  ?????   Due to lack of progress, pt will be d/c to his HEP. This PT discussed early d/c with the PTA prior to this pt's session and agree that he is appropriate for early d/c due to lack of progress.   Geraldine Solar PT, DPT   Physical Therapy Treatment  Patient Details  Name: [name] MRN: [id number] Date of Birth: October 13, 1955 Referring Provider (PT): Sallee Lange, MD   Encounter Date: 03/21/2018  PT End of Session - 03/21/18 1026    Visit Number  11    Number of Visits  13    Date for PT Re-Evaluation  03/26/18    Authorization Type  [employer] Other    Authorization Time Period  02/12/18 to 03/26/18    Authorization - Visit Number  11    Authorization - Number of Visits  30    PT Start Time  8299    PT Stop Time  3716    PT Time Calculation (min)  35 min    Activity Tolerance  Patient tolerated treatment well;Patient limited by pain;No increased pain   Pain scale range 4-5/10 Lt hip through session   Behavior During Therapy  [facility] for tasks assessed/performed       Past Medical History:  Diagnosis Date  . ADD (attention deficit disorder)    Rx-Adderall  . Anginal pain ([place])   . AV block, 3rd degree (HCC)   . Colon polyps 2011   tubular adenoma by excisional biopsy during colonoscopy  . Complete heart block [facility])    a. s/p PPM Placement in 12/2012 with Medtronic CRT-P placement in 03/2014  . Coronary artery disease    a. s/p PCI of the RCA in 2014  . Degenerative disc disease,  cervical   . Gastrointestinal bleeding 10/15/2017  . GERD (gastroesophageal reflux disease)   . Hypertension   . Pacemaker   . Palpitations 2003   Holter in 2003: PACs and PVCs; negative stress nuclear test in 2005; right bundle branch block; echo in 2008-mild LVH, otherwise normal; 2008-negative stress nuclear test.  . Pneumonia   . Prostatitis    prostate calcifications by CT  . Sleep apnea    questionable diagnosis    Past Surgical History:  Procedure Laterality Date  . ANTERIOR CERVICAL DECOMP/DISCECTOMY FUSION    . BACK SURGERY    . Biventricular pacemaker upgrade/ system revision  04/02/14   removal of previous atrial and ventricular leads with placement of a new MDT Consulta CRT-P system at [facility] by Dr Violet Baldy  . COLONOSCOPY    . COLONOSCOPY W/ POLYPECTOMY  2011   tubular adenoma  . ESOPHAGOGASTRODUODENOSCOPY     Gastritis  . INSERT / REPLACE / REMOVE PACEMAKER  12/17/2012   MDT Adapta L implanted by Dr Rayann Heman for mobitz II second degree AV block  . KNEE SURGERY Right   . LEFT HEART CATHETERIZATION WITH CORONARY ANGIOGRAM N/A 10/17/2012   Procedure: LEFT HEART CATHETERIZATION WITH CORONARY ANGIOGRAM;  Surgeon: Josue Hector, MD;  Location:  [facility] CATH LAB;  Service: Cardiovascular;  Laterality: N/A;  . LEFT HEART CATHETERIZATION WITH CORONARY ANGIOGRAM N/A 12/17/2012   Procedure: LEFT HEART CATHETERIZATION WITH CORONARY ANGIOGRAM;  Surgeon: Peter M Martinique, MD;  Location: [facility] CATH LAB;  Service: Cardiovascular;  Laterality: N/A;  . LEFT HEART CATHETERIZATION WITH CORONARY ANGIOGRAM N/A 06/11/2013   Procedure: LEFT HEART CATHETERIZATION WITH CORONARY ANGIOGRAM;  Surgeon: Wellington Hampshire, MD;  Location: [facility] CATH LAB;  Service: Cardiovascular;  Laterality: N/A;  . PERCUTANEOUS CORONARY STENT INTERVENTION (PCI-S)  10/17/2012   Procedure: PERCUTANEOUS CORONARY STENT INTERVENTION (PCI-S);  Surgeon: Josue Hector, MD;  Location: [facility] CATH LAB;  Service: Cardiovascular;;  .  PERMANENT PACEMAKER INSERTION N/A 12/17/2012   Procedure: PERMANENT PACEMAKER INSERTION;  Surgeon: Thompson Grayer, MD;  Location: [facility] CATH LAB;  Service: Cardiovascular;  Laterality: N/A;  . POLYPECTOMY    . UPPER GASTROINTESTINAL ENDOSCOPY      There were no vitals filed for this visit.  Subjective Assessment - 03/21/18 0951    Subjective  Pt stated he has increased pain in Lt hip and double hernia pain today, 5.5/10.  Pt feels he has made gains with flexibilty and balance but not the pain due to the tumor.  Has plans with wife to begin yoga and pilates more regularly.    Pertinent History  pacemaker    How long can you sit comfortably?  12/05 able to sit comfortably 5 min without medication (was Able to sit for 5 min without medication, increased tolerance to 20 min with medication (was 1 min)    How long can you stand comfortably?  12/05: Standing doesnt bother him as much, does have Lt LE pain wiht standing maybe 5-10 minutes (was always hurting)    How long can you walk comfortably?  03/21/18: Able to walk a couple miles easily with pain, does c/o Lt foot numbness (was >52mle (used to do 6 miles))    Patient Stated Goals  pain relief and stretching out    Currently in Pain?  Yes    Pain Location  Hip    Pain Orientation  Left    Pain Descriptors / Indicators  Sharp    Pain Type  Chronic pain    Pain Radiating Towards  Radicular symptoms down to Lt foot and up back    Pain Onset  More than a month ago    Pain Frequency  Constant    Aggravating Factors   sitting     Pain Relieving Factors  walking    Effect of Pain on Daily Activities  increases         [facility] PT Assessment - 03/21/18 0001      Assessment   Medical Diagnosis  L sided sciatica, L leg pain    Referring Provider (PT)  SSallee Lange MD    Onset Date/Surgical Date  --   about 1 years ago   Next MD Visit  after therapy     Prior Therapy  none      AROM   Lumbar Flexion  WFL pain free, tightness posterior leg   was  25% limited, pain in lordoris   Lumbar Extension  WNL    Lumbar - Right Side Bend  knee joint, stretch    Lumbar - Left Side Bend  knee joint, stretch    Lumbar - Right Rotation  WFL, nonpainful    Lumbar - Left Rotation  WFL, nonpainful      Strength   Right Hip Flexion  5/5    Right Hip Extension  4+/5   was 4+   Right Hip ABduction  5/5   was 4+/5   Left Hip Flexion  5/5   was 4+/5   Left Hip Extension  4+/5   was 4+   Left Hip ABduction  4+/5   was 4/5     Flexibility   Hamstrings  90/90: R: 146deg L:149 deg   was 90/90: R: 146deg L:143 deg                  [facility] Adult PT Treatment/Exercise - 03/21/18 0001      Lumbar Exercises: Stretches   Active Hamstring Stretch  3 reps;30 seconds;Left    Active Hamstring Stretch Limitations  supine with rope      Lumbar Exercises: Standing   Functional Squats  10 reps;Limitations    Other Standing Lumbar Exercises  SLS 60" BLE first attempt; bil SLS vectors 5RTx 5" on foam               PT Short Term Goals - 03/21/18 0955      PT SHORT TERM GOAL #1   Title  Pt will have improved proximal hip MMT to 4+/5 in order to decrease LBP and improve functional mobility.    Baseline  03/21/18: 03/05/18: see MMT    Status  Achieved      PT SHORT TERM GOAL #2   Title  Pt will have improved bil HS length to 150deg in order to demo improved flexibility and reduce LBP.    Baseline  03/21/18: see flexibilty 11/19: see flexibility    Status  On-going      PT SHORT TERM GOAL #3   Title  Pt will be able to perform L SLS for 60sec without increases in L hip pain to demo improved core strength, L hip strength, and L hip endurance in order to maximize his ambulation.    Baseline  03/21/18: BLE 60" first attempt; 03/05/18:  SLS Rt 60" first attempt; Lt average 32.66" (28", 23", 47")    Status  Achieved      PT SHORT TERM GOAL #4   Title  Pt will be able to sit for 10 mins comfortably to demo improved tolerance to this  position and allow him to take a short drive comfortably.    Baseline  03/05/18: Able to sit for 5 min without medication, increased tolerance to 20 min with medication (was 1 min)    Status  Not Met        PT Long Term Goals - 03/21/18 1017      PT LONG TERM GOAL #1   Title  Pt will have improved proximal hip MMT to 5/5 in order to further reduce LBP and LLE symptoms and maximize his tolerance to ambulation.    Baseline  03/21/18:  see MMT    Status  Partially Met      PT LONG TERM GOAL #2   Title  Pt will be able to sit for 30 mins comfortably to allow him to enjoy a meal with his family.    Baseline  03/21/18:  5 minutes without medication    Status  Not Met      PT LONG TERM GOAL #3   Title  Pt will report being able to walk for 2 miles or longer with his wife and dogs without increases in pain in order to demo improved functional strength and tolerance to functional tasks to  promote return to PLOF.     Baseline  03/21/18:  Reports ability to walk a couple miles, continues to be limited by pain in Lt hip    Status  Partially Met      PT LONG TERM GOAL #4   Title  Pt will have reduced average daily pain to 2/10 or less to allow him to perform all functional and [place] activities with less pain and maximize return to PLOF.    Baseline  03/21/18: average pain scale 2-6/10, average 4-5/10    Status  On-going            Plan - 03/21/18 1231    Clinical Impression Statement  Pt reports he continues to be limited with Lt hip pain and hernia pain today.  Following discussion with pt., reviewed goals as reports no improvements with hip pain through session.  Pt does present with improve hip strength except for glut max which remains 4+/5 BLE, improved Bil flexibility, improved balalnce and reports of improved tolerance with ability to ambulate for at least 2 miles.  Pt admits he has not been compliant with HEP, he feels he will be more compliant with group yoga activities with wife and  plans to begin at home.      Rehab Potential  Good    PT Frequency  2x / week    PT Duration  6 weeks    PT Treatment/Interventions  ADLs/Self Care Home Management;Aquatic Therapy;Cryotherapy;Electrical Stimulation;Moist Heat;Traction;Ultrasound;Gait training;Stair training;Functional mobility training;Therapeutic activities;Therapeutic exercise;Balance training;Neuromuscular re-education;Patient/family education;Manual techniques;Passive range of motion;Dry needling;Energy conservation;Taping;Spinal Manipulations;Joint Manipulations    PT Next Visit Plan  DC to HEP per pt. request    PT Home Exercise Plan  eval: supine HSS (hands behind knee), SKTC, supine figure 4 10/31:  piriformis stretch, bridge, clams, SLR; 11/5: POE; 11/14: hip abd, diagonals, sidestep BTB       Patient will benefit from skilled therapeutic intervention in order to improve the following deficits and impairments:  Abnormal gait, Decreased activity tolerance, Decreased endurance, Decreased mobility, Decreased range of motion, Decreased strength, Difficulty walking, Hypomobility, Increased fascial restricitons, Increased muscle spasms, Impaired flexibility, Pain  Visit Diagnosis: Chronic bilateral low back pain, unspecified whether sciatica present  Pain in left hip  Muscle weakness (generalized)  Other symptoms and signs involving the musculoskeletal system     Problem List Patient Active Problem List   Diagnosis Date Noted  . Ischemic colitis ([place])   . Hematochezia 10/15/2017  . Hyperlipidemia 01/04/2016  . Anxiety as acute reaction to exceptional stress 05/28/2015  . OSA (obstructive sleep apnea) 10/16/2013  . Obstructive sleep apnea 08/25/2013  . BPH (benign prostatic hyperplasia) 08/25/2013  . Thoracic back pain 08/25/2013  . Chest pain, exertional 12/26/2012  . Intermediate coronary syndrome ([place]) 12/17/2012  . Bradycardia 12/17/2012  . Mobitz type II atrioventricular block 12/17/2012  . Coronary  atherosclerosis of native coronary artery 10/18/2012  . S/P drug eluting coronary stent placement 10/18/2012  . Exertional dyspnea 12/26/2011  . Abdominal  pain 12/26/2011  . Hypertension   . ADD (attention deficit disorder)   . Palpitations   . GERD (gastroesophageal reflux disease) 12/20/2011  . Hx of adenomatous colonic polyps 12/20/2011   [name], LPTA; CBIS [phone]  [name] 03/21/2018, 12:40 PM  [place], [place], [address] Phone: [phone]   Fax:  [phone]  Name: [name] MRN: [id number] Date of Birth: December 29, 1955

## 2018-04-13 ENCOUNTER — Other Ambulatory Visit: Payer: Self-pay | Admitting: Cardiovascular Disease

## 2018-04-16 DIAGNOSIS — F4312 Post-traumatic stress disorder, chronic: Secondary | ICD-10-CM | POA: Diagnosis not present

## 2018-04-20 ENCOUNTER — Other Ambulatory Visit: Payer: Self-pay | Admitting: Family Medicine

## 2018-04-25 ENCOUNTER — Encounter: Payer: Self-pay | Admitting: Family Medicine

## 2018-04-25 ENCOUNTER — Ambulatory Visit (INDEPENDENT_AMBULATORY_CARE_PROVIDER_SITE_OTHER): Payer: BLUE CROSS/BLUE SHIELD | Admitting: Family Medicine

## 2018-04-25 VITALS — BP 138/84 | Temp 97.8°F | Ht 71.0 in | Wt 229.0 lb

## 2018-04-25 DIAGNOSIS — R42 Dizziness and giddiness: Secondary | ICD-10-CM

## 2018-04-25 DIAGNOSIS — I159 Secondary hypertension, unspecified: Secondary | ICD-10-CM | POA: Diagnosis not present

## 2018-04-25 DIAGNOSIS — E785 Hyperlipidemia, unspecified: Secondary | ICD-10-CM | POA: Diagnosis not present

## 2018-04-25 DIAGNOSIS — R110 Nausea: Secondary | ICD-10-CM | POA: Diagnosis not present

## 2018-04-25 DIAGNOSIS — Z1159 Encounter for screening for other viral diseases: Secondary | ICD-10-CM

## 2018-04-25 DIAGNOSIS — Z125 Encounter for screening for malignant neoplasm of prostate: Secondary | ICD-10-CM

## 2018-04-25 MED ORDER — PROMETHAZINE HCL 25 MG PO TABS: ORAL_TABLET | ORAL | 2 refills | Status: DC

## 2018-04-25 NOTE — Progress Notes (Signed)
Subjective:    Patient ID: [name], male    DOB: Apr 21, 1955, 63 y.o.   MRN: [id number]  HPI Patient is here today with complaints nausea off and on lately.He states he has been seen for this in the past and was started on Zofran 8 mg one po prn nausea.He says this is not helping.He states he has bouts of dizziness and then the nausea hits.   He states he is having some ringing in the left ear off and on more as of late. He feels there is something wrong with the eustation tube.  He also has left leg pain. 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 Has been seen by neurosurgeon and recommended Pt. He has finished the pt that he did for six weeks.     Office Visit from 05/10/2017 in [facility]  PHQ-9 Total Score  3     GAD 7 : Generalized Anxiety Score 05/10/2017  Nervous, Anxious, on Edge 1  Control/stop worrying 1  Worry too much - different things 1  Trouble relaxing 0  Restless 0  Easily annoyed or irritable 0  Afraid - awful might happen 0  Total GAD 7 Score 3  Anxiety Difficulty Not difficult at all      Review of Systems  Constitutional: Negative for activity change, chills and fever.  HENT: Positive for congestion and rhinorrhea. Negative for ear pain.   Eyes: Negative for discharge.  Respiratory: Positive for cough. Negative for wheezing.   Cardiovascular: Negative for chest pain.  Gastrointestinal: Negative for nausea and vomiting.  Musculoskeletal: Negative for arthralgias.       Objective:   Physical Exam Vitals signs reviewed.  Constitutional:      General: He is not in acute distress. HENT:     Head: Normocephalic and atraumatic.  Eyes:     General:        Right eye: No discharge.        Left eye: No discharge.  Neck:     Trachea: No tracheal deviation.  Cardiovascular:     Rate and Rhythm: Normal rate and regular rhythm.     Heart sounds: Normal heart sounds. No murmur.  Pulmonary:     Effort:  Pulmonary effort is normal. No respiratory distress.     Breath sounds: Normal breath sounds.  Lymphadenopathy:     Cervical: No cervical adenopathy.  Skin:    General: Skin is warm and dry.  Neurological:     Mental Status: He is alert.     Coordination: Coordination normal.  Psychiatric:        Behavior: Behavior normal.           Assessment & Plan:  Persistent nausea We will proceed forward with Phenergan as needed for the nausea May well need to do CT scan of the head to rule out tumor ingrowth Patient not a candidate for MRI because of pacemaker  Intermittent dizziness as well as some inner ear findings as well as tinnitus noise in the left ear, not sure if there could be a component of Mnire's disease I recommend the patient be seen by ENT for further and thorough evaluation.  It is quite possible patient will also need neurology consultation because of the symptomatology falls into the neurology category  As for the leg pain and discomfort more than likely this is an impingement of the nerve from this tumor growth he will see back  specialist that he is already established with they were talking about doing EMG studies to see what level of impingement in the nerve is going on which is a reasonable step  Finally the patient will taper down on Lyrica and he will let us know if that helps alleviate some of his nausea and dizziness  Patient will follow-up here in 1 month  Patient is doing counseling for posttraumatic stress disorder associated with his [place] duty he does have some anxiety and depression but does not want to be on medicines he will consider and Zoloft and Celexa we will discuss more in 1 month he denies being homicidal or suicidal  25 minutes was spent with the patient.  This statement verifies that 25 minutes was indeed spent with the patient.  More than 50% of this visit-total duration of the visit-was spent in counseling and coordination of care. The  issues that the patient came in for today as reflected in the diagnosis (s) please refer to documentation for further details.

## 2018-05-03 LAB — CUP PACEART REMOTE DEVICE CHECK
Date Time Interrogation Session: 20200117163348
Implantable Lead Implant Date: 20151217
Implantable Lead Implant Date: 20151217
Implantable Lead Implant Date: 20151217
Implantable Lead Location: 753858
Implantable Lead Location: 753859
Implantable Lead Location: 753860
Implantable Lead Model: 4196
Implantable Lead Model: 5076
Implantable Lead Model: 5076
Implantable Pulse Generator Implant Date: 20151217

## 2018-05-06 ENCOUNTER — Encounter: Payer: Self-pay | Admitting: Family Medicine

## 2018-05-08 DIAGNOSIS — Z1159 Encounter for screening for other viral diseases: Secondary | ICD-10-CM | POA: Diagnosis not present

## 2018-05-08 DIAGNOSIS — Z125 Encounter for screening for malignant neoplasm of prostate: Secondary | ICD-10-CM | POA: Diagnosis not present

## 2018-05-08 DIAGNOSIS — E785 Hyperlipidemia, unspecified: Secondary | ICD-10-CM | POA: Diagnosis not present

## 2018-05-08 DIAGNOSIS — I159 Secondary hypertension, unspecified: Secondary | ICD-10-CM | POA: Diagnosis not present

## 2018-05-09 LAB — BASIC METABOLIC PANEL
BUN/Creatinine Ratio: 12 (ref 10–24)
BUN: 13 mg/dL (ref 8–27)
CO2: 25 mmol/L (ref 20–29)
Calcium: 9.8 mg/dL (ref 8.6–10.2)
Chloride: 104 mmol/L (ref 96–106)
Creatinine, Ser: 1.08 mg/dL (ref 0.76–1.27)
GFR calc Af Amer: 84 mL/min/{1.73_m2} (ref >59)
GFR calc non Af Amer: 73 mL/min/{1.73_m2} (ref >59)
Glucose: 122 mg/dL — ABNORMAL HIGH (ref 65–99)
Potassium: 4.4 mmol/L (ref 3.5–5.2)
Sodium: 143 mmol/L (ref 134–144)

## 2018-05-09 LAB — LIPID PANEL
Chol/HDL Ratio: 2.3 ratio (ref 0.0–5.0)
Cholesterol, Total: 119 mg/dL (ref 100–199)
HDL: 52 mg/dL (ref >39)
LDL Calculated: 54 mg/dL (ref 0–99)
Triglycerides: 64 mg/dL (ref 0–149)
VLDL Cholesterol Cal: 13 mg/dL (ref 5–40)

## 2018-05-09 LAB — HEPATITIS C ANTIBODY: Hep C Virus Ab: <0.1 s/co ratio (ref 0.0–0.9)

## 2018-05-09 LAB — PSA: Prostate Specific Ag, Serum: 1.5 ng/mL (ref 0.0–4.0)

## 2018-05-16 ENCOUNTER — Other Ambulatory Visit: Payer: Self-pay | Admitting: Family Medicine

## 2018-05-21 DIAGNOSIS — F4312 Post-traumatic stress disorder, chronic: Secondary | ICD-10-CM | POA: Diagnosis not present

## 2018-05-23 ENCOUNTER — Encounter: Payer: Self-pay | Admitting: Family Medicine

## 2018-05-23 ENCOUNTER — Ambulatory Visit (INDEPENDENT_AMBULATORY_CARE_PROVIDER_SITE_OTHER): Payer: BLUE CROSS/BLUE SHIELD | Admitting: Family Medicine

## 2018-05-23 VITALS — BP 150/82 | Ht 71.0 in | Wt 229.0 lb

## 2018-05-23 DIAGNOSIS — R110 Nausea: Secondary | ICD-10-CM | POA: Diagnosis not present

## 2018-05-23 DIAGNOSIS — M5432 Sciatica, left side: Secondary | ICD-10-CM | POA: Diagnosis not present

## 2018-05-23 DIAGNOSIS — R42 Dizziness and giddiness: Secondary | ICD-10-CM

## 2018-05-23 MED ORDER — OXYCODONE-ACETAMINOPHEN 10-325 MG PO TABS: 1.0000 | ORAL_TABLET | ORAL | 0 refills | Status: DC | PRN

## 2018-05-23 NOTE — Progress Notes (Signed)
   Subjective:    Patient ID: [name], male    DOB: 01-03-1956, 63 y.o.   MRN: [id number]  HPI  Patient is here today for a follow up from last visit on 04/25/2018.  Patient at that time was having some intermittent dizziness as well as some inner ear findings with tinnitus noise in the left ear.(Was not sure if Meniere's Disease, and Ent was suggested to further investigate.)  He states the ENT had called him and scheduled an appointment,but they called the pt and cancelled and he has yet to reschedule. The patient states he still having the dizzy feeling when he gets this he also has significant nausea he denies headache with it does not wake him up at night it occurs intermittently throughout the day  The patient also has sciatica pain and discomfort.  Unable to tolerate the pain on some days otherwise he states that he is able to tolerate it when he does take pain medicine when the pain is at its most severe  Per patient no changes he still has all the symptoms dizziness and the ringing in the left ear.  Patient is still dealing with depression related issues but is seeing counselor and not suicidal  Review of Systems  Constitutional: Negative for activity change.  HENT: Negative for congestion and rhinorrhea.   Respiratory: Negative for cough and shortness of breath.   Cardiovascular: Negative for chest pain.  Gastrointestinal: Negative for abdominal pain, diarrhea, nausea and vomiting.  Genitourinary: Negative for dysuria and hematuria.  Neurological: Negative for weakness and headaches.  Psychiatric/Behavioral: Negative for behavioral problems and confusion.       Objective:   Physical Exam Vitals signs reviewed.  Constitutional:      General: He is not in acute distress. HENT:     Head: Normocephalic and atraumatic.  Eyes:     General:        Right eye: No discharge.        Left eye: No discharge.  Neck:     Trachea: No tracheal deviation.  Cardiovascular:       Rate and Rhythm: Normal rate and regular rhythm.     Heart sounds: Normal heart sounds. No murmur.  Pulmonary:     Effort: Pulmonary effort is normal. No respiratory distress.     Breath sounds: Normal breath sounds.  Lymphadenopathy:     Cervical: No cervical adenopathy.  Skin:    General: Skin is warm and dry.  Neurological:     Mental Status: He is alert.     Coordination: Coordination normal.  Psychiatric:        Behavior: Behavior normal.           Assessment & Plan:  Leg sciatica back sciatica uses pain medicine infrequently Prescription given for the patient patient uses sparingly.  Hyperlipidemia recent lab work looks good  Recent PSA looks good  Patient will be seeing ENT in the near future if they are unable to help him we will need to set him up with neurology-persistent nausea as well  Patient will consider getting EMG study done.  This will be through neurosurgery  Patient will continue his medications try to eat healthy and he will follow-up in approximately 4 months

## 2018-06-10 DIAGNOSIS — G245 Blepharospasm: Secondary | ICD-10-CM | POA: Diagnosis not present

## 2018-06-10 DIAGNOSIS — R42 Dizziness and giddiness: Secondary | ICD-10-CM | POA: Insufficient documentation

## 2018-06-10 DIAGNOSIS — H833X3 Noise effects on inner ear, bilateral: Secondary | ICD-10-CM | POA: Insufficient documentation

## 2018-06-10 DIAGNOSIS — I442 Atrioventricular block, complete: Secondary | ICD-10-CM | POA: Diagnosis not present

## 2018-06-10 DIAGNOSIS — H903 Sensorineural hearing loss, bilateral: Secondary | ICD-10-CM | POA: Diagnosis not present

## 2018-06-11 DIAGNOSIS — M5442 Lumbago with sciatica, left side: Secondary | ICD-10-CM | POA: Diagnosis not present

## 2018-06-11 DIAGNOSIS — R202 Paresthesia of skin: Secondary | ICD-10-CM | POA: Diagnosis not present

## 2018-06-11 DIAGNOSIS — M79605 Pain in left leg: Secondary | ICD-10-CM | POA: Diagnosis not present

## 2018-06-11 DIAGNOSIS — G8929 Other chronic pain: Secondary | ICD-10-CM | POA: Diagnosis not present

## 2018-06-12 ENCOUNTER — Ambulatory Visit (INDEPENDENT_AMBULATORY_CARE_PROVIDER_SITE_OTHER): Payer: BLUE CROSS/BLUE SHIELD | Admitting: *Deleted

## 2018-06-12 DIAGNOSIS — I441 Atrioventricular block, second degree: Secondary | ICD-10-CM

## 2018-06-14 LAB — CUP PACEART REMOTE DEVICE CHECK
Battery Remaining Longevity: 58 mo
Battery Voltage: 2.99 V
Brady Statistic AP VP Percent: 31.51 %
Brady Statistic AP VS Percent: 0.01 %
Brady Statistic AS VP Percent: 68.46 %
Brady Statistic AS VS Percent: 0.02 %
Brady Statistic RA Percent Paced: 31.49 %
Brady Statistic RV Percent Paced: 99.92 %
Date Time Interrogation Session: 20200226192341
Implantable Lead Implant Date: 20151217
Implantable Lead Implant Date: 20151217
Implantable Lead Implant Date: 20151217
Implantable Lead Location: 753858
Implantable Lead Location: 753859
Implantable Lead Location: 753860
Implantable Lead Model: 4196
Implantable Lead Model: 5076
Implantable Lead Model: 5076
Implantable Pulse Generator Implant Date: 20151217
Lead Channel Impedance Value: 380 Ohm
Lead Channel Impedance Value: 399 Ohm
Lead Channel Impedance Value: 437 Ohm
Lead Channel Impedance Value: 456 Ohm
Lead Channel Impedance Value: 494 Ohm
Lead Channel Impedance Value: 570 Ohm
Lead Channel Impedance Value: 589 Ohm
Lead Channel Impedance Value: 684 Ohm
Lead Channel Impedance Value: 912 Ohm
Lead Channel Pacing Threshold Amplitude: 0.75 V
Lead Channel Pacing Threshold Amplitude: 0.75 V
Lead Channel Pacing Threshold Amplitude: 1 V
Lead Channel Pacing Threshold Pulse Width: 0.4 ms
Lead Channel Pacing Threshold Pulse Width: 0.4 ms
Lead Channel Pacing Threshold Pulse Width: 0.4 ms
Lead Channel Sensing Intrinsic Amplitude: 0.625 mV
Lead Channel Sensing Intrinsic Amplitude: 0.625 mV
Lead Channel Sensing Intrinsic Amplitude: 23.75 mV
Lead Channel Sensing Intrinsic Amplitude: 23.75 mV
Lead Channel Setting Pacing Amplitude: 2 V
Lead Channel Setting Pacing Amplitude: 2.25 V
Lead Channel Setting Pacing Amplitude: 2.5 V
Lead Channel Setting Pacing Pulse Width: 0.4 ms
Lead Channel Setting Pacing Pulse Width: 0.4 ms
Lead Channel Setting Sensing Sensitivity: 0.9 mV

## 2018-06-15 ENCOUNTER — Other Ambulatory Visit: Payer: Self-pay | Admitting: Family Medicine

## 2018-06-16 ENCOUNTER — Other Ambulatory Visit: Payer: Self-pay | Admitting: Family Medicine

## 2018-06-19 ENCOUNTER — Encounter: Payer: Self-pay | Admitting: Cardiology

## 2018-06-28 DIAGNOSIS — Z6831 Body mass index (BMI) 31.0-31.9, adult: Secondary | ICD-10-CM | POA: Diagnosis not present

## 2018-06-28 DIAGNOSIS — D3610 Benign neoplasm of peripheral nerves and autonomic nervous system, unspecified: Secondary | ICD-10-CM | POA: Diagnosis not present

## 2018-06-28 DIAGNOSIS — I10 Essential (primary) hypertension: Secondary | ICD-10-CM | POA: Diagnosis not present

## 2018-07-04 ENCOUNTER — Telehealth: Payer: Self-pay

## 2018-07-04 NOTE — Telephone Encounter (Signed)
Detailed message left for Pt/wife.  Advised at this time no non urgent office visits.  Advised that Pt/wife would be able to have televisit with Dr. Rayann Heman if they would like.  Advised to call Melissa (direct #) given to either confirm televisit or cancel and reschedule routine follow up.

## 2018-07-08 ENCOUNTER — Encounter: Payer: Self-pay | Admitting: Family Medicine

## 2018-07-08 ENCOUNTER — Ambulatory Visit (INDEPENDENT_AMBULATORY_CARE_PROVIDER_SITE_OTHER): Payer: BLUE CROSS/BLUE SHIELD | Admitting: Family Medicine

## 2018-07-08 ENCOUNTER — Other Ambulatory Visit: Payer: Self-pay | Admitting: Family Medicine

## 2018-07-08 ENCOUNTER — Other Ambulatory Visit: Payer: Self-pay

## 2018-07-08 VITALS — HR 84 | Temp 98.8°F | Resp 14

## 2018-07-08 DIAGNOSIS — R062 Wheezing: Secondary | ICD-10-CM

## 2018-07-08 DIAGNOSIS — J019 Acute sinusitis, unspecified: Secondary | ICD-10-CM

## 2018-07-08 DIAGNOSIS — J22 Unspecified acute lower respiratory infection: Secondary | ICD-10-CM

## 2018-07-08 MED ORDER — ALBUTEROL SULFATE HFA 108 (90 BASE) MCG/ACT IN AERS: 2.0000 | INHALATION_SPRAY | Freq: Four times a day (QID) | RESPIRATORY_TRACT | 2 refills | Status: DC | PRN

## 2018-07-08 MED ORDER — AZITHROMYCIN 250 MG PO TABS: ORAL_TABLET | ORAL | 0 refills | Status: DC

## 2018-07-08 NOTE — Progress Notes (Signed)
   Subjective:    Patient ID: [name], male    DOB: 04/23/1955, 63 y.o.   MRN: [id number]  HPI sorethroat started one to two weeks ago. Saw a couple of white spots in throat, just didn't feel good at all, laying around not able to go to work. Tightness in chest for 2 weeks. Cough has started in the past couple of days, every now and then having sob. Sitting in certain positions makes sob worse. Waking up in the middle of the night with sob. No fever. Tried dayquil, nyquil, sudafed.   Patient relates that he is coughing frequently and feels like it is hard to get a deep breath without coughing at the same time he finds himself feeling rundown at times and fatigued denies night sweats.  Has not been using any type of albuterol. Review of Systems  Constitutional: Positive for fatigue. Negative for activity change, chills and fever.  HENT: Negative for congestion, ear pain and rhinorrhea.   Eyes: Negative for discharge.  Respiratory: Positive for cough, shortness of breath and wheezing.   Cardiovascular: Negative for chest pain.  Gastrointestinal: Negative for abdominal pain, diarrhea, nausea and vomiting.  Genitourinary: Negative for dysuria and hematuria.  Musculoskeletal: Negative for arthralgias.  Neurological: Negative for weakness and headaches.  Psychiatric/Behavioral: Negative for behavioral problems and confusion.       Objective:   Physical Exam Vitals signs and nursing note reviewed.  Constitutional:      Appearance: He is well-developed.  HENT:     Head: Normocephalic.     Mouth/Throat:     Pharynx: No oropharyngeal exudate.  Neck:     Musculoskeletal: Normal range of motion.  Cardiovascular:     Rate and Rhythm: Normal rate and regular rhythm.     Heart sounds: Normal heart sounds. No murmur.  Pulmonary:     Effort: Pulmonary effort is normal.     Breath sounds: Normal breath sounds. No wheezing.  Lymphadenopathy:     Cervical: No cervical adenopathy.  Skin:    General: Skin is warm and dry.  Neurological:     Motor: No abnormal muscle tone.     I do not feel the patient needs to do x-rays I do not feel the patient needs to do coronavirus testing      Assessment & Plan:  Patient has more of a bronchial cough currently I do not hear any pneumonia with him.  His O2 saturation 97% he is not respiratory distress therefore I would recommend for this patient to go ahead with azithromycin for the next 5 days as well as albuterol I do not feel the patient has coronavirus.  I did educate the patient that if he starts running high fevers chills sweats or having progressive shortness of breath he needs to notify us and get checked right away follow-up sooner if any problems his wife was also told regarding this and she will give [place] update within the next 24 to 48 hours

## 2018-07-08 NOTE — Telephone Encounter (Signed)
Patient states she is unable to get a good breath from his chest when he stoops or bends. No fever,has a cough with some production green and brown in color, loss of appetite, No vomiting, no diarrhea.Patient was advised to come in today for a tent visit outside at 1pm/

## 2018-07-08 NOTE — Telephone Encounter (Signed)
Nurses-call family Is difficult for [place] to know for certain what is going on here Please find out from the patient and/or wife when the wife is stating that he cannot catch his breath do they mean as an he is short of breath because he cannot breathe through his sinuses or he feels short of breath in his chest? I can offer to see him this afternoon Please explain to family that our sick respiratory patients we are seeing in the outdoor tents Another option is I can do essentially a phone visit which takes the place of an office visit-but limitations exist including inability to listen to the lungs and inability to check oxygen saturation

## 2018-07-10 ENCOUNTER — Encounter: Payer: BLUE CROSS/BLUE SHIELD | Admitting: Internal Medicine

## 2018-07-11 ENCOUNTER — Other Ambulatory Visit: Payer: Self-pay | Admitting: Family Medicine

## 2018-07-11 ENCOUNTER — Encounter: Payer: Self-pay | Admitting: Family Medicine

## 2018-07-11 MED ORDER — AMOXICILLIN-POT CLAVULANATE 875-125 MG PO TABS: ORAL_TABLET | ORAL | 0 refills | Status: DC

## 2018-07-11 NOTE — Telephone Encounter (Signed)
I would finish out the azithromycin We will add Augmentin to cover for other possibilities Augmentin 875 mg 1 twice daily for 10 days No respiratory feel he has when he coughs is probably related to the bronchial irritation I think this could take a good 5 to 10 days to improve and get back to normal certainly if things are getting worse to let us know if he starts running fever significant shortness of breath or difficulty breathing next that would be potentially ER with x-rays and further evaluation  Notify us if any problems keep [place] updated

## 2018-08-08 ENCOUNTER — Ambulatory Visit: Payer: BLUE CROSS/BLUE SHIELD | Admitting: Neurology

## 2018-08-08 ENCOUNTER — Other Ambulatory Visit: Payer: Self-pay | Admitting: Family Medicine

## 2018-08-08 ENCOUNTER — Telehealth: Payer: Self-pay

## 2018-08-08 NOTE — Telephone Encounter (Signed)
Spoke with [name] and pts wife regarding appt on 08/12/18. [name] wife stated [name] will check vitals prior to appt. [name] questions and concerns were address.

## 2018-08-12 ENCOUNTER — Encounter: Payer: Self-pay | Admitting: Family Medicine

## 2018-08-12 ENCOUNTER — Ambulatory Visit (INDEPENDENT_AMBULATORY_CARE_PROVIDER_SITE_OTHER): Payer: BLUE CROSS/BLUE SHIELD | Admitting: Family Medicine

## 2018-08-12 ENCOUNTER — Other Ambulatory Visit: Payer: Self-pay

## 2018-08-12 ENCOUNTER — Telehealth (INDEPENDENT_AMBULATORY_CARE_PROVIDER_SITE_OTHER): Payer: BLUE CROSS/BLUE SHIELD | Admitting: Internal Medicine

## 2018-08-12 VITALS — BP 136/71 | HR 69 | Wt 223.0 lb

## 2018-08-12 DIAGNOSIS — I10 Essential (primary) hypertension: Secondary | ICD-10-CM | POA: Diagnosis not present

## 2018-08-12 DIAGNOSIS — R0600 Dyspnea, unspecified: Secondary | ICD-10-CM

## 2018-08-12 DIAGNOSIS — I25708 Atherosclerosis of coronary artery bypass graft(s), unspecified, with other forms of angina pectoris: Secondary | ICD-10-CM

## 2018-08-12 DIAGNOSIS — R0609 Other forms of dyspnea: Secondary | ICD-10-CM | POA: Diagnosis not present

## 2018-08-12 DIAGNOSIS — I441 Atrioventricular block, second degree: Secondary | ICD-10-CM | POA: Diagnosis not present

## 2018-08-12 DIAGNOSIS — J019 Acute sinusitis, unspecified: Secondary | ICD-10-CM | POA: Diagnosis not present

## 2018-08-12 DIAGNOSIS — R002 Palpitations: Secondary | ICD-10-CM | POA: Diagnosis not present

## 2018-08-12 MED ORDER — NADOLOL 80 MG PO TABS: 120.0000 mg | ORAL_TABLET | Freq: Every day | ORAL | 3 refills | Status: DC

## 2018-08-12 MED ORDER — AMOXICILLIN-POT CLAVULANATE 875-125 MG PO TABS: 1.0000 | ORAL_TABLET | Freq: Two times a day (BID) | ORAL | 0 refills | Status: DC

## 2018-08-12 NOTE — Progress Notes (Signed)
   Subjective:    Patient ID: [name], male    DOB: 07/14/1955, 63 y.o.   MRN: [id number]  HPI  Format -virtual  Patient present at home Provider present at office Consent for interaction obtained Coronavirus outbreak made virtual visit necessary   Patient calls with left ear pain and throat pain for a week. Patient states he also has a lot of drainage and a wet cough with chest pain and SOB. Good discussion with patient having head congestion drainage coughing sinus pressure some pain and discomfort worse in the right ear present over the past few days but denies any other particular troubles no wheezing or difficulty breathing related to the sinus symptoms  He also states he has had several episodes where he is felt chest discomfort and sometimes where he feels like the heart runs little fast and other times that he feels way out of breath with walking he states he walks on a regular basis and he does not feel like he to get that out of breath with those activities it does not happen the same way every time  Dr. Huel Coventry did increase the dose of his beta-blocker today Patient did have a Myoview this past September but overall looked good and had pulmonary function testing approximately a year and a half ago Virtual Visit via Video Note  I connected with [name] on 08/12/18 at  3:00 PM EDT by a video enabled telemedicine application and verified that I am speaking with the correct person using two identifiers.   I discussed the limitations of evaluation and management by telemedicine and the availability of in person appointments. The patient expressed understanding and agreed to proceed.  History of Present Illness:    Observations/Objective:   Assessment and Plan:   Follow Up Instructions:    I discussed the assessment and treatment plan with the patient. The patient was provided an opportunity to ask questions and all were answered. The patient agreed with the  plan and demonstrated an understanding of the instructions.   The patient was advised to call back or seek an in-person evaluation if the symptoms worsen or if the condition fails to improve as anticipated.  I provided 15 minutes of non-face-to-face time during this encounter.      Review of Systems     Objective:   Physical Exam        Assessment & Plan:  DOE and chest discomfort At times he gets out of breath with moderate activities such as walking other times he does well Then there are other times where he gets short of breath with just mild activity With his underlying heart disease this is concerning He just recently had his beta-blocker increased He will monitor this both heart rate and blood pressure If his blood pressure stays in the 130s over 70s over the next few days he will increase amlodipine to 5 mg  He does have nitroglycerin just in case and also takes 81 mg aspirin  We will communicate with his cardiologist- more than likely the next step in this process would be consideration of catheterization  Certainly if catheterization is negative then next step would be consideration of up-to-date pulmonary function testing  Also patient does have mild sinus infection we will treat this with antibiotics I do not feel that is the reason he is having his chest discomfort or DOE

## 2018-08-12 NOTE — Progress Notes (Signed)
Electrophysiology TeleHealth Note   Due to national recommendations of social distancing due to COVID 19, an audio/video telehealth visit is felt to be most appropriate for this patient at this time.  See MyChart message from today for the patient's consent to telehealth for [facility].   Date:  08/12/2018   ID:  [name], DOB [birth year], MRN [id number]  Location: patient's home  Provider location: [address], [place]  Evaluation Performed: Follow-up visit  PCP:  Kathyrn Drown, MD  Cardiologist:  Kate Sable, MD  Electrophysiologist:  Dr Rayann Heman  Chief Complaint:  CAD  History of Present Illness:    [name] is a 63 y.o. male who presents via audio/video conferencing for a telehealth visit today.  Since last being seen in our clinic, the patient reports doing reasonably well.  He has occasional CP.  He also has noticed an irregular pulse with SOB.  Today, he denies symptoms of  lower extremity edema, dizziness, presyncope, or syncope.  The patient is otherwise without complaint today.  The patient denies symptoms of fevers, chills, cough, or new SOB worrisome for COVID 19.  Past Medical History:  Diagnosis Date  . ADD (attention deficit disorder)    Rx-Adderall  . Anginal pain ([place])   . AV block, 3rd degree (HCC)   . Colon polyps 2011   tubular adenoma by excisional biopsy during colonoscopy  . Complete heart block [facility])    a. s/p PPM Placement in 12/2012 with Medtronic CRT-P placement in 03/2014  . Coronary artery disease    a. s/p PCI of the RCA in 2014  . Degenerative disc disease, cervical   . Gastrointestinal bleeding 10/15/2017  . GERD (gastroesophageal reflux disease)   . Hypertension   . Pacemaker   . Palpitations 2003   Holter in 2003: PACs and PVCs; negative stress nuclear test in 2005; right bundle branch block; echo in 2008-mild LVH, otherwise normal; 2008-negative stress nuclear test.  . Pneumonia   . Prostatitis     prostate calcifications by CT  . Sleep apnea    questionable diagnosis    Past Surgical History:  Procedure Laterality Date  . ANTERIOR CERVICAL DECOMP/DISCECTOMY FUSION    . BACK SURGERY    . Biventricular pacemaker upgrade/ system revision  04/02/14   removal of previous atrial and ventricular leads with placement of a new MDT Consulta CRT-P system at [facility] by Dr Violet Baldy  . COLONOSCOPY    . COLONOSCOPY W/ POLYPECTOMY  2011   tubular adenoma  . ESOPHAGOGASTRODUODENOSCOPY     Gastritis  . INSERT / REPLACE / REMOVE PACEMAKER  12/17/2012   MDT Adapta L implanted by Dr Rayann Heman for mobitz II second degree AV block  . KNEE SURGERY Right   . LEFT HEART CATHETERIZATION WITH CORONARY ANGIOGRAM N/A 10/17/2012   Procedure: LEFT HEART CATHETERIZATION WITH CORONARY ANGIOGRAM;  Surgeon: Josue Hector, MD;  Location: [facility] CATH LAB;  Service: Cardiovascular;  Laterality: N/A;  . LEFT HEART CATHETERIZATION WITH CORONARY ANGIOGRAM N/A 12/17/2012   Procedure: LEFT HEART CATHETERIZATION WITH CORONARY ANGIOGRAM;  Surgeon: Peter M Martinique, MD;  Location: [facility] CATH LAB;  Service: Cardiovascular;  Laterality: N/A;  . LEFT HEART CATHETERIZATION WITH CORONARY ANGIOGRAM N/A 06/11/2013   Procedure: LEFT HEART CATHETERIZATION WITH CORONARY ANGIOGRAM;  Surgeon: Wellington Hampshire, MD;  Location: [facility] CATH LAB;  Service: Cardiovascular;  Laterality: N/A;  . PERCUTANEOUS CORONARY STENT INTERVENTION (PCI-S)  10/17/2012   Procedure: PERCUTANEOUS  CORONARY STENT INTERVENTION (PCI-S);  Surgeon: Josue Hector, MD;  Location: [facility] CATH LAB;  Service: Cardiovascular;;  . PERMANENT PACEMAKER INSERTION N/A 12/17/2012   Procedure: PERMANENT PACEMAKER INSERTION;  Surgeon: Thompson Grayer, MD;  Location: [facility] CATH LAB;  Service: Cardiovascular;  Laterality: N/A;  . POLYPECTOMY    . UPPER GASTROINTESTINAL ENDOSCOPY      Current Outpatient Medications  Medication Sig Dispense Refill  . aspirin 81 MG chewable tablet Chew 81 mg  by mouth daily.    . cyclobenzaprine (FLEXERIL) 5 MG tablet Take 5 mg by mouth at bedtime as needed for muscle spasms.    Marland Kitchen esomeprazole (NEXIUM) 40 MG capsule TAKE 1 CAPSULE ONCE DAILY 30 capsule 5  . hydrALAZINE (APRESOLINE) 50 MG tablet Take 1 tablet (50 mg total) by mouth 2 (two) times daily. 180 tablet 1  . losartan (COZAAR) 100 MG tablet TAKE 1 TABLET(100 MG) BY MOUTH DAILY 90 tablet 1  . nitroGLYCERIN (NITROSTAT) 0.4 MG SL tablet Place 1 tablet (0.4 mg total) under the tongue every 5 (five) minutes as needed for chest pain (MAX 3 TABLETS). 25 tablet 3  . oxyCODONE-acetaminophen (PERCOCET) 10-325 MG tablet Take 1 tablet by mouth every 4 (four) hours as needed for pain. 30 tablet 0  . promethazine (PHENERGAN) 25 MG tablet TAKE 1/2 TABLET BY MOUTH TWICE DAILY AS NEEDED FOR NAUSEA 30 tablet 2  . rosuvastatin (CRESTOR) 20 MG tablet TAKE 1 TABLET BY MOUTH DAILY 30 tablet 5  . tamsulosin (FLOMAX) 0.4 MG CAPS capsule TAKE 2 CAPSULES BY MOUTH ONCE DAILY 60 capsule 5  . amLODipine (NORVASC) 2.5 MG tablet Take 1 tablet (2.5 mg total) by mouth daily. 90 tablet 3  . nadolol (CORGARD) 80 MG tablet Take 1.5 tablets (120 mg total) by mouth daily. 135 tablet 3   No current facility-administered medications for this visit.     Allergies:   Morphine and related; Lasix [furosemide]; and Latex   Social History:  The patient  reports that he quit smoking about 14 years ago. His smoking use included cigarettes. He started smoking about 51 years ago. He has a 100.00 pack-year smoking history. He has quit using smokeless tobacco. He reports current alcohol use of about 1.0 standard drinks of alcohol per week. He reports previous drug use. Drug: Marijuana.   Family History:  The patient's  family history includes Breast cancer in his maternal aunt; Heart disease in his mother; Hypertension in his mother; Prostate cancer in his brother.   ROS:  Please see the history of present illness.   All other systems are  personally reviewed and negative.    Exam:    Vital Signs:  BP 136/71   Pulse 69   Wt 223 lb (101.2 kg)   SpO2 95%   BMI 31.10 kg/m   Well appearing, alert and conversant, regular work of breathing,  good skin color Eyes- anicteric, neuro- grossly intact, skin- no apparent rash or lesions or cyanosis, mouth- oral mucosa is pink   Labs/Other Tests and Data Reviewed:    Recent Labs: 10/15/2017: ALT 23 12/05/2017: BNP 103.7; Hemoglobin 14.8; Platelets 199; TSH 2.920 05/08/2018: BUN 13; Creatinine, Ser 1.08; Potassium 4.4; Sodium 143   Wt Readings from Last 3 Encounters:  08/12/18 223 lb (101.2 kg)  05/23/18 229 lb (103.9 kg)  04/25/18 229 lb 0.4 oz (103.9 kg)     Other studies personally reviewed: Additional studies/ records that were reviewed today include: [place]' notes  Review of the above records  today demonstrates: as above Prior radiographs: myoview 01/14/18- low risk study, EF 53%, mild apical ischemia  Echo 12/18/17- EF 55%, no WMA, no significant valvular issues   Last device remote is reviewed from [place] PDF dated 05/2018 which reveals normal device function, NSVT   ASSESSMENT & PLAN:    1.   Palpitations Histogram is normal PACs noted on device NSVT also noted Increase nadolol 120mg  daily today.  I have offered toprol XL 100mg  daily instead of nadolol which he declines Echo and myoview are normal  2. CAD myoview is reviewed Increase nadolol Follow-up with Dr Bronson Ing in 2 months May need to consider cath.  He does not wish to come to the hospital at this time  3. mobitz II second degree AV block Normal pacemaker remotes (reviewed today)  4. HTN Recently elevated Nadolol increased as above  5. COVID 19 screen The patient denies symptoms of COVID 19 at this time.  The importance of social distancing was discussed today.  Follow-up:  Dr Bronson Ing in 2 months,  Me in a year Next remote: 08/2018  Current medicines are reviewed at length  with the patient today.   The patient does not have concerns regarding his medicines.  The following changes were made today:  none  Labs/ tests ordered today include:  No orders of the defined types were placed in this encounter.   Patient Risk:  after full review of this patients clinical status, I feel that they are at moderate to high risk at this time.  Today, I have spent 20 minutes with the patient with telehealth technology discussing chest pain and palpitations .    Army Fossa, MD  08/12/2018 10:31 AM     [facility] [address] [place] [address] [phone] (office) [phone] (fax)

## 2018-08-14 ENCOUNTER — Telehealth: Payer: Self-pay | Admitting: Family Medicine

## 2018-08-14 NOTE — Telephone Encounter (Signed)
Patient advised per Dr Nicki Reaper: Please Dr Nicki Reaper did speak with Dr.Konerswaren his cardiologist. They will be reaching out to him to set up a virtual visit regarding his shortness of breath with activity. Patient verbalized understanding.

## 2018-08-14 NOTE — Telephone Encounter (Signed)
Nurses Please let the patient know that I did speak with Dr.Konerswaren his cardiologist. They will be reaching out to him to set up a virtual visit regarding his shortness of breath with activity.  (Also nurses on a previous message I asked to speak with cardiology I do not need to speak with them currently thank you since this has been handled)

## 2018-08-14 NOTE — Telephone Encounter (Signed)
I did discuss with the patient how I communicated with his cardiologist.Dr.Konerswaren is interested in seeing if the increase in his beta-blocker will help then they will do a follow-up in several weeks  I did tell the patient that if his symptomatology gets worse to notify us  Also if cardiac work-up is negative and his breathing issues persist pulmonary work-up would be next

## 2018-08-19 ENCOUNTER — Other Ambulatory Visit: Payer: Self-pay | Admitting: Cardiovascular Disease

## 2018-08-19 ENCOUNTER — Telehealth (INDEPENDENT_AMBULATORY_CARE_PROVIDER_SITE_OTHER): Payer: BLUE CROSS/BLUE SHIELD | Admitting: Physician Assistant

## 2018-08-19 ENCOUNTER — Telehealth: Payer: Self-pay

## 2018-08-19 ENCOUNTER — Encounter: Payer: Self-pay | Admitting: Family Medicine

## 2018-08-19 ENCOUNTER — Other Ambulatory Visit: Payer: Self-pay | Admitting: Family Medicine

## 2018-08-19 ENCOUNTER — Telehealth: Payer: Self-pay | Admitting: Family Medicine

## 2018-08-19 VITALS — BP 144/73 | HR 61 | Ht 72.0 in | Wt 223.0 lb

## 2018-08-19 DIAGNOSIS — I208 Other forms of angina pectoris: Secondary | ICD-10-CM | POA: Diagnosis not present

## 2018-08-19 DIAGNOSIS — I2089 Other forms of angina pectoris: Secondary | ICD-10-CM

## 2018-08-19 DIAGNOSIS — R0609 Other forms of dyspnea: Secondary | ICD-10-CM

## 2018-08-19 DIAGNOSIS — R0600 Dyspnea, unspecified: Secondary | ICD-10-CM

## 2018-08-19 DIAGNOSIS — E782 Mixed hyperlipidemia: Secondary | ICD-10-CM

## 2018-08-19 DIAGNOSIS — I25118 Atherosclerotic heart disease of native coronary artery with other forms of angina pectoris: Secondary | ICD-10-CM | POA: Diagnosis not present

## 2018-08-19 DIAGNOSIS — Z01818 Encounter for other preprocedural examination: Secondary | ICD-10-CM

## 2018-08-19 DIAGNOSIS — Z950 Presence of cardiac pacemaker: Secondary | ICD-10-CM | POA: Diagnosis not present

## 2018-08-19 DIAGNOSIS — I10 Essential (primary) hypertension: Secondary | ICD-10-CM | POA: Diagnosis not present

## 2018-08-19 MED ORDER — ALPRAZOLAM 0.5 MG PO TABS: 0.5000 mg | ORAL_TABLET | Freq: Two times a day (BID) | ORAL | 0 refills | Status: DC | PRN

## 2018-08-19 MED ORDER — ISOSORBIDE MONONITRATE ER 30 MG PO TB24: 30.0000 mg | ORAL_TABLET | Freq: Every day | ORAL | 3 refills | Status: DC

## 2018-08-19 NOTE — Telephone Encounter (Signed)
Great thank you for your help

## 2018-08-19 NOTE — Telephone Encounter (Signed)
Patient wanting an update on referral to heart doctor stating still having the same issues that you spoke on at his virtual visit on 4/27. Please Advise His number is [phone]

## 2018-08-19 NOTE — Telephone Encounter (Signed)
Dr Nicki Reaper states pt did see cardiology today and dr scott spoke with pt today

## 2018-08-19 NOTE — Progress Notes (Signed)
Virtual Visit via Video Note   This visit type was conducted due to national recommendations for restrictions regarding the COVID-19 Pandemic (e.g. social distancing) in an effort to limit this patient's exposure and mitigate transmission in our community.  Due to his co-morbid illnesses, this patient is at least at moderate risk for complications without adequate follow up.  This format is felt to be most appropriate for this patient at this time.  All issues noted in this document were discussed and addressed.  A limited physical exam was performed with this format.  Please refer to the patient's chart for his consent to telehealth for [facility].   Date:  08/19/2018   ID:  [name], DOB 1955/08/15, MRN [id number]  Patient Location: Home Provider Location: Home  PCP:  Kathyrn Drown, MD  Cardiologist:  Kate Sable, MD  Electrophysiologist:  None   Evaluation Performed:  Follow-Up Visit  Chief Complaint:  Chest pain and shortness of breath  History of Present Illness:    [name] is a 63 y.o. male with CAD status post PCI of the RCA in 2014, CHB status post permanent pacemaker in 2014, Medtronic CRT-P placement 2015, hypertension and HLD.  Patient was last seen by Bernerd Pho, PA-C 03/12/2018 time he was having increase in exertional chest pain and dyspnea on exertion.  Lexiscan Myoview showed mild apical ischemia with preserved EF overall low risk similar to prior imaging in 2018 and echo showed preserved LV function with no wall motion abnormality.  Imdur 15 mg daily was added.  He was decreased to 2.5 mg daily and he was told he could stop it if his blood pressure remained controlled.  He was maintained on hydralazine losartan and nadolol.  Patient saw Dr. Rayann Heman via [place] 08/12/18 and was having occasional CP and irregular pulse with shortness of breath.  Histogram was normal.  PACs and NSVT were noted on device.  He offered to change him from nadolol  to Toprol-XL 100 mg daily but patient declined blood pressure was up and nadolol was increased.  He recommended follow-up with Dr. Bronson Ing and consider cath but patient was reluctant to come to the hospital.  Patient complains of chest tightness and shortness of breath with little exertion. Patient under a lot of stress from PTSD and thinks that it may be contributing. Can walk 2 miles some days and does get tightness worse going up hill or lifting fertilizer. Also having chest tightness at rest while watching TV. Gets a carbonated drink to try to belch but it doesn't help. NTG has helped but never takes more than 1. Sometimes BP 190/90 but 144/80. Patient was treated for bronchitis in mid  March but no symptoms now.  The patient does not have symptoms concerning for COVID-19 infection (fever, chills, cough, or new shortness of breath).    Past Medical History:  Diagnosis Date  . ADD (attention deficit disorder)    Rx-Adderall  . Anginal pain ([employer])   . AV block, 3rd degree (HCC)   . Colon polyps 2011   tubular adenoma by excisional biopsy during colonoscopy  . Complete heart block [facility])    a. s/p PPM Placement in 12/2012 with Medtronic CRT-P placement in 03/2014  . Coronary artery disease    a. s/p PCI of the RCA in 2014  . Degenerative disc disease, cervical   . Gastrointestinal bleeding 10/15/2017  . GERD (gastroesophageal reflux disease)   . Hypertension   . Pacemaker   .  Palpitations 2003   Holter in 2003: PACs and PVCs; negative stress nuclear test in 2005; right bundle branch block; echo in 2008-mild LVH, otherwise normal; 2008-negative stress nuclear test.  . Pneumonia   . Prostatitis    prostate calcifications by CT  . Sleep apnea    questionable diagnosis   Past Surgical History:  Procedure Laterality Date  . ANTERIOR CERVICAL DECOMP/DISCECTOMY FUSION    . BACK SURGERY    . Biventricular pacemaker upgrade/ system revision  04/02/14   removal of previous atrial and  ventricular leads with placement of a new MDT Consulta CRT-P system at [facility] by Dr Violet Baldy  . COLONOSCOPY    . COLONOSCOPY W/ POLYPECTOMY  2011   tubular adenoma  . ESOPHAGOGASTRODUODENOSCOPY     Gastritis  . INSERT / REPLACE / REMOVE PACEMAKER  12/17/2012   MDT Adapta L implanted by Dr Rayann Heman for mobitz II second degree AV block  . KNEE SURGERY Right   . LEFT HEART CATHETERIZATION WITH CORONARY ANGIOGRAM N/A 10/17/2012   Procedure: LEFT HEART CATHETERIZATION WITH CORONARY ANGIOGRAM;  Surgeon: Josue Hector, MD;  Location[facility] C[facility] LAB;  Service: Cardiovascular;  Laterality: N/A;  . LEFT HEART CATHETERIZATION WITH CORONARY ANGIOGRAM N/A 12/17/2012   Procedure: LEFT HEART CATHETERIZATION WITH CORONARY ANGIOGRAM;  Surgeon: Peter M Martinique, MD;  Location: [facility] CATH LAB;  Service: Cardiovascular;  Laterality: N/A;  . LEFT HEART CATHETERIZATION WITH CORONARY ANGIOGRAM N/A 06/11/2013   Procedure: LEFT HEART CATHETERIZATION WITH CORONARY ANGIOGRAM;  Surgeon: Wellington Hampshire, MD;  Location: [place] CATH LAB;  Service: Cardiovascular;  Laterality: N/A;  . PERCUTANEOUS CORONARY STENT INTERVENTION (PCI-S)  10/17/2012   Procedure: PERCUTANEOUS CORONARY STENT INTERVENTION (PCI-S);  Surgeon: Josue Hector, MD;  Location: [facility] CATH LAB;  Service: Cardiovascular;;  . PERMANENT PACEMAKER INSERTION N/A 12/17/2012   Procedure: PERMANENT PACEMAKER INSERTION;  Surgeon: Thompson Grayer, MD;  Location: [facility] CATH LAB;  Service: Cardiovascular;  Laterality: N/A;  . POLYPECTOMY    . UPPER GASTROINTESTINAL ENDOSCOPY       Current Meds  Medication Sig  . amLODipine (NORVASC) 2.5 MG tablet Take 1 tablet (2.5 mg total) by mouth daily.  Marland Kitchen amoxicillin-clavulanate (AUGMENTIN) 875-125 MG tablet Take 1 tablet by mouth 2 (two) times daily.  Marland Kitchen aspirin 81 MG chewable tablet Chew 81 mg by mouth daily.  . cyclobenzaprine (FLEXERIL) 5 MG tablet Take 5 mg by mouth at bedtime as needed for muscle spasms.  Marland Kitchen esomeprazole (NEXIUM) 40  MG capsule TAKE 1 CAPSULE ONCE DAILY  . hydrALAZINE (APRESOLINE) 50 MG tablet TAKE 1 TABLET(50 MG) BY MOUTH TWICE DAILY  . losartan (COZAAR) 100 MG tablet TAKE 1 TABLET(100 MG) BY MOUTH DAILY  . nadolol (CORGARD) 80 MG tablet Take 1.5 tablets (120 mg total) by mouth daily.  . nitroGLYCERIN (NITROSTAT) 0.4 MG SL tablet Place 1 tablet (0.4 mg total) under the tongue every 5 (five) minutes as needed for chest pain (MAX 3 TABLETS).  Marland Kitchen oxyCODONE-acetaminophen (PERCOCET) 10-325 MG tablet Take 1 tablet by mouth every 4 (four) hours as needed for pain.  . promethazine (PHENERGAN) 25 MG tablet TAKE 1/2 TABLET BY MOUTH TWICE DAILY AS NEEDED FOR NAUSEA  . rosuvastatin (CRESTOR) 20 MG tablet TAKE 1 TABLET BY MOUTH DAILY  . tamsulosin (FLOMAX) 0.4 MG CAPS capsule TAKE 2 CAPSULES BY MOUTH ONCE DAILY     Allergies:   Morphine and related; Lasix [furosemide]; and Latex   Social History   Tobacco Use  . Smoking status: Former  Smoker    Packs/day: 2.50    Years: 40.00    Pack years: 100.00    Types: Cigarettes    Start date: 04/18/1967    Last attempt to quit: 12/20/2003    Years since quitting: 14.6  . Smokeless tobacco: Former Network engineer Use Topics  . Alcohol use: Yes    Alcohol/week: 1.0 standard drinks    Types: 1 Cans of beer per week  . Drug use: Not Currently    Types: Marijuana    Comment: 30 + years ago - "crank, marjiuanna, ect."     Family Hx: The patient's family history includes Breast cancer in his maternal aunt; Heart disease in his mother; Hypertension in his mother; Prostate cancer in his brother. There is no history of Colon cancer or Colon polyps.  ROS:   Please see the history of present illness.    Review of Systems  Constitution: Negative.  HENT: Negative.   Cardiovascular: Negative.   Respiratory: Negative.   Endocrine: Negative.   Hematologic/Lymphatic: Negative.   Musculoskeletal: Negative.   Gastrointestinal: Negative.   Genitourinary: Negative.    Neurological: Negative.     All other systems reviewed and are negative.   Prior CV studies:   The following studies were reviewed today:    Echocardiogram: 12/2017 Study Conclusions   - Left ventricle: The cavity size was normal. Wall thickness was   increased in a pattern of mild LVH. Systolic function was normal.   The estimated ejection fraction was in the range of 55% to 60%.   Wall motion was normal; there were no regional wall motion   abnormalities. - Aortic valve: Mildly to moderately calcified annulus. Trileaflet.   There was trivial regurgitation. Mean gradient (S): 7 mm Hg. Peak   gradient (S): 14 mm Hg. VTI ratio of LVOT to aortic valve: 0.57.   Valve area (VTI): 2.15 cm^2. - Mitral valve: There was trivial regurgitation. - Left atrium: The atrium was mildly dilated. - Right ventricle: Pacer wire or catheter noted in right ventricle. - Right atrium: Pacer wire or catheter noted in right atrium. - Tricuspid valve: There was trivial regurgitation. - Pulmonary arteries: Systolic pressure could not be accurately   estimated. - Pericardium, extracardiac: There was no pericardial effusion.   NST: 12/2017  Findings consistent with mild apical ischemia.  The left ventricular ejection fraction is low normal (53%).  This is a low risk study.  Patient ventricular paced throughout study.     Labs/Other Tests and Data Reviewed:    EKG:  An ECG dated 12/07/17 was personally reviewed today and demonstrated:  Paced rhythm  Recent Labs: 10/15/2017: ALT 23 12/05/2017: BNP 103.7; Hemoglobin 14.8; Platelets 199; TSH 2.920 05/08/2018: BUN 13; Creatinine, Ser 1.08; Potassium 4.4; Sodium 143   Recent Lipid Panel Lab Results  Component Value Date/Time   CHOL 119 05/08/2018 10:02 AM   TRIG 64 05/08/2018 10:02 AM   HDL 52 05/08/2018 10:02 AM   CHOLHDL 2.3 05/08/2018 10:02 AM   CHOLHDL 2.5 12/17/2012 02:15 AM   LDLCALC 54 05/08/2018 10:02 AM    Wt Readings from Last 3  Encounters:  08/19/18 223 lb (101.2 kg)  08/12/18 223 lb (101.2 kg)  05/23/18 229 lb (103.9 kg)     Objective:    Vital Signs:  BP (!) 144/73   Pulse 61   Ht 6' (1.829 m)   Wt 223 lb (101.2 kg)   BMI 30.24 kg/m    VITAL SIGNS:  reviewed GEN:  no acute distress RESPIRATORY:  normal respiratory effort, symmetric expansion CARDIOVASCULAR:  lower extremity edema noted  ASSESSMENT & PLAN:    Chest pain-continues to have daily chest tightness with little activity but also with stress-some relief from 1 NTG but hasn't taken more than that. Discussed with Dr. Harrington Challenger and patient and his wife in detail. Will schedule for a cardiac cath later this week after appropriate COVID19 testing. He's advised to go to ER for prolonged chest pain greater than 15 min unrelieved with 3 SL NTG. Patient agreeable to proceed and voices relief in being able to have cath.I have reviewed the risks, indications, and alternatives to angioplasty and stenting with the patient. Risks include but are not limited to bleeding, infection, vascular injury, stroke, myocardial infection, arrhythmia, kidney injury, radiation-related injury in the case of prolonged fluoroscopy use, emergency cardiac surgery, and death. The patient understands the risks of serious complication is low (<2%) and patient agrees to proceed.    CAD status post PCI/stent of the RCA in 2014(30 & 40%LAD at that time), low risk NST 12/2017 but ongoing chest pain and dyspnea on exertion-see above  CHB status post PPM in 2014, replaced with Medtronic CRT-P 2015 followed closely by Dr. Rayann Heman and just seen 08/12/2018.  PACs and NSVT noted.  Offered to switch to Toprol but instead patient preferred to increase nadolol  Essential hypertension nadolol recently increased for this-runs high at times. Will add Imdur 30 mg daily  Hyperlipidemia on Crestor-followed by PCP  Dyspnea on exertion evaluated by Dr. Lake Bells in 2018 with no evidence of COPD or interstitial  lung disease.  Shortness of breath appeared to be due to left hemidiaphragm paralysis.  COVID-19 Education: The signs and symptoms of COVID-19 were discussed with the patient and how to seek care for testing (follow up with PCP or arrange E-visit).  The importance of social distancing was discussed today.  Time:   Today, I have spent 25 minutes with the patient with telehealth technology discussing the above problems.     Medication Adjustments/Labs and Tests Ordered: Current medicines are reviewed at length with the patient today.  Concerns regarding medicines are outlined above.   Tests Ordered: Orders Placed This Encounter  Procedures  . Novel Coronavirus, NAA ([facility])  . CBC with Differential/Platelet  . Basic Metabolic Panel (BMET)    Medication Changes: Meds ordered this encounter  Medications  . isosorbide mononitrate (IMDUR) 30 MG 24 hr tablet    Sig: Take 1 tablet (30 mg total) by mouth daily.    Dispense:  90 tablet    Refill:  3    Disposition:  Follow up in 2 week(s) with me post cath  Signed, Ermalinda Barrios, PA-C  08/19/2018 2:50 PM    [facility]

## 2018-08-19 NOTE — Telephone Encounter (Signed)
Patient has had SOB and intermittent CP for past 2 weeks.Dr.Koneswaran asks he have a doximity visit to ferret this out   Apt made for 1 pm today with M.Lenze PA-C

## 2018-08-19 NOTE — Telephone Encounter (Signed)
See my chart message- cardiology triaged patient and sent him to ER today

## 2018-08-19 NOTE — Patient Instructions (Addendum)
Medication Instructions:  Your physician recommends that you continue on your current medications as directed. Please refer to the Current Medication list given to you today.   Start Imdur 30 mg Daily   Labwork: Your physician recommends that you return for lab work in: Today   Testing/Procedures: Your physician has requested that you have a cardiac catheterization. Cardiac catheterization is used to diagnose and/or treat various heart conditions. Doctors may recommend this procedure for a number of different reasons. The most common reason is to evaluate chest pain. Chest pain can be a symptom of coronary artery disease (CAD), and cardiac catheterization can show whether plaque is narrowing or blocking your heart's arteries. This procedure is also used to evaluate the valves, as well as measure the blood flow and oxygen levels in different parts of your heart. For further information please visit [email or web address]. Please follow instruction sheet, as given.    Follow-Up: Your physician recommends that you schedule a follow-up appointment After cath.    Any Other Special Instructions Will Be Listed Below (If Applicable).     If you need a refill on your cardiac medications before your next appointment, please call your pharmacy.  Thank you for choosing [facility]!     [address] [place] [address] Dept: [phone] Loc: [phone]  [name]  08/19/2018  You are scheduled for a Catherazition  on Thursday, Aug 22, 2018  with Dr.Kelly.  1. Please arrive at the [facility] (Main Entrance A) at [facility] [address] [place], [place] [address] at [place] (This time is two hours before your procedure to ensure your preparation). Free valet parking service is available.   Special note: Every effort is made to have your procedure done on time. Please understand  that emergencies sometimes delay scheduled procedures.  2. Diet: Do not eat solid foods after midnight.  The patient may have clear liquids until 5am upon the day of the procedure.  3. Labs: You will need to have blood drawn on Today . You do not need to be fasting.  4. Medication instructions in preparation for your procedure:   Contrast Allergy: No   On the morning of your procedure, take your Aspirin and any morning medicines NOT listed above.  You may use sips of water.  5. Plan for one night stay--bring personal belongings. 6. Bring a current list of your medications and current insurance cards. 7. You MUST have a responsible person to drive you home. 8. Someone MUST be with you the first 24 hours after you arrive home or your discharge will be delayed. 9. Please wear clothes that are easy to get on and off and wear slip-on shoes.  Thank you for allowing [place] to care for you!   -- [facility]

## 2018-08-19 NOTE — Addendum Note (Signed)
Addended by: Levonne Hubert on: 08/19/2018 04:33 PM   Modules accepted: Orders

## 2018-08-19 NOTE — H&P (View-Only) (Signed)
Virtual Visit via Video Note   This visit type was conducted due to national recommendations for restrictions regarding the COVID-19 Pandemic (e.g. social distancing) in an effort to limit this patient's exposure and mitigate transmission in our community.  Due to his co-morbid illnesses, this patient is at least at moderate risk for complications without adequate follow up.  This format is felt to be most appropriate for this patient at this time.  All issues noted in this document were discussed and addressed.  A limited physical exam was performed with this format.  Please refer to the patient's chart for his consent to telehealth for [facility].   Date:  08/19/2018   ID:  [name], DOB Aug 20, 1955, MRN [id number]  Patient Location: Home Provider Location: Home  PCP:  Kathyrn Drown, MD  Cardiologist:  Kate Sable, MD  Electrophysiologist:  None   Evaluation Performed:  Follow-Up Visit  Chief Complaint:  Chest pain and shortness of breath  History of Present Illness:    [name] is a 63 y.o. male with CAD status post PCI of the RCA in 2014, CHB status post permanent pacemaker in 2014, Medtronic CRT-P placement 2015, hypertension and HLD.  Patient was last seen by Bernerd Pho, PA-C 03/12/2018 time he was having increase in exertional chest pain and dyspnea on exertion.  Lexiscan Myoview showed mild apical ischemia with preserved EF overall low risk similar to prior imaging in 2018 and echo showed preserved LV function with no wall motion abnormality.  Imdur 15 mg daily was added.  He was decreased to 2.5 mg daily and he was told he could stop it if his blood pressure remained controlled.  He was maintained on hydralazine losartan and nadolol.  Patient saw Dr. Rayann Heman via [place] 08/12/18 and was having occasional CP and irregular pulse with shortness of breath.  Histogram was normal.  PACs and NSVT were noted on device.  He offered to change him from nadolol  to Toprol-XL 100 mg daily but patient declined blood pressure was up and nadolol was increased.  He recommended follow-up with Dr. Bronson Ing and consider cath but patient was reluctant to come to the hospital.  Patient complains of chest tightness and shortness of breath with little exertion. Patient under a lot of stress from PTSD and thinks that it may be contributing. Can walk 2 miles some days and does get tightness worse going up hill or lifting fertilizer. Also having chest tightness at rest while watching TV. Gets a carbonated drink to try to belch but it doesn't help. NTG has helped but never takes more than 1. Sometimes BP 190/90 but 144/80. Patient was treated for bronchitis in mid  March but no symptoms now.  The patient does not have symptoms concerning for COVID-19 infection (fever, chills, cough, or new shortness of breath).    Past Medical History:  Diagnosis Date  . ADD (attention deficit disorder)    Rx-Adderall  . Anginal pain ([place])   . AV block, 3rd degree (HCC)   . Colon polyps 2011   tubular adenoma by excisional biopsy during colonoscopy  . Complete heart block [facility])    a. s/p PPM Placement in 12/2012 with Medtronic CRT-P placement in 03/2014  . Coronary artery disease    a. s/p PCI of the RCA in 2014  . Degenerative disc disease, cervical   . Gastrointestinal bleeding 10/15/2017  . GERD (gastroesophageal reflux disease)   . Hypertension   . Pacemaker   .  Palpitations 2003   Holter in 2003: PACs and PVCs; negative stress nuclear test in 2005; right bundle branch block; echo in 2008-mild LVH, otherwise normal; 2008-negative stress nuclear test.  . Pneumonia   . Prostatitis    prostate calcifications by CT  . Sleep apnea    questionable diagnosis   Past Surgical History:  Procedure Laterality Date  . ANTERIOR CERVICAL DECOMP/DISCECTOMY FUSION    . BACK SURGERY    . Biventricular pacemaker upgrade/ system revision  04/02/14   removal of previous atrial and  ventricular leads with placement of a new MDT Consulta CRT-P system at [facility] by Dr Violet Baldy  . COLONOSCOPY    . COLONOSCOPY W/ POLYPECTOMY  2011   tubular adenoma  . ESOPHAGOGASTRODUODENOSCOPY     Gastritis  . INSERT / REPLACE / REMOVE PACEMAKER  12/17/2012   MDT Adapta L implanted by Dr Rayann Heman for mobitz II second degree AV block  . KNEE SURGERY Right   . LEFT HEART CATHETERIZATION WITH CORONARY ANGIOGRAM N/A 10/17/2012   Procedure: LEFT HEART CATHETERIZATION WITH CORONARY ANGIOGRAM;  Surgeon: Josue Hector, MD;  Location: [facility] CATH LAB;  Service: Cardiovascular;  Laterality: N/A;  . LEFT HEART CATHETERIZATION WITH CORONARY ANGIOGRAM N/A 12/17/2012   Procedure: LEFT HEART CATHETERIZATION WITH CORONARY ANGIOGRAM;  Surgeon: Peter M Martinique, MD;  Location: [facility] CATH LAB;  Service: Cardiovascular;  Laterality: N/A;  . LEFT HEART CATHETERIZATION WITH CORONARY ANGIOGRAM N/A 06/11/2013   Procedure: LEFT HEART CATHETERIZATION WITH CORONARY ANGIOGRAM;  Surgeon: Wellington Hampshire, MD;  Location: [facility] CATH LAB;  Service: Cardiovascular;  Laterality: N/A;  . PERCUTANEOUS CORONARY STENT INTERVENTION (PCI-S)  10/17/2012   Procedure: PERCUTANEOUS CORONARY STENT INTERVENTION (PCI-S);  Surgeon: Josue Hector, MD;  Location: [facility] CATH LAB;  Service: Cardiovascular;;  . PERMANENT PACEMAKER INSERTION N/A 12/17/2012   Procedure: PERMANENT PACEMAKER INSERTION;  Surgeon: Thompson Grayer, MD;  Location: [facility] CATH LAB;  Service: Cardiovascular;  Laterality: N/A;  . POLYPECTOMY    . UPPER GASTROINTESTINAL ENDOSCOPY       Current Meds  Medication Sig  . amLODipine (NORVASC) 2.5 MG tablet Take 1 tablet (2.5 mg total) by mouth daily.  Marland Kitchen amoxicillin-clavulanate (AUGMENTIN) 875-125 MG tablet Take 1 tablet by mouth 2 (two) times daily.  Marland Kitchen aspirin 81 MG chewable tablet Chew 81 mg by mouth daily.  . cyclobenzaprine (FLEXERIL) 5 MG tablet Take 5 mg by mouth at bedtime as needed for muscle spasms.  Marland Kitchen esomeprazole (NEXIUM) 40  MG capsule TAKE 1 CAPSULE ONCE DAILY  . hydrALAZINE (APRESOLINE) 50 MG tablet TAKE 1 TABLET(50 MG) BY MOUTH TWICE DAILY  . losartan (COZAAR) 100 MG tablet TAKE 1 TABLET(100 MG) BY MOUTH DAILY  . nadolol (CORGARD) 80 MG tablet Take 1.5 tablets (120 mg total) by mouth daily.  . nitroGLYCERIN (NITROSTAT) 0.4 MG SL tablet Place 1 tablet (0.4 mg total) under the tongue every 5 (five) minutes as needed for chest pain (MAX 3 TABLETS).  Marland Kitchen oxyCODONE-acetaminophen (PERCOCET) 10-325 MG tablet Take 1 tablet by mouth every 4 (four) hours as needed for pain.  . promethazine (PHENERGAN) 25 MG tablet TAKE 1/2 TABLET BY MOUTH TWICE DAILY AS NEEDED FOR NAUSEA  . rosuvastatin (CRESTOR) 20 MG tablet TAKE 1 TABLET BY MOUTH DAILY  . tamsulosin (FLOMAX) 0.4 MG CAPS capsule TAKE 2 CAPSULES BY MOUTH ONCE DAILY     Allergies:   Morphine and related; Lasix [furosemide]; and Latex   Social History   Tobacco Use  . Smoking status: Former  Smoker    Packs/day: 2.50    Years: 40.00    Pack years: 100.00    Types: Cigarettes    Start date: 04/18/1967    Last attempt to quit: 12/20/2003    Years since quitting: 14.6  . Smokeless tobacco: Former Network engineer Use Topics  . Alcohol use: Yes    Alcohol/week: 1.0 standard drinks    Types: 1 Cans of beer per week  . Drug use: Not Currently    Types: Marijuana    Comment: 30 + years ago - "crank, marjiuanna, ect."     Family Hx: The patient's family history includes Breast cancer in his maternal aunt; Heart disease in his mother; Hypertension in his mother; Prostate cancer in his brother. There is no history of Colon cancer or Colon polyps.  ROS:   Please see the history of present illness.    Review of Systems  Constitution: Negative.  HENT: Negative.   Cardiovascular: Negative.   Respiratory: Negative.   Endocrine: Negative.   Hematologic/Lymphatic: Negative.   Musculoskeletal: Negative.   Gastrointestinal: Negative.   Genitourinary: Negative.    Neurological: Negative.     All other systems reviewed and are negative.   Prior CV studies:   The following studies were reviewed today:    Echocardiogram: 12/2017 Study Conclusions   - Left ventricle: The cavity size was normal. Wall thickness was   increased in a pattern of mild LVH. Systolic function was normal.   The estimated ejection fraction was in the range of 55% to 60%.   Wall motion was normal; there were no regional wall motion   abnormalities. - Aortic valve: Mildly to moderately calcified annulus. Trileaflet.   There was trivial regurgitation. Mean gradient (S): 7 mm Hg. Peak   gradient (S): 14 mm Hg. VTI ratio of LVOT to aortic valve: 0.57.   Valve area (VTI): 2.15 cm^2. - Mitral valve: There was trivial regurgitation. - Left atrium: The atrium was mildly dilated. - Right ventricle: Pacer wire or catheter noted in right ventricle. - Right atrium: Pacer wire or catheter noted in right atrium. - Tricuspid valve: There was trivial regurgitation. - Pulmonary arteries: Systolic pressure could not be accurately   estimated. - Pericardium, extracardiac: There was no pericardial effusion.   NST: 12/2017  Findings consistent with mild apical ischemia.  The left ventricular ejection fraction is low normal (53%).  This is a low risk study.  Patient ventricular paced throughout study.     Labs/Other Tests and Data Reviewed:    EKG:  An ECG dated 12/07/17 was personally reviewed today and demonstrated:  Paced rhythm  Recent Labs: 10/15/2017: ALT 23 12/05/2017: BNP 103.7; Hemoglobin 14.8; Platelets 199; TSH 2.920 05/08/2018: BUN 13; Creatinine, Ser 1.08; Potassium 4.4; Sodium 143   Recent Lipid Panel Lab Results  Component Value Date/Time   CHOL 119 05/08/2018 10:02 AM   TRIG 64 05/08/2018 10:02 AM   HDL 52 05/08/2018 10:02 AM   CHOLHDL 2.3 05/08/2018 10:02 AM   CHOLHDL 2.5 12/17/2012 02:15 AM   LDLCALC 54 05/08/2018 10:02 AM    Wt Readings from Last 3  Encounters:  08/19/18 223 lb (101.2 kg)  08/12/18 223 lb (101.2 kg)  05/23/18 229 lb (103.9 kg)     Objective:    Vital Signs:  BP (!) 144/73   Pulse 61   Ht 6' (1.829 m)   Wt 223 lb (101.2 kg)   BMI 30.24 kg/m    VITAL SIGNS:  reviewed GEN:  no acute distress RESPIRATORY:  normal respiratory effort, symmetric expansion CARDIOVASCULAR:  lower extremity edema noted  ASSESSMENT & PLAN:    Chest pain-continues to have daily chest tightness with little activity but also with stress-some relief from 1 NTG but hasn't taken more than that. Discussed with Dr. Harrington Challenger and patient and his wife in detail. Will schedule for a cardiac cath later this week after appropriate COVID19 testing. He's advised to go to ER for prolonged chest pain greater than 15 min unrelieved with 3 SL NTG. Patient agreeable to proceed and voices relief in being able to have cath.I have reviewed the risks, indications, and alternatives to angioplasty and stenting with the patient. Risks include but are not limited to bleeding, infection, vascular injury, stroke, myocardial infection, arrhythmia, kidney injury, radiation-related injury in the case of prolonged fluoroscopy use, emergency cardiac surgery, and death. The patient understands the risks of serious complication is low (<6%) and patient agrees to proceed.    CAD status post PCI/stent of the RCA in 2014(30 & 40%LAD at that time), low risk NST 12/2017 but ongoing chest pain and dyspnea on exertion-see above  CHB status post PPM in 2014, replaced with Medtronic CRT-P 2015 followed closely by Dr. Rayann Heman and just seen 08/12/2018.  PACs and NSVT noted.  Offered to switch to Toprol but instead patient preferred to increase nadolol  Essential hypertension nadolol recently increased for this-runs high at times. Will add Imdur 30 mg daily  Hyperlipidemia on Crestor-followed by PCP  Dyspnea on exertion evaluated by Dr. Lake Bells in 2018 with no evidence of COPD or interstitial  lung disease.  Shortness of breath appeared to be due to left hemidiaphragm paralysis.  COVID-19 Education: The signs and symptoms of COVID-19 were discussed with the patient and how to seek care for testing (follow up with PCP or arrange E-visit).  The importance of social distancing was discussed today.  Time:   Today, I have spent 25 minutes with the patient with telehealth technology discussing the above problems.     Medication Adjustments/Labs and Tests Ordered: Current medicines are reviewed at length with the patient today.  Concerns regarding medicines are outlined above.   Tests Ordered: Orders Placed This Encounter  Procedures  . Novel Coronavirus, NAA ([facility])  . CBC with Differential/Platelet  . Basic Metabolic Panel (BMET)    Medication Changes: Meds ordered this encounter  Medications  . isosorbide mononitrate (IMDUR) 30 MG 24 hr tablet    Sig: Take 1 tablet (30 mg total) by mouth daily.    Dispense:  90 tablet    Refill:  3    Disposition:  Follow up in 2 week(s) with me post cath  Signed, Ermalinda Barrios, PA-C  08/19/2018 2:50 PM    [facility]

## 2018-08-19 NOTE — Telephone Encounter (Signed)
Let patient know that I will reach out to his cardiologist  Based on our last conversations cardiology was interested in seeing if the beta-blocker or it would make any difference with his shortness of breath.  Based on his current message it does not seem like it is making any difference  Therefore we will connect with to his cardiologist to see if they can see him sooner even if virtual

## 2018-08-19 NOTE — Telephone Encounter (Signed)
Thank you for your note. I have set him up for a cath this Thursday and added Imdur to his meds. He seems relieved to be able to get a diagnosis. We will be in touch.  Thanks again Ermalinda Barrios

## 2018-08-19 NOTE — Telephone Encounter (Signed)
This patient has been experiencing some chest tightness and some shortness of breath intermittently over the past couple weeks worse over the past week I did communicate with his cardiologist previously they were planning on following him up in early June as a follow-up to his beta-blocker change Patient feels the symptoms are getting worse Patient does not want to go to the ER He has a virtual visit with cardiology today at 1:00 At the request of his wife we will send in the Xanax for patient to use in the evening if he gets stressed out about this  When he saw Dr Rayann Heman it was mentioned in the note that if he does not improve with the beta-blocker change strong consideration for catheterization Patient may well need to have chest x-ray and other work-up but will leave this to the cardiology consultation today  He has a virtual visit today we will send a copy of telephone message to them as well

## 2018-08-19 NOTE — Telephone Encounter (Signed)
Called dr Kristian Covey office to follow up on appt because next one scheduled was for 09/23/18. Was told he already called and spoke with nurse and was advised to go to ED for active chest pain.

## 2018-08-20 DIAGNOSIS — Z01818 Encounter for other preprocedural examination: Secondary | ICD-10-CM | POA: Diagnosis not present

## 2018-08-20 DIAGNOSIS — I25118 Atherosclerotic heart disease of native coronary artery with other forms of angina pectoris: Secondary | ICD-10-CM | POA: Diagnosis not present

## 2018-08-20 NOTE — Addendum Note (Signed)
Addended by: Levonne Hubert on: 08/20/2018 02:01 PM   Modules accepted: Orders

## 2018-08-21 ENCOUNTER — Telehealth: Payer: Self-pay | Admitting: *Deleted

## 2018-08-21 LAB — BASIC METABOLIC PANEL
BUN/Creatinine Ratio: 17 (ref 10–24)
BUN: 16 mg/dL (ref 8–27)
CO2: 26 mmol/L (ref 20–29)
Calcium: 9.5 mg/dL (ref 8.6–10.2)
Chloride: 104 mmol/L (ref 96–106)
Creatinine, Ser: 0.92 mg/dL (ref 0.76–1.27)
GFR calc Af Amer: 102 mL/min/{1.73_m2} (ref >59)
GFR calc non Af Amer: 88 mL/min/{1.73_m2} (ref >59)
Glucose: 89 mg/dL (ref 65–99)
Potassium: 4.3 mmol/L (ref 3.5–5.2)
Sodium: 143 mmol/L (ref 134–144)

## 2018-08-21 LAB — CBC WITH DIFFERENTIAL/PLATELET
Basophils Absolute: 0.1 10*3/uL (ref 0.0–0.2)
Basos: 1 %
EOS (ABSOLUTE): 0.2 10*3/uL (ref 0.0–0.4)
Eos: 2 %
Hematocrit: 42.7 % (ref 37.5–51.0)
Hemoglobin: 14.2 g/dL (ref 13.0–17.7)
Immature Grans (Abs): 0 10*3/uL (ref 0.0–0.1)
Immature Granulocytes: 0 %
Lymphocytes Absolute: 2.2 10*3/uL (ref 0.7–3.1)
Lymphs: 26 %
MCH: 29.5 pg (ref 26.6–33.0)
MCHC: 33.3 g/dL (ref 31.5–35.7)
MCV: 89 fL (ref 79–97)
Monocytes Absolute: 0.7 10*3/uL (ref 0.1–0.9)
Monocytes: 8 %
Neutrophils Absolute: 5.5 10*3/uL (ref 1.4–7.0)
Neutrophils: 63 %
Platelets: 219 10*3/uL (ref 150–450)
RBC: 4.82 x10E6/uL (ref 4.14–5.80)
RDW: 13.1 % (ref 11.6–15.4)
WBC: 8.7 10*3/uL (ref 3.4–10.8)

## 2018-08-21 NOTE — Telephone Encounter (Addendum)
Pt contacted pre-catheterization scheduled at [facility] for: Thursday May 7,2020 9 AM Verified arrival time and place: [place] Entrance A at: 7 AM  No solid food after midnight prior to cath, clear liquids until 5 AM day of procedure. Contrast allergy: no   AM meds can be  taken pre-cath with sip of water including: ASA 81 mg  Confirm patient has responsible person to drive home post procedure and observe 24 hours after arriving home: yes    Pt advised due to Covid-19 pandemic, [facility] is restricting visitors and only patients should present for check-in prior to their procedure. People will not be allowed to enter [facility] with the patient. At this time [facility] is not allowing visitors to  all [facility] campuses.  ____________    [id number] Pre-Screening Questions:  . Do you currently have a fever? no . Have you recently travelled on a cruise, internationally, or to [place], [place], [place], [place], [place], or [place], [place] [employer]) ? no . Have you been in contact with someone that is currently pending confirmation of Covid19 testing or has been confirmed to have the [place] virus?  no . Are you currently experiencing fatigue or cough? yes-fatigue . Are you currently experiencing  any new or worsening shortness of breath at rest or with minimal activity? yes . Have you been in contact with someone that was recently sick with fever/cough/fatigue? no   I reviewed procedure instructions, visitor restrictions and Covid-19 screening questions with patient 08/21/18.

## 2018-08-21 NOTE — Telephone Encounter (Signed)
New Message ° ° °Patient returning your call. °

## 2018-08-22 ENCOUNTER — Encounter (HOSPITAL_COMMUNITY)
Admission: RE | Disposition: A | Discharge status: Home / Self Care | Payer: BLUE CROSS/BLUE SHIELD | Source: Home / Self Care | Attending: Cardiovascular Disease

## 2018-08-22 ENCOUNTER — Other Ambulatory Visit: Payer: Self-pay

## 2018-08-22 ENCOUNTER — Ambulatory Visit (HOSPITAL_COMMUNITY)
Admission: RE | Admit: 2018-08-22 | Discharge: 2018-08-22 | Disposition: A | Discharge status: Home / Self Care | Payer: BLUE CROSS/BLUE SHIELD | Attending: Cardiovascular Disease | Admitting: Cardiovascular Disease

## 2018-08-22 DIAGNOSIS — K219 Gastro-esophageal reflux disease without esophagitis: Secondary | ICD-10-CM | POA: Diagnosis not present

## 2018-08-22 DIAGNOSIS — R0609 Other forms of dyspnea: Secondary | ICD-10-CM | POA: Diagnosis not present

## 2018-08-22 DIAGNOSIS — I4510 Unspecified right bundle-branch block: Secondary | ICD-10-CM | POA: Insufficient documentation

## 2018-08-22 DIAGNOSIS — I442 Atrioventricular block, complete: Secondary | ICD-10-CM | POA: Insufficient documentation

## 2018-08-22 DIAGNOSIS — I25119 Atherosclerotic heart disease of native coronary artery with unspecified angina pectoris: Secondary | ICD-10-CM

## 2018-08-22 DIAGNOSIS — G4730 Sleep apnea, unspecified: Secondary | ICD-10-CM | POA: Insufficient documentation

## 2018-08-22 DIAGNOSIS — Z955 Presence of coronary angioplasty implant and graft: Secondary | ICD-10-CM | POA: Diagnosis not present

## 2018-08-22 DIAGNOSIS — Z79899 Other long term (current) drug therapy: Secondary | ICD-10-CM | POA: Insufficient documentation

## 2018-08-22 DIAGNOSIS — I472 Ventricular tachycardia: Secondary | ICD-10-CM | POA: Insufficient documentation

## 2018-08-22 DIAGNOSIS — Z91040 Latex allergy status: Secondary | ICD-10-CM | POA: Insufficient documentation

## 2018-08-22 DIAGNOSIS — Z7982 Long term (current) use of aspirin: Secondary | ICD-10-CM | POA: Diagnosis not present

## 2018-08-22 DIAGNOSIS — E785 Hyperlipidemia, unspecified: Secondary | ICD-10-CM | POA: Insufficient documentation

## 2018-08-22 DIAGNOSIS — I10 Essential (primary) hypertension: Secondary | ICD-10-CM | POA: Diagnosis not present

## 2018-08-22 DIAGNOSIS — Z885 Allergy status to narcotic agent status: Secondary | ICD-10-CM | POA: Diagnosis not present

## 2018-08-22 DIAGNOSIS — Z950 Presence of cardiac pacemaker: Secondary | ICD-10-CM | POA: Insufficient documentation

## 2018-08-22 DIAGNOSIS — Z888 Allergy status to other drugs, medicaments and biological substances status: Secondary | ICD-10-CM | POA: Insufficient documentation

## 2018-08-22 DIAGNOSIS — I25118 Atherosclerotic heart disease of native coronary artery with other forms of angina pectoris: Secondary | ICD-10-CM

## 2018-08-22 DIAGNOSIS — I208 Other forms of angina pectoris: Secondary | ICD-10-CM

## 2018-08-22 DIAGNOSIS — Z87891 Personal history of nicotine dependence: Secondary | ICD-10-CM | POA: Insufficient documentation

## 2018-08-22 DIAGNOSIS — Z8249 Family history of ischemic heart disease and other diseases of the circulatory system: Secondary | ICD-10-CM | POA: Diagnosis not present

## 2018-08-22 DIAGNOSIS — I2089 Other forms of angina pectoris: Secondary | ICD-10-CM

## 2018-08-22 HISTORY — PX: CORONARY STENT INTERVENTION: CATH118234

## 2018-08-22 HISTORY — PX: LEFT HEART CATH AND CORONARY ANGIOGRAPHY: CATH118249

## 2018-08-22 LAB — POCT ACTIVATED CLOTTING TIME
Activated Clotting Time: 588 seconds
Activated Clotting Time: 175 seconds
Activated Clotting Time: 158 seconds

## 2018-08-22 SURGERY — LEFT HEART CATH AND CORONARY ANGIOGRAPHY
Anesthesia: LOCAL

## 2018-08-22 MED ORDER — SODIUM CHLORIDE 0.9 % IV SOLN
INTRAVENOUS | Status: DC | PRN
  Administered 2018-08-22: 1.75 mg/kg/h via INTRAVENOUS

## 2018-08-22 MED ORDER — LABETALOL HCL 5 MG/ML IV SOLN
10.0000 mg | INTRAVENOUS | Status: AC | PRN
  Administered 2018-08-22: 10 mg via INTRAVENOUS
  Filled 2018-08-22 (×2): qty 4

## 2018-08-22 MED ORDER — TICAGRELOR 90 MG PO TABS
ORAL_TABLET | ORAL | Status: DC | PRN
  Administered 2018-08-22: 180 mg via ORAL

## 2018-08-22 MED ORDER — SODIUM CHLORIDE 0.9 % WEIGHT BASED INFUSION
3.0000 mL/kg/h | INTRAVENOUS | Status: AC
  Administered 2018-08-22: 3 mL/kg/h via INTRAVENOUS

## 2018-08-22 MED ORDER — ASPIRIN 81 MG PO CHEW: 81.0000 mg | CHEWABLE_TABLET | Freq: Every day | ORAL | Status: DC

## 2018-08-22 MED ORDER — BIVALIRUDIN TRIFLUOROACETATE 250 MG IV SOLR
INTRAVENOUS | Status: AC
  Filled 2018-08-22: qty 250

## 2018-08-22 MED ORDER — MIDAZOLAM HCL 2 MG/2ML IJ SOLN
INTRAMUSCULAR | Status: DC | PRN
  Administered 2018-08-22: 2 mg via INTRAVENOUS
  Administered 2018-08-22 (×2): 1 mg via INTRAVENOUS

## 2018-08-22 MED ORDER — NITROGLYCERIN 1 MG/10 ML FOR IR/CATH LAB
INTRA_ARTERIAL | Status: AC
  Filled 2018-08-22: qty 10

## 2018-08-22 MED ORDER — NITROGLYCERIN 1 MG/10 ML FOR IR/CATH LAB
INTRA_ARTERIAL | Status: DC | PRN
  Administered 2018-08-22: 100 ug via INTRACORONARY
  Administered 2018-08-22 (×3): 200 ug via INTRACORONARY

## 2018-08-22 MED ORDER — SODIUM CHLORIDE 0.9 % IV SOLN
INTRAVENOUS | Status: AC
  Administered 2018-08-22: 13:00:00 via INTRAVENOUS

## 2018-08-22 MED ORDER — TICAGRELOR 90 MG PO TABS
ORAL_TABLET | ORAL | Status: AC
  Filled 2018-08-22: qty 1

## 2018-08-22 MED ORDER — SODIUM CHLORIDE 0.9 % WEIGHT BASED INFUSION: 1.0000 mL/kg/h | INTRAVENOUS | Status: DC

## 2018-08-22 MED ORDER — ASPIRIN 81 MG PO CHEW: 81.0000 mg | CHEWABLE_TABLET | ORAL | Status: DC

## 2018-08-22 MED ORDER — LABETALOL HCL 5 MG/ML IV SOLN
20.0000 mg | Freq: Once | INTRAVENOUS | Status: AC
  Administered 2018-08-22: 20 mg via INTRAVENOUS

## 2018-08-22 MED ORDER — FENTANYL CITRATE (PF) 100 MCG/2ML IJ SOLN
INTRAMUSCULAR | Status: DC | PRN
  Administered 2018-08-22: 50 ug via INTRAVENOUS
  Administered 2018-08-22: 25 ug via INTRAVENOUS

## 2018-08-22 MED ORDER — FENTANYL CITRATE (PF) 100 MCG/2ML IJ SOLN
INTRAMUSCULAR | Status: AC
  Administered 2018-08-22: 25 ug
  Filled 2018-08-22: qty 2

## 2018-08-22 MED ORDER — SODIUM CHLORIDE 0.9% FLUSH: 3.0000 mL | Freq: Two times a day (BID) | INTRAVENOUS | Status: DC

## 2018-08-22 MED ORDER — HEPARIN (PORCINE) IN NACL 1000-0.9 UT/500ML-% IV SOLN
INTRAVENOUS | Status: AC
  Filled 2018-08-22: qty 1000

## 2018-08-22 MED ORDER — TICAGRELOR 90 MG PO TABS: 90.0000 mg | ORAL_TABLET | Freq: Two times a day (BID) | ORAL | 2 refills | Status: DC

## 2018-08-22 MED ORDER — IOHEXOL 350 MG/ML SOLN
INTRAVENOUS | Status: DC | PRN
  Administered 2018-08-22: 215 mL via INTRA_ARTERIAL

## 2018-08-22 MED ORDER — MIDAZOLAM HCL 2 MG/2ML IJ SOLN
INTRAMUSCULAR | Status: AC
  Filled 2018-08-22: qty 2

## 2018-08-22 MED ORDER — HEPARIN (PORCINE) IN NACL 1000-0.9 UT/500ML-% IV SOLN
INTRAVENOUS | Status: DC | PRN
  Administered 2018-08-22 (×2): 500 mL

## 2018-08-22 MED ORDER — LIDOCAINE HCL (PF) 1 % IJ SOLN
INTRAMUSCULAR | Status: AC
  Filled 2018-08-22: qty 30

## 2018-08-22 MED ORDER — ALUM & MAG HYDROXIDE-SIMETH 200-200-20 MG/5ML PO SUSP
30.0000 mL | Freq: Once | ORAL | Status: AC
  Administered 2018-08-22: 30 mL via ORAL
  Filled 2018-08-22: qty 30

## 2018-08-22 MED ORDER — ACETAMINOPHEN 325 MG PO TABS: 650.0000 mg | ORAL_TABLET | ORAL | Status: DC | PRN

## 2018-08-22 MED ORDER — FENTANYL CITRATE (PF) 100 MCG/2ML IJ SOLN
INTRAMUSCULAR | Status: AC
  Filled 2018-08-22: qty 2

## 2018-08-22 MED ORDER — SODIUM CHLORIDE 0.9 % IV SOLN: 250.0000 mL | INTRAVENOUS | Status: DC | PRN

## 2018-08-22 MED ORDER — SODIUM CHLORIDE 0.9% FLUSH: 3.0000 mL | INTRAVENOUS | Status: DC | PRN

## 2018-08-22 MED ORDER — LIDOCAINE HCL (PF) 1 % IJ SOLN
INTRAMUSCULAR | Status: DC | PRN
  Administered 2018-08-22: 15 mL

## 2018-08-22 MED ORDER — ONDANSETRON HCL 4 MG/2ML IJ SOLN: 4.0000 mg | Freq: Four times a day (QID) | INTRAMUSCULAR | Status: DC | PRN

## 2018-08-22 MED ORDER — FENTANYL CITRATE (PF) 100 MCG/2ML IJ SOLN
25.0000 ug | Freq: Once | INTRAMUSCULAR | Status: AC
  Administered 2018-08-22: 25 ug via INTRAVENOUS

## 2018-08-22 MED ORDER — ROSUVASTATIN CALCIUM 20 MG PO TABS
40.0000 mg | ORAL_TABLET | Freq: Every day | ORAL | Status: DC
  Administered 2018-08-22: 40 mg via ORAL
  Filled 2018-08-22: qty 2

## 2018-08-22 MED ORDER — DIAZEPAM 5 MG PO TABS: 5.0000 mg | ORAL_TABLET | Freq: Four times a day (QID) | ORAL | Status: DC | PRN

## 2018-08-22 MED ORDER — HYDRALAZINE HCL 20 MG/ML IJ SOLN: 10.0000 mg | INTRAMUSCULAR | Status: AC | PRN

## 2018-08-22 MED ORDER — ISOSORBIDE MONONITRATE ER 30 MG PO TB24
30.0000 mg | ORAL_TABLET | Freq: Once | ORAL | Status: AC
  Administered 2018-08-22: 30 mg via ORAL
  Filled 2018-08-22: qty 1

## 2018-08-22 MED ORDER — BIVALIRUDIN BOLUS VIA INFUSION - CUPID
INTRAVENOUS | Status: DC | PRN
  Administered 2018-08-22: 75.9 mg via INTRAVENOUS

## 2018-08-22 MED ORDER — TICAGRELOR 90 MG PO TABS: 90.0000 mg | ORAL_TABLET | Freq: Two times a day (BID) | ORAL | Status: DC

## 2018-08-22 MED FILL — BRILINTA 90 MG TABLET: 90 | 30 days supply | Qty: 60 | Fill #0 | Status: TO

## 2018-08-22 SURGICAL SUPPLY — 19 items
BALLN SAPPHIRE 2.0X12 (BALLOONS) ×2
BALLN SAPPHIRE ~~LOC~~ 2.5X18 (BALLOONS) ×2 IMPLANT
BALLOON SAPPHIRE 2.0X12 (BALLOONS) ×1 IMPLANT
CATH INFINITI 5 FR 3DRC (CATHETERS) ×2 IMPLANT
CATH INFINITI 5FR AL1 (CATHETERS) ×2 IMPLANT
CATH INFINITI 5FR MULTPACK ANG (CATHETERS) ×2 IMPLANT
CATH VISTA GUIDE 6FR XBLAD3.5 (CATHETERS) ×2 IMPLANT
COVER DOME SNAP 22 D (MISCELLANEOUS) ×2 IMPLANT
DEVICE CONTINUOUS FLUSH (MISCELLANEOUS) ×2 IMPLANT
KIT ENCORE 26 ADVANTAGE (KITS) ×2 IMPLANT
KIT HEART LEFT (KITS) ×2 IMPLANT
PACK CARDIAC CATHETERIZATION (CUSTOM PROCEDURE TRAY) ×2 IMPLANT
SHEATH PINNACLE 5F 10CM (SHEATH) ×2 IMPLANT
SHEATH PINNACLE 6F 10CM (SHEATH) ×2 IMPLANT
STENT RESOLUTE ONYX 2.5X22 (Permanent Stent) ×2 IMPLANT
TRANSDUCER W/STOPCOCK (MISCELLANEOUS) ×2 IMPLANT
TUBING CIL FLEX 10 FLL-RA (TUBING) ×2 IMPLANT
WIRE EMERALD 3MM-J .035X150CM (WIRE) ×2 IMPLANT
WIRE HI TORQ BMW 190CM (WIRE) ×2 IMPLANT

## 2018-08-22 NOTE — Progress Notes (Signed)
On arrival from cath lab client c/o chest pressure and states it is better than it was prior to procedure; however, still present 1/10 and c/o shortness of breath also; Dr Claiborne Billings notified and order for fentanyl received and per Dr Claiborne Billings client advised to drink caffeine

## 2018-08-22 NOTE — Discharge Summary (Signed)
Discharge Summary/SAME DAY PCI    Patient ID: [name],  MRN: [id number], DOB/AGE: Sep 13, 1955 63 y.o.  Admit date: 08/22/2018 Discharge date: 08/22/2018  Primary Care Provider: Sallee Lange A Primary Cardiologist: Dr. Bronson Ing  Discharge Diagnoses    Active Problems:   Coronary artery disease of native artery of native heart with stable angina pectoris (HCC)   Stable angina pectoris (HCC)   Allergies Allergies  Allergen Reactions  . Morphine And Related Itching and Rash    redness  . Lasix [Furosemide]     Chest pain and tightness  . Latex Rash    Diagnostic Studies/Procedures    Cath: 08/22/18   Prox RCA to Mid RCA lesion is 20% stenosed.  Dist Cx lesion is 85% stenosed.  Prox Cx lesion is 10% stenosed.  Ost Cx lesion is 10% stenosed.  Mid LAD lesion is 85% stenosed.  Post intervention, there is a 0% residual stenosis.  The left ventricular systolic function is normal.  LV end diastolic pressure is normal.  A stent was successfully placed.   Preserved global LV function with an EF estimated at 60 to 65%.  LVEDP 15 mm.  Multivessel CAD with new 85% mid LAD stenosis; 10% ostial 10% proximal and 10% AV groove circumflex stenoses with 80% stenosis in a distal branch of an OM 2 vessel; patent RCA stent with intimal hyperplasia of approximately 20%.  Successful PCI of the mid LAD with ultimate insertion of a 2.5 x 22 mm Resolute Onyx DES stent postdilated to 2.6 mm with the 85% stenosis being reduced to 0%.  RECOMMENDATION: DAPT for minimum of 6 months but preferably longer.  Medical therapy for concomitant CAD.  Optimal blood pressure control with ideal blood pressure less than 120/80.  Aggressive lipid-lowering therapy with target LDL less than 70.  Diagnostic  Dominance: Right    Intervention      _____________   History of Present Illness     [name] is a 63 y.o. male with CAD status post PCI of the RCA in 2014, CHB status  post permanent pacemaker in 2014, Medtronic CRT-P placement 2015, hypertension and HLD.  Patient was last seen by Bernerd Pho, PA-C 03/12/2018 time he was having increase in exertional chest pain and dyspnea on exertion.  Lexiscan Myoview showed mild apical ischemia with preserved EF overall low risk similar to prior imaging in 2018 and echo showed preserved LV function with no wall motion abnormality.  Imdur 15 mg daily was added. He was maintained on hydralazine losartan and nadolol.  Patient saw Dr. Rayann Heman via [facility] 08/12/18 and was having occasional CP and irregular pulse with shortness of breath.  Histogram was normal.  PACs and NSVT were noted on device.  He offered to change him from nadolol to Toprol-XL 100 mg daily but patient declined blood pressure was up and nadolol was increased.  He recommended follow-up with Dr. Bronson Ing and consider cath but patient was reluctant to come to the hospital.  Patient complained of chest tightness and shortness of breath with little exertion at his most recent telehealth visit. Patient under a lot of stress from PTSD and felt that it may be contributing. Could walk 2 miles some days and would get tightness, which was worse going up hill or lifting fertilizer. Also having chest tightness at rest while watching TV. NTG had helped but never took more than 1. Sometimes BP 190/90 but 144/80. Patient was treated for bronchitis in mid  March but  no symptoms now.  The patient does not have symptoms concerning for COVID-19 infection (fever, chills, cough, or new shortness of breath). Given symptoms he was set up for outpatient cardiac cath.   Hospital Course     Underwent cardiac cath noted above with PCI/DESx1 to mLAD. Plan for DAPT with ASA/Brilinta for at least one year. Discussed cardiac rehab while in short stay via phone call 2/2 to COVID precautions. No complications post cath. Femoral site stable . Instructions/precautions regarding cath site care  given prior to discharge.    [name] was seen by Dr. Claiborne Billings and determined stable for discharge home. Follow up in the office has been arranged. Medications are listed below.   _____________  Discharge Vitals Blood pressure (!) 166/81, pulse 66, temperature 97.7 F (36.5 C), temperature source Oral, resp. rate 15, height 6' (1.829 m), weight 101.2 kg, SpO2 100 %.  Filed Weights   08/22/18 0706  Weight: 101.2 kg    Labs & Radiologic Studies    CBC Recent Labs    08/20/18 1454  WBC 8.7  NEUTROABS 5.5  HGB 14.2  HCT 42.7  MCV 89  PLT 169   Basic Metabolic Panel Recent Labs    08/20/18 1454  NA 143  K 4.3  CL 104  CO2 26  GLUCOSE 89  BUN 16  CREATININE 0.92  CALCIUM 9.5   Liver Function Tests No results for input(s): AST, ALT, ALKPHOS, BILITOT, PROT, ALBUMIN in the last 72 hours. No results for input(s): LIPASE, AMYLASE in the last 72 hours. Cardiac Enzymes No results for input(s): CKTOTAL, CKMB, CKMBINDEX, TROPONINI in the last 72 hours. BNP Invalid input(s): POCBNP D-Dimer No results for input(s): DDIMER in the last 72 hours. Hemoglobin A1C No results for input(s): HGBA1C in the last 72 hours. Fasting Lipid Panel No results for input(s): CHOL, HDL, LDLCALC, TRIG, CHOLHDL, LDLDIRECT in the last 72 hours. Thyroid Function Tests No results for input(s): TSH, T4TOTAL, T3FREE, THYROIDAB in the last 72 hours.  Invalid input(s): FREET3 _____________  No results found. Disposition   Pt is being discharged home today in good condition.  Follow-up Plans & Appointments    Follow-up Information    [name], PA-C Follow up on 08/29/2018.   Specialties:  Physician Assistant, Cardiology Why:  at 3:30pm for your follow up appt. This will be a tele-health visit through your mobile phone.  Contact information: [address] [place] [address] [phone]             Discharge Medications     Medication List    STOP taking these  medications   amoxicillin-clavulanate 875-125 MG tablet Commonly known as:  AUGMENTIN     TAKE these medications   ALPRAZolam 0.5 MG tablet Commonly known as:  Xanax Take 1 tablet (0.5 mg total) by mouth 2 (two) times daily as needed for anxiety or sleep.   amLODipine 2.5 MG tablet Commonly known as:  NORVASC Take 1 tablet (2.5 mg total) by mouth daily.   aspirin 81 MG chewable tablet Chew 81 mg by mouth daily.   cyclobenzaprine 5 MG tablet Commonly known as:  FLEXERIL Take 5 mg by mouth at bedtime as needed for muscle spasms.   esomeprazole 40 MG capsule Commonly known as:  NEXIUM TAKE 1 CAPSULE ONCE DAILY   hydrALAZINE 50 MG tablet Commonly known as:  APRESOLINE TAKE 1 TABLET(50 MG) BY MOUTH TWICE DAILY What changed:  See the new instructions.   isosorbide mononitrate 30  MG 24 hr tablet Commonly known as:  IMDUR Take 1 tablet (30 mg total) by mouth daily.   losartan 100 MG tablet Commonly known as:  COZAAR TAKE 1 TABLET(100 MG) BY MOUTH DAILY What changed:  See the new instructions.   nadolol 80 MG tablet Commonly known as:  Corgard Take 1.5 tablets (120 mg total) by mouth daily.   nitroGLYCERIN 0.4 MG SL tablet Commonly known as:  NITROSTAT Place 1 tablet (0.4 mg total) under the tongue every 5 (five) minutes as needed for chest pain (MAX 3 TABLETS).   oxyCODONE-acetaminophen 10-325 MG tablet Commonly known as:  PERCOCET Take 1 tablet by mouth every 4 (four) hours as needed for pain.   promethazine 25 MG tablet Commonly known as:  PHENERGAN TAKE 1/2 TABLET BY MOUTH TWICE DAILY AS NEEDED FOR NAUSEA What changed:    how much to take  how to take this  when to take this  reasons to take this   rosuvastatin 20 MG tablet Commonly known as:  CRESTOR TAKE 1 TABLET BY MOUTH DAILY   tamsulosin 0.4 MG Caps capsule Commonly known as:  FLOMAX TAKE 2 CAPSULES BY MOUTH ONCE DAILY   ticagrelor 90 MG Tabs tablet Commonly known as:  Brilinta Take 1 tablet  (90 mg total) by mouth 2 (two) times daily.        Acute coronary syndrome (MI, NSTEMI, STEMI, etc) this admission?: No.     Outstanding Labs/Studies   N/a   Duration of Discharge Encounter   Greater than 30 minutes including physician time.  Signed, Reino Bellis NP-C 08/22/2018, 2:16 PM

## 2018-08-22 NOTE — Progress Notes (Signed)
After fentanyl given client states pain now 2/10; Dr Claiborne Billings notified and Sharee Pimple PA in to see client

## 2018-08-22 NOTE — Interval H&P Note (Signed)
Cath Lab Visit (complete for each Cath Lab visit)  Clinical Evaluation Leading to the Procedure:   ACS: No.  Non-ACS:    Anginal Classification: CCS III  Anti-ischemic medical therapy: Maximal Therapy (2 or more classes of medications)  Non-Invasive Test Results: No non-invasive testing performed  Prior CABG: No previous CABG      History and Physical Interval Note:  08/22/2018 9:03 AM  [name]  has presented today for surgery, with the diagnosis of chest pain.  The various methods of treatment have been discussed with the patient and family. After consideration of risks, benefits and other options for treatment, the patient has consented to  Procedure(s): LEFT HEART CATH AND CORONARY ANGIOGRAPHY (N/A) as a surgical intervention.  The patient's history has been reviewed, patient examined, no change in status, stable for surgery.  I have reviewed the patient's chart and labs.  Questions were answered to the patient's satisfaction.     Shelva Majestic

## 2018-08-22 NOTE — Discharge Instructions (Signed)
Groin Site Care Refer to this sheet in the next few weeks. These instructions provide you with information on caring for yourself after your procedure. Your caregiver may also give you more specific instructions. Your treatment has been planned according to current medical practices, but problems sometimes occur. Call your caregiver if you have any problems or questions after your procedure. HOME CARE INSTRUCTIONS You may shower 24 hours after the procedure. Remove the bandage (dressing) and gently wash the site with plain soap and water. Gently pat the site dry.  Do not apply powder or lotion to the site.  Do not sit in a bathtub, swimming pool, or whirlpool for 5 to 7 days.  No bending, squatting, or lifting anything over 10 pounds (4.5 kg) as directed by your caregiver.  Inspect the site at least twice daily.  Do not drive home if you are discharged the same day of the procedure. Have someone else drive you.  You may drive 24 hours after the procedure unless otherwise instructed by your caregiver.  What to expect: Any bruising will usually fade within 1 to 2 weeks.  Blood that collects in the tissue (hematoma) may be painful to the touch. It should usually decrease in size and tenderness within 1 to 2 weeks.  SEEK IMMEDIATE MEDICAL CARE IF: You have unusual pain at the groin site or down the affected leg.  You have redness, warmth, swelling, or pain at the groin site.  You have drainage (other than a small amount of blood on the dressing).  You have chills.  You have a fever or persistent symptoms for more than 72 hours.  You have a fever and your symptoms suddenly get worse.  Your leg becomes pale, cool, tingly, or numb.  You have heavy bleeding from the site. Hold pressure on the site. Marland Kitchen  PLEASE DO NOT MISS ANY DOSES OF YOUR BRILINTA!!!!! Also keep a log of you blood pressures and bring back to your follow up appt. Please call the office with any questions.   Patients taking blood  thinners should generally stay away from medicines like ibuprofen, Advil, Motrin, naproxen, and Aleve due to risk of stomach bleeding. You may take Tylenol as directed or talk to your primary doctor about alternatives.

## 2018-08-22 NOTE — Progress Notes (Signed)
    Called to short stay to assess patient after having chest pain with ambulating prior to discharge home. Pt was a same day PCI for Dr. Claiborne Billings in which he received [facility] to [facility]. I saw the patient earlier in the day after being called for a groin check. RN reported chest pain, however pt states it was back pain from laying in the bed for his bedrest. PCI done through right groin approach. EKG performed with no change.   Paged by RN for the chest pain prior to discharge. EKG performed with no change. MD at bedside and was reassured by patient story. GI cocktail ordered as well as labetalol for BP was ordered. Pt was also given a dose of Imdur 30mg . BP was elevated (SBP 208) initially and was reduced with the dose labetalol. Will monitor for approximately 30 minutes and if no relieve, will need to admit to inpatient for observation. ED precautions reviewed. MD to pass information off at handoff to fellow on call.    Kathyrn Drown NP-C [place] Pager: [phone]

## 2018-08-22 NOTE — Progress Notes (Signed)
Site area:  Rt groin fa sheath Site Prior to Removal:  Level 0 Pressure Applied For: 25 minutes Manual:   yes Patient Status During Pull:  stable Post Pull Site:  Level  0 Post Pull Instructions Given:  yes Post Pull Pulses Present: rt dp palpable Dressing Applied:  Gauze and tegaderm Bedrest begins @ 1330 Comments:

## 2018-08-22 NOTE — Progress Notes (Signed)
D/c instructions given to wife Mariann Laster, via telephone, due to [place] restrictions.  All questions answered and Mariann Laster verbalized understanding of instructions

## 2018-08-22 NOTE — Progress Notes (Signed)
Paulino Door, NP about patient c/o sharp pain in chest and shoulders post ambulation prior to d/c.  See NP note

## 2018-08-22 NOTE — Progress Notes (Signed)
Cardiac Rehab Advisory Cardiac Rehab Phase I is not seeing pts face to face at this time due to Covid 19 restrictions. Ambulation is occurring through nursing, PT, and mobility teams. We will help facilitate that process as needed. We are calling pts in their rooms and discussing education. We will then deliver education materials to pts RN for delivery to pt.   [id number] PCI/stent discharge education completed with patient including restrictions, Brilinta use, CP, NTG, and calling 911, risk factor modification, and activity progression. Pt verbalizes understanding of instructions given. Discussed phase 2 cardiac rehab, and pt is interested in the program at [facility], referral sent.   Sol Passer, MS, ACSM CEP

## 2018-08-22 NOTE — Progress Notes (Signed)
Patient states relied of pain.  Would like to and feels ok to go home.

## 2018-08-22 NOTE — Progress Notes (Signed)
Client states no change in chest discomfort; however, back pain increased to 4/10 and client states does not need pain medication at present

## 2018-08-23 ENCOUNTER — Encounter (HOSPITAL_COMMUNITY): Payer: Self-pay | Admitting: Cardiovascular Disease

## 2018-08-29 ENCOUNTER — Telehealth (INDEPENDENT_AMBULATORY_CARE_PROVIDER_SITE_OTHER): Payer: BLUE CROSS/BLUE SHIELD | Admitting: Student

## 2018-08-29 VITALS — BP 136/64 | HR 69 | Ht 72.0 in | Wt 221.0 lb

## 2018-08-29 DIAGNOSIS — I10 Essential (primary) hypertension: Secondary | ICD-10-CM

## 2018-08-29 DIAGNOSIS — I25118 Atherosclerotic heart disease of native coronary artery with other forms of angina pectoris: Secondary | ICD-10-CM | POA: Diagnosis not present

## 2018-08-29 DIAGNOSIS — I441 Atrioventricular block, second degree: Secondary | ICD-10-CM | POA: Diagnosis not present

## 2018-08-29 DIAGNOSIS — E785 Hyperlipidemia, unspecified: Secondary | ICD-10-CM | POA: Diagnosis not present

## 2018-08-29 DIAGNOSIS — Z7189 Other specified counseling: Secondary | ICD-10-CM

## 2018-08-29 NOTE — Patient Instructions (Signed)
Medication Instructions:  Your physician recommends that you continue on your current medications as directed. Please refer to the Current Medication list given to you today.  Take your Nexium at night.   Labwork: NONE  Testing/Procedures: NONE  Follow-Up: Your physician recommends that you schedule a follow-up appointment in: June with Dr. Bronson Ing   Any Other Special Instructions Will Be Listed Below (If Applicable).     If you need a refill on your cardiac medications before your next appointment, please call your pharmacy.  Thank you for choosing [facility]!

## 2018-08-29 NOTE — Progress Notes (Signed)
Virtual Visit via Video Note   This visit type was conducted due to national recommendations for restrictions regarding the COVID-19 Pandemic (e.g. social distancing) in an effort to limit this patient's exposure and mitigate transmission in our community.  Due to his co-morbid illnesses, this patient is at least at moderate risk for complications without adequate follow up.  This format is felt to be most appropriate for this patient at this time.  All issues noted in this document were discussed and addressed.  A limited physical exam was performed with this format.  Please refer to the patient's chart for his consent to telehealth for [facility].   Date:  08/30/2018   ID:  [name], DOB 11-05-1955, MRN [id number]  Patient Location: Home Provider Location: Home  PCP:  Kathyrn Drown, MD  Cardiologist:  Kate Sable, MD  Electrophysiologist:  Thompson Grayer, MD   Evaluation Performed:  Follow-Up Visit  Chief Complaint:  Recent cardiac catheterization  History of Present Illness:    [name] is a 63 y.o. male with past medical history of CAD (s/pPCI of the RCA in 2014), CHB (s/p PPM Placement in 12/2012 with Medtronic CRT-P placement in 03/2014), HTN, HLD, and GERD who presents for follow-up from his recent cardiac catheterization.   He had a telehealth visit with Ermalinda Barrios, PA-C on 08/19/2018 and reported worsening chest pain and dyspnea on exertion which was relieved with SL NTG. This was reviewed with the DOD and a cardiac catheterization was recommended for definitive evaluation. This was performed by Dr. Claiborne Billings on 08/22/2018 and he was found to have multivessel CAD with new 85% mid LAD stenosis; 10% ostial 10% proximal and 10% AV groove circumflex stenoses with 80% stenosis in a distal branch of an OM 2 vessel. Had a patent RCA stent with intimal hyperplasia of approximately 20%. He underwent successful PCI of the mid LAD with ultimate insertion of a 2.5 x 22 mm  Resolute Onyx DES. Was recommended to be on DAPT for a minimum of 6 months but preferably longer.    In talking with the patient today, he is unsure if his chest pain actually improved after stent placement. The patient and his wife have been going for 1 mile walks each day and he denies any chest pain with this. However, he continues to experience chest pain in the evening hours which improves with belching. He takes Nexium but usually around breakfast time. Denies any dyspnea on exertion, orthopnea, PND, or edema.   Reports good compliance with ASA and Brilinta and denies missing any recent doses. Does have some dyspnea after taking the medication. Has not yet tried taking with caffeine. No melena, hematochezia, or hematuria. He did have significant bruising along his groin cath site but says this has improved. No swelling or drainage along the site.   The patient does not have symptoms concerning for COVID-19 infection (fever, chills, cough, or new shortness of breath).    Past Medical History:  Diagnosis Date  . ADD (attention deficit disorder)    Rx-Adderall  . Anginal pain ([place])   . AV block, 3rd degree (H[facility])   . Colon polyps 2011   tubular adenoma by excisional biopsy during colonoscopy  . Complete heart block [facility])    a. s/p PPM Placement in 12/2012 with Medtronic CRT-P placement in 03/2014  . Coronary artery disease    a. s/p PCI of the RCA in 2014 b. 08/2018: patent RCA stent with new mid-LAD 85% stenosis (  treated with PCI/DES) and 85% distal OM2 stenosis (med management recommended)  . Degenerative disc disease, cervical   . Gastrointestinal bleeding 10/15/2017  . GERD (gastroesophageal reflux disease)   . Hypertension   . Pacemaker   . Palpitations 2003   Holter in 2003: PACs and PVCs; negative stress nuclear test in 2005; right bundle branch block; echo in 2008-mild LVH, otherwise normal; 2008-negative stress nuclear test.  . Pneumonia   . Prostatitis    prostate  calcifications by CT  . Sleep apnea    questionable diagnosis   Past Surgical History:  Procedure Laterality Date  . ANTERIOR CERVICAL DECOMP/DISCECTOMY FUSION    . BACK SURGERY    . Biventricular pacemaker upgrade/ system revision  04/02/14   removal of previous atrial and ventricular leads with placement of a new MDT Consulta CRT-P system at [facility] by Dr Violet Baldy  . COLONOSCOPY    . COLONOSCOPY W/ POLYPECTOMY  2011   tubular adenoma  . CORONARY STENT INTERVENTION N/A 08/22/2018   Procedure: CORONARY STENT INTERVENTION;  Surgeon: Troy Sine, MD;  Location: [place] CV LAB;  Service: Cardiovascular;  Laterality: N/A;  . ESOPHAGOGASTRODUODENOSCOPY     Gastritis  . INSERT / REPLACE / REMOVE PACEMAKER  12/17/2012   MDT Adapta L implanted by Dr Rayann Heman for mobitz II second degree AV block  . KNEE SURGERY Right   . LEFT HEART CATH AND CORONARY ANGIOGRAPHY N/A 08/22/2018   Procedure: LEFT HEART CATH AND CORONARY ANGIOGRAPHY;  Surgeon: Troy Sine, MD;  Location: [place] CV LAB;  Service: Cardiovascular;  Laterality: N/A;  . LEFT HEART CATHETERIZATION WITH CORONARY ANGIOGRAM N/A 10/17/2012   Procedure: LEFT HEART CATHETERIZATION WITH CORONARY ANGIOGRAM;  Surgeon: Josue Hector, MD;  Location: [facility] CATH LAB;  Service: Cardiovascular;  Laterality: N/A;  . LEFT HEART CATHETERIZATION WITH CORONARY ANGIOGRAM N/A 12/17/2012   Procedure: LEFT HEART CATHETERIZATION WITH CORONARY ANGIOGRAM;  Surgeon: Peter M Martinique, MD;  Location: [facility] CATH LAB;  Service: Cardiovascular;  Laterality: N/A;  . LEFT HEART CATHETERIZATION WITH CORONARY ANGIOGRAM N/A 06/11/2013   Procedure: LEFT HEART CATHETERIZATION WITH CORONARY ANGIOGRAM;  Surgeon: Wellington Hampshire, MD;  Location: [place] CATH LAB;  Service: Cardiovascular;  Laterality: N/A;  . PERCUTANEOUS CORONARY STENT INTERVENTION (PCI-S)  10/17/2012   Procedure: PERCUTANEOUS CORONARY STENT INTERVENTION (PCI-S);  Surgeon: Josue Hector, MD;  Location:  [facility] CATH LAB;  Service: Cardiovascular;;  . PERMANENT PACEMAKER INSERTION N/A 12/17/2012   Procedure: PERMANENT PACEMAKER INSERTION;  Surgeon: Thompson Grayer, MD;  Location: [facility] CATH LAB;  Service: Cardiovascular;  Laterality: N/A;  . POLYPECTOMY    . UPPER GASTROINTESTINAL ENDOSCOPY       Current Meds  Medication Sig  . ALPRAZolam (XANAX) 0.5 MG tablet Take 1 tablet (0.5 mg total) by mouth 2 (two) times daily as needed for anxiety or sleep.  Marland Kitchen amLODipine (NORVASC) 2.5 MG tablet Take 1 tablet (2.5 mg total) by mouth daily.  Marland Kitchen aspirin 81 MG chewable tablet Chew 81 mg by mouth daily.  . cyclobenzaprine (FLEXERIL) 5 MG tablet Take 5 mg by mouth at bedtime as needed for muscle spasms.  Marland Kitchen esomeprazole (NEXIUM) 40 MG capsule TAKE 1 CAPSULE ONCE DAILY (Patient taking differently: Take 40 mg by mouth daily. )  . hydrALAZINE (APRESOLINE) 50 MG tablet TAKE 1 TABLET(50 MG) BY MOUTH TWICE DAILY (Patient taking differently: Take 50 mg by mouth 2 (two) times a day. )  . isosorbide mononitrate (IMDUR) 30 MG 24 hr tablet Take  1 tablet (30 mg total) by mouth daily.  Marland Kitchen losartan (COZAAR) 100 MG tablet TAKE 1 TABLET(100 MG) BY MOUTH DAILY (Patient taking differently: Take 100 mg by mouth daily. )  . nadolol (CORGARD) 80 MG tablet Take 1.5 tablets (120 mg total) by mouth daily.  . nitroGLYCERIN (NITROSTAT) 0.4 MG SL tablet Place 1 tablet (0.4 mg total) under the tongue every 5 (five) minutes as needed for chest pain (MAX 3 TABLETS).  Marland Kitchen oxyCODONE-acetaminophen (PERCOCET) 10-325 MG tablet Take 1 tablet by mouth every 4 (four) hours as needed for pain.  . promethazine (PHENERGAN) 25 MG tablet TAKE 1/2 TABLET BY MOUTH TWICE DAILY AS NEEDED FOR NAUSEA (Patient taking differently: Take 12.5 mg by mouth 2 (two) times daily as needed for nausea. TAKE 1/2 TABLET BY MOUTH TWICE DAILY AS NEEDED FOR NAUSEA)  . rosuvastatin (CRESTOR) 20 MG tablet TAKE 1 TABLET BY MOUTH DAILY  . tamsulosin (FLOMAX) 0.4 MG CAPS capsule TAKE 2 CAPSULES  BY MOUTH ONCE DAILY  . ticagrelor (BRILINTA) 90 MG TABS tablet Take 1 tablet (90 mg total) by mouth 2 (two) times daily.     Allergies:   Morphine and related; Lasix [furosemide]; and Latex   Social History   Tobacco Use  . Smoking status: Former Smoker    Packs/day: 2.50    Years: 40.00    Pack years: 100.00    Types: Cigarettes    Start date: 04/18/1967    Last attempt to quit: 12/20/2003    Years since quitting: 14.7  . Smokeless tobacco: Former Network engineer Use Topics  . Alcohol use: Yes    Alcohol/week: 1.0 standard drinks    Types: 1 Cans of beer per week  . Drug use: Not Currently    Types: Marijuana    Comment: 30 + years ago - "crank, marjiuanna, ect."     Family Hx: The patient's family history includes Breast cancer in his maternal aunt; Heart disease in his mother; Hypertension in his mother; Prostate cancer in his brother. There is no history of Colon cancer or Colon polyps.  ROS:   Please see the history of present illness.     All other systems reviewed and are negative.   Prior CV studies:   The following studies were reviewed today:  Cardiac Catheterization: 08/22/2018  Prox RCA to Mid RCA lesion is 20% stenosed.  Dist Cx lesion is 85% stenosed.  Prox Cx lesion is 10% stenosed.  Ost Cx lesion is 10% stenosed.  Mid LAD lesion is 85% stenosed.  Post intervention, there is a 0% residual stenosis.  The left ventricular systolic function is normal.  LV end diastolic pressure is normal.  A stent was successfully placed.   Preserved global LV function with an EF estimated at 60 to 65%.  LVEDP 15 mm.  Multivessel CAD with new 85% mid LAD stenosis; 10% ostial 10% proximal and 10% AV groove circumflex stenoses with 80% stenosis in a distal branch of an OM 2 vessel; patent RCA stent with intimal hyperplasia of approximately 20%.  Successful PCI of the mid LAD with ultimate insertion of a 2.5 x 22 mm Resolute Onyx DES stent postdilated to 2.6 mm  with the 85% stenosis being reduced to 0%.  RECOMMENDATION: DAPT for minimum of 6 months but preferably longer.  Medical therapy for concomitant CAD.  Optimal blood pressure control with ideal blood pressure less than 120/80.  Aggressive lipid-lowering therapy with target LDL less than 70.  Labs/Other Tests and Data Reviewed:  EKG:  An ECG dated 08/22/2018 was personally reviewed today and demonstrated:  AV-paced, HR 62.  Recent Labs: 10/15/2017: ALT 23 12/05/2017: BNP 103.7; TSH 2.920 08/20/2018: BUN 16; Creatinine, Ser 0.92; Hemoglobin 14.2; Platelets 219; Potassium 4.3; Sodium 143   Recent Lipid Panel Lab Results  Component Value Date/Time   CHOL 119 05/08/2018 10:02 AM   TRIG 64 05/08/2018 10:02 AM   HDL 52 05/08/2018 10:02 AM   CHOLHDL 2.3 05/08/2018 10:02 AM   CHOLHDL 2.5 12/17/2012 02:15 AM   LDLCALC 54 05/08/2018 10:02 AM    Wt Readings from Last 3 Encounters:  08/29/18 221 lb (100.2 kg)  08/22/18 223 lb (101.2 kg)  08/19/18 223 lb (101.2 kg)     Objective:    Vital Signs:  BP 136/64   Pulse 69   Ht 6' (1.829 m)   Wt 221 lb (100.2 kg)   SpO2 96%   BMI 29.97 kg/m    General: Pleasant Caucasian male appearing NAD Psych: Normal affect. Neuro: Alert and oriented X 3. Moves all extremities spontaneously. HEENT: Normal in appearance.  Lungs:  Resp regular and unlabored in appearance.   ASSESSMENT & PLAN:    1. CAD - he is s/pPCI of the RCA in 2014 and recent cardiac catheterization earlier this month showed multivessel CAD with new 85% mid LAD stenosis, 10% ostial 10% proximal and 10% AV groove circumflex stenoses with 80% stenosis in a distal branch of an OM 2 vessel. Had a patent RCA stent with intimal hyperplasia of approximately 20%. He underwent successful PCI of the mid LAD with ultimate insertion of a 2.5 x 22 mm Resolute Onyx DES.  - he denies any exertional chest pain but does have episodes of discomfort at night which can last for hours and is typically  relieved with belching. His current symptoms seem atypical for angina and most consistent with GERD. He is currently taking Nexium and I recommended he take this in the evening hours and reassess symptoms. If helpful, Nexium could be titrated to BID dosing. His symptoms could also be secondary to small-vessel disease as he had 85% distal OM2 stenosis with medical management recommended at that time. Could titrate Imdur from 30mg  daily to 60mg  daily if symptoms persist.  - continue DAPT with ASA and Brilinta (having intermittent dyspnea when taking the medication and I encouraged him to try taking with caffeine) along with BB and statin therapy.   2. CHB - s/p PPM Placement in 12/2012 with Medtronic CRT-P placement in 03/2014. Interrogation in 05/2018 showed normal device function but did have episodes of NSVT with Nadolol being titrated at that time.  - followed by Dr. Rayann Heman.   3. HTN - BP has been well-controlled when checked at home, at 136/64 on most recent check.  - continue Amlodipine 2.5mg  daily, Hydralazine 50mg  BID, Imdur 30mg  daily, and Nadolol 120mg  daily.   4. HLD - followed by PCP. LDL at 55 when checked in 2019. Continue Crestor 20mg  daily.   5. COVID-19 Education: - The signs and symptoms of COVID-19 were discussed with the patient. The importance of social distancing was discussed today.  Time:   Today, I have spent 28 minutes with the patient with telehealth technology discussing the above problems.     Medication Adjustments/Labs and Tests Ordered: Current medicines are reviewed at length with the patient today.  Concerns regarding medicines are outlined above.   Tests Ordered: No orders of the defined types were placed in this encounter.   Medication  Changes: No orders of the defined types were placed in this encounter.   Disposition: Keep scheduled follow-up with Dr. Bronson Ing in 09/2018 for reassessment of symptoms.   Signed, [name], PA-C   08/30/2018 10:13 AM    [place]

## 2018-08-30 ENCOUNTER — Encounter: Payer: Self-pay | Admitting: Student

## 2018-09-05 ENCOUNTER — Encounter: Payer: Self-pay | Admitting: Family Medicine

## 2018-09-09 ENCOUNTER — Other Ambulatory Visit: Payer: Self-pay | Admitting: Family Medicine

## 2018-09-09 MED ORDER — OXYCODONE-ACETAMINOPHEN 10-325 MG PO TABS: 1.0000 | ORAL_TABLET | ORAL | 0 refills | Status: DC | PRN

## 2018-09-11 ENCOUNTER — Ambulatory Visit (INDEPENDENT_AMBULATORY_CARE_PROVIDER_SITE_OTHER): Payer: BC Managed Care – PPO | Admitting: *Deleted

## 2018-09-11 DIAGNOSIS — I441 Atrioventricular block, second degree: Secondary | ICD-10-CM | POA: Diagnosis not present

## 2018-09-12 ENCOUNTER — Other Ambulatory Visit: Payer: Self-pay | Admitting: Family Medicine

## 2018-09-12 LAB — CUP PACEART REMOTE DEVICE CHECK
Battery Remaining Longevity: 48 mo
Battery Voltage: 2.99 V
Brady Statistic AP VP Percent: 27.3 %
Brady Statistic AP VS Percent: 0.1 %
Brady Statistic AS VP Percent: 72.7 %
Brady Statistic AS VS Percent: 0.1 %
Brady Statistic RA Percent Paced: 27.4 %
Brady Statistic RV Percent Paced: 100 %
Date Time Interrogation Session: 20200528103240
Implantable Lead Implant Date: 20151217
Implantable Lead Implant Date: 20151217
Implantable Lead Implant Date: 20151217
Implantable Lead Location: 753858
Implantable Lead Location: 753859
Implantable Lead Location: 753860
Implantable Lead Model: 4196
Implantable Lead Model: 5076
Implantable Lead Model: 5076
Implantable Pulse Generator Implant Date: 20151217
Lead Channel Impedance Value: 399 Ohm
Lead Channel Impedance Value: 456 Ohm
Lead Channel Impedance Value: 532 Ohm
Lead Channel Sensing Intrinsic Amplitude: 1.8 mV
Lead Channel Sensing Intrinsic Amplitude: 20 mV
Lead Channel Setting Pacing Amplitude: 2 V
Lead Channel Setting Pacing Amplitude: 2.25 V
Lead Channel Setting Pacing Amplitude: 2.5 V
Lead Channel Setting Pacing Pulse Width: 0.4 ms
Lead Channel Setting Pacing Pulse Width: 0.4 ms
Lead Channel Setting Sensing Sensitivity: 0.9 mV

## 2018-09-12 MED ORDER — ALPRAZOLAM 0.5 MG PO TABS: 0.5000 mg | ORAL_TABLET | Freq: Two times a day (BID) | ORAL | 1 refills | Status: DC | PRN

## 2018-09-16 DIAGNOSIS — F4312 Post-traumatic stress disorder, chronic: Secondary | ICD-10-CM | POA: Diagnosis not present

## 2018-09-17 ENCOUNTER — Telehealth: Payer: BLUE CROSS/BLUE SHIELD | Admitting: Cardiovascular Disease

## 2018-09-17 ENCOUNTER — Other Ambulatory Visit: Payer: Self-pay

## 2018-09-17 MED ORDER — TICAGRELOR 90 MG PO TABS: 90.0000 mg | ORAL_TABLET | Freq: Two times a day (BID) | ORAL | 6 refills | Status: DC

## 2018-09-17 NOTE — Telephone Encounter (Signed)
Refilled Brilinta

## 2018-09-20 ENCOUNTER — Encounter: Payer: Self-pay | Admitting: Cardiology

## 2018-09-20 NOTE — Progress Notes (Signed)
Remote pacemaker transmission.   

## 2018-09-23 ENCOUNTER — Encounter: Payer: Self-pay | Admitting: Cardiovascular Disease

## 2018-09-23 ENCOUNTER — Telehealth (INDEPENDENT_AMBULATORY_CARE_PROVIDER_SITE_OTHER): Payer: BC Managed Care – PPO | Admitting: Cardiovascular Disease

## 2018-09-23 ENCOUNTER — Telehealth (HOSPITAL_COMMUNITY): Payer: Self-pay

## 2018-09-23 VITALS — BP 145/67 | HR 69 | Ht 72.0 in | Wt 221.0 lb

## 2018-09-23 DIAGNOSIS — Z955 Presence of coronary angioplasty implant and graft: Secondary | ICD-10-CM

## 2018-09-23 DIAGNOSIS — I10 Essential (primary) hypertension: Secondary | ICD-10-CM | POA: Diagnosis not present

## 2018-09-23 DIAGNOSIS — I25118 Atherosclerotic heart disease of native coronary artery with other forms of angina pectoris: Secondary | ICD-10-CM

## 2018-09-23 DIAGNOSIS — R0600 Dyspnea, unspecified: Secondary | ICD-10-CM

## 2018-09-23 DIAGNOSIS — Z950 Presence of cardiac pacemaker: Secondary | ICD-10-CM

## 2018-09-23 DIAGNOSIS — R0609 Other forms of dyspnea: Secondary | ICD-10-CM

## 2018-09-23 DIAGNOSIS — I442 Atrioventricular block, complete: Secondary | ICD-10-CM

## 2018-09-23 DIAGNOSIS — E785 Hyperlipidemia, unspecified: Secondary | ICD-10-CM

## 2018-09-23 DIAGNOSIS — R079 Chest pain, unspecified: Secondary | ICD-10-CM

## 2018-09-23 MED ORDER — ISOSORBIDE MONONITRATE ER 60 MG PO TB24: 60.0000 mg | ORAL_TABLET | Freq: Every day | ORAL | 3 refills | Status: DC

## 2018-09-23 MED ORDER — CLOPIDOGREL BISULFATE 75 MG PO TABS: ORAL_TABLET | ORAL | 3 refills | Status: DC

## 2018-09-23 MED ORDER — NADOLOL 80 MG PO TABS: 80.0000 mg | ORAL_TABLET | Freq: Every day | ORAL | 3 refills | Status: DC

## 2018-09-23 NOTE — Progress Notes (Signed)
Virtual Visit via Telephone Note   This visit type was conducted due to national recommendations for restrictions regarding the COVID-19 Pandemic (e.g. social distancing) in an effort to limit this patient's exposure and mitigate transmission in our community.  Due to his co-morbid illnesses, this patient is at least at moderate risk for complications without adequate follow up.  This format is felt to be most appropriate for this patient at this time.  The patient did not have access to video technology/had technical difficulties with video requiring transitioning to audio format only (telephone).  All issues noted in this document were discussed and addressed.  No physical exam could be performed with this format.  Please refer to the patient's chart for his  consent to telehealth for [facility].   Date:  09/23/2018   ID:  [name], DOB [birth year], MRN [id number]  Patient Location: Home Provider Location: Home  PCP:  Kathyrn Drown, MD  Cardiologist:  Kate Sable, MD  Electrophysiologist:  Thompson Grayer, MD   Evaluation Performed:  Follow-Up Visit  Chief Complaint:  CAD  History of Present Illness:    [name] is a 63 y.o. male with coronary artery disease and underwent drug-eluting stent placement to the mid LAD on 08/22/2018.  He has a history of prior PCI of the RCA in 2014.  He has complete heart block and underwent pacemaker placement in September 2014 with Medtronic CRT-P in December 2015.  He also has hypertension, hyperlipidemia, and GERD.  He had a telehealth visit with B. Strader PA-C on 08/29/2018.  He mentioned he was having chest pain in the evening hours alleviated with belching.He was also having episodic shortness of breath.  She recommended he take Brilinta with caffeine and also recommended he take Nexium in the evening.  Today on reassessment of symptoms, he said he can walk 4 miles on Sundays and then the next day can only walk 1/2 mile.  He does  have exertional chest pain and dyspnea.  He can also have episodic shortness of breath at rest.  He drinks a cup of coffee every morning.  He also mentions that he feels better if he takes Xanax before his walks.  The patient does not have symptoms concerning for COVID-19 infection (fever, chills, cough, or new shortness of breath).    Past Medical History:  Diagnosis Date  . ADD (attention deficit disorder)    Rx-Adderall  . Anginal pain ([place])   . AV block, 3rd degree (HCC)   . Colon polyps 2011   tubular adenoma by excisional biopsy during colonoscopy  . Complete heart block [facility])    a. s/p PPM Placement in 12/2012 with Medtronic CRT-P placement in 03/2014  . Coronary artery disease    a. s/p PCI of the RCA in 2014 b. 08/2018: patent RCA stent with new mid-LAD 85% stenosis (treated with PCI/DES) and 85% distal OM2 stenosis (med management recommended)  . Degenerative disc disease, cervical   . Gastrointestinal bleeding 10/15/2017  . GERD (gastroesophageal reflux disease)   . Hypertension   . Pacemaker   . Palpitations 2003   Holter in 2003: PACs and PVCs; negative stress nuclear test in 2005; right bundle branch block; echo in 2008-mild LVH, otherwise normal; 2008-negative stress nuclear test.  . Pneumonia   . Prostatitis    prostate calcifications by CT  . Sleep apnea    questionable diagnosis   Past Surgical History:  Procedure Laterality Date  . ANTERIOR CERVICAL DECOMP/DISCECTOMY FUSION    .  BACK SURGERY    . Biventricular pacemaker upgrade/ system revision  04/02/14   removal of previous atrial and ventricular leads with placement of a new MDT Consulta CRT-P system at [facility] by Dr Violet Baldy  . COLONOSCOPY    . COLONOSCOPY W/ POLYPECTOMY  2011   tubular adenoma  . CORONARY STENT INTERVENTION N/A 08/22/2018   Procedure: CORONARY STENT INTERVENTION;  Surgeon: Troy Sine, MD;  Location: [facility];  Service: Cardiovascular;  Laterality: N/A;   . ESOPHAGOGASTRODUODENOSCOPY     Gastritis  . INSERT / REPLACE / REMOVE PACEMAKER  12/17/2012   MDT Adapta L implanted by Dr Rayann Heman for mobitz II second degree AV block  . KNEE SURGERY Right   . LEFT HEART CATH AND CORONARY ANGIOGRAPHY N/A 08/22/2018   Procedure: LEFT HEART CATH AND CORONARY ANGIOGRAPHY;  Surgeon: Troy Sine, MD;  Location: [place] [facility];  Service: Cardiovascular;  Laterality: N/A;  . LEFT HEART CATHETERIZATION WITH CORONARY ANGIOGRAM N/A 10/17/2012   Procedure: LEFT HEART CATHETERIZATION WITH CORONARY ANGIOGRAM;  Surgeon: Josue Hector, MD;  Location: [facility] CATH LAB;  Service: Cardiovascular;  Laterality: N/A;  . LEFT HEART CATHETERIZATION WITH CORONARY ANGIOGRAM N/A 12/17/2012   Procedure: LEFT HEART CATHETERIZATION WITH CORONARY ANGIOGRAM;  Surgeon: Peter M Martinique, MD;  Location: [facility] CATH LAB;  Service: Cardiovascular;  Laterality: N/A;  . LEFT HEART CATHETERIZATION WITH CORONARY ANGIOGRAM N/A 06/11/2013   Procedure: LEFT HEART CATHETERIZATION WITH CORONARY ANGIOGRAM;  Surgeon: Wellington Hampshire, MD;  Location: [place] CATH LAB;  Service: Cardiovascular;  Laterality: N/A;  . PERCUTANEOUS CORONARY STENT INTERVENTION (PCI-S)  10/17/2012   Procedure: PERCUTANEOUS CORONARY STENT INTERVENTION (PCI-S);  Surgeon: Josue Hector, MD;  Location: [facility] CATH LAB;  Service: Cardiovascular;;  . PERMANENT PACEMAKER INSERTION N/A 12/17/2012   Procedure: PERMANENT PACEMAKER INSERTION;  Surgeon: Thompson Grayer, MD;  Location: [facility] CATH LAB;  Service: Cardiovascular;  Laterality: N/A;  . POLYPECTOMY    . UPPER GASTROINTESTINAL ENDOSCOPY       Current Meds  Medication Sig  . ALPRAZolam (XANAX) 0.5 MG tablet Take 1 tablet (0.5 mg total) by mouth 2 (two) times daily as needed for anxiety or sleep.  Marland Kitchen amLODipine (NORVASC) 2.5 MG tablet Take 1 tablet (2.5 mg total) by mouth daily.  Marland Kitchen aspirin 81 MG chewable tablet Chew 81 mg by mouth daily.  . cyclobenzaprine (FLEXERIL) 5 MG tablet Take 5 mg by mouth at  bedtime as needed for muscle spasms.  Marland Kitchen esomeprazole (NEXIUM) 40 MG capsule TAKE 1 CAPSULE ONCE DAILY (Patient taking differently: Take 40 mg by mouth daily. )  . hydrALAZINE (APRESOLINE) 50 MG tablet TAKE 1 TABLET(50 MG) BY MOUTH TWICE DAILY (Patient taking differently: Take 50 mg by mouth 2 (two) times a day. )  . isosorbide mononitrate (IMDUR) 30 MG 24 hr tablet Take 1 tablet (30 mg total) by mouth daily.  Marland Kitchen losartan (COZAAR) 100 MG tablet TAKE 1 TABLET(100 MG) BY MOUTH DAILY (Patient taking differently: Take 100 mg by mouth daily. )  . nadolol (CORGARD) 80 MG tablet Take 1.5 tablets (120 mg total) by mouth daily.  . nitroGLYCERIN (NITROSTAT) 0.4 MG SL tablet Place 1 tablet (0.4 mg total) under the tongue every 5 (five) minutes as needed for chest pain (MAX 3 TABLETS).  Marland Kitchen oxyCODONE-acetaminophen (PERCOCET) 10-325 MG tablet Take 1 tablet by mouth every 4 (four) hours as needed for pain.  . promethazine (PHENERGAN) 25 MG tablet TAKE 1/2 TABLET BY MOUTH TWICE DAILY AS  NEEDED FOR NAUSEA (Patient taking differently: Take 12.5 mg by mouth 2 (two) times daily as needed for nausea. TAKE 1/2 TABLET BY MOUTH TWICE DAILY AS NEEDED FOR NAUSEA)  . rosuvastatin (CRESTOR) 20 MG tablet TAKE 1 TABLET BY MOUTH DAILY  . tamsulosin (FLOMAX) 0.4 MG CAPS capsule TAKE 2 CAPSULES BY MOUTH ONCE DAILY  . ticagrelor (BRILINTA) 90 MG TABS tablet Take 1 tablet (90 mg total) by mouth 2 (two) times daily.     Allergies:   Morphine and related; Lasix [furosemide]; and Latex   Social History   Tobacco Use  . Smoking status: Former Smoker    Packs/day: 2.50    Years: 40.00    Pack years: 100.00    Types: Cigarettes    Start date: 04/18/1967    Last attempt to quit: 12/20/2003    Years since quitting: 14.7  . Smokeless tobacco: Former Network engineer Use Topics  . Alcohol use: Yes    Alcohol/week: 1.0 standard drinks    Types: 1 Cans of beer per week  . Drug use: Not Currently    Types: Marijuana    Comment: 30 +  years ago - "crank, marjiuanna, ect."     Family Hx: The patient's family history includes Breast cancer in his maternal aunt; Heart disease in his mother; Hypertension in his mother; Prostate cancer in his brother. There is no history of Colon cancer or Colon polyps.  ROS:   Please see the history of present illness.     All other systems reviewed and are negative.   Prior CV studies:   The following studies were reviewed today:  Cardiac Catheterization: 08/22/2018  Prox RCA to Mid RCA lesion is 20% stenosed.  Dist Cx lesion is 85% stenosed.  Prox Cx lesion is 10% stenosed.  Ost Cx lesion is 10% stenosed.  Mid LAD lesion is 85% stenosed.  Post intervention, there is a 0% residual stenosis.  The left ventricular systolic function is normal.  LV end diastolic pressure is normal.  A stent was successfully placed.  Preserved global LV function with an EF estimated at 60 to 65%. LVEDP 15 mm.  Multivessel CAD with new 85% mid LAD stenosis; 10% ostial 10% proximal and 10% AV groove circumflex stenoses with 80% stenosis in a distal branch of an OM 2 vessel; patent RCA stent with intimal hyperplasia of approximately 20%.  Successful PCI of the mid LAD with ultimate insertion of a 2.5 x 22 mm Resolute Onyx DES stent postdilated to 2.6 mm with the 85% stenosis being reduced to 0%.  RECOMMENDATION: DAPT for minimum of 6 months but preferably longer. Medical therapy for concomitant CAD. Optimal blood pressure control with ideal blood pressure less than 120/80. Aggressive lipid-lowering therapy with target LDL less than 70.  Labs/Other Tests and Data Reviewed:    EKG:  No ECG reviewed.  Recent Labs: 10/15/2017: ALT 23 12/05/2017: BNP 103.7; TSH 2.920 08/20/2018: BUN 16; Creatinine, Ser 0.92; Hemoglobin 14.2; Platelets 219; Potassium 4.3; Sodium 143   Recent Lipid Panel Lab Results  Component Value Date/Time   CHOL 119 05/08/2018 10:02 AM   TRIG 64 05/08/2018 10:02 AM    HDL 52 05/08/2018 10:02 AM   CHOLHDL 2.3 05/08/2018 10:02 AM   CHOLHDL 2.5 12/17/2012 02:15 AM   LDLCALC 54 05/08/2018 10:02 AM    Wt Readings from Last 3 Encounters:  09/23/18 221 lb (100.2 kg)  08/29/18 221 lb (100.2 kg)  08/22/18 223 lb (101.2 kg)  Objective:    Vital Signs:  BP (!) 145/67   Pulse 69   Ht 6' (1.829 m)   Wt 221 lb (100.2 kg)   SpO2 95%   BMI 29.97 kg/m    VITAL SIGNS:  reviewed  ASSESSMENT & PLAN:    1.  Coronary artery disease:S/pPCI of the RCA in 2014 and recent cardiac catheterization earlier this month showed multivessel CAD with new 85% mid LAD stenosis, 10% ostial 10% proximal and 10% AV groove circumflex stenoses with 80% stenosis in a distal branch of an OM 2 vessel. Had a patent RCA stent with intimal hyperplasia of approximately 20%. He underwent successful PCI of the mid LAD with ultimate insertion of a 2.5 x 22 mm Resolute Onyx DES.  He continues to experience episodic exertional chest pain and dyspnea.  It is possible that this is due to small vessel disease as he had 85% distal OM2 stenosis with medical management recommended at that time.  I will increase Imdur to 60 mg.  I will also switch Brilinta to Plavix.  The day after his last dose of Brilinta I will have him take 300 mg of clopidogrel followed by 75 mg daily. He asked about decreasing the nadolol back to 80 mg which I agreed with.  It had been increased to 120 mg by Dr. Rayann Heman prior to cardiac catheterization.Continue aspirin and rosuvastatin.  2.  Hypertension: Blood pressure is mildly elevated.  I will monitor given increase of Imdur to 60 mg.  3.  Hyperlipidemia: Continue rosuvastatin 20 mg.  LDL 54 on 05/08/2018.  4.  Complete heart block: S/p PPM Placement in 12/2012 with Medtronic CRT-P placement in 03/2014.  Normal device function.  He has paroxysms of nonsustained ventricular tachycardia for which he takes nadolol.  They asked about reducing the dose to 80 mg.  It was increased to  120 mg by Dr. Rayann Heman prior to cardiac catheterization.  I think this would be fine.    COVID-19 Education: The signs and symptoms of COVID-19 were discussed with the patient and how to seek care for testing (follow up with PCP or arrange E-visit).  The importance of social distancing was discussed today.  Time:   Today, I have spent 25 minutes with the patient with telehealth technology discussing the above problems.     Medication Adjustments/Labs and Tests Ordered: Current medicines are reviewed at length with the patient today.  Concerns regarding medicines are outlined above.   Tests Ordered: No orders of the defined types were placed in this encounter.   Medication Changes: No orders of the defined types were placed in this encounter.   Disposition:  Follow up in 3 month(s)  Signed, Kate Sable, MD  09/23/2018 10:21 AM    [place]

## 2018-09-23 NOTE — Addendum Note (Signed)
Addended by: Barbarann Ehlers A on: 09/23/2018 10:44 AM   Modules accepted: Orders

## 2018-09-23 NOTE — Telephone Encounter (Signed)
Called patient to follow up on his referral to Phase II Cardiac Rehab s/p stent placement. He says he is doing well. He is walking 4 miles everyday without difficulty and feels like his heart is strong and that he does not need the program. He says he has discussed this with Dr. Bronson Ing and he is in agreement. No follow up.

## 2018-09-23 NOTE — Patient Instructions (Signed)
Medication Instructions: The day after you STOP Brilinta, take Plavix 300 mg ( 4 tablets) one time, THEN take 75 mg daily thereafter   DECREASE Nadolol to 80 mg daily   INCREASE Imdur to 60 mg daily  Labwork: none  Procedures/Testing: none  Follow-Up: 3 months with [place] PA-C  Any Additional Special Instructions Will Be Listed Below (If Applicable).     If you need a refill on your cardiac medications before your next appointment, please call your pharmacy.       Thank you for choosing [facility] !

## 2018-09-28 ENCOUNTER — Other Ambulatory Visit: Payer: Self-pay | Admitting: Family Medicine

## 2018-10-01 NOTE — Telephone Encounter (Signed)
I am uncertain of the validity of the request If the patient does need this we can send this in as a refill with 1 additional refill If he is having significant breathing issues I do recommend a follow-up visit even virtual would be fine

## 2018-10-01 NOTE — Telephone Encounter (Signed)
Called pt and he states he did not request this med.

## 2018-10-01 NOTE — Telephone Encounter (Signed)
Please definitely tell the pharmacy he is not requesting this medicine

## 2018-10-22 DIAGNOSIS — F4312 Post-traumatic stress disorder, chronic: Secondary | ICD-10-CM | POA: Diagnosis not present

## 2018-11-15 ENCOUNTER — Other Ambulatory Visit: Payer: Self-pay | Admitting: Family Medicine

## 2018-11-18 ENCOUNTER — Other Ambulatory Visit: Payer: Self-pay

## 2018-11-18 ENCOUNTER — Ambulatory Visit (INDEPENDENT_AMBULATORY_CARE_PROVIDER_SITE_OTHER): Payer: BC Managed Care – PPO | Admitting: Family Medicine

## 2018-11-18 DIAGNOSIS — M5432 Sciatica, left side: Secondary | ICD-10-CM | POA: Diagnosis not present

## 2018-11-18 MED ORDER — HYDROMORPHONE HCL 4 MG PO TABS: 4.0000 mg | ORAL_TABLET | Freq: Three times a day (TID) | ORAL | 0 refills | Status: DC | PRN

## 2018-11-18 MED ORDER — GABAPENTIN 100 MG PO CAPS: 100.0000 mg | ORAL_CAPSULE | Freq: Three times a day (TID) | ORAL | 2 refills | Status: DC

## 2018-11-18 MED ORDER — LIDOCAINE 5 % EX PTCH: MEDICATED_PATCH | CUTANEOUS | 2 refills | Status: DC

## 2018-11-18 MED ORDER — PROMETHAZINE HCL 25 MG PO TABS: ORAL_TABLET | ORAL | 2 refills | Status: DC

## 2018-11-18 NOTE — Progress Notes (Signed)
Subjective:    Patient ID: [name], male    DOB: Sep 15, 1955, 63 y.o.   MRN: [id number]  HPIlow back pain radiates down left leg. This has been going on for a long time. Wanted to discuss meds for pain. Having some side effects from oxycodone.  The patient relates oxycodone causes nausea.  He denies any fever or chills with it.  States overall energy level doing fairly well.  Patient relates that when he had morphine years ago where they injected it burned the vein but he denied any breaking out in a rash or hives so therefore I do not feel he is allergic to morphine  The back specialist recommended an injection but the patient is put this off because he is worried about COVID He denies weakness in the leg but relates intense pain burning and discomfort along with numbness down in the foot denies foot drop.  He only uses the oxycodone very sparingly intermittently Virtual Visit via Telephone Note  I connected with [name] on 11/18/18 at 11:00 AM EDT by telephone and verified that I am speaking with the correct person using two identifiers.  Location: Patient: home Provider: office   I discussed the limitations, risks, security and privacy concerns of performing an evaluation and management service by telephone and the availability of in person appointments. I also discussed with the patient that there may be a patient responsible charge related to this service. The patient expressed understanding and agreed to proceed.   History of Present Illness:    Observations/Objective:   Assessment and Plan:   Follow Up Instructions:    I discussed the assessment and treatment plan with the patient. The patient was provided an opportunity to ask questions and all were answered. The patient agreed with the plan and demonstrated an understanding of the instructions.   The patient was advised to call back or seek an in-person evaluation if the symptoms worsen or if the  condition fails to improve as anticipated.  I provided 15 minutes of non-face-to-face time during this encounter.       Review of Systems  Constitutional: Negative for activity change, fatigue and fever.  HENT: Negative for congestion and rhinorrhea.   Respiratory: Negative for cough and shortness of breath.   Cardiovascular: Negative for chest pain and leg swelling.  Gastrointestinal: Negative for abdominal pain, diarrhea and nausea.  Genitourinary: Negative for dysuria and hematuria.  Musculoskeletal: Positive for back pain. Negative for myalgias.  Neurological: Negative for weakness and headaches.  Psychiatric/Behavioral: Negative for agitation and behavioral problems.  See above     Objective:   Physical Exam   Patient had virtual visit Appears to be in no distress Atraumatic Neuro able to relate and oriented No apparent resp distress Color normal  He denies any foot drop    Assessment & Plan:  Severe sciatica I recommend he connect with his back specialist to go forward with the injection  I also recommend trying Dilaudid 4 mg tablet The first time he takes it he will utilize a half a tablet if it causes any side effects he will let us know if that does not adequately alleviate his pain he will use a full tablet I recommend this to be only in the evening and not with alcohol and also no driving with it  Patient will give [place] an update within 2 to 3 weeks how he is doing  Gabapentin gradually titrate up to 100 mg 3 times daily  Lidocaine patch on the area that bothers him most near the left hip may use apply for up to 12 hours/day

## 2018-11-20 ENCOUNTER — Telehealth: Payer: Self-pay | Admitting: *Deleted

## 2018-11-20 NOTE — Telephone Encounter (Signed)
Patient's insurance plan does not cover lidocaine patches per pharmacy. Please advise

## 2018-11-21 NOTE — Telephone Encounter (Signed)
Please let the patient know that his plan does not cover lidocaine patches unfortunately there is not much I can do about that

## 2018-11-22 NOTE — Telephone Encounter (Signed)
Patient notified and will try the OTC Lidocaine patches and see if they help and go from there

## 2018-11-26 ENCOUNTER — Other Ambulatory Visit: Payer: Self-pay | Admitting: Family Medicine

## 2018-11-26 ENCOUNTER — Encounter: Payer: Self-pay | Admitting: Family Medicine

## 2018-11-26 MED ORDER — HYDROMORPHONE HCL 4 MG PO TABS: 4.0000 mg | ORAL_TABLET | Freq: Four times a day (QID) | ORAL | 0 refills | Status: DC | PRN

## 2018-11-26 NOTE — Telephone Encounter (Signed)
Called [facility] and they do have rx on file but do not have med. Can order but will not get here until Thursday. Discussed with pt and pt states he would like to fill at another local pharm if he could. I called [facility] and spoke with Tripp and he states he does have it in stock and would fill it for him if dr scott sent in script. Also states pharm does not like to just fill pain meds for pt. Would like to take over maintence meds as well. Explained to him it would just be this one time since [facility] is out and he was ok with that. I told pt I would let him know what dr scott decided on.

## 2018-12-02 DIAGNOSIS — F4312 Post-traumatic stress disorder, chronic: Secondary | ICD-10-CM | POA: Diagnosis not present

## 2018-12-11 ENCOUNTER — Ambulatory Visit (INDEPENDENT_AMBULATORY_CARE_PROVIDER_SITE_OTHER): Payer: BC Managed Care – PPO | Admitting: *Deleted

## 2018-12-11 DIAGNOSIS — I441 Atrioventricular block, second degree: Secondary | ICD-10-CM

## 2018-12-12 LAB — CUP PACEART REMOTE DEVICE CHECK
Date Time Interrogation Session: 20200827075818
Implantable Lead Implant Date: 20151217
Implantable Lead Implant Date: 20151217
Implantable Lead Implant Date: 20151217
Implantable Lead Location: 753858
Implantable Lead Location: 753859
Implantable Lead Location: 753860
Implantable Lead Model: 4196
Implantable Lead Model: 5076
Implantable Lead Model: 5076
Implantable Pulse Generator Implant Date: 20151217

## 2018-12-15 ENCOUNTER — Other Ambulatory Visit: Payer: Self-pay | Admitting: Family Medicine

## 2018-12-17 NOTE — Telephone Encounter (Signed)
May have each with 2 refills needs virtual follow-up visit in October

## 2018-12-20 ENCOUNTER — Encounter: Payer: Self-pay | Admitting: Cardiology

## 2018-12-20 NOTE — Progress Notes (Signed)
Remote pacemaker transmission.   

## 2019-01-06 DIAGNOSIS — F4312 Post-traumatic stress disorder, chronic: Secondary | ICD-10-CM | POA: Diagnosis not present

## 2019-01-22 ENCOUNTER — Encounter: Payer: Self-pay | Admitting: Family Medicine

## 2019-01-22 ENCOUNTER — Other Ambulatory Visit: Payer: Self-pay | Admitting: Family Medicine

## 2019-01-22 MED ORDER — ALPRAZOLAM 0.5 MG PO TABS: 0.5000 mg | ORAL_TABLET | Freq: Two times a day (BID) | ORAL | 1 refills | Status: DC | PRN

## 2019-01-22 MED ORDER — HYDROMORPHONE HCL 4 MG PO TABS: 4.0000 mg | ORAL_TABLET | Freq: Four times a day (QID) | ORAL | 0 refills | Status: DC | PRN

## 2019-02-03 DIAGNOSIS — L570 Actinic keratosis: Secondary | ICD-10-CM | POA: Diagnosis not present

## 2019-02-03 DIAGNOSIS — L821 Other seborrheic keratosis: Secondary | ICD-10-CM | POA: Diagnosis not present

## 2019-02-03 DIAGNOSIS — D225 Melanocytic nevi of trunk: Secondary | ICD-10-CM | POA: Diagnosis not present

## 2019-02-11 ENCOUNTER — Other Ambulatory Visit: Payer: Self-pay | Admitting: Family Medicine

## 2019-02-11 ENCOUNTER — Other Ambulatory Visit: Payer: Self-pay | Admitting: Cardiovascular Disease

## 2019-02-11 DIAGNOSIS — M5136 Other intervertebral disc degeneration, lumbar region: Secondary | ICD-10-CM | POA: Diagnosis not present

## 2019-02-13 DIAGNOSIS — F4312 Post-traumatic stress disorder, chronic: Secondary | ICD-10-CM | POA: Diagnosis not present

## 2019-02-17 ENCOUNTER — Ambulatory Visit (INDEPENDENT_AMBULATORY_CARE_PROVIDER_SITE_OTHER): Payer: BC Managed Care – PPO | Admitting: Family Medicine

## 2019-02-17 ENCOUNTER — Other Ambulatory Visit: Payer: Self-pay

## 2019-02-17 VITALS — Temp 97.8°F | Wt 236.4 lb

## 2019-02-17 DIAGNOSIS — M25552 Pain in left hip: Secondary | ICD-10-CM

## 2019-02-17 DIAGNOSIS — M79605 Pain in left leg: Secondary | ICD-10-CM

## 2019-02-17 DIAGNOSIS — M25562 Pain in left knee: Secondary | ICD-10-CM | POA: Diagnosis not present

## 2019-02-17 MED ORDER — GABAPENTIN 100 MG PO CAPS: ORAL_CAPSULE | ORAL | 3 refills | Status: DC

## 2019-02-17 NOTE — Progress Notes (Signed)
   Subjective:    Patient ID: [name], male    DOB: Feb 05, 1956, 63 y.o.   MRN: [id number]  HPI  Patient arrives to discuss left leg pain for 2.5 years. Patient states he had an injection in his back and it did not help at all. Patient does use gabapentin it does seem to help some he is on a low-dose He does have some intermittent swelling in the ankles He denies any type of weakness.  He relates a burning aching pain discomfort deep into the leg on the left side also in the left hip he is concerned about the possibility of bursitis also concerned about the possibility of ankle pain related into all of this denies any other particular troubles uses the pain medication very infrequently and states he prefers not to be on any pain medicine currently  Review of Systems  Constitutional: Negative for activity change, fatigue and fever.  HENT: Negative for congestion and rhinorrhea.   Respiratory: Negative for cough and shortness of breath.   Cardiovascular: Negative for chest pain and leg swelling.  Gastrointestinal: Negative for abdominal pain, diarrhea and nausea.  Genitourinary: Negative for dysuria and hematuria.  Musculoskeletal: Positive for back pain. Negative for gait problem.  Neurological: Negative for weakness and headaches.  Psychiatric/Behavioral: Negative for agitation and behavioral problems.       Objective:   Physical Exam Vitals signs reviewed.  Cardiovascular:     Rate and Rhythm: Normal rate and regular rhythm.     Heart sounds: Normal heart sounds. No murmur.  Pulmonary:     Effort: Pulmonary effort is normal.     Breath sounds: Normal breath sounds.  Lymphadenopathy:     Cervical: No cervical adenopathy.  Neurological:     Mental Status: He is alert.  Psychiatric:        Behavior: Behavior normal.    Negative straight leg raise bilateral Mild tenderness in the left trochanteric region of the hip Strength is good overall       Assessment & Plan:   This patient is having left leg pain that is been going on for 2 years it was felt that it was possibly due to an impingement of the nerve but his CT scan showed more spondylosis Patient does have fibroma growing on the nerve but his back specialist did not feel it was causing his trouble Gabapentin helps a little bit but there is room to bump up the dose We will go ahead and bump up the dose to 300 mg twice daily if it causes significant swelling he will let us know  Patient does have neurosurgeon Dr. Ellene Route who is tried 2 injections  Referral to orthopedics for further evaluation.  I believe the patient would benefit from evaluation from orthopedics to see if this is hip or knee problems causing this  Also potentially they could help set him up with Dr.Ramos/ Rolena Infante if they feel it is more likely the back causing this

## 2019-02-25 ENCOUNTER — Encounter: Payer: Self-pay | Admitting: Family Medicine

## 2019-03-11 ENCOUNTER — Other Ambulatory Visit: Payer: Self-pay | Admitting: Family Medicine

## 2019-03-11 MED ORDER — HYDROMORPHONE HCL 4 MG PO TABS: 4.0000 mg | ORAL_TABLET | Freq: Four times a day (QID) | ORAL | 0 refills | Status: DC | PRN

## 2019-03-12 ENCOUNTER — Ambulatory Visit (INDEPENDENT_AMBULATORY_CARE_PROVIDER_SITE_OTHER): Payer: BC Managed Care – PPO | Admitting: *Deleted

## 2019-03-12 DIAGNOSIS — R001 Bradycardia, unspecified: Secondary | ICD-10-CM

## 2019-03-15 LAB — CUP PACEART REMOTE DEVICE CHECK
Date Time Interrogation Session: 20201127064654
Implantable Lead Implant Date: 20151217
Implantable Lead Implant Date: 20151217
Implantable Lead Implant Date: 20151217
Implantable Lead Location: 753858
Implantable Lead Location: 753859
Implantable Lead Location: 753860
Implantable Lead Model: 4196
Implantable Lead Model: 5076
Implantable Lead Model: 5076
Implantable Pulse Generator Implant Date: 20151217

## 2019-03-17 ENCOUNTER — Telehealth: Payer: Self-pay | Admitting: Physician Assistant

## 2019-03-17 ENCOUNTER — Other Ambulatory Visit: Payer: Self-pay | Admitting: *Deleted

## 2019-03-17 MED ORDER — NADOLOL 80 MG PO TABS: 80.0000 mg | ORAL_TABLET | Freq: Every day | ORAL | 1 refills | Status: DC

## 2019-03-17 NOTE — Telephone Encounter (Signed)

## 2019-03-19 ENCOUNTER — Telehealth (INDEPENDENT_AMBULATORY_CARE_PROVIDER_SITE_OTHER): Payer: BC Managed Care – PPO | Admitting: Physician Assistant

## 2019-03-19 VITALS — BP 134/74 | HR 60 | Ht 72.0 in | Wt 225.0 lb

## 2019-03-19 DIAGNOSIS — I10 Essential (primary) hypertension: Secondary | ICD-10-CM | POA: Diagnosis not present

## 2019-03-19 DIAGNOSIS — I442 Atrioventricular block, complete: Secondary | ICD-10-CM | POA: Diagnosis not present

## 2019-03-19 DIAGNOSIS — E782 Mixed hyperlipidemia: Secondary | ICD-10-CM | POA: Diagnosis not present

## 2019-03-19 DIAGNOSIS — I2510 Atherosclerotic heart disease of native coronary artery without angina pectoris: Secondary | ICD-10-CM | POA: Diagnosis not present

## 2019-03-19 DIAGNOSIS — I4729 Other ventricular tachycardia: Secondary | ICD-10-CM

## 2019-03-19 DIAGNOSIS — I472 Ventricular tachycardia: Secondary | ICD-10-CM

## 2019-03-19 NOTE — Progress Notes (Signed)
Virtual Visit via Video Note   This visit type was conducted due to national recommendations for restrictions regarding the COVID-19 Pandemic (e.g. social distancing) in an effort to limit this patient's exposure and mitigate transmission in our community.  Due to his co-morbid illnesses, this patient is at least at moderate risk for complications without adequate follow up.  This format is felt to be most appropriate for this patient at this time.  All issues noted in this document were discussed and addressed.  A limited physical exam was performed with this format.  Please refer to the patient's chart for his consent to telehealth for [facility].   Date:  03/19/2019   ID:  [name], DOB 20-Dec-1955, MRN [id number]  Patient Location: Home Provider Location: Home  PCP:  Kathyrn Drown, MD  Cardiologist:  Kate Sable, MD  Electrophysiologist:  Thompson Grayer, MD   Evaluation Performed:  Follow-Up Visit  Chief Complaint:  Follow up  History of Present Illness:    [name] is a 63 y.o. male with   history of CAD PCI of the RCA 2014, status post DES to the mid LAD 08/22/2018 with residual disease, pacemaker 2015 for complete heart block, hypertension, HLD, GERD.  Last office visit with Dr. Bronson Ing 09/23/2018 he was walking 4 miles 1 day but could only walk 1/2 mile the next.  He was having some exertional chest pain and dyspnea.  It was felt he could be having chest pain due to small vessel disease as he had an 85% distal OM 2 stenosis.  Imdur was increased to 60 mg daily and Brilinta was changed to Plavix and nadolol decreased to 80 mg daily.  Patient is doing much better since above changes Can walk 2 1/2 mile to work without difficulty and sometimes walks home. Watching salt and swelling has gone down. Loses breath when he bends over but has a diaphragm issue.  Motivated to lose weight.  Does not think he believes it is much on Plavix as he did with Brilinta.  The  patient does not have symptoms concerning for COVID-19 infection (fever, chills, cough, or new shortness of breath).    Past Medical History:  Diagnosis Date  . ADD (attention deficit disorder)    Rx-Adderall  . Anginal pain ([place])   . AV block, 3rd degree (HCC)   . Colon polyps 2011   tubular adenoma by excisional biopsy during colonoscopy  . Complete heart block [facility])    a. s/p PPM Placement in 12/2012 with Medtronic CRT-P placement in 03/2014  . Coronary artery disease    a. s/p PCI of the RCA in 2014 b. 08/2018: patent RCA stent with new mid-LAD 85% stenosis (treated with PCI/DES) and 85% distal OM2 stenosis (med management recommended)  . Degenerative disc disease, cervical   . Gastrointestinal bleeding 10/15/2017  . GERD (gastroesophageal reflux disease)   . Hypertension   . Pacemaker   . Palpitations 2003   Holter in 2003: PACs and PVCs; negative stress nuclear test in 2005; right bundle branch block; echo in 2008-mild LVH, otherwise normal; 2008-negative stress nuclear test.  . Pneumonia   . Prostatitis    prostate calcifications by CT  . Sleep apnea    questionable diagnosis   Past Surgical History:  Procedure Laterality Date  . ANTERIOR CERVICAL DECOMP/DISCECTOMY FUSION    . BACK SURGERY    . Biventricular pacemaker upgrade/ system revision  04/02/14   removal of previous atrial and ventricular leads  with placement of a new MDT Consulta CRT-P system at [facility] by Dr Violet Baldy  . COLONOSCOPY    . COLONOSCOPY W/ POLYPECTOMY  2011   tubular adenoma  . CORONARY STENT INTERVENTION N/A 08/22/2018   Procedure: CORONARY STENT INTERVENTION;  Surgeon: Troy Sine, MD;  Location: [place] CV LAB;  Service: Cardiovascular;  Laterality: N/A;  . ESOPHAGOGASTRODUODENOSCOPY     Gastritis  . INSERT / REPLACE / REMOVE PACEMAKER  12/17/2012   MDT Adapta L implanted by Dr Rayann Heman for mobitz II second degree AV block  . KNEE SURGERY Right   . LEFT HEART CATH AND  CORONARY ANGIOGRAPHY N/A 08/22/2018   Procedure: LEFT HEART CATH AND CORONARY ANGIOGRAPHY;  Surgeon: Troy Sine, MD;  Location: [place] CV LAB;  Service: Cardiovascular;  Laterality: N/A;  . LEFT HEART CATHETERIZATION WITH CORONARY ANGIOGRAM N/A 10/17/2012   Procedure: LEFT HEART CATHETERIZATION WITH CORONARY ANGIOGRAM;  Surgeon: Josue Hector, MD;  Location: [facility] CATH LAB;  Service: Cardiovascular;  Laterality: N/A;  . LEFT HEART CATHETERIZATION WITH CORONARY ANGIOGRAM N/A 12/17/2012   Procedure: LEFT HEART CATHETERIZATION WITH CORONARY ANGIOGRAM;  Surgeon: Peter M Martinique, MD;  Location: [facility] CATH LAB;  Service: Cardiovascular;  Laterality: N/A;  . LEFT HEART CATHETERIZATION WITH CORONARY ANGIOGRAM N/A 06/11/2013   Procedure: LEFT HEART CATHETERIZATION WITH CORONARY ANGIOGRAM;  Surgeon: Wellington Hampshire, MD;  Location: [place] CATH LAB;  Service: Cardiovascular;  Laterality: N/A;  . PERCUTANEOUS CORONARY STENT INTERVENTION (PCI-S)  10/17/2012   Procedure: PERCUTANEOUS CORONARY STENT INTERVENTION (PCI-S);  Surgeon: Josue Hector, MD;  Location: [facility] CATH LAB;  Service: Cardiovascular;;  . PERMANENT PACEMAKER INSERTION N/A 12/17/2012   Procedure: PERMANENT PACEMAKER INSERTION;  Surgeon: Thompson Grayer, MD;  Location: [facility] CATH LAB;  Service: Cardiovascular;  Laterality: N/A;  . POLYPECTOMY    . UPPER GASTROINTESTINAL ENDOSCOPY       Current Meds  Medication Sig  . ALPRAZolam (XANAX) 0.5 MG tablet Take 1 tablet (0.5 mg total) by mouth 2 (two) times daily as needed for anxiety or sleep.  Marland Kitchen amLODipine (NORVASC) 2.5 MG tablet Take 1 tablet (2.5 mg total) by mouth daily.  Marland Kitchen aspirin 81 MG chewable tablet Chew 81 mg by mouth daily.  . clopidogrel (PLAVIX) 75 MG tablet The day after you stop Brilinta, Take Plavix 300 mg ( 4 tablets) one time, then take 75 mg daily thereafter  . cyclobenzaprine (FLEXERIL) 5 MG tablet Take 5 mg by mouth at bedtime as needed for muscle spasms.  Marland Kitchen esomeprazole (NEXIUM) 40 MG capsule TAKE  1 CAPSULE BY MOUTH EVERY DAY  . gabapentin (NEURONTIN) 100 MG capsule 3 po bid  . hydrALAZINE (APRESOLINE) 50 MG tablet TAKE 1 TABLET(50 MG) BY MOUTH TWICE DAILY  . HYDROmorphone (DILAUDID) 4 MG tablet Take 1 tablet (4 mg total) by mouth every 6 (six) hours as needed for severe pain.  . isosorbide mononitrate (IMDUR) 60 MG 24 hr tablet Take 1 tablet (60 mg total) by mouth daily.  Marland Kitchen losartan (COZAAR) 100 MG tablet Take 1 tablet (100 mg total) by mouth daily.  . nadolol (CORGARD) 80 MG tablet Take 1 tablet (80 mg total) by mouth daily.  . nitroGLYCERIN (NITROSTAT) 0.4 MG SL tablet Place 1 tablet (0.4 mg total) under the tongue every 5 (five) minutes as needed for chest pain (MAX 3 TABLETS).  . promethazine (PHENERGAN) 25 MG tablet Take one tablet bid prn nausea  . rosuvastatin (CRESTOR) 20 MG tablet TAKE 1 TABLET BY MOUTH  DAILY  . tamsulosin (FLOMAX) 0.4 MG CAPS capsule TAKE 2 CAPSULES BY MOUTH EVERY DAY  . [DISCONTINUED] oxyCODONE-acetaminophen (PERCOCET) 10-325 MG tablet Take 1 tablet by mouth every 4 (four) hours as needed for pain.     Allergies:   Morphine and related, Lasix [furosemide], and Latex   Social History   Tobacco Use  . Smoking status: Former Smoker    Packs/day: 2.50    Years: 40.00    Pack years: 100.00    Types: Cigarettes    Start date: 04/18/1967    Quit date: 12/20/2003    Years since quitting: 15.2  . Smokeless tobacco: Former Network engineer Use Topics  . Alcohol use: Yes    Alcohol/week: 1.0 standard drinks    Types: 1 Cans of beer per week  . Drug use: Not Currently    Types: Marijuana    Comment: 30 + years ago - "crank, marjiuanna, ect."     Family Hx: The patient's family history includes Breast cancer in his maternal aunt; Heart disease in his mother; Hypertension in his mother; Prostate cancer in his brother. There is no history of Colon cancer or Colon polyps.  ROS:   Please see the history of present illness.     All other systems reviewed and  are negative.   Prior CV studies:   The following studies were reviewed today:  Cardiac Catheterization: 08/22/2018  Prox RCA to Mid RCA lesion is 20% stenosed.  Dist Cx lesion is 85% stenosed.  Prox Cx lesion is 10% stenosed.  Ost Cx lesion is 10% stenosed.  Mid LAD lesion is 85% stenosed.  Post intervention, there is a 0% residual stenosis.  The left ventricular systolic function is normal.  LV end diastolic pressure is normal.  A stent was successfully placed.   Preserved global LV function with an EF estimated at 60 to 65%.  LVEDP 15 mm.   Multivessel CAD with new 85% mid LAD stenosis; 10% ostial 10% proximal and 10% AV groove circumflex stenoses with 80% stenosis in a distal branch of an OM 2 vessel; patent RCA stent with intimal hyperplasia of approximately 20%.   Successful PCI of the mid LAD with ultimate insertion of a 2.5 x 22 mm Resolute Onyx DES stent postdilated to 2.6 mm with the 85% stenosis being reduced to 0%.   RECOMMENDATION: DAPT for minimum of 6 months but preferably longer.  Medical therapy for concomitant CAD.  Optimal blood pressure control with ideal blood pressure less than 120/80.  Aggressive lipid-lowering therapy with target LDL less than 70.     Labs/Other Tests and Data Reviewed:    EKG:  No ECG reviewed.  Recent Labs: 08/20/2018: BUN 16; Creatinine, Ser 0.92; Hemoglobin 14.2; Platelets 219; Potassium 4.3; Sodium 143   Recent Lipid Panel Lab Results  Component Value Date/Time   CHOL 119 05/08/2018 10:02 AM   TRIG 64 05/08/2018 10:02 AM   HDL 52 05/08/2018 10:02 AM   CHOLHDL 2.3 05/08/2018 10:02 AM   CHOLHDL 2.5 12/17/2012 02:15 AM   LDLCALC 54 05/08/2018 10:02 AM    Wt Readings from Last 3 Encounters:  03/19/19 225 lb (102.1 kg)  02/17/19 236 lb 6.4 oz (107.2 kg)  09/23/18 221 lb (100.2 kg)     Objective:    Vital Signs:  BP 134/74   Pulse 60   Ht 6' (1.829 m)   Wt 225 lb (102.1 kg)   SpO2 96%   BMI 30.52 kg/m  VITAL SIGNS:  reviewed GEN:  no acute distress RESPIRATORY:  normal respiratory effort, symmetric expansion CARDIOVASCULAR:  no peripheral edema  ASSESSMENT & PLAN:    1. CAD status post PCI to the RCA in 2014, DES to the LAD 08/2018 with residual 80% distal OM 2 vessel.  Recurrent exertional chest pain and shortness of breath treated with increasing Imdur and changing Brilinta to Plavix 09/2018-doing much better. Continue current treatment 2. Essential hypertension controlled 3. Hyperlipidemia LDL 54 04/2018 4. Complete heart block status post pacemaker and 2014 with Medtronic CRT-P placement in 2015.   5. NSVT treated with nadolol followed by Dr. Rayann Heman  COVID-19 Education: The signs and symptoms of COVID-19 were discussed with the patient and how to seek care for testing (follow up with PCP or arrange E-visit).   The importance of social distancing was discussed today.  Time:   Today, I have spent 17 minutes with the patient with telehealth technology discussing the above problems.     Medication Adjustments/Labs and Tests Ordered: Current medicines are reviewed at length with the patient today.  Concerns regarding medicines are outlined above.   Tests Ordered: No orders of the defined types were placed in this encounter.   Medication Changes: No orders of the defined types were placed in this encounter.   Follow Up:  Either In Person or Virtual in 6 month(s) Dr. Bronson Ing or Ermalinda Barrios, PA-C  Signed, Ermalinda Barrios, PA-C  03/19/2019 1:09 PM    [place]

## 2019-03-19 NOTE — Patient Instructions (Signed)
Medication Instructions:  Your physician recommends that you continue on your current medications as directed. Please refer to the Current Medication list given to you today.  *If you need a refill on your cardiac medications before your next appointment, please call your pharmacy*  Lab Work: None today If you have labs (blood work) drawn today and your tests are completely normal, you will receive your results only by: Marland Kitchen MyChart Message (if you have MyChart) OR . A paper copy in the mail If you have any lab test that is abnormal or we need to change your treatment, we will call you to review the results.  Testing/Procedures: None today  Follow-Up: At [facility], you and your health needs are our priority.  As part of our continuing mission to provide you with exceptional heart care, we have created designated Provider Care Teams.  These Care Teams include your primary Cardiologist (physician) and Advanced Practice Providers (APPs -  Physician Assistants and Nurse Practitioners) who all work together to provide you with the care you need, when you need it.  Your next appointment:   6 month(s)  The format for your next appointment:   Either In Person or Virtual  Provider:   You may see Kate Sable, MD or one of the following Advanced Practice Providers on your designated Care Team:      Ermalinda Barrios, PA-C    Other Instructions None     Thank you for choosing [name] !

## 2019-03-20 DIAGNOSIS — F4312 Post-traumatic stress disorder, chronic: Secondary | ICD-10-CM | POA: Diagnosis not present

## 2019-03-21 ENCOUNTER — Other Ambulatory Visit: Payer: Self-pay | Admitting: Family Medicine

## 2019-03-31 ENCOUNTER — Other Ambulatory Visit: Payer: Self-pay | Admitting: Student

## 2019-04-07 ENCOUNTER — Other Ambulatory Visit: Payer: Self-pay | Admitting: Family Medicine

## 2019-04-17 ENCOUNTER — Other Ambulatory Visit: Payer: Self-pay | Admitting: Family Medicine

## 2019-04-22 ENCOUNTER — Ambulatory Visit (INDEPENDENT_AMBULATORY_CARE_PROVIDER_SITE_OTHER): Payer: BC Managed Care – PPO | Admitting: Family Medicine

## 2019-04-22 ENCOUNTER — Other Ambulatory Visit: Payer: Self-pay

## 2019-04-22 VITALS — BP 134/84 | Temp 97.6°F | Ht 71.0 in | Wt 238.2 lb

## 2019-04-22 DIAGNOSIS — F411 Generalized anxiety disorder: Secondary | ICD-10-CM

## 2019-04-22 DIAGNOSIS — R0600 Dyspnea, unspecified: Secondary | ICD-10-CM

## 2019-04-22 DIAGNOSIS — F439 Reaction to severe stress, unspecified: Secondary | ICD-10-CM

## 2019-04-22 DIAGNOSIS — R0609 Other forms of dyspnea: Secondary | ICD-10-CM

## 2019-04-22 DIAGNOSIS — R079 Chest pain, unspecified: Secondary | ICD-10-CM | POA: Diagnosis not present

## 2019-04-22 MED ORDER — SERTRALINE HCL 50 MG PO TABS: 50.0000 mg | ORAL_TABLET | Freq: Every day | ORAL | 3 refills | Status: DC

## 2019-04-22 NOTE — Progress Notes (Signed)
Subjective:    Patient ID: [name], male    DOB: [birth year], 64 y.o.   MRN: [id number]  HPI  Patient arrives with chest pain and SOB for a while. Patient states his cardiologist told him that he had a completely blocked artery that nothing could be done about. This patient has been having some intermittent trouble with shortness of breath especially with activity he also gets intermittent chest pain he is being followed by cardiologist for this they have him on antianginal medicine one of the branches of his heart has a blockage that they told him there is really not much they can do about it but patient states at times he can walk a couple miles other times it is just a short distance denies PND during denies orthopnea no coughing no weight loss fever chills or sweats no recent Covid symptoms no swelling in the legs PMH benign Patient also states he has chronic back pain hip pain discomfort is always in pain pain medicine does not do a lot helps some He finds himself having lack of joy lack of energy feeling sad feeling negative about himself denies being in suicidal.  States he has good control of his emotions and denies being aggressive or homicidal Review of Systems  Constitutional: Negative for diaphoresis and fatigue.  HENT: Negative for congestion and rhinorrhea.   Respiratory: Positive for shortness of breath. Negative for cough.   Cardiovascular: Positive for chest pain. Negative for leg swelling.  Gastrointestinal: Negative for abdominal pain and diarrhea.  Skin: Negative for color change and rash.  Neurological: Negative for dizziness and headaches.  Psychiatric/Behavioral: Negative for behavioral problems and confusion.       Objective:   Physical Exam Vitals reviewed.  Constitutional:      General: He is not in acute distress. HENT:     Head: Normocephalic and atraumatic.  Eyes:     General:        Right eye: No discharge.        Left eye: No discharge.  Neck:      Trachea: No tracheal deviation.  Cardiovascular:     Rate and Rhythm: Normal rate and regular rhythm.     Heart sounds: Normal heart sounds. No murmur.  Pulmonary:     Effort: Pulmonary effort is normal. No respiratory distress.     Breath sounds: Normal breath sounds.  Lymphadenopathy:     Cervical: No cervical adenopathy.  Skin:    General: Skin is warm and dry.  Neurological:     Mental Status: He is alert.     Coordination: Coordination normal.  Psychiatric:        Behavior: Behavior normal.    EKG shows bundle branch block with pacer does not show any acute changes compared to previous EKG       Assessment & Plan:  I believe the patient does have some moderate depression patient not suicidal.  Patient is doing counseling. Also has generalized anxiety disorder Also has stress related issues Would benefit from sertraline half tablet daily for the first 7 days then 1 tablet daily thereafter Patient was counseled should he start feeling suicidal or worse to immediately reach out to [place] and get help Also follow-up again in 3 to 4 weeks virtually  Chest discomforts probably related to chronic angina is on antianginal medications.  Continue these currently.  I would not recommend further cardiac testing right at the moment Certainly if things get worse I would recommend getting  back in with cardiology  Shortness of breath probably related multifactorial to deconditioning, diaphragm related issues, and cardiac related issues.  Chronic pain and discomfort continue current measures.  Follow through with specialist for the left hip as well as injections for the back

## 2019-04-24 ENCOUNTER — Encounter: Payer: Self-pay | Admitting: Family Medicine

## 2019-04-28 ENCOUNTER — Encounter: Payer: Self-pay | Admitting: Family Medicine

## 2019-04-28 DIAGNOSIS — Z20828 Contact with and (suspected) exposure to other viral communicable diseases: Secondary | ICD-10-CM | POA: Diagnosis not present

## 2019-04-28 NOTE — Telephone Encounter (Signed)
Called pt and asked if he felt he needed appt today and he declined. He states he will do covid test today in [place]. Advised him to let us know when he gets results and call back if he is worse.

## 2019-04-30 ENCOUNTER — Other Ambulatory Visit: Payer: Self-pay

## 2019-04-30 ENCOUNTER — Ambulatory Visit (HOSPITAL_COMMUNITY)
Admission: RE | Admit: 2019-04-30 | Discharge: 2019-04-30 | Disposition: A | Discharge status: Home / Self Care | Payer: BC Managed Care – PPO | Source: Ambulatory Visit | Attending: Family Medicine | Admitting: Family Medicine

## 2019-04-30 ENCOUNTER — Ambulatory Visit (INDEPENDENT_AMBULATORY_CARE_PROVIDER_SITE_OTHER): Payer: BC Managed Care – PPO | Admitting: Family Medicine

## 2019-04-30 DIAGNOSIS — R0602 Shortness of breath: Secondary | ICD-10-CM | POA: Diagnosis not present

## 2019-04-30 DIAGNOSIS — R05 Cough: Secondary | ICD-10-CM | POA: Diagnosis not present

## 2019-04-30 DIAGNOSIS — J4521 Mild intermittent asthma with (acute) exacerbation: Secondary | ICD-10-CM

## 2019-04-30 DIAGNOSIS — J189 Pneumonia, unspecified organism: Secondary | ICD-10-CM

## 2019-04-30 MED ORDER — AZITHROMYCIN 250 MG PO TABS: ORAL_TABLET | ORAL | 0 refills | Status: DC

## 2019-04-30 MED ORDER — ALBUTEROL SULFATE HFA 108 (90 BASE) MCG/ACT IN AERS: 2.0000 | INHALATION_SPRAY | RESPIRATORY_TRACT | 2 refills | Status: DC | PRN

## 2019-04-30 MED ORDER — PREDNISONE 20 MG PO TABS: ORAL_TABLET | ORAL | 0 refills | Status: DC

## 2019-04-30 NOTE — Progress Notes (Signed)
   Subjective:    Patient ID: [name], male    DOB: [birth year], 64 y.o.   MRN: [id number] Initially virtual visit patient was brought to the office outside facility for safe examination Sinusitis This is a new problem. Episode onset: Saturday. Associated symptoms include congestion, coughing and shortness of breath. Pertinent negatives include no chills or ear pain. (Wheezing, "whistling", no fever, chest cold, tested negative for COVID(rapid and PCR test), started as head congestion, productive cough brownish green color) Treatments tried: Dayquil, Nyquil. The treatment provided mild relief.  Patient with significant head congestion drainage coughing also chest congestion wheezing not feeling good low-grade fever.  Has had 2 - Covid test.  Patient's energy level suppressed.  Patient gets short of breath with activity. Virtual Visit via Telephone Note  I connected with [name] on 04/30/19 at  3:00 PM EST by telephone and verified that I am speaking with the correct person using two identifiers.  Location: Patient: home Provider: office   I discussed the limitations, risks, security and privacy concerns of performing an evaluation and management service by telephone and the availability of in person appointments. I also discussed with the patient that there may be a patient responsible charge related to this service. The patient expressed understanding and agreed to proceed.   History of Present Illness:    Observations/Objective:   Assessment and Plan:   Follow Up Instructions:    I discussed the assessment and treatment plan with the patient. The patient was provided an opportunity to ask questions and all were answered. The patient agreed with the plan and demonstrated an understanding of the instructions.   The patient was advised to call back or seek an in-person evaluation if the symptoms worsen or if the condition fails to improve as anticipated.  I provided  16 minutes of non-face-to-face time during this encounter.     O2 saturation 92% typically at 97%   Review of Systems  Constitutional: Negative for activity change, chills and fever.  HENT: Positive for congestion and rhinorrhea. Negative for ear pain.   Eyes: Negative for discharge.  Respiratory: Positive for cough and shortness of breath. Negative for wheezing.   Cardiovascular: Negative for chest pain.  Gastrointestinal: Negative for nausea and vomiting.  Musculoskeletal: Negative for arthralgias.       Objective:   Physical Exam  Bilateral expiratory wheezes are noted along with chest congestion patient not tachypneic HEENT benign heart normal      Assessment & Plan:  I believe the patient has viral pneumonia we will need to do a chest x-ray need to cover for possibility of bacterial aspect because of the reactive airway also prednisone in addition to this albuterol 2 puffs every 4 hours if O2 saturations drop into the 80s immediately go to ER Chest x-ray did not show pneumonia patient was talked to regarding the results of this test Even though he has had 2 - Covid test cannot rule this out totally  I believe he has atypical pneumonia along with reactive airway disease proceed forward with treatments

## 2019-05-07 ENCOUNTER — Other Ambulatory Visit: Payer: Self-pay | Admitting: *Deleted

## 2019-05-07 MED ORDER — HYDRALAZINE HCL 50 MG PO TABS: ORAL_TABLET | ORAL | 6 refills | Status: DC

## 2019-05-12 ENCOUNTER — Telehealth: Payer: Self-pay | Admitting: Family Medicine

## 2019-05-12 NOTE — Telephone Encounter (Signed)
5 refills

## 2019-05-12 NOTE — Telephone Encounter (Signed)
Fax from [facility] on [address] requesting refill on Promethazine 25 mg tablets. Take one tablet po BID prn nausea. Pt last seen 04/30/19 for pneumonia. Please advise. Thank you.

## 2019-05-13 ENCOUNTER — Other Ambulatory Visit: Payer: Self-pay

## 2019-05-13 ENCOUNTER — Ambulatory Visit (INDEPENDENT_AMBULATORY_CARE_PROVIDER_SITE_OTHER): Payer: BC Managed Care – PPO | Admitting: Family Medicine

## 2019-05-13 DIAGNOSIS — J189 Pneumonia, unspecified organism: Secondary | ICD-10-CM | POA: Diagnosis not present

## 2019-05-13 DIAGNOSIS — F411 Generalized anxiety disorder: Secondary | ICD-10-CM

## 2019-05-13 MED ORDER — PROMETHAZINE HCL 25 MG PO TABS: ORAL_TABLET | ORAL | 5 refills | Status: DC

## 2019-05-13 MED ORDER — HYDROMORPHONE HCL 4 MG PO TABS: 4.0000 mg | ORAL_TABLET | Freq: Four times a day (QID) | ORAL | 0 refills | Status: DC | PRN

## 2019-05-13 NOTE — Telephone Encounter (Signed)
Refills sent to pharmacy. 

## 2019-05-13 NOTE — Addendum Note (Signed)
Addended by: Vicente Males on: 05/13/2019 08:39 AM   Modules accepted: Orders

## 2019-05-13 NOTE — Progress Notes (Signed)
   Subjective:    Patient ID: [name], male    DOB: Aug 02, 1955, 64 y.o.   MRN: [id number]  HPI  Patient calls for a follow up on pneumonia. Patient states he is feeling much better and doing well.  Patient states his breathing is doing better.  He denies high fever chills sweats He states energy level doing better.  Does not get overly short of breath with activity is back to his baseline.  Patient also relates sertraline initially cause significant nausea but now it is doing better less nausea moods are stable has not seen much improvement with the medicine but is only been on it less than 3 weeks  Virtual Visit via Video Note  I connected with Brenyn Patchett Meinhardt on 05/13/19 at  9:30 AM EST by a video enabled telemedicine application and verified that I am speaking with the correct person using two identifiers.  Location: Patient: home Provider: office   I discussed the limitations of evaluation and management by telemedicine and the availability of in person appointments. The patient expressed understanding and agreed to proceed.  History of Present Illness:    Observations/Objective:   Assessment and Plan:   Follow Up Instructions:    I discussed the assessment and treatment plan with the patient. The patient was provided an opportunity to ask questions and all were answered. The patient agreed with the plan and demonstrated an understanding of the instructions.   The patient was advised to call back or seek an in-person evaluation if the symptoms worsen or if the condition fails to improve as anticipated.  I provided 20 minutes of non-face-to-face time during this encounter.      Review of Systems     Objective:   Physical Exam  Patient had virtual visit Appears to be in no distress Atraumatic Neuro able to relate and oriented No apparent resp distress Color normal       Assessment & Plan:  Depression-patient is tolerating the sertraline had  nausea initially but now tolerating it when he gets to 4 to 6 weeks out on the medicine he will give me an update on how that medication is doing and we will decide if we need to bump it up to 100 mg  Respiratory recent atypical pneumonia-has residual cough uses albuterol infrequently occasional sweats at night but is improving as long as he is seeing improvement no need for further x-rays lab work medications etc. patient will keep [place] updated  Patient will give [place] an update in a few weeks time as stated above we can do either my chart messaging or virtual visit at that time  Follow-up in late spring  Patient will give [place] an update within a couple weeks time on the sertraline

## 2019-05-30 ENCOUNTER — Encounter: Payer: Self-pay | Admitting: Family Medicine

## 2019-05-30 DIAGNOSIS — I10 Essential (primary) hypertension: Secondary | ICD-10-CM

## 2019-05-30 DIAGNOSIS — E7849 Other hyperlipidemia: Secondary | ICD-10-CM

## 2019-05-30 DIAGNOSIS — Z125 Encounter for screening for malignant neoplasm of prostate: Secondary | ICD-10-CM

## 2019-05-31 NOTE — Telephone Encounter (Signed)
Nurses Go ahead with Zoloft 100 mg 1 daily, #30, 3 refills Also patient should do lipid, liver, metabolic 7, CBC, PSA Hyperlipidemia screening for prostate cancer, HTN  Please notify patient he can schedule a full physical in person at his convenience

## 2019-06-02 MED ORDER — SERTRALINE HCL 100 MG PO TABS: ORAL_TABLET | ORAL | 3 refills | Status: DC

## 2019-06-02 NOTE — Addendum Note (Signed)
Addended by: Vicente Males on: 06/02/2019 08:39 AM   Modules accepted: Orders

## 2019-06-10 DIAGNOSIS — E7849 Other hyperlipidemia: Secondary | ICD-10-CM | POA: Diagnosis not present

## 2019-06-10 DIAGNOSIS — F4312 Post-traumatic stress disorder, chronic: Secondary | ICD-10-CM | POA: Diagnosis not present

## 2019-06-10 DIAGNOSIS — I10 Essential (primary) hypertension: Secondary | ICD-10-CM | POA: Diagnosis not present

## 2019-06-10 DIAGNOSIS — Z125 Encounter for screening for malignant neoplasm of prostate: Secondary | ICD-10-CM | POA: Diagnosis not present

## 2019-06-11 ENCOUNTER — Ambulatory Visit (INDEPENDENT_AMBULATORY_CARE_PROVIDER_SITE_OTHER): Payer: BC Managed Care – PPO | Admitting: *Deleted

## 2019-06-11 ENCOUNTER — Other Ambulatory Visit: Payer: Self-pay

## 2019-06-11 ENCOUNTER — Ambulatory Visit (INDEPENDENT_AMBULATORY_CARE_PROVIDER_SITE_OTHER): Payer: BC Managed Care – PPO | Admitting: Family Medicine

## 2019-06-11 ENCOUNTER — Encounter: Payer: Self-pay | Admitting: Family Medicine

## 2019-06-11 VITALS — BP 110/70 | Temp 97.3°F | Wt 229.6 lb

## 2019-06-11 DIAGNOSIS — R42 Dizziness and giddiness: Secondary | ICD-10-CM | POA: Diagnosis not present

## 2019-06-11 DIAGNOSIS — R55 Syncope and collapse: Secondary | ICD-10-CM | POA: Diagnosis not present

## 2019-06-11 DIAGNOSIS — R3911 Hesitancy of micturition: Secondary | ICD-10-CM

## 2019-06-11 DIAGNOSIS — I951 Orthostatic hypotension: Secondary | ICD-10-CM | POA: Diagnosis not present

## 2019-06-11 DIAGNOSIS — N401 Enlarged prostate with lower urinary tract symptoms: Secondary | ICD-10-CM | POA: Diagnosis not present

## 2019-06-11 DIAGNOSIS — R001 Bradycardia, unspecified: Secondary | ICD-10-CM

## 2019-06-11 LAB — LIPID PANEL
Chol/HDL Ratio: 2.5 ratio (ref 0.0–5.0)
Cholesterol, Total: 131 mg/dL (ref 100–199)
HDL: 52 mg/dL (ref >39)
LDL Chol Calc (NIH): 57 mg/dL (ref 0–99)
Triglycerides: 125 mg/dL (ref 0–149)
VLDL Cholesterol Cal: 22 mg/dL (ref 5–40)

## 2019-06-11 LAB — HEPATIC FUNCTION PANEL
ALT: 20 IU/L (ref 0–44)
AST: 17 IU/L (ref 0–40)
Albumin: 4.9 g/dL — ABNORMAL HIGH (ref 3.8–4.8)
Alkaline Phosphatase: 75 IU/L (ref 39–117)
Bilirubin Total: 0.8 mg/dL (ref 0.0–1.2)
Bilirubin, Direct: 0.24 mg/dL (ref 0.00–0.40)
Total Protein: 6.8 g/dL (ref 6.0–8.5)

## 2019-06-11 LAB — CBC WITH DIFFERENTIAL/PLATELET
Basophils Absolute: 0 10*3/uL (ref 0.0–0.2)
Basos: 1 %
EOS (ABSOLUTE): 0.1 10*3/uL (ref 0.0–0.4)
Eos: 1 %
Hematocrit: 47.2 % (ref 37.5–51.0)
Hemoglobin: 15.7 g/dL (ref 13.0–17.7)
Immature Grans (Abs): 0 10*3/uL (ref 0.0–0.1)
Immature Granulocytes: 0 %
Lymphocytes Absolute: 1.7 10*3/uL (ref 0.7–3.1)
Lymphs: 21 %
MCH: 29.8 pg (ref 26.6–33.0)
MCHC: 33.3 g/dL (ref 31.5–35.7)
MCV: 90 fL (ref 79–97)
Monocytes Absolute: 0.6 10*3/uL (ref 0.1–0.9)
Monocytes: 7 %
Neutrophils Absolute: 5.7 10*3/uL (ref 1.4–7.0)
Neutrophils: 70 %
Platelets: 214 10*3/uL (ref 150–450)
RBC: 5.26 x10E6/uL (ref 4.14–5.80)
RDW: 12.5 % (ref 11.6–15.4)
WBC: 8.1 10*3/uL (ref 3.4–10.8)

## 2019-06-11 LAB — BASIC METABOLIC PANEL
BUN/Creatinine Ratio: 13 (ref 10–24)
BUN: 13 mg/dL (ref 8–27)
CO2: 25 mmol/L (ref 20–29)
Calcium: 9.7 mg/dL (ref 8.6–10.2)
Chloride: 104 mmol/L (ref 96–106)
Creatinine, Ser: 0.98 mg/dL (ref 0.76–1.27)
GFR calc Af Amer: 94 mL/min/{1.73_m2} (ref >59)
GFR calc non Af Amer: 81 mL/min/{1.73_m2} (ref >59)
Glucose: 147 mg/dL — ABNORMAL HIGH (ref 65–99)
Potassium: 4.1 mmol/L (ref 3.5–5.2)
Sodium: 144 mmol/L (ref 134–144)

## 2019-06-11 LAB — PSA: Prostate Specific Ag, Serum: 1.9 ng/mL (ref 0.0–4.0)

## 2019-06-11 MED ORDER — TAMSULOSIN HCL 0.4 MG PO CAPS: ORAL_CAPSULE | ORAL | 2 refills | Status: DC

## 2019-06-11 MED ORDER — HYDRALAZINE HCL 25 MG PO TABS: ORAL_TABLET | ORAL | 4 refills | Status: DC

## 2019-06-11 MED ORDER — AMLODIPINE BESYLATE 2.5 MG PO TABS: ORAL_TABLET | ORAL | 4 refills | Status: DC

## 2019-06-11 NOTE — Progress Notes (Signed)
Subjective:    Patient ID: [name], male    DOB: 1955/11/22, 64 y.o.   MRN: [id number]  Dizziness This is a new problem. Episode onset: 2-3 days. Pertinent negatives include no abdominal pain, chest pain, congestion, coughing, headaches, nausea, vomiting or weakness. Associated symptoms comments: Heart flutters, low blood pressure, weak.   Pt started taking a multivitamin and Zoloft 100 mg daily. Has increased exercise This patient states that he has noticed over the past several weeks he just has not felt good in finds himself feeling fatigued tired like he is out of energy he also states that when he stands up he sometimes gets dizzy and has had several times when he is walking where he felt lightheaded like he was going to pass out denies any true vertigo symptoms denies unilateral numbness or weakness no chest tightness pressure pain did state that he felt out of breath with activity but this is common for him he is on multiple medications for his heart and also on medication for BPH  Review of Systems  Constitutional: Negative for activity change.  HENT: Negative for congestion and rhinorrhea.   Respiratory: Negative for cough and shortness of breath.   Cardiovascular: Negative for chest pain.  Gastrointestinal: Negative for abdominal pain, diarrhea, nausea and vomiting.  Genitourinary: Negative for dysuria and hematuria.  Neurological: Positive for dizziness. Negative for weakness and headaches.  Psychiatric/Behavioral: Negative for behavioral problems and confusion.       Objective:   Physical Exam Vitals reviewed.  Constitutional:      General: He is not in acute distress. HENT:     Head: Normocephalic and atraumatic.  Eyes:     General:        Right eye: No discharge.        Left eye: No discharge.  Neck:     Trachea: No tracheal deviation.  Cardiovascular:     Rate and Rhythm: Normal rate and regular rhythm.     Heart sounds: Normal heart sounds. No murmur.   Pulmonary:     Effort: Pulmonary effort is normal. No respiratory distress.     Breath sounds: Normal breath sounds.  Lymphadenopathy:     Cervical: No cervical adenopathy.  Skin:    General: Skin is warm and dry.  Neurological:     Mental Status: He is alert.     Coordination: Coordination normal.  Psychiatric:        Behavior: Behavior normal.    EKG shows pacer does not show any acute ST segment changes no worrisome rhythms this is very comparable to the previous EKG from January 2021  Results for orders placed or performed in visit on 05/30/19  Lipid Profile  Result Value Ref Range   Cholesterol, Total 131 100 - 199 mg/dL   Triglycerides 125 0 - 149 mg/dL   HDL 52 >39 mg/dL   VLDL Cholesterol Cal 22 5 - 40 mg/dL   LDL Chol Calc (NIH) 57 0 - 99 mg/dL   Chol/HDL Ratio 2.5 0.0 - 5.0 ratio  Hepatic function panel  Result Value Ref Range   Total Protein 6.8 6.0 - 8.5 g/dL   Albumin 4.9 (H) 3.8 - 4.8 g/dL   Bilirubin Total 0.8 0.0 - 1.2 mg/dL   Bilirubin, Direct 0.24 0.00 - 0.40 mg/dL   Alkaline Phosphatase 75 39 - 117 IU/L   AST 17 0 - 40 IU/L   ALT 20 0 - 44 IU/L  Basic Metabolic Panel (BMET)  Result  Value Ref Range   Glucose 147 (H) 65 - 99 mg/dL   BUN 13 8 - 27 mg/dL   Creatinine, Ser 0.98 0.76 - 1.27 mg/dL   GFR calc non Af Amer 81 >59 mL/min/1.73   GFR calc Af Amer 94 >59 mL/min/1.73   BUN/Creatinine Ratio 13 10 - 24   Sodium 144 134 - 144 mmol/L   Potassium 4.1 3.5 - 5.2 mmol/L   Chloride 104 96 - 106 mmol/L   CO2 25 20 - 29 mmol/L   Calcium 9.7 8.6 - 10.2 mg/dL  CBC with Differential  Result Value Ref Range   WBC 8.1 3.4 - 10.8 x10E3/uL   RBC 5.26 4.14 - 5.80 x10E6/uL   Hemoglobin 15.7 13.0 - 17.7 g/dL   Hematocrit 47.2 37.5 - 51.0 %   MCV 90 79 - 97 fL   MCH 29.8 26.6 - 33.0 pg   MCHC 33.3 31.5 - 35.7 g/dL   RDW 12.5 11.6 - 15.4 %   Platelets 214 150 - 450 x10E3/uL   Neutrophils 70 Not Estab. %   Lymphs 21 Not Estab. %   Monocytes 7 Not Estab. %    Eos 1 Not Estab. %   Basos 1 Not Estab. %   Neutrophils Absolute 5.7 1.4 - 7.0 x10E3/uL   Lymphocytes Absolute 1.7 0.7 - 3.1 x10E3/uL   Monocytes Absolute 0.6 0.1 - 0.9 x10E3/uL   EOS (ABSOLUTE) 0.1 0.0 - 0.4 x10E3/uL   Basophils Absolute 0.0 0.0 - 0.2 x10E3/uL   Immature Granulocytes 0 Not Estab. %   Immature Grans (Abs) 0.0 0.0 - 0.1 x10E3/uL  PSA  Result Value Ref Range   Prostate Specific Ag, Serum 1.9 0.0 - 4.0 ng/mL   Blood pressure lying down 114/72 sitting 96/70 standing 90/66    Assessment & Plan:  Fatigue lightheadedness I believe this is related to low blood pressures.  Patient had recent lab work this was reviewed with the patient  Orthostatic hypotension currently medication I believe is too strong for him reduce the hydralazine to 25 mg twice daily Reduce amlodipine new dose 2.5 mg Reduce Flomax from 2 capsules each evening to 1 capsule each evening Patient should give [place] a follow-up within 7 to 14 days how he is doing We will do a wellness exam in a few weeks time and recheck orthostatics Certainly I believe if we leave medicines as is he runs a risk of having falls with injury.  He will monitor blood pressure closely and notify us. We will send notification to his cardiology team of these changes  We also checked his blood pressure with his cuff and with our cuff his cuff overestimate systolic by approximately 8 points

## 2019-06-11 NOTE — Patient Instructions (Signed)
Amlodipine 2.5 one day hydralizine 25 mg one twice daily

## 2019-06-12 ENCOUNTER — Encounter: Payer: Self-pay | Admitting: Family Medicine

## 2019-06-12 LAB — CUP PACEART REMOTE DEVICE CHECK
Battery Remaining Longevity: 44 mo
Battery Voltage: 2.98 V
Brady Statistic AP VP Percent: 27.6 %
Brady Statistic AP VS Percent: 0 %
Brady Statistic AS VP Percent: 72.36 %
Brady Statistic AS VS Percent: 0.04 %
Brady Statistic RA Percent Paced: 27.55 %
Brady Statistic RV Percent Paced: 99.86 %
Date Time Interrogation Session: 20210224164031
Implantable Lead Implant Date: 20151217
Implantable Lead Implant Date: 20151217
Implantable Lead Implant Date: 20151217
Implantable Lead Location: 753858
Implantable Lead Location: 753859
Implantable Lead Location: 753860
Implantable Lead Model: 4196
Implantable Lead Model: 5076
Implantable Lead Model: 5076
Implantable Pulse Generator Implant Date: 20151217
Lead Channel Impedance Value: 380 Ohm
Lead Channel Impedance Value: 380 Ohm
Lead Channel Impedance Value: 418 Ohm
Lead Channel Impedance Value: 437 Ohm
Lead Channel Impedance Value: 608 Ohm
Lead Channel Impedance Value: 608 Ohm
Lead Channel Impedance Value: 646 Ohm
Lead Channel Impedance Value: 703 Ohm
Lead Channel Impedance Value: 988 Ohm
Lead Channel Pacing Threshold Amplitude: 0.75 V
Lead Channel Pacing Threshold Amplitude: 0.75 V
Lead Channel Pacing Threshold Amplitude: 1 V
Lead Channel Pacing Threshold Pulse Width: 0.4 ms
Lead Channel Pacing Threshold Pulse Width: 0.4 ms
Lead Channel Pacing Threshold Pulse Width: 0.4 ms
Lead Channel Sensing Intrinsic Amplitude: 0.5 mV
Lead Channel Sensing Intrinsic Amplitude: 0.5 mV
Lead Channel Sensing Intrinsic Amplitude: 18.25 mV
Lead Channel Sensing Intrinsic Amplitude: 18.25 mV
Lead Channel Setting Pacing Amplitude: 2 V
Lead Channel Setting Pacing Amplitude: 2.25 V
Lead Channel Setting Pacing Amplitude: 2.5 V
Lead Channel Setting Pacing Pulse Width: 0.4 ms
Lead Channel Setting Pacing Pulse Width: 0.4 ms
Lead Channel Setting Sensing Sensitivity: 0.9 mV

## 2019-06-12 NOTE — Progress Notes (Signed)
Noted for Ermalinda Barrios, PA and agree with plan.   Burtis Junes, RN, [facility] [address] [place], [place]  [id number] [phone]

## 2019-06-15 ENCOUNTER — Encounter: Payer: Self-pay | Admitting: Family Medicine

## 2019-06-16 ENCOUNTER — Telehealth: Payer: Self-pay | Admitting: Family Medicine

## 2019-06-16 ENCOUNTER — Telehealth: Payer: Self-pay

## 2019-06-16 DIAGNOSIS — I951 Orthostatic hypotension: Secondary | ICD-10-CM

## 2019-06-16 DIAGNOSIS — R079 Chest pain, unspecified: Secondary | ICD-10-CM

## 2019-06-16 NOTE — Telephone Encounter (Signed)
Virtual Visit Pre-Appointment Phone Call  "(Name), I am calling you today to discuss your upcoming appointment. We are currently trying to limit exposure to the virus that causes COVID-19 by seeing patients at home rather than in the office."  1. "What is the BEST phone number to call the day of the visit?" - include this in appointment notes  2. "Do you have or have access to (through a family member/friend) a smartphone with video capability that we can use for your visit?" a. If yes - list this number in appt notes as "cell" (if different from BEST phone #) and list the appointment type as a VIDEO visit in appointment notes b. If no - list the appointment type as a PHONE visit in appointment notes  Confirm consent - "In the setting of the current Covid19 crisis, you are scheduled for a (phone or video) visit with your provider on (date) at (time).  Just as we do with many in-office visits, in order for you to participate in this visit, we must obtain consent.  If you'd like, I can send this to your mychart (if signed up) or email for you to review.  Otherwise, I can obtain your verbal consent now.  All virtual visits are billed to your insurance company just like a normal visit would be.  By agreeing to a virtual visit, we'd like you to understand that the technology does not allow for your provider to perform an examination, and thus may limit your provider's ability to fully assess your condition. If your provider identifies any concerns that need to be evaluated in person, we will make arrangements to do so.  Finally, though the technology is pretty good, we cannot assure that it will always work on either your or our end, and in the setting of a video visit, we may have to convert it to a phone-only visit.  In either situation, we cannot ensure that we have a secure connection.  Are you willing to proceed?" STAFF: Did the patient verbally acknowledge consent to telehealth visit? Document  YES/NO here:  3. Advise patient to be prepared - "Two hours prior to your appointment, go ahead and check your blood pressure, pulse, oxygen saturation, and your weight (if you have the equipment to check those) and write them all down. When your visit starts, your provider will ask you for this information. If you have an Apple Watch or Kardia device, please plan to have heart rate information ready on the day of your appointment. Please have a pen and paper handy nearby the day of the visit as well."  4. Give patient instructions for MyChart download to smartphone OR [email or web address] as below if video visit (depending on what platform provider is using)  5. Inform patient they will receive a phone call 15 minutes prior to their appointment time (may be from unknown caller ID) so they should be prepared to answer    TELEPHONE CALL NOTE  [name] has been deemed a candidate for a follow-up tele-health visit to limit community exposure during the Covid-19 pandemic. I spoke with the patient via phone to ensure availability of phone/video source, confirm preferred email & phone number, and discuss instructions and expectations.  I reminded [name] to be prepared with any vital sign and/or heart rhythm information that could potentially be obtained via home monitoring, at the time of his visit. I reminded [name] to expect a phone call prior to his visit.  Dorothey Baseman 06/16/2019 10:24 AM   INSTRUCTIONS FOR DOWNLOADING THE MYCHART APP TO SMARTPHONE  - The patient must first make sure to have activated MyChart and know their login information - If Apple, go to [employer] and type in MyChart in the search bar and download the app. If Android, ask patient to go to [employer] and type in [place] in the search bar and download the app. The app is free but as with any other app downloads, their phone may require them to verify saved payment information or Apple/Android  password.  - The patient will need to then log into the app with their MyChart username and password, and select [facility] as their healthcare provider to link the account. When it is time for your visit, go to the MyChart app, find appointments, and click Begin Video Visit. Be sure to Select Allow for your device to access the Microphone and Camera for your visit. You will then be connected, and your provider will be with you shortly.  **If they have any issues connecting, or need assistance please contact MyChart service desk [phone])**  **If using a computer, in order to ensure the best quality for their visit they will need to use either of the following Internet Browsers: [employer], or Google Chrome**  IF USING DOXIMITY or DOXY.ME - The patient will receive a link just prior to their visit by text.     FULL LENGTH CONSENT FOR TELE-HEALTH VISIT   I hereby voluntarily request, consent and authorize [facility] and its employed or contracted physicians, physician assistants, nurse practitioners or other licensed health care professionals (the Practitioner), to provide me with telemedicine health care services (the "Services") as deemed necessary by the treating Practitioner. I acknowledge and consent to receive the Services by the Practitioner via telemedicine. I understand that the telemedicine visit will involve communicating with the Practitioner through live audiovisual communication technology and the disclosure of certain medical information by electronic transmission. I acknowledge that I have been given the opportunity to request an in-person assessment or other available alternative prior to the telemedicine visit and am voluntarily participating in the telemedicine visit.  I understand that I have the right to withhold or withdraw my consent to the use of telemedicine in the course of my care at any time, without affecting my right to future care or treatment,  and that the Practitioner or I may terminate the telemedicine visit at any time. I understand that I have the right to inspect all information obtained and/or recorded in the course of the telemedicine visit and may receive copies of available information for a reasonable fee.  I understand that some of the potential risks of receiving the Services via telemedicine include:  Marland Kitchen Delay or interruption in medical evaluation due to technological equipment failure or disruption; . Information transmitted may not be sufficient (e.g. poor resolution of images) to allow for appropriate medical decision making by the Practitioner; and/or  . In rare instances, security protocols could fail, causing a breach of personal health information.  Furthermore, I acknowledge that it is my responsibility to provide information about my medical history, conditions and care that is complete and accurate to the best of my ability. I acknowledge that Practitioner's advice, recommendations, and/or decision may be based on factors not within their control, such as incomplete or inaccurate data provided by me or distortions of diagnostic images or specimens that may result from electronic transmissions. I understand that the practice  of medicine is not an Chief Strategy Officer and that Practitioner makes no warranties or guarantees regarding treatment outcomes. I acknowledge that I will receive a copy of this consent concurrently upon execution via email to the email address I last provided but may also request a printed copy by calling the office of [facility].    I understand that my insurance will be billed for this visit.   I have read or had this consent read to me. . I understand the contents of this consent, which adequately explains the benefits and risks of the Services being provided via telemedicine.  . I have been provided ample opportunity to ask questions regarding this consent and the Services and have had my questions  answered to my satisfaction. . I give my informed consent for the services to be provided through the use of telemedicine in my medical care  By participating in this telemedicine visit I agree to the above.

## 2019-06-16 NOTE — Telephone Encounter (Signed)
Urgent referral to Cardiology ordered in [place]. Patient notified by DR Nicki Reaper via [place]

## 2019-06-16 NOTE — Addendum Note (Signed)
Addended by: Dairl Ponder on: 06/16/2019 10:09 AM   Modules accepted: Orders

## 2019-06-16 NOTE — Telephone Encounter (Signed)
Urgent referral Dr. Raliegh Ip Cardiology consultation Chest pain orthostatic hypotension (Please see my chart message)

## 2019-06-17 ENCOUNTER — Other Ambulatory Visit: Payer: Self-pay | Admitting: *Deleted

## 2019-06-17 MED ORDER — ROSUVASTATIN CALCIUM 20 MG PO TABS: 20.0000 mg | ORAL_TABLET | Freq: Every day | ORAL | 0 refills | Status: DC

## 2019-06-18 ENCOUNTER — Encounter: Payer: Self-pay | Admitting: Cardiovascular Disease

## 2019-06-18 ENCOUNTER — Telehealth: Payer: Self-pay | Admitting: Cardiovascular Disease

## 2019-06-18 ENCOUNTER — Telehealth (INDEPENDENT_AMBULATORY_CARE_PROVIDER_SITE_OTHER): Payer: BC Managed Care – PPO | Admitting: Cardiovascular Disease

## 2019-06-18 VITALS — BP 145/84 | HR 60 | Ht 72.0 in | Wt 225.0 lb

## 2019-06-18 DIAGNOSIS — E785 Hyperlipidemia, unspecified: Secondary | ICD-10-CM

## 2019-06-18 DIAGNOSIS — I441 Atrioventricular block, second degree: Secondary | ICD-10-CM

## 2019-06-18 DIAGNOSIS — R079 Chest pain, unspecified: Secondary | ICD-10-CM

## 2019-06-18 DIAGNOSIS — I951 Orthostatic hypotension: Secondary | ICD-10-CM

## 2019-06-18 DIAGNOSIS — I25118 Atherosclerotic heart disease of native coronary artery with other forms of angina pectoris: Secondary | ICD-10-CM

## 2019-06-18 DIAGNOSIS — I4729 Other ventricular tachycardia: Secondary | ICD-10-CM

## 2019-06-18 DIAGNOSIS — I10 Essential (primary) hypertension: Secondary | ICD-10-CM

## 2019-06-18 DIAGNOSIS — I472 Ventricular tachycardia: Secondary | ICD-10-CM

## 2019-06-18 DIAGNOSIS — Z955 Presence of coronary angioplasty implant and graft: Secondary | ICD-10-CM

## 2019-06-18 DIAGNOSIS — R0609 Other forms of dyspnea: Secondary | ICD-10-CM

## 2019-06-18 DIAGNOSIS — R0600 Dyspnea, unspecified: Secondary | ICD-10-CM

## 2019-06-18 DIAGNOSIS — Z950 Presence of cardiac pacemaker: Secondary | ICD-10-CM

## 2019-06-18 NOTE — Progress Notes (Signed)
Virtual Visit via Telephone Note   This visit type was conducted due to national recommendations for restrictions regarding the COVID-19 Pandemic (e.g. social distancing) in an effort to limit this patient's exposure and mitigate transmission in our community.  Due to his co-morbid illnesses, this patient is at least at moderate risk for complications without adequate follow up.  This format is felt to be most appropriate for this patient at this time.  The patient did not have access to video technology/had technical difficulties with video requiring transitioning to audio format only (telephone).  All issues noted in this document were discussed and addressed.  No physical exam could be performed with this format.  Please refer to the patient's chart for his  consent to telehealth for [facility].   Date:  06/18/2019   ID:  [name], DOB Jun 10, 1955, MRN [id number]  Patient Location: Home Provider Location: Home  PCP:  Kathyrn Drown, MD  Cardiologist:  Kate Sable, MD  Electrophysiologist:  Thompson Grayer, MD   Evaluation Performed:  Follow-Up Visit  Chief Complaint:  CAD  History of Present Illness:    [name] is a 64 y.o. male with coronary artery disease and underwent drug-eluting stent placement to the mid LAD on 08/22/2018.  He has a history of prior PCI of the RCA in 2014.  He has complete heart block and underwent pacemaker placement in September 2014 with Medtronic CRT-P in December 2015. He also has hypertension, hyperlipidemia, and GERD.  He's had BP fluctuations. Says his HR is irregular. He has occasional chest pains. He has back pain.  Device interrogation on 06/11/19 showed normal device function with one short of NSVT.  I also spoke with his wife, [name], as well by speakerphone.  He's been trying to walk and occasionally has exertional chest pain and shortness of breath primarily for the past week.  He was evaluated by his PCP and he was found to  have orthostatic hypotension in his office.  He decreased the dosages of both amlodipine and hydralazine.  The patient told me that he drinks about two 16 ounce bottles of water a day.  He has also been drinking 2 shots of whiskey daily more recently and asked me if this could be contributing to his symptoms.  Past Medical History:  Diagnosis Date  . ADD (attention deficit disorder)    Rx-Adderall  . Anginal pain ([place])   . AV block, 3rd degree (HCC)   . Colon polyps 2011   tubular adenoma by excisional biopsy during colonoscopy  . Complete heart block [facility])    a. s/p PPM Placement in 12/2012 with Medtronic CRT-P placement in 03/2014  . Coronary artery disease    a. s/p PCI of the RCA in 2014 b. 08/2018: patent RCA stent with new mid-LAD 85% stenosis (treated with PCI/DES) and 85% distal OM2 stenosis (med management recommended)  . Degenerative disc disease, cervical   . Gastrointestinal bleeding 10/15/2017  . GERD (gastroesophageal reflux disease)   . Hypertension   . Pacemaker   . Palpitations 2003   Holter in 2003: PACs and PVCs; negative stress nuclear test in 2005; right bundle branch block; echo in 2008-mild LVH, otherwise normal; 2008-negative stress nuclear test.  . Pneumonia   . Prostatitis    prostate calcifications by CT  . Sleep apnea    questionable diagnosis   Past Surgical History:  Procedure Laterality Date  . ANTERIOR CERVICAL DECOMP/DISCECTOMY FUSION    . BACK SURGERY    .  Biventricular pacemaker upgrade/ system revision  04/02/14   removal of previous atrial and ventricular leads with placement of a new MDT Consulta CRT-P system at [facility] by Dr Violet Baldy  . COLONOSCOPY    . COLONOSCOPY W/ POLYPECTOMY  2011   tubular adenoma  . CORONARY STENT INTERVENTION N/A 08/22/2018   Procedure: CORONARY STENT INTERVENTION;  Surgeon: Troy Sine, MD;  Location: [place] CV LAB;  Service: Cardiovascular;  Laterality: N/A;  .  ESOPHAGOGASTRODUODENOSCOPY     Gastritis  . INSERT / REPLACE / REMOVE PACEMAKER  12/17/2012   MDT Adapta L implanted by Dr Rayann Heman for mobitz II second degree AV block  . KNEE SURGERY Right   . LEFT HEART CATH AND CORONARY ANGIOGRAPHY N/A 08/22/2018   Procedure: LEFT HEART CATH AND CORONARY ANGIOGRAPHY;  Surgeon: Troy Sine, MD;  Location: [place] CV LAB;  Service: Cardiovascular;  Laterality: N/A;  . LEFT HEART CATHETERIZATION WITH CORONARY ANGIOGRAM N/A 10/17/2012   Procedure: LEFT HEART CATHETERIZATION WITH CORONARY ANGIOGRAM;  Surgeon: Josue Hector, MD;  Location: [facility] CATH LAB;  Service: Cardiovascular;  Laterality: N/A;  . LEFT HEART CATHETERIZATION WITH CORONARY ANGIOGRAM N/A 12/17/2012   Procedure: LEFT HEART CATHETERIZATION WITH CORONARY ANGIOGRAM;  Surgeon: Peter M Martinique, MD;  Location: [facility] CATH LAB;  Service: Cardiovascular;  Laterality: N/A;  . LEFT HEART CATHETERIZATION WITH CORONARY ANGIOGRAM N/A 06/11/2013   Procedure: LEFT HEART CATHETERIZATION WITH CORONARY ANGIOGRAM;  Surgeon: Wellington Hampshire, MD;  Location: [place] CATH LAB;  Service: Cardiovascular;  Laterality: N/A;  . PERCUTANEOUS CORONARY STENT INTERVENTION (PCI-S)  10/17/2012   Procedure: PERCUTANEOUS CORONARY STENT INTERVENTION (PCI-S);  Surgeon: Josue Hector, MD;  Location: [facility] CATH LAB;  Service: Cardiovascular;;  . PERMANENT PACEMAKER INSERTION N/A 12/17/2012   Procedure: PERMANENT PACEMAKER INSERTION;  Surgeon: Thompson Grayer, MD;  Location: [facility] CATH LAB;  Service: Cardiovascular;  Laterality: N/A;  . POLYPECTOMY    . UPPER GASTROINTESTINAL ENDOSCOPY       Current Meds  Medication Sig  . albuterol (VENTOLIN HFA) 108 (90 Base) MCG/ACT inhaler Inhale 2 puffs into the lungs every 4 (four) hours as needed for wheezing.  Marland Kitchen ALPRAZolam (XANAX) 0.5 MG tablet TAKE 1 TABLET(0.5 MG) BY MOUTH TWICE DAILY AS NEEDED FOR ANXIETY OR SLEEP  . amLODipine (NORVASC) 2.5 MG tablet TAKE 1 TABLET(2.5 MG) BY MOUTH DAILY  . aspirin 81 MG  chewable tablet Chew 81 mg by mouth daily.  . clopidogrel (PLAVIX) 75 MG tablet The day after you stop Brilinta, Take Plavix 300 mg ( 4 tablets) one time, then take 75 mg daily thereafter  . cyclobenzaprine (FLEXERIL) 5 MG tablet Take 5 mg by mouth at bedtime as needed for muscle spasms.  Marland Kitchen esomeprazole (NEXIUM) 40 MG capsule TAKE 1 CAPSULE BY MOUTH EVERY DAY  . gabapentin (NEURONTIN) 100 MG capsule TAKE 1 CAPSULE(100 MG) BY MOUTH THREE TIMES DAILY  . hydrALAZINE (APRESOLINE) 25 MG tablet Take one tablet po twice a day  . HYDROmorphone (DILAUDID) 4 MG tablet Take 1 tablet (4 mg total) by mouth every 6 (six) hours as needed for severe pain.  . isosorbide mononitrate (IMDUR) 60 MG 24 hr tablet Take 1 tablet (60 mg total) by mouth daily.  Marland Kitchen losartan (COZAAR) 100 MG tablet Take 1 tablet (100 mg total) by mouth daily.  . nadolol (CORGARD) 80 MG tablet Take 1 tablet (80 mg total) by mouth daily.  . nitroGLYCERIN (NITROSTAT) 0.4 MG SL tablet Place 1 tablet (0.4 mg total) under  the tongue every 5 (five) minutes as needed for chest pain (MAX 3 TABLETS).  . promethazine (PHENERGAN) 25 MG tablet Take one tablet bid prn nausea  . rosuvastatin (CRESTOR) 20 MG tablet Take 1 tablet (20 mg total) by mouth daily.  . sertraline (ZOLOFT) 100 MG tablet Take one tablet po daily  . tamsulosin (FLOMAX) 0.4 MG CAPS capsule Take one capsule po daily     Allergies:   Morphine and related, Lasix [furosemide], and Latex   Social History   Tobacco Use  . Smoking status: Former Smoker    Packs/day: 2.50    Years: 40.00    Pack years: 100.00    Types: Cigarettes    Start date: 04/18/1967    Quit date: 12/20/2003    Years since quitting: 15.5  . Smokeless tobacco: Former Network engineer Use Topics  . Alcohol use: Yes    Alcohol/week: 1.0 standard drinks    Types: 1 Cans of beer per week  . Drug use: Not Currently    Types: Marijuana    Comment: 30 + years ago - "crank, marjiuanna, ect."     Family Hx: The  patient's family history includes Breast cancer in his maternal aunt; Heart disease in his mother; Hypertension in his mother; Prostate cancer in his brother. There is no history of Colon cancer or Colon polyps.  ROS:   Please see the history of present illness.     All other systems reviewed and are negative.   Prior CV studies:   The following studies were reviewed today:  Cardiac Catheterization: 08/22/2018  Prox RCA to Mid RCA lesion is 20% stenosed.  Dist Cx lesion is 85% stenosed.  Prox Cx lesion is 10% stenosed.  Ost Cx lesion is 10% stenosed.  Mid LAD lesion is 85% stenosed.  Post intervention, there is a 0% residual stenosis.  The left ventricular systolic function is normal.  LV end diastolic pressure is normal.  A stent was successfully placed.  Preserved global LV function with an EF estimated at 60 to 65%. LVEDP 15 mm.  Multivessel CAD with new 85% mid LAD stenosis; 10% ostial 10% proximal and 10% AV groove circumflex stenoses with 80% stenosis in a distal branch of an OM 2 vessel; patent RCA stent with intimal hyperplasia of approximately 20%.  Successful PCI of the mid LAD with ultimate insertion of a 2.5 x 22 mm Resolute Onyx DES stent postdilated to 2.6 mm with the 85% stenosis being reduced to 0%.  RECOMMENDATION: DAPT for minimum of 6 months but preferably longer. Medical therapy for concomitant CAD. Optimal blood pressure control with ideal blood pressure less than 120/80. Aggressive lipid-lowering therapy with target LDL less than 70.  Labs/Other Tests and Data Reviewed:    EKG: I personally reviewed the ECG performed on 06/12/2019 which demonstrates a ventricular paced rhythm.  Recent Labs: 06/10/2019: ALT 20; BUN 13; Creatinine, Ser 0.98; Hemoglobin 15.7; Platelets 214; Potassium 4.1; Sodium 144   Recent Lipid Panel Lab Results  Component Value Date/Time   CHOL 131 06/10/2019 09:09 AM   TRIG 125 06/10/2019 09:09 AM   HDL 52 06/10/2019  09:09 AM   CHOLHDL 2.5 06/10/2019 09:09 AM   CHOLHDL 2.5 12/17/2012 02:15 AM   LDLCALC 57 06/10/2019 09:09 AM    Wt Readings from Last 3 Encounters:  06/18/19 225 lb (102.1 kg)  06/11/19 229 lb 9.6 oz (104.1 kg)  04/22/19 238 lb 3.2 oz (108 kg)     Objective:  Vital Signs:  BP (!) 145/84   Pulse 60   Ht 6' (1.829 m)   Wt 225 lb (102.1 kg)   BMI 30.52 kg/m    VITAL SIGNS:  reviewed  ASSESSMENT & PLAN:    1.  Coronary artery disease: History of PCI to the RCA in 2014 and PCI of the mid LAD on 08/22/2018.  He has been having intermittent exertional chest pain and dyspnea but has also had orthostatic hypotension.  He previously did not tolerate Brilinta as it led to shortness of breath and the symptoms resolved by switching to clopidogrel.  Continue aspirin, beta-blocker, nitrates, and statin.  I will obtain an echocardiogram to evaluate cardiac structure and function.  Please refer to my discussion regarding orthostatic hypotension below.  If symptoms do not resolve after 2 weeks, I will increase the dose of Imdur.  2.  Hypertension/orthostatic hypotension: Blood pressure is mildly elevated today but he has been having orthostatic hypotension.  Doses of amlodipine and hydralazine were recently decreased by his PCP.  I told him to increase water consumption to a minimum of 64 ounces daily.  I also asked him to reduce alcohol consumption which could be contributing to overall symptoms.  3.  Hyperlipidemia: Lipids reviewed above.  Continue statin.  4.  Complete heart block: Status post pacemaker in September 2014 with Medtronic CRT-P placement in December 2015.  Normal device function.  He has a history of paroxysmal NSVT for which he takes nadolol. Device interrogation on 06/11/19 showed normal device function with one short of NSVT. He is followed by Dr. Rayann Heman for this.   COVID-19 Education: The signs and symptoms of COVID-19 were discussed with the patient and how to seek care for  testing (follow up with PCP or arrange E-visit).  The importance of social distancing was discussed today.  Time:   Today, I have spent 25 minutes with the patient with telehealth technology discussing the above problems.     Medication Adjustments/Labs and Tests Ordered: Current medicines are reviewed at length with the patient today.  Concerns regarding medicines are outlined above.   Tests Ordered: No orders of the defined types were placed in this encounter.   Medication Changes: No orders of the defined types were placed in this encounter.   Follow Up:  Virtual Visit  in 3 month(s)  Signed, Kate Sable, MD  06/18/2019 10:16 AM    [place]

## 2019-06-18 NOTE — Addendum Note (Signed)
Addended byDebbora Lacrosse R on: 06/18/2019 11:05 AM   Modules accepted: Orders

## 2019-06-18 NOTE — Telephone Encounter (Signed)
Pre-cert Verification for the following procedure    Echo scheduled for 06/20/2019 at [facility]

## 2019-06-18 NOTE — Patient Instructions (Signed)
Medication Instructions:  Your physician recommends that you continue on your current medications as directed. Please refer to the Current Medication list given to you today.   Labwork: none  Testing/Procedures: Your physician has requested that you have an echocardiogram. Echocardiography is a painless test that uses sound waves to create images of your heart. It provides your doctor with information about the size and shape of your heart and how well your heart's chambers and valves are working. This procedure takes approximately one hour. There are no restrictions for this procedure.   Follow-Up: Your physician recommends that you schedule a follow-up appointment in: 3 months    Any Other Special Instructions Will Be Listed Below (If Applicable).  Increase water intake to 64 oz daily  Reduce alcohol consumption    If you need a refill on your cardiac medications before your next appointment, please call your pharmacy.

## 2019-06-20 ENCOUNTER — Telehealth: Payer: Self-pay

## 2019-06-20 ENCOUNTER — Ambulatory Visit (HOSPITAL_COMMUNITY)
Admission: RE | Admit: 2019-06-20 | Discharge: 2019-06-20 | Disposition: A | Discharge status: Home / Self Care | Payer: BC Managed Care – PPO | Source: Ambulatory Visit | Attending: Cardiovascular Disease | Admitting: Cardiovascular Disease

## 2019-06-20 DIAGNOSIS — R079 Chest pain, unspecified: Secondary | ICD-10-CM | POA: Diagnosis not present

## 2019-06-20 DIAGNOSIS — R0600 Dyspnea, unspecified: Secondary | ICD-10-CM | POA: Diagnosis not present

## 2019-06-20 DIAGNOSIS — R0609 Other forms of dyspnea: Secondary | ICD-10-CM

## 2019-06-20 MED ORDER — ISOSORBIDE MONONITRATE ER 60 MG PO TB24: 90.0000 mg | ORAL_TABLET | Freq: Every day | ORAL | 3 refills | Status: DC

## 2019-06-20 NOTE — Telephone Encounter (Signed)
I spoke with patient.He will increase Imdur to 90 mg daily and call us back with dispo.

## 2019-06-20 NOTE — Telephone Encounter (Signed)
-----   Message from Herminio Commons, MD sent at 06/20/2019  1:44 PM EST ----- Regarding: RE: still sob sure ----- Message ----- From: Bernita Raisin, RN Sent: 06/20/2019   1:30 PM EST To: Herminio Commons, MD Subject: RE: still sob                                  Thanks, I see you indicated you would increase imdur, I didn't see any dosage for 90 mg in the note. Since it has not be two weeks and he is c/o sx's , do you want his imdur dose to increase to 90 mg ? ----- Message ----- From: Herminio Commons, MD Sent: 06/20/2019   1:09 PM EST To: Bernita Raisin, RN Subject: RE: still sob                                  I said after 2 weeks, would increase to 90 mg. ----- Message ----- From: Bernita Raisin, RN Sent: 06/20/2019   1:05 PM EST To: Herminio Commons, MD Subject: RE: still sob                                  He takes 60 mg imdur qd, what dose now ----- Message ----- From: Herminio Commons, MD Sent: 06/20/2019  12:52 PM EST To: Bernita Raisin, RN Subject: RE: still sob                                  If symptoms persist (as I told him 2 days ago), I would increase the dose of Imdur ----- Message ----- From: Bernita Raisin, RN Sent: 06/20/2019  12:37 PM EST To: Herminio Commons, MD Subject: still sob                                      Gave pt echo results, he says he still has sob and cp with walking.Is drinking 64 oz water per day.Wonders if pacemaker, had similar feelings before. Is going to call device clinic

## 2019-06-20 NOTE — Progress Notes (Signed)
*  PRELIMINARY RESULTS* Echocardiogram 2D Echocardiogram has been performed.  [name] 06/20/2019, 9:16 AM

## 2019-06-24 ENCOUNTER — Encounter: Payer: Self-pay | Admitting: Family Medicine

## 2019-07-02 ENCOUNTER — Encounter: Payer: Self-pay | Admitting: Family Medicine

## 2019-07-09 ENCOUNTER — Other Ambulatory Visit: Payer: Self-pay

## 2019-07-09 ENCOUNTER — Ambulatory Visit (INDEPENDENT_AMBULATORY_CARE_PROVIDER_SITE_OTHER): Payer: BC Managed Care – PPO | Admitting: Family Medicine

## 2019-07-09 VITALS — BP 134/80

## 2019-07-09 DIAGNOSIS — Z0000 Encounter for general adult medical examination without abnormal findings: Secondary | ICD-10-CM | POA: Diagnosis not present

## 2019-07-09 MED ORDER — SERTRALINE HCL 100 MG PO TABS: ORAL_TABLET | ORAL | 1 refills | Status: DC

## 2019-07-09 MED ORDER — ALBUTEROL SULFATE HFA 108 (90 BASE) MCG/ACT IN AERS: 2.0000 | INHALATION_SPRAY | RESPIRATORY_TRACT | 2 refills | Status: DC | PRN

## 2019-07-09 MED ORDER — ROSUVASTATIN CALCIUM 20 MG PO TABS: 20.0000 mg | ORAL_TABLET | Freq: Every day | ORAL | 1 refills | Status: DC

## 2019-07-09 MED ORDER — HYDRALAZINE HCL 25 MG PO TABS: ORAL_TABLET | ORAL | 1 refills | Status: DC

## 2019-07-09 MED ORDER — ALPRAZOLAM 0.5 MG PO TABS: ORAL_TABLET | ORAL | 0 refills | Status: DC

## 2019-07-09 MED ORDER — AMLODIPINE BESYLATE 2.5 MG PO TABS: ORAL_TABLET | ORAL | 1 refills | Status: DC

## 2019-07-09 MED ORDER — LOSARTAN POTASSIUM 100 MG PO TABS: 100.0000 mg | ORAL_TABLET | Freq: Every day | ORAL | 1 refills | Status: DC

## 2019-07-09 MED ORDER — HYDROMORPHONE HCL 4 MG PO TABS: 4.0000 mg | ORAL_TABLET | Freq: Four times a day (QID) | ORAL | 0 refills | Status: DC | PRN

## 2019-07-09 NOTE — Progress Notes (Signed)
Subjective:    Patient ID: [name], male    DOB: 10-22-1955, 64 y.o.   MRN: [id number]  HPI The patient comes in today for a wellness visit.  Patient has seen cardiology they adjust the medicines they added Imdur his blood pressure has been under decent control  A review of their health history was completed.  A review of medications was also completed.  Any needed refills; xanax, hydromorphone  Eating habits: health conscious  Falls/  MVA accidents in past few months: none  Regular exercise: tries to do some walking  Specialist pt sees on regular basis: none  Preventative health issues were discussed.   Additional concerns: high bp Pt declined to do phq9    Review of Systems  Constitutional: Negative for activity change, appetite change and fever.  HENT: Negative for congestion and rhinorrhea.   Eyes: Negative for discharge.  Respiratory: Negative for cough and wheezing.   Cardiovascular: Negative for chest pain.  Gastrointestinal: Negative for abdominal pain, blood in stool and vomiting.  Genitourinary: Negative for difficulty urinating and frequency.  Musculoskeletal: Negative for neck pain.  Skin: Negative for rash.  Allergic/Immunologic: Negative for environmental allergies and food allergies.  Neurological: Negative for weakness and headaches.  Psychiatric/Behavioral: Negative for agitation.       Objective:   Physical Exam Constitutional:      Appearance: He is well-developed.  HENT:     Head: Normocephalic and atraumatic.     Right Ear: External ear normal.     Left Ear: External ear normal.     Nose: Nose normal.  Eyes:     Pupils: Pupils are equal, round, and reactive to light.  Neck:     Thyroid: No thyromegaly.  Cardiovascular:     Rate and Rhythm: Normal rate and regular rhythm.     Heart sounds: Normal heart sounds. No murmur.  Pulmonary:     Effort: Pulmonary effort is normal. No respiratory distress.     Breath sounds: Normal  breath sounds. No wheezing.  Abdominal:     General: Bowel sounds are normal. There is no distension.     Palpations: Abdomen is soft. There is no mass.     Tenderness: There is no abdominal tenderness.  Genitourinary:    Penis: Normal.   Musculoskeletal:        General: Normal range of motion.     Cervical back: Normal range of motion and neck supple.  Lymphadenopathy:     Cervical: No cervical adenopathy.  Skin:    General: Skin is warm and dry.     Findings: No erythema.  Neurological:     Mental Status: He is alert.     Motor: No abnormal muscle tone.  Psychiatric:        Behavior: Behavior normal.        Judgment: Judgment normal.     Prostate normal      Assessment & Plan:  Adult wellness-complete.wellness physical was conducted today. Importance of diet and exercise were discussed in detail.  In addition to this a discussion regarding safety was also covered. We also reviewed over immunizations and gave recommendations regarding current immunization needed for age.  In addition to this additional areas were also touched on including: Preventative health exams needed:  Colonoscopy 2022  Patient was advised yearly wellness exam  His blood pressure readings sitting standing laying were all good I would relieve the medication as is No need to do any lab work currently  Recommend shingles vaccine later this year after he is finished Covid vaccine  Refill of pain medication given for occasional use with his back  Refill of nerve medication given for occasional use with his nerves never to take the 2 medications together never to drive if feeling drowsy

## 2019-07-10 DIAGNOSIS — Z23 Encounter for immunization: Secondary | ICD-10-CM | POA: Diagnosis not present

## 2019-07-10 DIAGNOSIS — F4312 Post-traumatic stress disorder, chronic: Secondary | ICD-10-CM | POA: Diagnosis not present

## 2019-07-27 ENCOUNTER — Other Ambulatory Visit: Payer: Self-pay | Admitting: Family Medicine

## 2019-08-02 DIAGNOSIS — Z23 Encounter for immunization: Secondary | ICD-10-CM | POA: Diagnosis not present

## 2019-08-08 ENCOUNTER — Telehealth: Payer: Self-pay

## 2019-08-08 NOTE — Telephone Encounter (Signed)
Called pt to confirm his virtual appt for 08/11/19. Pt staed he would rather come in office. Pt was advise to keep his appt and discuss all his concerns with Dr. Rayann Heman, but declined. Pt was in informed that his appt will be cancelled and Doylene Canning will reach out to reschedule his appt. Pt questions and concerns were address.

## 2019-08-11 ENCOUNTER — Telehealth: Payer: BC Managed Care – PPO | Admitting: Internal Medicine

## 2019-08-11 ENCOUNTER — Other Ambulatory Visit: Payer: Self-pay | Admitting: Family Medicine

## 2019-08-17 ENCOUNTER — Other Ambulatory Visit: Payer: Self-pay | Admitting: Family Medicine

## 2019-08-21 DIAGNOSIS — F4312 Post-traumatic stress disorder, chronic: Secondary | ICD-10-CM | POA: Diagnosis not present

## 2019-08-26 ENCOUNTER — Other Ambulatory Visit: Payer: Self-pay

## 2019-08-26 ENCOUNTER — Encounter: Payer: Self-pay | Admitting: Family Medicine

## 2019-08-26 ENCOUNTER — Ambulatory Visit (INDEPENDENT_AMBULATORY_CARE_PROVIDER_SITE_OTHER): Payer: BC Managed Care – PPO | Admitting: Family Medicine

## 2019-08-26 VITALS — BP 148/76 | HR 68 | Temp 97.5°F | Wt 231.0 lb

## 2019-08-26 DIAGNOSIS — R0600 Dyspnea, unspecified: Secondary | ICD-10-CM

## 2019-08-26 DIAGNOSIS — R0789 Other chest pain: Secondary | ICD-10-CM

## 2019-08-26 DIAGNOSIS — R0609 Other forms of dyspnea: Secondary | ICD-10-CM

## 2019-08-26 MED ORDER — AMLODIPINE BESYLATE 5 MG PO TABS: ORAL_TABLET | ORAL | 5 refills | Status: DC

## 2019-08-26 MED ORDER — HYDROMORPHONE HCL 4 MG PO TABS: 4.0000 mg | ORAL_TABLET | Freq: Four times a day (QID) | ORAL | 0 refills | Status: DC | PRN

## 2019-08-26 MED ORDER — AMLODIPINE BESYLATE 5 MG PO TABS: 5.0000 mg | ORAL_TABLET | Freq: Every day | ORAL | 1 refills | Status: DC

## 2019-08-26 NOTE — Progress Notes (Signed)
   Subjective:    Patient ID: [name], male    DOB: 1956-03-30, 64 y.o.   MRN: [id number]  HPI Pt here today for trouble breathing. Been going on for about 4-5 days. Has had dreams of suffocation. Pt states that with just about any activity he becomes short winded. Pt can walk 3 miles and is fine, but when he gets up from chair to walk to kitchen, he becomes out of breath. Pt has been using inhalers but no help. Pt has had stints and 2nd COVID and is wondering if that has anything to do with his breathing.  Patient relates shortness of breath with activity he also relates some chest intermittent tightness with long distance walking Patient relates having intermittent shortness of breath with mild activity sometimes not at all with walking other times with this moving around the house O2 saturations have been in the 90s patient does have underlying history of heart disease and stents Review of Systems  Constitutional: Negative for diaphoresis and fatigue.  HENT: Negative for congestion and rhinorrhea.   Respiratory: Positive for shortness of breath. Negative for cough.   Cardiovascular: Positive for chest pain. Negative for leg swelling.  Gastrointestinal: Negative for abdominal pain and diarrhea.  Skin: Negative for color change and rash.  Neurological: Negative for dizziness and headaches.  Psychiatric/Behavioral: Negative for behavioral problems and confusion.       Objective:   Physical Exam Vitals reviewed.  HENT:     Head: Normocephalic and atraumatic.     Nose: No congestion or rhinorrhea.  Cardiovascular:     Rate and Rhythm: Normal rate.     Heart sounds: Normal heart sounds. No murmur. No gallop.   Pulmonary:     Effort: Pulmonary effort is normal.     Breath sounds: Normal breath sounds. No wheezing or rhonchi.  Lymphadenopathy:     Cervical: No cervical adenopathy.  Neurological:     Mental Status: He is alert.  Psychiatric:        Behavior: Behavior normal.            Assessment & Plan:  Intermittent shortness of breath with history of heart disease I doubt that this is underlying COPD I do not feel pulmonary consult at this point would be the right step I do recommend consultation with cardiology Blood pressure on recheck still elevated Therefore increase amlodipine new dose 5 mg Patient not always taking his middle of the day dose of hydralazine he was encouraged to be consistent Patient would benefit from stress Myoview Urgent referral to cardiology

## 2019-08-26 NOTE — Progress Notes (Signed)
   Subjective:    Patient ID: [name], male    DOB: 03-01-1956, 64 y.o.   MRN: [id number]  HPI    Review of Systems     Objective:   Physical Exam        Assessment & Plan:

## 2019-08-26 NOTE — Addendum Note (Signed)
Addended by: Patsy Lager on: 08/26/2019 01:31 PM   Modules accepted: Orders

## 2019-08-26 NOTE — Addendum Note (Signed)
Addended by: Patsy Lager on: 08/26/2019 01:04 PM   Modules accepted: Orders

## 2019-08-29 ENCOUNTER — Encounter: Payer: Self-pay | Admitting: Cardiovascular Disease

## 2019-08-29 ENCOUNTER — Ambulatory Visit: Payer: BC Managed Care – PPO | Admitting: Cardiovascular Disease

## 2019-08-29 ENCOUNTER — Other Ambulatory Visit: Payer: Self-pay

## 2019-08-29 ENCOUNTER — Other Ambulatory Visit (HOSPITAL_COMMUNITY)
Admission: RE | Admit: 2019-08-29 | Discharge: 2019-08-29 | Disposition: A | Discharge status: Home / Self Care | Payer: BC Managed Care – PPO | Source: Ambulatory Visit | Attending: Cardiovascular Disease | Admitting: Cardiovascular Disease

## 2019-08-29 VITALS — BP 140/76 | HR 69 | Temp 97.5°F | Ht 71.0 in | Wt 235.0 lb

## 2019-08-29 DIAGNOSIS — R0600 Dyspnea, unspecified: Secondary | ICD-10-CM

## 2019-08-29 DIAGNOSIS — I25118 Atherosclerotic heart disease of native coronary artery with other forms of angina pectoris: Secondary | ICD-10-CM | POA: Diagnosis not present

## 2019-08-29 DIAGNOSIS — I10 Essential (primary) hypertension: Secondary | ICD-10-CM | POA: Diagnosis not present

## 2019-08-29 DIAGNOSIS — R0609 Other forms of dyspnea: Secondary | ICD-10-CM

## 2019-08-29 DIAGNOSIS — Z950 Presence of cardiac pacemaker: Secondary | ICD-10-CM

## 2019-08-29 DIAGNOSIS — Z955 Presence of coronary angioplasty implant and graft: Secondary | ICD-10-CM

## 2019-08-29 DIAGNOSIS — E785 Hyperlipidemia, unspecified: Secondary | ICD-10-CM

## 2019-08-29 DIAGNOSIS — I441 Atrioventricular block, second degree: Secondary | ICD-10-CM

## 2019-08-29 LAB — D-DIMER, QUANTITATIVE: D-Dimer, Quant: 0.48 ug/mL-FEU (ref 0.00–0.50)

## 2019-08-29 LAB — BRAIN NATRIURETIC PEPTIDE: B Natriuretic Peptide: 58 pg/mL (ref 0.0–100.0)

## 2019-08-29 MED ORDER — ISOSORBIDE MONONITRATE ER 120 MG PO TB24
120.0000 mg | ORAL_TABLET | Freq: Every day | ORAL | 3 refills | Status: DC
Start: 2019-08-29 — End: 2019-11-10

## 2019-08-29 NOTE — Patient Instructions (Signed)
Medication Instructions: INCREASE Imdur to 120 mg daily    Labwork: BNP, D-Dimer   Procedures/Testing: Your physician has requested that you have a lexiscan myoview. For further information please visit [email or web address]. Please follow instruction sheet, as given.    Follow-Up: Virtual Visit in 1 month with Dr.Koneswaran  Any Additional Special Instructions Will Be Listed Below (If Applicable).     If you need a refill on your cardiac medications before your next appointment, please call your pharmacy.       Thank you for choosing [facility] !

## 2019-08-29 NOTE — Progress Notes (Signed)
SUBJECTIVE: [name] is a 64 y.o. male with coronary artery disease and underwent drug-eluting stent placement to the mid LAD on 08/22/2018. He has a history of prior PCI of the RCA in 2014. He has complete heart block and underwent pacemaker placement in September 2014 with Medtronic CRT-P in December 2015. He also has hypertension, hyperlipidemia, and GERD.  His PCP contacted me because the patient has been experiencing increasing exertional dyspnea.  The patient tells me he has been short of breath over the past 2 weeks.  It can occur when he is driving or when he is talking.  It also occurs when he is walking.  He walks 4 miles several days a week.  While he has exertional dyspnea he is able to complete the distance.  He has had some episodic chest pains at the beginning of the 4 mile walk.  He is checked his O2 sats and they are always in the high 90% range.  I previously ordered an echocardiogram performed in March 2021 which demonstrated normal LV systolic function and grade 1 diastolic dysfunction, LVEF 60 to 65%.  There was no significant valvular pathology.  CBC on 06/10/2019 was normal.  He tried Xanax to see if it was caused by his anxiety but this did not alleviate his symptoms.  He also has chronic lower back pain with radiation to the left leg.  He is married to [place].    Review of Systems: As per "subjective", otherwise negative.  Allergies  Allergen Reactions  . Morphine And Related Itching and Rash    redness  . Lasix [Furosemide]     Chest pain and tightness  . Latex Rash    Current Outpatient Medications  Medication Sig Dispense Refill  . albuterol (VENTOLIN HFA) 108 (90 Base) MCG/ACT inhaler Inhale 2 puffs into the lungs every 4 (four) hours as needed for wheezing. 18 g 2  . ALPRAZolam (XANAX) 0.5 MG tablet TAKE 1 TABLET(0.5 MG) BY MOUTH TWICE DAILY AS NEEDED FOR ANXIETY OR SLEEP 20 tablet 0  . amLODipine (NORVASC) 5 MG tablet TAKE 1 TABLET(2.5  MG) BY MOUTH DAILY 30 tablet 5  . amLODipine (NORVASC) 5 MG tablet Take 1 tablet (5 mg total) by mouth daily. 90 tablet 1  . aspirin 81 MG chewable tablet Chew 81 mg by mouth daily.    . clopidogrel (PLAVIX) 75 MG tablet The day after you stop Brilinta, Take Plavix 300 mg ( 4 tablets) one time, then take 75 mg daily thereafter 90 tablet 3  . cyclobenzaprine (FLEXERIL) 5 MG tablet Take 5 mg by mouth at bedtime as needed for muscle spasms.    Marland Kitchen esomeprazole (NEXIUM) 40 MG capsule TAKE 1 CAPSULE BY MOUTH EVERY DAY 30 capsule 2  . gabapentin (NEURONTIN) 100 MG capsule TAKE 1 CAPSULE(100 MG) BY MOUTH THREE TIMES DAILY 90 capsule 2  . hydrALAZINE (APRESOLINE) 25 MG tablet Take two tablets in the morning, one tablet midday and two in the evening 450 tablet 1  . HYDROmorphone (DILAUDID) 4 MG tablet Take 1 tablet (4 mg total) by mouth every 6 (six) hours as needed for severe pain. 20 tablet 0  . isosorbide mononitrate (IMDUR) 60 MG 24 hr tablet Take 1.5 tablets (90 mg total) by mouth daily. 135 tablet 3  . losartan (COZAAR) 100 MG tablet TAKE 1 TABLET(100 MG) BY MOUTH DAILY 90 tablet 0  . nadolol (CORGARD) 80 MG tablet Take 1 tablet (80 mg total) by mouth daily.  30 tablet 1  . nitroGLYCERIN (NITROSTAT) 0.4 MG SL tablet Place 1 tablet (0.4 mg total) under the tongue every 5 (five) minutes as needed for chest pain (MAX 3 TABLETS). 25 tablet 3  . promethazine (PHENERGAN) 25 MG tablet TAKE 1 TABLET BY MOUTH TWICE DAILY AS NEEDED FOR NAUSEA 30 tablet 5  . rosuvastatin (CRESTOR) 20 MG tablet TAKE 1 TABLET(20 MG) BY MOUTH DAILY 30 tablet 4  . sertraline (ZOLOFT) 100 MG tablet Take one tablet po daily 90 tablet 1  . tamsulosin (FLOMAX) 0.4 MG CAPS capsule Take one capsule po daily 60 capsule 2   No current facility-administered medications for this visit.    Past Medical History:  Diagnosis Date  . ADD (attention deficit disorder)    Rx-Adderall  . Anginal pain ([place])   . AV block, 3rd degree (HCC)   .  Colon polyps 2011   tubular adenoma by excisional biopsy during colonoscopy  . Complete heart block [facility])    a. s/p PPM Placement in 12/2012 with Medtronic CRT-P placement in 03/2014  . Coronary artery disease    a. s/p PCI of the RCA in 2014 b. 08/2018: patent RCA stent with new mid-LAD 85% stenosis (treated with PCI/DES) and 85% distal OM2 stenosis (med management recommended)  . Degenerative disc disease, cervical   . Gastrointestinal bleeding 10/15/2017  . GERD (gastroesophageal reflux disease)   . Hypertension   . Pacemaker   . Palpitations 2003   Holter in 2003: PACs and PVCs; negative stress nuclear test in 2005; right bundle branch block; echo in 2008-mild LVH, otherwise normal; 2008-negative stress nuclear test.  . Pneumonia   . Prostatitis    prostate calcifications by CT  . Sleep apnea    questionable diagnosis    Past Surgical History:  Procedure Laterality Date  . ANTERIOR CERVICAL DECOMP/DISCECTOMY FUSION    . BACK SURGERY    . Biventricular pacemaker upgrade/ system revision  04/02/14   removal of previous atrial and ventricular leads with placement of a new MDT Consulta CRT-P system at [facility] by Dr Violet Baldy  . COLONOSCOPY    . COLONOSCOPY W/ POLYPECTOMY  2011   tubular adenoma  . CORONARY STENT INTERVENTION N/A 08/22/2018   Procedure: CORONARY STENT INTERVENTION;  Surgeon: Troy Sine, MD;  Location: [place] CV LAB;  Service: Cardiovascular;  Laterality: N/A;  . ESOPHAGOGASTRODUODENOSCOPY     Gastritis  . INSERT / REPLACE / REMOVE PACEMAKER  12/17/2012   MDT Adapta L implanted by Dr Rayann Heman for mobitz II second degree AV block  . KNEE SURGERY Right   . LEFT HEART CATH AND CORONARY ANGIOGRAPHY N/A 08/22/2018   Procedure: LEFT HEART CATH AND CORONARY ANGIOGRAPHY;  Surgeon: Troy Sine, MD;  Location: [place] CV LAB;  Service: Cardiovascular;  Laterality: N/A;  . LEFT HEART CATHETERIZATION WITH CORONARY ANGIOGRAM N/A 10/17/2012    Procedure: LEFT HEART CATHETERIZATION WITH CORONARY ANGIOGRAM;  Surgeon: Josue Hector, MD;  Location: [facility] CATH LAB;  Service: Cardiovascular;  Laterality: N/A;  . LEFT HEART CATHETERIZATION WITH CORONARY ANGIOGRAM N/A 12/17/2012   Procedure: LEFT HEART CATHETERIZATION WITH CORONARY ANGIOGRAM;  Surgeon: Peter M Martinique, MD;  Location: [facility] CATH LAB;  Service: Cardiovascular;  Laterality: N/A;  . LEFT HEART CATHETERIZATION WITH CORONARY ANGIOGRAM N/A 06/11/2013   Procedure: LEFT HEART CATHETERIZATION WITH CORONARY ANGIOGRAM;  Surgeon: Wellington Hampshire, MD;  Location: [place] CATH LAB;  Service: Cardiovascular;  Laterality: N/A;  . PERCUTANEOUS CORONARY STENT INTERVENTION (PCI-S)  10/17/2012   Procedure: PERCUTANEOUS CORONARY STENT INTERVENTION (PCI-S);  Surgeon: Josue Hector, MD;  Location: [facility] CATH LAB;  Service: Cardiovascular;;  . PERMANENT PACEMAKER INSERTION N/A 12/17/2012   Procedure: PERMANENT PACEMAKER INSERTION;  Surgeon: Thompson Grayer, MD;  Location: [facility] CATH LAB;  Service: Cardiovascular;  Laterality: N/A;  . POLYPECTOMY    . UPPER GASTROINTESTINAL ENDOSCOPY      Social History   Socioeconomic History  . Marital status: Married    Spouse name: Not on file  . Number of children: 3  . Years of education: Not on file  . Highest education level: Not on file  Occupational History  . Not on file  Tobacco Use  . Smoking status: Former Smoker    Packs/day: 2.50    Years: 40.00    Pack years: 100.00    Types: Cigarettes    Start date: 04/18/1967    Quit date: 12/20/2003    Years since quitting: 15.7  . Smokeless tobacco: Former Network engineer and Sexual Activity  . Alcohol use: Yes    Alcohol/week: 1.0 standard drinks    Types: 1 Cans of beer per week  . Drug use: Not Currently    Types: Marijuana    Comment: 30 + years ago - "crank, marjiuanna, ect."  . Sexual activity: Yes    Birth control/protection: Surgical  Other Topics Concern  . Not on file  Social History Narrative  . Not on file     Social Determinants of Health   Financial Resource Strain:   . Difficulty of Paying Living Expenses:   Food Insecurity:   . Worried About Charity fundraiser in the Last Year:   . Arboriculturist in the Last Year:   Transportation Needs:   . Film/video editor (Medical):   Marland Kitchen Lack of Transportation (Non-Medical):   Physical Activity:   . Days of Exercise per Week:   . Minutes of Exercise per Session:   Stress:   . Feeling of Stress :   Social Connections:   . Frequency of Communication with Friends and Family:   . Frequency of Social Gatherings with Friends and Family:   . Attends Religious Services:   . Active Member of Clubs or Organizations:   . Attends Archivist Meetings:   Marland Kitchen Marital Status:   Intimate Partner Violence:   . Fear of Current or Ex-Partner:   . Emotionally Abused:   Marland Kitchen Physically Abused:   . Sexually Abused:     Vitals:   08/29/19 1349  BP: 140/76  Pulse: 69  Temp: (!) 97.5 F (36.4 C)  SpO2: 94%  Weight: 235 lb (106.6 kg)  Height: 5\' 11"  (1.803 m)    Wt Readings from Last 3 Encounters:  08/29/19 235 lb (106.6 kg)  08/26/19 231 lb (104.8 kg)  06/18/19 225 lb (102.1 kg)     PHYSICAL EXAM General: NAD HEENT: Normal. Neck: No JVD, no thyromegaly. Lungs: Clear to auscultation bilaterally with normal respiratory effort. CV: Regular rate and rhythm, normal S1/S2, no [id number], 1/6 systolic murmur over RUSB. No pretibial or periankle edema.  No carotid bruit.   Abdomen: Soft, nontender, no distention.  Neurologic: Alert and oriented.  Psych: Normal affect. Skin: Normal. Musculoskeletal: No gross deformities.      Labs: Lab Results  Component Value Date/Time   K 4.1 06/10/2019 09:09 AM   BUN 13 06/10/2019 09:09 AM   CREATININE 0.98 06/10/2019 09:09 AM   CREATININE 0.95 02/24/2016 11:31  AM   ALT 20 06/10/2019 09:09 AM   TSH 2.920 12/05/2017 11:44 AM   HGB 15.7 06/10/2019 09:09 AM     Lipids: Lab Results  Component  Value Date/Time   LDLCALC 57 06/10/2019 09:09 AM   CHOL 131 06/10/2019 09:09 AM   TRIG 125 06/10/2019 09:09 AM   HDL 52 06/10/2019 09:09 AM      Prior CV studies:   The following studies were reviewed today:  Cardiac Catheterization: 08/22/2018  Prox RCA to Mid RCA lesion is 20% stenosed.  Dist Cx lesion is 85% stenosed.  Prox Cx lesion is 10% stenosed.  Ost Cx lesion is 10% stenosed.  Mid LAD lesion is 85% stenosed.  Post intervention, there is a 0% residual stenosis.  The left ventricular systolic function is normal.  LV end diastolic pressure is normal.  A stent was successfully placed.  Preserved global LV function with an EF estimated at 60 to 65%. LVEDP 15 mm.  Multivessel CAD with new 85% mid LAD stenosis; 10% ostial 10% proximal and 10% AV groove circumflex stenoses with 80% stenosis in a distal branch of an OM 2 vessel; patent RCA stent with intimal hyperplasia of approximately 20%.  Successful PCI of the mid LAD with ultimate insertion of a 2.5 x 22 mm Resolute Onyx DES stent postdilated to 2.6 mm with the 85% stenosis being reduced to 0%.  RECOMMENDATION: DAPT for minimum of 6 months but preferably longer. Medical therapy for concomitant CAD. Optimal blood pressure control with ideal blood pressure less than 120/80. Aggressive lipid-lowering therapy with target LDL less than 70.   Echocardiogram 06/20/2019:  1. Left ventricular ejection fraction, by estimation, is 60 to 65%. The  left ventricle has normal function. The left ventricle has no regional  wall motion abnormalities. There is mild left ventricular hypertrophy.  Left ventricular diastolic parameters  are consistent with Grade I diastolic dysfunction (impaired relaxation).  2. Right ventricular systolic function is normal. The right ventricular  size is normal.  3. Left atrial size was mildly dilated.  4. The mitral valve is normal in structure and function. No evidence of  mitral valve  regurgitation. No evidence of mitral stenosis.  5. The aortic valve was not well visualized. Aortic valve regurgitation  is not visualized. No aortic stenosis is present.  6. The inferior vena cava is dilated in size with >50% respiratory  variability, suggesting right atrial pressure of 8 mmHg.   ASSESSMENT AND PLAN:  1.  Dyspnea on exertion/coronary artery disease: Most recent cardiac catheterization from May 2020 reviewed above at which time he underwent mid LAD drug-eluting stent placement.  There is 85% distal left circumflex stenosis which is to be treated medically. He previously did not tolerate Brilinta as it led to shortness of breath and the symptoms resolved by switching to clopidogrel.  Continue aspirin, beta-blocker, and statin.  I will increase Imdur to 120 mg.  I will also obtain a Lexiscan Myoview.  I will check a BNP and D-dimer.  If all of the aforementioned testing is unremarkable, I would consider a chest CT and perhaps pulmonary function testing.  Symptoms began after the second dose of the Covid vaccine.  2.  Hypertension/orthostatic hypotension: Blood pressure is mildly elevated today.  Amlodipine dose was recently increased by his PCP.  3.  Hyperlipidemia: Lipids reviewed above.  Continue statin.  4.  Complete heart block: Status post pacemaker in September 2014 with Medtronic CRT-P placement in December 2015.  Normal device function.  He has a history of paroxysmal NSVT for which he takes nadolol. He is followed by Dr. Rayann Heman for this.    Disposition: Follow up 1 month virtual visit  A high level of decision making was required for increased medical complexities.    Kate Sable, M.D., F.A.C.C.

## 2019-09-10 ENCOUNTER — Encounter: Payer: Self-pay | Admitting: Internal Medicine

## 2019-09-10 ENCOUNTER — Other Ambulatory Visit: Payer: Self-pay

## 2019-09-10 ENCOUNTER — Ambulatory Visit (INDEPENDENT_AMBULATORY_CARE_PROVIDER_SITE_OTHER): Payer: BC Managed Care – PPO | Admitting: Internal Medicine

## 2019-09-10 VITALS — BP 160/88 | HR 64 | Ht 72.0 in | Wt 232.0 lb

## 2019-09-10 DIAGNOSIS — I25118 Atherosclerotic heart disease of native coronary artery with other forms of angina pectoris: Secondary | ICD-10-CM | POA: Diagnosis not present

## 2019-09-10 DIAGNOSIS — I441 Atrioventricular block, second degree: Secondary | ICD-10-CM | POA: Diagnosis not present

## 2019-09-10 DIAGNOSIS — I119 Hypertensive heart disease without heart failure: Secondary | ICD-10-CM | POA: Diagnosis not present

## 2019-09-10 DIAGNOSIS — R0600 Dyspnea, unspecified: Secondary | ICD-10-CM | POA: Diagnosis not present

## 2019-09-10 DIAGNOSIS — R0609 Other forms of dyspnea: Secondary | ICD-10-CM

## 2019-09-10 NOTE — Patient Instructions (Addendum)
Medication Instructions:  Your physician recommends that you continue on your current medications as directed. Please refer to the Current Medication list given to you today.  Labwork: None ordered.  Testing/Procedures: None ordered.  Follow-Up: Your physician wants you to follow-up in: one year with Tommye Standard, PA.   You will receive a reminder letter in the mail two months in advance. If you don't receive a letter, please call our office to schedule the follow-up appointment.  Remote monitoring is used to monitor your Pacemaker from home. This monitoring reduces the number of office visits required to check your device to one time per year. It allows [place] to keep an eye on the functioning of your device to ensure it is working properly. You are scheduled for a device check from home on 12/10/2019. You may send your transmission at any time that day. If you have a wireless device, the transmission will be sent automatically. After your physician reviews your transmission, you will receive a postcard with your next transmission date.  Any Other Special Instructions Will Be Listed Below (If Applicable).  If you need a refill on your cardiac medications before your next appointment, please call your pharmacy.

## 2019-09-10 NOTE — Progress Notes (Signed)
PCP: Kathyrn Drown, MD Primary Cardiologist: Dr Bronson Ing Primary EP:  Dr Mardee Postin [name] is a 64 y.o. male who presents today for routine electrophysiology followup.  Since last being seen in our clinic, the patient reports doing reasonably well.  He has ongoing issues with elevate BP as well as dypsnea.  He has had some chest pain.  He is following closely with Dr Bronson Ing. He has a stress test pending.  Today, he denies symptoms of palpitations,  lower extremity edema, dizziness, presyncope, or syncope.  The patient is otherwise without complaint today.   Past Medical History:  Diagnosis Date  . ADD (attention deficit disorder)    Rx-Adderall  . Anginal pain ([place])   . AV block, 3rd degree (HCC)   . Colon polyps 2011   tubular adenoma by excisional biopsy during colonoscopy  . Complete heart block [facility])    a. s/p PPM Placement in 12/2012 with Medtronic CRT-P placement in 03/2014  . Coronary artery disease    a. s/p PCI of the RCA in 2014 b. 08/2018: patent RCA stent with new mid-LAD 85% stenosis (treated with PCI/DES) and 85% distal OM2 stenosis (med management recommended)  . Degenerative disc disease, cervical   . Gastrointestinal bleeding 10/15/2017  . GERD (gastroesophageal reflux disease)   . Hypertension   . Pacemaker   . Palpitations 2003   Holter in 2003: PACs and PVCs; negative stress nuclear test in 2005; right bundle branch block; echo in 2008-mild LVH, otherwise normal; 2008-negative stress nuclear test.  . Pneumonia   . Prostatitis    prostate calcifications by CT  . Sleep apnea    questionable diagnosis   Past Surgical History:  Procedure Laterality Date  . ANTERIOR CERVICAL DECOMP/DISCECTOMY FUSION    . BACK SURGERY    . Biventricular pacemaker upgrade/ system revision  04/02/14   removal of previous atrial and ventricular leads with placement of a new MDT Consulta CRT-P system at [facility] by Dr Violet Baldy  . COLONOSCOPY    .  COLONOSCOPY W/ POLYPECTOMY  2011   tubular adenoma  . CORONARY STENT INTERVENTION N/A 08/22/2018   Procedure: CORONARY STENT INTERVENTION;  Surgeon: Troy Sine, MD;  Location: [place] CV LAB;  Service: Cardiovascular;  Laterality: N/A;  . ESOPHAGOGASTRODUODENOSCOPY     Gastritis  . INSERT / REPLACE / REMOVE PACEMAKER  12/17/2012   MDT Adapta L implanted by Dr Rayann Heman for mobitz II second degree AV block  . KNEE SURGERY Right   . LEFT HEART CATH AND CORONARY ANGIOGRAPHY N/A 08/22/2018   Procedure: LEFT HEART CATH AND CORONARY ANGIOGRAPHY;  Surgeon: Troy Sine, MD;  Location: [place] CV LAB;  Service: Cardiovascular;  Laterality: N/A;  . LEFT HEART CATHETERIZATION WITH CORONARY ANGIOGRAM N/A 10/17/2012   Procedure: LEFT HEART CATHETERIZATION WITH CORONARY ANGIOGRAM;  Surgeon: Josue Hector, MD;  Location: [facility] CATH LAB;  Service: Cardiovascular;  Laterality: N/A;  . LEFT HEART CATHETERIZATION WITH CORONARY ANGIOGRAM N/A 12/17/2012   Procedure: LEFT HEART CATHETERIZATION WITH CORONARY ANGIOGRAM;  Surgeon: Peter M Martinique, MD;  Location: [facility] CATH LAB;  Service: Cardiovascular;  Laterality: N/A;  . LEFT HEART CATHETERIZATION WITH CORONARY ANGIOGRAM N/A 06/11/2013   Procedure: LEFT HEART CATHETERIZATION WITH CORONARY ANGIOGRAM;  Surgeon: Wellington Hampshire, MD;  Location: [place] CATH LAB;  Service: Cardiovascular;  Laterality: N/A;  . PERCUTANEOUS CORONARY STENT INTERVENTION (PCI-S)  10/17/2012   Procedure: PERCUTANEOUS CORONARY STENT INTERVENTION (PCI-S);  Surgeon: Josue Hector,  MD;  Location: [place] CATH LAB;  Service: Cardiovascular;;  . PERMANENT PACEMAKER INSERTION N/A 12/17/2012   Procedure: PERMANENT PACEMAKER INSERTION;  Surgeon: Thompson Grayer, MD;  Location: [facility] CATH LAB;  Service: Cardiovascular;  Laterality: N/A;  . POLYPECTOMY    . UPPER GASTROINTESTINAL ENDOSCOPY      ROS- all systems are reviewed and negative except as per HPI above  Current Outpatient Medications  Medication Sig Dispense  Refill  . albuterol (VENTOLIN HFA) 108 (90 Base) MCG/ACT inhaler Inhale 2 puffs into the lungs every 4 (four) hours as needed for wheezing. 18 g 2  . ALPRAZolam (XANAX) 0.5 MG tablet TAKE 1 TABLET(0.5 MG) BY MOUTH TWICE DAILY AS NEEDED FOR ANXIETY OR SLEEP 20 tablet 0  . amLODipine (NORVASC) 5 MG tablet TAKE 1 TABLET(2.5 MG) BY MOUTH DAILY 30 tablet 5  . amLODipine (NORVASC) 5 MG tablet Take 1 tablet (5 mg total) by mouth daily. 90 tablet 1  . aspirin 81 MG chewable tablet Chew 81 mg by mouth daily.    . clopidogrel (PLAVIX) 75 MG tablet The day after you stop Brilinta, Take Plavix 300 mg ( 4 tablets) one time, then take 75 mg daily thereafter 90 tablet 3  . cyclobenzaprine (FLEXERIL) 5 MG tablet Take 5 mg by mouth at bedtime as needed for muscle spasms.    Marland Kitchen esomeprazole (NEXIUM) 40 MG capsule TAKE 1 CAPSULE BY MOUTH EVERY DAY 30 capsule 2  . gabapentin (NEURONTIN) 100 MG capsule TAKE 1 CAPSULE(100 MG) BY MOUTH THREE TIMES DAILY 90 capsule 2  . hydrALAZINE (APRESOLINE) 25 MG tablet Take two tablets in the morning, one tablet midday and two in the evening 450 tablet 1  . HYDROmorphone (DILAUDID) 4 MG tablet Take 1 tablet (4 mg total) by mouth every 6 (six) hours as needed for severe pain. 20 tablet 0  . isosorbide mononitrate (IMDUR) 120 MG 24 hr tablet Take 1 tablet (120 mg total) by mouth daily. 90 tablet 3  . losartan (COZAAR) 100 MG tablet TAKE 1 TABLET(100 MG) BY MOUTH DAILY 90 tablet 0  . nadolol (CORGARD) 80 MG tablet Take 1 tablet (80 mg total) by mouth daily. 30 tablet 1  . nitroGLYCERIN (NITROSTAT) 0.4 MG SL tablet Place 1 tablet (0.4 mg total) under the tongue every 5 (five) minutes as needed for chest pain (MAX 3 TABLETS). 25 tablet 3  . promethazine (PHENERGAN) 25 MG tablet TAKE 1 TABLET BY MOUTH TWICE DAILY AS NEEDED FOR NAUSEA 30 tablet 5  . rosuvastatin (CRESTOR) 20 MG tablet TAKE 1 TABLET(20 MG) BY MOUTH DAILY 30 tablet 4  . sertraline (ZOLOFT) 100 MG tablet Take one tablet po  daily 90 tablet 1  . tamsulosin (FLOMAX) 0.4 MG CAPS capsule Take one capsule po daily 60 capsule 2   No current facility-administered medications for this visit.    Physical Exam: Vitals:   09/10/19 1555  BP: (!) 160/88  Pulse: 64  SpO2: 95%  Weight: 232 lb (105.2 kg)  Height: 6' (1.829 m)    GEN- The patient is well appearing, alert and oriented x 3 today.   Head- normocephalic, atraumatic Eyes-  Sclera clear, conjunctiva pink Ears- hearing intact Oropharynx- clear Lungs- Clear to ausculation bilaterally, normal work of breathing Chest- pacemaker pocket is well healed Heart- Regular rate and rhythm, no murmurs, rubs or gallops, PMI not laterally displaced GI- soft, NT, ND, + BS Extremities- no clubbing, cyanosis, or edema  Pacemakeinterrogation- reviewed in detail today,  See PACEART reportr  ekg  tracing ordered today is personally reviewed and shows sinus with BiV pacing  Assessment and Plan:  1. Complete heart block Normal pacemaker function See Pace Art report No changes today he is device dependant today  2. CAD He is having dypsnea.  Dr Bronson Ing has workup in place Stress test is pending He is clinically stable for outpatient testing at this time  3. Hypertensive cardiovascular disease elevated No change required today Consider addition of spironolactone  4. HL Stable No change required today   Risks, benefits and potential toxicities for medications prescribed and/or refilled reviewed with patient today.   Return to see EP APP in a year  Thompson Grayer MD, [facility] 09/10/2019 4:06 PM

## 2019-09-11 ENCOUNTER — Telehealth: Payer: Self-pay

## 2019-09-11 NOTE — Telephone Encounter (Signed)
Left message for patient to remind of missed remote transmission.  

## 2019-09-12 NOTE — Addendum Note (Signed)
Addended by: Rose Phi on: 09/12/2019 12:44 PM   Modules accepted: Orders

## 2019-09-13 ENCOUNTER — Other Ambulatory Visit: Payer: Self-pay | Admitting: Cardiovascular Disease

## 2019-09-13 ENCOUNTER — Other Ambulatory Visit: Payer: Self-pay | Admitting: Family Medicine

## 2019-09-17 ENCOUNTER — Other Ambulatory Visit: Payer: Self-pay

## 2019-09-17 ENCOUNTER — Encounter (HOSPITAL_BASED_OUTPATIENT_CLINIC_OR_DEPARTMENT_OTHER)
Admission: RE | Admit: 2019-09-17 | Discharge: 2019-09-17 | Disposition: A | Discharge status: Home / Self Care | Payer: Medicare Other | Source: Ambulatory Visit | Attending: Cardiovascular Disease | Admitting: Cardiovascular Disease

## 2019-09-17 ENCOUNTER — Encounter (HOSPITAL_COMMUNITY)
Admission: RE | Admit: 2019-09-17 | Discharge: 2019-09-17 | Disposition: A | Discharge status: Home / Self Care | Payer: Medicare Other | Source: Ambulatory Visit | Attending: Cardiovascular Disease | Admitting: Cardiovascular Disease

## 2019-09-17 DIAGNOSIS — R0600 Dyspnea, unspecified: Secondary | ICD-10-CM

## 2019-09-17 DIAGNOSIS — I10 Essential (primary) hypertension: Secondary | ICD-10-CM | POA: Insufficient documentation

## 2019-09-17 DIAGNOSIS — I25118 Atherosclerotic heart disease of native coronary artery with other forms of angina pectoris: Secondary | ICD-10-CM

## 2019-09-17 DIAGNOSIS — R0609 Other forms of dyspnea: Secondary | ICD-10-CM

## 2019-09-17 LAB — NM MYOCAR MULTI W/SPECT W/WALL MOTION / EF
LV dias vol: 114 mL (ref 62–150)
LV sys vol: 65 mL
Peak HR: 81 {beats}/min
RATE: 0.36
Rest HR: 60 {beats}/min
SDS: 3
SRS: 0
SSS: 3
TID: 0.98

## 2019-09-17 MED ORDER — REGADENOSON 0.4 MG/5ML IV SOLN
INTRAVENOUS | Status: AC
  Administered 2019-09-17: 0.4 mg via INTRAVENOUS
  Filled 2019-09-17: qty 5

## 2019-09-17 MED ORDER — TECHNETIUM TC 99M TETROFOSMIN IV KIT
30.0000 | PACK | Freq: Once | INTRAVENOUS | Status: AC | PRN
  Administered 2019-09-17: 30 via INTRAVENOUS

## 2019-09-17 MED ORDER — TECHNETIUM TC 99M TETROFOSMIN IV KIT
10.0000 | PACK | Freq: Once | INTRAVENOUS | Status: AC | PRN
  Administered 2019-09-17: 11 via INTRAVENOUS

## 2019-09-17 MED ORDER — SODIUM CHLORIDE FLUSH 0.9 % IV SOLN
INTRAVENOUS | Status: AC
  Administered 2019-09-17: 10 mL via INTRAVENOUS
  Filled 2019-09-17: qty 10

## 2019-09-26 ENCOUNTER — Ambulatory Visit (INDEPENDENT_AMBULATORY_CARE_PROVIDER_SITE_OTHER): Payer: Medicare Other | Admitting: Family Medicine

## 2019-09-26 ENCOUNTER — Other Ambulatory Visit: Payer: Self-pay

## 2019-09-26 ENCOUNTER — Ambulatory Visit (INDEPENDENT_AMBULATORY_CARE_PROVIDER_SITE_OTHER): Payer: Medicare Other | Admitting: *Deleted

## 2019-09-26 ENCOUNTER — Encounter: Payer: Self-pay | Admitting: Family Medicine

## 2019-09-26 VITALS — BP 148/92 | Temp 97.8°F | Wt 232.0 lb

## 2019-09-26 DIAGNOSIS — I441 Atrioventricular block, second degree: Secondary | ICD-10-CM

## 2019-09-26 DIAGNOSIS — M5431 Sciatica, right side: Secondary | ICD-10-CM | POA: Diagnosis not present

## 2019-09-26 DIAGNOSIS — M5432 Sciatica, left side: Secondary | ICD-10-CM

## 2019-09-26 MED ORDER — PREDNISONE 20 MG PO TABS
ORAL_TABLET | ORAL | 0 refills | Status: DC
Start: 2019-09-26 — End: 2019-10-30

## 2019-09-26 MED ORDER — HYDROMORPHONE HCL 4 MG PO TABS: 4.0000 mg | ORAL_TABLET | Freq: Four times a day (QID) | ORAL | 0 refills | Status: DC | PRN

## 2019-09-26 NOTE — Progress Notes (Signed)
   Subjective:    Patient ID: KIJUAN GALLICCHIO, male    DOB: 1955-10-11, 64 y.o.   MRN: [id number]  Back Pain This is a new problem. Episode onset: 5 days ago picking up heavy objects. The problem occurs constantly. The symptoms are aggravated by bending, position, sitting and twisting. He has tried heat, home exercises and muscle relaxant (massage, Dilaudid, tylenol and flexeril ) for the symptoms. The treatment provided no relief.  lifting a massage chair Constant pain radoiates down the legs Doing stretches  Patient also has some concerns of injection site of 2nd covid vaccine 1 month ago, left arm constantly sore.    Review of Systems  Musculoskeletal: Positive for back pain.   Relates severe back pain severe leg pain denies abdominal pain chest pain    Objective:   Physical Exam  Tender in the right lower back with some radiation into the leg on the back aspect side aspect consistent with sciatica.  Positive straight leg raise.  Able to walk on heels and toes.      Assessment & Plan:  Significant low back injury given that he is also having significant troubles with pain and sciatica there is concern of impingement of the nerve patient would benefit from stretching exercises prednisone taper and continued use of pain medicine caution drowsiness certainly if he has any setbacks he is to let us know he is to give [place] update next week may benefit from physical therapy may end up needing to have CAT scan cannot do MRI because of pacemaker

## 2019-09-28 ENCOUNTER — Encounter: Payer: Self-pay | Admitting: Family Medicine

## 2019-09-28 LAB — CUP PACEART REMOTE DEVICE CHECK
Battery Remaining Longevity: 47 mo
Battery Voltage: 2.98 V
Brady Statistic AP VP Percent: 24.72 %
Brady Statistic AP VS Percent: 0 %
Brady Statistic AS VP Percent: 75.27 %
Brady Statistic AS VS Percent: 0.01 %
Brady Statistic RA Percent Paced: 24.71 %
Brady Statistic RV Percent Paced: 99.95 %
Date Time Interrogation Session: 20210611134512
Implantable Lead Implant Date: 20151217
Implantable Lead Implant Date: 20151217
Implantable Lead Implant Date: 20151217
Implantable Lead Location: 753858
Implantable Lead Location: 753859
Implantable Lead Location: 753860
Implantable Lead Model: 4196
Implantable Lead Model: 5076
Implantable Lead Model: 5076
Implantable Pulse Generator Implant Date: 20151217
Lead Channel Impedance Value: 1007 Ohm
Lead Channel Impedance Value: 380 Ohm
Lead Channel Impedance Value: 399 Ohm
Lead Channel Impedance Value: 437 Ohm
Lead Channel Impedance Value: 437 Ohm
Lead Channel Impedance Value: 608 Ohm
Lead Channel Impedance Value: 627 Ohm
Lead Channel Impedance Value: 665 Ohm
Lead Channel Impedance Value: 703 Ohm
Lead Channel Pacing Threshold Amplitude: 0.75 V
Lead Channel Pacing Threshold Amplitude: 0.75 V
Lead Channel Pacing Threshold Amplitude: 1 V
Lead Channel Pacing Threshold Pulse Width: 0.4 ms
Lead Channel Pacing Threshold Pulse Width: 0.4 ms
Lead Channel Pacing Threshold Pulse Width: 0.4 ms
Lead Channel Sensing Intrinsic Amplitude: 0.75 mV
Lead Channel Sensing Intrinsic Amplitude: 0.75 mV
Lead Channel Sensing Intrinsic Amplitude: 18.25 mV
Lead Channel Sensing Intrinsic Amplitude: 18.25 mV
Lead Channel Setting Pacing Amplitude: 2 V
Lead Channel Setting Pacing Amplitude: 2.25 V
Lead Channel Setting Pacing Amplitude: 2.5 V
Lead Channel Setting Pacing Pulse Width: 0.4 ms
Lead Channel Setting Pacing Pulse Width: 0.4 ms
Lead Channel Setting Sensing Sensitivity: 0.9 mV

## 2019-09-29 MED ORDER — TAMSULOSIN HCL 0.4 MG PO CAPS: ORAL_CAPSULE | ORAL | 1 refills | Status: DC

## 2019-09-29 NOTE — Progress Notes (Signed)
Remote pacemaker transmission.   

## 2019-09-29 NOTE — Addendum Note (Signed)
Addended by: Vicente Males on: 09/29/2019 10:34 AM   Modules accepted: Orders

## 2019-09-29 NOTE — Telephone Encounter (Signed)
According to epic patient on 1 tablet daily   may have 90, 1 daily, 1 refill, tamsulosin 0.4 mg

## 2019-10-16 ENCOUNTER — Telehealth: Payer: BC Managed Care – PPO | Admitting: Cardiovascular Disease

## 2019-10-22 LAB — CUP PACEART INCLINIC DEVICE CHECK
Battery Remaining Longevity: 46 mo
Battery Voltage: 2.98 V
Brady Statistic AP VP Percent: 29.42 %
Brady Statistic AP VS Percent: 0 %
Brady Statistic AS VP Percent: 70.56 %
Brady Statistic AS VS Percent: 0.02 %
Brady Statistic RA Percent Paced: 29.36 %
Brady Statistic RV Percent Paced: 99.87 %
Date Time Interrogation Session: 20210526170256
Implantable Lead Implant Date: 20151217
Implantable Lead Implant Date: 20151217
Implantable Lead Implant Date: 20151217
Implantable Lead Location: 753858
Implantable Lead Location: 753859
Implantable Lead Location: 753860
Implantable Lead Model: 4196
Implantable Lead Model: 5076
Implantable Lead Model: 5076
Implantable Pulse Generator Implant Date: 20151217
Lead Channel Impedance Value: 1007 Ohm
Lead Channel Impedance Value: 399 Ohm
Lead Channel Impedance Value: 418 Ohm
Lead Channel Impedance Value: 437 Ohm
Lead Channel Impedance Value: 456 Ohm
Lead Channel Impedance Value: 551 Ohm
Lead Channel Impedance Value: 627 Ohm
Lead Channel Impedance Value: 627 Ohm
Lead Channel Impedance Value: 741 Ohm
Lead Channel Pacing Threshold Amplitude: 0.75 V
Lead Channel Pacing Threshold Amplitude: 0.75 V
Lead Channel Pacing Threshold Amplitude: 1 V
Lead Channel Pacing Threshold Pulse Width: 0.4 ms
Lead Channel Pacing Threshold Pulse Width: 0.4 ms
Lead Channel Pacing Threshold Pulse Width: 0.4 ms
Lead Channel Sensing Intrinsic Amplitude: 0.75 mV
Lead Channel Sensing Intrinsic Amplitude: 0.875 mV
Lead Channel Sensing Intrinsic Amplitude: 18.25 mV
Lead Channel Sensing Intrinsic Amplitude: 18.25 mV
Lead Channel Setting Pacing Amplitude: 2 V
Lead Channel Setting Pacing Amplitude: 2.25 V
Lead Channel Setting Pacing Amplitude: 2.5 V
Lead Channel Setting Pacing Pulse Width: 0.4 ms
Lead Channel Setting Pacing Pulse Width: 0.4 ms
Lead Channel Setting Sensing Sensitivity: 0.9 mV

## 2019-10-27 IMAGING — DX DG CERVICAL SPINE COMPLETE 4+V
6 series · 6 of 6 positions shown · non-contrast
Comparison: None.

CLINICAL DATA: 61-year-old male with left hip pain extending to
left shoulder for the past year. No known injury. Cervical spine
surgery over 10 years ago. Initial encounter.

EXAM:
CERVICAL SPINE - COMPLETE 4+ VIEW

[c-spine lat]
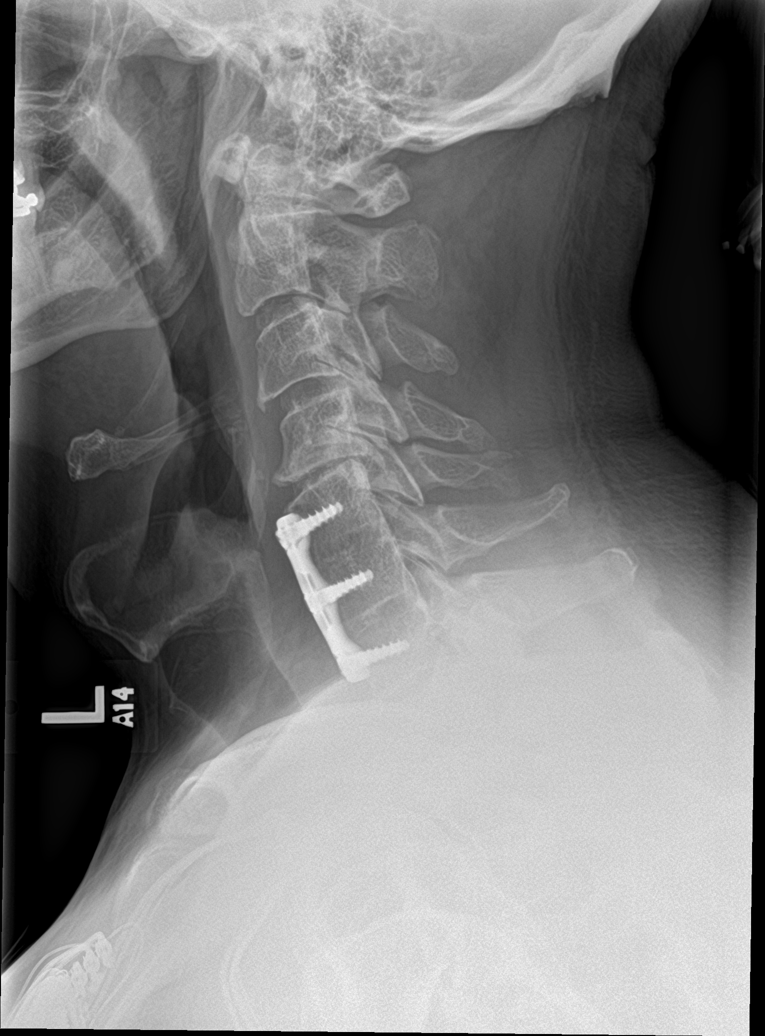

[c-spine obl (1 of 2)]
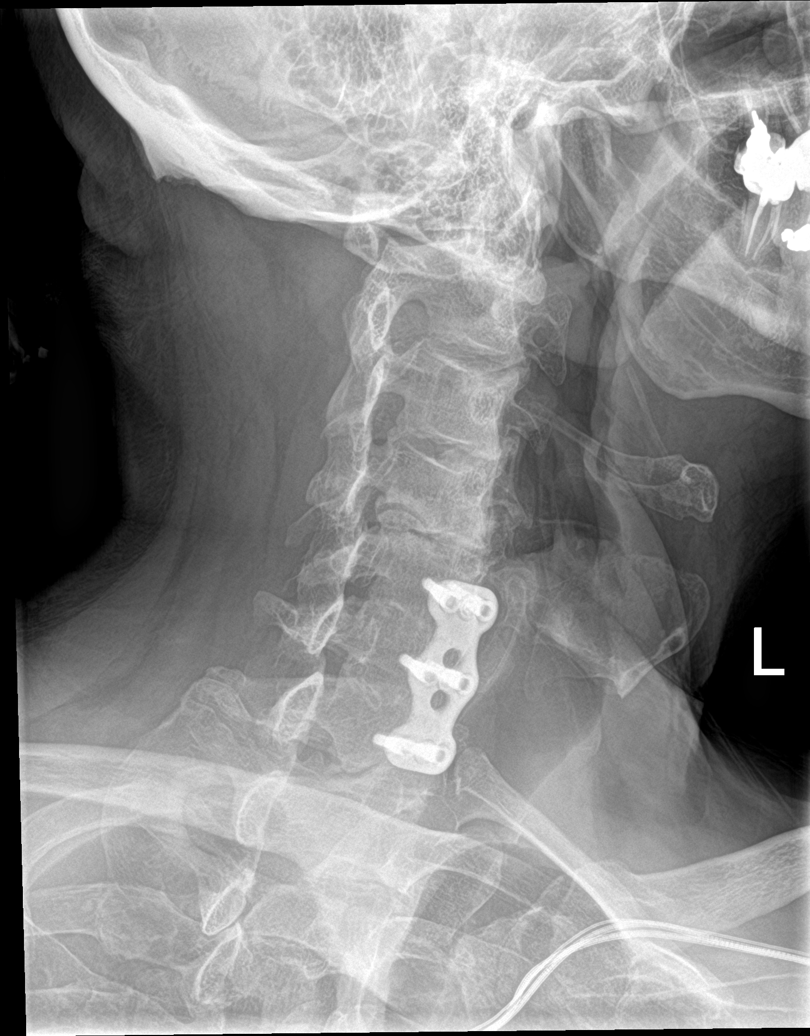

[c-spine obl (2 of 2)]
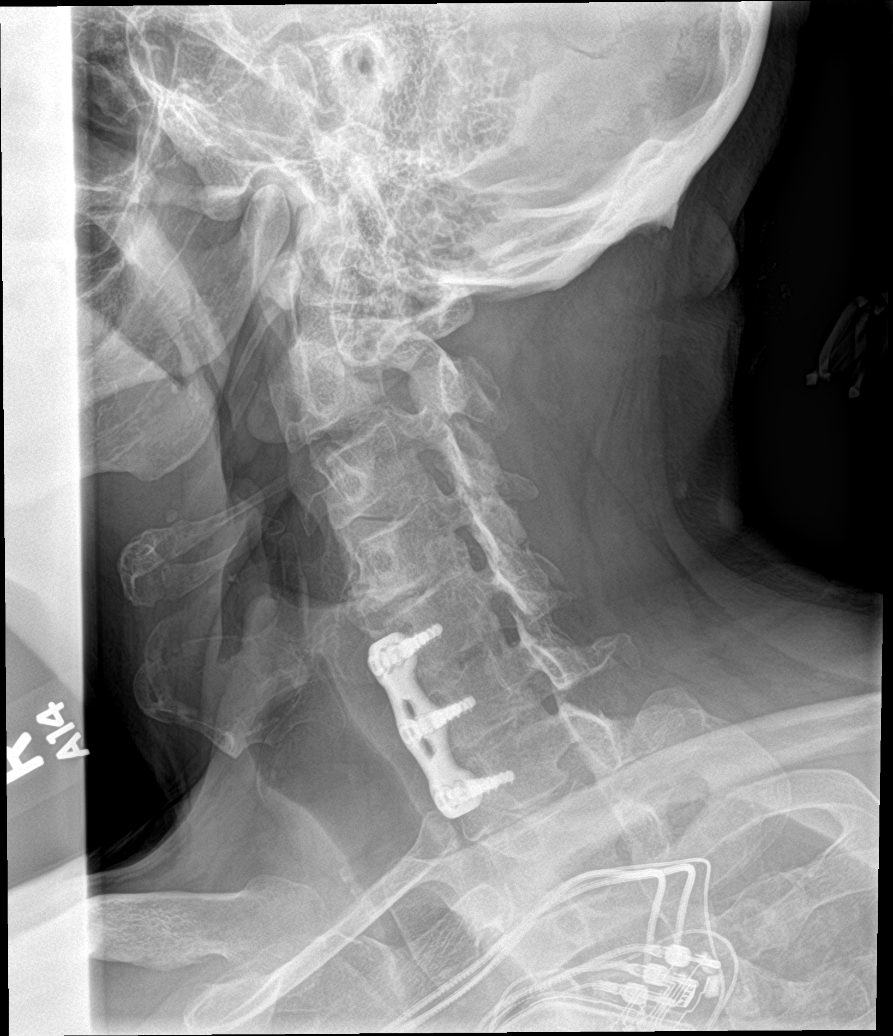

[c-spine ap]
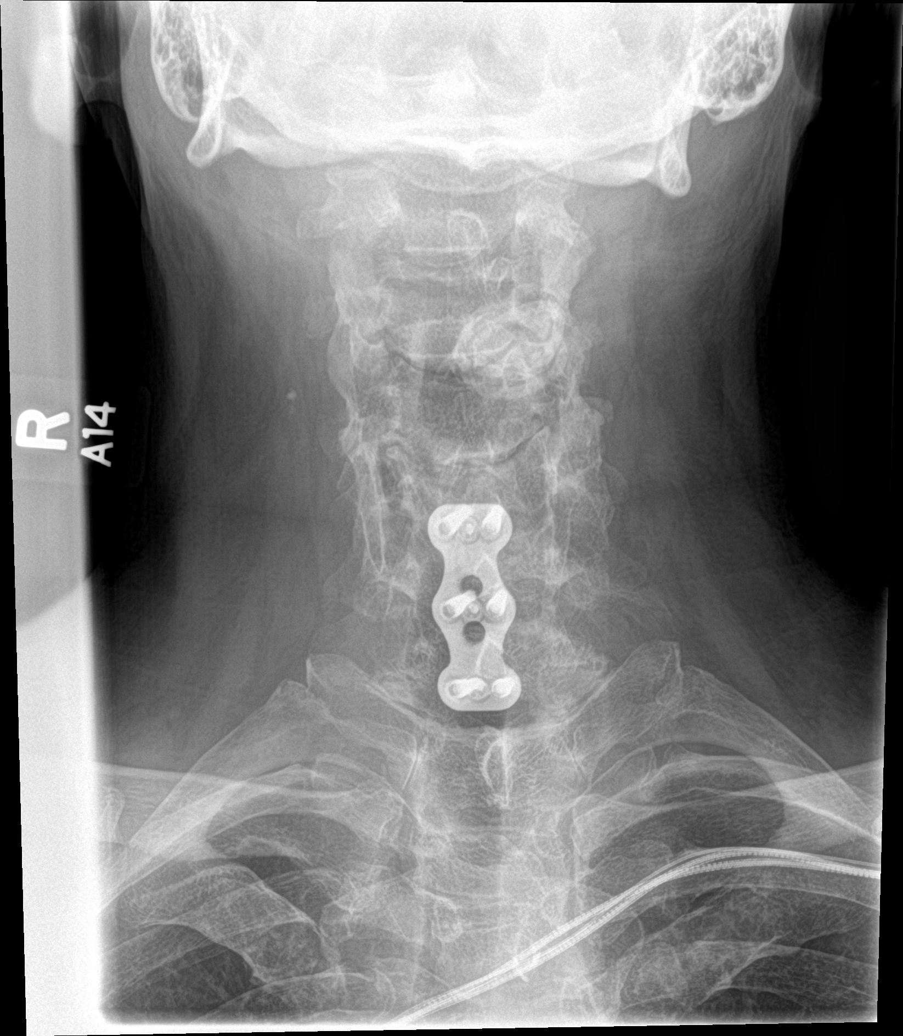

[c-spine open mouth]
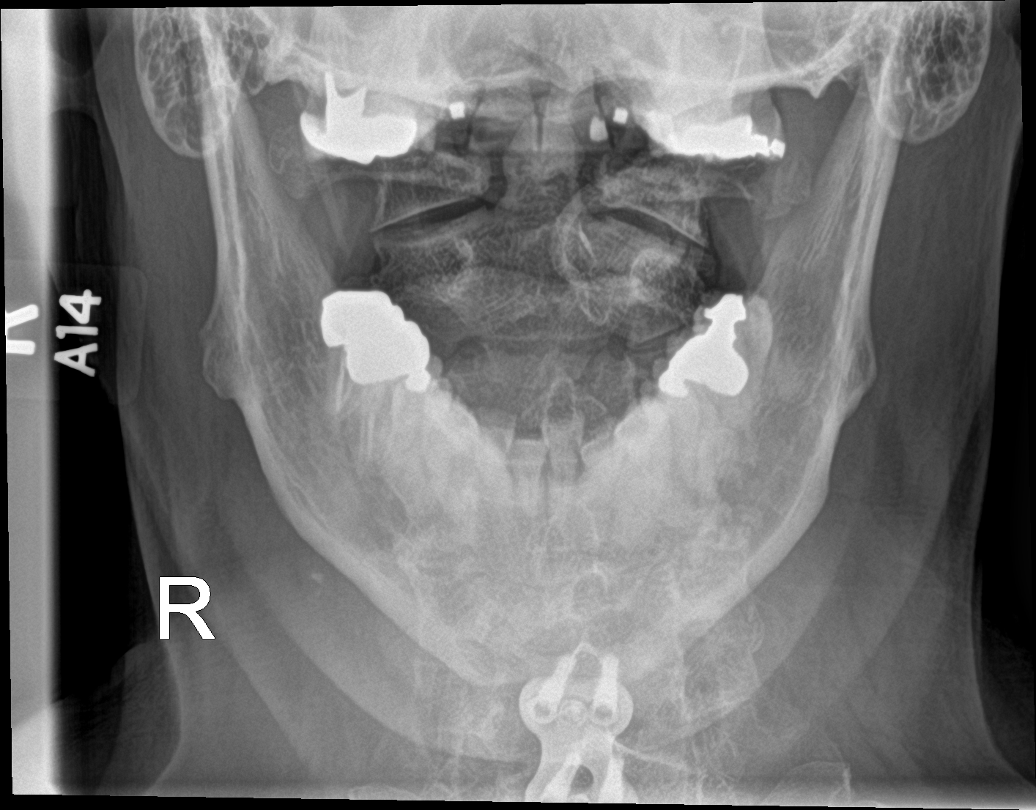

[c-spine swimmers trauma]
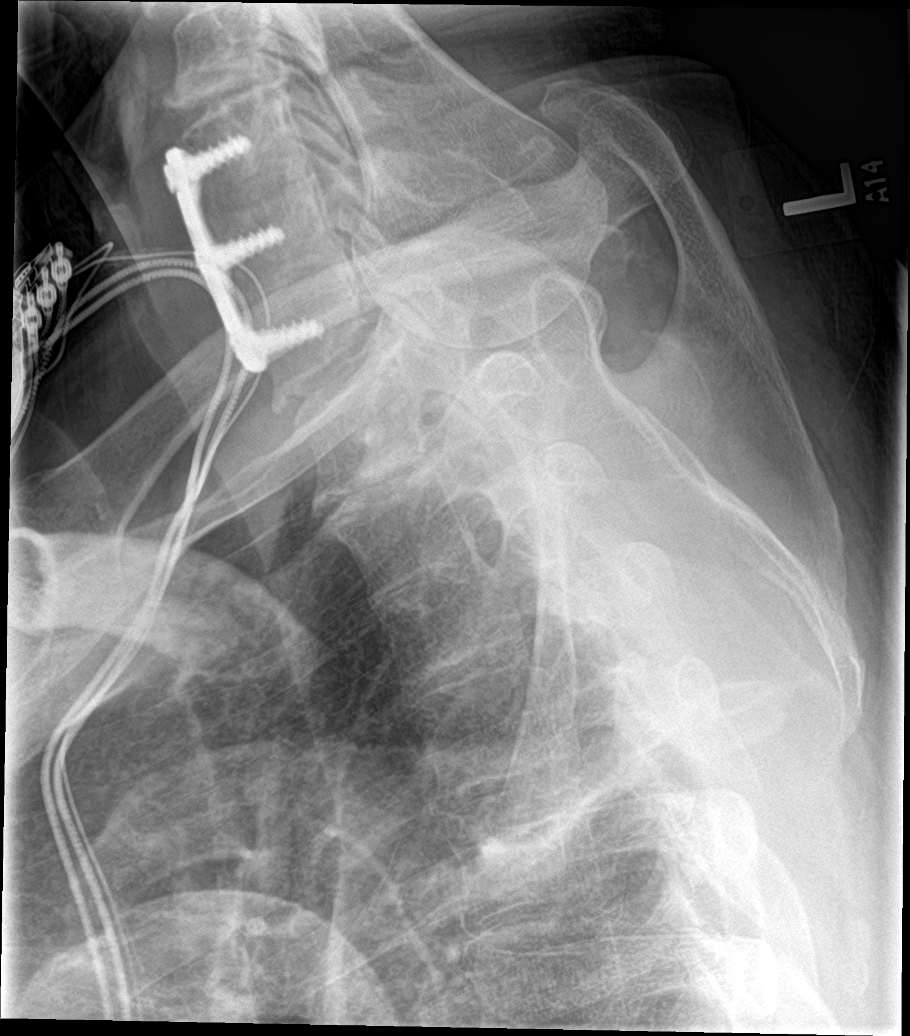

[6 of 6 positions shown; findings below may reference images not displayed]

FINDINGS: Prior fusion with anterior plate and screws C5-6 and C6-7 with solid
bony fusion across the disc space.

Moderate C4-5 disc degeneration, disc space narrowing and osteophyte
formation with minimal retrolisthesis C4. Uncinate hypertrophy
greater on the right with right greater than left mild foraminal
narrowing.

Mild C7-T1 and T1-2 disc degeneration with osteophyte.

Pacemaker leads noted.  No lung apical lesion identified.

Right carotid bifurcation tiny calcification.
IMPRESSION: Fusion C5-C7.

Moderate C4-5 disc degeneration, disc space narrowing and osteophyte
formation with minimal retrolisthesis C4. Uncinate hypertrophy
greater on the right with right greater than left mild foraminal
narrowing.

Mild C7-T1 and T1-2 disc degeneration with osteophyte.

## 2019-10-30 ENCOUNTER — Encounter: Payer: Self-pay | Admitting: Student

## 2019-10-30 ENCOUNTER — Telehealth (INDEPENDENT_AMBULATORY_CARE_PROVIDER_SITE_OTHER): Payer: Medicare Other | Admitting: Student

## 2019-10-30 ENCOUNTER — Other Ambulatory Visit: Payer: Self-pay

## 2019-10-30 VITALS — BP 140/68 | HR 68 | Ht 72.0 in | Wt 230.0 lb

## 2019-10-30 DIAGNOSIS — I10 Essential (primary) hypertension: Secondary | ICD-10-CM

## 2019-10-30 DIAGNOSIS — I441 Atrioventricular block, second degree: Secondary | ICD-10-CM

## 2019-10-30 DIAGNOSIS — E785 Hyperlipidemia, unspecified: Secondary | ICD-10-CM | POA: Diagnosis not present

## 2019-10-30 DIAGNOSIS — I25118 Atherosclerotic heart disease of native coronary artery with other forms of angina pectoris: Secondary | ICD-10-CM

## 2019-10-30 MED ORDER — AMLODIPINE BESYLATE 5 MG PO TABS
5.0000 mg | ORAL_TABLET | Freq: Every day | ORAL | 3 refills | Status: DC
Start: 2019-10-30 — End: 2020-02-03

## 2019-10-30 NOTE — Progress Notes (Addendum)
Virtual Visit via Video Note   This visit type was conducted due to national recommendations for restrictions regarding the COVID-19 Pandemic (e.g. social distancing) in an effort to limit this patient's exposure and mitigate transmission in our community.  Due to his co-morbid illnesses, this patient is at least at moderate risk for complications without adequate follow up.  This format is felt to be most appropriate for this patient at this time.  All issues noted in this document were discussed and addressed.  A limited physical exam was performed with this format.  Please refer to the patient's chart for his consent to telehealth for [facility].  The patient was identified using 2 identifiers.  Date:  10/30/2019   ID:  [name], DOB 1955-10-06, MRN [id number]  Patient Location: Home Provider Location: Office/Clinic  PCP:  Kathyrn Drown, MD  Cardiologist:  Kate Sable, MD  Electrophysiologist:  Thompson Grayer, MD   Evaluation Performed:  Follow-Up Visit  Chief Complaint:  77-month visit  History of Present Illness:    [name] is a 64 y.o. male with past medical history of CAD (s/pPCI of the RCA in 2014, DES to mid-LAD in 08/2018), CHB (s/p PPM Placement in 12/2012 with Medtronic CRT-P placement in 03/2014), HTN, HLD, and GERD who presents for a 1-month follow-up telehealth visit.  He had a phone visit with Dr. Bronson Ing in 06/2019 and reported occasional palpitations and brief episodes of chest discomfort but denied any symptoms resembling his prior angina. He had recently been evaluated by his PCP and found to have orthostatic hypotension, therefore Amlodipine and Hydralazine had been reduced. Was encouraged to increase his water consumption to 64 ounces a day and also reduce alcohol use. A follow-up echocardiogram was recommended to assess cardiac structure and function given his episodes of chest discomfort and dyspnea. His echocardiogram showed a  preserved EF of 60 to 65% with no regional wall motion abnormalities. He did have grade 1 diastolic dysfunction. At the time of his results, he reported still having episodes of dyspnea and chest discomfort, therefore Imdur was increased to 90 mg daily. He did follow-up again with Dr. Bronson Ing in 08/2019 and reported exertional dyspnea and intermittent episodes of chest pain. Imdur was further titrated to 120 mg daily and a Lexiscan Myoview along with BNP and D-dimer were recommended as well. D-dimer and BNP were normal. Stress testing showed prior small apical myocardial infarction with mild peri-infarct ischemia but felt to be very small and likely not the cause of his dyspnea. Follow-up was therefore recommended.   In talking with the patient and his wife today, he reports continuing to have intermittent episodes of dyspnea on exertion. Says he can walk around his house some days without symptoms but then occasionally has dyspnea when walking from room to room. They were previously walking 3 to 4 miles a day prior to the warmer weather and he says he could walk for a mile and would have some chest discomfort but then symptoms resolved and he could walk for an additional 2-3 miles. He has noticed an improvement in his chest pain since Imdur was titrated at the time of his last visit.  He reports overall feeling fatigued. Denies any recurrent dizziness or presyncope as he was experiencing with hypotension earlier this year. Since his medications were reduced, BP has been elevated with SBP typically in the 150's to 160's. At 140/68 today. He is interested in resuming the Keto diet as he had  successful weight loss with this in the past but triglycerides became elevated.   The patient does not have symptoms concerning for COVID-19 infection (fever, chills, cough, or new shortness of breath).    Past Medical History:  Diagnosis Date  . ADD (attention deficit disorder)    Rx-Adderall  . Anginal pain ([place])    . AV block, 3rd degree (HCC)   . Colon polyps 2011   tubular adenoma by excisional biopsy during colonoscopy  . Complete heart block [facility])    a. s/p PPM Placement in 12/2012 with Medtronic CRT-P placement in 03/2014  . Coronary artery disease    a. s/p PCI of the RCA in 2014 b. 08/2018: patent RCA stent with new mid-LAD 85% stenosis (treated with PCI/DES) and 85% distal OM2 stenosis (med management recommended)  . Degenerative disc disease, cervical   . Gastrointestinal bleeding 10/15/2017  . GERD (gastroesophageal reflux disease)   . Hypertension   . Pacemaker   . Palpitations 2003   Holter in 2003: PACs and PVCs; negative stress nuclear test in 2005; right bundle branch block; echo in 2008-mild LVH, otherwise normal; 2008-negative stress nuclear test.  . Pneumonia   . Prostatitis    prostate calcifications by CT  . Sleep apnea    questionable diagnosis   Past Surgical History:  Procedure Laterality Date  . ANTERIOR CERVICAL DECOMP/DISCECTOMY FUSION    . BACK SURGERY    . Biventricular pacemaker upgrade/ system revision  04/02/14   removal of previous atrial and ventricular leads with placement of a new MDT Consulta CRT-P system at [facility] by Dr Violet Baldy  . COLONOSCOPY    . COLONOSCOPY W/ POLYPECTOMY  2011   tubular adenoma  . CORONARY STENT INTERVENTION N/A 08/22/2018   Procedure: CORONARY STENT INTERVENTION;  Surgeon: Troy Sine, MD;  Location: [place] CV LAB;  Service: Cardiovascular;  Laterality: N/A;  . ESOPHAGOGASTRODUODENOSCOPY     Gastritis  . INSERT / REPLACE / REMOVE PACEMAKER  12/17/2012   MDT Adapta L implanted by Dr Rayann Heman for mobitz II second degree AV block  . KNEE SURGERY Right   . LEFT HEART CATH AND CORONARY ANGIOGRAPHY N/A 08/22/2018   Procedure: LEFT HEART CATH AND CORONARY ANGIOGRAPHY;  Surgeon: Troy Sine, MD;  Location: [place] CV LAB;  Service: Cardiovascular;  Laterality: N/A;  . LEFT HEART CATHETERIZATION WITH CORONARY  ANGIOGRAM N/A 10/17/2012   Procedure: LEFT HEART CATHETERIZATION WITH CORONARY ANGIOGRAM;  Surgeon: Josue Hector, MD;  Location: [facility] CATH LAB;  Service: Cardiovascular;  Laterality: N/A;  . LEFT HEART CATHETERIZATION WITH CORONARY ANGIOGRAM N/A 12/17/2012   Procedure: LEFT HEART CATHETERIZATION WITH CORONARY ANGIOGRAM;  Surgeon: Peter M Martinique, MD;  Location: [facility] CATH LAB;  Service: Cardiovascular;  Laterality: N/A;  . LEFT HEART CATHETERIZATION WITH CORONARY ANGIOGRAM N/A 06/11/2013   Procedure: LEFT HEART CATHETERIZATION WITH CORONARY ANGIOGRAM;  Surgeon: Wellington Hampshire, MD;  Location: [place] CATH LAB;  Service: Cardiovascular;  Laterality: N/A;  . PERCUTANEOUS CORONARY STENT INTERVENTION (PCI-S)  10/17/2012   Procedure: PERCUTANEOUS CORONARY STENT INTERVENTION (PCI-S);  Surgeon: Josue Hector, MD;  Location: [facility] CATH LAB;  Service: Cardiovascular;;  . PERMANENT PACEMAKER INSERTION N/A 12/17/2012   Procedure: PERMANENT PACEMAKER INSERTION;  Surgeon: Thompson Grayer, MD;  Location: [facility] CATH LAB;  Service: Cardiovascular;  Laterality: N/A;  . POLYPECTOMY    . UPPER GASTROINTESTINAL ENDOSCOPY       Current Meds  Medication Sig  . albuterol (VENTOLIN HFA) 108 (90 Base) MCG/ACT  inhaler Inhale 2 puffs into the lungs every 4 (four) hours as needed for wheezing.  Marland Kitchen ALPRAZolam (XANAX) 0.5 MG tablet TAKE 1 TABLET(0.5 MG) BY MOUTH TWICE DAILY AS NEEDED FOR ANXIETY OR SLEEP  . aspirin 81 MG chewable tablet Chew 81 mg by mouth daily.  . clopidogrel (PLAVIX) 75 MG tablet THE DAY AFTER STOPPING BRILINTA, TAKE 4 TABLETS BY MOUTH ONCE, THEN BEGIN TAKING ONE TABLET ONCE DAILY THEREAFTER  . cyclobenzaprine (FLEXERIL) 5 MG tablet Take 5 mg by mouth at bedtime as needed for muscle spasms.  Marland Kitchen esomeprazole (NEXIUM) 40 MG capsule TAKE 1 CAPSULE BY MOUTH EVERY DAY  . hydrALAZINE (APRESOLINE) 25 MG tablet Take 25 mg by mouth as directed. 25 mg mid day  . hydrALAZINE (APRESOLINE) 50 MG tablet Take 50 mg by mouth in the morning and  at bedtime.  Marland Kitchen HYDROmorphone (DILAUDID) 4 MG tablet Take 1 tablet (4 mg total) by mouth every 6 (six) hours as needed for severe pain.  . isosorbide mononitrate (IMDUR) 120 MG 24 hr tablet Take 1 tablet (120 mg total) by mouth daily.  Marland Kitchen losartan (COZAAR) 100 MG tablet TAKE 1 TABLET(100 MG) BY MOUTH DAILY  . nadolol (CORGARD) 80 MG tablet Take 1 tablet (80 mg total) by mouth daily.  . nitroGLYCERIN (NITROSTAT) 0.4 MG SL tablet Place 1 tablet (0.4 mg total) under the tongue every 5 (five) minutes as needed for chest pain (MAX 3 TABLETS).  . promethazine (PHENERGAN) 25 MG tablet TAKE 1 TABLET BY MOUTH TWICE DAILY AS NEEDED FOR NAUSEA  . rosuvastatin (CRESTOR) 20 MG tablet TAKE 1 TABLET(20 MG) BY MOUTH DAILY  . tamsulosin (FLOMAX) 0.4 MG CAPS capsule Take one capsule po daily  . [DISCONTINUED] amLODipine (NORVASC) 5 MG tablet TAKE 1 TABLET(2.5 MG) BY MOUTH DAILY     Allergies:   Morphine and related, Lasix [furosemide], and Latex   Social History   Tobacco Use  . Smoking status: Former Smoker    Packs/day: 2.50    Years: 40.00    Pack years: 100.00    Types: Cigarettes    Start date: 04/18/1967    Quit date: 12/20/2003    Years since quitting: 15.8  . Smokeless tobacco: Former Network engineer  . Vaping Use: Former  Substance Use Topics  . Alcohol use: Yes    Alcohol/week: 1.0 standard drink    Types: 1 Cans of beer per week  . Drug use: Not Currently    Types: Marijuana    Comment: 30 + years ago - "crank, marjiuanna, ect."     Family Hx: The patient's family history includes Breast cancer in his maternal aunt; Heart disease in his mother; Hypertension in his mother; Prostate cancer in his brother. There is no history of Colon cancer or Colon polyps.  ROS:   Please see the history of present illness.     All other systems reviewed and are negative.   Prior CV studies:   The following studies were reviewed today:  Cardiac Catheterization: 08/2018  Prox RCA to Mid RCA lesion  is 20% stenosed.  Dist Cx lesion is 85% stenosed.  Prox Cx lesion is 10% stenosed.  Ost Cx lesion is 10% stenosed.  Mid LAD lesion is 85% stenosed.  Post intervention, there is a 0% residual stenosis.  The left ventricular systolic function is normal.  LV end diastolic pressure is normal.  A stent was successfully placed.   Preserved global LV function with an EF estimated at 60 to  65%.  LVEDP 15 mm.  Multivessel CAD with new 85% mid LAD stenosis; 10% ostial 10% proximal and 10% AV groove circumflex stenoses with 80% stenosis in a distal branch of an OM 2 vessel; patent RCA stent with intimal hyperplasia of approximately 20%.  Successful PCI of the mid LAD with ultimate insertion of a 2.5 x 22 mm Resolute Onyx DES stent postdilated to 2.6 mm with the 85% stenosis being reduced to 0%.  RECOMMENDATION: DAPT for minimum of 6 months but preferably longer.  Medical therapy for concomitant CAD.  Optimal blood pressure control with ideal blood pressure less than 120/80.  Aggressive lipid-lowering therapy with target LDL less than 70.  Echocardiogram: 06/2019 IMPRESSIONS    1. Left ventricular ejection fraction, by estimation, is 60 to 65%. The  left ventricle has normal function. The left ventricle has no regional  wall motion abnormalities. There is mild left ventricular hypertrophy.  Left ventricular diastolic parameters  are consistent with Grade I diastolic dysfunction (impaired relaxation).  2. Right ventricular systolic function is normal. The right ventricular  size is normal.  3. Left atrial size was mildly dilated.  4. The mitral valve is normal in structure and function. No evidence of  mitral valve regurgitation. No evidence of mitral stenosis.  5. The aortic valve was not well visualized. Aortic valve regurgitation  is not visualized. No aortic stenosis is present.  6. The inferior vena cava is dilated in size with >50% respiratory  variability, suggesting  right atrial pressure of 8 mmHg.   NST: 09/2019  Findings consistent with prior small apical myocardial infarction with mild peri-infarct ischemia.  This is an intermediate risk study. Risk is based on decreased LVEF, consider correlating LVEF with echo.  The left ventricular ejection fraction is mildly decreased (43%).  Labs/Other Tests and Data Reviewed:    EKG:  An ECG dated 09/10/2019 was personally reviewed today and demonstrated:  V-paced, HR 64.  Recent Labs: 06/10/2019: ALT 20; BUN 13; Creatinine, Ser 0.98; Hemoglobin 15.7; Platelets 214; Potassium 4.1; Sodium 144 08/29/2019: B Natriuretic Peptide 58.0   Recent Lipid Panel Lab Results  Component Value Date/Time   CHOL 131 06/10/2019 09:09 AM   TRIG 125 06/10/2019 09:09 AM   HDL 52 06/10/2019 09:09 AM   CHOLHDL 2.5 06/10/2019 09:09 AM   CHOLHDL 2.5 12/17/2012 02:15 AM   LDLCALC 57 06/10/2019 09:09 AM    Wt Readings from Last 3 Encounters:  10/30/19 230 lb (104.3 kg)  09/26/19 232 lb (105.2 kg)  09/10/19 232 lb (105.2 kg)     Objective:    Vital Signs:  BP 140/68   Pulse 68   Ht 6' (1.829 m)   Wt 230 lb (104.3 kg)   BMI 31.19 kg/m    General: Pleasant male appearing in NAD Psych: Normal affect. Neuro: Alert and oriented X 3. Moves all extremities spontaneously. HEENT: Normal by video assessment.   Lungs:  Resp appear regular and unlabored.   ASSESSMENT & PLAN:    1. CAD - He is s/pPCI of the RCA in 2014 and DES to mid-LAD in 08/2018 with residual branch vessel disease as outlined above. He continues to have intermittent chest pain and dyspnea which has occurred for the past year. Recent echo showed a preserved EF of 60 to 65% with no regional wall motion abnormalities. NST showed a prior small apical myocardial infarction with mild peri-infarct ischemia but was felt to be very small and likely not the cause of his dyspnea. -  His chest pain has improved with titration of Imdur and his dyspnea remains  intermittent. Reviewed options with the patient and will continue with medical therapy for now. Will titrate Amlodipine to 5mg  daily to help with BP and for anti-anginal benefit. I encouraged him to make [place] aware if symptoms progress, as a repeat catheterization would need to be considered.  - Continue ASA 81mg  daily, Plavix 75mg  daily, Imdur 120mg  daily, Nadolol 80mg  daily and Crestor 20mg  daily.   2. CHB - He is s/p PPM Placement in 12/2012 with Medtronic CRT-P placement in 03/2014. His device is followed by Dr. Rayann Heman and most recent interrogation in 09/2019 showed normal device function.   3. HTN - He reports SBP has been in the 150's to 160's when checked at home, at 140/68 today. He was previously having hypotension earlier this year with Amlodipine and Hydralazine being reduced. Given his elevated readings, I did recommend he increase his Amlodipine from 2.5mg  daily to 5mg  daily and continue to follow readings with this adjustment. Continue Hydralazine, Imdur, Losartan and Nadolol at current dosing.   4. HLD - FLP in 05/2019 showed total cholesterol of 131, HDL 52, triglycerides 125 and LDL 57. At goal of LDL less than 70. Continue current medication regimen with Crestor 20mg  daily. He wishes to retry the Keto diet and he had successful weight loss with this in the past but triglycerides became elevated. Will recheck FLP in 2 months.   COVID-19 Education: The signs and symptoms of COVID-19 were discussed with the patient and how to seek care for testing (follow up with PCP or arrange E-visit). The importance of social distancing was discussed today.  Time:   Today, I have spent 22 minutes with the patient with telehealth technology discussing the above problems.     Medication Adjustments/Labs and Tests Ordered: Current medicines are reviewed at length with the patient today.  Concerns regarding medicines are outlined above.   Tests Ordered: Orders Placed This Encounter  Procedures    . Lipid Profile    Medication Changes: Meds ordered this encounter  Medications  . amLODipine (NORVASC) 5 MG tablet    Sig: Take 1 tablet (5 mg total) by mouth daily.    Dispense:  90 tablet    Refill:  3    Follow Up:  With Dr. Harl Bowie in 3 months.  Signed, [name], PA-C  10/30/2019 7:13 PM    [facility]

## 2019-10-30 NOTE — Patient Instructions (Signed)
Medication Instructions:   INCREASE AMLODIPINE TO 5 MG DAILY   *If you need a refill on your cardiac medications before your next appointment, please call your pharmacy*   Lab Work:  [place] 2 MONTHS   If you have labs (blood work) drawn today and your tests are completely normal, you will receive your results only by: Marland Kitchen MyChart Message (if you have MyChart) OR . A paper copy in the mail If you have any lab test that is abnormal or we need to change your treatment, we will call you to review the results.   Testing/Procedures: NONE TODAY   Follow-Up: At [facility], you and your health needs are our priority.  As part of our continuing mission to provide you with exceptional heart care, we have created designated Provider Care Teams.  These Care Teams include your primary Cardiologist (physician) and Advanced Practice Providers (APPs -  Physician Assistants and Nurse Practitioners) who all work together to provide you with the care you need, when you need it.  We recommend signing up for the patient portal called "MyChart".  Sign up information is provided on this After Visit Summary.  MyChart is used to connect with patients for Virtual Visits (Telemedicine).  Patients are able to view lab/test results, encounter notes, upcoming appointments, etc.  Non-urgent messages can be sent to your provider as well.   To learn more about what you can do with MyChart, go to [email or web address].    Your next appointment:   3 month(s)  The format for your next appointment:   In Person  Provider:   Carlyle Dolly, MD   Other Instructions NONE     Thank you for choosing [facility] !

## 2019-11-10 ENCOUNTER — Other Ambulatory Visit: Payer: Self-pay

## 2019-11-10 MED ORDER — ISOSORBIDE MONONITRATE ER 120 MG PO TB24: 120.0000 mg | ORAL_TABLET | Freq: Every day | ORAL | 3 refills | Status: DC

## 2019-11-10 NOTE — Telephone Encounter (Signed)
Refilled imdur 120 mg qd

## 2019-11-18 ENCOUNTER — Other Ambulatory Visit: Payer: Self-pay | Admitting: Family Medicine

## 2019-11-20 ENCOUNTER — Other Ambulatory Visit: Payer: Self-pay | Admitting: Family Medicine

## 2019-12-01 ENCOUNTER — Other Ambulatory Visit: Payer: Self-pay | Admitting: Family Medicine

## 2019-12-01 ENCOUNTER — Encounter: Payer: Self-pay | Admitting: Family Medicine

## 2019-12-01 MED ORDER — HYDROMORPHONE HCL 4 MG PO TABS: 4.0000 mg | ORAL_TABLET | Freq: Four times a day (QID) | ORAL | 0 refills | Status: DC | PRN

## 2019-12-01 NOTE — Telephone Encounter (Signed)
Medication refill was sent in as requested Because this medication is controlled further refills beyond this 1 would require office visit (Office visits are required to be documented every 90 days for this type of medicine-so in other words if greater than 90 days for pain related purposes needs another office visit)

## 2019-12-11 ENCOUNTER — Other Ambulatory Visit: Payer: Self-pay | Admitting: Family Medicine

## 2019-12-26 ENCOUNTER — Ambulatory Visit (INDEPENDENT_AMBULATORY_CARE_PROVIDER_SITE_OTHER): Payer: Medicare Other | Admitting: *Deleted

## 2019-12-26 ENCOUNTER — Other Ambulatory Visit: Payer: Self-pay | Admitting: Cardiology

## 2019-12-26 DIAGNOSIS — I441 Atrioventricular block, second degree: Secondary | ICD-10-CM | POA: Diagnosis not present

## 2019-12-26 LAB — CUP PACEART REMOTE DEVICE CHECK
Battery Remaining Longevity: 42 mo
Battery Voltage: 2.97 V
Brady Statistic AP VP Percent: 18.02 %
Brady Statistic AP VS Percent: 0 %
Brady Statistic AS VP Percent: 81.98 %
Brady Statistic AS VS Percent: 0 %
Brady Statistic RA Percent Paced: 18.01 %
Brady Statistic RV Percent Paced: 99.97 %
Date Time Interrogation Session: 20210910105511
Implantable Lead Implant Date: 20151217
Implantable Lead Implant Date: 20151217
Implantable Lead Implant Date: 20151217
Implantable Lead Location: 753858
Implantable Lead Location: 753859
Implantable Lead Location: 753860
Implantable Lead Model: 4196
Implantable Lead Model: 5076
Implantable Lead Model: 5076
Implantable Pulse Generator Implant Date: 20151217
Lead Channel Impedance Value: 1007 Ohm
Lead Channel Impedance Value: 342 Ohm
Lead Channel Impedance Value: 399 Ohm
Lead Channel Impedance Value: 418 Ohm
Lead Channel Impedance Value: 475 Ohm
Lead Channel Impedance Value: 551 Ohm
Lead Channel Impedance Value: 608 Ohm
Lead Channel Impedance Value: 665 Ohm
Lead Channel Impedance Value: 703 Ohm
Lead Channel Pacing Threshold Amplitude: 0.75 V
Lead Channel Pacing Threshold Amplitude: 0.75 V
Lead Channel Pacing Threshold Amplitude: 1 V
Lead Channel Pacing Threshold Pulse Width: 0.4 ms
Lead Channel Pacing Threshold Pulse Width: 0.4 ms
Lead Channel Pacing Threshold Pulse Width: 0.4 ms
Lead Channel Sensing Intrinsic Amplitude: 0.625 mV
Lead Channel Sensing Intrinsic Amplitude: 0.625 mV
Lead Channel Sensing Intrinsic Amplitude: 22.5 mV
Lead Channel Sensing Intrinsic Amplitude: 22.5 mV
Lead Channel Setting Pacing Amplitude: 2 V
Lead Channel Setting Pacing Amplitude: 2.25 V
Lead Channel Setting Pacing Amplitude: 2.5 V
Lead Channel Setting Pacing Pulse Width: 0.4 ms
Lead Channel Setting Pacing Pulse Width: 0.4 ms
Lead Channel Setting Sensing Sensitivity: 0.9 mV

## 2019-12-26 MED ORDER — NADOLOL 80 MG PO TABS: 80.0000 mg | ORAL_TABLET | Freq: Every day | ORAL | 6 refills | Status: DC

## 2019-12-26 NOTE — Telephone Encounter (Signed)
Refilled to [facility] Nadolol 80 mg qd #30

## 2019-12-26 NOTE — Telephone Encounter (Signed)
Please call in nadolol 80mg  to [facility] [address]   thanks

## 2019-12-29 NOTE — Progress Notes (Signed)
Remote pacemaker transmission.   

## 2019-12-30 ENCOUNTER — Other Ambulatory Visit: Payer: Self-pay | Admitting: Student

## 2019-12-30 MED ORDER — HYDRALAZINE HCL 50 MG PO TABS: 50.0000 mg | ORAL_TABLET | Freq: Two times a day (BID) | ORAL | 3 refills | Status: DC

## 2020-01-02 ENCOUNTER — Other Ambulatory Visit: Payer: Self-pay

## 2020-01-02 ENCOUNTER — Encounter: Payer: Self-pay | Admitting: Family Medicine

## 2020-01-02 ENCOUNTER — Ambulatory Visit (INDEPENDENT_AMBULATORY_CARE_PROVIDER_SITE_OTHER): Payer: Medicare Other | Admitting: Family Medicine

## 2020-01-02 VITALS — BP 148/84 | HR 75 | Temp 97.4°F | Wt 222.4 lb

## 2020-01-02 DIAGNOSIS — R053 Chronic cough: Secondary | ICD-10-CM

## 2020-01-02 DIAGNOSIS — N644 Mastodynia: Secondary | ICD-10-CM | POA: Diagnosis not present

## 2020-01-02 DIAGNOSIS — R05 Cough: Secondary | ICD-10-CM | POA: Diagnosis not present

## 2020-01-02 MED ORDER — CEPHALEXIN 500 MG PO CAPS
500.0000 mg | ORAL_CAPSULE | Freq: Four times a day (QID) | ORAL | 0 refills | Status: DC
Start: 2020-01-02 — End: 2020-03-19

## 2020-01-02 MED ORDER — HYDROMORPHONE HCL 4 MG PO TABS: 4.0000 mg | ORAL_TABLET | Freq: Four times a day (QID) | ORAL | 0 refills | Status: DC | PRN

## 2020-01-02 NOTE — Progress Notes (Signed)
   Subjective:    Patient ID: [name], male    DOB: 1955/12/01, 64 y.o.   MRN: [id number]  HPI Pt here today for sore left nipple. Pt states nipple area feels hard. Pt states it has been sore about a week and a half. Has tried Tylenol.  Nipple region on the left side somewhat sore and tender to the touch denies any trouble with it.  Overall feeling good.  Patient does get intermittent chest pain unrelated to this he is being followed by cardiology Patient also denies any fevers chills or sweats Pt has coughing spasms. Chest has been sore due to coughing.  He gets these coughing spasms that occur out of the blue he does not feel he is having postnasal drip does not feel he is regurgitating does not feel he is wheezing  Review of Systems See above    Objective:   Physical Exam Lungs clear respiratory rate normal heart regular no murmurs left nipple is slightly tender in to some degree firm no specific mass felt       Assessment & Plan:  Nipple pain and discomfort on the left side. Antibiotics 4 times daily for the next 10 days Follow-up in 2 weeks If still having troubles referral at that time for mammogram  Chronic cough recommend further evaluation referral to pulmonary.  Patient denies postnasal drip denies reflux denies chest tightness

## 2020-01-08 ENCOUNTER — Encounter: Payer: Self-pay | Admitting: Family Medicine

## 2020-01-13 ENCOUNTER — Telehealth: Payer: Self-pay | Admitting: Family Medicine

## 2020-01-13 DIAGNOSIS — N644 Mastodynia: Secondary | ICD-10-CM

## 2020-01-13 NOTE — Telephone Encounter (Signed)
Patient will need diagnostic mammogram and ultrasound of the left nipple due to left breast nodule Please schedule Then schedule patient office visit to coordinate after this visit for the mammogram ultrasound

## 2020-01-13 NOTE — Telephone Encounter (Signed)
Set up Diagnostic mammo and u/s. Please advise. Thank you

## 2020-01-14 NOTE — Telephone Encounter (Signed)
Ultrasound and Diagnostic Mammogram set up for 02/10/20 at 10 am. Per [facility], they are going to be adding more mammo days but at this time are not sure of add on day for new breast doctor. [facility] will contact patient directly to move appt up. No lotion, powders or deodorants around the breast area. Left message to return call. Will also send my chart message.

## 2020-01-14 NOTE — Telephone Encounter (Signed)
Error: pt vm is full unable to leave message. Did send my chart message

## 2020-01-14 NOTE — Addendum Note (Signed)
Addended by: Vicente Males on: 01/14/2020 11:56 AM   Modules accepted: Orders

## 2020-01-16 NOTE — Telephone Encounter (Signed)
Patient notified and verbalized understanding and stated he was aware of the appointment.

## 2020-01-19 ENCOUNTER — Ambulatory Visit: Payer: Medicare Other | Admitting: Family Medicine

## 2020-01-30 ENCOUNTER — Other Ambulatory Visit: Payer: Self-pay | Admitting: Student

## 2020-01-30 DIAGNOSIS — E785 Hyperlipidemia, unspecified: Secondary | ICD-10-CM | POA: Diagnosis not present

## 2020-01-31 LAB — LIPID PANEL W/O CHOL/HDL RATIO
Cholesterol, Total: 145 mg/dL (ref 100–199)
HDL: 56 mg/dL (ref >39)
LDL Chol Calc (NIH): 74 mg/dL (ref 0–99)
Triglycerides: 76 mg/dL (ref 0–149)
VLDL Cholesterol Cal: 15 mg/dL (ref 5–40)

## 2020-02-03 ENCOUNTER — Ambulatory Visit: Payer: Medicare Other | Admitting: Cardiology

## 2020-02-03 ENCOUNTER — Other Ambulatory Visit: Payer: Self-pay

## 2020-02-03 VITALS — BP 162/84 | HR 65 | Wt 221.4 lb

## 2020-02-03 DIAGNOSIS — R0789 Other chest pain: Secondary | ICD-10-CM

## 2020-02-03 DIAGNOSIS — I2510 Atherosclerotic heart disease of native coronary artery without angina pectoris: Secondary | ICD-10-CM

## 2020-02-03 DIAGNOSIS — I10 Essential (primary) hypertension: Secondary | ICD-10-CM

## 2020-02-03 DIAGNOSIS — E782 Mixed hyperlipidemia: Secondary | ICD-10-CM | POA: Diagnosis not present

## 2020-02-03 MED ORDER — AMLODIPINE BESYLATE 10 MG PO TABS
10.0000 mg | ORAL_TABLET | Freq: Every day | ORAL | 1 refills | Status: DC
Start: 2020-02-03 — End: 2020-08-12

## 2020-02-03 NOTE — Patient Instructions (Signed)
Medication Instructions:  Your physician has recommended you make the following change in your medication:  1.) increase amlodipine (Norvasc) to 10 mg daily  *If you need a refill on your cardiac medications before your next appointment, please call your pharmacy*   Lab Work: none If you have labs (blood work) drawn today and your tests are completely normal, you will receive your results only by: Marland Kitchen MyChart Message (if you have MyChart) OR . A paper copy in the mail If you have any lab test that is abnormal or we need to change your treatment, we will call you to review the results.   Testing/Procedures: none   Follow-Up: At [facility], you and your health needs are our priority.  As part of our continuing mission to provide you with exceptional heart care, we have created designated Provider Care Teams.  These Care Teams include your primary Cardiologist (physician) and Advanced Practice Providers (APPs -  Physician Assistants and Nurse Practitioners) who all work together to provide you with the care you need, when you need it.   Your next appointment:   4 month(s)  The format for your next appointment:   In Person  Provider:   You may see Carlyle Dolly, MD or one of the following Advanced Practice Providers on your designated Care Team:    Bernerd Pho, PA-C   Ermalinda Barrios, [place]     Other Instructions

## 2020-02-03 NOTE — Progress Notes (Signed)
Clinical Summary Mr. Staton is a 64 y.o.male  1. CAD - history of DES to LAD in 08/2018, PCI to RCA in 2014  06/2019 echo: LVEF 60-65%, no WMAs, grade I DDx,  - 09/2019 nuclear stress: small apical infarct with mild peri-infarct ischemia  - recent chest pains. Midchest pain that goes to back, left shoulder. Tightness like feeling. Often brought on with stress. Mild to moderate discomfort. Can have some SOB associated. Can last 24 hours constant. Better with xanax. Not positional. No relation to food or eating.     2. Complete heart blocker - pacemaker followed by Dr Rayann Heman, CRT-P - 12/2019 remote check was normal.   3. HTN - home bp's 160s - compliant with meds  - takes hydralazine 50mg  bid.  4. Hyperlipidemia 01/2020 TC 145 TG 76 HDL 56 LDL 74   SH; just sold his house, was able to get a great price and only on market for a week. Living in a fifth wheel for the time being.   Past Medical History:  Diagnosis Date  . ADD (attention deficit disorder)    Rx-Adderall  . Anginal pain ([place])   . AV block, 3rd degree (HCC)   . Colon polyps 2011   tubular adenoma by excisional biopsy during colonoscopy  . Complete heart block [facility])    a. s/p PPM Placement in 12/2012 with [employer] CRT-P placement in 03/2014  . Coronary artery disease    a. s/p PCI of the RCA in 2014 b. 08/2018: patent RCA stent with new mid-LAD 85% stenosis (treated with PCI/DES) and 85% distal OM2 stenosis (med management recommended)  . Degenerative disc disease, cervical   . Gastrointestinal bleeding 10/15/2017  . GERD (gastroesophageal reflux disease)   . Hypertension   . Pacemaker   . Palpitations 2003   Holter in 2003: PACs and PVCs; negative stress nuclear test in 2005; right bundle [place] block; echo in 2008-mild LVH, otherwise normal; 2008-negative stress nuclear test.  . Pneumonia   . Prostatitis    prostate calcifications by CT  . Sleep apnea    questionable diagnosis     Allergies    Allergen Reactions  . Morphine And Related Itching and Rash    redness  . Lasix [Furosemide]     Chest pain and tightness  . Latex Rash     Current Outpatient Medications  Medication Sig Dispense Refill  . albuterol (VENTOLIN HFA) 108 (90 Base) MCG/ACT inhaler Inhale 2 puffs into the lungs every 4 (four) hours as needed for wheezing. 18 g 2  . ALPRAZolam (XANAX) 0.5 MG tablet TAKE 1 TABLET(0.5 MG) BY MOUTH TWICE DAILY AS NEEDED FOR ANXIETY OR SLEEP 20 tablet 1  . amLODipine (NORVASC) 5 MG tablet Take 1 tablet (5 mg total) by mouth daily. 90 tablet 3  . aspirin 81 MG chewable tablet Chew 81 mg by mouth daily.    . cephALEXin (KEFLEX) 500 MG capsule Take 1 capsule (500 mg total) by mouth 4 (four) times daily. 40 capsule 0  . clopidogrel (PLAVIX) 75 MG tablet THE DAY AFTER STOPPING BRILINTA, TAKE 4 TABLETS BY MOUTH ONCE, THEN BEGIN TAKING ONE TABLET ONCE DAILY THEREAFTER 90 tablet 3  . cyclobenzaprine (FLEXERIL) 5 MG tablet Take 5 mg by mouth at bedtime as needed for muscle spasms.    Marland Kitchen esomeprazole (NEXIUM) 40 MG capsule TAKE 1 CAPSULE BY MOUTH EVERY DAY 30 capsule 2  . hydrALAZINE (APRESOLINE) 25 MG tablet Take 25 mg by mouth as directed.  25 mg mid day    . hydrALAZINE (APRESOLINE) 50 MG tablet Take 1 tablet (50 mg total) by mouth in the morning and at bedtime. 180 tablet 3  . HYDROmorphone (DILAUDID) 4 MG tablet Take 1 tablet (4 mg total) by mouth every 6 (six) hours as needed for severe pain. 20 tablet 0  . isosorbide mononitrate (IMDUR) 120 MG 24 hr tablet Take 1 tablet (120 mg total) by mouth daily. 90 tablet 3  . losartan (COZAAR) 100 MG tablet TAKE 1 TABLET(100 MG) BY MOUTH DAILY 90 tablet 0  . nadolol (CORGARD) 80 MG tablet Take 1 tablet (80 mg total) by mouth daily. 30 tablet 6  . nitroGLYCERIN (NITROSTAT) 0.4 MG SL tablet Place 1 tablet (0.4 mg total) under the tongue every 5 (five) minutes as needed for chest pain (MAX 3 TABLETS). 25 tablet 3  . promethazine (PHENERGAN) 25 MG  tablet TAKE 1 TABLET BY MOUTH TWICE DAILY AS NEEDED FOR NAUSEA 30 tablet 5  . rosuvastatin (CRESTOR) 20 MG tablet TAKE 1 TABLET(20 MG) BY MOUTH DAILY 30 tablet 4  . tamsulosin (FLOMAX) 0.4 MG CAPS capsule Take one capsule po daily 90 capsule 1   No current facility-administered medications for this visit.     Past Surgical History:  Procedure Laterality Date  . ANTERIOR CERVICAL DECOMP/DISCECTOMY FUSION    . BACK SURGERY    . Biventricular pacemaker upgrade/ system revision  04/02/14   removal of previous atrial and ventricular leads with placement of a new MDT Consulta CRT-P system at [facility] by Dr Violet Baldy  . COLONOSCOPY    . COLONOSCOPY W/ POLYPECTOMY  2011   tubular adenoma  . CORONARY STENT INTERVENTION N/A 08/22/2018   Procedure: CORONARY STENT INTERVENTION;  Surgeon: Troy Sine, MD;  Location: [place] [facility] LAB;  Service: Cardiovascular;  Laterality: N/A;  . ESOPHAGOGASTRODUODENOSCOPY     Gastritis  . INSERT / REPLACE / REMOVE PACEMAKER  12/17/2012   MDT Adapta L implanted by Dr Rayann Heman for mobitz II second degree AV block  . KNEE SURGERY Right   . LEFT HEART CATH AND CORONARY ANGIOGRAPHY N/A 08/22/2018   Procedure: LEFT HEART CATH AND CORONARY ANGIOGRAPHY;  Surgeon: Troy Sine, MD;  Location: [place] [facility];  Service: Cardiovascular;  Laterality: N/A;  . LEFT HEART CATHETERIZATION WITH CORONARY ANGIOGRAM N/A 10/17/2012   Procedure: LEFT HEART CATHETERIZATION WITH CORONARY ANGIOGRAM;  Surgeon: Josue Hector, MD;  Location: [facility] CATH LAB;  Service: Cardiovascular;  Laterality: N/A;  . LEFT HEART CATHETERIZATION WITH CORONARY ANGIOGRAM N/A 12/17/2012   Procedure: LEFT HEART CATHETERIZATION WITH CORONARY ANGIOGRAM;  Surgeon: Peter M Martinique, MD;  Location: [facility] CATH LAB;  Service: Cardiovascular;  Laterality: N/A;  . LEFT HEART CATHETERIZATION WITH CORONARY ANGIOGRAM N/A 06/11/2013   Procedure: LEFT HEART CATHETERIZATION WITH CORONARY ANGIOGRAM;  Surgeon: Wellington Hampshire, MD;  Location: [facility] CATH LAB;  Service: Cardiovascular;  Laterality: N/A;  . PERCUTANEOUS CORONARY STENT INTERVENTION (PCI-S)  10/17/2012   Procedure: PERCUTANEOUS CORONARY STENT INTERVENTION (PCI-S);  Surgeon: Josue Hector, MD;  Location: [facility] CATH LAB;  Service: Cardiovascular;;  . PERMANENT PACEMAKER INSERTION N/A 12/17/2012   Procedure: PERMANENT PACEMAKER INSERTION;  Surgeon: Thompson Grayer, MD;  Location: [facility] CATH LAB;  Service: Cardiovascular;  Laterality: N/A;  . POLYPECTOMY    . UPPER GASTROINTESTINAL ENDOSCOPY       Allergies  Allergen Reactions  . Morphine And Related Itching and Rash    redness  . Lasix [Furosemide]  Chest pain and tightness  . Latex Rash      Family History  Problem Relation Age of Onset  . Heart disease Mother   . Hypertension Mother        Carotid disease  . Prostate cancer Brother   . Breast cancer Maternal Aunt   . Colon cancer Neg Hx   . Colon polyps Neg Hx      Social History Mr. Overfield reports that he quit smoking about 16 years ago. His smoking use included cigarettes. He started smoking about 52 years ago. He has a 100.00 pack-year smoking history. He has quit using smokeless tobacco. Mr. Rathert reports current alcohol use of about 1.0 standard drink of alcohol per week.   Review of Systems CONSTITUTIONAL: No weight loss, fever, chills, weakness or fatigue.  HEENT: Eyes: No visual loss, blurred vision, double vision or yellow sclerae.No hearing loss, sneezing, congestion, runny nose or sore throat.  SKIN: No rash or itching.  CARDIOVASCULAR: per hpi RESPIRATORY: No shortness of breath, cough or sputum.  GASTROINTESTINAL: No anorexia, nausea, vomiting or diarrhea. No abdominal pain or blood.  GENITOURINARY: No burning on urination, no polyuria NEUROLOGICAL: No headache, dizziness, syncope, paralysis, ataxia, numbness or tingling in the extremities. No change in bowel or bladder control.  MUSCULOSKELETAL: No muscle, back pain,  joint pain or stiffness.  LYMPHATICS: No enlarged nodes. No history of splenectomy.  PSYCHIATRIC: No history of depression or anxiety.  ENDOCRINOLOGIC: No reports of sweating, cold or heat intolerance. No polyuria or polydipsia.  Marland Kitchen   Physical Examination Today's Vitals   02/03/20 1327  BP: (!) 162/84  Pulse: 65  SpO2: 96%  Weight: 221 lb 6.4 oz (100.4 kg)   Body mass index is 30.03 kg/m.  Gen: resting comfortably, no acute distress HEENT: no scleral icterus, pupils equal round and reactive, no palptable cervical adenopathy,  CV: RRR, no m/r/g no jvd Resp: Clear to auscultation bilaterally GI: abdomen is soft, non-tender, non-distended, normal bowel sounds, no hepatosplenomegaly MSK: extremities are warm, no edema.  Skin: warm, no rash Neuro:  no focal deficits Psych: appropriate affect     Assessment and Plan  1. CAD - recent atypical chest pain symptoms, lasting up to 24 hours and better with xanax. Recent nuclear stress was low risk - cotninue current meds  2. HTN - above goal, increase norvac to 10mg  daily  3. Hyperlipidemia - LDL essentially at goal, continue statin   F/u 4 months      Arnoldo Lenis, M.D.

## 2020-02-04 DIAGNOSIS — L718 Other rosacea: Secondary | ICD-10-CM | POA: Diagnosis not present

## 2020-02-04 DIAGNOSIS — L820 Inflamed seborrheic keratosis: Secondary | ICD-10-CM | POA: Diagnosis not present

## 2020-02-04 DIAGNOSIS — D225 Melanocytic nevi of trunk: Secondary | ICD-10-CM | POA: Diagnosis not present

## 2020-02-04 DIAGNOSIS — L821 Other seborrheic keratosis: Secondary | ICD-10-CM | POA: Diagnosis not present

## 2020-02-10 ENCOUNTER — Ambulatory Visit (HOSPITAL_COMMUNITY): Admission: RE | Admit: 2020-02-10 | Payer: Medicare Other | Source: Ambulatory Visit

## 2020-02-10 ENCOUNTER — Ambulatory Visit (HOSPITAL_COMMUNITY)
Admission: RE | Admit: 2020-02-10 | Discharge: 2020-02-10 | Disposition: A | Discharge status: Home / Self Care | Payer: Medicare Other | Source: Ambulatory Visit | Attending: Family Medicine | Admitting: Family Medicine

## 2020-02-10 ENCOUNTER — Other Ambulatory Visit: Payer: Self-pay

## 2020-02-10 DIAGNOSIS — N644 Mastodynia: Secondary | ICD-10-CM | POA: Insufficient documentation

## 2020-02-10 DIAGNOSIS — N62 Hypertrophy of breast: Secondary | ICD-10-CM | POA: Diagnosis not present

## 2020-02-12 DIAGNOSIS — Z0120 Encounter for dental examination and cleaning without abnormal findings: Secondary | ICD-10-CM | POA: Diagnosis not present

## 2020-03-02 ENCOUNTER — Encounter: Payer: Self-pay | Admitting: Family Medicine

## 2020-03-03 ENCOUNTER — Other Ambulatory Visit: Payer: Self-pay | Admitting: Family Medicine

## 2020-03-03 MED ORDER — HYDROMORPHONE HCL 4 MG PO TABS: 4.0000 mg | ORAL_TABLET | Freq: Four times a day (QID) | ORAL | 0 refills | Status: DC | PRN

## 2020-03-10 ENCOUNTER — Other Ambulatory Visit: Payer: Self-pay | Admitting: Family Medicine

## 2020-03-17 ENCOUNTER — Ambulatory Visit
Admission: EM | Admit: 2020-03-17 | Discharge: 2020-03-17 | Discharge status: Absent without leave | Payer: Medicare Other

## 2020-03-17 ENCOUNTER — Other Ambulatory Visit: Payer: Self-pay

## 2020-03-19 ENCOUNTER — Other Ambulatory Visit: Payer: Self-pay

## 2020-03-19 ENCOUNTER — Encounter: Payer: Self-pay | Admitting: Family Medicine

## 2020-03-19 ENCOUNTER — Ambulatory Visit (INDEPENDENT_AMBULATORY_CARE_PROVIDER_SITE_OTHER): Payer: Medicare Other | Admitting: Family Medicine

## 2020-03-19 VITALS — HR 80 | Temp 98.6°F | Resp 16

## 2020-03-19 DIAGNOSIS — J019 Acute sinusitis, unspecified: Secondary | ICD-10-CM | POA: Diagnosis not present

## 2020-03-19 MED ORDER — AMOXICILLIN-POT CLAVULANATE 875-125 MG PO TABS: 1.0000 | ORAL_TABLET | Freq: Two times a day (BID) | ORAL | 0 refills | Status: DC

## 2020-03-19 NOTE — Progress Notes (Signed)
Pt having cough, stuffy head, sore throat, and coughing up green. Going on about 5 days. Did have a COVID test a couple days ago. Has been taking dayquil and nyquil.     Patient ID: [name], male    DOB: 20-Apr-1955, 64 y.o.   MRN: [id number]   Chief Complaint  Patient presents with  . Cough   Subjective:  CC: sore throat, sinus pain and pressure, cough  This is a new problem. Presents with c/o sore throat, sinus pain and pressure, cough, congestion, feels lousy. Started 5 days ago. Denies fever, chills, chest pain, shortness of breath. Positive headaches and body aches. Tired OTC symptom relief for cough. History of sinus infection. Took Covid test 2 days ago and has not gotten results yet. Will see outside today.     Medical History [name] has a past medical history of ADD (attention deficit disorder), Anginal pain ([place]), AV block, 3rd degree ([place]), Colon polyps (2011), Complete heart block ([place]), Coronary artery disease, Degenerative disc disease, cervical, Gastrointestinal bleeding (10/15/2017), GERD (gastroesophageal reflux disease), Hypertension, Pacemaker, Palpitations (2003), Pneumonia, Prostatitis, and Sleep apnea.   Outpatient Encounter Medications as of 03/19/2020  Medication Sig  . albuterol (VENTOLIN HFA) 108 (90 Base) MCG/ACT inhaler Inhale 2 puffs into the lungs every 4 (four) hours as needed for wheezing.  Marland Kitchen ALPRAZolam (XANAX) 0.5 MG tablet TAKE 1 TABLET(0.5 MG) BY MOUTH TWICE DAILY AS NEEDED FOR ANXIETY OR SLEEP  . amLODipine (NORVASC) 10 MG tablet Take 1 tablet (10 mg total) by mouth daily.  Marland Kitchen aspirin 81 MG chewable tablet Chew 81 mg by mouth daily.  . clopidogrel (PLAVIX) 75 MG tablet THE DAY AFTER STOPPING BRILINTA, TAKE 4 TABLETS BY MOUTH ONCE, THEN BEGIN TAKING ONE TABLET ONCE DAILY THEREAFTER  . cyclobenzaprine (FLEXERIL) 5 MG tablet Take 5 mg by mouth at bedtime as needed for muscle spasms.   Marland Kitchen esomeprazole (NEXIUM) 40 MG capsule TAKE 1 CAPSULE BY MOUTH  EVERY DAY  . hydrALAZINE (APRESOLINE) 25 MG tablet Take 25 mg by mouth as directed. 25 mg mid day   . hydrALAZINE (APRESOLINE) 50 MG tablet Take 1 tablet (50 mg total) by mouth in the morning and at bedtime.  Marland Kitchen HYDROmorphone (DILAUDID) 4 MG tablet Take 1 tablet (4 mg total) by mouth every 6 (six) hours as needed for severe pain.  Marland Kitchen losartan (COZAAR) 100 MG tablet TAKE 1 TABLET(100 MG) BY MOUTH DAILY  . nadolol (CORGARD) 80 MG tablet Take 1 tablet (80 mg total) by mouth daily.  . nitroGLYCERIN (NITROSTAT) 0.4 MG SL tablet Place 1 tablet (0.4 mg total) under the tongue every 5 (five) minutes as needed for chest pain (MAX 3 TABLETS).  . promethazine (PHENERGAN) 25 MG tablet TAKE 1 TABLET BY MOUTH TWICE DAILY AS NEEDED FOR NAUSEA  . rosuvastatin (CRESTOR) 20 MG tablet TAKE 1 TABLET(20 MG) BY MOUTH DAILY  . tamsulosin (FLOMAX) 0.4 MG CAPS capsule TAKE 1 CAPSULE BY MOUTH DAILY  . [DISCONTINUED] cephALEXin (KEFLEX) 500 MG capsule Take 1 capsule (500 mg total) by mouth 4 (four) times daily.  Marland Kitchen amoxicillin-clavulanate (AUGMENTIN) 875-125 MG tablet Take 1 tablet by mouth 2 (two) times daily.  . isosorbide mononitrate (IMDUR) 120 MG 24 hr tablet Take 1 tablet (120 mg total) by mouth daily.   No facility-administered encounter medications on file as of 03/19/2020.     Review of Systems  Constitutional: Negative for chills and fever.  HENT: Positive for congestion, sinus pressure and sinus pain.   Respiratory:  Positive for cough. Negative for shortness of breath.   Cardiovascular: Negative for chest pain.  Gastrointestinal: Negative for abdominal pain.  Neurological: Positive for headaches.     Vitals Pulse 80   Temp 98.6 F (37 C)   Resp 16   SpO2 95%   Objective:   Physical Exam Constitutional:      General: He is not in acute distress.    Appearance: Normal appearance.  HENT:     Right Ear: Tympanic membrane normal.     Left Ear: Tympanic membrane normal.     Nose: Congestion and  rhinorrhea present.     Right Turbinates: Swollen.     Left Turbinates: Swollen.     Right Sinus: Maxillary sinus tenderness and frontal sinus tenderness present.     Left Sinus: Maxillary sinus tenderness and frontal sinus tenderness present.     Mouth/Throat:     Mouth: Mucous membranes are moist.     Pharynx: No oropharyngeal exudate or posterior oropharyngeal erythema.  Cardiovascular:     Rate and Rhythm: Normal rate and regular rhythm.     Heart sounds: Normal heart sounds.  Pulmonary:     Effort: Pulmonary effort is normal.     Breath sounds: Normal breath sounds.  Skin:    General: Skin is warm and dry.  Neurological:     Mental Status: He is alert and oriented to person, place, and time.  Psychiatric:        Mood and Affect: Mood normal.        Behavior: Behavior normal.      Assessment and Plan   1. Acute rhinosinusitis - amoxicillin-clavulanate (AUGMENTIN) 875-125 MG tablet; Take 1 tablet by mouth 2 (two) times daily.  Dispense: 20 tablet; Refill: 0   Will treat for sinus infection for 10 days. Lungs clear.   Agrees with plan of care discussed today. Understands warning signs to seek further care: chest pain, shortness of breath, any significant changes. Understands to follow-up if symptoms do not improve, or worsen. He is pending a Covid test from 2 days ago, will not retest.     Chalmers Guest, NP 03/19/2020

## 2020-03-30 ENCOUNTER — Other Ambulatory Visit: Payer: Self-pay | Admitting: Family Medicine

## 2020-03-31 ENCOUNTER — Other Ambulatory Visit: Payer: Self-pay | Admitting: Family Medicine

## 2020-04-15 NOTE — Telephone Encounter (Signed)
Sent mychart message

## 2020-04-22 ENCOUNTER — Other Ambulatory Visit: Payer: Self-pay | Admitting: *Deleted

## 2020-04-22 ENCOUNTER — Telehealth: Payer: Self-pay

## 2020-04-22 ENCOUNTER — Ambulatory Visit (INDEPENDENT_AMBULATORY_CARE_PROVIDER_SITE_OTHER): Payer: Medicare Other

## 2020-04-22 DIAGNOSIS — I10 Essential (primary) hypertension: Secondary | ICD-10-CM

## 2020-04-22 DIAGNOSIS — I441 Atrioventricular block, second degree: Secondary | ICD-10-CM | POA: Diagnosis not present

## 2020-04-22 DIAGNOSIS — Z125 Encounter for screening for malignant neoplasm of prostate: Secondary | ICD-10-CM

## 2020-04-22 DIAGNOSIS — Z79899 Other long term (current) drug therapy: Secondary | ICD-10-CM

## 2020-04-22 DIAGNOSIS — E7849 Other hyperlipidemia: Secondary | ICD-10-CM

## 2020-04-22 LAB — CUP PACEART REMOTE DEVICE CHECK
Battery Remaining Longevity: 39 mo
Battery Voltage: 2.96 V
Brady Statistic AP VP Percent: 22.54 %
Brady Statistic AP VS Percent: 0 %
Brady Statistic AS VP Percent: 77.45 %
Brady Statistic AS VS Percent: 0 %
Brady Statistic RA Percent Paced: 22.53 %
Brady Statistic RV Percent Paced: 99.98 %
Date Time Interrogation Session: 20220105170926
Implantable Lead Implant Date: 20151217
Implantable Lead Implant Date: 20151217
Implantable Lead Implant Date: 20151217
Implantable Lead Location: 753858
Implantable Lead Location: 753859
Implantable Lead Location: 753860
Implantable Lead Model: 4196
Implantable Lead Model: 5076
Implantable Lead Model: 5076
Implantable Pulse Generator Implant Date: 20151217
Lead Channel Impedance Value: 361 Ohm
Lead Channel Impedance Value: 399 Ohm
Lead Channel Impedance Value: 418 Ohm
Lead Channel Impedance Value: 456 Ohm
Lead Channel Impedance Value: 551 Ohm
Lead Channel Impedance Value: 570 Ohm
Lead Channel Impedance Value: 646 Ohm
Lead Channel Impedance Value: 665 Ohm
Lead Channel Impedance Value: 969 Ohm
Lead Channel Pacing Threshold Amplitude: 0.75 V
Lead Channel Pacing Threshold Amplitude: 0.75 V
Lead Channel Pacing Threshold Amplitude: 1 V
Lead Channel Pacing Threshold Pulse Width: 0.4 ms
Lead Channel Pacing Threshold Pulse Width: 0.4 ms
Lead Channel Pacing Threshold Pulse Width: 0.4 ms
Lead Channel Sensing Intrinsic Amplitude: 2.25 mV
Lead Channel Sensing Intrinsic Amplitude: 2.25 mV
Lead Channel Sensing Intrinsic Amplitude: 22.5 mV
Lead Channel Sensing Intrinsic Amplitude: 22.5 mV
Lead Channel Setting Pacing Amplitude: 2 V
Lead Channel Setting Pacing Amplitude: 2.25 V
Lead Channel Setting Pacing Amplitude: 2.5 V
Lead Channel Setting Pacing Pulse Width: 0.4 ms
Lead Channel Setting Pacing Pulse Width: 0.4 ms
Lead Channel Setting Sensing Sensitivity: 0.9 mV

## 2020-04-22 NOTE — Telephone Encounter (Signed)
Tc- vm full. Labs ordered.

## 2020-04-22 NOTE — Telephone Encounter (Signed)
Pt made a med check appt for Tuesday 11th is there any blood work that needs to be done.  Pt call back [phone]

## 2020-04-27 ENCOUNTER — Ambulatory Visit (INDEPENDENT_AMBULATORY_CARE_PROVIDER_SITE_OTHER): Payer: Medicare Other | Admitting: Family Medicine

## 2020-04-27 ENCOUNTER — Other Ambulatory Visit: Payer: Self-pay

## 2020-04-27 ENCOUNTER — Encounter: Payer: Self-pay | Admitting: Family Medicine

## 2020-04-27 VITALS — BP 128/68 | HR 79 | Temp 98.1°F | Ht 72.0 in | Wt 225.0 lb

## 2020-04-27 DIAGNOSIS — M5441 Lumbago with sciatica, right side: Secondary | ICD-10-CM

## 2020-04-27 DIAGNOSIS — I10 Essential (primary) hypertension: Secondary | ICD-10-CM

## 2020-04-27 DIAGNOSIS — M5442 Lumbago with sciatica, left side: Secondary | ICD-10-CM

## 2020-04-27 DIAGNOSIS — R0600 Dyspnea, unspecified: Secondary | ICD-10-CM | POA: Diagnosis not present

## 2020-04-27 DIAGNOSIS — R0609 Other forms of dyspnea: Secondary | ICD-10-CM

## 2020-04-27 DIAGNOSIS — I25118 Atherosclerotic heart disease of native coronary artery with other forms of angina pectoris: Secondary | ICD-10-CM | POA: Diagnosis not present

## 2020-04-27 MED ORDER — NITROGLYCERIN 0.4 MG SL SUBL: 0.4000 mg | SUBLINGUAL_TABLET | SUBLINGUAL | 3 refills | Status: DC | PRN

## 2020-04-27 MED ORDER — HYDROMORPHONE HCL 4 MG PO TABS: 4.0000 mg | ORAL_TABLET | Freq: Four times a day (QID) | ORAL | 0 refills | Status: DC | PRN

## 2020-04-27 MED ORDER — ROSUVASTATIN CALCIUM 20 MG PO TABS: ORAL_TABLET | ORAL | 1 refills | Status: DC

## 2020-04-27 MED ORDER — TAMSULOSIN HCL 0.4 MG PO CAPS: ORAL_CAPSULE | ORAL | 1 refills | Status: DC

## 2020-04-27 MED ORDER — LOSARTAN POTASSIUM 100 MG PO TABS: ORAL_TABLET | ORAL | 1 refills | Status: DC

## 2020-04-27 NOTE — Telephone Encounter (Signed)
Patient seen Dr Nicki Reaper 04/27/2020

## 2020-04-27 NOTE — Progress Notes (Signed)
Subjective:    Patient ID: [name], male    DOB: 03-Sep-1955, 65 y.o.   MRN: [id number]  HPIMed check up.  Patient feels that his heart is doing okay He is concerned that he has intermittent cough he also relates that he gets out of breath sometimes with simple things such as bending over other times with moving about the 5th wheel he lives in but at the same time he is able to go for walks at a mild pace for a couple miles without difficulty Pt would like a refill on nitro to have on hand.  His cardiologist feels things are going okay but nonetheless he would like that the nitroglycerin on hand  Back pain going on for awhile. Takes dilaudid. States he takes about one every 5 days.  His back pain is severe it is in the middle of the back and radiates down both legs makes it very difficult for him to do much without severe pain uses Dilaudid occasionally once every several days  Patient is working on healthy eating trying to keep his weight check  He states he will see Dr. Ellene Route his back specialist in the near future but first he like to go see a chiropractor care to see if that would help he states he will set this up in [place] he will let us know if he needs any help  Would like to get a tetanus booster.   breathing see above Review of Systems  Constitutional: Negative for diaphoresis and fatigue.  HENT: Negative for congestion and rhinorrhea.   Respiratory: Positive for shortness of breath. Negative for cough.   Cardiovascular: Negative for chest pain and leg swelling.  Gastrointestinal: Negative for abdominal pain and diarrhea.  Musculoskeletal: Positive for arthralgias and back pain.  Skin: Negative for color change and rash.  Neurological: Negative for dizziness and headaches.  Psychiatric/Behavioral: Negative for behavioral problems and confusion.       Objective:   Physical Exam Vitals reviewed.  Constitutional:      General: He is not in acute distress.     Appearance: He is well-nourished.  HENT:     Head: Normocephalic and atraumatic.  Eyes:     General:        Right eye: No discharge.        Left eye: No discharge.  Neck:     Trachea: No tracheal deviation.  Cardiovascular:     Rate and Rhythm: Normal rate and regular rhythm.     Heart sounds: Normal heart sounds. No murmur heard.   Pulmonary:     Effort: Pulmonary effort is normal. No respiratory distress.     Breath sounds: Normal breath sounds.  Musculoskeletal:        General: No edema.  Lymphadenopathy:     Cervical: No cervical adenopathy.  Skin:    General: Skin is warm and dry.  Neurological:     Mental Status: He is alert.     Coordination: Coordination normal.  Psychiatric:        Mood and Affect: Mood and affect normal.        Behavior: Behavior normal.           Assessment & Plan:  1. Primary hypertension Blood pressure good control continue current measures  2. Coronary artery disease of native artery of native heart with stable angina pectoris (HCC) Nitroglycerin refills given not having any severe problems  3. DOE (dyspnea on exertion) Shortness of breath that intermittently  occurs with various activities that could be related to underlying lung disease already has had thorough cardiac work-up  Recommend pulmonary consult  4. Acute bilateral low back pain with bilateral sciatica Patient will be seeing chiropractor and potentially Dr. Ellene Route possibly having a myelogram needed refill of his pain medicine he uses it sparingly

## 2020-04-27 NOTE — Progress Notes (Signed)
Pt contacted and verbalized understanding. Pt has [employer]

## 2020-05-06 NOTE — Progress Notes (Signed)
Remote pacemaker transmission.   

## 2020-05-12 ENCOUNTER — Telehealth: Payer: Self-pay | Admitting: *Deleted

## 2020-05-12 ENCOUNTER — Encounter: Payer: Self-pay | Admitting: Family Medicine

## 2020-05-12 NOTE — Telephone Encounter (Signed)
FYI  [employer]" to Kathyrn Drown, MD      05/12/20 4:47 PM Hey Dr Nicki Reaper, Think I have COVID.  Tested today, but won't have results for up to 72 hours. I'm slightly congested with a mild cough. My temperature is between 99.6 and 100.5. My main problem is the pain in my arms, shoulders, legs, neck and back. I'm taking Tylenol 1000 to 2000mg  a day, without much relief.  Can you suggest another over the counter pain medication that I can take with the Tylenol?  Should I be doing anything else to get ahead of the covid?  Thanks   Me to Max[place]il[place], Riel"   WE   05/12/20 4:55 PM Good afternoon Joneen Caraway,   I will forward your message to Dr. Nicki Reaper.  In the mean time you can alternate tylenol and ibuprofen for discomfort/pain and treat any congestion, cough with otc medications. Drink plenty of fluids and get plenty of rest.   If you start to develop any shortness of breath, chest tightness, mental changes or high fevers we recommend you go to the ER.   Last read by Barbaraann Rondo Eisner at 4:56 PM on 05/12/2020.

## 2020-05-13 ENCOUNTER — Other Ambulatory Visit: Payer: Self-pay | Admitting: Family Medicine

## 2020-05-13 MED ORDER — HYDROCODONE-ACETAMINOPHEN 5-325 MG PO TABS: 1.0000 | ORAL_TABLET | Freq: Four times a day (QID) | ORAL | 0 refills | Status: DC | PRN

## 2020-05-13 MED ORDER — HYDROCODONE-ACETAMINOPHEN 5-325 MG PO TABS: 1.0000 | ORAL_TABLET | Freq: Four times a day (QID) | ORAL | 0 refills | Status: AC | PRN

## 2020-05-13 NOTE — Telephone Encounter (Signed)
Nurses Please connect with Delsa Bern Sorry to hear that he may well be sick with COVID.  Given the medications that he already has-and given that he is on Plavix and aspirin Tylenol is the safest.  If he should need a mild painkillers such as hydrocodone I could do that. Very important for him to monitor how his breathing is doing.  If he has a O2 sat meter if O2 sats dropped into the 80s that is a sign of a person needs to go to the ER If he feels he is having chest congestion increased coughing shortness of breath we could see him this afternoon with me.  Please also go over warning signs such as increasing shortness of breath chest pressure tightness pain-any of these should be followed up with [place] or if severe emergency department Please connect with rest and see what he would like to do

## 2020-05-13 NOTE — Telephone Encounter (Signed)
Discussed with pt. Pt verbalized understanding.  °

## 2020-05-13 NOTE — Telephone Encounter (Signed)
Has cough, temp today 98.7 down from 100. Does not feel bad. Just weak. Taking tylenol for joint pain. Would like to try hydrocodone for pain. No sob, pt declines appt right now. States if he feels worse he will call back.  [address].

## 2020-05-13 NOTE — Telephone Encounter (Signed)
Prescription was sent in as requested do not take with Dilaudid.  Follow-up if any ongoing troubles or problems thank you

## 2020-05-18 ENCOUNTER — Ambulatory Visit: Payer: Medicare Other | Admitting: Family Medicine

## 2020-05-19 ENCOUNTER — Other Ambulatory Visit: Payer: Self-pay

## 2020-05-19 ENCOUNTER — Ambulatory Visit (INDEPENDENT_AMBULATORY_CARE_PROVIDER_SITE_OTHER): Payer: Medicare Other | Admitting: Family Medicine

## 2020-05-19 DIAGNOSIS — U071 COVID-19: Secondary | ICD-10-CM

## 2020-05-19 NOTE — Progress Notes (Signed)
   Subjective:    Patient ID: [name], male    DOB: September 26, 1955, 65 y.o.   MRN: [id number]  HPI Pt tested positive for COVID 05/12/20. Pt would like provider to listen to lungs. Pt is also having nausea every time he eats.  Patient relates no shortness of breath with activity but his O2 saturations staying in the mid 90s.  No high fever no wheezing no severe cough moderate nausea.  Energy level low. Review of Systems See above    Objective:   Physical Exam  Heart regular lungs clear no crackles pulse normal extremities no edema skin warm dry  X-rays lab work not indicated currently    Assessment & Plan:  Covid infection Supportive measures No sign of pneumonia Follow-up if progressive troubles or worse O2 saturation today 95% warning signs were discussed

## 2020-05-21 ENCOUNTER — Other Ambulatory Visit: Payer: Self-pay | Admitting: Cardiology

## 2020-05-25 ENCOUNTER — Encounter: Payer: Self-pay | Admitting: Family Medicine

## 2020-05-25 ENCOUNTER — Other Ambulatory Visit: Payer: Self-pay | Admitting: Family Medicine

## 2020-05-25 MED ORDER — ALPRAZOLAM 0.5 MG PO TABS: ORAL_TABLET | ORAL | 1 refills | Status: DC

## 2020-05-27 ENCOUNTER — Ambulatory Visit (INDEPENDENT_AMBULATORY_CARE_PROVIDER_SITE_OTHER): Payer: Medicare Other | Admitting: Family Medicine

## 2020-05-27 ENCOUNTER — Encounter: Payer: Self-pay | Admitting: Family Medicine

## 2020-05-27 ENCOUNTER — Ambulatory Visit (HOSPITAL_COMMUNITY)
Admission: RE | Admit: 2020-05-27 | Discharge: 2020-05-27 | Disposition: A | Discharge status: Home / Self Care | Payer: Medicare Other | Source: Ambulatory Visit | Attending: Family Medicine | Admitting: Family Medicine

## 2020-05-27 ENCOUNTER — Other Ambulatory Visit: Payer: Self-pay

## 2020-05-27 VITALS — BP 122/70 | HR 65 | Temp 97.9°F | Ht 72.0 in | Wt 227.0 lb

## 2020-05-27 DIAGNOSIS — I700 Atherosclerosis of aorta: Secondary | ICD-10-CM

## 2020-05-27 DIAGNOSIS — M545 Low back pain, unspecified: Secondary | ICD-10-CM

## 2020-05-27 HISTORY — DX: Atherosclerosis of aorta: I70.0

## 2020-05-27 MED ORDER — PREDNISONE 20 MG PO TABS: ORAL_TABLET | ORAL | 0 refills | Status: DC

## 2020-05-27 NOTE — Progress Notes (Signed)
   Subjective:    Patient ID: [name], male    DOB: 12-21-1955, 65 y.o.   MRN: [id number]  HPIlow back pain for 6 days. Pt states he bent over and felt like something pulled. Tried stretching exercises, walking, heat, ice.  Patient states he had onset of severe back pain.  He stated he bent over and felt something severe in his back.  He does have underlying back disease as well as having a neurofibroma that was seen on a myelogram CT he has had previous surgeries he is under the care of Dr. Ellene Route. He has tried Dilaudid tried hydrocodone tried Tylenol and ibuprofen nothing helped tried cold compresses and warm compresses without help relates severe pain denies numbness in the lower legs but does have pain down into the hips bilateral Limited range of motion  Previous myelogram was looked at previous CT scan abdomen from 2019 which showed a normal size aorta Review of Systems Please see above    Objective:   Physical Exam  2 palpation lower back pain discomfort limited range of motion to percussion lower back pain patient moving around slowly.  Feels nauseated because of the pain.  Has nausea medicine at home.      Assessment & Plan:  Severe lumbar pain Recommend stat x-rays If these are negative next step would be a myelogram Prednisone taper Warning signs were discussed follow-up if progressive troubles

## 2020-06-09 ENCOUNTER — Ambulatory Visit (INDEPENDENT_AMBULATORY_CARE_PROVIDER_SITE_OTHER): Payer: Medicare Other | Admitting: Cardiology

## 2020-06-09 ENCOUNTER — Other Ambulatory Visit: Payer: Self-pay | Admitting: Cardiology

## 2020-06-09 ENCOUNTER — Telehealth: Payer: Self-pay | Admitting: Cardiology

## 2020-06-09 ENCOUNTER — Encounter: Payer: Self-pay | Admitting: Cardiology

## 2020-06-09 VITALS — BP 120/80 | HR 60 | Ht 72.0 in | Wt 228.6 lb

## 2020-06-09 DIAGNOSIS — I25119 Atherosclerotic heart disease of native coronary artery with unspecified angina pectoris: Secondary | ICD-10-CM | POA: Diagnosis not present

## 2020-06-09 DIAGNOSIS — R079 Chest pain, unspecified: Secondary | ICD-10-CM

## 2020-06-09 DIAGNOSIS — R0789 Other chest pain: Secondary | ICD-10-CM

## 2020-06-09 MED ORDER — SODIUM CHLORIDE 0.9% FLUSH: 3.0000 mL | Freq: Two times a day (BID) | INTRAVENOUS | Status: DC

## 2020-06-09 MED ORDER — ISOSORBIDE MONONITRATE ER 120 MG PO TB24: ORAL_TABLET | ORAL | 1 refills | Status: DC

## 2020-06-09 NOTE — Patient Instructions (Signed)
Your physician recommends that you schedule a follow-up appointment in: [place] has recommended you make the following change in your medication:   INCREASE IMDUR 180 MG DAILY (1 AND 1/2 TABLETS)     [place] [address] Dept: [phone] Loc: [place]  06/09/2020  You are scheduled for a Cardiac Catheterization on Tuesday, March 1 with Dr. Larae Grooms.  1. Please arrive at the [facility] (Main Entrance A) at [facility]: [address] [place], [place] [address] at 8:30 AM (This time is two hours before your procedure to ensure your preparation). Free valet parking service is available.   Special note: Every effort is made to have your procedure done on time. Please understand that emergencies sometimes delay scheduled procedures.  2. Diet: Do not eat solid foods after midnight.  The patient may have clear liquids until 5am upon the day of the procedure.  3. Labs: You will need to have blood drawn on Friday, February 25 at [place]. [address], [place]  Open: 7am - 6pm, Sat 8am - 12 noon   Phone: [phone]. You do not need to be fasting.  4. Medication instructions in preparation for your procedure:   Contrast Allergy: No    On the morning of your procedure, take your Aspirin and any morning medicines NOT listed above.  You may use sips of water.  5. Plan for one night stay--bring personal belongings. 6. Bring a current list of your medications and current insurance cards. 7. You MUST have a responsible person to drive you home. 8. Someone MUST be with you the first 24 hours after you arrive home or your discharge will be delayed. 9. Please wear clothes that are easy to get on and off and wear slip-on shoes.  Thank you for allowing [place] to care for you!   -- [facility]

## 2020-06-09 NOTE — Progress Notes (Signed)
Clinical Summary [name] is a 65 y.o.male seen today for follow up of the following medical problems.    1. CAD - history of DES to LAD in 08/2018, PCI to RCA in 2014  06/2019 echo: LVEF 60-65%, no WMAs, grade I DDx,  - 09/2019 nuclear stress: small apical infarct with mild peri-infarct ischemia  - recent chest pains. Midchest pain that goes to back, left shoulder. Tightness like feeling. Often brought on with stress. Mild to moderate discomfort. Can have some SOB associated. Can last 24 hours constant. Better with xanax. Not positional. No relation to food or eating.   - has had some recent chest pains - epigastric radiates up chest into neck.  - can at times can come on with exertion. +SOB. Stops and pain resolves in 5 minutes. - can also occur at rest. Not positional. - can walk 1-2 miles at times without symptoms.  - essentially exertional symptoms with moderate or higher exertion - some component is constant.  - progressing DOE.     2. Complete heart blocker - pacemaker followed by Dr Rayann Heman, CRT-P - Jan 2022 normal device check  3. HTN - compliant with meds  4. Hyperlipidemia 01/2020 TC 145 TG 76 HDL 56 LDL 74      SH; just sold his house, was able to get a great price and only on market for a week. Living in a fifth wheel for the time being.    Past Medical History:  Diagnosis Date  . ADD (attention deficit disorder)    Rx-Adderall  . Anginal pain ([place])   . Aortic atherosclerosis ([place]) 05/27/2020   Seen on x-ray being treated with statin  . AV block, 3rd degree (HCC)   . Colon polyps 2011   tubular adenoma by excisional biopsy during colonoscopy  . Complete heart block [facility])    a. s/p PPM Placement in 12/2012 with Medtronic CRT-P placement in 03/2014  . Coronary artery disease    a. s/p PCI of the RCA in 2014 b. 08/2018: patent RCA stent with new mid-LAD 85% stenosis (treated with PCI/DES) and 85% distal OM2 stenosis (med management  recommended)  . Degenerative disc disease, cervical   . Gastrointestinal bleeding 10/15/2017  . GERD (gastroesophageal reflux disease)   . Hypertension   . Pacemaker   . Palpitations 2003   Holter in 2003: PACs and PVCs; negative stress nuclear test in 2005; right bundle Francia Verry block; echo in 2008-mild LVH, otherwise normal; 2008-negative stress nuclear test.  . Pneumonia   . Prostatitis    prostate calcifications by CT  . Sleep apnea    questionable diagnosis     Allergies  Allergen Reactions  . Morphine And Related Itching and Rash    redness  . Lasix [Furosemide]     Chest pain and tightness  . Latex Rash     Current Outpatient Medications  Medication Sig Dispense Refill  . albuterol (VENTOLIN HFA) 108 (90 Base) MCG/ACT inhaler Inhale 2 puffs into the lungs every 4 (four) hours as needed for wheezing. 18 g 2  . ALPRAZolam (XANAX) 0.5 MG tablet 1/2-1 twice daily as needed home use only use sparingly 20 tablet 1  . amLODipine (NORVASC) 10 MG tablet Take 1 tablet (10 mg total) by mouth daily. 90 tablet 1  . aspirin 81 MG chewable tablet Chew 81 mg by mouth daily.    . clopidogrel (PLAVIX) 75 MG tablet THE DAY AFTER STOPPING BRILINTA, TAKE 4 TABLETS BY MOUTH ONCE,  THEN BEGIN TAKING ONE TABLET ONCE DAILY THEREAFTER 90 tablet 3  . cyclobenzaprine (FLEXERIL) 5 MG tablet Take 5 mg by mouth at bedtime as needed for muscle spasms.     Marland Kitchen esomeprazole (NEXIUM) 40 MG capsule TAKE 1 CAPSULE BY MOUTH EVERY DAY 30 capsule 2  . hydrALAZINE (APRESOLINE) 25 MG tablet Take 25 mg by mouth as directed. 25 mg mid day     . hydrALAZINE (APRESOLINE) 50 MG tablet Take 1 tablet (50 mg total) by mouth in the morning and at bedtime. 180 tablet 3  . HYDROmorphone (DILAUDID) 4 MG tablet Take 1 tablet (4 mg total) by mouth every 6 (six) hours as needed for severe pain. 20 tablet 0  . isosorbide mononitrate (IMDUR) 120 MG 24 hr tablet Take 1 tablet (120 mg total) by mouth daily. 90 tablet 3  . losartan  (COZAAR) 100 MG tablet TAKE 1 TABLET(100 MG) BY MOUTH DAILY 90 tablet 1  . nadolol (CORGARD) 80 MG tablet TAKE 1 TABLET(80 MG) BY MOUTH DAILY 90 tablet 1  . nitroGLYCERIN (NITROSTAT) 0.4 MG SL tablet Place 1 tablet (0.4 mg total) under the tongue every 5 (five) minutes as needed for chest pain (MAX 3 TABLETS). 25 tablet 3  . predniSONE (DELTASONE) 20 MG tablet 3qd for 3d then 2qd for 3d then 1qd for 3d 18 tablet 0  . promethazine (PHENERGAN) 25 MG tablet TAKE 1 TABLET BY MOUTH TWICE DAILY AS NEEDED FOR NAUSEA 30 tablet 5  . rosuvastatin (CRESTOR) 20 MG tablet TAKE 1 TABLET(20 MG) BY MOUTH DAILY 90 tablet 1  . tamsulosin (FLOMAX) 0.4 MG CAPS capsule TAKE 1 CAPSULE BY MOUTH DAILY 90 capsule 1   No current facility-administered medications for this visit.     Past Surgical History:  Procedure Laterality Date  . ANTERIOR CERVICAL DECOMP/DISCECTOMY FUSION    . BACK SURGERY    . Biventricular pacemaker upgrade/ system revision  04/02/14   removal of previous atrial and ventricular leads with placement of a new MDT Consulta CRT-P system at [facility] by Dr Violet Baldy  . COLONOSCOPY    . COLONOSCOPY W/ POLYPECTOMY  2011   tubular adenoma  . CORONARY STENT INTERVENTION N/A 08/22/2018   Procedure: CORONARY STENT INTERVENTION;  Surgeon: Troy Sine, MD;  Location: [place] CV LAB;  Service: Cardiovascular;  Laterality: N/A;  . ESOPHAGOGASTRODUODENOSCOPY     Gastritis  . INSERT / REPLACE / REMOVE PACEMAKER  12/17/2012   MDT Adapta L implanted by Dr Rayann Heman for mobitz II second degree AV block  . KNEE SURGERY Right   . LEFT HEART CATH AND CORONARY ANGIOGRAPHY N/A 08/22/2018   Procedure: LEFT HEART CATH AND CORONARY ANGIOGRAPHY;  Surgeon: Troy Sine, MD;  Location: [place] [facility] LAB;  Service: Cardiovascular;  Laterality: N/A;  . LEFT HEART CATHETERIZATION WITH CORONARY ANGIOGRAM N/A 10/17/2012   Procedure: LEFT HEART CATHETERIZATION WITH CORONARY ANGIOGRAM;  Surgeon: Josue Hector, MD;  Location: [facility] CATH LAB;  Service: Cardiovascular;  Laterality: N/A;  . LEFT HEART CATHETERIZATION WITH CORONARY ANGIOGRAM N/A 12/17/2012   Procedure: LEFT HEART CATHETERIZATION WITH CORONARY ANGIOGRAM;  Surgeon: Peter M Martinique, MD;  Location: [facility] CATH LAB;  Service: Cardiovascular;  Laterality: N/A;  . LEFT HEART CATHETERIZATION WITH CORONARY ANGIOGRAM N/A 06/11/2013   Procedure: LEFT HEART CATHETERIZATION WITH CORONARY ANGIOGRAM;  Surgeon: Wellington Hampshire, MD;  Location: [facility] CATH LAB;  Service: Cardiovascular;  Laterality: N/A;  . PERCUTANEOUS CORONARY STENT INTERVENTION (PCI-S)  10/17/2012   Procedure:  PERCUTANEOUS CORONARY STENT INTERVENTION (PCI-S);  Surgeon: Josue Hector, MD;  Location: [facility] CATH LAB;  Service: Cardiovascular;;  . PERMANENT PACEMAKER INSERTION N/A 12/17/2012   Procedure: PERMANENT PACEMAKER INSERTION;  Surgeon: Thompson Grayer, MD;  Location: [facility] CATH LAB;  Service: Cardiovascular;  Laterality: N/A;  . POLYPECTOMY    . UPPER GASTROINTESTINAL ENDOSCOPY       Allergies  Allergen Reactions  . Morphine And Related Itching and Rash    redness  . Lasix [Furosemide]     Chest pain and tightness  . Latex Rash      Family History  Problem Relation Age of Onset  . Heart disease Mother   . Hypertension Mother        Carotid disease  . Prostate cancer Brother   . Breast cancer Maternal Aunt   . Colon cancer Neg Hx   . Colon polyps Neg Hx      Social History [name] reports that he quit smoking about 16 years ago. His smoking use included cigarettes. He started smoking about 53 years ago. He has a 100.00 pack-year smoking history. He has quit using smokeless tobacco. [name] reports current alcohol use of about 1.0 standard drink of alcohol per week.   Review of Systems CONSTITUTIONAL: No weight loss, fever, chills, weakness or fatigue.  HEENT: Eyes: No visual loss, blurred vision, double vision or yellow sclerae.No hearing loss, sneezing, congestion, runny  nose or sore throat.  SKIN: No rash or itching.  CARDIOVASCULAR: per hpi RESPIRATORY: per hpi GASTROINTESTINAL: No anorexia, nausea, vomiting or diarrhea. No abdominal pain or blood.  GENITOURINARY: No burning on urination, no polyuria NEUROLOGICAL: No headache, dizziness, syncope, paralysis, ataxia, numbness or tingling in the extremities. No change in bowel or bladder control.  MUSCULOSKELETAL: No muscle, back pain, joint pain or stiffness.  LYMPHATICS: No enlarged nodes. No history of splenectomy.  PSYCHIATRIC: No history of depression or anxiety.  ENDOCRINOLOGIC: No reports of sweating, cold or heat intolerance. No polyuria or polydipsia.  Marland Kitchen   Physical Examination Today's Vitals   06/09/20 1047  BP: 120/80  Pulse: 60  SpO2: 97%  Weight: 228 lb 9.6 oz (103.7 kg)  Height: 6' (1.829 m)   Body mass index is 31 kg/m.  Gen: resting comfortably, no acute distress HEENT: no scleral icterus, pupils equal round and reactive, no palptable cervical adenopathy,  CV: RRR, no m/rg, no jvd Resp: Clear to auscultation bilaterally GI: abdomen is soft, non-tender, non-distended, normal bowel sounds, no hepatosplenomegaly MSK: extremities are warm, no edema.  Skin: warm, no rash Neuro:  no focal deficits Psych: appropriate affect   Diagnostic Studies  09/2019 nuclear stress  Findings consistent with prior small apical myocardial infarction with mild peri-infarct ischemia.  This is an intermediate risk study. Risk is based on decreased LVEF, consider correlating LVEF with echo.  The left ventricular ejection fraction is mildly decreased (43%).    Assessment and Plan  1. CAD - recent chest pain symptoms. Mixed in charactersitics but there is a significnat component of exertional chest pains.  - 09/2019 stress test mild apical ischemia, significant progression of symptoms since then - will plan for cath to further evaluate - appears remains on DAPT since his prior stent, continue for  now.  - EKG today is AV paced   I have reviewed the risks, indications, and alternatives to cardiac catheterization, possible angioplasty, and stenting with the patient today. Risks include but are not limited to bleeding, infection, vascular injury, stroke, myocardial infection,  arrhythmia, kidney injury, radiation-related injury in the case of prolonged fluoroscopy use, emergency cardiac surgery, and death. The patient understands the risks of serious complication is 1-2 in 8588 with diagnostic cardiac cath and 1-2% or less with angioplasty/stenting.        [name], M.D.

## 2020-06-09 NOTE — Telephone Encounter (Signed)
Pre-cert Verification for the following procedure    LHC 3/1 @ 10:30AM VARANASI

## 2020-06-09 NOTE — H&P (View-Only) (Signed)
Clinical Summary [name] is a 65 y.o.male seen today for follow up of the following medical problems.    1. CAD - history of DES to LAD in 08/2018, PCI to RCA in 2014  06/2019 echo: LVEF 60-65%, no WMAs, grade I DDx,  - 09/2019 nuclear stress: small apical infarct with mild peri-infarct ischemia  - recent chest pains. Midchest pain that goes to back, left shoulder. Tightness like feeling. Often brought on with stress. Mild to moderate discomfort. Can have some SOB associated. Can last 24 hours constant. Better with xanax. Not positional. No relation to food or eating.   - has had some recent chest pains - epigastric radiates up chest into neck.  - can at times can come on with exertion. +SOB. Stops and pain resolves in 5 minutes. - can also occur at rest. Not positional. - can walk 1-2 miles at times without symptoms.  - essentially exertional symptoms with moderate or higher exertion - some component is constant.  - progressing DOE.     2. Complete heart blocker - pacemaker followed by Dr Rayann Heman, CRT-P - Jan 2022 normal device check  3. HTN - compliant with meds  4. Hyperlipidemia 01/2020 TC 145 TG 76 HDL 56 LDL 74      SH; just sold his house, was able to get a great price and only on market for a week. Living in a fifth wheel for the time being.    Past Medical History:  Diagnosis Date  . ADD (attention deficit disorder)    Rx-Adderall  . Anginal pain ([place])   . Aortic atherosclerosis ([place]) 05/27/2020   Seen on x-ray being treated with statin  . AV block, 3rd degree (HCC)   . Colon polyps 2011   tubular adenoma by excisional biopsy during colonoscopy  . Complete heart block [facility])    a. s/p PPM Placement in 12/2012 with Medtronic CRT-P placement in 03/2014  . Coronary artery disease    a. s/p PCI of the RCA in 2014 b. 08/2018: patent RCA stent with new mid-LAD 85% stenosis (treated with PCI/DES) and 85% distal OM2 stenosis (med management  recommended)  . Degenerative disc disease, cervical   . Gastrointestinal bleeding 10/15/2017  . GERD (gastroesophageal reflux disease)   . Hypertension   . Pacemaker   . Palpitations 2003   Holter in 2003: PACs and PVCs; negative stress nuclear test in 2005; right bundle Trishna Cwik block; echo in 2008-mild LVH, otherwise normal; 2008-negative stress nuclear test.  . Pneumonia   . Prostatitis    prostate calcifications by CT  . Sleep apnea    questionable diagnosis     Allergies  Allergen Reactions  . Morphine And Related Itching and Rash    redness  . Lasix [Furosemide]     Chest pain and tightness  . Latex Rash     Current Outpatient Medications  Medication Sig Dispense Refill  . albuterol (VENTOLIN HFA) 108 (90 Base) MCG/ACT inhaler Inhale 2 puffs into the lungs every 4 (four) hours as needed for wheezing. 18 g 2  . ALPRAZolam (XANAX) 0.5 MG tablet 1/2-1 twice daily as needed home use only use sparingly 20 tablet 1  . amLODipine (NORVASC) 10 MG tablet Take 1 tablet (10 mg total) by mouth daily. 90 tablet 1  . aspirin 81 MG chewable tablet Chew 81 mg by mouth daily.    . clopidogrel (PLAVIX) 75 MG tablet THE DAY AFTER STOPPING BRILINTA, TAKE 4 TABLETS BY MOUTH ONCE,  THEN BEGIN TAKING ONE TABLET ONCE DAILY THEREAFTER 90 tablet 3  . cyclobenzaprine (FLEXERIL) 5 MG tablet Take 5 mg by mouth at bedtime as needed for muscle spasms.     Marland Kitchen esomeprazole (NEXIUM) 40 MG capsule TAKE 1 CAPSULE BY MOUTH EVERY DAY 30 capsule 2  . hydrALAZINE (APRESOLINE) 25 MG tablet Take 25 mg by mouth as directed. 25 mg mid day     . hydrALAZINE (APRESOLINE) 50 MG tablet Take 1 tablet (50 mg total) by mouth in the morning and at bedtime. 180 tablet 3  . HYDROmorphone (DILAUDID) 4 MG tablet Take 1 tablet (4 mg total) by mouth every 6 (six) hours as needed for severe pain. 20 tablet 0  . isosorbide mononitrate (IMDUR) 120 MG 24 hr tablet Take 1 tablet (120 mg total) by mouth daily. 90 tablet 3  . losartan  (COZAAR) 100 MG tablet TAKE 1 TABLET(100 MG) BY MOUTH DAILY 90 tablet 1  . nadolol (CORGARD) 80 MG tablet TAKE 1 TABLET(80 MG) BY MOUTH DAILY 90 tablet 1  . nitroGLYCERIN (NITROSTAT) 0.4 MG SL tablet Place 1 tablet (0.4 mg total) under the tongue every 5 (five) minutes as needed for chest pain (MAX 3 TABLETS). 25 tablet 3  . predniSONE (DELTASONE) 20 MG tablet 3qd for 3d then 2qd for 3d then 1qd for 3d 18 tablet 0  . promethazine (PHENERGAN) 25 MG tablet TAKE 1 TABLET BY MOUTH TWICE DAILY AS NEEDED FOR NAUSEA 30 tablet 5  . rosuvastatin (CRESTOR) 20 MG tablet TAKE 1 TABLET(20 MG) BY MOUTH DAILY 90 tablet 1  . tamsulosin (FLOMAX) 0.4 MG CAPS capsule TAKE 1 CAPSULE BY MOUTH DAILY 90 capsule 1   No current facility-administered medications for this visit.     Past Surgical History:  Procedure Laterality Date  . ANTERIOR CERVICAL DECOMP/DISCECTOMY FUSION    . BACK SURGERY    . Biventricular pacemaker upgrade/ system revision  04/02/14   removal of previous atrial and ventricular leads with placement of a new MDT Consulta CRT-P system at [facility] by Dr Violet Baldy  . COLONOSCOPY    . COLONOSCOPY W/ POLYPECTOMY  2011   tubular adenoma  . CORONARY STENT INTERVENTION N/A 08/22/2018   Procedure: CORONARY STENT INTERVENTION;  Surgeon: Troy Sine, MD;  Location: [place] CV LAB;  Service: Cardiovascular;  Laterality: N/A;  . ESOPHAGOGASTRODUODENOSCOPY     Gastritis  . INSERT / REPLACE / REMOVE PACEMAKER  12/17/2012   MDT Adapta L implanted by Dr Rayann Heman for mobitz II second degree AV block  . KNEE SURGERY Right   . LEFT HEART CATH AND CORONARY ANGIOGRAPHY N/A 08/22/2018   Procedure: LEFT HEART CATH AND CORONARY ANGIOGRAPHY;  Surgeon: Troy Sine, MD;  Location: [place] CV LAB;  Service: Cardiovascular;  Laterality: N/A;  . LEFT HEART CATHETERIZATION WITH CORONARY ANGIOGRAM N/A 10/17/2012   Procedure: LEFT HEART CATHETERIZATION WITH CORONARY ANGIOGRAM;  Surgeon: Josue Hector, MD;  Location: [facility] CATH LAB;  Service: Cardiovascular;  Laterality: N/A;  . LEFT HEART CATHETERIZATION WITH CORONARY ANGIOGRAM N/A 12/17/2012   Procedure: LEFT HEART CATHETERIZATION WITH CORONARY ANGIOGRAM;  Surgeon: Peter M Martinique, MD;  Location: [facility] CATH LAB;  Service: Cardiovascular;  Laterality: N/A;  . LEFT HEART CATHETERIZATION WITH CORONARY ANGIOGRAM N/A 06/11/2013   Procedure: LEFT HEART CATHETERIZATION WITH CORONARY ANGIOGRAM;  Surgeon: Wellington Hampshire, MD;  Location: [place] CATH LAB;  Service: Cardiovascular;  Laterality: N/A;  . PERCUTANEOUS CORONARY STENT INTERVENTION (PCI-S)  10/17/2012   Procedure:  PERCUTANEOUS CORONARY STENT INTERVENTION (PCI-S);  Surgeon: Josue Hector, MD;  Location: [facility] CATH LAB;  Service: Cardiovascular;;  . PERMANENT PACEMAKER INSERTION N/A 12/17/2012   Procedure: PERMANENT PACEMAKER INSERTION;  Surgeon: Thompson Grayer, MD;  Location: [facility] CATH LAB;  Service: Cardiovascular;  Laterality: N/A;  . POLYPECTOMY    . UPPER GASTROINTESTINAL ENDOSCOPY       Allergies  Allergen Reactions  . Morphine And Related Itching and Rash    redness  . Lasix [Furosemide]     Chest pain and tightness  . Latex Rash      Family History  Problem Relation Age of Onset  . Heart disease Mother   . Hypertension Mother        Carotid disease  . Prostate cancer Brother   . Breast cancer Maternal Aunt   . Colon cancer Neg Hx   . Colon polyps Neg Hx      Social History Mr. Swicegood reports that he quit smoking about 16 years ago. His smoking use included cigarettes. He started smoking about 53 years ago. He has a 100.00 pack-year smoking history. He has quit using smokeless tobacco. Mr. Goodner reports current alcohol use of about 1.0 standard drink of alcohol per week.   Review of Systems CONSTITUTIONAL: No weight loss, fever, chills, weakness or fatigue.  HEENT: Eyes: No visual loss, blurred vision, double vision or yellow sclerae.No hearing loss, sneezing, congestion, runny  nose or sore throat.  SKIN: No rash or itching.  CARDIOVASCULAR: per hpi RESPIRATORY: per hpi GASTROINTESTINAL: No anorexia, nausea, vomiting or diarrhea. No abdominal pain or blood.  GENITOURINARY: No burning on urination, no polyuria NEUROLOGICAL: No headache, dizziness, syncope, paralysis, ataxia, numbness or tingling in the extremities. No change in bowel or bladder control.  MUSCULOSKELETAL: No muscle, back pain, joint pain or stiffness.  LYMPHATICS: No enlarged nodes. No history of splenectomy.  PSYCHIATRIC: No history of depression or anxiety.  ENDOCRINOLOGIC: No reports of sweating, cold or heat intolerance. No polyuria or polydipsia.  Marland Kitchen   Physical Examination Today's Vitals   06/09/20 1047  BP: 120/80  Pulse: 60  SpO2: 97%  Weight: 228 lb 9.6 oz (103.7 kg)  Height: 6' (1.829 m)   Body mass index is 31 kg/m.  Gen: resting comfortably, no acute distress HEENT: no scleral icterus, pupils equal round and reactive, no palptable cervical adenopathy,  CV: RRR, no m/rg, no jvd Resp: Clear to auscultation bilaterally GI: abdomen is soft, non-tender, non-distended, normal bowel sounds, no hepatosplenomegaly MSK: extremities are warm, no edema.  Skin: warm, no rash Neuro:  no focal deficits Psych: appropriate affect   Diagnostic Studies  09/2019 nuclear stress  Findings consistent with prior small apical myocardial infarction with mild peri-infarct ischemia.  This is an intermediate risk study. Risk is based on decreased LVEF, consider correlating LVEF with echo.  The left ventricular ejection fraction is mildly decreased (43%).    Assessment and Plan  1. CAD - recent chest pain symptoms. Mixed in charactersitics but there is a significnat component of exertional chest pains.  - 09/2019 stress test mild apical ischemia, significant progression of symptoms since then - will plan for cath to further evaluate - appears remains on DAPT since his prior stent, continue for  now.  - EKG today is AV paced   I have reviewed the risks, indications, and alternatives to cardiac catheterization, possible angioplasty, and stenting with the patient today. Risks include but are not limited to bleeding, infection, vascular injury, stroke, myocardial infection,  arrhythmia, kidney injury, radiation-related injury in the case of prolonged fluoroscopy use, emergency cardiac surgery, and death. The patient understands the risks of serious complication is 1-2 in 1642 with diagnostic cardiac cath and 1-2% or less with angioplasty/stenting.        Arnoldo Lenis, M.D.

## 2020-06-11 ENCOUNTER — Other Ambulatory Visit: Payer: Self-pay | Admitting: Family Medicine

## 2020-06-11 ENCOUNTER — Other Ambulatory Visit: Payer: Self-pay | Admitting: Cardiology

## 2020-06-11 ENCOUNTER — Other Ambulatory Visit: Payer: Self-pay

## 2020-06-11 ENCOUNTER — Other Ambulatory Visit (HOSPITAL_COMMUNITY)
Admission: RE | Admit: 2020-06-11 | Discharge: 2020-06-11 | Disposition: A | Discharge status: Home / Self Care | Payer: Medicare Other | Source: Ambulatory Visit | Attending: Interventional Cardiology | Admitting: Interventional Cardiology

## 2020-06-11 DIAGNOSIS — Z01812 Encounter for preprocedural laboratory examination: Secondary | ICD-10-CM | POA: Insufficient documentation

## 2020-06-11 DIAGNOSIS — Z20822 Contact with and (suspected) exposure to covid-19: Secondary | ICD-10-CM | POA: Insufficient documentation

## 2020-06-11 DIAGNOSIS — R0789 Other chest pain: Secondary | ICD-10-CM | POA: Diagnosis not present

## 2020-06-11 LAB — SARS CORONAVIRUS 2 (TAT 6-24 HRS): SARS Coronavirus 2: NEGATIVE

## 2020-06-12 LAB — BASIC METABOLIC PANEL WITH GFR
BUN: 16 mg/dL (ref 7–25)
CO2: 30 mmol/L (ref 20–32)
Calcium: 9.4 mg/dL (ref 8.6–10.3)
Chloride: 106 mmol/L (ref 98–110)
Creat: 1 mg/dL (ref 0.70–1.25)
GFR, Est African American: 91 mL/min/{1.73_m2} (ref >=60)
GFR, Est Non African American: 79 mL/min/{1.73_m2} (ref >=60)
Glucose, Bld: 146 mg/dL — ABNORMAL HIGH (ref 65–99)
Potassium: 4.7 mmol/L (ref 3.5–5.3)
Sodium: 144 mmol/L (ref 135–146)

## 2020-06-12 LAB — CBC WITH DIFFERENTIAL/PLATELET
Absolute Monocytes: 649 cells/uL (ref 200–950)
Basophils Absolute: 48 cells/uL (ref 0–200)
Basophils Relative: 0.7 %
Eosinophils Absolute: 159 cells/uL (ref 15–500)
Eosinophils Relative: 2.3 %
HCT: 45.8 % (ref 38.5–50.0)
Hemoglobin: 15.4 g/dL (ref 13.2–17.1)
Lymphs Abs: 1511 cells/uL (ref 850–3900)
MCH: 29.8 pg (ref 27.0–33.0)
MCHC: 33.6 g/dL (ref 32.0–36.0)
MCV: 88.8 fL (ref 80.0–100.0)
MPV: 11 fL (ref 7.5–12.5)
Monocytes Relative: 9.4 %
Neutro Abs: 4533 cells/uL (ref 1500–7800)
Neutrophils Relative %: 65.7 %
Platelets: 180 10*3/uL (ref 140–400)
RBC: 5.16 10*6/uL (ref 4.20–5.80)
RDW: 12.9 % (ref 11.0–15.0)
Total Lymphocyte: 21.9 %
WBC: 6.9 10*3/uL (ref 3.8–10.8)

## 2020-06-14 ENCOUNTER — Telehealth: Payer: Self-pay | Admitting: *Deleted

## 2020-06-14 ENCOUNTER — Encounter: Payer: Self-pay | Admitting: Family Medicine

## 2020-06-14 NOTE — Telephone Encounter (Signed)
Pt contacted pre-catheterization scheduled at [facility] for: Tuesday June 15, 2020 10:30 AM Verified arrival time and place: [facility]) at: 8:30 AM   No solid food after midnight prior to cath, clear liquids until 5 AM day of procedure.   AM meds can be  taken pre-cath with sips of water including: ASA 81 mg Plavix 75 mg  Confirmed patient has responsible adult to drive home post procedure and be with patient first 24 hours after arriving home: yes  You are allowed ONE visitor in the waiting room during the time you are at the hospital for your procedure. Both you and your visitor must wear a mask once you enter the hospital.   Reviewed procedure/mask/visitor instructions with patient.

## 2020-06-15 ENCOUNTER — Ambulatory Visit (HOSPITAL_COMMUNITY)
Admission: RE | Admit: 2020-06-15 | Discharge: 2020-06-15 | Disposition: A | Discharge status: Home / Self Care | Payer: Medicare Other | Attending: Interventional Cardiology | Admitting: Interventional Cardiology

## 2020-06-15 ENCOUNTER — Other Ambulatory Visit: Payer: Self-pay | Admitting: *Deleted

## 2020-06-15 ENCOUNTER — Other Ambulatory Visit: Payer: Self-pay

## 2020-06-15 ENCOUNTER — Encounter (HOSPITAL_COMMUNITY)
Admission: RE | Disposition: A | Discharge status: Home / Self Care | Payer: Self-pay | Source: Home / Self Care | Attending: Interventional Cardiology

## 2020-06-15 DIAGNOSIS — I442 Atrioventricular block, complete: Secondary | ICD-10-CM | POA: Insufficient documentation

## 2020-06-15 DIAGNOSIS — Z91040 Latex allergy status: Secondary | ICD-10-CM | POA: Diagnosis not present

## 2020-06-15 DIAGNOSIS — Z87891 Personal history of nicotine dependence: Secondary | ICD-10-CM | POA: Diagnosis not present

## 2020-06-15 DIAGNOSIS — I10 Essential (primary) hypertension: Secondary | ICD-10-CM

## 2020-06-15 DIAGNOSIS — Y832 Surgical operation with anastomosis, bypass or graft as the cause of abnormal reaction of the patient, or of later complication, without mention of misadventure at the time of the procedure: Secondary | ICD-10-CM | POA: Diagnosis not present

## 2020-06-15 DIAGNOSIS — R739 Hyperglycemia, unspecified: Secondary | ICD-10-CM

## 2020-06-15 DIAGNOSIS — Z9861 Coronary angioplasty status: Secondary | ICD-10-CM

## 2020-06-15 DIAGNOSIS — Z7982 Long term (current) use of aspirin: Secondary | ICD-10-CM | POA: Diagnosis not present

## 2020-06-15 DIAGNOSIS — T82855A Stenosis of coronary artery stent, initial encounter: Secondary | ICD-10-CM | POA: Insufficient documentation

## 2020-06-15 DIAGNOSIS — Z7902 Long term (current) use of antithrombotics/antiplatelets: Secondary | ICD-10-CM | POA: Insufficient documentation

## 2020-06-15 DIAGNOSIS — Z79899 Other long term (current) drug therapy: Secondary | ICD-10-CM | POA: Diagnosis not present

## 2020-06-15 DIAGNOSIS — I25118 Atherosclerotic heart disease of native coronary artery with other forms of angina pectoris: Secondary | ICD-10-CM | POA: Diagnosis not present

## 2020-06-15 DIAGNOSIS — Z885 Allergy status to narcotic agent status: Secondary | ICD-10-CM | POA: Insufficient documentation

## 2020-06-15 DIAGNOSIS — Z955 Presence of coronary angioplasty implant and graft: Secondary | ICD-10-CM | POA: Insufficient documentation

## 2020-06-15 DIAGNOSIS — Z888 Allergy status to other drugs, medicaments and biological substances status: Secondary | ICD-10-CM | POA: Diagnosis not present

## 2020-06-15 DIAGNOSIS — E785 Hyperlipidemia, unspecified: Secondary | ICD-10-CM | POA: Insufficient documentation

## 2020-06-15 DIAGNOSIS — R079 Chest pain, unspecified: Secondary | ICD-10-CM | POA: Diagnosis present

## 2020-06-15 DIAGNOSIS — Z125 Encounter for screening for malignant neoplasm of prostate: Secondary | ICD-10-CM

## 2020-06-15 HISTORY — PX: CORONARY ULTRASOUND/IVUS: CATH118244

## 2020-06-15 HISTORY — PX: LEFT HEART CATH AND CORONARY ANGIOGRAPHY: CATH118249

## 2020-06-15 HISTORY — PX: CORONARY BALLOON ANGIOPLASTY: CATH118233

## 2020-06-15 LAB — POCT ACTIVATED CLOTTING TIME: Activated Clotting Time: 303 seconds

## 2020-06-15 SURGERY — LEFT HEART CATH AND CORONARY ANGIOGRAPHY
Anesthesia: LOCAL

## 2020-06-15 MED ORDER — SODIUM CHLORIDE 0.9 % IV SOLN: INTRAVENOUS | Status: AC

## 2020-06-15 MED ORDER — LABETALOL HCL 5 MG/ML IV SOLN
10.0000 mg | INTRAVENOUS | Status: DC | PRN
Start: 2020-06-15 — End: 2020-06-15

## 2020-06-15 MED ORDER — ACETAMINOPHEN 325 MG PO TABS: 650.0000 mg | ORAL_TABLET | ORAL | Status: DC | PRN

## 2020-06-15 MED ORDER — IOHEXOL 350 MG/ML SOLN
INTRAVENOUS | Status: DC | PRN
  Administered 2020-06-15: 95 mL

## 2020-06-15 MED ORDER — SODIUM CHLORIDE 0.9 % WEIGHT BASED INFUSION
3.0000 mL/kg/h | INTRAVENOUS | Status: AC
  Administered 2020-06-15: 3 mL/kg/h via INTRAVENOUS

## 2020-06-15 MED ORDER — FENTANYL CITRATE (PF) 100 MCG/2ML IJ SOLN
INTRAMUSCULAR | Status: AC
  Filled 2020-06-15: qty 2

## 2020-06-15 MED ORDER — SODIUM CHLORIDE 0.9% FLUSH: 3.0000 mL | INTRAVENOUS | Status: DC | PRN

## 2020-06-15 MED ORDER — HEPARIN (PORCINE) IN NACL 1000-0.9 UT/500ML-% IV SOLN
INTRAVENOUS | Status: AC
  Filled 2020-06-15: qty 1000

## 2020-06-15 MED ORDER — HEPARIN SODIUM (PORCINE) 1000 UNIT/ML IJ SOLN
INTRAMUSCULAR | Status: DC | PRN
  Administered 2020-06-15: 9000 [IU] via INTRAVENOUS

## 2020-06-15 MED ORDER — MIDAZOLAM HCL 2 MG/2ML IJ SOLN
INTRAMUSCULAR | Status: AC
  Filled 2020-06-15: qty 2

## 2020-06-15 MED ORDER — NITROGLYCERIN 0.4 MG SL SUBL
SUBLINGUAL_TABLET | SUBLINGUAL | Status: AC
  Filled 2020-06-15: qty 2

## 2020-06-15 MED ORDER — ASPIRIN 81 MG PO CHEW: 81.0000 mg | CHEWABLE_TABLET | ORAL | Status: DC

## 2020-06-15 MED ORDER — SODIUM CHLORIDE 0.9 % IV SOLN: 250.0000 mL | INTRAVENOUS | Status: DC | PRN

## 2020-06-15 MED ORDER — HYDRALAZINE HCL 20 MG/ML IJ SOLN
INTRAMUSCULAR | Status: DC | PRN
  Administered 2020-06-15: 10 mg via INTRAVENOUS

## 2020-06-15 MED ORDER — MIDAZOLAM HCL 2 MG/2ML IJ SOLN
INTRAMUSCULAR | Status: DC | PRN
  Administered 2020-06-15: 2 mg via INTRAVENOUS
  Administered 2020-06-15 (×2): 1 mg via INTRAVENOUS

## 2020-06-15 MED ORDER — SODIUM CHLORIDE 0.9 % WEIGHT BASED INFUSION: 1.0000 mL/kg/h | INTRAVENOUS | Status: DC

## 2020-06-15 MED ORDER — LIDOCAINE HCL (PF) 1 % IJ SOLN
INTRAMUSCULAR | Status: AC
  Filled 2020-06-15: qty 30

## 2020-06-15 MED ORDER — FENTANYL CITRATE (PF) 100 MCG/2ML IJ SOLN
INTRAMUSCULAR | Status: DC | PRN
  Administered 2020-06-15 (×3): 25 ug via INTRAVENOUS

## 2020-06-15 MED ORDER — NITROGLYCERIN 1 MG/10 ML FOR IR/CATH LAB
INTRA_ARTERIAL | Status: AC
  Filled 2020-06-15: qty 10

## 2020-06-15 MED ORDER — SODIUM CHLORIDE 0.9% FLUSH
3.0000 mL | INTRAVENOUS | Status: DC | PRN
Start: 2020-06-15 — End: 2020-06-15

## 2020-06-15 MED ORDER — ASPIRIN 81 MG PO CHEW: 81.0000 mg | CHEWABLE_TABLET | Freq: Every day | ORAL | Status: DC

## 2020-06-15 MED ORDER — HEPARIN SODIUM (PORCINE) 1000 UNIT/ML IJ SOLN
INTRAMUSCULAR | Status: AC
  Filled 2020-06-15: qty 1

## 2020-06-15 MED ORDER — LIDOCAINE HCL (PF) 1 % IJ SOLN
INTRAMUSCULAR | Status: DC | PRN
  Administered 2020-06-15: 20 mL

## 2020-06-15 MED ORDER — CLOPIDOGREL BISULFATE 75 MG PO TABS: 75.0000 mg | ORAL_TABLET | Freq: Every day | ORAL | Status: DC

## 2020-06-15 MED ORDER — HEPARIN (PORCINE) IN NACL 1000-0.9 UT/500ML-% IV SOLN
INTRAVENOUS | Status: DC | PRN
  Administered 2020-06-15 (×2): 500 mL

## 2020-06-15 MED ORDER — HYDRALAZINE HCL 20 MG/ML IJ SOLN
INTRAMUSCULAR | Status: AC
  Filled 2020-06-15: qty 1

## 2020-06-15 MED ORDER — CLOPIDOGREL BISULFATE 75 MG PO TABS
75.0000 mg | ORAL_TABLET | Freq: Once | ORAL | Status: AC
  Administered 2020-06-15: 75 mg via ORAL
  Filled 2020-06-15: qty 1

## 2020-06-15 MED ORDER — HYDRALAZINE HCL 20 MG/ML IJ SOLN: 10.0000 mg | INTRAMUSCULAR | Status: DC | PRN

## 2020-06-15 MED ORDER — SODIUM CHLORIDE 0.9% FLUSH: 3.0000 mL | Freq: Two times a day (BID) | INTRAVENOUS | Status: DC

## 2020-06-15 MED ORDER — ONDANSETRON HCL 4 MG/2ML IJ SOLN: 4.0000 mg | Freq: Four times a day (QID) | INTRAMUSCULAR | Status: DC | PRN

## 2020-06-15 SURGICAL SUPPLY — 21 items
BALLN SAPPHIRE ~~LOC~~ 3.5X15 (BALLOONS) ×2 IMPLANT
BALLN WOLVERINE 2.75X10 (BALLOONS) ×2
BALLOON WOLVERINE 2.75X10 (BALLOONS) ×1 IMPLANT
CATH INFINITI 5FR AL1 (CATHETERS) ×2 IMPLANT
CATH INFINITI 5FR MULTPACK ANG (CATHETERS) ×2 IMPLANT
CATH LAUNCHER 6FR AL1 (CATHETERS) ×1 IMPLANT
CATH OPTICROSS HD (CATHETERS) ×2 IMPLANT
CATHETER LAUNCHER 6FR AL1 (CATHETERS) ×2
CLOSURE MYNX CONTROL 6F/7F (Vascular Products) ×2 IMPLANT
KIT ENCORE 26 ADVANTAGE (KITS) ×2 IMPLANT
KIT HEART LEFT (KITS) ×2 IMPLANT
KIT HEMO VALVE WATCHDOG (MISCELLANEOUS) ×2 IMPLANT
PACK CARDIAC CATHETERIZATION (CUSTOM PROCEDURE TRAY) ×2 IMPLANT
SHEATH PINNACLE 5F 10CM (SHEATH) ×2 IMPLANT
SHEATH PINNACLE 6F 10CM (SHEATH) ×2 IMPLANT
SHEATH PROBE COVER 6X72 (BAG) ×2 IMPLANT
SLED PULL BACK IVUS (MISCELLANEOUS) ×2 IMPLANT
TRANSDUCER W/STOPCOCK (MISCELLANEOUS) ×2 IMPLANT
TUBING CIL FLEX 10 FLL-RA (TUBING) ×2 IMPLANT
WIRE ASAHI PROWATER 180CM (WIRE) ×2 IMPLANT
WIRE EMERALD 3MM-J .035X150CM (WIRE) ×2 IMPLANT

## 2020-06-15 NOTE — Discharge Summary (Addendum)
Discharge Summary for Same Day PCI   Patient ID: [name] MRN: [id number]; DOB: June 07, 1955  Admit date: 06/15/2020 Discharge date: 06/15/2020  Primary Care Provider: Kathyrn Drown, MD  Primary Cardiologist: Carlyle Dolly, MD  Primary Electrophysiologist:  Thompson Grayer, MD   Discharge Diagnoses    Active Problems:   Chest pain, exertional  CAD HTN Hyperlipidemia Prior PCI  Diagnostic Studies/Procedures    Cardiac Catheterization 06/15/2020:    Previously placed Mid LAD stent (unknown type) is widely patent.  Prox LAD lesion is 50% stenosed.  Dist Cx lesion is 85% stenosed. THis is unchanged from prior and quite distal in a small vessel. Medical treatment.  Prox Cx lesion is 10% stenosed.  Ost Cx lesion is 10% stenosed.  Mid RCA lesion is 75% stenosed. IVUS revealed that this is a 3.5 vessel distally and a 4.0 mm vessel more proximally.  Balloon angioplasty was performed (within the old 2.5 mm stent from 2014) using a 2.75 [place] balloon followed by a BALLOON SAPPHIRE [place] 3.5X15 to high pressure.  Post intervention, there is a 0% residual stenosis. Followup IVUS revealed significantly better stent expansion.  The left ventricular systolic function is normal.  LV end diastolic pressure is normal.  The left ventricular ejection fraction is 55-65% by visual estimate.  There is no aortic valve stenosis.   Decrease Imdur from 180 to 120 mg.    Continue aggressive secondary prevention.     Could hold Plavix after 30 days since this was PTCA only.    Diagnostic Dominance: Right    Intervention     _____________   History of Present Illness     [name] is a 65 y.o. male with past medical history of coronary artery disease status post PCI of RCA '14, DES of the LAD 08/2018, hypertension, complete heart block status post PPM, hyperlipidemia who presented to the office on 2/23 of recent exertional chest pain.  Also had associated dyspnea.   States episodes were relieved with rest.  Concern for progressive coronary artery disease patient was set up for outpatient cardiac cath.   Hospital Course     The patient underwent cardiac cath as noted above with in-stent restenosis in mid RCA treated with balloon angioplasty.  Notable patent previously placed mid LAD stent. Plan for DAPT with ASA/Plavix for at least 6 months, but Plavix can be held after 1 month if needed given he only underwent balloon angioplasty. The patient was seen by cardiac rehab while in short stay. Did have brief episode of hypertension which resolved quickly. There were no observed complications post cath.  Femoral cath site was re-evaluated prior to discharge and found to be stable without any complications.  Of note Mynx closure device was utilized. Instructions/precautions regarding cath site care were given prior to discharge.  Maijor Hornig Heng was seen by Dr. Irish Lack and determined stable for discharge home. Follow up with our office has been arranged. Medications are listed below. Pertinent changes include reducing Imdur from 180mg  to 120 mg daily.  _____________  Cath/PCI Registry Performance & Quality Measures: 1. Aspirin prescribed? - Yes 2. ADP Receptor Inhibitor (Plavix/Clopidogrel, Brilinta/Ticagrelor or Effient/Prasugrel) prescribed (includes medically managed patients)? - Yes 3. High Intensity Statin (Lipitor 40-80mg  or Crestor 20-40mg ) prescribed? - Yes 4. For EF <40%, was ACEI/ARB prescribed? - Not Applicable (EF >/= 14%) 5. For EF <40%, Aldosterone Antagonist (Spironolactone or Eplerenone) prescribed? - Not Applicable (EF >/= 43%) 6. Cardiac Rehab Phase II ordered (Included Medically managed Patients)? -  Yes  _____________   Discharge Vitals Blood pressure 127/68, pulse 64, temperature 98 F (36.7 C), temperature source Oral, resp. rate (!) 21, SpO2 95 %.  There were no vitals filed for this visit.  Last Labs & Radiologic Studies    CBC No  results for input(s): WBC, NEUTROABS, HGB, HCT, MCV, PLT in the last 72 hours. Basic Metabolic Panel No results for input(s): NA, K, CL, CO2, GLUCOSE, BUN, CREATININE, CALCIUM, MG, PHOS in the last 72 hours. Liver Function Tests No results for input(s): AST, ALT, ALKPHOS, BILITOT, PROT, ALBUMIN in the last 72 hours. No results for input(s): LIPASE, AMYLASE in the last 72 hours. High Sensitivity Troponin:   No results for input(s): TROPONINIHS in the last 720 hours.  BNP Invalid input(s): POCBNP D-Dimer No results for input(s): DDIMER in the last 72 hours. Hemoglobin A1C No results for input(s): HGBA1C in the last 72 hours. Fasting Lipid Panel No results for input(s): CHOL, HDL, LDLCALC, TRIG, CHOLHDL, LDLDIRECT in the last 72 hours. Thyroid Function Tests No results for input(s): TSH, T4TOTAL, T3FREE, THYROIDAB in the last 72 hours.  Invalid input(s): FREET3 _____________  DG Lumbar Spine Complete  Result Date: 05/27/2020 CLINICAL DATA:  Low back pain EXAM: LUMBAR SPINE - COMPLETE 4+ VIEW COMPARISON:  02/20/2017 FINDINGS: Normal alignment. Diffuse lower lumbar disc space narrowing with endplate bony spurring anteriorly at L3-L5. Facets are aligned. Preserved vertebral body heights. No acute compression fracture, wedge-shaped deformity or focal kyphosis. No pars defects. Normal pedicles and SI joints for age. Nonobstructive bowel gas pattern.  Aorta atherosclerotic. IMPRESSION: Lumbar spine endplate degenerative changes. No acute finding by plain radiography Aortic Atherosclerosis (ICD10-I70.0). Electronically Signed   By: Jerilynn Mages.  Shick M.D.   On: 05/27/2020 10:42   CARDIAC CATHETERIZATION  Result Date: 06/15/2020  Previously placed Mid LAD stent (unknown type) is widely patent.  Prox LAD lesion is 50% stenosed.  Dist Cx lesion is 85% stenosed. THis is unchanged from prior and quite distal in a small vessel. Medical treatment.  Prox Cx lesion is 10% stenosed.  Ost Cx lesion is 10% stenosed.   Mid RCA lesion is 75% stenosed. IVUS revealed that this is a 3.5 vessel distally and a 4.0 mm vessel more proximally.  Balloon angioplasty was performed (within the old 2.5 mm stent from 2014) using a 2.75 [place] balloon followed by a BALLOON SAPPHIRE [place] 3.5X15 to high pressure.  Post intervention, there is a 0% residual stenosis. Followup IVUS revealed significantly better stent expansion.  The left ventricular systolic function is normal.  LV end diastolic pressure is normal.  The left ventricular ejection fraction is 55-65% by visual estimate.  There is no aortic valve stenosis.  Decrease Imdur from 180 to 120 mg. Continue aggressive secondary prevention.  Could hold Plavix after 30 days since this was PTCA only.    Disposition   Pt is being discharged home today in good condition.  Follow-up Plans & Appointments     Follow-up Information     Verta Ellen., NP Follow up on 06/25/2020.   Specialty: Cardiology Why: at 10am for your follow up appt Contact information: [place] [id number] [phone]                Discharge Instructions     Amb Referral to Cardiac Rehabilitation   Complete by: As directed    Diagnosis: PTCA   After initial evaluation and assessments completed: Virtual Based Care may be provided alone or  in conjunction with Phase 2 Cardiac Rehab based on patient barriers.: Yes        Discharge Medications   Allergies as of 06/15/2020       Reactions   Morphine And Related Itching, Rash   redness   Lasix [furosemide]    Chest pain and tightness   Latex Rash        Medication List     TAKE these medications    albuterol 108 (90 Base) MCG/ACT inhaler Commonly known as: VENTOLIN HFA Inhale 2 puffs into the lungs every 4 (four) hours as needed for wheezing.   ALPRAZolam 0.5 MG tablet Commonly known as: XANAX 1/2-1 twice daily as needed home use only use sparingly What changed:   how much to  take  how to take this  when to take this  reasons to take this   amLODipine 10 MG tablet Commonly known as: NORVASC Take 1 tablet (10 mg total) by mouth daily.   aspirin 81 MG chewable tablet Chew 81 mg by mouth daily.   clopidogrel 75 MG tablet Commonly known as: PLAVIX THE DAY AFTER STOPPING BRILINTA, TAKE 4 TABLETS BY MOUTH ONCE, THEN BEGIN TAKING ONE TABLET ONCE DAILY THEREAFTER What changed:   how much to take  how to take this  when to take this  additional instructions   hydrALAZINE 50 MG tablet Commonly known as: APRESOLINE Take 1 tablet (50 mg total) by mouth in the morning and at bedtime.   HYDROmorphone 4 MG tablet Commonly known as: DILAUDID Take 1 tablet (4 mg total) by mouth every 6 (six) hours as needed for severe pain.   isosorbide mononitrate 120 MG 24 hr tablet Commonly known as: IMDUR TAKE 1 AND 1/2 TABLETS DAILY What changed:   how much to take  how to take this  when to take this   losartan 100 MG tablet Commonly known as: COZAAR TAKE 1 TABLET(100 MG) BY MOUTH DAILY What changed:   how much to take  how to take this  when to take this   nadolol 80 MG tablet Commonly known as: CORGARD TAKE 1 TABLET(80 MG) BY MOUTH DAILY What changed: See the new instructions.   nitroGLYCERIN 0.4 MG SL tablet Commonly known as: NITROSTAT Place 1 tablet (0.4 mg total) under the tongue every 5 (five) minutes as needed for chest pain (MAX 3 TABLETS).   promethazine 25 MG tablet Commonly known as: PHENERGAN TAKE 1 TABLET BY MOUTH TWICE DAILY AS NEEDED FOR NAUSEA What changed:   how much to take  how to take this  when to take this  reasons to take this   rosuvastatin 20 MG tablet Commonly known as: CRESTOR TAKE 1 TABLET(20 MG) BY MOUTH DAILY What changed:   how much to take  how to take this  when to take this   tamsulosin 0.4 MG Caps capsule Commonly known as: FLOMAX TAKE 1 CAPSULE BY MOUTH DAILY            Allergies Allergies  Allergen Reactions  . Morphine And Related Itching and Rash    redness  . Lasix [Furosemide]     Chest pain and tightness  . Latex Rash    Outstanding Labs/Studies   FLP/LFTs in 8 weeks  Duration of Discharge Encounter   Greater than 30 minutes including physician time.  Signed, Reino Bellis, NP 06/15/2020, 3:18 PM  I have examined the patient and reviewed assessment and plan and discussed with patient.  Agree with above as  stated.  Patient had PTCA of the prior RCA stent.  Postdilated to 3.5 mm after cutting balloon angioplasty.  CLosure device to right groin.  No hematoma.  Did have a brief vagal episode post procedure but recovered well from this after just a few minutes.  Continue clopidogrel along with aggressive secondary prevention.  Cardiac rehab will be beneficial along with healthy diet and avoiding tobacco.  Larae Grooms

## 2020-06-15 NOTE — Progress Notes (Signed)
CARDIAC REHAB PHASE I   Stent education completed with pt. Pt educated on importance of ASA and Plavix. Pt given heart healthy diet. Reviewed site care, restrictions, and exercise guidelines. Will refer to CRP II [place].  [phone] Rufina Falco, RN BSN 06/15/2020 2:45 PM

## 2020-06-15 NOTE — Discharge Instructions (Signed)
Femoral Site Care  This sheet gives you information about how to care for yourself after your procedure. Your health care provider may also give you more specific instructions. If you have problems or questions, contact your health care provider. What can I expect after the procedure? After the procedure, it is common to have:  Bruising that usually fades within 1-2 weeks.  Tenderness at the site. Follow these instructions at home: Wound care  Follow instructions from your health care provider about how to take care of your insertion site. Make sure you: ? Wash your hands with soap and water before you change your bandage (dressing). If soap and water are not available, use hand sanitizer. ? Change your dressing as told by your health care provider. ? Leave stitches (sutures), skin glue, or adhesive strips in place. These skin closures may need to stay in place for 2 weeks or longer. If adhesive strip edges start to loosen and curl up, you may trim the loose edges. Do not remove adhesive strips completely unless your health care provider tells you to do that.  Do not take baths, swim, or use a hot tub until your health care provider approves.  You may shower 24-48 hours after the procedure or as told by your health care provider. ? Gently wash the site with plain soap and water. ? Pat the area dry with a clean towel. ? Do not rub the site. This may cause bleeding.  Do not apply powder or lotion to the site. Keep the site clean and dry.  Check your femoral site every day for signs of infection. Check for: ? Redness, swelling, or pain. ? Fluid or blood. ? Warmth. ? Pus or a bad smell. Activity  For the first 2-3 days after your procedure, or as long as directed: ? Avoid climbing stairs as much as possible. ? Do not squat.  Do not lift anything that is heavier than 10 lb (4.5 kg), or the limit that you are told, until your health care provider says that it is safe.  Rest as  directed. ? Avoid sitting for a long time without moving. Get up to take short walks every 1-2 hours.  Do not drive for 24 hours if you were given a medicine to help you relax (sedative). General instructions  Take over-the-counter and prescription medicines only as told by your health care provider.  Keep all follow-up visits as told by your health care provider. This is important. Contact a health care provider if you have:  A fever or chills.  You have redness, swelling, or pain around your insertion site. Get help right away if:  The catheter insertion area swells very fast.  You pass out.  You suddenly start to sweat or your skin gets clammy.  The catheter insertion area is bleeding, and the bleeding does not stop when you hold steady pressure on the area.  The area near or just beyond the catheter insertion site becomes pale, cool, tingly, or numb. These symptoms may represent a serious problem that is an emergency. Do not wait to see if the symptoms will go away. Get medical help right away. Call your local emergency services (911 in the [place] Do not drive yourself to the hospital. Summary  After the procedure, it is common to have bruising that usually fades within 1-2 weeks.  Check your femoral site every day for signs of infection.  Do not lift anything that is heavier than 10 lb (4.5 kg), or   the limit that you are told, until your health care provider says that it is safe. This information is not intended to replace advice given to you by your health care provider. Make sure you discuss any questions you have with your health care provider. Document Revised: 12/05/2019 Document Reviewed: 12/05/2019 Elsevier Patient Education  2021 [employer]  

## 2020-06-15 NOTE — Progress Notes (Signed)
BP: 61/27 Pt pale, lethargic, md paged, EKG performed, NS open, pt placed in trendelenbergs  1249: 75/50 1251: 95/58 1253:110/58 1258:  Ria Comment, PA at bedside no new orders 1308: Dr Beau Fanny at bedside no new orders Will continue to monitor

## 2020-06-15 NOTE — Telephone Encounter (Signed)
Nurses Please revise his lab orders. Please order A1c, lipid, liver, PSA, glucose Please have rest do these within the next 2 weeks preferably fasting   Hi [name]  I have ordered additional lab work to look closer at this issue. An A1c will help determine if we are dealing with diabetes. I hope your catheterization goes well. They typically send [place] copies via electronics.  Please do some lab work within 2 weeks time preferably fasting. If this does show diabetes we will bring you in for a discussion.  If any further issues please let me know  TakeCare-Dr. Nicki Reaper

## 2020-06-15 NOTE — Interval H&P Note (Signed)
Cath Lab Visit (complete for each Cath Lab visit)  Clinical Evaluation Leading to the Procedure:   ACS: No.  Non-ACS:    Anginal Classification: CCS III  Anti-ischemic medical therapy: Minimal Therapy (1 class of medications)  Non-Invasive Test Results: No non-invasive testing performed  Prior CABG: No previous CABG      History and Physical Interval Note:  06/15/2020 10:38 AM  [name]  has presented today for surgery, with the diagnosis of cp.  The various methods of treatment have been discussed with the patient and family. After consideration of risks, benefits and other options for treatment, the patient has consented to  Procedure(s): LEFT HEART CATH AND CORONARY ANGIOGRAPHY (N/A) as a surgical intervention.  The patient's history has been reviewed, patient examined, no change in status, stable for surgery.  I have reviewed the patient's chart and labs.  Questions were answered to the patient's satisfaction.     Larae Grooms

## 2020-06-16 ENCOUNTER — Encounter (HOSPITAL_COMMUNITY): Payer: Self-pay | Admitting: Interventional Cardiology

## 2020-06-23 ENCOUNTER — Other Ambulatory Visit: Payer: Self-pay | Admitting: Family Medicine

## 2020-06-23 DIAGNOSIS — E785 Hyperlipidemia, unspecified: Secondary | ICD-10-CM | POA: Diagnosis not present

## 2020-06-23 DIAGNOSIS — Z79899 Other long term (current) drug therapy: Secondary | ICD-10-CM | POA: Diagnosis not present

## 2020-06-23 DIAGNOSIS — Z125 Encounter for screening for malignant neoplasm of prostate: Secondary | ICD-10-CM | POA: Diagnosis not present

## 2020-06-23 DIAGNOSIS — R739 Hyperglycemia, unspecified: Secondary | ICD-10-CM | POA: Diagnosis not present

## 2020-06-24 ENCOUNTER — Encounter: Payer: Self-pay | Admitting: Cardiology

## 2020-06-24 ENCOUNTER — Ambulatory Visit (INDEPENDENT_AMBULATORY_CARE_PROVIDER_SITE_OTHER): Payer: Medicare Other | Admitting: Cardiology

## 2020-06-24 VITALS — BP 144/72 | HR 66 | Ht 72.0 in | Wt 226.0 lb

## 2020-06-24 DIAGNOSIS — I2510 Atherosclerotic heart disease of native coronary artery without angina pectoris: Secondary | ICD-10-CM | POA: Diagnosis not present

## 2020-06-24 DIAGNOSIS — R0602 Shortness of breath: Secondary | ICD-10-CM

## 2020-06-24 LAB — LIPID PANEL
Cholesterol: 119 mg/dL (ref <200)
HDL: 52 mg/dL (ref >=40)
LDL Cholesterol (Calc): 47 mg/dL (calc)
Non-HDL Cholesterol (Calc): 67 mg/dL (calc) (ref <130)
Total CHOL/HDL Ratio: 2.3 (calc) (ref <5.0)
Triglycerides: 113 mg/dL (ref <150)

## 2020-06-24 LAB — HEPATIC FUNCTION PANEL
AG Ratio: 2 (calc) (ref 1.0–2.5)
ALT: 18 U/L (ref 9–46)
AST: 19 U/L (ref 10–35)
Albumin: 4.3 g/dL (ref 3.6–5.1)
Alkaline phosphatase (APISO): 53 U/L (ref 35–144)
Bilirubin, Direct: 0.3 mg/dL — ABNORMAL HIGH (ref 0.0–0.2)
Globulin: 2.1 g/dL (calc) (ref 1.9–3.7)
Indirect Bilirubin: 1.1 mg/dL (calc) (ref 0.2–1.2)
Total Bilirubin: 1.4 mg/dL — ABNORMAL HIGH (ref 0.2–1.2)
Total Protein: 6.4 g/dL (ref 6.1–8.1)

## 2020-06-24 LAB — HEMOGLOBIN A1C W/OUT EAG: Hgb A1c MFr Bld: 6.9 % of total Hgb — ABNORMAL HIGH (ref <5.7)

## 2020-06-24 LAB — PSA: PSA: 1.86 ng/mL (ref <=4.0)

## 2020-06-24 LAB — GLUCOSE, FASTING: Glucose, Bld: 143 mg/dL — ABNORMAL HIGH (ref 65–139)

## 2020-06-24 MED ORDER — ISOSORBIDE MONONITRATE ER 120 MG PO TB24: 120.0000 mg | ORAL_TABLET | Freq: Every day | ORAL | 1 refills | Status: DC

## 2020-06-24 MED ORDER — HYDRALAZINE HCL 25 MG PO TABS: 25.0000 mg | ORAL_TABLET | Freq: Two times a day (BID) | ORAL | 1 refills | Status: DC

## 2020-06-24 NOTE — Patient Instructions (Signed)
Your physician recommends that you schedule a follow-up appointment in: [place] physician has recommended you make the following change in your medication:   CHANGE HYDRALAZINE 25 MG TWICE DAILY   CHANGE IMDUR 120 MG DAILY   Your physician recommends that you return for lab work CBC  Thank you for choosing [facility]!!

## 2020-06-24 NOTE — Progress Notes (Signed)
Clinical Summary [name] is a 65 y.o.male seen today for follow up of the following medical problems.    1. CAD - history of DES to LAD in 08/2018, PCI to RCA in 2014  06/2019 echo: LVEF 60-65%, no WMAs, grade I DDx,  - 09/2019 nuclear stress: small apical infarct with mild peri-infarct ischemia  - recent chest pains. Midchest pain that goes to back, left shoulder. Tightness like feeling. Often brought on with stress. Mild to moderate discomfort. Can have some SOB associated. Can last 24 hours constant. Better with xanax. Not positional. No relation to food or eating.  - has had some recent chest pains - epigastric radiates up chest into neck.  - can at times can come on with exertion. +SOB. Stops and pain resolves in 5 minutes. - can also occur at rest. Not positional. - can walk 1-2 miles at times without symptoms.  - essentially exertional symptoms with moderate or higher exertion - some component is constant.  - progressing DOE.    06/2020 cath: prox LAD 50%, patent LAD stent, LCX 85% distal, RCA 75% with stent ISR. Had PTCA of RCA ISR. From interventional could hold plavix after 30 days if needed  - feels fatigued, SOB, lightheaded - homes bp's running on lower side.  - occasional chest tightness at times.  - chest pains not improved since procedure - no new back pains. - some soreness right leg.     2. HTN - coimpliant with meds    3. Complete heart blocker - pacemaker followed by Dr Rayann Heman, CRT-P - Jan 2022 normal device check    4. Hyperlipidemia 01/2020 TC 145 TG 76 HDL 56 LDL 74      SH; just sold his house, was able to get a great price and only on market for a week. Living in a fifth wheel for the time being.   Past Medical History:  Diagnosis Date  . ADD (attention deficit disorder)    Rx-Adderall  . Anginal pain ([place])   . Aortic atherosclerosis ([place]) 05/27/2020   Seen on x-ray being treated with statin  . AV block, 3rd  degree ([facility])   . Colon polyps 2011   tubular adenoma by excisional biopsy during colonoscopy  . Complete heart block [facility])    a. s/p PPM Placement in 12/2012 with Medtronic CRT-P placement in 03/2014  . Coronary artery disease    a. s/p PCI of the RCA in 2014 b. 08/2018: patent RCA stent with new mid-LAD 85% stenosis (treated with PCI/DES) and 85% distal OM2 stenosis (med management recommended)  . Degenerative disc disease, cervical   . Gastrointestinal bleeding 10/15/2017  . GERD (gastroesophageal reflux disease)   . Hypertension   . Pacemaker   . Palpitations 2003   Holter in 2003: PACs and PVCs; negative stress nuclear test in 2005; right bundle [name] block; echo in 2008-mild LVH, otherwise normal; 2008-negative stress nuclear test.  . Pneumonia   . Prostatitis    prostate calcifications by CT  . Sleep apnea    questionable diagnosis     Allergies  Allergen Reactions  . Morphine And Related Itching and Rash    redness  . Lasix [Furosemide]     Chest pain and tightness  . Latex Rash     Current Outpatient Medications  Medication Sig Dispense Refill  . albuterol (VENTOLIN HFA) 108 (90 Base) MCG/ACT inhaler Inhale 2 puffs into the lungs every 4 (four) hours as needed for wheezing. [place]  g 2  . ALPRAZolam (XANAX) 0.5 MG tablet 1/2-1 twice daily as needed home use only use sparingly (Patient taking differently: Take 0.25-0.5 mg by mouth 2 (two) times daily as needed for anxiety. 1/2-1 twice daily as needed home use only use sparingly) 20 tablet 1  . amLODipine (NORVASC) 10 MG tablet Take 1 tablet (10 mg total) by mouth daily. 90 tablet 1  . aspirin 81 MG chewable tablet Chew 81 mg by mouth daily.    . clopidogrel (PLAVIX) 75 MG tablet THE DAY AFTER STOPPING [place], TAKE 4 TABLETS BY MOUTH ONCE, THEN BEGIN TAKING ONE TABLET ONCE DAILY THEREAFTER (Patient taking differently: Take 75 mg by mouth daily.) 90 tablet 3  . hydrALAZINE (APRESOLINE) 50 MG tablet Take 1 tablet (50 mg  total) by mouth in the morning and at bedtime. 180 tablet 3  . HYDROmorphone (DILAUDID) 4 MG tablet Take 1 tablet (4 mg total) by mouth every 6 (six) hours as needed for severe pain. 20 tablet 0  . isosorbide mononitrate (IMDUR) 120 MG 24 hr tablet TAKE 1 AND 1/2 TABLETS DAILY (Patient taking differently: Take 180 mg by mouth daily. TAKE 1 AND 1/2 TABLETS DAILY) 135 tablet 1  . losartan (COZAAR) 100 MG tablet TAKE 1 TABLET(100 MG) BY MOUTH DAILY (Patient taking differently: Take 100 mg by mouth daily. TAKE 1 TABLET(100 MG) BY MOUTH DAILY) 90 tablet 1  . nadolol (CORGARD) 80 MG tablet TAKE 1 TABLET(80 MG) BY MOUTH DAILY (Patient taking differently: Take 80 mg by mouth daily.) 90 tablet 1  . nitroGLYCERIN (NITROSTAT) 0.4 MG SL tablet Place 1 tablet (0.4 mg total) under the tongue every 5 (five) minutes as needed for chest pain (MAX 3 TABLETS). 25 tablet 3  . promethazine (PHENERGAN) 25 MG tablet TAKE 1 TABLET BY MOUTH TWICE DAILY AS NEEDED FOR NAUSEA (Patient taking differently: Take 25 mg by mouth 2 (two) times daily as needed for nausea or vomiting. TAKE 1 TABLET BY MOUTH TWICE DAILY AS NEEDED FOR NAUSEA) 30 tablet 5  . rosuvastatin (CRESTOR) 20 MG tablet TAKE 1 TABLET(20 MG) BY MOUTH DAILY (Patient taking differently: Take 20 mg by mouth daily. TAKE 1 TABLET(20 MG) BY MOUTH DAILY) 90 tablet 1  . tamsulosin (FLOMAX) 0.4 MG CAPS capsule TAKE 1 CAPSULE BY MOUTH DAILY 90 capsule 1   No current facility-administered medications for this visit.     Past Surgical History:  Procedure Laterality Date  . ANTERIOR CERVICAL DECOMP/DISCECTOMY FUSION    . BACK SURGERY    . Biventricular pacemaker upgrade/ system revision  04/02/14   removal of previous atrial and ventricular leads with placement of a new MDT Consulta CRT-P system at [facility] by Dr Violet Baldy  . COLONOSCOPY    . COLONOSCOPY W/ POLYPECTOMY  2011   tubular adenoma  . CORONARY BALLOON ANGIOPLASTY N/A 06/15/2020   Procedure:  CORONARY BALLOON ANGIOPLASTY;  Surgeon: Jettie Booze, MD;  Location: [place] CV LAB;  Service: Cardiovascular;  Laterality: N/A;  . CORONARY STENT INTERVENTION N/A 08/22/2018   Procedure: CORONARY STENT INTERVENTION;  Surgeon: Troy Sine, MD;  Location: [place] CV LAB;  Service: Cardiovascular;  Laterality: N/A;  . ESOPHAGOGASTRODUODENOSCOPY     Gastritis  . INSERT / REPLACE / REMOVE PACEMAKER  12/17/2012   MDT Adapta L implanted by Dr Rayann Heman for mobitz II second degree AV block  . INTRAVASCULAR ULTRASOUND/IVUS N/A 06/15/2020   Procedure: Intravascular Ultrasound/IVUS;  Surgeon: Jettie Booze, MD;  Location: [facility] INVASIVE CV  LAB;  Service: Cardiovascular;  Laterality: N/A;  . KNEE SURGERY Right   . LEFT HEART CATH AND CORONARY ANGIOGRAPHY N/A 08/22/2018   Procedure: LEFT HEART CATH AND CORONARY ANGIOGRAPHY;  Surgeon: Troy Sine, MD;  Location: [place] CV LAB;  Service: Cardiovascular;  Laterality: N/A;  . LEFT HEART CATH AND CORONARY ANGIOGRAPHY N/A 06/15/2020   Procedure: LEFT HEART CATH AND CORONARY ANGIOGRAPHY;  Surgeon: Jettie Booze, MD;  Location: [place] CV LAB;  Service: Cardiovascular;  Laterality: N/A;  . LEFT HEART CATHETERIZATION WITH CORONARY ANGIOGRAM N/A 10/17/2012   Procedure: LEFT HEART CATHETERIZATION WITH CORONARY ANGIOGRAM;  Surgeon: Josue Hector, MD;  Location: [facility] CATH LAB;  Service: Cardiovascular;  Laterality: N/A;  . LEFT HEART CATHETERIZATION WITH CORONARY ANGIOGRAM N/A 12/17/2012   Procedure: LEFT HEART CATHETERIZATION WITH CORONARY ANGIOGRAM;  Surgeon: Peter M Martinique, MD;  Location: [facility] CATH LAB;  Service: Cardiovascular;  Laterality: N/A;  . LEFT HEART CATHETERIZATION WITH CORONARY ANGIOGRAM N/A 06/11/2013   Procedure: LEFT HEART CATHETERIZATION WITH CORONARY ANGIOGRAM;  Surgeon: Wellington Hampshire, MD;  Location: [place] CATH LAB;  Service: Cardiovascular;  Laterality: N/A;  . PERCUTANEOUS CORONARY STENT INTERVENTION (PCI-S)  10/17/2012    Procedure: PERCUTANEOUS CORONARY STENT INTERVENTION (PCI-S);  Surgeon: Josue Hector, MD;  Location: [facility] CATH LAB;  Service: Cardiovascular;;  . PERMANENT PACEMAKER INSERTION N/A 12/17/2012   Procedure: PERMANENT PACEMAKER INSERTION;  Surgeon: Thompson Grayer, MD;  Location: [facility] CATH LAB;  Service: Cardiovascular;  Laterality: N/A;  . POLYPECTOMY    . UPPER GASTROINTESTINAL ENDOSCOPY       Allergies  Allergen Reactions  . Morphine And Related Itching and Rash    redness  . Lasix [Furosemide]     Chest pain and tightness  . Latex Rash      Family History  Problem Relation Age of Onset  . Heart disease Mother   . Hypertension Mother        Carotid disease  . Prostate cancer Brother   . Breast cancer Maternal Aunt   . Colon cancer Neg Hx   . Colon polyps Neg Hx      Social History Mr. Agerton reports that he quit smoking about 16 years ago. His smoking use included cigarettes. He started smoking about 53 years ago. He has a 100.00 pack-year smoking history. He has quit using smokeless tobacco. Mr. Rill reports current alcohol use of about 1.0 standard drink of alcohol per week.   Review of Systems CONSTITUTIONAL: per hpi HEENT: Eyes: No visual loss, blurred vision, double vision or yellow sclerae.No hearing loss, sneezing, congestion, runny nose or sore throat.  SKIN: No rash or itching.  CARDIOVASCULAR: per hpi RESPIRATORY: No shortness of breath, cough or sputum.  GASTROINTESTINAL: No anorexia, nausea, vomiting or diarrhea. No abdominal pain or blood.  GENITOURINARY: No burning on urination, no polyuria NEUROLOGICAL: No headache, dizziness, syncope, paralysis, ataxia, numbness or tingling in the extremities. No change in bowel or bladder control.  MUSCULOSKELETAL: No muscle, back pain, joint pain or stiffness.  LYMPHATICS: No enlarged nodes. No history of splenectomy.  PSYCHIATRIC: No history of depression or anxiety.  ENDOCRINOLOGIC: No reports of sweating, cold or heat  intolerance. No polyuria or polydipsia.  Marland Kitchen   Physical Examination Today's Vitals   06/24/20 1447  BP: (!) 144/72  Pulse: 66  SpO2: 98%  Weight: 226 lb (102.5 kg)  Height: 6' (1.829 m)   Body mass index is 30.65 kg/m.  Gen: resting comfortably, no acute distress HEENT: no  scleral icterus, pupils equal round and reactive, no palptable cervical adenopathy,  CV: RRR, no mr/g, no jvd Resp: Clear to auscultation bilaterally GI: abdomen is soft, non-tender, non-distended, normal bowel sounds, no hepatosplenomegaly MSK: extremities are warm, no edema.  Skin: warm, no rash Neuro:  no focal deficits Psych: appropriate affect   Diagnostic Studies  09/2019 nuclear stress  Findings consistent with prior small apical myocardial infarction with mild peri-infarct ischemia.  This is an intermediate risk study. Risk is based on decreased LVEF, consider correlating LVEF with echo.  The left ventricular ejection fraction is mildly decreased (43%).   06/2020 cath  Previously placed Mid LAD stent (unknown type) is widely patent.  Prox LAD lesion is 50% stenosed.  Dist Cx lesion is 85% stenosed. THis is unchanged from prior and quite distal in a small vessel. Medical treatment.  Prox Cx lesion is 10% stenosed.  Ost Cx lesion is 10% stenosed.  Mid RCA lesion is 75% stenosed. IVUS revealed that this is a 3.5 vessel distally and a 4.0 mm vessel more proximally.  Balloon angioplasty was performed (within the old 2.5 mm stent from 2014) using a 2.75 [place] balloon followed by a BALLOON SAPPHIRE [place] 3.5X15 to high pressure.  Post intervention, there is a 0% residual stenosis. Followup IVUS revealed significantly better stent expansion.  The left ventricular systolic function is normal.  LV end diastolic pressure is normal.  The left ventricular ejection fraction is 55-65% by visual estimate.  There is no aortic valve stenosis.   Decrease Imdur from 180 to 120 mg.   Continue  aggressive secondary prevention.    Could hold Plavix after 30 days since this was PTCA only.    Assessment and Plan  1. CAD - recent cath as reported above, PTCA of mid RCA - some ongoing fatigue, dizziness, SOB since procedure that may be related to recent medication changes - lower hydralazine to 25mg  bid, lower imdur to 120mg  daily and monitor symptoms - repeat labs to eval for any new anemia s/p cath    Arnoldo Lenis, M.D.

## 2020-06-25 ENCOUNTER — Ambulatory Visit: Payer: Medicare Other | Admitting: Family Medicine

## 2020-06-25 ENCOUNTER — Other Ambulatory Visit: Payer: Self-pay | Admitting: Cardiology

## 2020-06-25 DIAGNOSIS — R0602 Shortness of breath: Secondary | ICD-10-CM | POA: Diagnosis not present

## 2020-06-25 LAB — CBC WITH DIFFERENTIAL/PLATELET
Absolute Monocytes: 561 cells/uL (ref 200–950)
Basophils Absolute: 50 cells/uL (ref 0–200)
Basophils Relative: 0.8 %
Eosinophils Absolute: 258 cells/uL (ref 15–500)
Eosinophils Relative: 4.1 %
HCT: 39.9 % (ref 38.5–50.0)
Hemoglobin: 13.4 g/dL (ref 13.2–17.1)
Lymphs Abs: 1027 cells/uL (ref 850–3900)
MCH: 30 pg (ref 27.0–33.0)
MCHC: 33.6 g/dL (ref 32.0–36.0)
MCV: 89.5 fL (ref 80.0–100.0)
MPV: 10.7 fL (ref 7.5–12.5)
Monocytes Relative: 8.9 %
Neutro Abs: 4404 cells/uL (ref 1500–7800)
Neutrophils Relative %: 69.9 %
Platelets: 243 10*3/uL (ref 140–400)
RBC: 4.46 10*6/uL (ref 4.20–5.80)
RDW: 13.4 % (ref 11.0–15.0)
Total Lymphocyte: 16.3 %
WBC: 6.3 10*3/uL (ref 3.8–10.8)

## 2020-06-29 ENCOUNTER — Telehealth: Payer: Self-pay | Admitting: *Deleted

## 2020-06-29 NOTE — Telephone Encounter (Signed)
-----   Message from Arnoldo Lenis, MD sent at 06/28/2020  9:07 AM EDT ----- Mild decrease in hemoglobin that would be expected from the procedure, no severe drop that would suggest significant ongoing bleeding. How have his bp's been since we lowered his meds, has he felt any better?   Zandra Abts MD

## 2020-06-29 NOTE — Telephone Encounter (Signed)
Pt says since decrease of hydralazine has felt much better and has been able to walk around 2 miles at a time - doesn't remember last BP reading but says it has been normal

## 2020-07-01 ENCOUNTER — Ambulatory Visit: Payer: Medicare Other | Admitting: Student

## 2020-07-08 ENCOUNTER — Other Ambulatory Visit: Payer: Self-pay | Admitting: Family Medicine

## 2020-07-12 ENCOUNTER — Ambulatory Visit (INDEPENDENT_AMBULATORY_CARE_PROVIDER_SITE_OTHER): Payer: Medicare Other | Admitting: Family Medicine

## 2020-07-12 ENCOUNTER — Encounter: Payer: Self-pay | Admitting: Family Medicine

## 2020-07-12 ENCOUNTER — Other Ambulatory Visit: Payer: Self-pay

## 2020-07-12 VITALS — BP 134/78 | HR 76 | Temp 98.4°F | Ht 72.0 in | Wt 224.4 lb

## 2020-07-12 DIAGNOSIS — M25552 Pain in left hip: Secondary | ICD-10-CM

## 2020-07-12 DIAGNOSIS — M5441 Lumbago with sciatica, right side: Secondary | ICD-10-CM

## 2020-07-12 DIAGNOSIS — M5442 Lumbago with sciatica, left side: Secondary | ICD-10-CM

## 2020-07-12 DIAGNOSIS — R739 Hyperglycemia, unspecified: Secondary | ICD-10-CM

## 2020-07-12 DIAGNOSIS — Z23 Encounter for immunization: Secondary | ICD-10-CM | POA: Diagnosis not present

## 2020-07-12 DIAGNOSIS — I10 Essential (primary) hypertension: Secondary | ICD-10-CM | POA: Diagnosis not present

## 2020-07-12 DIAGNOSIS — M545 Low back pain, unspecified: Secondary | ICD-10-CM | POA: Diagnosis not present

## 2020-07-12 MED ORDER — LOSARTAN POTASSIUM 100 MG PO TABS: ORAL_TABLET | ORAL | 1 refills | Status: DC

## 2020-07-12 NOTE — Progress Notes (Signed)
Subjective:    Patient ID: [name], male    DOB: [birth year], 65 y.o.   MRN: [id number]  Hypertension This is a chronic problem. The current episode started more than 1 year ago. Pertinent negatives include no chest pain, headaches or shortness of breath. Risk factors for coronary artery disease include dyslipidemia and male gender. Treatments tried: norvasc, losartan, indur, corgard, apresoline. There are no compliance problems.    Discuss recent labs and needs refill Dilaudid Patient has chronic pain in his lumbar spine Causes bilateral sciatica issues Worse in the evening time Often makes it very difficult to do much activity. At times the pain is so severe that he has to take Dilaudid.  Patient did not tolerate hydrocodone or oxycodone and they did not do enough to help him with his discomfort Patient is responsible with his medicines.  Does not use them frequently.  Drug registry was checked  Patient has underlying heart issues followed by cardiology no setbacks recently did have angioplasty which has helped  Lumbar pain  Primary hypertension - Plan: Basic metabolic panel  Acute bilateral low back pain with bilateral sciatica  Left hip pain - Plan: DG Hip Unilat W OR W/O Pelvis 2-3 Views Left  Need for vaccination - Plan: Pneumococcal polysaccharide vaccine 23-valent greater than or equal to 2yo subcutaneous/IM  Hyperglycemia - Plan: Basic metabolic panel, Hemoglobin A1c   Results for orders placed or performed in visit on 06/25/20  CBC with Differential/Platelet  Result Value Ref Range   WBC 6.3 3.8 - 10.8 Thousand/uL   RBC 4.46 4.20 - 5.80 Million/uL   Hemoglobin 13.4 13.2 - 17.1 g/dL   HCT 39.9 38.5 - 50.0 %   MCV 89.5 80.0 - 100.0 fL   MCH 30.0 27.0 - 33.0 pg   MCHC 33.6 32.0 - 36.0 g/dL   RDW 13.4 11.0 - 15.0 %   Platelets 243 140 - 400 Thousand/uL   MPV 10.7 7.5 - 12.5 fL   Neutro Abs 4,404 1,500 - 7,800 cells/uL   Lymphs Abs 1,027 850 - 3,900 cells/uL    Absolute Monocytes 561 200 - 950 cells/uL   Eosinophils Absolute 258 15 - 500 cells/uL   Basophils Absolute 50 0 - 200 cells/uL   Neutrophils Relative % 69.9 %   Total Lymphocyte 16.3 %   Monocytes Relative 8.9 %   Eosinophils Relative 4.1 %   Basophils Relative 0.8 %   Patient does relate the anterior left hip pain could be radiating from his back but is also this could be related to developing hip arthritis.  Patient here for follow-up regarding cholesterol.  The patient does have hyperlipidemia.  Patient does try to maintain a reasonable diet.  Patient does take the medication on a regular basis.  Denies missing a dose.  The patient denies any obvious side effects.  Prior blood work results reviewed with the patient.  The patient is aware of his cholesterol goals and the need to keep it under good control to lessen the risk of disease.  New onset diabetes.  Concerning for the development of diabetes.  We did talk about this at length.  He feels it is related to recent the diet and lack of activity therefore he states he is now eating healthy keto diet and doing regular physical activity he would like to recheck lab work again in 3 months  Review of Systems  Constitutional: Negative for activity change, fatigue and fever.  HENT: Negative for congestion and rhinorrhea.  Respiratory: Negative for cough and shortness of breath.   Cardiovascular: Negative for chest pain and leg swelling.  Gastrointestinal: Negative for abdominal pain, diarrhea and nausea.  Genitourinary: Negative for dysuria and hematuria.  Neurological: Negative for weakness and headaches.  Psychiatric/Behavioral: Negative for agitation and behavioral problems.       Objective:   Physical Exam Vitals reviewed.  Cardiovascular:     Rate and Rhythm: Normal rate and regular rhythm.     Heart sounds: Normal heart sounds. No murmur heard.   Pulmonary:     Effort: Pulmonary effort is normal.     Breath sounds:  Normal breath sounds.  Lymphadenopathy:     Cervical: No cervical adenopathy.  Neurological:     Mental Status: He is alert.  Psychiatric:        Behavior: Behavior normal.           Assessment & Plan:  1. Lumbar pain Significant lumbar pain Dilaudid when necessary for pain caution drowsiness use infrequently   2. Primary hypertension Blood pressure in good control continue medication watch diet stay active - Basic metabolic panel  3. Acute bilateral low back pain with bilateral sciatica This could be causing his left hip pain but is also possible left hip pain could be developing arthritis.  Continue stretching exercises regular physical activity  4. Left hip pain Hip arthritis is suspected check x-ray - DG Hip Unilat W OR W/O Pelvis 2-3 Views Left  5. Need for vaccination Pneumococcal 23 because of his A1c - Pneumococcal polysaccharide vaccine 23-valent greater than or equal to 2yo subcutaneous/IM  6. Hyperglycemia Recheck A1c again in 3 months.  Encourage patient to maintain low starch low carbohydrate diet - Basic metabolic panel - Hemoglobin A1c

## 2020-07-12 NOTE — Patient Instructions (Addendum)
Diabetes Mellitus and Nutrition, Adult When you have diabetes, or diabetes mellitus, it is very important to have healthy eating habits because your blood sugar (glucose) levels are greatly affected by what you eat and drink. Eating healthy foods in the right amounts, at about the same times every day, can help you:  Control your blood glucose.  Lower your risk of heart disease.  Improve your blood pressure.  Reach or maintain a healthy weight. What can affect my meal plan? Every person with diabetes is different, and each person has different needs for a meal plan. Your health care provider may recommend that you work with a dietitian to make a meal plan that is best for you. Your meal plan may vary depending on factors such as:  The calories you need.  The medicines you take.  Your weight.  Your blood glucose, blood pressure, and cholesterol levels.  Your activity level.  Other health conditions you have, such as heart or kidney disease. How do carbohydrates affect me? Carbohydrates, also called carbs, affect your blood glucose level more than any other type of food. Eating carbs naturally raises the amount of glucose in your blood. Carb counting is a method for keeping track of how many carbs you eat. Counting carbs is important to keep your blood glucose at a healthy level, especially if you use insulin or take certain oral diabetes medicines. It is important to know how many carbs you can safely have in each meal. This is different for every person. Your dietitian can help you calculate how many carbs you should have at each meal and for each snack. How does alcohol affect me? Alcohol can cause a sudden decrease in blood glucose (hypoglycemia), especially if you use insulin or take certain oral diabetes medicines. Hypoglycemia can be a life-threatening condition. Symptoms of hypoglycemia, such as sleepiness, dizziness, and confusion, are similar to symptoms of having too much  alcohol.  Do not drink alcohol if: ? Your health care provider tells you not to drink. ? You are pregnant, may be pregnant, or are planning to become pregnant.  If you drink alcohol: ? Do not drink on an empty stomach. ? Limit how much you use to:  0-1 drink a day for women.  0-2 drinks a day for men. ? Be aware of how much alcohol is in your drink. In the [place] one drink equals one 12 oz bottle of beer (355 mL), one 5 oz glass of wine (148 mL), or one 1 oz glass of hard liquor (44 mL). ? Keep yourself hydrated with water, diet soda, or unsweetened iced tea.  Keep in mind that regular soda, juice, and other mixers may contain a lot of sugar and must be counted as carbs. What are tips for following this plan? Reading food labels  Start by checking the serving size on the "Nutrition Facts" label of packaged foods and drinks. The amount of calories, carbs, fats, and other nutrients listed on the label is based on one serving of the item. Many items contain more than one serving per package.  Check the total grams (g) of carbs in one serving. You can calculate the number of servings of carbs in one serving by dividing the total carbs by 15. For example, if a food has 30 g of total carbs per serving, it would be equal to 2 servings of carbs.  Check the number of grams (g) of saturated fats and trans fats in one serving. Choose foods that have   a low amount or none of these fats.  Check the number of milligrams (mg) of salt (sodium) in one serving. Most people should limit total sodium intake to less than 2,300 mg per day.  Always check the nutrition information of foods labeled as "low-fat" or "nonfat." These foods may be higher in added sugar or refined carbs and should be avoided.  Talk to your dietitian to identify your daily goals for nutrients listed on the label. Shopping  Avoid buying canned, pre-made, or processed foods. These foods tend to be high in fat, sodium, and added  sugar.  Shop around the outside edge of the grocery store. This is where you will most often find fresh fruits and vegetables, bulk grains, fresh meats, and fresh dairy. Cooking  Use low-heat cooking methods, such as baking, instead of high-heat cooking methods like deep frying.  Cook using healthy oils, such as olive, canola, or sunflower oil.  Avoid cooking with butter, cream, or high-fat meats. Meal planning  Eat meals and snacks regularly, preferably at the same times every day. Avoid going long periods of time without eating.  Eat foods that are high in fiber, such as fresh fruits, vegetables, beans, and whole grains. Talk with your dietitian about how many servings of carbs you can eat at each meal.  Eat 4-6 oz (112-168 g) of lean protein each day, such as lean meat, chicken, fish, eggs, or tofu. One ounce (oz) of lean protein is equal to: ? 1 oz (28 g) of meat, chicken, or fish. ? 1 egg. ?  cup (62 g) of tofu.  Eat some foods each day that contain healthy fats, such as avocado, nuts, seeds, and fish.   What foods should I eat? Fruits Berries. Apples. Oranges. Peaches. Apricots. Plums. Grapes. Mango. Papaya. Pomegranate. Kiwi. Cherries. Vegetables Lettuce. Spinach. Leafy greens, including kale, chard, collard greens, and mustard greens. Beets. Cauliflower. Cabbage. Broccoli. Carrots. Green beans. Tomatoes. Peppers. Onions. Cucumbers. Brussels sprouts. Grains Whole grains, such as whole-wheat or whole-grain bread, crackers, tortillas, cereal, and pasta. Unsweetened oatmeal. Quinoa. Brown or wild rice. Meats and other proteins Seafood. Poultry without skin. Lean cuts of poultry and beef. Tofu. Nuts. Seeds. Dairy Low-fat or fat-free dairy products such as milk, yogurt, and cheese. The items listed above may not be a complete list of foods and beverages you can eat. Contact a dietitian for more information. What foods should I avoid? Fruits Fruits canned with  syrup. Vegetables Canned vegetables. Frozen vegetables with butter or cream sauce. Grains Refined white flour and flour products such as bread, pasta, snack foods, and cereals. Avoid all processed foods. Meats and other proteins Fatty cuts of meat. Poultry with skin. Breaded or fried meats. Processed meat. Avoid saturated fats. Dairy Full-fat yogurt, cheese, or milk. Beverages Sweetened drinks, such as soda or iced tea. The items listed above may not be a complete list of foods and beverages you should avoid. Contact a dietitian for more information. Questions to ask a health care provider  Do I need to meet with a diabetes educator?  Do I need to meet with a dietitian?  What number can I call if I have questions?  When are the best times to check my blood glucose? Where to find more information:  American Diabetes Association: diabetes.org  Academy of Nutrition and Dietetics: www.eatright.org  National Institute of Diabetes and Digestive and Kidney Diseases: www.niddk.nih.gov  Association of Diabetes Care and Education Specialists: www.diabeteseducator.org Summary  It is important to have healthy eating   habits because your blood sugar (glucose) levels are greatly affected by what you eat and drink.  A healthy meal plan will help you control your blood glucose and maintain a healthy lifestyle.  Your health care provider may recommend that you work with a dietitian to make a meal plan that is best for you.  Keep in mind that carbohydrates (carbs) and alcohol have immediate effects on your blood glucose levels. It is important to count carbs and to use alcohol carefully. This information is not intended to replace advice given to you by your health care provider. Make sure you discuss any questions you have with your health care provider. Document Revised: 03/11/2019 Document Reviewed: 03/11/2019 Elsevier Patient Education  2021 [place].   Shingrix and shingles  prevention: know the facts!   Shingrix is a very effective vaccine to prevent shingles.   Shingles is a reactivation of chickenpox -more than 99% of Americans born before 1980 have had chickenpox even if they do not remember it. One in every 10 people who get shingles have severe long-lasting nerve pain as a result.   33 out of a 100 older adults will get shingles if they are unvaccinated.     This vaccine is very important for your health This vaccine is indicated for anyone 50 years or older. You can get this vaccine even if you have already had shingles because you can get the disease more than once in a lifetime.  Your risk for shingles and its complications increases with age.  This vaccine has 2 doses.  The second dose would be 2 to 6 months after the first dose.  If you had Zostavax vaccine in the past you should still get Shingrix. ( Zostavax is only 70% effective and it loses significant strength over a few years .)  This vaccine is given through the pharmacy.  The cost of the vaccine is through your insurance. The pharmacy can inform you of the total costs.  Common side effects including soreness in the arm, some redness and swelling, also some feel fatigue muscle soreness headache low-grade fever.  Side effects typically go away within 2 to 3 days. Remember-the pain from shingles can last a lifetime but these side effects of the vaccine will only last a few days at most. It is very important to get both doses in order to protect yourself fully.   Please get this vaccine at your earliest convenience at your trusted pharmacy.

## 2020-07-15 ENCOUNTER — Ambulatory Visit: Payer: Medicare Other | Admitting: Family Medicine

## 2020-07-16 ENCOUNTER — Encounter: Payer: Self-pay | Admitting: Family Medicine

## 2020-07-19 ENCOUNTER — Other Ambulatory Visit: Payer: Self-pay | Admitting: Family Medicine

## 2020-07-19 MED ORDER — HYDROMORPHONE HCL 4 MG PO TABS: 4.0000 mg | ORAL_TABLET | Freq: Four times a day (QID) | ORAL | 0 refills | Status: DC | PRN

## 2020-07-21 NOTE — Progress Notes (Signed)
Cardiology Office Note  Date: 07/22/2020   ID: ZAIDE KARDELL, DOB 23-Aug-1955, MRN [id number]  PCP:  Kathyrn Drown, MD  Cardiologist:  Carlyle Dolly, MD Electrophysiologist:  Thompson Grayer, MD   Chief Complaint: Follow up 1 month.   History of Present Illness: [name] is a 65 y.o. male with a history of CAD, HTN, complete heart block, HLD.  Had recently undergone cardiac catheterization earlier in the month 06/15/2020 with evidence of proximal LAD 50%, patent LAD stent, distal RCA 75% with ISR.  Had balloon angioplasty of of RCA ISR.  0% residual stenosis.  Per interventional cardiology could hold Plavix after 3 days if needed.    Last seen by Dr. Harl Bowie 06/24/2020 with feelings of fatigue, shortness of breath, lightheadedness.  Home blood pressures were running on the lower side.  Occasional chest tightness at times.  Chest pains had not improved since procedure.  Had some soreness of the right leg.  Blood pressure 104/58 since catheterization.  Seeing Dr. Rayann Heman for his pacemaker.  Had a normal device check on January 2022.  LDL October 2021 was 74.  He was continuing Crestor 20 mg daily. Hydralazine was decreased to 25 mg po bid. Imdur decreased to 120 mg po daily.  To continue DAPT therapy for now.   He is here for 1 month follow-up.  He denies any anginal or exertional symptoms, orthostatic symptoms such as lightheadedness, dizziness, presyncope or syncopal episodes since decreasing her hydralazine dosage and Imdur dosage.  Blood pressure today is 124/72.  Heart rate of 65.  States he is noticing some increasing tiredness but he has been trying to walk.  He states he has a new diagnosis of type 2 diabetes with recent hemoglobin A1c of 6.9%.  States for now he is trying to manage with keto diet.  He plans to try this for 3 months and if diet does not control blood sugar levels he may opt for medical therapy.  He is mentioning he would like to start following up with his  cardiologist at the [facility] who he has been seeing for quite some time now.  He denies any bleeding on DAPT therapy.  Past Medical History:  Diagnosis Date  . ADD (attention deficit disorder)    Rx-Adderall  . Anginal pain ([place])   . Aortic atherosclerosis ([place]) 05/27/2020   Seen on x-ray being treated with statin  . AV block, 3rd degree (HCC)   . Colon polyps 2011   tubular adenoma by excisional biopsy during colonoscopy  . Complete heart block [facility])    a. s/p PPM Placement in 12/2012 with Medtronic CRT-P placement in 03/2014  . Coronary artery disease    a. s/p PCI of the RCA in 2014 b. 08/2018: patent RCA stent with new mid-LAD 85% stenosis (treated with PCI/DES) and 85% distal OM2 stenosis (med management recommended)  . Degenerative disc disease, cervical   . Gastrointestinal bleeding 10/15/2017  . GERD (gastroesophageal reflux disease)   . Hypertension   . Pacemaker   . Palpitations 2003   Holter in 2003: PACs and PVCs; negative stress nuclear test in 2005; right bundle branch block; echo in 2008-mild LVH, otherwise normal; 2008-negative stress nuclear test.  . Pneumonia   . Prostatitis    prostate calcifications by CT  . Sleep apnea    questionable diagnosis    Past Surgical History:  Procedure Laterality Date  . ANTERIOR CERVICAL DECOMP/DISCECTOMY FUSION    . BACK SURGERY    .  Biventricular pacemaker upgrade/ system revision  04/02/14   removal of previous atrial and ventricular leads with placement of a new MDT Consulta CRT-P system at [facility] by Dr Violet Baldy  . COLONOSCOPY    . COLONOSCOPY W/ POLYPECTOMY  2011   tubular adenoma  . CORONARY BALLOON ANGIOPLASTY N/A 06/15/2020   Procedure: CORONARY BALLOON ANGIOPLASTY;  Surgeon: Jettie Booze, MD;  Location: [place] CV LAB;  Service: Cardiovascular;  Laterality: N/A;  . CORONARY STENT INTERVENTION N/A 08/22/2018   Procedure: CORONARY STENT INTERVENTION;  Surgeon: Troy Sine, MD;  Location: [place] CV LAB;  Service: Cardiovascular;  Laterality: N/A;  . ESOPHAGOGASTRODUODENOSCOPY     Gastritis  . INSERT / REPLACE / REMOVE PACEMAKER  12/17/2012   MDT Adapta L implanted by Dr Rayann Heman for mobitz II second degree AV block  . INTRAVASCULAR ULTRASOUND/IVUS N/A 06/15/2020   Procedure: Intravascular Ultrasound/IVUS;  Surgeon: Jettie Booze, MD;  Location: [place] CV LAB;  Service: Cardiovascular;  Laterality: N/A;  . KNEE SURGERY Right   . LEFT HEART CATH AND CORONARY ANGIOGRAPHY N/A 08/22/2018   Procedure: LEFT HEART CATH AND CORONARY ANGIOGRAPHY;  Surgeon: Troy Sine, MD;  Location: [place] CV LAB;  Service: Cardiovascular;  Laterality: N/A;  . LEFT HEART CATH AND CORONARY ANGIOGRAPHY N/A 06/15/2020   Procedure: LEFT HEART CATH AND CORONARY ANGIOGRAPHY;  Surgeon: Jettie Booze, MD;  Location: [place] CV LAB;  Service: Cardiovascular;  Laterality: N/A;  . LEFT HEART CATHETERIZATION WITH CORONARY ANGIOGRAM N/A 10/17/2012   Procedure: LEFT HEART CATHETERIZATION WITH CORONARY ANGIOGRAM;  Surgeon: Josue Hector, MD;  Location: [facility] CATH LAB;  Service: Cardiovascular;  Laterality: N/A;  . LEFT HEART CATHETERIZATION WITH CORONARY ANGIOGRAM N/A 12/17/2012   Procedure: LEFT HEART CATHETERIZATION WITH CORONARY ANGIOGRAM;  Surgeon: Peter M Martinique, MD;  Location: [facility] CATH LAB;  Service: Cardiovascular;  Laterality: N/A;  . LEFT HEART CATHETERIZATION WITH CORONARY ANGIOGRAM N/A 06/11/2013   Procedure: LEFT HEART CATHETERIZATION WITH CORONARY ANGIOGRAM;  Surgeon: Wellington Hampshire, MD;  Location: [place] CATH LAB;  Service: Cardiovascular;  Laterality: N/A;  . PERCUTANEOUS CORONARY STENT INTERVENTION (PCI-S)  10/17/2012   Procedure: PERCUTANEOUS CORONARY STENT INTERVENTION (PCI-S);  Surgeon: Josue Hector, MD;  Location: [facility] CATH LAB;  Service: Cardiovascular;;  . PERMANENT PACEMAKER INSERTION N/A 12/17/2012   Procedure: PERMANENT PACEMAKER INSERTION;  Surgeon: Thompson Grayer, MD;  Location: [facility]  CATH LAB;  Service: Cardiovascular;  Laterality: N/A;  . POLYPECTOMY    . UPPER GASTROINTESTINAL ENDOSCOPY      Current Outpatient Medications  Medication Sig Dispense Refill  . albuterol (VENTOLIN HFA) 108 (90 Base) MCG/ACT inhaler Inhale 2 puffs into the lungs every 4 (four) hours as needed for wheezing. 18 g 2  . ALPRAZolam (XANAX) 0.5 MG tablet 1/2-1 twice daily as needed home use only use sparingly (Patient taking differently: Take 0.25-0.5 mg by mouth 2 (two) times daily as needed for anxiety. 1/2-1 twice daily as needed home use only use sparingly) 20 tablet 1  . amLODipine (NORVASC) 10 MG tablet Take 1 tablet (10 mg total) by mouth daily. 90 tablet 1  . aspirin 81 MG chewable tablet Chew 81 mg by mouth daily.    . clopidogrel (PLAVIX) 75 MG tablet THE DAY AFTER STOPPING BRILINTA, TAKE 4 TABLETS BY MOUTH ONCE, THEN BEGIN TAKING ONE TABLET ONCE DAILY THEREAFTER (Patient taking differently: Take 75 mg by mouth daily.) 90 tablet 3  . hydrALAZINE (APRESOLINE) 25 MG tablet Take 1  tablet (25 mg total) by mouth in the morning and at bedtime. 180 tablet 1  . HYDROmorphone (DILAUDID) 4 MG tablet Take 1 tablet (4 mg total) by mouth every 6 (six) hours as needed for severe pain. 20 tablet 0  . isosorbide mononitrate (IMDUR) 120 MG 24 hr tablet Take 1 tablet (120 mg total) by mouth daily. 90 tablet 1  . losartan (COZAAR) 100 MG tablet 1 qd 90 tablet 1  . nadolol (CORGARD) 80 MG tablet TAKE 1 TABLET(80 MG) BY MOUTH DAILY (Patient taking differently: Take 80 mg by mouth daily.) 90 tablet 1  . nitroGLYCERIN (NITROSTAT) 0.4 MG SL tablet Place 1 tablet (0.4 mg total) under the tongue every 5 (five) minutes as needed for chest pain (MAX 3 TABLETS). 25 tablet 3  . promethazine (PHENERGAN) 25 MG tablet TAKE 1 TABLET BY MOUTH TWICE DAILY AS NEEDED FOR NAUSEA (Patient taking differently: Take 25 mg by mouth 2 (two) times daily as needed for nausea or vomiting. TAKE 1 TABLET BY MOUTH TWICE DAILY AS NEEDED FOR  NAUSEA) 30 tablet 5  . rosuvastatin (CRESTOR) 20 MG tablet TAKE 1 TABLET(20 MG) BY MOUTH DAILY (Patient taking differently: Take 20 mg by mouth daily. TAKE 1 TABLET(20 MG) BY MOUTH DAILY) 90 tablet 1  . tamsulosin (FLOMAX) 0.4 MG CAPS capsule TAKE 1 CAPSULE BY MOUTH DAILY 90 capsule 1   No current facility-administered medications for this visit.   Allergies:  Morphine and related, Lasix [furosemide], and Latex   Social History: The patient  reports that he quit smoking about 16 years ago. His smoking use included cigarettes. He started smoking about 53 years ago. He has a 100.00 pack-year smoking history. He has quit using smokeless tobacco. He reports current alcohol use of about 1.0 standard drink of alcohol per week. He reports previous drug use. Drug: Marijuana.   Family History: The patient's family history includes Breast cancer in his maternal aunt; Heart disease in his mother; Hypertension in his mother; Prostate cancer in his brother.   ROS:  Please see the history of present illness. Otherwise, complete review of systems is positive for none.  All other systems are reviewed and negative.   Physical Exam: VS:  BP 124/72   Pulse 65   Ht 6' (1.829 m)   Wt 226 lb 9.6 oz (102.8 kg)   SpO2 94%   BMI 30.73 kg/m , BMI Body mass index is 30.73 kg/m.  Wt Readings from Last 3 Encounters:  07/22/20 226 lb 9.6 oz (102.8 kg)  07/12/20 224 lb 6.4 oz (101.8 kg)  06/24/20 226 lb (102.5 kg)    General: Patient appears comfortable at rest. Neck: Supple, no elevated JVP or carotid bruits, no thyromegaly. Lungs: Clear to auscultation, nonlabored breathing at rest. Cardiac: Regular rate and rhythm, no S3 or significant systolic murmur, no pericardial rub. Extremities: No pitting edema, distal pulses 2+. Skin: Warm and dry. Musculoskeletal: No kyphosis. Neuropsychiatric: Alert and oriented x3, affect grossly appropriate.  ECG:  EKG on 06/15/2020 demonstrated AV dual paced rhythm with  occasional premature ventricular complexes.  Heart rate of 67.  Recent Labwork: 08/29/2019: B Natriuretic Peptide 58.0 06/11/2020: BUN 16; Creat 1.00; Potassium 4.7; Sodium 144 06/23/2020: ALT 18; AST 19 06/25/2020: Hemoglobin 13.4; Platelets 243     Component Value Date/Time   CHOL 119 06/23/2020 1010   CHOL 145 01/30/2020 1010   TRIG 113 06/23/2020 1010   HDL 52 06/23/2020 1010   HDL 56 01/30/2020 1010   CHOLHDL 2.3  06/23/2020 1010   VLDL 9 12/17/2012 0215   LDLCALC 47 06/23/2020 1010    Other Studies Reviewed Today:   06/15/2020 PTCA  CORONARY BALLOON ANGIOPLASTY  Intravascular Ultrasound/IVUS  LEFT HEART CATH AND CORONARY ANGIOGRAPHY    Conclusion    Previously placed Mid LAD stent (unknown type) is widely patent.  Prox LAD lesion is 50% stenosed.  Dist Cx lesion is 85% stenosed. THis is unchanged from prior and quite distal in a small vessel. Medical treatment.  Prox Cx lesion is 10% stenosed.  Ost Cx lesion is 10% stenosed.  Mid RCA lesion is 75% stenosed. IVUS revealed that this is a 3.5 vessel distally and a 4.0 mm vessel more proximally.  Balloon angioplasty was performed (within the old 2.5 mm stent from 2014) using a 2.75 Wolverine balloon followed by a BALLOON SAPPHIRE [place] 3.5X15 to high pressure.  Post intervention, there is a 0% residual stenosis. Followup IVUS revealed significantly better stent expansion.  The left ventricular systolic function is normal.  LV end diastolic pressure is normal.  The left ventricular ejection fraction is 55-65% by visual estimate.  There is no aortic valve stenosis.   Decrease Imdur from 180 to 120 mg.   Continue aggressive secondary prevention.    Could hold Plavix after 30 days since this was PTCA only.   Diagnostic Dominance: Right    Intervention     Echocardiogram 06/20/2019 1. Left ventricular ejection fraction, by estimation, is 60 to 65%. The left ventricle has normal function. The left  ventricle has no regional wall motion abnormalities. There is mild left ventricular hypertrophy. Left ventricular diastolic parameters are consistent with Grade I diastolic dysfunction (impaired relaxation). 2. Right ventricular systolic function is normal. The right ventricular size is normal. 3. Left atrial size was mildly dilated. 4. The mitral valve is normal in structure and function. No evidence of mitral valve regurgitation. No evidence of mitral stenosis. 5. The aortic valve was not well visualized. Aortic valve regurgitation is not visualized. No aortic stenosis is present. 6. The inferior vena cava is dilated in size with >50% respiratory variability, suggesting right atrial pressure of 8 mmHg.   09/2019 nuclear stress  Findings consistent with prior small apical myocardial infarction with mild peri-infarct ischemia.  This is an intermediate risk study. Risk is based on decreased LVEF, consider correlating LVEF with echo.  The left ventricular ejection fraction is mildly decreased (43%).  Assessment and Plan:  1. CAD in native artery   2. Chest pain, exertional   3. Essential hypertension   4. Mixed hyperlipidemia    1. CAD in native artery Recent balloon angioplasty of RCA with in-stent restenosis on 06/15/2020.  He denies any anginal symptoms status post angioplasty.  Continue aspirin 81 mg daily.  Continue Plavix 75 mg daily.  Continue sublingual nitroglycerin as needed.  Continue Imdur 120 mg p.o. daily.  2. Chest pain, exertional Denies any recurrent exertional chest pain.  Continue medication as listed above in #1.  3. Essential hypertension Blood pressure well controlled today on current medical therapy.  Continue hydralazine 25 mg p.o. twice daily.  Continue losartan 100 mg p.o. daily.  Continue nadolol 80 mg daily.  Continue amlodipine 10 mg daily.  4. Mixed hyperlipidemia Continue Crestor 20 mg p.o. daily.  Recent lipid panel on 06/23/2020: Total cholesterol 119,  HDL 52, triglycerides 113, LDL 47  6.  New onset type 2 diabetes Patient states he recently discovered he has new onset diabetes with recent hemoglobin A1c of  6.9%.  He states for now he is practicing the keto diet to hopefully control his blood sugar levels.  He states he plans to try this for 3 months and if improved will continue.  Otherwise he may explore medical therapy in the future.  Medication Adjustments/Labs and Tests Ordered: Current medicines are reviewed at length with the patient today.  Concerns regarding medicines are outlined above.   Disposition: Follow-up with Dr. Harl Bowie or APP in 6 months.  This is a tentative appointment.  Patient states he wishes to start following his cardiologist at the [place].  Signed, Levell July, NP 07/22/2020 1:51 PM    [facility] at [place], [place], [place] [address] Phone: [phone]; Fax: [phone]

## 2020-07-22 ENCOUNTER — Other Ambulatory Visit: Payer: Self-pay

## 2020-07-22 ENCOUNTER — Ambulatory Visit (INDEPENDENT_AMBULATORY_CARE_PROVIDER_SITE_OTHER): Payer: Medicare Other

## 2020-07-22 ENCOUNTER — Encounter: Payer: Self-pay | Admitting: Family Medicine

## 2020-07-22 ENCOUNTER — Ambulatory Visit: Payer: Medicare Other | Admitting: Family Medicine

## 2020-07-22 VITALS — BP 124/72 | HR 65 | Ht 72.0 in | Wt 226.6 lb

## 2020-07-22 DIAGNOSIS — I10 Essential (primary) hypertension: Secondary | ICD-10-CM

## 2020-07-22 DIAGNOSIS — I441 Atrioventricular block, second degree: Secondary | ICD-10-CM

## 2020-07-22 DIAGNOSIS — R079 Chest pain, unspecified: Secondary | ICD-10-CM

## 2020-07-22 DIAGNOSIS — E782 Mixed hyperlipidemia: Secondary | ICD-10-CM | POA: Diagnosis not present

## 2020-07-22 DIAGNOSIS — I2510 Atherosclerotic heart disease of native coronary artery without angina pectoris: Secondary | ICD-10-CM | POA: Diagnosis not present

## 2020-07-22 LAB — CUP PACEART REMOTE DEVICE CHECK
Battery Remaining Longevity: 37 mo
Battery Voltage: 2.96 V
Brady Statistic AP VP Percent: 14.64 %
Brady Statistic AP VS Percent: 0 %
Brady Statistic AS VP Percent: 85.36 %
Brady Statistic AS VS Percent: 0 %
Brady Statistic RA Percent Paced: 14.63 %
Brady Statistic RV Percent Paced: 99.99 %
Date Time Interrogation Session: 20220407100909
Implantable Lead Implant Date: 20151217
Implantable Lead Implant Date: 20151217
Implantable Lead Implant Date: 20151217
Implantable Lead Location: 753858
Implantable Lead Location: 753859
Implantable Lead Location: 753860
Implantable Lead Model: 4196
Implantable Lead Model: 5076
Implantable Lead Model: 5076
Implantable Pulse Generator Implant Date: 20151217
Lead Channel Impedance Value: 342 Ohm
Lead Channel Impedance Value: 380 Ohm
Lead Channel Impedance Value: 399 Ohm
Lead Channel Impedance Value: 456 Ohm
Lead Channel Impedance Value: 532 Ohm
Lead Channel Impedance Value: 589 Ohm
Lead Channel Impedance Value: 665 Ohm
Lead Channel Impedance Value: 684 Ohm
Lead Channel Impedance Value: 988 Ohm
Lead Channel Pacing Threshold Amplitude: 0.75 V
Lead Channel Pacing Threshold Amplitude: 0.75 V
Lead Channel Pacing Threshold Amplitude: 1 V
Lead Channel Pacing Threshold Pulse Width: 0.4 ms
Lead Channel Pacing Threshold Pulse Width: 0.4 ms
Lead Channel Pacing Threshold Pulse Width: 0.4 ms
Lead Channel Sensing Intrinsic Amplitude: 1.375 mV
Lead Channel Sensing Intrinsic Amplitude: 1.375 mV
Lead Channel Sensing Intrinsic Amplitude: 22.5 mV
Lead Channel Sensing Intrinsic Amplitude: 22.5 mV
Lead Channel Setting Pacing Amplitude: 2 V
Lead Channel Setting Pacing Amplitude: 2.25 V
Lead Channel Setting Pacing Amplitude: 2.5 V
Lead Channel Setting Pacing Pulse Width: 0.4 ms
Lead Channel Setting Pacing Pulse Width: 0.4 ms
Lead Channel Setting Sensing Sensitivity: 0.9 mV

## 2020-07-22 NOTE — Patient Instructions (Addendum)
Medication Instructions:  Continue all current medications.   Labwork: none  Testing/Procedures: none  Follow-Up: 6 months   Any Other Special Instructions Will Be Listed Below (If Applicable).   If you need a refill on your cardiac medications before your next appointment, please call your pharmacy.  

## 2020-08-04 ENCOUNTER — Other Ambulatory Visit: Payer: Self-pay | Admitting: Family Medicine

## 2020-08-04 NOTE — Progress Notes (Signed)
Remote pacemaker transmission.   

## 2020-08-12 ENCOUNTER — Other Ambulatory Visit: Payer: Self-pay | Admitting: Cardiology

## 2020-08-12 ENCOUNTER — Other Ambulatory Visit: Payer: Self-pay

## 2020-08-12 ENCOUNTER — Ambulatory Visit (HOSPITAL_COMMUNITY)
Admission: RE | Admit: 2020-08-12 | Discharge: 2020-08-12 | Disposition: A | Discharge status: Home / Self Care | Payer: Medicare Other | Source: Ambulatory Visit | Attending: Family Medicine | Admitting: Family Medicine

## 2020-08-12 DIAGNOSIS — M25552 Pain in left hip: Secondary | ICD-10-CM | POA: Insufficient documentation

## 2020-08-12 DIAGNOSIS — M25551 Pain in right hip: Secondary | ICD-10-CM | POA: Diagnosis not present

## 2020-08-12 DIAGNOSIS — M47816 Spondylosis without myelopathy or radiculopathy, lumbar region: Secondary | ICD-10-CM | POA: Diagnosis not present

## 2020-08-19 ENCOUNTER — Telehealth: Payer: Self-pay

## 2020-08-19 MED ORDER — CLOPIDOGREL BISULFATE 75 MG PO TABS: 75.0000 mg | ORAL_TABLET | Freq: Every day | ORAL | 3 refills | Status: DC

## 2020-08-19 NOTE — Telephone Encounter (Signed)
Medication refill request approved for Plavix 75 mg tablets and sent to [facility]

## 2020-09-19 IMAGING — NM NM MYOCAR MULTI W/SPECT W/WALL MOTION & EF
2 series · 12 of 12 positions shown · non-contrast
Comparison: none

[Series 1: rest · 6.51mm/px · 6 of 64 frames shown]
[frame 6/64]
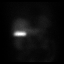
[frame 16/64]
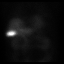
[frame 27/64]
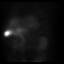
[frame 38/64]
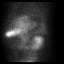
[frame 48/64]
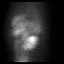
[frame 59/64]
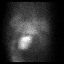

[Series 2: stress gated - perfusion · 6.51mm/px · 6 of 64 frames shown]
[frame 6/64]
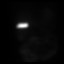
[frame 16/64]
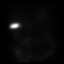
[frame 27/64]
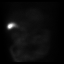
[frame 38/64]
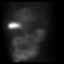
[frame 48/64]
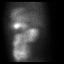
[frame 59/64]
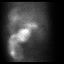

[12 of 12 positions shown; findings below may reference images not displayed]

Canned report from images found in remote index.

Refer to host system for actual result text.

## 2020-09-22 ENCOUNTER — Telehealth: Payer: Medicare Other

## 2020-09-22 NOTE — Telephone Encounter (Signed)
Physical scheduled for July 15th, wants to have labwork done prior.  Also wants A1c checked.

## 2020-09-22 NOTE — Telephone Encounter (Signed)
Pt has active labs from 07/12/20 for A1C and BMET. Please advise. Thank you

## 2020-09-26 NOTE — Telephone Encounter (Signed)
Has A1c and met 7 ordered in March these are still active please instruct him how to go about getting these thank you

## 2020-09-27 NOTE — Telephone Encounter (Signed)
Patient notified and verbalized understanding. 

## 2020-10-01 DIAGNOSIS — I10 Essential (primary) hypertension: Secondary | ICD-10-CM | POA: Diagnosis not present

## 2020-10-01 DIAGNOSIS — R739 Hyperglycemia, unspecified: Secondary | ICD-10-CM | POA: Diagnosis not present

## 2020-10-02 LAB — BASIC METABOLIC PANEL
BUN/Creatinine Ratio: 17 (ref 10–24)
BUN: 18 mg/dL (ref 8–27)
CO2: 23 mmol/L (ref 20–29)
Calcium: 9.5 mg/dL (ref 8.6–10.2)
Chloride: 103 mmol/L (ref 96–106)
Creatinine, Ser: 1.04 mg/dL (ref 0.76–1.27)
Glucose: 125 mg/dL — ABNORMAL HIGH (ref 65–99)
Potassium: 4.5 mmol/L (ref 3.5–5.2)
Sodium: 141 mmol/L (ref 134–144)
eGFR: 80 mL/min/{1.73_m2} (ref >59)

## 2020-10-02 LAB — HEMOGLOBIN A1C
Est. average glucose Bld gHb Est-mCnc: 134 mg/dL
Hgb A1c MFr Bld: 6.3 % — ABNORMAL HIGH (ref 4.8–5.6)

## 2020-10-07 ENCOUNTER — Encounter: Payer: Self-pay | Admitting: Family Medicine

## 2020-10-07 ENCOUNTER — Other Ambulatory Visit: Payer: Self-pay | Admitting: Family Medicine

## 2020-10-07 MED ORDER — ALPRAZOLAM 0.5 MG PO TABS: ORAL_TABLET | ORAL | 0 refills | Status: DC

## 2020-10-07 MED ORDER — HYDROMORPHONE HCL 4 MG PO TABS
4.0000 mg | ORAL_TABLET | Freq: Four times a day (QID) | ORAL | 0 refills | Status: DC | PRN
Start: 2020-10-07 — End: 2021-01-26

## 2020-10-27 ENCOUNTER — Ambulatory Visit (INDEPENDENT_AMBULATORY_CARE_PROVIDER_SITE_OTHER): Payer: Medicare Other | Admitting: Family Medicine

## 2020-10-27 ENCOUNTER — Other Ambulatory Visit: Payer: Self-pay

## 2020-10-27 VITALS — BP 124/74 | HR 66 | Temp 97.3°F | Ht 70.0 in | Wt 221.4 lb

## 2020-10-27 DIAGNOSIS — Z125 Encounter for screening for malignant neoplasm of prostate: Secondary | ICD-10-CM

## 2020-10-27 DIAGNOSIS — I10 Essential (primary) hypertension: Secondary | ICD-10-CM

## 2020-10-27 DIAGNOSIS — Z1211 Encounter for screening for malignant neoplasm of colon: Secondary | ICD-10-CM

## 2020-10-27 DIAGNOSIS — Z0001 Encounter for general adult medical examination with abnormal findings: Secondary | ICD-10-CM

## 2020-10-27 DIAGNOSIS — R0600 Dyspnea, unspecified: Secondary | ICD-10-CM

## 2020-10-27 DIAGNOSIS — E785 Hyperlipidemia, unspecified: Secondary | ICD-10-CM | POA: Diagnosis not present

## 2020-10-27 DIAGNOSIS — R7303 Prediabetes: Secondary | ICD-10-CM

## 2020-10-27 DIAGNOSIS — Z0000 Encounter for general adult medical examination without abnormal findings: Secondary | ICD-10-CM

## 2020-10-27 DIAGNOSIS — R0609 Other forms of dyspnea: Secondary | ICD-10-CM

## 2020-10-27 NOTE — Progress Notes (Signed)
   Subjective:    Patient ID: [name], male    DOB: [birth year], 65 y.o.   MRN: [id number]  HPI AWV- Annual Wellness Visit  The patient was seen for their annual wellness visit. The patient's past medical history, surgical history, and family history were reviewed. Pertinent vaccines were reviewed ( tetanus, pneumonia, shingles, flu) The patient's medication list was reviewed and updated.  The height and weight were entered.  BMI recorded in electronic record elsewhere  Cognitive screening was completed. Outcome of Mini - Cog: pass   Falls /depression screening electronically recorded within record elsewhere  Current tobacco usage:none (All patients who use tobacco were given written and verbal information on quitting)  Recent listing of emergency department/hospitalizations over the past year were reviewed.  current specialist the patient sees on a regular basis: cardiology    Medicare annual wellness visit patient questionnaire was reviewed.  A written screening schedule for the patient for the next 5-10 years was given. Appropriate discussion of followup regarding next visit was discussed.      Review of Systems     Objective:   Physical Exam  General-in no acute distress Eyes-no discharge Lungs-respiratory rate normal, CTA CV-no murmurs,RRR Extremities skin warm dry no edema Neuro grossly normal Behavior normal, alert  Prostate exam mild enlargement no hard nodules     Assessment & Plan:  Adult wellness-complete.wellness physical was conducted today. Importance of diet and exercise were discussed in detail.  In addition to this a discussion regarding safety was also covered. We also reviewed over immunizations and gave recommendations regarding current immunization needed for age.  In addition to this additional areas were also touched on including: Preventative health exams needed:  Colonoscopy patient is due December 2022  Patient was advised  yearly wellness exam Left knee nodule more than likely cartilage related if it gets bad enough orthopedics Neurofibroma on the spine specialist recommended to just watch unless the pain gets severe or starts causing weakness for now patient would like to watch this  Chronic pain medications uses it Dilaudid every 3 to 4 days does not abuse the medication Recommended for patient to follow-up every 3 months regarding this and let us know when he needs refills

## 2020-11-13 ENCOUNTER — Other Ambulatory Visit: Payer: Self-pay | Admitting: Student

## 2020-11-17 ENCOUNTER — Other Ambulatory Visit: Payer: Self-pay

## 2020-11-17 ENCOUNTER — Ambulatory Visit (INDEPENDENT_AMBULATORY_CARE_PROVIDER_SITE_OTHER): Payer: Medicare Other | Admitting: Internal Medicine

## 2020-11-17 ENCOUNTER — Other Ambulatory Visit: Payer: Self-pay | Admitting: Cardiology

## 2020-11-17 VITALS — BP 126/74 | HR 62 | Ht 71.0 in | Wt 222.6 lb

## 2020-11-17 DIAGNOSIS — I10 Essential (primary) hypertension: Secondary | ICD-10-CM

## 2020-11-17 DIAGNOSIS — I2510 Atherosclerotic heart disease of native coronary artery without angina pectoris: Secondary | ICD-10-CM

## 2020-11-17 DIAGNOSIS — I442 Atrioventricular block, complete: Secondary | ICD-10-CM | POA: Diagnosis not present

## 2020-11-17 DIAGNOSIS — E782 Mixed hyperlipidemia: Secondary | ICD-10-CM | POA: Diagnosis not present

## 2020-11-17 LAB — CUP PACEART INCLINIC DEVICE CHECK
Battery Remaining Longevity: 35 mo
Battery Voltage: 2.95 V
Brady Statistic AP VP Percent: 20.65 %
Brady Statistic AP VS Percent: 0 %
Brady Statistic AS VP Percent: 79.35 %
Brady Statistic AS VS Percent: 0 %
Brady Statistic RA Percent Paced: 20.64 %
Brady Statistic RV Percent Paced: 99.98 %
Date Time Interrogation Session: 20220803115710
Implantable Lead Implant Date: 20151217
Implantable Lead Implant Date: 20151217
Implantable Lead Implant Date: 20151217
Implantable Lead Location: 753858
Implantable Lead Location: 753859
Implantable Lead Location: 753860
Implantable Lead Model: 4196
Implantable Lead Model: 5076
Implantable Lead Model: 5076
Implantable Pulse Generator Implant Date: 20151217
Lead Channel Impedance Value: 1007 Ohm
Lead Channel Impedance Value: 380 Ohm
Lead Channel Impedance Value: 399 Ohm
Lead Channel Impedance Value: 418 Ohm
Lead Channel Impedance Value: 456 Ohm
Lead Channel Impedance Value: 570 Ohm
Lead Channel Impedance Value: 608 Ohm
Lead Channel Impedance Value: 665 Ohm
Lead Channel Impedance Value: 703 Ohm
Lead Channel Pacing Threshold Amplitude: 0.75 V
Lead Channel Pacing Threshold Amplitude: 0.75 V
Lead Channel Pacing Threshold Amplitude: 1 V
Lead Channel Pacing Threshold Pulse Width: 0.4 ms
Lead Channel Pacing Threshold Pulse Width: 0.4 ms
Lead Channel Pacing Threshold Pulse Width: 0.4 ms
Lead Channel Sensing Intrinsic Amplitude: 0.375 mV
Lead Channel Sensing Intrinsic Amplitude: 1.125 mV
Lead Channel Sensing Intrinsic Amplitude: 22.5 mV
Lead Channel Sensing Intrinsic Amplitude: 22.5 mV
Lead Channel Setting Pacing Amplitude: 2 V
Lead Channel Setting Pacing Amplitude: 2.25 V
Lead Channel Setting Pacing Amplitude: 2.5 V
Lead Channel Setting Pacing Pulse Width: 0.4 ms
Lead Channel Setting Pacing Pulse Width: 0.4 ms
Lead Channel Setting Sensing Sensitivity: 0.9 mV

## 2020-11-17 NOTE — Progress Notes (Signed)
PCP: [name] Drown, MD Primary Cardiologist: [name] Primary EP:  [name] is a 65 y.o. male who presents today for routine electrophysiology followup.  Since last being seen in our clinic, the patient reports doing reasoanbly well.  He has chronic SOB. This has improved with PTCA In march.  He is reasonably active.  Using Keto diet currently and feels better with weight loss. Today, he denies symptoms of palpitations, chest pain,  lower extremity edema, dizziness, presyncope, or syncope.  The patient is otherwise without complaint today.   Past Medical History:  Diagnosis Date   ADD (attention deficit disorder)    Rx-Adderall   Anginal pain ([place])    Aortic atherosclerosis ([place]) 05/27/2020   Seen on x-ray being treated with statin   AV block, 3rd degree ([place])    Colon polyps 2011   tubular adenoma by excisional biopsy during colonoscopy   Complete heart block ([place])    a. s/p PPM Placement in 12/2012 with Medtronic CRT-P placement in 03/2014   Coronary artery disease    a. s/p PCI of the RCA in 2014 b. 08/2018: patent RCA stent with new mid-LAD 85% stenosis (treated with PCI/DES) and 85% distal OM2 stenosis (med management recommended)   Degenerative disc disease, cervical    Gastrointestinal bleeding 10/15/2017   GERD (gastroesophageal reflux disease)    Hypertension    Pacemaker    Palpitations 2003   Holter in 2003: PACs and PVCs; negative stress nuclear test in 2005; right bundle branch block; echo in 2008-mild LVH, otherwise normal; 2008-negative stress nuclear test.   Pneumonia    Prostatitis    prostate calcifications by CT   Sleep apnea    questionable diagnosis   Past Surgical History:  Procedure Laterality Date   ANTERIOR CERVICAL DECOMP/DISCECTOMY FUSION     BACK SURGERY     Biventricular pacemaker upgrade/ system revision  04/02/14   removal of previous atrial and ventricular leads with placement of a new MDT Consulta CRT-P system at  [facility] by [name]   COLONOSCOPY     COLONOSCOPY W/ POLYPECTOMY  2011   tubular adenoma   CORONARY BALLOON ANGIOPLASTY N/A 06/15/2020   Procedure: CORONARY BALLOON ANGIOPLASTY;  Surgeon: [name] Booze, MD;  Location: [place] [facility];  Service: Cardiovascular;  Laterality: N/A;   CORONARY STENT INTERVENTION N/A 08/22/2018   Procedure: CORONARY STENT INTERVENTION;  Surgeon: [name] Sine, MD;  Location: [place] [facility] LAB;  Service: Cardiovascular;  Laterality: N/A;   ESOPHAGOGASTRODUODENOSCOPY     Gastritis   INSERT / REPLACE / REMOVE PACEMAKER  12/17/2012   MDT Adapta L implanted by [name] [name] for mobitz II second degree AV block   INTRAVASCULAR ULTRASOUND/IVUS N/A 06/15/2020   Procedure: Intravascular Ultrasound/IVUS;  Surgeon: [name] Booze, MD;  Location: [place] [facility] LAB;  Service: Cardiovascular;  Laterality: N/A;   KNEE SURGERY Right    LEFT HEART CATH AND CORONARY ANGIOGRAPHY N/A 08/22/2018   Procedure: LEFT HEART CATH AND CORONARY ANGIOGRAPHY;  Surgeon: [name] Sine, MD;  Location: [place] [facility] LAB;  Service: Cardiovascular;  Laterality: N/A;   LEFT HEART CATH AND CORONARY ANGIOGRAPHY N/A 06/15/2020   Procedure: LEFT HEART CATH AND CORONARY ANGIOGRAPHY;  Surgeon: [name] Booze, MD;  Location: [place] [facility];  Service: Cardiovascular;  Laterality: N/A;   LEFT HEART CATHETERIZATION WITH CORONARY ANGIOGRAM N/A 10/17/2012   Procedure: LEFT HEART CATHETERIZATION WITH CORONARY ANGIOGRAM;  Surgeon:  [name] Hector, MD;  Location: [facility] CATH LAB;  Service: Cardiovascular;  Laterality: N/A;   LEFT HEART CATHETERIZATION WITH CORONARY ANGIOGRAM N/A 12/17/2012   Procedure: LEFT HEART CATHETERIZATION WITH CORONARY ANGIOGRAM;  Surgeon: [name] M Martinique, MD;  Location: [facility] CATH LAB;  Service: Cardiovascular;  Laterality: N/A;   LEFT HEART CATHETERIZATION WITH CORONARY ANGIOGRAM N/A 06/11/2013   Procedure: LEFT HEART CATHETERIZATION WITH CORONARY ANGIOGRAM;   Surgeon: [name] Hampshire, MD;  Location: [place] CATH LAB;  Service: Cardiovascular;  Laterality: N/A;   PERCUTANEOUS CORONARY STENT INTERVENTION (PCI-S)  10/17/2012   Procedure: PERCUTANEOUS CORONARY STENT INTERVENTION (PCI-S);  Surgeon: [name] Hector, MD;  Location: [facility] CATH LAB;  Service: Cardiovascular;;   PERMANENT PACEMAKER INSERTION N/A 12/17/2012   Procedure: PERMANENT PACEMAKER INSERTION;  Surgeon: [name] Grayer, MD;  Location: [facility] CATH LAB;  Service: Cardiovascular;  Laterality: N/A;   POLYPECTOMY     UPPER GASTROINTESTINAL ENDOSCOPY      ROS- all systems are reviewed and negative except as per HPI above  Current Outpatient Medications  Medication Sig Dispense Refill   albuterol (VENTOLIN HFA) 108 (90 Base) MCG/ACT inhaler Inhale 2 puffs into the lungs every 4 (four) hours as needed for wheezing. 18 g 2   ALPRAZolam (XANAX) 0.5 MG tablet 1/2-1 twice daily as needed home use only use sparingly 20 tablet 0   amLODipine (NORVASC) 10 MG tablet TAKE 1 TABLET(10 MG) BY MOUTH DAILY 90 tablet 1   aspirin 81 MG chewable tablet Chew 81 mg by mouth daily.     clopidogrel (PLAVIX) 75 MG tablet Take 1 tablet (75 mg total) by mouth daily. 90 tablet 3   HYDROmorphone (DILAUDID) 4 MG tablet Take 1 tablet (4 mg total) by mouth every 6 (six) hours as needed for severe pain. 20 tablet 0   isosorbide mononitrate (IMDUR) 120 MG 24 hr tablet TAKE 1 TABLET(120 MG) BY MOUTH DAILY 90 tablet 1   losartan (COZAAR) 100 MG tablet 1 qd 90 tablet 1   nadolol (CORGARD) 80 MG tablet TAKE 1 TABLET(80 MG) BY MOUTH DAILY 90 tablet 1   nitroGLYCERIN (NITROSTAT) 0.4 MG SL tablet Place 1 tablet (0.4 mg total) under the tongue every 5 (five) minutes as needed for chest pain (MAX 3 TABLETS). 25 tablet 3   promethazine (PHENERGAN) 25 MG tablet TAKE 1 TABLET BY MOUTH TWICE DAILY AS NEEDED FOR NAUSEA (Patient taking differently: Take 25 mg by mouth 2 (two) times daily as needed for nausea or vomiting. TAKE 1 TABLET BY MOUTH TWICE DAILY  AS NEEDED FOR NAUSEA) 30 tablet 5   rosuvastatin (CRESTOR) 20 MG tablet TAKE 1 TABLET(20 MG) BY MOUTH DAILY 90 tablet 1   tamsulosin (FLOMAX) 0.4 MG CAPS capsule TAKE 1 CAPSULE BY MOUTH DAILY 90 capsule 1   hydrALAZINE (APRESOLINE) 25 MG tablet Take 1 tablet (25 mg total) by mouth in the morning and at bedtime. 180 tablet 1   No current facility-administered medications for this visit.    Physical Exam: Vitals:   11/17/20 1138  BP: 126/74  Pulse: 62  SpO2: 96%  Weight: 222 lb 9.6 oz (101 kg)  Height: '5\' 11"'$  (1.803 m)    GEN- The patient is well appearing, alert and oriented x 3 today.   Head- normocephalic, atraumatic Eyes-  Sclera clear, conjunctiva pink Ears- hearing intact Oropharynx- clear Lungs- Clear to ausculation bilaterally, normal work of breathing Chest- pacemaker pocket is well healed Heart- Regular rate and rhythm, 2/6 SEM LUSB which is early peaking GI-  soft, NT, ND, + BS Extremities- no clubbing, cyanosis, or edema  Pacemaker interrogation- reviewed in detail today,  See PACEART report  ekg tracing ordered today is personally reviewed and shows sinus with BiV pacing  Assessment and Plan:  1. Symptomatic complete heart block Normal BiV pacemaker function See Pace Art report No changes today he is device dependant today  2. CAD S/p cath earlier this year  Which showed multivessel CAD.  He underwent PTCA of the mid RCA by [name]  3. HTN Continue cozaar '100mg'$  daily  4. HL Continue crestor '20mg'$  daily  Risks, benefits and potential toxicities for medications prescribed and/or refilled reviewed with patient today.   Return to see me in a year  [name] Grayer MD, [facility] 11/17/2020 11:52 AM

## 2020-11-17 NOTE — Patient Instructions (Signed)
Medication Instructions:  Your physician recommends that you continue on your current medications as directed. Please refer to the Current Medication list given to you today.  *If you need a refill on your cardiac medications before your next appointment, please call your pharmacy*   Lab Work: None ordered.  If you have labs (blood work) drawn today and your tests are completely normal, you will receive your results only by: [place] (if you have MyChart) OR A paper copy in the mail If you have any lab test that is abnormal or we need to change your treatment, we will call you to review the results.   Testing/Procedures: None ordered.    Follow-Up: At [facility], you and your health needs are our priority.  As part of our continuing mission to provide you with exceptional heart care, we have created designated Provider Care Teams.  These Care Teams include your primary Cardiologist (physician) and Advanced Practice Providers (APPs -  Physician Assistants and Nurse Practitioners) who all work together to provide you with the care you need, when you need it.  We recommend signing up for the patient portal called "MyChart".  Sign up information is provided on this After Visit Summary.  MyChart is used to connect with patients for Virtual Visits (Telemedicine).  Patients are able to view lab/test results, encounter notes, upcoming appointments, etc.  Non-urgent messages can be sent to your provider as well.   To learn more about what you can do with MyChart, go to [email or web address].    Your next appointment:   12 month(s)  The format for your next appointment:   In Person  Provider:   Thompson Grayer, MD

## 2020-11-25 ENCOUNTER — Other Ambulatory Visit: Payer: Self-pay | Admitting: Family Medicine

## 2020-12-14 ENCOUNTER — Other Ambulatory Visit: Payer: Self-pay

## 2020-12-14 ENCOUNTER — Encounter: Payer: Self-pay | Admitting: Internal Medicine

## 2020-12-14 ENCOUNTER — Ambulatory Visit: Payer: Medicare Other | Admitting: Internal Medicine

## 2020-12-14 ENCOUNTER — Ambulatory Visit (HOSPITAL_COMMUNITY)
Admission: RE | Admit: 2020-12-14 | Discharge: 2020-12-14 | Disposition: A | Discharge status: Home / Self Care | Payer: Medicare Other | Source: Ambulatory Visit | Attending: Internal Medicine | Admitting: Internal Medicine

## 2020-12-14 DIAGNOSIS — R0600 Dyspnea, unspecified: Secondary | ICD-10-CM

## 2020-12-14 DIAGNOSIS — R0609 Other forms of dyspnea: Secondary | ICD-10-CM

## 2020-12-14 DIAGNOSIS — J9811 Atelectasis: Secondary | ICD-10-CM | POA: Diagnosis not present

## 2020-12-14 DIAGNOSIS — R058 Other specified cough: Secondary | ICD-10-CM

## 2020-12-14 DIAGNOSIS — R0602 Shortness of breath: Secondary | ICD-10-CM | POA: Diagnosis not present

## 2020-12-14 MED ORDER — FAMOTIDINE 20 MG PO TABS: ORAL_TABLET | ORAL | 11 refills | Status: DC

## 2020-12-14 MED ORDER — PANTOPRAZOLE SODIUM 40 MG PO TBEC: 40.0000 mg | DELAYED_RELEASE_TABLET | Freq: Every day | ORAL | 2 refills | Status: DC

## 2020-12-14 NOTE — Assessment & Plan Note (Addendum)
Onset 2014 with IHD/ heart block issues  - L HD elevation noted 05/23/15 with prior only mild eventration - pfts 11/28/16 mild restriction with ERV 36% at wt = 217 -  SNIFF  01/04/17 wnl  - 12/14/2020   Walked on RA x  3  lap(s) =  approx 491f @ very fast pace, stopped due to end of study, min sob with lowest 02 sats 96%    Symptoms are markedly disproportionate to objective findings and not clear to what extent this is actually a pulmonary  problem but pt does appear to have difficult to sort out respiratory symptoms of unknown origin for which  DDX  = almost all start with A and  include Adherence, Ace Inhibitors, Acid Reflux, Active Sinus Disease, Alpha 1 Antitripsin deficiency, Anxiety masquerading as Airways dz,  ABPA,  Allergy(esp in young), Aspiration (esp in elderly), Adverse effects of meds,  Active smoking or Vaping, A bunch of PE's/clot burden (a few small clots can't cause this syndrome unless there is already severe underlying pulm or vascular dz with poor reserve),  Anemia or thyroid disorder, plus two Bs  = Bronchiectasis and Beta blocker use..and one C= CHF   Adherence is always the initial "prime suspect" and is a multilayered concern that requires a "trust but verify" approach in every patient - starting with knowing how to use medications, especially inhalers, correctly, keeping up with refills and understanding the fundamental difference between maintenance and prns vs those medications only taken for a very short course and then stopped and not refilled.   >>>Return with all meds in hand using a trust but verify approach to confirm accurate Medication  Reconciliation The principal here is that until we are certain that the  patients are doing what we've asked, it makes no sense to ask them to do more.   ? Acid (or non-acid) GERD > always difficult to exclude as up to 75% of pts in some series report no assoc GI/ Heartburn symptoms> rec max (24h)  acid suppression and diet restrictions/  reviewed and instructions given in writing.    ? Allergy/asthma > check profile   ? Anemia/ thryoid dz  ? A bunch of PE's > check d dimer    ? BB effects  > note high dose corgard  In the setting of respiratory symptoms of unknown etiology,   would   consider  use bystolic, the most beta -1  selective Beta blocker available in sample form, with bisoprolol the most selective generic choice  on the market, at least on a trial basis, to make sure the spillover Beta 2 effects of the less specific Beta blockers are not contributing to this patient's symptoms.   ? chf > bnp  Pending, cxr not suggestive   >>>rec max rx for gerd/ reconditioning with monitor of 02 sats at peak ex

## 2020-12-14 NOTE — Assessment & Plan Note (Addendum)
Onset ? 2020 with worse but variable  sob  -  Allergy profile 12/14/2020 >  Eos 0. /  IgE  Both pending   Upper airway cough syndrome (previously labeled PNDS),  is so named because it's frequently impossible to sort out how much is  CR/sinusitis with freq throat clearing (which can be related to primary GERD)   vs  causing  secondary (" extra esophageal")  GERD from wide swings in gastric pressure that occur with throat clearing, often  promoting self use of mint and menthol lozenges that reduce the lower esophageal sphincter tone and exacerbate the problem further in a cyclical fashion.   These are the same pts (now being labeled as having "irritable larynx syndrome" by some cough centers) who not infrequently have a history of having failed to tolerate ace inhibitors,  dry powder inhalers or biphosphonates or report having atypical/extraesophageal reflux symptoms that don't respond to standard doses of PPI  and are easily confused as having aecopd or asthma flares by even experienced allergists/ pulmonologists (myself included).    Will start with max rx for gerd and regroup in 6-8 weeks with pfts  Each maintenance medication was reviewed in detail including emphasizing most importantly the difference between maintenance and prns and under what circumstances the prns are to be triggered using an action plan format where appropriate.  Total time for H and P, chart review, counseling,  directly observing portions of ambulatory 02 saturation study/ and generating customized AVS unique to this office visit / same day charting  = 60 min

## 2020-12-14 NOTE — Progress Notes (Addendum)
[name], male    DOB: Aug 17, 1955,    MRN: [id number]   Brief patient profile:  2 yowm quit smoking 2005 referred to pulmonary clinic in [place]  12/14/2020 by Dr Melanie Crazier for doe that improved p stopped smoking and did fine until around 2019 while in [place] then sob > eval in [facility] stent/pacemaker  around 2014 > 7 months > different pacemaker 80% improved then much worse x 2020 gradual decline with stents which didn't help much    [name] on [employer]er 1974-80  100% service connected for ptsd      History of Present Illness  12/14/2020  Pulmonary/ 1st office eval/ Nisha Dhami / [facility]  Chief Complaint  Patient presents with   Consult    Dr. Sallee Lange referred SOB occasionally with moderate exertion and sometimes during resting. Has diagnosed with sleep apnea and has used a C-pap machine briefly. Stopped using the Cpap machine after approx. 8 times. States he can't remember when he started/ stopped using.   Dyspnea: MMRC2 = can't walk a nl pace on a flat grade s sob but does fine slow and flat  Cough: dry coughing spells to point of gagging  with overt HB chronically Sleep: can't tol cpap  SABA use: none   No obvious day to day or daytime variability or assoc excess/ purulent sputum or mucus plugs or hemoptysis or cp or chest tightness, subjective wheeze or overt sinus  symptoms.   Sleeping flat without nocturnal  or early am exacerbation  of respiratory  c/o's or need for noct saba. Also denies any obvious fluctuation of symptoms with weather or environmental changes or other aggravating or alleviating factors except as outlined above   No unusual exposure hx or h/o childhood pna/ asthma or knowledge of premature birth.  Current Allergies, Complete Past Medical History, Past Surgical History, Family History, and Social History were reviewed in [employer] record.  ROS  The following are not active complaints unless bolded Hoarseness,  sore throat, dysphagia, dental problems, itching, sneezing,  nasal congestion or discharge of excess mucus or purulent secretions, ear ache,   fever, chills, sweats, unintended wt loss or wt gain, classically pleuritic or exertional cp,  orthopnea pnd or arm/hand swelling  or leg swelling, presyncope, palpitations, abdominal pain, anorexia, nausea, vomiting, diarrhea  or change in bowel habits or change in bladder habits, change in stools or change in urine, dysuria, hematuria,  rash, arthralgias, visual complaints, headache, numbness, weakness or ataxia or problems with walking or coordination,  change in mood or  memory.           Past Medical History:  Diagnosis Date   ADD (attention deficit disorder)    Rx-Adderall   Anginal pain ([place])    Aortic atherosclerosis ([place]) 05/27/2020   Seen on x-ray being treated with statin   AV block, 3rd degree ([place])    Colon polyps 2011   tubular adenoma by excisional biopsy during colonoscopy   Complete heart block ([place])    a. s/p PPM Placement in 12/2012 with Medtronic CRT-P placement in 03/2014   Coronary artery disease    a. s/p PCI of the RCA in 2014 b. 08/2018: patent RCA stent with new mid-LAD 85% stenosis (treated with PCI/DES) and 85% distal OM2 stenosis (med management recommended)   Degenerative disc disease, cervical    Gastrointestinal bleeding 10/15/2017   GERD (gastroesophageal reflux disease)    Hypertension    Pacemaker    Palpitations  2003   Holter in 2003: PACs and PVCs; negative stress nuclear test in 2005; right bundle branch block; echo in 2008-mild LVH, otherwise normal; 2008-negative stress nuclear test.   Pneumonia    Prostatitis    prostate calcifications by CT   Sleep apnea    questionable diagnosis    Outpatient Medications Prior to Visit - - NOTE:   Unable to verify as accurately reflecting what pt takes    Medication Sig Dispense Refill   ALPRAZolam (XANAX) 0.5 MG tablet 1/2-1 twice daily as needed home use only use  sparingly 20 tablet 0   amLODipine (NORVASC) 10 MG tablet TAKE 1 TABLET(10 MG) BY MOUTH DAILY 90 tablet 1   aspirin 81 MG chewable tablet Chew 81 mg by mouth daily.     albuterol (VENTOLIN HFA) 108 (90 Base) MCG/ACT inhaler Inhale 2 puffs into the lungs every 4 (four) hours as needed for wheezing. (Patient not taking: Reported on 12/14/2020) 18 g 2   clopidogrel (PLAVIX) 75 MG tablet Take 1 tablet (75 mg total) by mouth daily. 90 tablet 3   hydrALAZINE (APRESOLINE) 25 MG tablet Take 1 tablet (25 mg total) by mouth in the morning and at bedtime. 180 tablet 1   HYDROmorphone (DILAUDID) 4 MG tablet Take 1 tablet (4 mg total) by mouth every 6 (six) hours as needed for severe pain. 20 tablet 0   isosorbide mononitrate (IMDUR) 120 MG 24 hr tablet TAKE 1 TABLET(120 MG) BY MOUTH DAILY 90 tablet 1   losartan (COZAAR) 100 MG tablet 1 qd 90 tablet 1   nadolol (CORGARD) 80 MG tablet TAKE 1 TABLET(80 MG) BY MOUTH DAILY 90 tablet 1   nitroGLYCERIN (NITROSTAT) 0.4 MG SL tablet Place 1 tablet (0.4 mg total) under the tongue every 5 (five) minutes as needed for chest pain (MAX 3 TABLETS). 25 tablet 3   promethazine (PHENERGAN) 25 MG tablet TAKE 1 TABLET BY MOUTH TWICE DAILY AS NEEDED FOR NAUSEA (Patient taking differently: Take 25 mg by mouth 2 (two) times daily as needed for nausea or vomiting. TAKE 1 TABLET BY MOUTH TWICE DAILY AS NEEDED FOR NAUSEA) 30 tablet 5   rosuvastatin (CRESTOR) 20 MG tablet TAKE 1 TABLET(20 MG) BY MOUTH DAILY 90 tablet 1   tamsulosin (FLOMAX) 0.4 MG CAPS capsule TAKE 1 CAPSULE BY MOUTH DAILY 90 capsule 1   No facility-administered medications prior to visit.     Objective:     BP 114/68 (BP Location: Left Arm, Patient Position: Sitting, Cuff Size: Normal)   Pulse 67   Temp 98.4 F (36.9 C) (Oral)   Ht '5\' 11"'$  (1.803 m)   Wt 224 lb 1.3 oz (101.6 kg)   SpO2 95%   BMI 31.25 kg/m   SpO2: 95 % Amb wm easily confused with details of care   HEENT : pt wearing mask not removed for  exam due to covid -19 concerns.    NECK :  without JVD/Nodes/TM/ nl carotid upstrokes bilaterally   LUNGS: no acc muscle use,  Nl contour chest which is clear to A and P bilaterally without cough on insp or exp maneuvers   CV:  RRR  no s3 or murmur or increase in P2, and no edema   ABD:  soft and nontender with nl inspiratory excursion in the supine position. No bruits or organomegaly appreciated, bowel sounds nl  MS:  Nl gait/ ext warm without deformities, calf tenderness, cyanosis or clubbing No obvious joint restrictions   SKIN: warm and dry  without lesions    NEURO:  alert, approp, nl sensorium with  no motor or cerebellar deficits apparent.   CXR PA and Lateral:   12/14/2020 :    I personally reviewed images and agree with radiology impression as follows:    Mild left basilar atelectasis. My review:  elevation LHD since at least 2017 worse vs prior studies just suggesting then mild eventration  Labs ordered 12/14/2020   d dimer, bnp, bmet, igE cbc with diff         Assessment   No problem-specific Assessment & Plan notes found for this encounter.     Christinia Gully, MD 12/14/2020

## 2020-12-14 NOTE — Patient Instructions (Signed)
Pantoprazole (protonix) 40 mg   Take  30-60 min before first meal of the day and Pepcid (famotidine)  20 mg after supper until return to office - this is the best way to tell whether stomach acid is contributing to your problem.    GERD (REFLUX)  is an extremely common cause of respiratory symptoms just like yours , many times with no obvious heartburn at all.    It can be treated with medication, but also with lifestyle changes including elevation of the head of your bed (ideally with 6 -8inch blocks under the headboard of your bed),  Smoking cessation, avoidance of late meals, excessive alcohol, and avoid fatty foods, chocolate, peppermint, colas, red wine, and acidic juices such as orange juice.  NO MINT OR MENTHOL PRODUCTS SO NO COUGH DROPS  USE SUGARLESS CANDY INSTEAD (Jolley ranchers or Stover's or Life Savers) or even ice chips will also do - the key is to swallow to prevent all throat clearing. NO OIL BASED VITAMINS - use powdered substitutes.  Avoid fish oil when coughing.   Please remember to go to the lab and x-ray department  for your tests - we will call you with the results when they are available.  To get the most out of exercise, you need to be continuously aware that you are short of breath, but never out of breath, for at least 30 minutes daily. As you improve, it will actually be easier for you to do the same amount of exercise  in  30 minutes so always push to the level where you are short of breath.     Make sure you check your oxygen saturations at highest level of activity   Please schedule a follow up office visit in 6 weeks, call sooner if needed pfts before return with wife and all your medications including over the counters

## 2020-12-16 ENCOUNTER — Encounter: Payer: Self-pay | Admitting: *Deleted

## 2020-12-18 ENCOUNTER — Encounter: Payer: Self-pay | Admitting: Internal Medicine

## 2020-12-18 LAB — BASIC METABOLIC PANEL
BUN/Creatinine Ratio: 27 — ABNORMAL HIGH (ref 10–24)
BUN: 27 mg/dL (ref 8–27)
CO2: 25 mmol/L (ref 20–29)
Calcium: 10 mg/dL (ref 8.6–10.2)
Chloride: 105 mmol/L (ref 96–106)
Creatinine, Ser: 1.01 mg/dL (ref 0.76–1.27)
Glucose: 162 mg/dL — ABNORMAL HIGH (ref 65–99)
Potassium: 4.4 mmol/L (ref 3.5–5.2)
Sodium: 142 mmol/L (ref 134–144)
eGFR: 82 mL/min/{1.73_m2} (ref >59)

## 2020-12-18 LAB — CBC WITH DIFFERENTIAL/PLATELET
Basophils Absolute: 0 10*3/uL (ref 0.0–0.2)
Basos: 1 %
EOS (ABSOLUTE): 0.2 10*3/uL (ref 0.0–0.4)
Eos: 3 %
Hematocrit: 43.8 % (ref 37.5–51.0)
Hemoglobin: 14.4 g/dL (ref 13.0–17.7)
Immature Grans (Abs): 0 10*3/uL (ref 0.0–0.1)
Immature Granulocytes: 0 %
Lymphocytes Absolute: 1.8 10*3/uL (ref 0.7–3.1)
Lymphs: 26 %
MCH: 29 pg (ref 26.6–33.0)
MCHC: 32.9 g/dL (ref 31.5–35.7)
MCV: 88 fL (ref 79–97)
Monocytes Absolute: 0.6 10*3/uL (ref 0.1–0.9)
Monocytes: 9 %
Neutrophils Absolute: 4.2 10*3/uL (ref 1.4–7.0)
Neutrophils: 61 %
Platelets: 208 10*3/uL (ref 150–450)
RBC: 4.96 x10E6/uL (ref 4.14–5.80)
RDW: 13.3 % (ref 11.6–15.4)
WBC: 6.8 10*3/uL (ref 3.4–10.8)

## 2020-12-18 LAB — IGE: IgE (Immunoglobulin E), Serum: 23 IU/mL (ref 6–495)

## 2020-12-18 LAB — BRAIN NATRIURETIC PEPTIDE: BNP: 68.9 pg/mL (ref 0.0–100.0)

## 2020-12-18 LAB — SEDIMENTATION RATE: Sed Rate: 2 mm/hr (ref 0–30)

## 2020-12-18 LAB — TSH: TSH: 1.73 u[IU]/mL (ref 0.450–4.500)

## 2020-12-18 LAB — D-DIMER, QUANTITATIVE: D-DIMER: 0.32 mg/L FEU (ref 0.00–0.49)

## 2020-12-30 ENCOUNTER — Other Ambulatory Visit: Payer: Self-pay | Admitting: Cardiology

## 2021-01-18 ENCOUNTER — Other Ambulatory Visit: Payer: Self-pay | Admitting: Family Medicine

## 2021-01-20 ENCOUNTER — Ambulatory Visit (INDEPENDENT_AMBULATORY_CARE_PROVIDER_SITE_OTHER): Payer: Medicare Other

## 2021-01-20 ENCOUNTER — Other Ambulatory Visit: Payer: Self-pay

## 2021-01-20 ENCOUNTER — Ambulatory Visit: Payer: Medicare Other | Admitting: Cardiology

## 2021-01-20 ENCOUNTER — Encounter: Payer: Self-pay | Admitting: Cardiology

## 2021-01-20 VITALS — BP 130/66 | HR 63 | Ht 71.0 in | Wt 227.6 lb

## 2021-01-20 DIAGNOSIS — I10 Essential (primary) hypertension: Secondary | ICD-10-CM

## 2021-01-20 DIAGNOSIS — I442 Atrioventricular block, complete: Secondary | ICD-10-CM | POA: Diagnosis not present

## 2021-01-20 DIAGNOSIS — I25118 Atherosclerotic heart disease of native coronary artery with other forms of angina pectoris: Secondary | ICD-10-CM

## 2021-01-20 DIAGNOSIS — E782 Mixed hyperlipidemia: Secondary | ICD-10-CM

## 2021-01-20 NOTE — Patient Instructions (Addendum)
Medication Instructions:  Continue all current medications.  Labwork: none  Testing/Procedures: none  Follow-Up: Your physician wants you to follow up in: 5 months.  You will receive a reminder letter in the mail one-two months in advance.  If you don't receive a letter, please call our office to schedule the follow up appointment.  Any Other Special Instructions Will Be Listed Below (If Applicable).   If you need a refill on your cardiac medications before your next appointment, please call your pharmacy.

## 2021-01-20 NOTE — Progress Notes (Signed)
Clinical Summary Mr. Elgersma is a 65 y.o.maleseen today for follow up of the following medical problems.      1. CAD - history of DES to LAD in 08/2018, PCI to RCA in 2014   06/2019 echo: LVEF 60-65%, no WMAs, grade I DDx,  - 09/2019 nuclear stress: small apical infarct with mild peri-infarct ischemia   06/2020 cath: prox LAD 50%, patent LAD stent, LCX 85% distal, RCA 75% with stent ISR. Had PTCA of RCA ISR. From interventional could hold plavix after 30 days if needed      - last visit some fatigue, dizziness, SOB. We lowered his hydralazine to 25mg  bid and lowered imdur to 120mg  daily.  - chest pains at times with walking, resovles with rest. Overall chronic and stable.      2. HTN - he is compliant with meds     3. Complete heart block - pacemaker followed by Dr Rayann Heman, CRT-P      4. Hyperlipidemia    06/2020 TC 119 HDL 52 TG 113 LDL 47       SH; just sold his house, was able to get a great price and only on market for a week. Living in a fifth wheel for the time being.  Recently bought new home.       Past Medical History:  Diagnosis Date   ADD (attention deficit disorder)    Rx-Adderall   Anginal pain ([place])    Aortic atherosclerosis ([place]) 05/27/2020   Seen on x-ray being treated with statin   AV block, 3rd degree ([place])    Colon polyps 2011   tubular adenoma by excisional biopsy during colonoscopy   Complete heart block ([place])    a. s/p PPM Placement in 12/2012 with Medtronic CRT-P placement in 03/2014   Coronary artery disease    a. s/p PCI of the RCA in 2014 b. 08/2018: patent RCA stent with new mid-LAD 85% stenosis (treated with PCI/DES) and 85% distal OM2 stenosis (med management recommended)   Degenerative disc disease, cervical    Gastrointestinal bleeding 10/15/2017   GERD (gastroesophageal reflux disease)    Hypertension    Pacemaker    Palpitations 2003   Holter in 2003: PACs and PVCs; negative stress nuclear test in 2005; right bundle Faraz Ponciano  block; echo in 2008-mild LVH, otherwise normal; 2008-negative stress nuclear test.   Pneumonia    Prostatitis    prostate calcifications by CT   Sleep apnea    questionable diagnosis     Allergies  Allergen Reactions   Morphine And Related Itching and Rash    redness   Lasix [Furosemide]     Chest pain and tightness   Latex Rash     Current Outpatient Medications  Medication Sig Dispense Refill   albuterol (VENTOLIN HFA) 108 (90 Base) MCG/ACT inhaler Inhale 2 puffs into the lungs every 4 (four) hours as needed for wheezing. (Patient not taking: Reported on 12/14/2020) 18 g 2   ALPRAZolam (XANAX) 0.5 MG tablet 1/2-1 twice daily as needed home use only use sparingly 20 tablet 0   amLODipine (NORVASC) 10 MG tablet TAKE 1 TABLET(10 MG) BY MOUTH DAILY 90 tablet 1   aspirin 81 MG chewable tablet Chew 81 mg by mouth daily.     clopidogrel (PLAVIX) 75 MG tablet Take 1 tablet (75 mg total) by mouth daily. 90 tablet 3   famotidine (PEPCID) 20 MG tablet One after supper 30 tablet 11   hydrALAZINE (APRESOLINE) 25 MG tablet  TAKE 1 TABLET(25 MG) BY MOUTH IN THE MORNING AND AT BEDTIME 180 tablet 1   HYDROmorphone (DILAUDID) 4 MG tablet Take 1 tablet (4 mg total) by mouth every 6 (six) hours as needed for severe pain. 20 tablet 0   isosorbide mononitrate (IMDUR) 120 MG 24 hr tablet TAKE 1 TABLET(120 MG) BY MOUTH DAILY 90 tablet 1   losartan (COZAAR) 100 MG tablet 1 qd 90 tablet 1   nadolol (CORGARD) 80 MG tablet TAKE 1 TABLET(80 MG) BY MOUTH DAILY 90 tablet 1   nitroGLYCERIN (NITROSTAT) 0.4 MG SL tablet Place 1 tablet (0.4 mg total) under the tongue every 5 (five) minutes as needed for chest pain (MAX 3 TABLETS). 25 tablet 3   pantoprazole (PROTONIX) 40 MG tablet Take 1 tablet (40 mg total) by mouth daily. Take 30-60 min before first meal of the day 30 tablet 2   promethazine (PHENERGAN) 25 MG tablet TAKE 1 TABLET BY MOUTH TWICE DAILY AS NEEDED FOR NAUSEA (Patient taking differently: Take 25 mg by  mouth 2 (two) times daily as needed for nausea or vomiting. TAKE 1 TABLET BY MOUTH TWICE DAILY AS NEEDED FOR NAUSEA) 30 tablet 5   rosuvastatin (CRESTOR) 20 MG tablet TAKE 1 TABLET(20 MG) BY MOUTH DAILY 90 tablet 1   tamsulosin (FLOMAX) 0.4 MG CAPS capsule TAKE 1 CAPSULE BY MOUTH DAILY 90 capsule 1   No current facility-administered medications for this visit.     Past Surgical History:  Procedure Laterality Date   ANTERIOR CERVICAL DECOMP/DISCECTOMY FUSION     BACK SURGERY     Biventricular pacemaker upgrade/ system revision  04/02/14   removal of previous atrial and ventricular leads with placement of a new MDT Consulta CRT[facility]P system [facility] by Dr Violet Baldy   COLONOSCOPY     COLONOSCOPY W/ POLYPECTOMY  2011   tubular adenoma   CORONARY BALLOON ANGIOPLASTY N/A 06/15/2020   Procedure: [place];  Surgeon: Jettie Booze, MD;  Location: [place] [facility];  Service: Cardiovascular;  Laterality: N/A;   CORONARY STENT INTERVENTION N/A 08/22/2018   Procedure: CORONARY STENT INTERVENTION;  Surgeon: Troy Sine, MD;  Location: [place] [facility];  Service: Cardiovascular;  Laterality: N/A;   ESOPHAGOGASTRODUODENOSCOPY     Gastritis   INSERT / REPLACE / REMOVE PACEMAKER  12/17/2012   MDT Adapta L implanted by Dr Rayann Heman for mobitz II second degree AV block   INTRAVASCULAR ULTRASOUND/IVUS N/A 06/15/2020   Procedure: Intravascular Ultrasound/IVUS;  Surgeon: Jettie Booze, MD;  Location: [place] [facility];  Service: Cardiovascular;  Laterality: N/A;   KNEE SURGERY Right    LEFT HEART CATH AND CORONARY ANGIOGRAPHY N/A 08/22/2018   Procedure: LEFT HEART CATH AND CORONARY ANGIOGRAPHY;  Surgeon: Troy Sine, MD;  Location: [place] [facility];  Service: Cardiovascular;  Laterality: N/A;   LEFT HEART CATH AND CORONARY ANGIOGRAPHY N/A 06/15/2020   Procedure: LEFT HEART CATH AND CORONARY ANGIOGRAPHY;  Surgeon: Jettie Booze, MD;  Location: [place] [facility];  Service: Cardiovascular;  Laterality: N/A;   LEFT HEART CATHETERIZATION WITH CORONARY ANGIOGRAM N/A 10/17/2012   Procedure: LEFT HEART CATHETERIZATION WITH CORONARY ANGIOGRAM;  Surgeon: Josue Hector, MD;  Location[facility] CATH LAB;  Service: Cardiovascular;  Laterality: N/A;   LEFT HEART CATHETERIZATION WITH CORONARY ANGIOGRAM N/A 12/17/2012   Procedure: LEFT HEART CATHETERIZATION WITH CORONARY ANGIOGRAM;  Surgeon: Peter M Martinique, MD;  Location: [facility] CATH LAB;  Service: Cardiovascular;  Laterality: N/A;  LEFT HEART CATHETERIZATION WITH CORONARY ANGIOGRAM N/A 06/11/2013   Procedure: LEFT HEART CATHETERIZATION WITH CORONARY ANGIOGRAM;  Surgeon: Wellington Hampshire, MD;  Location: [place] CATH LAB;  Service: Cardiovascular;  Laterality: N/A;   PERCUTANEOUS CORONARY STENT INTERVENTION (PCI-S)  10/17/2012   Procedure: PERCUTANEOUS CORONARY STENT INTERVENTION (PCI-S);  Surgeon: Josue Hector, MD;  Location: [facility] CATH LAB;  Service: Cardiovascular;;   PERMANENT PACEMAKER INSERTION N/A 12/17/2012   Procedure: PERMANENT PACEMAKER INSERTION;  Surgeon: Thompson Grayer, MD;  Location: [facility] CATH LAB;  Service: Cardiovascular;  Laterality: N/A;   POLYPECTOMY     UPPER GASTROINTESTINAL ENDOSCOPY       Allergies  Allergen Reactions   Morphine And Related Itching and Rash    redness   Lasix [Furosemide]     Chest pain and tightness   Latex Rash      Family History  Problem Relation Age of Onset   Heart disease Mother    Hypertension Mother        Carotid disease   Prostate cancer Brother    Breast cancer Maternal Aunt    Colon cancer Neg Hx    Colon polyps Neg Hx      Social History Mr. Mathieson reports that he quit smoking about 17 years ago. His smoking use included cigarettes. He started smoking about 53 years ago. He has a 100.00 pack-year smoking history. He has quit using smokeless tobacco. Mr. Chason reports current alcohol use of about 1.0 standard drink per week.   Review of  Systems CONSTITUTIONAL: No weight loss, fever, chills, weakness or fatigue.  HEENT: Eyes: No visual loss, blurred vision, double vision or yellow sclerae.No hearing loss, sneezing, congestion, runny nose or sore throat.  SKIN: No rash or itching.  CARDIOVASCULAR: per hpi RESPIRATORY: No shortness of breath, cough or sputum.  GASTROINTESTINAL: No anorexia, nausea, vomiting or diarrhea. No abdominal pain or blood.  GENITOURINARY: No burning on urination, no polyuria NEUROLOGICAL: No headache, dizziness, syncope, paralysis, ataxia, numbness or tingling in the extremities. No change in bowel or bladder control.  MUSCULOSKELETAL: No muscle, back pain, joint pain or stiffness.  LYMPHATICS: No enlarged nodes. No history of splenectomy.  PSYCHIATRIC: No history of depression or anxiety.  ENDOCRINOLOGIC: No reports of sweating, cold or heat intolerance. No polyuria or polydipsia.  Marland Kitchen   Physical Examination Today's Vitals   01/20/21 1147  BP: 130/66  Pulse: 63  SpO2: 95%  Weight: 227 lb 9.6 oz (103.2 kg)  Height: 5\' 11"  (1.803 m)   Body mass index is 31.74 kg/m.  Gen: resting comfortably, no acute distress HEENT: no scleral icterus, pupils equal round and reactive, no palptable cervical adenopathy,  CV: RRR, no m/r/g no jvd Resp: Clear to auscultation bilaterally GI: abdomen is soft, non-tender, non-distended, normal bowel sounds, no hepatosplenomegaly MSK: extremities are warm, no edema.  Skin: warm, no rash Neuro:  no focal deficits Psych: appropriate affect   Diagnostic Studies 09/2019 nuclear stress Findings consistent with prior small apical myocardial infarction with mild peri-infarct ischemia. This is an intermediate risk study. Risk is based on decreased LVEF, consider correlating LVEF with echo. The left ventricular ejection fraction is mildly decreased (43%).     06/2020 cath Previously placed Mid LAD stent (unknown type) is widely patent. Prox LAD lesion is 50%  stenosed. Dist Cx lesion is 85% stenosed. THis is unchanged from prior and quite distal in a small vessel. Medical treatment. Prox Cx lesion is 10% stenosed. Ost Cx lesion is 10% stenosed. Mid RCA  lesion is 75% stenosed. IVUS revealed that this is a 3.5 vessel distally and a 4.0 mm vessel more proximally. Balloon angioplasty was performed (within the old 2.5 mm stent from 2014) using a 2.75 [place] balloon followed by a BALLOON SAPPHIRE [place] 3.5X15 to high pressure. Post intervention, there is a 0% residual stenosis. Followup IVUS revealed significantly better stent expansion. The left ventricular systolic function is normal. LV end diastolic pressure is normal. The left ventricular ejection fraction is 55-65% by visual estimate. There is no aortic valve stenosis.   Decrease Imdur from 180 to 120 mg.    Continue aggressive secondary prevention.     Could hold Plavix after 30 days since this was PTCA only.         Assessment and Plan  1. CAD with chronic stable angina - recent cath as reported above, PTCA of mid RCA - some ongoign chronic stable angina, likely secondary to small vessel residual disease. Did not tolerate higher doses of imdur - chronic stable symptoms that are not limiting, continue current meds. He has some interest in possibly trying viagra in the future, could change imdur to ranexa if he wishes to in the future.   2. HTN - at goal, continue current meds  3. Hyperlipidemia - lipids are at goal, continue current meds     F/u 06/2021, likely can stop plavix at that time.    Arnoldo Lenis, M.D.

## 2021-01-24 ENCOUNTER — Encounter: Payer: Self-pay | Admitting: Family Medicine

## 2021-01-24 ENCOUNTER — Ambulatory Visit (INDEPENDENT_AMBULATORY_CARE_PROVIDER_SITE_OTHER): Payer: Medicare Other | Admitting: Family Medicine

## 2021-01-24 ENCOUNTER — Other Ambulatory Visit: Payer: Self-pay

## 2021-01-24 VITALS — BP 115/73 | HR 77 | Temp 97.2°F | Wt 224.2 lb

## 2021-01-24 DIAGNOSIS — Z23 Encounter for immunization: Secondary | ICD-10-CM | POA: Diagnosis not present

## 2021-01-24 DIAGNOSIS — M79675 Pain in left toe(s): Secondary | ICD-10-CM

## 2021-01-24 DIAGNOSIS — I10 Essential (primary) hypertension: Secondary | ICD-10-CM | POA: Diagnosis not present

## 2021-01-24 DIAGNOSIS — M79674 Pain in right toe(s): Secondary | ICD-10-CM | POA: Diagnosis not present

## 2021-01-24 DIAGNOSIS — E785 Hyperlipidemia, unspecified: Secondary | ICD-10-CM

## 2021-01-24 DIAGNOSIS — Z79899 Other long term (current) drug therapy: Secondary | ICD-10-CM | POA: Diagnosis not present

## 2021-01-24 DIAGNOSIS — E118 Type 2 diabetes mellitus with unspecified complications: Secondary | ICD-10-CM

## 2021-01-24 LAB — CUP PACEART REMOTE DEVICE CHECK
Battery Remaining Longevity: 31 mo
Battery Voltage: 2.94 V
Brady Statistic AP VP Percent: 37.42 %
Brady Statistic AP VS Percent: 0 %
Brady Statistic AS VP Percent: 62.58 %
Brady Statistic AS VS Percent: 0 %
Brady Statistic RA Percent Paced: 37.38 %
Brady Statistic RV Percent Paced: 99.99 %
Date Time Interrogation Session: 20221007142709
Implantable Lead Implant Date: 20151217
Implantable Lead Implant Date: 20151217
Implantable Lead Implant Date: 20151217
Implantable Lead Location: 753858
Implantable Lead Location: 753859
Implantable Lead Location: 753860
Implantable Lead Model: 4196
Implantable Lead Model: 5076
Implantable Lead Model: 5076
Implantable Pulse Generator Implant Date: 20151217
Lead Channel Impedance Value: 342 Ohm
Lead Channel Impedance Value: 380 Ohm
Lead Channel Impedance Value: 380 Ohm
Lead Channel Impedance Value: 437 Ohm
Lead Channel Impedance Value: 532 Ohm
Lead Channel Impedance Value: 570 Ohm
Lead Channel Impedance Value: 646 Ohm
Lead Channel Impedance Value: 665 Ohm
Lead Channel Impedance Value: 950 Ohm
Lead Channel Pacing Threshold Amplitude: 0.75 V
Lead Channel Pacing Threshold Amplitude: 0.75 V
Lead Channel Pacing Threshold Amplitude: 1 V
Lead Channel Pacing Threshold Pulse Width: 0.4 ms
Lead Channel Pacing Threshold Pulse Width: 0.4 ms
Lead Channel Pacing Threshold Pulse Width: 0.4 ms
Lead Channel Sensing Intrinsic Amplitude: 0.5 mV
Lead Channel Sensing Intrinsic Amplitude: 0.5 mV
Lead Channel Sensing Intrinsic Amplitude: 22.5 mV
Lead Channel Sensing Intrinsic Amplitude: 22.5 mV
Lead Channel Setting Pacing Amplitude: 2 V
Lead Channel Setting Pacing Amplitude: 2.25 V
Lead Channel Setting Pacing Amplitude: 2.5 V
Lead Channel Setting Pacing Pulse Width: 0.4 ms
Lead Channel Setting Pacing Pulse Width: 0.4 ms
Lead Channel Setting Sensing Sensitivity: 0.9 mV

## 2021-01-24 NOTE — Progress Notes (Signed)
   Subjective:    Patient ID: [name], male    DOB: 16-Dec-1955, 65 y.o.   MRN: [id number]  HPI Pt here for medication refill. Pt states blood pressure has been good.   Pt does have blurred vision but that is not a new symptom. Seen cardiology last week.    Pt also having bilateral toe pain, right great toe mainly. Possible ingrown toenail. Going on about 6-8 months.   Bilateral shoulder pain for about one month and a half.  Inversion table Hx shoulder injury years ago Pain radiates down the arms Feels weak Radiates into the neck Keeps him awake Worse on his side Tried tylenol Utilizing a tractor frequently  Bno drainage ilat foot pain for months worse recently Drainge none Stings and s aches  Lumbar pain worse with activity Feels like into knots at times Has hx of spine pain and a neuroma tumor Worse from 8 pm onward   Review of Systems     Objective:   Physical Exam  Shoulder discomfort bilateral consistent with possible AC arthritis Rotator cuffs seem normal Lungs are clear heart regular Extremities no edema      Assessment & Plan:  1. Need for vaccination Flu vaccine today - Flu Vaccine QUAD High Dose(Fluad) - Hemoglobin P5V - Basic Metabolic Panel (BMET) - Lipid Profile  2. High risk medication use Labs ordered  3. Hyperlipidemia, unspecified hyperlipidemia type Continue medication check lab work await results - Hemoglobin Z4M - Basic Metabolic Panel (BMET) - Lipid Profile  4. Primary hypertension Blood pressure good control continue current measures - Hemoglobin O7M - Basic Metabolic Panel (BMET) - Lipid Profile  5. Pain in toes of both feet Referral to podiatry for ingrown toenails may need lateral resections on both nails - Ambulatory referral to Podiatry  Shoulder-recommend range of motion exercises Tylenol if not doing better in the next 2 to 3 weeks recommend x-rays follow-up for injections.  Or orthopedic  referral  Diabetes diet controlled check A1c-diet controlled. Has underlying heart disease

## 2021-01-26 ENCOUNTER — Other Ambulatory Visit: Payer: Self-pay | Admitting: Family Medicine

## 2021-01-26 ENCOUNTER — Encounter: Payer: Self-pay | Admitting: Family Medicine

## 2021-01-26 MED ORDER — HYDROMORPHONE HCL 4 MG PO TABS: 4.0000 mg | ORAL_TABLET | Freq: Four times a day (QID) | ORAL | 0 refills | Status: DC | PRN

## 2021-01-27 ENCOUNTER — Other Ambulatory Visit: Payer: Self-pay

## 2021-01-27 ENCOUNTER — Encounter: Payer: Self-pay | Admitting: Podiatrist

## 2021-01-27 ENCOUNTER — Ambulatory Visit: Payer: Medicare Other | Admitting: Podiatrist

## 2021-01-27 DIAGNOSIS — L600 Ingrowing nail: Secondary | ICD-10-CM | POA: Diagnosis not present

## 2021-01-27 DIAGNOSIS — Z0271 Encounter for disability determination: Secondary | ICD-10-CM | POA: Insufficient documentation

## 2021-01-27 NOTE — Patient Instructions (Signed)

## 2021-01-27 NOTE — Progress Notes (Signed)
Chief Complaint  Patient presents with   Nail Problem    BL hallux BL borders x 8 mo (Rt>LT) - no injury - w/ redness, swelling but no drainage - 5/10 throbbing pain - worse with touching toe or shoes Tx: epsom salt soaking, CBD cram and trimming     HPI: Patient is 65 y.o. male who presents today for for pain bilateral great toenail bilateral borders right greater than left.  He relates pain whenever the toe is touched and when closed toed shoes.  He relates some redness and swelling along the toenail borders denies any drainage.  Denies any injury.  He is tried Epsom salt soaks and CBD cream as well as trimming the toenail borders back with no relief in symptoms.  Patient Active Problem List   Diagnosis Date Noted   Encounter for disability determination 01/27/2021   Upper airway cough syndrome 12/14/2020   Aortic atherosclerosis ([place]) 05/27/2020   Acute rhinosinusitis 03/19/2020   Stable angina pectoris ([facility])    Blepharospasm of both eyes 06/10/2018   Dizziness 06/10/2018   Noise effect on both inner ears 06/10/2018   Ischemic colitis ([place])    Hematochezia 10/15/2017   Hyperlipidemia 01/04/2016   Anxiety as acute reaction to exceptional stress 05/28/2015   Inguinal hernia, left 02/11/2015   Acquired complete AV block ([place]) 04/02/2014   Disorder of cardiac pacemaker system 04/02/2014   OSA (obstructive sleep apnea) 10/16/2013   Obstructive sleep apnea 08/25/2013   BPH (benign prostatic hyperplasia) 08/25/2013   Thoracic back pain 08/25/2013   Chest pain, exertional 12/26/2012   Intermediate coronary syndrome ([place]) 12/17/2012   Bradycardia 12/17/2012   Mobitz type II atrioventricular block 12/17/2012   Coronary artery disease of native artery of native heart with stable angina pectoris ([place]) 10/18/2012   S/P drug eluting coronary stent placement 10/18/2012   DOE (dyspnea on exertion) 12/26/2011   Abdominal  pain 12/26/2011   Hypertension    ADD (attention deficit disorder)     Palpitations    GERD (gastroesophageal reflux disease) 12/20/2011   Hx of adenomatous colonic polyps 12/20/2011    Current Outpatient Medications on File Prior to Visit  Medication Sig Dispense Refill   albuterol (VENTOLIN HFA) 108 (90 Base) MCG/ACT inhaler Inhale 2 puffs into the lungs every 4 (four) hours as needed for wheezing. 18 g 2   ALPRAZolam (XANAX) 0.5 MG tablet 1/2-1 twice daily as needed home use only use sparingly 20 tablet 0   amLODipine (NORVASC) 10 MG tablet TAKE 1 TABLET(10 MG) BY MOUTH DAILY 90 tablet 1   aspirin 81 MG chewable tablet Chew 81 mg by mouth daily.     clopidogrel (PLAVIX) 75 MG tablet Take 1 tablet (75 mg total) by mouth daily. 90 tablet 3   famotidine (PEPCID) 20 MG tablet One after supper 30 tablet 11   hydrALAZINE (APRESOLINE) 25 MG tablet TAKE 1 TABLET(25 MG) BY MOUTH IN THE MORNING AND AT BEDTIME 180 tablet 1   HYDROmorphone (DILAUDID) 4 MG tablet Take 1 tablet (4 mg total) by mouth every 6 (six) hours as needed for severe pain. 20 tablet 0   isosorbide mononitrate (IMDUR) 120 MG 24 hr tablet TAKE 1 TABLET(120 MG) BY MOUTH DAILY 90 tablet 1   losartan (COZAAR) 100 MG tablet 1 qd 90 tablet 1   nadolol (CORGARD) 80 MG tablet TAKE 1 TABLET(80 MG) BY MOUTH DAILY 90 tablet 1   nitroGLYCERIN (NITROSTAT) 0.4 MG SL tablet Place 1 tablet (0.4 mg total) under the  tongue every 5 (five) minutes as needed for chest pain (MAX 3 TABLETS). 25 tablet 3   pantoprazole (PROTONIX) 40 MG tablet Take 1 tablet (40 mg total) by mouth daily. Take 30-60 min before first meal of the day 30 tablet 2   promethazine (PHENERGAN) 25 MG tablet TAKE 1 TABLET BY MOUTH TWICE DAILY AS NEEDED FOR NAUSEA (Patient taking differently: Take 25 mg by mouth 2 (two) times daily as needed for nausea or vomiting. TAKE 1 TABLET BY MOUTH TWICE DAILY AS NEEDED FOR NAUSEA) 30 tablet 5   rosuvastatin (CRESTOR) 20 MG tablet TAKE 1 TABLET(20 MG) BY MOUTH DAILY 90 tablet 1   tamsulosin (FLOMAX) 0.4 MG CAPS  capsule TAKE 1 CAPSULE BY MOUTH DAILY 90 capsule 1   No current facility-administered medications on file prior to visit.    Allergies  Allergen Reactions   Morphine And Related Itching and Rash    redness   Lasix [Furosemide]     Chest pain and tightness   Latex Rash    Review of Systems No fevers, chills, nausea, muscle aches, no difficulty breathing, no calf pain, no chest pain or shortness of breath.   Physical Exam  GENERAL APPEARANCE: Alert, conversant. Appropriately groomed. No acute distress.   VASCULAR: Pedal pulses palpable DP and PT bilateral.  Capillary refill time is immediate to all digits,  Proximal to distal cooling it warm to warm.  Digital perfusion adequate.   NEUROLOGIC: sensation is intact to 5.07 monofilament at 5/5 sites bilateral.  Light touch is intact bilateral, vibratory sensation intact bilateral  MUSCULOSKELETAL: acceptable muscle strength, tone and stability bilateral.  No gross boney pedal deformities noted.  No pain, crepitus or limitation noted with foot and ankle range of motion bilateral.   DERMATOLOGIC: skin is warm, supple, and dry.  Bilateral hallux nails have incurvated medial and lateral nail borders right greater than left.  Pain with pressure is noted medial and lateral and dorsal and plantar.  No redness, no swelling, no drainage, no sign of infection is noted.  Ingrowing nail deformity present bilateral.   Assessment   1. Ingrown left greater toenail   2. Ingrown right greater toenail      Plan  Treatment options and alternatives were discussed with the patient.  At this time the patient is having some pain therefore I recommended permanent removal of medial and lateral nail borders of bilateral great toenails.  The patient agreed I prepped the skin with alcohol and infiltrated lidocaine plain in a digital block fashion.  Once anesthetized the toes were did not prepped with iodine and exsanguinated and the medial and lateral nail  borders were then sharply removed.  Phenol was applied to the matrix tissue followed by an alcohol wash and Silvadene cream and a dry sterile compressive dressing was then applied.  The patient was given instructions for soaks and aftercare.  If he notices any redness, swelling or concern for infection he is instructed to call.  Otherwise he will be seen back as needed for follow-up.

## 2021-01-31 NOTE — Progress Notes (Signed)
Remote pacemaker transmission.   

## 2021-02-04 ENCOUNTER — Other Ambulatory Visit: Payer: Self-pay | Admitting: *Deleted

## 2021-02-04 DIAGNOSIS — R0609 Other forms of dyspnea: Secondary | ICD-10-CM

## 2021-02-04 NOTE — Progress Notes (Signed)
Pft ordered  

## 2021-02-07 ENCOUNTER — Encounter: Payer: Self-pay | Admitting: Internal Medicine

## 2021-02-07 ENCOUNTER — Other Ambulatory Visit: Payer: Self-pay

## 2021-02-07 ENCOUNTER — Ambulatory Visit (INDEPENDENT_AMBULATORY_CARE_PROVIDER_SITE_OTHER): Payer: Medicare Other | Admitting: Internal Medicine

## 2021-02-07 ENCOUNTER — Ambulatory Visit: Payer: Medicare Other | Admitting: Internal Medicine

## 2021-02-07 DIAGNOSIS — R0609 Other forms of dyspnea: Secondary | ICD-10-CM

## 2021-02-07 LAB — PULMONARY FUNCTION TEST
DL/VA % pred: 131 %
DL/VA: 5.35 ml/min/mmHg/L
DLCO cor % pred: 97 %
DLCO cor: 27.62 ml/min/mmHg
DLCO unc % pred: 97 %
DLCO unc: 27.62 ml/min/mmHg
FEF 25-75 Post: 2.91 L/sec
FEF 25-75 Pre: 2.17 L/sec
FEF2575-%Change-Post: 33 %
FEF2575-%Pred-Post: 101 %
FEF2575-%Pred-Pre: 75 %
FEV1-%Change-Post: 8 %
FEV1-%Pred-Post: 59 %
FEV1-%Pred-Pre: 54 %
FEV1-Post: 2.17 L
FEV1-Pre: 1.99 L
FEV1FVC-%Change-Post: -1 %
FEV1FVC-%Pred-Pre: 111 %
FEV6-%Change-Post: 10 %
FEV6-%Pred-Post: 56 %
FEV6-%Pred-Pre: 51 %
FEV6-Post: 2.66 L
FEV6-Pre: 2.4 L
FEV6FVC-%Pred-Post: 105 %
FEV6FVC-%Pred-Pre: 105 %
FVC-%Change-Post: 10 %
FVC-%Pred-Post: 54 %
FVC-%Pred-Pre: 48 %
FVC-Post: 2.66 L
FVC-Pre: 2.4 L
Post FEV1/FVC ratio: 82 %
Post FEV6/FVC ratio: 100 %
Pre FEV1/FVC ratio: 83 %
Pre FEV6/FVC Ratio: 100 %
RV % pred: 99 %
RV: 2.46 L
TLC % pred: 72 %
TLC: 5.39 L

## 2021-02-07 NOTE — Addendum Note (Signed)
Addended by: Christinia Gully B on: 02/07/2021 04:42 PM   Modules accepted: Level of Service

## 2021-02-07 NOTE — Assessment & Plan Note (Addendum)
Onset 2014 with IHD/ heart block issues  - L HD elevation noted 05/23/15 with prior only mild eventration and neg sniff 01/04/17  - pfts 11/28/16 mild restriction with ERV 36% at wt = 217 -  SNIFF  01/04/17 wnl  - 12/14/2020   Walked on RA x  3  lap(s) =  approx 446ft @ very fast pace, stopped due to end of study, min sob with lowest 02 sats 96%   - 12/14/20 labs with nl bnp, tsh, hgb, hc03, d dimer, IgE  - PFT's  02/07/2021  FEV1 2.17 (59 % ) ratio 0.82  p 8 % improvement from saba p 0 prior to study with DLCO  27.62 (97%) corrects to 5.35 (130%)  for alv volume and FV curve minimal concavity despite taking nadolol  80 mg  Daily with ERV 15% at wt 230   Reviewed labs and pft results with are c/w restriction secondary to obesity, no significant obstruction though would be concerned about use of nadalol if any tendency to wheeze with allergies/ uri's and strongly prefer bisoprolol in this setting prior to committing him to bronchoditors.  Comment: ie  In the setting of respiratory symptoms of unknown etiology,  It would be preferable to use  bisoprolol the most selective generic choice  on the market, at least on a trial basis, to make sure the spillover Beta 2 effects of the less specific Beta blockers are not contributing to this patient's symptoms.    >>> rec reconditioning/wt loss  and f/u pulmonary prn    Each maintenance medication was reviewed in detail including emphasizing most importantly the difference between maintenance and prns and under what circumstances the prns are to be triggered using an action plan format where appropriate.  Total time for H and P, chart review, counseling, reviewing  and generating customized AVS unique to this summary final office visit / same day charting = 30 min

## 2021-02-07 NOTE — Progress Notes (Addendum)
[name], male    DOB: July 07, 1955    MRN: [id number]   Brief patient profile:  69 yowm quit smoking 2005 referred to pulmonary clinic in [place]  12/14/2020 by [name] for doe that improved p stopped smoking and did fine until around 2019 while in [place] then sob > eval in [facility] stent/pacemaker  around 2014 > 7 months > different pacemaker 80% improved then much worse x 2020 gradual decline with stents which didn't help much    [name] 1975-80  100% service connected for ptsd      History of Present Illness  12/14/2020  Pulmonary/ 1st office eval/ [name] / [name]  Chief Complaint  Patient presents with   Consult    [name] referred SOB occasionally with moderate exertion and sometimes during resting. Has diagnosed with sleep apnea and has used a C-pap machine briefly. Stopped using the Cpap machine after approx. 8 times. States he can't remember when he started/ stopped using.   Dyspnea: MMRC2 = can't walk a nl pace on a flat grade s sob but does fine slow and flat  Cough: dry coughing spells to point of gagging  with overt HB chronically Sleep: can't tol cpap  SABA use: none Rec Pantoprazole (protonix) 40 mg   Take  30-60 min before first meal of the day and Pepcid (famotidine)  20 mg after supper until return to office - this is the best way to tell whether stomach acid is contributing to your problem.  Rec Pantoprazole (protonix) 40 mg   Take  30-60 min before first meal of the day and Pepcid (famotidine)  20 mg after supper until return to office - this is the best way to tell whether stomach acid is contributing to your problem.  GERD diet reviewed, bed blocks rec  To get the most out of exercise, you need to be continuously aware that you are short of breath, but never out of breath, for at least 30 minutes daily. As you improve, it will actually be easier for you to do the same amount of exercise  in  30 minutes so always push to  the level where you are short of breath.    Make sure you check your oxygen saturations at highest level of activity  Please schedule a follow up office visit in 6 weeks, call sooner if needed pfts before return with wife and all your medications including over the counters           02/07/2021  f/u ov/Airyana Sprunger re: doe   maint on no resp rx  / taking nadolol 80 mg daily  Chief Complaint  Patient presents with   Follow-up    PFT done today   Dyspnea:  limited by feet/ toes > breathing  and sats 95% with exertion when has to stop due to legs  Cough: minimal dry  Sleeping: no resp cc  SABA use: not using  02: none  Covid status:   vax x 3    No obvious day to day or daytime variability or assoc excess/ purulent sputum or mucus plugs or hemoptysis or cp or chest tightness, subjective wheeze or overt sinus or hb symptoms.   Sleeping  without nocturnal  or early am exacerbation  of respiratory  c/o's or need for noct saba. Also denies any obvious fluctuation of symptoms with weather or environmental changes or other aggravating or alleviating factors except as outlined above   No  unusual exposure hx or h/o childhood pna/ asthma or knowledge of premature birth.  Current Allergies, Complete Past Medical History, Past Surgical History, Family History, and Social History were reviewed in [employer] record.  ROS  The following are not active complaints unless bolded Hoarseness, sore throat, dysphagia, dental problems, itching, sneezing,  nasal congestion or discharge of excess mucus or purulent secretions, ear ache,   fever, chills, sweats, unintended wt loss or wt gain, classically pleuritic or exertional cp,  orthopnea pnd or arm/hand swelling  or leg swelling, presyncope, palpitations, abdominal pain, anorexia, nausea, vomiting, diarrhea  or change in bowel habits or change in bladder habits, change in stools or change in urine, dysuria, hematuria,  rash, arthralgias,  visual complaints, headache, numbness, weakness or ataxia or problems with walking or coordination,  change in mood or  memory.        Current Meds  Medication Sig   ALPRAZolam (XANAX) 0.5 MG tablet 1/2-1 twice daily as needed home use only use sparingly   amLODipine (NORVASC) 10 MG tablet TAKE 1 TABLET(10 MG) BY MOUTH DAILY   aspirin 81 MG chewable tablet Chew 81 mg by mouth daily.   clopidogrel (PLAVIX) 75 MG tablet Take 1 tablet (75 mg total) by mouth daily.   famotidine (PEPCID) 20 MG tablet One after supper   hydrALAZINE (APRESOLINE) 25 MG tablet TAKE 1 TABLET(25 MG) BY MOUTH IN THE MORNING AND AT BEDTIME   HYDROmorphone (DILAUDID) 4 MG tablet Take 1 tablet (4 mg total) by mouth every 6 (six) hours as needed for severe pain.   isosorbide mononitrate (IMDUR) 120 MG 24 hr tablet TAKE 1 TABLET(120 MG) BY MOUTH DAILY   losartan (COZAAR) 100 MG tablet 1 qd   nadolol (CORGARD) 80 MG tablet TAKE 1 TABLET(80 MG) BY MOUTH DAILY   nitroGLYCERIN (NITROSTAT) 0.4 MG SL tablet Place 1 tablet (0.4 mg total) under the tongue every 5 (five) minutes as needed for chest pain (MAX 3 TABLETS).   pantoprazole (PROTONIX) 40 MG tablet Take 1 tablet (40 mg total) by mouth daily. Take 30-60 min before first meal of the day   promethazine (PHENERGAN) 25 MG tablet TAKE 1 TABLET BY MOUTH TWICE DAILY AS NEEDED FOR NAUSEA (Patient taking differently: Take 25 mg by mouth 2 (two) times daily as needed for nausea or vomiting. TAKE 1 TABLET BY MOUTH TWICE DAILY AS NEEDED FOR NAUSEA)   rosuvastatin (CRESTOR) 20 MG tablet TAKE 1 TABLET(20 MG) BY MOUTH DAILY   tamsulosin (FLOMAX) 0.4 MG CAPS capsule TAKE 1 CAPSULE BY MOUTH DAILY                    Past Medical History:  Diagnosis Date   ADD (attention deficit disorder)    Rx-Adderall   Anginal pain ([name])    Aortic atherosclerosis ([name]) 05/27/2020   Seen on x-ray being treated with statin   AV block, 3rd degree ([name])    Colon polyps 2011   tubular adenoma by  excisional biopsy during colonoscopy   Complete heart block ([name])    a. s/p PPM Placement in 12/2012 with Medtronic CRT-P placement in 03/2014   Coronary artery disease    a. s/p PCI of the RCA in 2014 b. 08/2018: patent RCA stent with new mid-LAD 85% stenosis (treated with PCI/DES) and 85% distal OM2 stenosis (med management recommended)   Degenerative disc disease, cervical    Gastrointestinal bleeding 10/15/2017   GERD (gastroesophageal reflux disease)    Hypertension  Pacemaker    Palpitations 2003   Holter in 2003: PACs and PVCs; negative stress nuclear test in 2005; right bundle branch block; echo in 2008-mild LVH, otherwise normal; 2008-negative stress nuclear test.   Pneumonia    Prostatitis    prostate calcifications by CT   Sleep apnea    questionable diagnosis        Objective:       Wt Readings from Last 3 Encounters:  02/07/21 230 lb (104.3 kg)  01/24/21 224 lb 3.2 oz (101.7 kg)  01/20/21 227 lb 9.6 oz (103.2 kg)      Vital signs reviewed  02/07/2021  - Note at rest 02 sats  96% on RA   General appearance:    amb wm nad    HEENT : pt wearing mask not removed for exam due to covid -19 concerns.    NECK :  without JVD/Nodes/TM/ nl carotid upstrokes bilaterally   LUNGS: no acc muscle use,  Nl contour chest  clear x for prominent pseudowheeze better with PLM without cough on insp or exp maneuvers   CV:  RRR  no s3 or murmur or increase in P2, and no edema   ABD:  obese soft and nontender with nl inspiratory excursion in the supine position. No bruits or organomegaly appreciated, bowel sounds nl  MS:  Nl gait/ ext warm without deformities, calf tenderness, cyanosis or clubbing No obvious joint restrictions   SKIN: warm and dry without lesions    NEURO:  alert, approp, nl sensorium with  no motor or cerebellar deficits apparent.           Assessment

## 2021-02-07 NOTE — Patient Instructions (Addendum)
Eventually you may need an alternative beta blocker like bisoprolol   Pulmonary follow up is as needed

## 2021-02-07 NOTE — Patient Instructions (Signed)
Full PFT performed today. °

## 2021-02-07 NOTE — Progress Notes (Signed)
Full PFT performed today. °

## 2021-02-15 ENCOUNTER — Other Ambulatory Visit: Payer: Self-pay

## 2021-02-15 MED ORDER — TAMSULOSIN HCL 0.4 MG PO CAPS: 0.4000 mg | ORAL_CAPSULE | Freq: Every day | ORAL | 1 refills | Status: DC

## 2021-02-16 ENCOUNTER — Ambulatory Visit: Payer: Medicare Other | Admitting: Internal Medicine

## 2021-02-24 ENCOUNTER — Other Ambulatory Visit: Payer: Self-pay | Admitting: Cardiology

## 2021-02-25 ENCOUNTER — Other Ambulatory Visit: Payer: Self-pay

## 2021-02-25 MED ORDER — LOSARTAN POTASSIUM 100 MG PO TABS: ORAL_TABLET | ORAL | 1 refills | Status: DC

## 2021-03-08 ENCOUNTER — Telehealth: Payer: Self-pay | Admitting: Family Medicine

## 2021-03-08 NOTE — Telephone Encounter (Signed)
  No answer unable to leave a message for patient to call back and schedule Medicare Annual Wellness Visit (AWV) in office.   If unable to come into the office for AWV,  please offer to do virtually or by telephone.  No hx of AWV eligible for AWVI as of  09/15/2020  per [employer]  Please schedule at anytime with [facility]Nurse Health Advisor.      40 Minutes appointment   Any questions, please call me at [phone]

## 2021-03-09 DIAGNOSIS — Z23 Encounter for immunization: Secondary | ICD-10-CM | POA: Diagnosis not present

## 2021-03-09 DIAGNOSIS — I10 Essential (primary) hypertension: Secondary | ICD-10-CM | POA: Diagnosis not present

## 2021-03-09 DIAGNOSIS — E785 Hyperlipidemia, unspecified: Secondary | ICD-10-CM | POA: Diagnosis not present

## 2021-03-10 LAB — LIPID PANEL
Chol/HDL Ratio: 2.3 ratio (ref 0.0–5.0)
Cholesterol, Total: 132 mg/dL (ref 100–199)
HDL: 58 mg/dL (ref >39)
LDL Chol Calc (NIH): 59 mg/dL (ref 0–99)
Triglycerides: 73 mg/dL (ref 0–149)
VLDL Cholesterol Cal: 15 mg/dL (ref 5–40)

## 2021-03-10 LAB — HEMOGLOBIN A1C
Est. average glucose Bld gHb Est-mCnc: 137 mg/dL
Hgb A1c MFr Bld: 6.4 % — ABNORMAL HIGH (ref 4.8–5.6)

## 2021-03-10 LAB — BASIC METABOLIC PANEL
BUN/Creatinine Ratio: 20 (ref 10–24)
BUN: 20 mg/dL (ref 8–27)
CO2: 26 mmol/L (ref 20–29)
Calcium: 9.6 mg/dL (ref 8.6–10.2)
Chloride: 103 mmol/L (ref 96–106)
Creatinine, Ser: 1.01 mg/dL (ref 0.76–1.27)
Glucose: 132 mg/dL — ABNORMAL HIGH (ref 70–99)
Potassium: 4.7 mmol/L (ref 3.5–5.2)
Sodium: 141 mmol/L (ref 134–144)
eGFR: 82 mL/min/{1.73_m2} (ref >59)

## 2021-03-14 DIAGNOSIS — D225 Melanocytic nevi of trunk: Secondary | ICD-10-CM | POA: Diagnosis not present

## 2021-03-14 DIAGNOSIS — L570 Actinic keratosis: Secondary | ICD-10-CM | POA: Diagnosis not present

## 2021-03-14 DIAGNOSIS — D485 Neoplasm of uncertain behavior of skin: Secondary | ICD-10-CM | POA: Diagnosis not present

## 2021-03-14 DIAGNOSIS — L821 Other seborrheic keratosis: Secondary | ICD-10-CM | POA: Diagnosis not present

## 2021-03-15 ENCOUNTER — Ambulatory Visit: Payer: Medicare Other

## 2021-03-22 ENCOUNTER — Other Ambulatory Visit: Payer: Self-pay

## 2021-03-22 ENCOUNTER — Ambulatory Visit (INDEPENDENT_AMBULATORY_CARE_PROVIDER_SITE_OTHER): Payer: Medicare Other

## 2021-03-22 VITALS — Ht 72.0 in | Wt 224.0 lb

## 2021-03-22 DIAGNOSIS — Z0000 Encounter for general adult medical examination without abnormal findings: Secondary | ICD-10-CM | POA: Diagnosis not present

## 2021-03-22 NOTE — Patient Instructions (Signed)
[name] , Thank you for taking time to come for your Medicare Wellness Visit. I appreciate your ongoing commitment to your health goals. Please review the following plan we discussed and let me know if I can assist you in the future.   Screening recommendations/referrals: Colonoscopy: done 03/17/2016 Repeat in 5 years  Recommended yearly ophthalmology/optometry visit for glaucoma screening and checkup Recommended yearly dental visit for hygiene and checkup  Vaccinations: Influenza vaccine: Done 01/24/2021. Repeat annually  Pneumococcal vaccine: Done 04/03/2014 and 07/12/2020. Tdap vaccine: Done 06/15/2008 and 04/07/2017 Repeat in 10 years  Shingles vaccine: Zoster done 12/17/2010.    Covid-19: Done 07/24/2019 and 08/25/2019  Advanced directives: Please bring a copy of your health care power of attorney and living will to the office to be added to your chart at your convenience.   Conditions/risks identified: Aim for 30 minutes of exercise or brisk walking each day, drink 6-8 glasses of water and eat lots of fruits and vegetables.   Next appointment: Follow up in one year for your annual wellness visit. 2023.  Preventive Care 66 Years and Older, Male  Preventive care refers to lifestyle choices and visits with your health care provider that can promote health and wellness. What does preventive care include? A yearly physical exam. This is also called an annual well check. Dental exams once or twice a year. Routine eye exams. Ask your health care provider how often you should have your eyes checked. Personal lifestyle choices, including: Daily care of your teeth and gums. Regular physical activity. Eating a healthy diet. Avoiding tobacco and drug use. Limiting alcohol use. Practicing safe sex. Taking low doses of aspirin every day. Taking vitamin and mineral supplements as recommended by your health care provider. What happens during an annual well check? The services and  screenings done by your health care provider during your annual well check will depend on your age, overall health, lifestyle risk factors, and family history of disease. Counseling  Your health care provider may ask you questions about your: Alcohol use. Tobacco use. Drug use. Emotional well-being. Home and relationship well-being. Sexual activity. Eating habits. History of falls. Memory and ability to understand (cognition). Work and work Statistician. Screening  You may have the following tests or measurements: Height, weight, and BMI. Blood pressure. Lipid and cholesterol levels. These may be checked every 5 years, or more frequently if you are over 72 years old. Skin check. Lung cancer screening. You may have this screening every year starting at age 90 if you have a 30-pack-year history of smoking and currently smoke or have quit within the past 15 years. Fecal occult blood test (FOBT) of the stool. You may have this test every year starting at age 89. Flexible sigmoidoscopy or colonoscopy. You may have a sigmoidoscopy every 5 years or a colonoscopy every 10 years starting at age 77. Prostate cancer screening. Recommendations will vary depending on your family history and other risks. Hepatitis C blood test. Hepatitis B blood test. Sexually transmitted disease (STD) testing. Diabetes screening. This is done by checking your blood sugar (glucose) after you have not eaten for a while (fasting). You may have this done every 1-3 years. Abdominal aortic aneurysm (AAA) screening. You may need this if you are a current or former smoker. Osteoporosis. You may be screened starting at age 29 if you are at high risk. Talk with your health care provider about your test results, treatment options, and if necessary, the need for more tests. Vaccines  Your  health care provider may recommend certain vaccines, such as: Influenza vaccine. This is recommended every year. Tetanus, diphtheria, and  acellular pertussis (Tdap, Td) vaccine. You may need a Td booster every 10 years. Zoster vaccine. You may need this after age 43. Pneumococcal 13-valent conjugate (PCV13) vaccine. One dose is recommended after age 44. Pneumococcal polysaccharide (PPSV23) vaccine. One dose is recommended after age 52. Talk to your health care provider about which screenings and vaccines you need and how often you need them. This information is not intended to replace advice given to you by your health care provider. Make sure you discuss any questions you have with your health care provider. Document Released: 04/30/2015 Document Revised: 12/22/2015 Document Reviewed: 02/02/2015 Elsevier Interactive Patient Education  2017 [place] Prevention in the Home Falls can cause injuries. They can happen to people of all ages. There are many things you can do to make your home safe and to help prevent falls. What can I do on the outside of my home? Regularly fix the edges of walkways and driveways and fix any cracks. Remove anything that might make you trip as you walk through a door, such as a raised step or threshold. Trim any bushes or trees on the path to your home. Use bright outdoor lighting. Clear any walking paths of anything that might make someone trip, such as rocks or tools. Regularly check to see if handrails are loose or broken. Make sure that both sides of any steps have handrails. Any raised decks and porches should have guardrails on the edges. Have any leaves, snow, or ice cleared regularly. Use sand or salt on walking paths during winter. Clean up any spills in your garage right away. This includes oil or grease spills. What can I do in the bathroom? Use night lights. Install grab bars by the toilet and in the tub and shower. Do not use towel bars as grab bars. Use non-skid mats or decals in the tub or shower. If you need to sit down in the shower, use a plastic, non-slip stool. Keep  the floor dry. Clean up any water that spills on the floor as soon as it happens. Remove soap buildup in the tub or shower regularly. Attach bath mats securely with double-sided non-slip rug tape. Do not have throw rugs and other things on the floor that can make you trip. What can I do in the bedroom? Use night lights. Make sure that you have a light by your bed that is easy to reach. Do not use any sheets or blankets that are too big for your bed. They should not hang down onto the floor. Have a firm chair that has side arms. You can use this for support while you get dressed. Do not have throw rugs and other things on the floor that can make you trip. What can I do in the kitchen? Clean up any spills right away. Avoid walking on wet floors. Keep items that you use a lot in easy-to-reach places. If you need to reach something above you, use a strong step stool that has a grab bar. Keep electrical cords out of the way. Do not use floor polish or wax that makes floors slippery. If you must use wax, use non-skid floor wax. Do not have throw rugs and other things on the floor that can make you trip. What can I do with my stairs? Do not leave any items on the stairs. Make sure that there are handrails  on both sides of the stairs and use them. Fix handrails that are broken or loose. Make sure that handrails are as long as the stairways. Check any carpeting to make sure that it is firmly attached to the stairs. Fix any carpet that is loose or worn. Avoid having throw rugs at the top or bottom of the stairs. If you do have throw rugs, attach them to the floor with carpet tape. Make sure that you have a light switch at the top of the stairs and the bottom of the stairs. If you do not have them, ask someone to add them for you. What else can I do to help prevent falls? Wear shoes that: Do not have high heels. Have rubber bottoms. Are comfortable and fit you well. Are closed at the toe. Do not  wear sandals. If you use a stepladder: Make sure that it is fully opened. Do not climb a closed stepladder. Make sure that both sides of the stepladder are locked into place. Ask someone to hold it for you, if possible. Clearly mark and make sure that you can see: Any grab bars or handrails. First and last steps. Where the edge of each step is. Use tools that help you move around (mobility aids) if they are needed. These include: Canes. Walkers. Scooters. Crutches. Turn on the lights when you go into a dark area. Replace any light bulbs as soon as they burn out. Set up your furniture so you have a clear path. Avoid moving your furniture around. If any of your floors are uneven, fix them. If there are any pets around you, be aware of where they are. Review your medicines with your doctor. Some medicines can make you feel dizzy. This can increase your chance of falling. Ask your doctor what other things that you can do to help prevent falls. This information is not intended to replace advice given to you by your health care provider. Make sure you discuss any questions you have with your health care provider. Document Released: 01/28/2009 Document Revised: 09/09/2015 Document Reviewed: 05/08/2014 Elsevier Interactive Patient Education  2017 [employer].

## 2021-03-22 NOTE — Progress Notes (Signed)
Subjective:   [name] is a 65 y.o. male who presents for an Initial Medicare Annual Wellness Visit. Virtual Visit via Telephone Note  I connected with  [name] on 03/22/21 at  1:40 PM EST by telephone and verified that I am speaking with the correct person using two identifiers.  Location: Patient: Home Provider: [facility] Persons participating in the virtual visit: patient/Nurse Health Advisor   I discussed the limitations, risks, security and privacy concerns of performing an evaluation and management service by telephone and the availability of in person appointments. The patient expressed understanding and agreed to proceed.  Interactive audio and video telecommunications were attempted between this nurse and patient, however failed, due to patient having technical difficulties OR patient did not have access to video capability.  We continued and completed visit with audio only.  Some vital signs may be absent or patient reported.   Chriss Driver, LPN  Review of Systems     Cardiac Risk Factors include: advanced age (>40men, >28 women);hypertension;dyslipidemia;male gender;sedentary lifestyle;obesity (BMI >30kg/m2)     Objective:    Today's Vitals   03/22/21 1335 03/22/21 1337  Weight: 224 lb (101.6 kg)   Height: 6' (1.829 m)   PainSc:  6    Body mass index is 30.38 kg/m.  Advanced Directives 03/22/2021 06/15/2020 08/22/2018 03/21/2018 03/19/2018 03/07/2018 03/05/2018  Does Patient Have a Medical Advance Directive? Yes Yes Yes Yes Yes Yes Yes  Type of Paramedic of [place];Living will Living will;Healthcare Power of [place];Living will Living will Living will Living will Living will  Does patient want to make changes to medical advance directive? - No - Guardian declined No - Patient declined - - - -  Copy of [place] in Chart? No - copy requested No - copy requested - - - - -   Pre-existing out of facility DNR order (yellow form or pink MOST form) - - - - - - -    Current Medications (verified) Outpatient Encounter Medications as of 03/22/2021  Medication Sig   albuterol (VENTOLIN HFA) 108 (90 Base) MCG/ACT inhaler Inhale 2 puffs into the lungs every 4 (four) hours as needed for wheezing.   ALPRAZolam (XANAX) 0.5 MG tablet 1/2-1 twice daily as needed home use only use sparingly   amLODipine (NORVASC) 5 MG tablet 1 tab(s) orally once a day for 30 day(s)   aspirin 81 MG chewable tablet 1 tab(s) chewed once a day for 30 day(s)   clopidogrel (PLAVIX) 75 MG tablet 1 tab(s) orally once a day for 30 day(s)   famotidine (PEPCID) 20 MG tablet One after supper   gabapentin (NEURONTIN) 100 MG capsule 1 cap(s) orally 2 times a day   hydrALAZINE (APRESOLINE) 25 MG tablet TAKE 1 TABLET(25 MG) BY MOUTH IN THE MORNING AND AT BEDTIME   HYDROmorphone (DILAUDID) 4 MG tablet Take 1 tablet (4 mg total) by mouth every 6 (six) hours as needed for severe pain.   isosorbide mononitrate (IMDUR) 120 MG 24 hr tablet TAKE 1 TABLET(120 MG) BY MOUTH DAILY   losartan (COZAAR) 100 MG tablet 1 tab(s) orally once a day for 90 day(s)   nitroGLYCERIN (NITROSTAT) 0.4 MG SL tablet Place 1 tablet (0.4 mg total) under the tongue every 5 (five) minutes as needed for chest pain (MAX 3 TABLETS).   pantoprazole (PROTONIX) 40 MG tablet Take 1 tablet (40 mg total) by mouth daily. Take 30-60 min before first meal  of the day   promethazine (PHENERGAN) 25 MG tablet TAKE 1 TABLET BY MOUTH TWICE DAILY AS NEEDED FOR NAUSEA (Patient taking differently: Take 25 mg by mouth 2 (two) times daily as needed for nausea or vomiting. TAKE 1 TABLET BY MOUTH TWICE DAILY AS NEEDED FOR NAUSEA)   rosuvastatin (CRESTOR) 20 MG tablet TAKE 1 TABLET(20 MG) BY MOUTH DAILY   sertraline (ZOLOFT) 25 MG tablet 1 tab(s) orally once a day for 30 day(s)   tamsulosin (FLOMAX) 0.4 MG CAPS capsule Take 1 capsule (0.4 mg total) by mouth daily.    [DISCONTINUED] amLODipine (NORVASC) 10 MG tablet TAKE 1 TABLET(10 MG) BY MOUTH DAILY   [DISCONTINUED] aspirin 81 MG chewable tablet Chew 81 mg by mouth daily.   [DISCONTINUED] clopidogrel (PLAVIX) 75 MG tablet Take 1 tablet (75 mg total) by mouth daily.   [DISCONTINUED] losartan (COZAAR) 100 MG tablet Take one tablet po daily   [DISCONTINUED] nadolol (CORGARD) 80 MG tablet TAKE 1 TABLET(80 MG) BY MOUTH DAILY   No facility-administered encounter medications on file as of 03/22/2021.    Allergies (verified) Morphine and related, Lasix [furosemide], and Latex   History: Past Medical History:  Diagnosis Date   ADD (attention deficit disorder)    Rx-Adderall   Anginal pain ([place])    Aortic atherosclerosis ([place]) 05/27/2020   Seen on x-ray being treated with statin   AV block, 3rd degree ([place])    Colon polyps 2011   tubular adenoma by excisional biopsy during colonoscopy   Complete heart block ([place])    a. s/p PPM Placement in 12/2012 with Medtronic CRT-P placement in 03/2014   Coronary artery disease    a. s/p PCI of the RCA in 2014 b. 08/2018: patent RCA stent with new mid-LAD 85% stenosis (treated with PCI/DES) and 85% distal OM2 stenosis (med management recommended)   Degenerative disc disease, cervical    Gastrointestinal bleeding 10/15/2017   GERD (gastroesophageal reflux disease)    Hypertension    Pacemaker    Palpitations 2003   Holter in 2003: PACs and PVCs; negative stress nuclear test in 2005; right bundle branch block; echo in 2008-mild LVH, otherwise normal; 2008-negative stress nuclear test.   Pneumonia    Prostatitis    prostate calcifications by CT   Sleep apnea    questionable diagnosis   Past Surgical History:  Procedure Laterality Date   ANTERIOR CERVICAL DECOMP/DISCECTOMY FUSION     BACK SURGERY     Biventricular pacemaker upgrade/ system revision  04/02/14   removal of previous atrial and ventricular leads with placement of a new MDT Consulta CRT-P system at  [facility] by Dr Violet Baldy   COLONOSCOPY     COLONOSCOPY W/ POLYPECTOMY  2011   tubular adenoma   CORONARY BALLOON ANGIOPLASTY N/A 06/15/2020   Procedure: [place];  Surgeon: Jettie Booze, MD;  Location: [place] [facility];  Service: Cardiovascular;  Laterality: N/A;   CORONARY STENT INTERVENTION N/A 08/22/2018   Procedure: CORONARY STENT INTERVENTION;  Surgeon: Troy Sine, MD;  Location: [place] [facility];  Service: Cardiovascular;  Laterality: N/A;   ESOPHAGOGASTRODUODENOSCOPY     Gastritis   INSERT / REPLACE / REMOVE PACEMAKER  12/17/2012   MDT Adapta L implanted by Dr Rayann Heman for mobitz II second degree AV block   INTRAVASCULAR ULTRASOUND/IVUS N/A 06/15/2020   Procedure: Intravascular Ultrasound/IVUS;  Surgeon: Jettie Booze, MD;  Location: [place] [facility];  Service: Cardiovascular;  Laterality: N/A;   KNEE SURGERY  Right    LEFT HEART CATH AND CORONARY ANGIOGRAPHY N/A 08/22/2018   Procedure: LEFT HEART CATH AND CORONARY ANGIOGRAPHY;  Surgeon: Troy Sine, MD;  Location: [place] [facility];  Service: Cardiovascular;  Laterality: N/A;   LEFT HEART CATH AND CORONARY ANGIOGRAPHY N/A 06/15/2020   Procedure: LEFT HEART CATH AND CORONARY ANGIOGRAPHY;  Surgeon: Jettie Booze, MD;  Location: [place] [facility];  Service: Cardiovascular;  Laterality: N/A;   LEFT HEART CATHETERIZATION WITH CORONARY ANGIOGRAM N/A 10/17/2012   Procedure: LEFT HEART CATHETERIZATION WITH CORONARY ANGIOGRAM;  Surgeon: Josue Hector, MD;  Location: [facility] CATH LAB;  Service: Cardiovascular;  Laterality: N/A;   LEFT HEART CATHETERIZATION WITH CORONARY ANGIOGRAM N/A 12/17/2012   Procedure: LEFT HEART CATHETERIZATION WITH CORONARY ANGIOGRAM;  Surgeon: Peter M Martinique, MD;  Location: [facility] CATH LAB;  Service: Cardiovascular;  Laterality: N/A;   LEFT HEART CATHETERIZATION WITH CORONARY ANGIOGRAM N/A 06/11/2013   Procedure: LEFT HEART CATHETERIZATION WITH CORONARY ANGIOGRAM;   Surgeon: Wellington Hampshire, MD;  Location: [place] CATH LAB;  Service: Cardiovascular;  Laterality: N/A;   PERCUTANEOUS CORONARY STENT INTERVENTION (PCI-S)  10/17/2012   Procedure: PERCUTANEOUS CORONARY STENT INTERVENTION (PCI-S);  Surgeon: Josue Hector, MD;  Location: [facility] C[facility] LAB;  Service: Cardiovascular;;   PERMANENT PACEMAKER INSERTION N/A 12/17/2012   Procedure: PERMANENT PACEMAKER INSERTION;  Surgeon: Thompson Grayer, MD;  Location: [facility] CATH LAB;  Service: Cardiovascular;  Laterality: N/A;   POLYPECTOMY     UPPER GASTROINTESTINAL ENDOSCOPY     Family History  Problem Relation Age of Onset   Heart disease Mother    Hypertension Mother        Carotid disease   Prostate cancer Brother    Breast cancer Maternal Aunt    Colon cancer Neg Hx    Colon polyps Neg Hx    Social History   Socioeconomic History   Marital status: Married    Spouse name: Not on file   Number of children: 3   Years of education: Not on file   Highest education level: Not on file  Occupational History   Not on file  Tobacco Use   Smoking status: Former    Packs/day: 2.50    Years: 40.00    Pack years: 100.00    Types: Cigarettes    Start date: 04/18/1967    Quit date: 12/20/2003    Years since quitting: 17.2   Smokeless tobacco: Former  Scientific laboratory technician Use: Former  Substance and Sexual Activity   Alcohol use: Yes    Alcohol/week: 1.0 standard drink    Types: 1 Cans of beer per week   Drug use: Not Currently    Types: Marijuana    Comment: 30 + years ago - "crank, marjiuanna, ect."   Sexual activity: Yes    Birth control/protection: Surgical  Other Topics Concern   Not on file  Social History Narrative   Retired Therapist, art.   Social Determinants of Health   Financial Resource Strain: Low Risk    Difficulty of Paying Living Expenses: Not hard at all  Food Insecurity: No Food Insecurity   Worried About Charity fundraiser in the Last Year: Never true   [place] in the Last Year: Never true   Transportation Needs: No Transportation Needs   Lack of Transportation (Medical): No   Lack of Transportation (Non-Medical): No  Physical Activity: Insufficiently Active   Days of Exercise per Week: 2 days   Minutes of Exercise  per Session: 30 min  Stress: Stress Concern Present   Feeling of Stress : Rather much  Social Connections: Socially Isolated   Frequency of Communication with Friends and Family: Never   Frequency of Social Gatherings with Friends and Family: Never   Attends Religious Services: Never   Marine scientist or Organizations: No   Attends Music therapist: Never   Marital Status: Married    Tobacco Counseling Counseling given: Not Answered   Clinical Intake:  Pre-visit preparation completed: Yes  Pain : 0-10 Pain Score: 6  Pain Type: Chronic pain Pain Location: Back Pain Descriptors / Indicators: Aching Pain Onset: More than a month ago Pain Frequency: Intermittent     BMI - recorded: 30.38 Nutritional Status: BMI > 30  Obese Nutritional Risks: None Diabetes: No  How often do you need to have someone help you when you read instructions, pamphlets, or other written materials from your doctor or pharmacy?: 1 - Never  Diabetic?No  Interpreter Needed?: No  Information entered by :: MJ Quinn Quam, LPN   Activities of Daily Living In your present state of health, do you have any difficulty performing the following activities: 03/22/2021 03/08/2021  Hearing? N N  Vision? N N  Difficulty concentrating or making decisions? Tempie Donning  Walking or climbing stairs? N N  Dressing or bathing? N N  Doing errands, shopping? N N  Preparing Food and eating ? N N  Using the Toilet? N N  In the past six months, have you accidently leaked urine? N N  Do you have problems with loss of bowel control? N N  Managing your Medications? N Y  Managing your Finances? N Y  Housekeeping or managing your Housekeeping? N N  Some recent data might be hidden     Patient Care Team: Kathyrn Drown, MD as PCP - General (Family Medicine) Thompson Grayer, MD as PCP - Electrophysiology (Cardiology) Harl Bowie Alphonse Guild, MD as PCP - Cardiology (Cardiology) Rothbart, Cristopher Estimable, MD (Inactive) (Cardiology) Harl Bowie Alphonse Guild, MD as Consulting Physician (Cardiology)  Indicate any recent Medical Services you may have received from other than [facility] providers in the past year (date may be approximate).     Assessment:   This is a routine wellness examination for [place].  Hearing/Vision screen Hearing Screening - Comments:: Some hearing loss.  Vision Screening - Comments:: Read[facility]. [facility]. 2020.  Dietary issues and exercise activities discussed: Current Exercise Habits: Home exercise routine, Type of exercise: walking, Time (Minutes): 30, Frequency (Times/Week): 2, Weekly Exercise (Minutes/Week): 60, Intensity: Mild, Exercise limited by: cardiac condition(s)   Goals Addressed             This Visit's Progress    Have 3 meals a day       Pt would like to lose weight. Pt would like to move to [place].        Depression Screen PHQ 2/9 Scores 03/22/2021 10/27/2020 07/12/2020 05/27/2020 07/09/2019 05/10/2017 01/18/2015  PHQ - 2 Score 2 0 0 0 - 0 0  PHQ- 9 Score 5 - - - - 3 -  Exception Documentation - - - - Patient refusal - -    Fall Risk Fall Risk  03/22/2021 03/08/2021 03/08/2021 01/24/2021 10/27/2020  Falls in the past year? 0 0 0 0 0  Number falls in past yr: 0 0 0 0 0  Injury with Fall? 0 - - 0 0  Risk for fall due to : Impaired balance/gait;Impaired mobility - -  No Fall Risks No Fall Risks  Follow up Falls prevention discussed - - Falls evaluation completed Falls evaluation completed    FALL RISK PREVENTION PERTAINING TO THE HOME:  Any stairs in or around the home? Yes  If so, are there any without handrails? No  Home free of loose throw rugs in walkways, pet beds, electrical cords, etc? Yes  Adequate lighting in your home to  reduce risk of falls? Yes   ASSISTIVE DEVICES UTILIZED TO PREVENT FALLS:  Life alert? No  Use of a cane, walker or w/c? No  Grab bars in the bathroom? Yes  Shower chair or bench in shower? No  Elevated toilet seat or a handicapped toilet? Yes   TIMED UP AND GO:  Was the test performed? No .  Phone visit.  Cognitive Function: Patient declined.  Normal cognitive status assessed by direct observation by this Nurse Health Advisor. No abnormalities found.          Immunizations Immunization History  Administered Date(s) Administered   Fluad Quad(high Dose 65+) 01/24/2021   Influenza Inj Mdck Quad With Preservative 01/30/2018   Influenza Split 02/05/2013   Influenza,inj,Quad PF,6+ Mos 01/20/2014, 01/19/2017   Influenza-Unspecified 01/16/2012, 01/15/2018, 02/12/2018, 01/30/2019, 03/26/2020   PFIZER(Purple Top)SARS-COV-2 Vaccination 07/24/2019, 08/25/2019   Pneumococcal Polysaccharide-23 04/03/2014, 07/12/2020   Td 06/15/2008   Tdap 04/07/2017   Zoster, Live 12/17/2010    TDAP status: Up to date  Flu Vaccine status: Up to date  Pneumococcal vaccine status: Up to date  Covid-19 vaccine status: Information provided on how to obtain vaccines.   Qualifies for Shingles Vaccine? Yes   Zostavax completed Yes   Shingrix Completed?: No.    Education has been provided regarding the importance of this vaccine. Patient has been advised to call insurance company to determine out of pocket expense if they have not yet received this vaccine. Advised may also receive vaccine at local pharmacy or Health Dept. Verbalized acceptance and understanding.  Screening Tests Health Maintenance  Topic Date Due   Zoster Vaccines- Shingrix (1 of 2) Never done   COLONOSCOPY (Pts 45-54yrs Insurance coverage will need to be confirmed)  03/17/2021   Pneumonia Vaccine 33+ Years old (3 - PCV) 07/12/2021   TETANUS/TDAP  04/08/2027   INFLUENZA VACCINE  Completed   Hepatitis C Screening  Completed   HPV  VACCINES  Aged Out   COVID-19 Vaccine  Discontinued    Health Maintenance  Health Maintenance Due  Topic Date Due   Zoster Vaccines- Shingrix (1 of 2) Never done   COLONOSCOPY (Pts 45-22yrs Insurance coverage will need to be confirmed)  03/17/2021    Colorectal cancer screening: Type of screening: Colonoscopy. Completed 03/17/2016. Repeat every 5 years  Lung Cancer Screening: (Low Dose CT Chest recommended if Age 71-80 years, 30 pack-year currently smoking OR have quit w/in 15years.) does not qualify.  Quit smoking over 15 years ago.  Additional Screening:  Hepatitis C Screening: does qualify; Completed 05/08/2018  Vision Screening: Recommended annual ophthalmology exams for early detection of glaucoma and other disorders of the eye. Is the patient up to date with their annual eye exam?  No  Who is the provider or what is the name of the office in which the patient attends annual eye exams? [facility] If pt is not established with a provider, would they like to be referred to a provider to establish care? No .   Dental Screening: Recommended annual dental exams for proper oral hygiene  [employer]  Referral / Chronic Care Management: CRR required this visit?  No   CCM required this visit?  No      Plan:     I have personally reviewed and noted the following in the patient's chart:   Medical and social history Use of alcohol, tobacco or illicit drugs  Current medications and supplements including opioid prescriptions. Patient is currently taking opioid prescriptions. Information provided to patient regarding non-opioid alternatives. Patient advised to discuss non-opioid treatment plan with their provider. Functional ability and status Nutritional status Physical activity Advanced directives List of other physicians Hospitalizations, surgeries, and ER visits in previous 12 months Vitals Screenings to include cognitive, depression, and falls Referrals and  appointments  In addition, I have reviewed and discussed with patient certain preventive protocols, quality metrics, and best practice recommendations. A written personalized care plan for preventive services as well as general preventive health recommendations were provided to patient.     Chriss Driver, LPN   [id number]/11/[id number]   Nurse Notes: PHONE VISIT. PT AT HOME. NURSE AT [facility]. Pt overdue for eye exam. Declined referral at this time. Overdue for Colonoscopy. Pt states GI has reached out to schedule and pt requested to schedule after Christmas. Up to date on all vaccines. Pt declined 6CIT.

## 2021-03-23 ENCOUNTER — Telehealth: Payer: Self-pay | Admitting: Family Medicine

## 2021-03-23 NOTE — Telephone Encounter (Signed)
Please advise. Thank you

## 2021-03-23 NOTE — Telephone Encounter (Signed)
Patient is requesting appointment for continued shoulder pain that being going on for months. Please advise where to place on schedule

## 2021-03-23 NOTE — Telephone Encounter (Signed)
Late morning office visit with myself either this coming week or the following which ever works best for patient thank you

## 2021-03-23 NOTE — Telephone Encounter (Signed)
Please contact patient to have him set up appt per provider. Thank you

## 2021-03-28 ENCOUNTER — Encounter: Payer: Self-pay | Admitting: Gastroenterology

## 2021-04-05 ENCOUNTER — Other Ambulatory Visit: Payer: Self-pay

## 2021-04-05 ENCOUNTER — Ambulatory Visit (INDEPENDENT_AMBULATORY_CARE_PROVIDER_SITE_OTHER): Payer: Medicare Other | Admitting: Family Medicine

## 2021-04-05 ENCOUNTER — Encounter: Payer: Self-pay | Admitting: Family Medicine

## 2021-04-05 VITALS — BP 118/72 | Temp 97.7°F | Wt 233.2 lb

## 2021-04-05 DIAGNOSIS — M25512 Pain in left shoulder: Secondary | ICD-10-CM

## 2021-04-05 DIAGNOSIS — M25511 Pain in right shoulder: Secondary | ICD-10-CM

## 2021-04-05 DIAGNOSIS — E538 Deficiency of other specified B group vitamins: Secondary | ICD-10-CM | POA: Diagnosis not present

## 2021-04-05 DIAGNOSIS — E669 Obesity, unspecified: Secondary | ICD-10-CM | POA: Diagnosis not present

## 2021-04-05 DIAGNOSIS — E118 Type 2 diabetes mellitus with unspecified complications: Secondary | ICD-10-CM

## 2021-04-05 MED ORDER — PREGABALIN 50 MG PO CAPS: ORAL_CAPSULE | ORAL | 0 refills | Status: DC

## 2021-04-05 MED ORDER — HYDROMORPHONE HCL 4 MG PO TABS
4.0000 mg | ORAL_TABLET | Freq: Four times a day (QID) | ORAL | 0 refills | Status: DC | PRN
Start: 2021-04-05 — End: 2021-07-21

## 2021-04-05 NOTE — Progress Notes (Signed)
° °  Subjective:    Patient ID: [name], male    DOB: [birth year], 65 y.o.   MRN: [id number]  HPI Pt here for recheck on shoulder pain. Pt did exercises but states that seemed to inflame it more. Right shoulder is worse since he is right handed. Left should also painful.   Pt would like to speak with provider about DM. Feet tingling and legs aching more at night. Pt would like to inquire about the injection for weight loss/diabetes.   Pt needing refill on Dilaudid-takes prn.  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 Ellene Route is following  He also has tingling in his feet that causes his legs to ache as well.  Not having any falls related to this.  Review of Systems     Objective:   Physical Exam  General-in no acute distress Eyes-no discharge Lungs-respiratory rate normal, CTA CV-no murmurs,RRR Extremities skin warm dry no edema Neuro grossly normal Behavior normal, alert  Pulses in the feet are normal.  Foot exam normal.     Assessment & Plan:  Bilateral shoulder pain-referral to orthopedics.  Regular stretching exercises was tried did not tolerate well may well need injections possible imaging and possible physical therapy  Chronic low back pain as well as leg pain switch from gabapentin to Lyrica 50 mg twice daily.  Caution drowsiness.  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 Ellene Route and I have encouraged him to follow-up with Dr. Ellene Route to have a myelogram in this area

## 2021-04-06 ENCOUNTER — Ambulatory Visit: Payer: Self-pay

## 2021-04-06 ENCOUNTER — Ambulatory Visit: Payer: Medicare Other | Admitting: Orthopaedic Surgery

## 2021-04-06 ENCOUNTER — Encounter: Payer: Self-pay | Admitting: Orthopaedic Surgery

## 2021-04-06 VITALS — BP 157/82 | HR 73 | Ht 71.0 in | Wt 233.0 lb

## 2021-04-06 DIAGNOSIS — M545 Low back pain, unspecified: Secondary | ICD-10-CM | POA: Diagnosis not present

## 2021-04-06 DIAGNOSIS — M25512 Pain in left shoulder: Secondary | ICD-10-CM

## 2021-04-06 DIAGNOSIS — G8929 Other chronic pain: Secondary | ICD-10-CM

## 2021-04-06 DIAGNOSIS — M25511 Pain in right shoulder: Secondary | ICD-10-CM

## 2021-04-06 NOTE — Progress Notes (Signed)
Office Visit Note   Patient: [name]           Date of Birth: 1955-12-02           MRN: [id number] Visit Date: 04/06/2021              Requested by: Kathyrn Drown, MD [place],  [place] [address] PCP: Kathyrn Drown, MD   Assessment & Plan: Visit Diagnoses:  1. Acute pain of both shoulders   2. Chronic bilateral low back pain, unspecified whether sciatica present     Plan: We will set patient up for some physical therapy in [place] which is convenient and close to his home.  Follow-Up Instructions: Return in about 6 weeks (around 05/18/2021).   Orders:  Orders Placed This Encounter  Procedures   XR Shoulder Right   XR Shoulder Left   Ambulatory referral to Physical Therapy   No orders of the defined types were placed in this encounter.     Procedures: No procedures performed   Clinical Data: No additional findings.   Subjective: Chief Complaint  Patient presents with   Right Shoulder - Pain   Left Shoulder - Pain   Lower Back - Pain    HPI 65 year old new patient visit with bilateral shoulder pain worse in the right than left times greater than 2 months.  Patient has increased pain when he is on an inversion table for his chronic back pain.  Patient's 100% disabled through the [place] with PTSD.  Patient is use Dilaudid intermittently for severe pain.  Also Tylenol 3-4 at a time.  Patient states that he has had pain with motion of his shoulders not able to throw objects has trouble reaching out and grabbing.  He went through a home exercise program which caused increased pain.  Patient also has low back pain and wants to discuss possible lumbar injection.  He was told he had nerve root tumor causing sciatica pain in both legs worse on left than right.  He is comfortable in the feeding position when he lays down.  He has seen Dr. Ellene Route for years no recent injections but states it really did not help.  Patient thinks may be needs another  myelogram.  He not had any lumbar surgery.  Previous lumbar CT myelo done in August 2019 showed enlargement of the left L4 nerve root consistent with neurofibroma.  Minimal bulging no compression L2-3 and L3-4.  CT scan shows solid fusion from C5-C7.  Some disc degeneration at C3-4 and C4-5 with anterior extradural defects.  Some foraminal stenosis worse on the left at C3-4 and bilaterally at C4-5.  Review of Systems positive history of angina.  Recent stent placed coronary.  History of sleep apnea hypertension hyperlipidemia.  All other systems noncontributory to HPI.   Objective: Vital Signs: BP (!) 157/82    Pulse 73    Ht 5\' 11"  (1.803 m)    Wt 233 lb (105.7 kg)    BMI 32.50 kg/m   Physical Exam Constitutional:      Appearance: He is well-developed.  HENT:     Head: Normocephalic and atraumatic.     Right Ear: External ear normal.     Left Ear: External ear normal.  Eyes:     Pupils: Pupils are equal, round, and reactive to light.  Neck:     Thyroid: No thyromegaly.     Trachea: No tracheal deviation.  Cardiovascular:     Rate  and Rhythm: Normal rate.  Pulmonary:     Effort: Pulmonary effort is normal.     Breath sounds: No wheezing.  Abdominal:     General: Bowel sounds are normal.     Palpations: Abdomen is soft.  Musculoskeletal:     Cervical back: Neck supple.  Skin:    General: Skin is warm and dry.     Capillary Refill: Capillary refill takes less than 2 seconds.  Neurological:     Mental Status: He is alert and oriented to person, place, and time.  Psychiatric:        Behavior: Behavior normal.        Thought Content: Thought content normal.        Judgment: Judgment normal.    Ortho Exam Patient has pain with active shoulder range of motion not with passive.  Positive impingement negative drop arm test.  Worse pain with impingement on the right versus left.  Wrist pulses are normal.  Biceps triceps is strong.  Pacemaker left upper chest wall.  Well-healed  anterior neck incision from previous anterior cervical.  Positive biceps tenderness with palpation worse on the right than left shoulder.  Negative Yergason.  Negative drop arm test.  Imaging: No results found.   PMFS History: Patient Active Problem List   Diagnosis Date Noted   Encounter for disability determination 01/27/2021   Upper airway cough syndrome 12/14/2020   Aortic atherosclerosis ([place]) 05/27/2020   Acute rhinosinusitis 03/19/2020   Stable angina pectoris (H[facility])    Blepharospasm of both eyes 06/10/2018   Dizziness 06/10/2018   Noise effect on both inner ears 06/10/2018   Ischemic colitis ([place])    Hematochezia 10/15/2017   Hyperlipidemia 01/04/2016   Anxiety as acute reaction to exceptional stress 05/28/2015   Inguinal hernia, left 02/11/2015   Acquired complete AV block ([place]) 04/02/2014   Disorder of cardiac pacemaker system 04/02/2014   OSA (obstructive sleep apnea) 10/16/2013   Obstructive sleep apnea 08/25/2013   BPH (benign prostatic hyperplasia) 08/25/2013   Thoracic back pain 08/25/2013   Chest pain, exertional 12/26/2012   Intermediate coronary syndrome ([place]) 12/17/2012   Bradycardia 12/17/2012   Mobitz type II atrioventricular block 12/17/2012   Coronary artery disease of native artery of native heart with stable angina pectoris ([place]) 10/18/2012   S/P drug eluting coronary stent placement 10/18/2012   DOE (dyspnea on exertion) 12/26/2011   Abdominal  pain 12/26/2011   Hypertension    ADD (attention deficit disorder)    Palpitations    GERD (gastroesophageal reflux disease) 12/20/2011   Hx of adenomatous colonic polyps 12/20/2011   Past Medical History:  Diagnosis Date   ADD (attention deficit disorder)    Rx-Adderall   Anginal pain ([place])    Aortic atherosclerosis ([place]) 05/27/2020   Seen on x-ray being treated with statin   AV block, 3rd degree ([place])    Colon polyps 2011   tubular adenoma by excisional biopsy during colonoscopy   Complete heart  block ([place])    a. s/p PPM Placement in 12/2012 with Medtronic CRT-P placement in 03/2014   Coronary artery disease    a. s/p PCI of the RCA in 2014 b. 08/2018: patent RCA stent with new mid-LAD 85% stenosis (treated with PCI/DES) and 85% distal OM2 stenosis (med management recommended)   Degenerative disc disease, cervical    Gastrointestinal bleeding 10/15/2017   GERD (gastroesophageal reflux disease)    Hypertension    Pacemaker    Palpitations 2003   Holter in  2003: PACs and PVCs; negative stress nuclear test in 2005; right bundle branch block; echo in 2008-mild LVH, otherwise normal; 2008-negative stress nuclear test.   Pneumonia    Prostatitis    prostate calcifications by CT   Sleep apnea    questionable diagnosis    Family History  Problem Relation Age of Onset   Heart disease Mother    Hypertension Mother        Carotid disease   Prostate cancer Brother    Breast cancer Maternal Aunt    Colon cancer Neg Hx    Colon polyps Neg Hx     Past Surgical History:  Procedure Laterality Date   ANTERIOR CERVICAL DECOMP/DISCECTOMY FUSION     BACK SURGERY     Biventricular pacemaker upgrade/ system revision  04/02/14   removal of previous atrial and ventricular leads with placement of a new MDT Consulta CRT-P system at [facility] by Dr Violet Baldy   COLONOSCOPY     COLONOSCOPY W/ POLYPECTOMY  2011   tubular adenoma   CORONARY BALLOON ANGIOPLASTY N/A 06/15/2020   Procedure: [place];  Surgeon: Jettie Booze, MD;  Location: [facility] LAB;  Service: Cardiovascular;  Laterality: N/A;   CORONARY STENT INTERVENTION N/A 08/22/2018   Procedure: CORONARY STENT INTERVENTION;  Surgeon: Troy Sine, MD;  Location: [facility];  Service: Cardiovascular;  Laterality: N/A;   ESOPHAGOGASTRODUODENOSCOPY     Gastritis   INSERT / REPLACE / REMOVE PACEMAKER  12/17/2012   MDT Adapta L implanted by Dr Rayann Heman for mobitz II second degree AV block    INTRAVASCULAR ULTRASOUND/IVUS N/A 06/15/2020   Procedure: Intravascular Ultrasound/IVUS;  Surgeon: Jettie Booze, MD;  Location: [facility];  Service: Cardiovascular;  Laterality: N/A;   KNEE SURGERY Right    LEFT HEART CATH AND CORONARY ANGIOGRAPHY N/A 08/22/2018   Procedure: LEFT HEART CATH AND CORONARY ANGIOGRAPHY;  Surgeon: Troy Sine, MD;  Location: [facility];  Service: Cardiovascular;  Laterality: N/A;   LEFT HEART CATH AND CORONARY ANGIOGRAPHY N/A 06/15/2020   Procedure: LEFT HEART CATH AND CORONARY ANGIOGRAPHY;  Surgeon: Jettie Booze, MD;  Location: [facility] LAB;  Service: Cardiovascular;  Laterality: N/A;   LEFT HEART CATHETERIZATION WITH CORONARY ANGIOGRAM N/A 10/17/2012   Procedure: LEFT HEART CATHETERIZATION WITH CORONARY ANGIOGRAM;  Surgeon: Josue Hector, MD;  Location: [facility] CATH LAB;  Service: Cardiovascular;  Laterality: N/A;   LEFT HEART CATHETERIZATION WITH CORONARY ANGIOGRAM N/A 12/17/2012   Procedure: LEFT HEART CATHETERIZATION WITH CORONARY ANGIOGRAM;  Surgeon: Peter M Martinique, MD;  Location: [facility] CATH LAB;  Service: Cardiovascular;  Laterality: N/A;   LEFT HEART CATHETERIZATION WITH CORONARY ANGIOGRAM N/A 06/11/2013   Procedure: LEFT HEART CATHETERIZATION WITH CORONARY ANGIOGRAM;  Surgeon: Wellington Hampshire, MD;  Location: [facility] CATH LAB;  Service: Cardiovascular;  Laterality: N/A;   PERCUTANEOUS CORONARY STENT INTERVENTION (PCI-S)  10/17/2012   Procedure: PERCUTANEOUS CORONARY STENT INTERVENTION (PCI-S);  Surgeon: Josue Hector, MD;  Location: [facility] CATH LAB;  Service: Cardiovascular;;   PERMANENT PACEMAKER INSERTION N/A 12/17/2012   Procedure: PERMANENT PACEMAKER INSERTION;  Surgeon: Thompson Grayer, MD;  Location: [facility] CATH LAB;  Service: Cardiovascular;  Laterality: N/A;   POLYPECTOMY     UPPER GASTROINTESTINAL ENDOSCOPY     Social History   Occupational History   Not on file  Tobacco Use   Smoking status: Former    Packs/day: 2.50    Years: 40.00     Pack  years: 100.00    Types: Cigarettes    Start date: 04/18/1967    Quit date: 12/20/2003    Years since quitting: 17.3   Smokeless tobacco: Former  Scientific laboratory technician Use: Former  Substance and Sexual Activity   Alcohol use: Yes    Alcohol/week: 1.0 standard drink    Types: 1 Cans of beer per week   Drug use: Not Currently    Types: Marijuana    Comment: 30 + years ago - "crank, marjiuanna, ect."   Sexual activity: Yes    Birth control/protection: Surgical

## 2021-04-21 ENCOUNTER — Ambulatory Visit (INDEPENDENT_AMBULATORY_CARE_PROVIDER_SITE_OTHER): Payer: Medicare Other

## 2021-04-21 DIAGNOSIS — I442 Atrioventricular block, complete: Secondary | ICD-10-CM

## 2021-04-25 LAB — CUP PACEART REMOTE DEVICE CHECK
Battery Remaining Longevity: 30 mo
Battery Voltage: 2.93 V
Brady Statistic AP VP Percent: 36.16 %
Brady Statistic AP VS Percent: 0 %
Brady Statistic AS VP Percent: 63.83 %
Brady Statistic AS VS Percent: 0 %
Brady Statistic RA Percent Paced: 36.05 %
Brady Statistic RV Percent Paced: 99.98 %
Date Time Interrogation Session: 20230106181352
Implantable Lead Implant Date: 20151217
Implantable Lead Implant Date: 20151217
Implantable Lead Implant Date: 20151217
Implantable Lead Location: 753858
Implantable Lead Location: 753859
Implantable Lead Location: 753860
Implantable Lead Model: 4196
Implantable Lead Model: 5076
Implantable Lead Model: 5076
Implantable Pulse Generator Implant Date: 20151217
Lead Channel Impedance Value: 342 Ohm
Lead Channel Impedance Value: 380 Ohm
Lead Channel Impedance Value: 399 Ohm
Lead Channel Impedance Value: 437 Ohm
Lead Channel Impedance Value: 551 Ohm
Lead Channel Impedance Value: 570 Ohm
Lead Channel Impedance Value: 646 Ohm
Lead Channel Impedance Value: 665 Ohm
Lead Channel Impedance Value: 950 Ohm
Lead Channel Pacing Threshold Amplitude: 0.75 V
Lead Channel Pacing Threshold Amplitude: 0.75 V
Lead Channel Pacing Threshold Amplitude: 1 V
Lead Channel Pacing Threshold Pulse Width: 0.4 ms
Lead Channel Pacing Threshold Pulse Width: 0.4 ms
Lead Channel Pacing Threshold Pulse Width: 0.4 ms
Lead Channel Sensing Intrinsic Amplitude: 1.5 mV
Lead Channel Sensing Intrinsic Amplitude: 1.5 mV
Lead Channel Sensing Intrinsic Amplitude: 22.5 mV
Lead Channel Sensing Intrinsic Amplitude: 22.5 mV
Lead Channel Setting Pacing Amplitude: 2 V
Lead Channel Setting Pacing Amplitude: 2.25 V
Lead Channel Setting Pacing Amplitude: 2.5 V
Lead Channel Setting Pacing Pulse Width: 0.4 ms
Lead Channel Setting Pacing Pulse Width: 0.4 ms
Lead Channel Setting Sensing Sensitivity: 0.9 mV

## 2021-05-02 NOTE — Progress Notes (Signed)
Remote pacemaker transmission.   

## 2021-05-11 ENCOUNTER — Other Ambulatory Visit: Payer: Self-pay | Admitting: Family Medicine

## 2021-05-11 ENCOUNTER — Other Ambulatory Visit: Payer: Self-pay

## 2021-05-11 MED ORDER — PREGABALIN 50 MG PO CAPS: ORAL_CAPSULE | ORAL | 3 refills | Status: DC

## 2021-05-12 ENCOUNTER — Other Ambulatory Visit: Payer: Self-pay | Admitting: Internal Medicine

## 2021-05-14 ENCOUNTER — Other Ambulatory Visit: Payer: Self-pay | Admitting: Cardiology

## 2021-05-16 ENCOUNTER — Other Ambulatory Visit: Payer: Self-pay | Admitting: Cardiology

## 2021-05-18 ENCOUNTER — Ambulatory Visit: Payer: Medicare Other | Admitting: Orthopaedic Surgery

## 2021-05-23 ENCOUNTER — Other Ambulatory Visit: Payer: Self-pay

## 2021-05-23 ENCOUNTER — Ambulatory Visit: Payer: Medicare Other | Attending: Orthopaedic Surgery | Admitting: Physical Therapy

## 2021-05-23 ENCOUNTER — Encounter: Payer: Self-pay | Admitting: Physical Therapy

## 2021-05-23 DIAGNOSIS — M25612 Stiffness of left shoulder, not elsewhere classified: Secondary | ICD-10-CM | POA: Diagnosis not present

## 2021-05-23 DIAGNOSIS — G8929 Other chronic pain: Secondary | ICD-10-CM | POA: Diagnosis not present

## 2021-05-23 DIAGNOSIS — M25512 Pain in left shoulder: Secondary | ICD-10-CM | POA: Insufficient documentation

## 2021-05-23 DIAGNOSIS — M25511 Pain in right shoulder: Secondary | ICD-10-CM | POA: Insufficient documentation

## 2021-05-23 DIAGNOSIS — M25611 Stiffness of right shoulder, not elsewhere classified: Secondary | ICD-10-CM | POA: Insufficient documentation

## 2021-05-23 MED ORDER — ROSUVASTATIN CALCIUM 20 MG PO TABS: ORAL_TABLET | ORAL | 1 refills | Status: DC

## 2021-05-23 NOTE — Therapy (Signed)
[facility] [place], [place], [address] Phone: [phone]   Fax:  [phone]  Physical Therapy Evaluation  Patient Details  Name: [name] MRN: [id number] Date of Birth: 30-Sep-1955 Referring Provider (PT): Rodell Perna MD   Encounter Date: 05/23/2021   PT End of Session - 05/23/21 1609     Visit Number 1    Number of Visits 12    Date for PT Re-Evaluation 07/04/21    PT Start Time 0153    PT Stop Time 0228    PT Time Calculation (min) 35 min    Activity Tolerance Patient tolerated treatment well    Behavior During Therapy [facility] for tasks assessed/performed             Past Medical History:  Diagnosis Date   ADD (attention deficit disorder)    Rx-Adderall   Anginal pain ([place])    Aortic atherosclerosis ([place]) 05/27/2020   Seen on x-ray being treated with statin   AV block, 3rd degree ([place])    Colon polyps 2011   tubular adenoma by excisional biopsy during colonoscopy   Complete heart block ([place])    a. s/p PPM Placement in 12/2012 with Medtronic CRT-P placement in 03/2014   Coronary artery disease    a. s/p PCI of the RCA in 2014 b. 08/2018: patent RCA stent with new mid-LAD 85% stenosis (treated with PCI/DES) and 85% distal OM2 stenosis (med management recommended)   Degenerative disc disease, cervical    Gastrointestinal bleeding 10/15/2017   GERD (gastroesophageal reflux disease)    Hypertension    Pacemaker    Palpitations 2003   Holter in 2003: PACs and PVCs; negative stress nuclear test in 2005; right bundle branch block; echo in 2008-mild LVH, otherwise normal; 2008-negative stress nuclear test.   Pneumonia    Prostatitis    prostate calcifications by CT   Sleep apnea    questionable diagnosis    Past Surgical History:  Procedure Laterality Date   ANTERIOR CERVICAL DECOMP/DISCECTOMY FUSION     BACK SURGERY     Biventricular pacemaker upgrade/ system revision  04/02/14   removal of previous  atrial and ventricular leads with placement of a new MDT Consulta CRT-P system at [facility] by Dr Violet Baldy   COLONOSCOPY     COLONOSCOPY W/ POLYPECTOMY  2011   tubular adenoma   CORONARY BALLOON ANGIOPLASTY N/A 06/15/2020   Procedure: [place];  Surgeon: Jettie Booze, MD;  Location: [facility] CV LAB;  Service: Cardiovascular;  Laterality: N/A;   CORONARY STENT INTERVENTION N/A 08/22/2018   Procedure: CORONARY STENT INTERVENTION;  Surgeon: Troy Sine, MD;  Location: [place] [facility] LAB;  Service: Cardiovascular;  Laterality: N/A;   ESOPHAGOGASTRODUODENOSCOPY     Gastritis   INSERT / REPLACE / REMOVE PACEMAKER  12/17/2012   MDT Adapta L implanted by Dr Rayann Heman for mobitz II second degree AV block   INTRAVASCULAR ULTRASOUND/IVUS N/A 06/15/2020   Procedure: Intravascular Ultrasound/IVUS;  Surgeon: Jettie Booze, MD;  Location: [place] CV LAB;  Service: Cardiovascular;  Laterality: N/A;   KNEE SURGERY Right    LEFT HEART CATH AND CORONARY ANGIOGRAPHY N/A 08/22/2018   Procedure: LEFT HEART CATH AND CORONARY ANGIOGRAPHY;  Surgeon: Troy Sine, MD;  Location: [facility];  Service: Cardiovascular;  Laterality: N/A;   LEFT HEART CATH AND CORONARY ANGIOGRAPHY N/A 06/15/2020   Procedure: LEFT HEART CATH AND CORONARY ANGIOGRAPHY;  Surgeon: Jettie Booze, MD;  Location: [facility] LAB;  Service: Cardiovascular;  Laterality: N/A;   LEFT HEART CATHETERIZATION WITH CORONARY ANGIOGRAM N/A 10/17/2012   Procedure: LEFT HEART CATHETERIZATION WITH CORONARY ANGIOGRAM;  Surgeon: Josue Hector, MD;  Location: [facility] CATH LAB;  Service: Cardiovascular;  Laterality: N/A;   LEFT HEART CATHETERIZATION WITH CORONARY ANGIOGRAM N/A 12/17/2012   Procedure: LEFT HEART CATHETERIZATION WITH CORONARY ANGIOGRAM;  Surgeon: Peter M Martinique, MD;  Location: [facility] CATH LAB;  Service: Cardiovascular;  Laterality: N/A;   LEFT HEART CATHETERIZATION WITH CORONARY ANGIOGRAM N/A  06/11/2013   Procedure: LEFT HEART CATHETERIZATION WITH CORONARY ANGIOGRAM;  Surgeon: Wellington Hampshire, MD;  Location: [facility] CATH LAB;  Service: Cardiovascular;  Laterality: N/A;   PERCUTANEOUS CORONARY STENT INTERVENTION (PCI-S)  10/17/2012   Procedure: PERCUTANEOUS CORONARY STENT INTERVENTION (PCI-S);  Surgeon: Josue Hector, MD;  Location: [facility] CATH LAB;  Service: Cardiovascular;;   PERMANENT PACEMAKER INSERTION N/A 12/17/2012   Procedure: PERMANENT PACEMAKER INSERTION;  Surgeon: Thompson Grayer, MD;  Location: [facility] CATH LAB;  Service: Cardiovascular;  Laterality: N/A;   POLYPECTOMY     UPPER GASTROINTESTINAL ENDOSCOPY      There were no vitals filed for this visit.    Subjective Assessment - 05/23/21 1456     Subjective COVID-19 screen performed prior to patient entering clinic.  The patient presents to the [place] [facility] today with c/o bilateral shoulder pain.  His right tends to be worse than the left.  He states his pain began about a year ago when he was on an inversion table with both arms stretched overhead.  His shoulder pain is rated at a 6/10 today and he describes the pain as a sharp pain but will also ache and throb.  Certain movements, such as reach back cause severe pain.  Resting and sleep decrease his pain but he states when he awakes he is in a lot of pain.    Pertinent History ACDF, back surgery, h/o spinal pain (under Chiropratic care for this), heart problems, pacemaker, DDD.    Patient Stated Goals Use shoulders without pain and aoid surgery.    Currently in Pain? Yes    Pain Score 6     Pain Location Shoulder    Pain Orientation Right;Left    Pain Descriptors / Indicators Sharp;Aching;Throbbing    Pain Type Chronic pain    Pain Onset More than a month ago    Pain Frequency Constant                [facility] PT Assessment - 05/23/21 0001       Assessment   Medical Diagnosis Acute bilateral shoulder pain.    Referring Provider (PT) Rodell Perna MD    Onset Date/Surgical Date --   ~ a  year.   Hand Dominance Right      Precautions   Precautions ICD/Pacemaker      Restrictions   Weight Bearing Restrictions No      Balance Screen   Has the patient fallen in the past 6 months No    Has the patient had a decrease in activity level because of a fear of falling?  No    Is the patient reluctant to leave their home because of a fear of falling?  No      Home Ecologist residence      Prior Function   Level of Independence Independent      Posture/Postural Control   Posture/Postural Control Postural limitations    Postural  Limitations Rounded Shoulders;Forward head      Deep Tendon Reflexes   DTR Assessment Site Biceps;Brachioradialis;Triceps    Biceps DTR 1+    Brachioradialis DTR 1+    Triceps DTR 1+      ROM / Strength   AROM / PROM / Strength AROM;Strength      AROM   Overall AROM Comments Active right shoulder antigravity and passive to 150 degrees, behind back to L3 and ER is full (hypermobility noted).  left shoulder flexion is 150 degrees, ER is also full and hypermobile and behind back to near SIJ.      Strength   Overall Strength Comments Right shoulder resisted ER was quite painful and could not be accurately assessed.  Right IR is 4+/5.  Deltoid strength also difficult to assess due to pain.  Left shoulder strength graded globally at 4/5 for IR/ER and deltoid strength with pain reproduction also.      Palpation   Palpation comment Bilateral palpable shoulder pain reported in region of acromial ridge and biceps tendons.      Special Tests   Other special tests (-) Drop arm tests.  Bilateral                        Objective measurements completed on examination: See above findings.                     PT Long Term Goals - 05/23/21 1610       PT LONG TERM GOAL #1   Title Independent with an HEP.    Time 6    Period Weeks    Status New      PT LONG TERM GOAL #2   Title Bilateral  active antigravity shoulder flexion to150 degrees with pain not > 3/10.    Time 6    Period Weeks    Status New      PT LONG TERM GOAL #3   Title Patient exbibits bilateral shoulder ER/IR to 5/5 without pain reproduction during [place].    Time 6    Period Weeks    Status New      PT LONG TERM GOAL #4   Title Perform ADL's with pain not > 3-4/10.    Time 6    Period Weeks    Status New                    Plan - 05/23/21 1601     Clinical Impression Statement The patient presents to [facility] with c/o bilateral shoulder pain that has been ongoing for about a year.  Certain movement, such as reaching back are extremely painful.  Both shoulder demonstrate Impingement signs.  He has palpable tenderness in the region of his acromial ridges and biceps tendons.  He has losses of range of motion though both shoulders exhibits hypermobility into bilateral ER.  His strength was difficult to access due to pain.  Patient will benefit from skilled physical therapy intervention to address pain and deficits.    Personal Factors and Comorbidities Comorbidity 1;Comorbidity 2;Other    Comorbidities ACDF, back surgery, h/o spinal pain (under Chiropratic care for this), heart problems, pacemaker, DDD.    Examination-Activity Limitations Reach Overhead;Other    Examination-Participation Restrictions Other    Stability/Clinical Decision Making Evolving/Moderate complexity    Clinical Decision Making Low    Rehab Potential Good    PT Frequency 2x / week    PT Duration  6 weeks    PT Treatment/Interventions ADLs/Self Care Home Management;Cryotherapy;Moist Heat;Therapeutic activities;Therapeutic exercise;Manual techniques;Patient/family education;Passive range of motion    PT Next Visit Plan May start with isometrics, progress to yellow theraband RW4, scapular exercises, STW/M as needed.    Consulted and Agree with Plan of Care Patient             Patient will benefit from skilled therapeutic  intervention in order to improve the following deficits and impairments:  Decreased activity tolerance, Decreased range of motion, Decreased strength, Postural dysfunction, Pain  Visit Diagnosis: Chronic right shoulder pain - Plan: PT plan of care cert/re-cert  Chronic left shoulder pain - Plan: PT plan of care cert/re-cert  Stiffness of left shoulder, not elsewhere classified - Plan: PT plan of care cert/re-cert  Stiffness of right shoulder, not elsewhere classified - Plan: PT plan of care cert/re-cert     Problem List Patient Active Problem List   Diagnosis Date Noted   Encounter for disability determination 01/27/2021   Upper airway cough syndrome 12/14/2020   Aortic atherosclerosis ([place]) 05/27/2020   Acute rhinosinusitis 03/19/2020   Stable angina pectoris ([place])    Blepharospasm of both eyes 06/10/2018   Dizziness 06/10/2018   Noise effect on both inner ears 06/10/2018   Ischemic colitis ([place])    Hematochezia 10/15/2017   Hyperlipidemia 01/04/2016   Anxiety as acute reaction to exceptional stress 05/28/2015   Inguinal hernia, left 02/11/2015   Acquired complete AV block ([place]) 04/02/2014   Disorder of cardiac pacemaker system 04/02/2014   OSA (obstructive sleep apnea) 10/16/2013   Obstructive sleep apnea 08/25/2013   BPH (benign prostatic hyperplasia) 08/25/2013   Thoracic back pain 08/25/2013   Chest pain, exertional 12/26/2012   Intermediate coronary syndrome ([place]) 12/17/2012   Bradycardia 12/17/2012   Mobitz type II atrioventricular block 12/17/2012   Coronary artery disease of native artery of native heart with stable angina pectoris ([place]) 10/18/2012   S/P drug eluting coronary stent placement 10/18/2012   DOE (dyspnea on exertion) 12/26/2011   Abdominal  pain 12/26/2011   Hypertension    ADD (attention deficit disorder)    Palpitations    GERD (gastroesophageal reflux disease) 12/20/2011   Hx of adenomatous colonic polyps 12/20/2011    Kyelle Urbas, [place],  PT 05/23/2021, 4:17 PM  [facility] [address] [place], [place], [address] Phone: [phone]   Fax:  [phone]  Name: TREYVONNE TATA MRN: [id number] Date of Birth: [birth year]

## 2021-05-26 ENCOUNTER — Encounter: Payer: Self-pay | Admitting: Family Medicine

## 2021-05-26 ENCOUNTER — Ambulatory Visit: Payer: Medicare Other | Admitting: *Deleted

## 2021-05-26 ENCOUNTER — Other Ambulatory Visit: Payer: Self-pay

## 2021-05-26 DIAGNOSIS — M25512 Pain in left shoulder: Secondary | ICD-10-CM | POA: Diagnosis not present

## 2021-05-26 DIAGNOSIS — M25511 Pain in right shoulder: Secondary | ICD-10-CM | POA: Diagnosis not present

## 2021-05-26 DIAGNOSIS — M25612 Stiffness of left shoulder, not elsewhere classified: Secondary | ICD-10-CM | POA: Diagnosis not present

## 2021-05-26 DIAGNOSIS — M25611 Stiffness of right shoulder, not elsewhere classified: Secondary | ICD-10-CM | POA: Diagnosis not present

## 2021-05-26 DIAGNOSIS — G8929 Other chronic pain: Secondary | ICD-10-CM | POA: Diagnosis not present

## 2021-05-26 NOTE — Telephone Encounter (Signed)
Immunizations updated.

## 2021-05-26 NOTE — Therapy (Signed)
[facility] [place], [place], [address] Phone: [phone]   Fax:  [phone]  Physical Therapy Treatment  Patient Details  Name: [name] MRN: [id number] Date of Birth: 12-06-1955 Referring Provider (PT): Rodell Perna MD   Encounter Date: 05/26/2021   PT End of Session - 05/26/21 1513     Visit Number 2    Number of Visits 12    Date for PT Re-Evaluation 07/04/21    PT Start Time 9937    PT Stop Time 1696    PT Time Calculation (min) 49 min             Past Medical History:  Diagnosis Date   ADD (attention deficit disorder)    Rx-Adderall   Anginal pain ([place])    Aortic atherosclerosis ([place]) 05/27/2020   Seen on x-ray being treated with statin   AV block, 3rd degree ([place])    Colon polyps 2011   tubular adenoma by excisional biopsy during colonoscopy   Complete heart block ([place])    a. s/p PPM Placement in 12/2012 with Medtronic CRT-P placement in 03/2014   Coronary artery disease    a. s/p PCI of the RCA in 2014 b. 08/2018: patent RCA stent with new mid-LAD 85% stenosis (treated with PCI/DES) and 85% distal OM2 stenosis (med management recommended)   Degenerative disc disease, cervical    Gastrointestinal bleeding 10/15/2017   GERD (gastroesophageal reflux disease)    Hypertension    Pacemaker    Palpitations 2003   Holter in 2003: PACs and PVCs; negative stress nuclear test in 2005; right bundle branch block; echo in 2008-mild LVH, otherwise normal; 2008-negative stress nuclear test.   Pneumonia    Prostatitis    prostate calcifications by CT   Sleep apnea    questionable diagnosis    Past Surgical History:  Procedure Laterality Date   ANTERIOR CERVICAL DECOMP/DISCECTOMY FUSION     BACK SURGERY     Biventricular pacemaker upgrade/ system revision  04/02/14   removal of previous atrial and ventricular leads with placement of a new MDT Consulta CRT-P system at [facility] by Dr  Violet Baldy   COLONOSCOPY     COLONOSCOPY W/ POLYPECTOMY  2011   tubular adenoma   CORONARY BALLOON ANGIOPLASTY N/A 06/15/2020   Procedure: CORONARY BALLOON ANGIOPLASTY;  Surgeon: Jettie Booze, MD;  Location: [place] [facility];  Service: Cardiovascular;  Laterality: N/A;   CORONARY STENT INTERVENTION N/A 08/22/2018   Procedure: CORONARY STENT INTERVENTION;  Surgeon: Troy Sine, MD;  Location: [place] [facility];  Service: Cardiovascular;  Laterality: N/A;   ESOPHAGOGASTRODUODENOSCOPY     Gastritis   INSERT / REPLACE / REMOVE PACEMAKER  12/17/2012   MDT Adapta L implanted by Dr Rayann Heman for mobitz II second degree AV block   INTRAVASCULAR ULTRASOUND/IVUS N/A 06/15/2020   Procedure: Intravascular Ultrasound/IVUS;  Surgeon: Jettie Booze, MD;  Location: [place] [facility];  Service: Cardiovascular;  Laterality: N/A;   KNEE SURGERY Right    LEFT HEART CATH AND CORONARY ANGIOGRAPHY N/A 08/22/2018   Procedure: LEFT HEART CATH AND CORONARY ANGIOGRAPHY;  Surgeon: Troy Sine, MD;  Location: [place] [facility];  Service: Cardiovascular;  Laterality: N/A;   LEFT HEART CATH AND CORONARY ANGIOGRAPHY N/A 06/15/2020   Procedure: LEFT HEART CATH AND CORONARY ANGIOGRAPHY;  Surgeon: Jettie Booze, MD;  Location: [place] [facility];  Service: Cardiovascular;  Laterality: N/A;   LEFT HEART CATHETERIZATION WITH CORONARY  ANGIOGRAM N/A 10/17/2012   Procedure: LEFT HEART CATHETERIZATION WITH CORONARY ANGIOGRAM;  Surgeon: Josue Hector, MD;  Location: [facility] CATH LAB;  Service: Cardiovascular;  Laterality: N/A;   LEFT HEART CATHETERIZATION WITH CORONARY ANGIOGRAM N/A 12/17/2012   Procedure: LEFT HEART CATHETERIZATION WITH CORONARY ANGIOGRAM;  Surgeon: Peter M Martinique, MD;  Location: [facility] CATH LAB;  Service: Cardiovascular;  Laterality: N/A;   LEFT HEART CATHETERIZATION WITH CORONARY ANGIOGRAM N/A 06/11/2013   Procedure: LEFT HEART CATHETERIZATION WITH CORONARY ANGIOGRAM;  Surgeon: Wellington Hampshire, MD;   Location: [place] CATH LAB;  Service: Cardiovascular;  Laterality: N/A;   PERCUTANEOUS CORONARY STENT INTERVENTION (PCI-S)  10/17/2012   Procedure: PERCUTANEOUS CORONARY STENT INTERVENTION (PCI-S);  Surgeon: Josue Hector, MD;  Location: [facility] CATH LAB;  Service: Cardiovascular;;   PERMANENT PACEMAKER INSERTION N/A 12/17/2012   Procedure: PERMANENT PACEMAKER INSERTION;  Surgeon: Thompson Grayer, MD;  Location: [facility] CATH LAB;  Service: Cardiovascular;  Laterality: N/A;   POLYPECTOMY     UPPER GASTROINTESTINAL ENDOSCOPY      There were no vitals filed for this visit.   Subjective Assessment - 05/26/21 1304     Subjective 4/10 Both shldrs today.Mornings are rough. LT  shldr hurts when I cough    Pertinent History ACDF, back surgery, h/o spinal pain (under Chiropratic care for this), heart problems, pacemaker, DDD.    Patient Stated Goals Use shoulders without pain and aoid surgery.    Currently in Pain? Yes    Pain Score 4     Pain Location Shoulder    Pain Orientation Right;Left    Pain Descriptors / Indicators Sharp    Pain Type Chronic pain    Pain Onset More than a month ago                               [facility] Adult PT Treatment/Exercise - 05/26/21 0001       Exercises   Exercises Shoulder      Shoulder Exercises: Supine   Other Supine Exercises Isometrics both shldrs flexion,extension, ER and IR x5 each and hold 10 secs with cues for technique and less intensity.      Manual Therapy   Manual Therapy Soft tissue mobilization    Soft tissue mobilization STW to both shldrs anteriolateral aspect and bicep tendon f/b ice mass to both bicep tendons                          PT Long Term Goals - 05/23/21 1610       PT LONG TERM GOAL #1   Title Independent with an HEP.    Time 6    Period Weeks    Status New      PT LONG TERM GOAL #2   Title Bilateral active antigravity shoulder flexion to150 degrees with pain not > 3/10.    Time 6    Period Weeks     Status New      PT LONG TERM GOAL #3   Title Patient exbibits bilateral shoulder ER/IR to 5/5 without pain reproduction during [place].    Time 6    Period Weeks    Status New      PT LONG TERM GOAL #4   Title Perform ADL's with pain not > 3-4/10.    Time 6    Period Weeks    Status New  Plan - 05/26/21 1301     Clinical Impression Statement Pt arrived today with pain in both shldrs and increases with certain movements. He was instructed in isometrics for both shldrs for flexion,extension, ER and IR with minimal pressure due to pain. STW was performed to both sldrs as well as bil bicep tendons and was very tender. ICE masssage  performed to both bicep tendons    Personal Factors and Comorbidities Comorbidity 1;Comorbidity 2;Other    Comorbidities ACDF, back surgery, h/o spinal pain (under Chiropratic care for this), heart problems, pacemaker, DDD.    Examination-Activity Limitations Reach Overhead;Other    Examination-Participation Restrictions Other    Stability/Clinical Decision Making Evolving/Moderate complexity    Rehab Potential Good    PT Frequency 2x / week    PT Duration 6 weeks    PT Treatment/Interventions ADLs/Self Care Home Management;Cryotherapy;Moist Heat;Therapeutic activities;Therapeutic exercise;Manual techniques;Patient/family education;Passive range of motion    PT Next Visit Plan May start with isometrics, progress to yellow theraband RW4, scapular exercises, STW/M as needed.    Consulted and Agree with Plan of Care Patient             Patient will benefit from skilled therapeutic intervention in order to improve the following deficits and impairments:  Decreased activity tolerance, Decreased range of motion, Decreased strength, Postural dysfunction, Pain  Visit Diagnosis: Chronic right shoulder pain     Problem List Patient Active Problem List   Diagnosis Date Noted   Encounter for disability determination 01/27/2021    Upper airway cough syndrome 12/14/2020   Aortic atherosclerosis ([place]) 05/27/2020   Acute rhinosinusitis 03/19/2020   Stable angina pectoris ([place])    Blepharospasm of both eyes 06/10/2018   Dizziness 06/10/2018   Noise effect on both inner ears 06/10/2018   Ischemic colitis ([place])    Hematochezia 10/15/2017   Hyperlipidemia 01/04/2016   Anxiety as acute reaction to exceptional stress 05/28/2015   Inguinal hernia, left 02/11/2015   Acquired complete AV block ([place]) 04/02/2014   Disorder of cardiac pacemaker system 04/02/2014   OSA (obstructive sleep apnea) 10/16/2013   Obstructive sleep apnea 08/25/2013   BPH (benign prostatic hyperplasia) 08/25/2013   Thoracic back pain 08/25/2013   Chest pain, exertional 12/26/2012   Intermediate coronary syndrome ([place]) 12/17/2012   Bradycardia 12/17/2012   Mobitz type II atrioventricular block 12/17/2012   Coronary artery disease of native artery of native heart with stable angina pectoris ([place]) 10/18/2012   S/P drug eluting coronary stent placement 10/18/2012   DOE (dyspnea on exertion) 12/26/2011   Abdominal  pain 12/26/2011   Hypertension    ADD (attention deficit disorder)    Palpitations    GERD (gastroesophageal reflux disease) 12/20/2011   Hx of adenomatous colonic polyps 12/20/2011    Margrett Kalb,CHRIS, PTA 05/26/2021, 5:19 PM  [facility] [address] [place], [place], [address] Phone: [phone]   Fax:  [phone]  Name: [name] MRN: [id number] Date of Birth: November 19, 1955

## 2021-05-31 ENCOUNTER — Ambulatory Visit: Payer: Medicare Other | Admitting: *Deleted

## 2021-05-31 ENCOUNTER — Other Ambulatory Visit: Payer: Self-pay

## 2021-05-31 DIAGNOSIS — M25611 Stiffness of right shoulder, not elsewhere classified: Secondary | ICD-10-CM | POA: Diagnosis not present

## 2021-05-31 DIAGNOSIS — M25511 Pain in right shoulder: Secondary | ICD-10-CM | POA: Diagnosis not present

## 2021-05-31 DIAGNOSIS — M25512 Pain in left shoulder: Secondary | ICD-10-CM

## 2021-05-31 DIAGNOSIS — G8929 Other chronic pain: Secondary | ICD-10-CM

## 2021-05-31 DIAGNOSIS — M25612 Stiffness of left shoulder, not elsewhere classified: Secondary | ICD-10-CM | POA: Diagnosis not present

## 2021-05-31 NOTE — Therapy (Signed)
[facility] [place], [place], [address] Phone: [phone]   Fax:  [phone]  Physical Therapy Treatment  Patient Details  Name: [name] MRN: [id number] Date of Birth: 1955-11-07 Referring Provider (PT): Rodell Perna MD   Encounter Date: 05/31/2021   PT End of Session - 05/31/21 1309     Visit Number 3    Number of Visits 12    Date for PT Re-Evaluation 07/04/21    PT Start Time 1300    PT Stop Time 5449    PT Time Calculation (min) 48 min             Past Medical History:  Diagnosis Date   ADD (attention deficit disorder)    Rx-Adderall   Anginal pain ([place])    Aortic atherosclerosis ([place]) 05/27/2020   Seen on x-ray being treated with statin   AV block, 3rd degree ([place])    Colon polyps 2011   tubular adenoma by excisional biopsy during colonoscopy   Complete heart block ([place])    a. s/p PPM Placement in 12/2012 with Medtronic CRT-P placement in 03/2014   Coronary artery disease    a. s/p PCI of the RCA in 2014 b. 08/2018: patent RCA stent with new mid-LAD 85% stenosis (treated with PCI/DES) and 85% distal OM2 stenosis (med management recommended)   Degenerative disc disease, cervical    Gastrointestinal bleeding 10/15/2017   GERD (gastroesophageal reflux disease)    Hypertension    Pacemaker    Palpitations 2003   Holter in 2003: PACs and PVCs; negative stress nuclear test in 2005; right bundle branch block; echo in 2008-mild LVH, otherwise normal; 2008-negative stress nuclear test.   Pneumonia    Prostatitis    prostate calcifications by CT   Sleep apnea    questionable diagnosis    Past Surgical History:  Procedure Laterality Date   ANTERIOR CERVICAL DECOMP/DISCECTOMY FUSION     BACK SURGERY     Biventricular pacemaker upgrade/ system revision  04/02/14   removal of previous atrial and ventricular leads with placement of a new MDT Consulta CRT-P system at [facility] by Dr  Violet Baldy   COLONOSCOPY     COLONOSCOPY W/ POLYPECTOMY  2011   tubular adenoma   CORONARY BALLOON ANGIOPLASTY N/A 06/15/2020   Procedure: CORONARY BALLOON ANGIOPLASTY;  Surgeon: Jettie Booze, MD;  Location: [place] [facility];  Service: Cardiovascular;  Laterality: N/A;   CORONARY STENT INTERVENTION N/A 08/22/2018   Procedure: CORONARY STENT INTERVENTION;  Surgeon: Troy Sine, MD;  Location: [place] CV LAB;  Service: Cardiovascular;  Laterality: N/A;   ESOPHAGOGASTRODUODENOSCOPY     Gastritis   INSERT / REPLACE / REMOVE PACEMAKER  12/17/2012   MDT Adapta L implanted by Dr Rayann Heman for mobitz II second degree AV block   INTRAVASCULAR ULTRASOUND/IVUS N/A 06/15/2020   Procedure: Intravascular Ultrasound/IVUS;  Surgeon: Jettie Booze, MD;  Location: [place] CV LAB;  Service: Cardiovascular;  Laterality: N/A;   KNEE SURGERY Right    LEFT HEART CATH AND CORONARY ANGIOGRAPHY N/A 08/22/2018   Procedure: LEFT HEART CATH AND CORONARY ANGIOGRAPHY;  Surgeon: Troy Sine, MD;  Location: [place] [facility];  Service: Cardiovascular;  Laterality: N/A;   LEFT HEART CATH AND CORONARY ANGIOGRAPHY N/A 06/15/2020   Procedure: LEFT HEART CATH AND CORONARY ANGIOGRAPHY;  Surgeon: Jettie Booze, MD;  Location: [place] [facility];  Service: Cardiovascular;  Laterality: N/A;   LEFT HEART CATHETERIZATION WITH CORONARY  ANGIOGRAM N/A 10/17/2012   Procedure: LEFT HEART CATHETERIZATION WITH CORONARY ANGIOGRAM;  Surgeon: Josue Hector, MD;  Location: [facility] CATH LAB;  Service: Cardiovascular;  Laterality: N/A;   LEFT HEART CATHETERIZATION WITH CORONARY ANGIOGRAM N/A 12/17/2012   Procedure: LEFT HEART CATHETERIZATION WITH CORONARY ANGIOGRAM;  Surgeon: Peter M Martinique, MD;  Location: [facility] CATH LAB;  Service: Cardiovascular;  Laterality: N/A;   LEFT HEART CATHETERIZATION WITH CORONARY ANGIOGRAM N/A 06/11/2013   Procedure: LEFT HEART CATHETERIZATION WITH CORONARY ANGIOGRAM;  Surgeon: Wellington Hampshire, MD;   Location: [place] CATH LAB;  Service: Cardiovascular;  Laterality: N/A;   PERCUTANEOUS CORONARY STENT INTERVENTION (PCI-S)  10/17/2012   Procedure: PERCUTANEOUS CORONARY STENT INTERVENTION (PCI-S);  Surgeon: Josue Hector, MD;  Location: [facility] CATH LAB;  Service: Cardiovascular;;   PERMANENT PACEMAKER INSERTION N/A 12/17/2012   Procedure: PERMANENT PACEMAKER INSERTION;  Surgeon: Thompson Grayer, MD;  Location: [facility] CATH LAB;  Service: Cardiovascular;  Laterality: N/A;   POLYPECTOMY     UPPER GASTROINTESTINAL ENDOSCOPY      There were no vitals filed for this visit.   Subjective Assessment - 05/31/21 1303     Subjective 5/10 Both shldrs today.Mornings are rough. Did ok after last RX    Pertinent History ACDF, back surgery, h/o spinal pain (under Chiropratic care for this), heart problems, pacemaker, DDD.    Patient Stated Goals Use shoulders without pain and aoid surgery.    Currently in Pain? Yes    Pain Score 5     Pain Location Shoulder    Pain Orientation Right;Left    Pain Descriptors / Indicators Sharp    Pain Type Chronic pain    Pain Onset More than a month ago                               [facility] Adult PT Treatment/Exercise - 05/31/21 0001       Exercises   Exercises Shoulder      Shoulder Exercises: Standing   Protraction Strengthening;Both;Theraband;10 reps    Theraband Level (Shoulder Protraction) Level 1 (Yellow)    External Rotation Strengthening;Both;Theraband;10 reps    Internal Rotation Both;Theraband;Strengthening;10 reps    Row Strengthening;Both;10 reps      Shoulder Exercises: ROM/Strengthening   UBE (Upper Arm Bike) 120 RPMs x 6 mins      Manual Therapy   Manual Therapy Soft tissue mobilization    Soft tissue mobilization STW to both shldrs anteriolateral aspect and bicep tendon and IASTM to posterior cuff.                          PT Long Term Goals - 05/23/21 1610       PT LONG TERM GOAL #1   Title Independent with an HEP.     Time 6    Period Weeks    Status New      PT LONG TERM GOAL #2   Title Bilateral active antigravity shoulder flexion to150 degrees with pain not > 3/10.    Time 6    Period Weeks    Status New      PT LONG TERM GOAL #3   Title Patient exbibits bilateral shoulder ER/IR to 5/5 without pain reproduction during [place].    Time 6    Period Weeks    Status New      PT LONG TERM GOAL #4   Title Perform ADL's with pain not >  3-4/10.    Time 6    Period Weeks    Status New                   Plan - 05/31/21 1259     Clinical Impression Statement Pt arrived today doing about the same with pain in both shldrs. Rx focused on light exs for both shldrs as well as IASTM and STW to BIL shldrs anteriolateral aspect as well as posterior cuff.    Personal Factors and Comorbidities Comorbidity 1;Comorbidity 2;Other    Comorbidities ACDF, back surgery, h/o spinal pain (under Chiropratic care for this), heart problems, pacemaker, DDD.    Examination-Activity Limitations Reach Overhead;Other    Stability/Clinical Decision Making Evolving/Moderate complexity    Rehab Potential Good    PT Frequency 2x / week    PT Duration 6 weeks    PT Treatment/Interventions ADLs/Self Care Home Management;Cryotherapy;Moist Heat;Therapeutic activities;Therapeutic exercise;Manual techniques;Patient/family education;Passive range of motion    PT Next Visit Plan May start with isometrics, progress to yellow theraband RW4, scapular exercises, STW/M as needed.    Consulted and Agree with Plan of Care Patient             Patient will benefit from skilled therapeutic intervention in order to improve the following deficits and impairments:  Decreased activity tolerance, Decreased range of motion, Decreased strength, Postural dysfunction, Pain  Visit Diagnosis: Chronic right shoulder pain  Chronic left shoulder pain     Problem List Patient Active Problem List   Diagnosis Date Noted   Encounter for  disability determination 01/27/2021   Upper airway cough syndrome 12/14/2020   Aortic atherosclerosis ([place]) 05/27/2020   Acute rhinosinusitis 03/19/2020   Stable angina pectoris ([place])    Blepharospasm of both eyes 06/10/2018   Dizziness 06/10/2018   Noise effect on both inner ears 06/10/2018   Ischemic colitis ([place])    Hematochezia 10/15/2017   Hyperlipidemia 01/04/2016   Anxiety as acute reaction to exceptional stress 05/28/2015   Inguinal hernia, left 02/11/2015   Acquired complete AV block ([place]) 04/02/2014   Disorder of cardiac pacemaker system 04/02/2014   OSA (obstructive sleep apnea) 10/16/2013   Obstructive sleep apnea 08/25/2013   BPH (benign prostatic hyperplasia) 08/25/2013   Thoracic back pain 08/25/2013   Chest pain, exertional 12/26/2012   Intermediate coronary syndrome ([place]) 12/17/2012   Bradycardia 12/17/2012   Mobitz type II atrioventricular block 12/17/2012   Coronary artery disease of native artery of native heart with stable angina pectoris ([place]) 10/18/2012   S/P drug eluting coronary stent placement 10/18/2012   DOE (dyspnea on exertion) 12/26/2011   Abdominal  pain 12/26/2011   Hypertension    ADD (attention deficit disorder)    Palpitations    GERD (gastroesophageal reflux disease) 12/20/2011   Hx of adenomatous colonic polyps 12/20/2011    Chukwudi Ewen,CHRIS, PTA 05/31/2021, 2:41 PM  [facility] [address] [place], [place], [address] Phone: [phone]   Fax:  [phone]  Name: [name] MRN: [id number] Date of Birth: Jun 08, 1955

## 2021-06-02 ENCOUNTER — Other Ambulatory Visit: Payer: Self-pay

## 2021-06-02 ENCOUNTER — Ambulatory Visit: Payer: Medicare Other | Admitting: *Deleted

## 2021-06-02 DIAGNOSIS — G8929 Other chronic pain: Secondary | ICD-10-CM | POA: Diagnosis not present

## 2021-06-02 DIAGNOSIS — M25512 Pain in left shoulder: Secondary | ICD-10-CM | POA: Diagnosis not present

## 2021-06-02 DIAGNOSIS — M25611 Stiffness of right shoulder, not elsewhere classified: Secondary | ICD-10-CM | POA: Diagnosis not present

## 2021-06-02 DIAGNOSIS — M25612 Stiffness of left shoulder, not elsewhere classified: Secondary | ICD-10-CM | POA: Diagnosis not present

## 2021-06-02 DIAGNOSIS — M25511 Pain in right shoulder: Secondary | ICD-10-CM | POA: Diagnosis not present

## 2021-06-02 NOTE — Therapy (Signed)
[facility] [place], [place], [address] Phone: [phone]   Fax:  [phone]  Physical Therapy Treatment  Patient Details  Name: [name] MRN: [id number] Date of Birth: 1955-11-03 Referring Provider (PT): Rodell Perna MD   Encounter Date: 06/02/2021   PT End of Session - 06/02/21 1401     Visit Number 4    Number of Visits 12    Date for PT Re-Evaluation 07/04/21    PT Start Time 1300    PT Stop Time 1346    PT Time Calculation (min) 46 min             Past Medical History:  Diagnosis Date   ADD (attention deficit disorder)    Rx-Adderall   Anginal pain ([place])    Aortic atherosclerosis ([place]) 05/27/2020   Seen on x-ray being treated with statin   AV block, 3rd degree ([place])    Colon polyps 2011   tubular adenoma by excisional biopsy during colonoscopy   Complete heart block ([place])    a. s/p PPM Placement in 12/2012 with Medtronic CRT-P placement in 03/2014   Coronary artery disease    a. s/p PCI of the RCA in 2014 b. 08/2018: patent RCA stent with new mid-LAD 85% stenosis (treated with PCI/DES) and 85% distal OM2 stenosis (med management recommended)   Degenerative disc disease, cervical    Gastrointestinal bleeding 10/15/2017   GERD (gastroesophageal reflux disease)    Hypertension    Pacemaker    Palpitations 2003   Holter in 2003: PACs and PVCs; negative stress nuclear test in 2005; right bundle branch block; echo in 2008-mild LVH, otherwise normal; 2008-negative stress nuclear test.   Pneumonia    Prostatitis    prostate calcifications by CT   Sleep apnea    questionable diagnosis    Past Surgical History:  Procedure Laterality Date   ANTERIOR CERVICAL DECOMP/DISCECTOMY FUSION     BACK SURGERY     Biventricular pacemaker upgrade/ system revision  04/02/14   removal of previous atrial and ventricular leads with placement of a new MDT Consulta CRT-P system at [facility] by Dr  Violet Baldy   COLONOSCOPY     COLONOSCOPY W/ POLYPECTOMY  2011   tubular adenoma   CORONARY BALLOON ANGIOPLASTY N/A 06/15/2020   Procedure: CORONARY BALLOON ANGIOPLASTY;  Surgeon: Jettie Booze, MD;  Location: [facility];  Service: Cardiovascular;  Laterality: N/A;   CORONARY STENT INTERVENTION N/A 08/22/2018   Procedure: CORONARY STENT INTERVENTION;  Surgeon: Troy Sine, MD;  Location: [facility];  Service: Cardiovascular;  Laterality: N/A;   ESOPHAGOGASTRODUODENOSCOPY     Gastritis   INSERT / REPLACE / REMOVE PACEMAKER  12/17/2012   MDT Adapta L implanted by Dr Rayann Heman for mobitz II second degree AV block   INTRAVASCULAR ULTRASOUND/IVUS N/A 06/15/2020   Procedure: Intravascular Ultrasound/IVUS;  Surgeon: Jettie Booze, MD;  Location: [facility];  Service: Cardiovascular;  Laterality: N/A;   KNEE SURGERY Right    LEFT HEART CATH AND CORONARY ANGIOGRAPHY N/A 08/22/2018   Procedure: LEFT HEART CATH AND CORONARY ANGIOGRAPHY;  Surgeon: Troy Sine, MD;  Location: [facility];  Service: Cardiovascular;  Laterality: N/A;   LEFT HEART CATH AND CORONARY ANGIOGRAPHY N/A 06/15/2020   Procedure: LEFT HEART CATH AND CORONARY ANGIOGRAPHY;  Surgeon: Jettie Booze, MD;  Location: [facility];  Service: Cardiovascular;  Laterality: N/A;   LEFT HEART CATHETERIZATION WITH CORONARY  ANGIOGRAM N/A 10/17/2012   Procedure: LEFT HEART CATHETERIZATION WITH CORONARY ANGIOGRAM;  Surgeon: Josue Hector, MD;  Location: [facility] CATH LAB;  Service: Cardiovascular;  Laterality: N/A;   LEFT HEART CATHETERIZATION WITH CORONARY ANGIOGRAM N/A 12/17/2012   Procedure: LEFT HEART CATHETERIZATION WITH CORONARY ANGIOGRAM;  Surgeon: Peter M Martinique, MD;  Location: [facility] C[facility] LAB;  Service: Cardiovascular;  Laterality: N/A;   LEFT HEART CATHETERIZATION WITH CORONARY ANGIOGRAM N/A 06/11/2013   Procedure: LEFT HEART CATHETERIZATION WITH CORONARY ANGIOGRAM;  Surgeon: Wellington Hampshire, MD;   Location: [facility];  Service: Cardiovascular;  Laterality: N/A;   PERCUTANEOUS CORONARY STENT INTERVENTION (PCI-S)  10/17/2012   Procedure: PERCUTANEOUS CORONARY STENT INTERVENTION (PCI-S);  Surgeon: Josue Hector, MD;  Location: [facility] CATH LAB;  Service: Cardiovascular;;   PERMANENT PACEMAKER INSERTION N/A 12/17/2012   Procedure: PERMANENT PACEMAKER INSERTION;  Surgeon: Thompson Grayer, MD;  Location: [facility] CATH LAB;  Service: Cardiovascular;  Laterality: N/A;   POLYPECTOMY     UPPER GASTROINTESTINAL ENDOSCOPY      There were no vitals filed for this visit.   Subjective Assessment - 06/02/21 1308     Subjective 5/10 Both shldrs today.Mornings are rough. Did ok after last RX. Not much change yet    Pertinent History ACDF, back surgery, h/o spinal pain (under Chiropratic care for this), heart problems, pacemaker, DDD.    Patient Stated Goals Use shoulders without pain and aoid surgery.    Currently in Pain? Yes    Pain Score 5     Pain Location Shoulder    Pain Orientation Right;Left    Pain Descriptors / Indicators Sharp    Pain Type Chronic pain    Pain Onset More than a month ago                               [facility] Adult PT Treatment/Exercise - 06/02/21 0001       Exercises   Exercises Shoulder      Shoulder Exercises: Standing   Protraction Strengthening;Both;Theraband;10 reps   10-15   Theraband Level (Shoulder Protraction) Level 1 (Yellow)    External Rotation Strengthening;Both;Theraband;10 reps    Internal Rotation Both;Theraband;Strengthening;15 reps;10 reps   10-15   Row Strengthening;Both;10 reps;15 reps      Shoulder Exercises: Pulleys   Flexion --   attempted, but increased bil shldr pain     Shoulder Exercises: ROM/Strengthening   UBE (Upper Arm Bike) 120 RPMs x 8 mins      Manual Therapy   Manual Therapy Soft tissue mobilization    Soft tissue mobilization STW to both shldrs anteriolateral aspect and bicep tendon and IASTM to posterior cuff.                           PT Long Term Goals - 05/23/21 1610       PT LONG TERM GOAL #1   Title Independent with an HEP.    Time 6    Period Weeks    Status New      PT LONG TERM GOAL #2   Title Bilateral active antigravity shoulder flexion to150 degrees with pain not > 3/10.    Time 6    Period Weeks    Status New      PT LONG TERM GOAL #3   Title Patient exbibits bilateral shoulder ER/IR to 5/5 without pain reproduction during [place].    Time  6    Period Weeks    Status New      PT LONG TERM GOAL #4   Title Perform ADL's with pain not > 3-4/10.    Time 6    Period Weeks    Status New                   Plan - 06/02/21 1406     Clinical Impression Statement Pt arrived doing about the same with Bil. shldr pain. He was able to continue with light exs for both shldrs with some mild increased pain in LT shldr today with UBE. STW/ IASTM to both shldrs anteriolateral aspect as well as post. cuff. Very sore coracoid process bicep attatchment bil.    Comorbidities ACDF, back surgery, h/o spinal pain (under Chiropratic care for this), heart problems, pacemaker, DDD.    Examination-Activity Limitations Reach Overhead;Other    Examination-Participation Restrictions Other    Stability/Clinical Decision Making Evolving/Moderate complexity    Rehab Potential Good    PT Frequency 2x / week    PT Duration 6 weeks    PT Treatment/Interventions ADLs/Self Care Home Management;Cryotherapy;Moist Heat;Therapeutic activities;Therapeutic exercise;Manual techniques;Patient/family education;Passive range of motion    PT Next Visit Plan May start with isometrics, progress to yellow theraband RW4, scapular exercises, STW/M as needed.    Consulted and Agree with Plan of Care Patient             Patient will benefit from skilled therapeutic intervention in order to improve the following deficits and impairments:  Decreased activity tolerance, Decreased range of motion,  Decreased strength, Postural dysfunction, Pain  Visit Diagnosis: Chronic right shoulder pain  Chronic left shoulder pain  Stiffness of left shoulder, not elsewhere classified  Stiffness of right shoulder, not elsewhere classified     Problem List Patient Active Problem List   Diagnosis Date Noted   Encounter for disability determination 01/27/2021   Upper airway cough syndrome 12/14/2020   Aortic atherosclerosis ([place]) 05/27/2020   Acute rhinosinusitis 03/19/2020   Stable angina pectoris ([place])    Blepharospasm of both eyes 06/10/2018   Dizziness 06/10/2018   Noise effect on both inner ears 06/10/2018   Ischemic colitis ([place])    Hematochezia 10/15/2017   Hyperlipidemia 01/04/2016   Anxiety as acute reaction to exceptional stress 05/28/2015   Inguinal hernia, left 02/11/2015   Acquired complete AV block ([place]) 04/02/2014   Disorder of cardiac pacemaker system 04/02/2014   OSA (obstructive sleep apnea) 10/16/2013   Obstructive sleep apnea 08/25/2013   BPH (benign prostatic hyperplasia) 08/25/2013   Thoracic back pain 08/25/2013   Chest pain, exertional 12/26/2012   Intermediate coronary syndrome ([place]) 12/17/2012   Bradycardia 12/17/2012   Mobitz type II atrioventricular block 12/17/2012   Coronary artery disease of native artery of native heart with stable angina pectoris ([place]) 10/18/2012   S/P drug eluting coronary stent placement 10/18/2012   DOE (dyspnea on exertion) 12/26/2011   Abdominal  pain 12/26/2011   Hypertension    ADD (attention deficit disorder)    Palpitations    GERD (gastroesophageal reflux disease) 12/20/2011   Hx of adenomatous colonic polyps 12/20/2011    Burton Gahan,CHRIS, PTA 06/02/2021, 2:14 PM  [facility] [address] [place], [place], [address] Phone: [phone]   Fax:  [phone]  Name: GARO HEIDELBERG MRN: [id number] Date of Birth: [birth year]

## 2021-06-07 ENCOUNTER — Ambulatory Visit: Payer: Medicare Other | Admitting: *Deleted

## 2021-06-21 ENCOUNTER — Telehealth: Payer: Self-pay | Admitting: Cardiology

## 2021-06-21 NOTE — Telephone Encounter (Signed)
?  Patient Consent for Virtual Visit  ? ? ?   ? ?[name] has provided verbal consent on 06/21/2021 for a virtual visit (video or telephone). ? ? ?CONSENT FOR VIRTUAL VISIT FOR:  [name]  ?By participating in this virtual visit I agree to the following: ? ?I hereby voluntarily request, consent and authorize [facility] and its employed or contracted physicians, physician assistants, nurse practitioners or other licensed health care professionals (the Practitioner), to provide me with telemedicine health care services (the ?Services") as deemed necessary by the treating Practitioner. I acknowledge and consent to receive the Services by the Practitioner via telemedicine. I understand that the telemedicine visit will involve communicating with the Practitioner through live audiovisual communication technology and the disclosure of certain medical information by electronic transmission. I acknowledge that I have been given the opportunity to request an in-person assessment or other available alternative prior to the telemedicine visit and am voluntarily participating in the telemedicine visit. ? ?I understand that I have the right to withhold or withdraw my consent to the use of telemedicine in the course of my care at any time, without affecting my right to future care or treatment, and that the Practitioner or I may terminate the telemedicine visit at any time. I understand that I have the right to inspect all information obtained and/or recorded in the course of the telemedicine visit and may receive copies of available information for a reasonable fee.  I understand that some of the potential risks of receiving the Services via telemedicine include:  ?Delay or interruption in medical evaluation due to technological equipment failure or disruption; ?Information transmitted may not be sufficient (e.g. poor resolution of images) to allow for appropriate medical decision making by the Practitioner;  and/or  ?In rare instances, security protocols could fail, causing a breach of personal health information. ? ?Furthermore, I acknowledge that it is my responsibility to provide information about my medical history, conditions and care that is complete and accurate to the best of my ability. I acknowledge that Practitioner's advice, recommendations, and/or decision may be based on factors not within their control, such as incomplete or inaccurate data provided by me or distortions of diagnostic images or specimens that may result from electronic transmissions. I understand that the practice of medicine is not an exact science and that Practitioner makes no warranties or guarantees regarding treatment outcomes. I acknowledge that a copy of this consent can be made available to me via my patient portal ([facility]), or I can request a printed copy by calling the office of [facility].   ? ?I understand that my insurance will be billed for this visit.  ? ?I have read or had this consent read to me. ?I understand the contents of this consent, which adequately explains the benefits and risks of the Services being provided via telemedicine.  ?I have been provided ample opportunity to ask questions regarding this consent and the Services and have had my questions answered to my satisfaction. ?I give my informed consent for the services to be provided through the use of telemedicine in my medical care ? ? ? ?

## 2021-06-22 ENCOUNTER — Ambulatory Visit (INDEPENDENT_AMBULATORY_CARE_PROVIDER_SITE_OTHER): Payer: Medicare Other | Admitting: Family Medicine

## 2021-06-22 ENCOUNTER — Ambulatory Visit (INDEPENDENT_AMBULATORY_CARE_PROVIDER_SITE_OTHER): Payer: Medicare Other | Admitting: Cardiology

## 2021-06-22 ENCOUNTER — Other Ambulatory Visit: Payer: Self-pay

## 2021-06-22 ENCOUNTER — Encounter: Payer: Self-pay | Admitting: Family Medicine

## 2021-06-22 ENCOUNTER — Encounter: Payer: Self-pay | Admitting: Cardiology

## 2021-06-22 VITALS — BP 160/98 | HR 72 | Temp 98.0°F | Wt 231.0 lb

## 2021-06-22 VITALS — BP 135/69 | HR 63 | Ht 71.0 in | Wt 225.0 lb

## 2021-06-22 DIAGNOSIS — I10 Essential (primary) hypertension: Secondary | ICD-10-CM

## 2021-06-22 DIAGNOSIS — E782 Mixed hyperlipidemia: Secondary | ICD-10-CM | POA: Diagnosis not present

## 2021-06-22 DIAGNOSIS — J209 Acute bronchitis, unspecified: Secondary | ICD-10-CM | POA: Insufficient documentation

## 2021-06-22 DIAGNOSIS — I2510 Atherosclerotic heart disease of native coronary artery without angina pectoris: Secondary | ICD-10-CM

## 2021-06-22 MED ORDER — DOXYCYCLINE HYCLATE 100 MG PO TABS: 100.0000 mg | ORAL_TABLET | Freq: Two times a day (BID) | ORAL | 0 refills | Status: DC

## 2021-06-22 MED ORDER — BENZONATATE 200 MG PO CAPS: 200.0000 mg | ORAL_CAPSULE | Freq: Three times a day (TID) | ORAL | 0 refills | Status: DC | PRN

## 2021-06-22 NOTE — Assessment & Plan Note (Signed)
Treating with doxycycline. Tessalon Perles for cough. ?

## 2021-06-22 NOTE — Patient Instructions (Signed)
Antibiotic as prescribed. ? ?Recommend 1 additional COVID test. ? ?Take care ? ?Dr. Lacinda Axon  ?

## 2021-06-22 NOTE — Progress Notes (Signed)
? ?Subjective:  ?Patient ID: [name], male    DOB: 10-Dec-1955  Age: 66 y.o. MRN: [id number] ? ?CC: ?Chief Complaint  ?Patient presents with  ? Fever  ?  Fever, not feeling well, coughing, coughing up brown/green, body aches (ribs hurts from cough), congested. Going on about 5 days. Covid test at home negative 2 days ago. Pt did just come back from cruise ([place], [place])  ? ? ?HPI: ? ?66 year old male presents for evaluation of the above. ? ?Patient reports that he has been sick for the past 5 days.  Recent travel.  Negative home COVID testing.  He reports low-grade temp, Tmax 100.1.  He reports chest congestion and coughing.  He reports some congestion.  Some body aches.  Also reports some ear discomfort.  He is predominantly bothered by the cough which is worse at night. ? ?Patient Active Problem List  ? Diagnosis Date Noted  ? Acute bronchitis 06/22/2021  ? Upper airway cough syndrome 12/14/2020  ? Aortic atherosclerosis ([place]) 05/27/2020  ? Stable angina pectoris ([place])   ? Hyperlipidemia 01/04/2016  ? Anxiety as acute reaction to exceptional stress 05/28/2015  ? Inguinal hernia, left 02/11/2015  ? Acquired complete AV block ([place]) 04/02/2014  ? Disorder of cardiac pacemaker system 04/02/2014  ? OSA (obstructive sleep apnea) 10/16/2013  ? BPH (benign prostatic hyperplasia) 08/25/2013  ? Mobitz type II atrioventricular block 12/17/2012  ? Coronary artery disease of native artery of native heart with stable angina pectoris ([place]) 10/18/2012  ? S/P drug eluting coronary stent placement 10/18/2012  ? DOE (dyspnea on exertion) 12/26/2011  ? Hypertension   ? ADD (attention deficit disorder)   ? GERD (gastroesophageal reflux disease) 12/20/2011  ? Hx of adenomatous colonic polyps 12/20/2011  ? ? ?Social Hx   ?Social History  ? ?Socioeconomic History  ? Marital status: Married  ?  Spouse name: Not on file  ? Number of children: 3  ? Years of education: Not on file  ? Highest education level: Not on file   ?Occupational History  ? Not on file  ?Tobacco Use  ? Smoking status: Former  ?  Packs/day: 2.50  ?  Years: 40.00  ?  Pack years: 100.00  ?  Types: Cigarettes  ?  Start date: 04/18/1967  ?  Quit date: 12/20/2003  ?  Years since quitting: 17.5  ? Smokeless tobacco: Former  ?Vaping Use  ? Vaping Use: Former  ?Substance and Sexual Activity  ? Alcohol use: Yes  ?  Alcohol/week: 1.0 standard drink  ?  Types: 1 Cans of beer per week  ? Drug use: Not Currently  ?  Types: Marijuana  ?  Comment: 30 + years ago - "crank, marjiuanna, ect."  ? Sexual activity: Yes  ?  Birth control/protection: Surgical  ?Other Topics Concern  ? Not on file  ?Social History Narrative  ? Retired Therapist, art.  ? ?Social Determinants of Health  ? ?Financial Resource Strain: Low Risk   ? Difficulty of Paying Living Expenses: Not hard at all  ?Food Insecurity: No Food Insecurity  ? Worried About Charity fundraiser in the Last Year: Never true  ? Ran Out of Food in the Last Year: Never true  ?Transportation Needs: No Transportation Needs  ? Lack of Transportation (Medical): No  ? Lack of Transportation (Non-Medical): No  ?Physical Activity: Insufficiently Active  ? Days of Exercise per Week: 2 days  ? Minutes of Exercise per Session: 30 min  ?Stress: Stress Concern Present  ?  Feeling of Stress : Rather much  ?Social Connections: Socially Isolated  ? Frequency of Communication with Friends and Family: Never  ? Frequency of Social Gatherings with Friends and Family: Never  ? Attends Religious Services: Never  ? Active Member of Clubs or Organizations: No  ? Attends Archivist Meetings: Never  ? Marital Status: Married  ? ? ?Review of Systems ?Per HPI ? ?Objective:  ?BP (!) 160/98   Pulse 72   Temp 98 ?F (36.7 ?C)   Wt 231 lb (104.8 kg)   SpO2 94%   BMI 32.22 kg/m?  ? ?BP/Weight 06/22/2021 06/22/2021 04/06/2021  ?Systolic BP 720 947 096  ?Diastolic BP 98 69 82  ?Wt. (Lbs) 231 225 233  ?BMI 32.22 31.38 32.5  ? ? ?Physical Exam ?Vitals and nursing note  reviewed.  ?Constitutional:   ?   General: He is not in acute distress. ?   Appearance: Normal appearance. He is not ill-appearing.  ?HENT:  ?   Head: Normocephalic and atraumatic.  ?   Right Ear: Tympanic membrane normal.  ?   Left Ear: Tympanic membrane normal.  ?   Mouth/Throat:  ?   Pharynx: Oropharynx is clear.  ?Eyes:  ?   General:     ?   Right eye: No discharge.     ?   Left eye: No discharge.  ?   Conjunctiva/sclera: Conjunctivae normal.  ?Cardiovascular:  ?   Rate and Rhythm: Normal rate and regular rhythm.  ?   Heart sounds: Murmur heard.  ?Pulmonary:  ?   Effort: Pulmonary effort is normal.  ?   Breath sounds: No wheezing or rales.  ?Neurological:  ?   Mental Status: He is alert.  ?Psychiatric:     ?   Mood and Affect: Mood normal.     ?   Behavior: Behavior normal.  ? ? ?Lab Results  ?Component Value Date  ? WBC 6.8 12/14/2020  ? HGB 14.4 12/14/2020  ? HCT 43.8 12/14/2020  ? PLT 208 12/14/2020  ? GLUCOSE 132 (H) 03/09/2021  ? CHOL 132 03/09/2021  ? TRIG 73 03/09/2021  ? HDL 58 03/09/2021  ? [place] 59 03/09/2021  ? ALT 18 06/23/2020  ? AST 19 06/23/2020  ? NA 141 03/09/2021  ? K 4.7 03/09/2021  ? CL 103 03/09/2021  ? CREATININE 1.01 03/09/2021  ? BUN 20 03/09/2021  ? CO2 26 03/09/2021  ? TSH 1.730 12/14/2020  ? PSA 1.86 06/23/2020  ? INR 1.0 06/04/2013  ? HGBA1C 6.4 (H) 03/09/2021  ? ? ? ?Assessment & Plan:  ? ?Problem List Items Addressed This Visit   ? ?  ? Respiratory  ? Acute bronchitis - Primary  ?  Treating with doxycycline. Tessalon Perles for cough. ?  ?  ? ? ?Meds ordered this encounter  ?Medications  ? doxycycline (VIBRA-TABS) 100 MG tablet  ?  Sig: Take 1 tablet (100 mg total) by mouth 2 (two) times daily.  ?  Dispense:  14 tablet  ?  Refill:  0  ? benzonatate (TESSALON) 200 MG capsule  ?  Sig: Take 1 capsule (200 mg total) by mouth 3 (three) times daily as needed for cough.  ?  Dispense:  30 capsule  ?  Refill:  0  ? ? ?Thersa Salt DO ?[facility] ? ?

## 2021-06-22 NOTE — Patient Instructions (Signed)
Medication Instructions:  ?Your physician has recommended you make the following change in your medication: ?Stop plavix ?Continue taking all other medications as directed. ? ?Labwork: ?none ? ?Testing/Procedures: ?none ? ?Follow-Up: ?Your physician recommends that you schedule a follow-up appointment in: 6 months ? ?Any Other Special Instructions Will Be Listed Below (If Applicable). ?Please call the office in one month with an update on chest pains. ? ?If you need a refill on your cardiac medications before your next appointment, please call your pharmacy. ? ?

## 2021-06-22 NOTE — Progress Notes (Signed)
Virtual Visit via Telephone Note   This visit type was conducted due to national recommendations for restrictions regarding the COVID-19 Pandemic (e.g. social distancing) in an effort to limit this patient's exposure and mitigate transmission in our community.  Due to his co-morbid illnesses, this patient is at least at moderate risk for complications without adequate follow up.  This format is felt to be most appropriate for this patient at this time.  The patient did not have access to video technology/had technical difficulties with video requiring transitioning to audio format only (telephone).  All issues noted in this document were discussed and addressed.  No physical exam could be performed with this format.  Please refer to the patient's chart for his  consent to telehealth for [facility].    Date:  06/22/2021   ID:  [name], DOB [birth year], MRN [id number] The patient was identified using 2 identifiers.  Patient Location: Home Provider Location: Office/Clinic   PCP:  Kathyrn Drown, MD   [place] [facility] Cardiologist:  Carlyle Dolly, MD Electrophysiologist:  Thompson Grayer, MD     Evaluation Performed:  Follow-Up Visit  Chief Complaint:  Follow up visit  History of Present Illness:    [name] is a 66 y.o. male seen today for follow up of the following medical problems.      1. CAD - history of DES to LAD in 08/2018, PCI to RCA in 2014   06/2019 echo: LVEF 60-65%, no WMAs, grade I DDx,  - 09/2019 nuclear stress: small apical infarct with mild peri-infarct ischemia   06/2020 cath: prox LAD 50%, patent LAD stent, LCX 85% distal, RCA 75% with stent ISR. Had PTCA of RCA ISR. From interventional could hold plavix after 30 days if needed       - last visit some fatigue, dizziness, SOB. We lowered his hydralazine to '25mg'$  bid and lowered imdur to '120mg'$  daily.  - chest pains at times with walking, resovles with rest. Overall chronic and stable.     - some chest pains with exertion, particularly with walking incline.  - no beta blocker due to low heart rates.  - compliant with meds   2. HTN - he is compliant with meds     3. Complete heart block - pacemaker followed by Dr Rayann Heman, CRT-P - normal device check Jan 2023       4. Hyperlipidemia    06/2020 TC 119 HDL 52 TG 113 LDL 47  02/2021 TC 132 TG 73 HDL 58 LDL 59     SH; just sold his house, was able to get a great price and only on market for a week. Living in a fifth wheel for the time being.  Recently bought new home.    The patient does not have symptoms concerning for COVID-19 infection (fever, chills, cough, or new shortness of breath).    Past Medical History:  Diagnosis Date   ADD (attention deficit disorder)    Rx-Adderall   Anginal pain ([place])    Aortic atherosclerosis ([facility]) 05/27/2020   Seen on x-ray being treated with statin   AV block, 3rd degree ([place])    Colon polyps 2011   tubular adenoma by excisional biopsy during colonoscopy   Complete heart block ([place])    a. s/p PPM Placement in 12/2012 with [employer] CRT-P placement in 03/2014   Coronary artery disease    a. s/p PCI of the RCA in 2014 b. 08/2018: patent RCA stent with new  mid-LAD 85% stenosis (treated with PCI/DES) and 85% distal OM2 stenosis (med management recommended)   Degenerative disc disease, cervical    Gastrointestinal bleeding 10/15/2017   GERD (gastroesophageal reflux disease)    Hypertension    Pacemaker    Palpitations 2003   Holter in 2003: PACs and PVCs; negative stress nuclear test in 2005; right bundle Lavena [place]oretto block; echo in 2008-mild LVH, otherwise normal; 2008-negative stress nuclear test.   Pneumonia    Prostatitis    prostate calcifications by CT   Sleep apnea    questionable diagnosis   Past Surgical History:  Procedure Laterality Date   ANTERIOR CERVICAL DECOMP/DISCECTOMY FUSION     BACK SURGERY     Biventricular pacemaker upgrade/ system revision  04/02/14    removal of previous atrial and ventricular leads with placement of a new MDT Consulta CRT-P system at [facility] by Dr Violet Baldy   COLONOSCOPY     COLONOSCOPY W/ POLYPECTOMY  2011   tubular adenoma   CORONARY BALLOON ANGIOPLASTY N/A 06/15/2020   Procedure: [place];  Surgeon: Jettie Booze, MD;  Location: [place] [facility];  Service: Cardiovascular;  Laterality: N/A;   CORONARY STENT INTERVENTION N/A 08/22/2018   Procedure: CORONARY STENT INTERVENTION;  Surgeon: Troy Sine, MD;  Location: [place] [facility];  Service: Cardiovascular;  Laterality: N/A;   ESOPHAGOGASTRODUODENOSCOPY     Gastritis   INSERT / REPLACE / REMOVE PACEMAKER  12/17/2012   MDT Adapta L implanted by Dr Rayann Heman for mobitz II second degree AV block   INTRAVASCULAR ULTRASOUND/IVUS N/A 06/15/2020   Procedure: Intravascular Ultrasound/IVUS;  Surgeon: Jettie Booze, MD;  Location: [place] [facility];  Service: Cardiovascular;  Laterality: N/A;   KNEE SURGERY Right    LEFT HEART CATH AND CORONARY ANGIOGRAPHY N/A 08/22/2018   Procedure: LEFT HEART CATH AND CORONARY ANGIOGRAPHY;  Surgeon: Troy Sine, MD;  Location: [place] [facility];  Service: Cardiovascular;  Laterality: N/A;   LEFT HEART CATH AND CORONARY ANGIOGRAPHY N/A 06/15/2020   Procedure: LEFT HEART CATH AND CORONARY ANGIOGRAPHY;  Surgeon: Jettie Booze, MD;  Location: [place] [facility];  Service: Cardiovascular;  Laterality: N/A;   LEFT HEART CATHETERIZATION WITH CORONARY ANGIOGRAM N/A 10/17/2012   Procedure: LEFT HEART CATHETERIZATION WITH CORONARY ANGIOGRAM;  Surgeon: Josue Hector, MD;  Location: [facility] LAB;  Service: Cardiovascular;  Laterality: N/A;   LEFT HEART CATHETERIZATION WITH CORONARY ANGIOGRAM N/A 12/17/2012   Procedure: LEFT HEART CATHETERIZATION WITH CORONARY ANGIOGRAM;  Surgeon: Peter M Martinique, MD;  Location: [facility] CATH LAB;  Service: Cardiovascular;  Laterality: N/A;   LEFT HEART CATHETERIZATION WITH  CORONARY ANGIOGRAM N/A 06/11/2013   Procedure: LEFT HEART CATHETERIZATION WITH CORONARY ANGIOGRAM;  Surgeon: Wellington Hampshire, MD;  Location: [place] CATH LAB;  Service: Cardiovascular;  Laterality: N/A;   PERCUTANEOUS CORONARY STENT INTERVENTION (PCI-S)  10/17/2012   Procedure: PERCUTANEOUS CORONARY STENT INTERVENTION (PCI-S);  Surgeon: Josue Hector, MD;  Location: [facility] CATH LAB;  Service: Cardiovascular;;   PERMANENT PACEMAKER INSERTION N/A 12/17/2012   Procedure: PERMANENT PACEMAKER INSERTION;  Surgeon: Thompson Grayer, MD;  Location: [facility] CATH LAB;  Service: Cardiovascular;  Laterality: N/A;   POLYPECTOMY     UPPER GASTROINTESTINAL ENDOSCOPY       Current Meds  Medication Sig   albuterol (VENTOLIN HFA) 108 (90 Base) MCG/ACT inhaler Inhale 2 puffs into the lungs every 4 (four) hours as needed for wheezing.   ALPRAZolam (XANAX) 0.5 MG tablet 1/2-1 twice daily as  needed home use only use sparingly   amLODipine (NORVASC) 5 MG tablet 1 tab(s) orally once a day for 30 day(s)   aspirin 81 MG chewable tablet 1 tab(s) chewed once a day for 30 day(s)   clopidogrel (PLAVIX) 75 MG tablet 1 tab(s) orally once a day for 30 day(s)   famotidine (PEPCID) 20 MG tablet One after supper   hydrALAZINE (APRESOLINE) 25 MG tablet TAKE 1 TABLET(25 MG) BY MOUTH IN THE MORNING AND AT BEDTIME   HYDROmorphone (DILAUDID) 4 MG tablet Take 1 tablet (4 mg total) by mouth every 6 (six) hours as needed for severe pain.   isosorbide mononitrate (IMDUR) 120 MG 24 hr tablet TAKE 1 TABLET(120 MG) BY MOUTH DAILY   losartan (COZAAR) 100 MG tablet 1 tab(s) orally once a day for 90 day(s)   nitroGLYCERIN (NITROSTAT) 0.4 MG SL tablet Place 1 tablet (0.4 mg total) under the tongue every 5 (five) minutes as needed for chest pain (MAX 3 TABLETS).   pantoprazole (PROTONIX) 40 MG tablet TAKE 1 TABLET(40 MG) BY MOUTH DAILY 30 TO 60 MINUTES BEFORE FIRST MEAL OF THE DAY   pregabalin (LYRICA) 50 MG capsule TAKE 1 CAPSULE BY MOUTH EVERY MORNING AND 1  CAPSULE EVERY EVENING   promethazine (PHENERGAN) 25 MG tablet TAKE 1 TABLET BY MOUTH TWICE DAILY AS NEEDED FOR NAUSEA (Patient taking differently: Take 25 mg by mouth 2 (two) times daily as needed for nausea or vomiting. TAKE 1 TABLET BY MOUTH TWICE DAILY AS NEEDED FOR NAUSEA)   rosuvastatin (CRESTOR) 20 MG tablet TAKE 1 TABLET(20 MG) BY MOUTH DAILY   sertraline (ZOLOFT) 25 MG tablet 1 tab(s) orally once a day for 30 day(s)   tamsulosin (FLOMAX) 0.4 MG CAPS capsule Take 1 capsule (0.4 mg total) by mouth daily.     Allergies:   Morphine and related, Lasix [furosemide], and Latex   Social History   Tobacco Use   Smoking status: Former    Packs/day: 2.50    Years: 40.00    Pack years: 100.00    Types: Cigarettes    Start date: 04/18/1967    Quit date: 12/20/2003    Years since quitting: 17.5   Smokeless tobacco: Former  Scientific laboratory technician Use: Former  Substance Use Topics   Alcohol use: Yes    Alcohol/week: 1.0 standard drink    Types: 1 Cans of beer per week   Drug use: Not Currently    Types: Marijuana    Comment: 30 + years ago - "crank, marjiuanna, ect."     Family Hx: The patient's family history includes Breast cancer in his maternal aunt; Heart disease in his mother; Hypertension in his mother; Prostate cancer in his brother. There is no history of Colon cancer or Colon polyps.  ROS:   Please see the history of present illness.     All other systems reviewed and are negative.   Prior CV studies:   The following studies were reviewed today:    Labs/Other Tests and Data Reviewed:    EKG:  No ECG reviewed.  Recent Labs: 06/23/2020: ALT 18 12/14/2020: BNP 68.9; Hemoglobin 14.4; Platelets 208; TSH 1.730 03/09/2021: BUN 20; Creatinine, Ser 1.01; Potassium 4.7; Sodium 141   Recent Lipid Panel Lab Results  Component Value Date/Time   CHOL 132 03/09/2021 10:23 AM   TRIG 73 03/09/2021 10:23 AM   HDL 58 03/09/2021 10:23 AM   CHOLHDL 2.3 03/09/2021 10:23 AM   CHOLHDL  2.3 06/23/2020 10:10 AM  LDLCALC 59 03/09/2021 10:23 AM   LDLCALC 47 06/23/2020 10:10 AM    Wt Readings from Last 3 Encounters:  06/22/21 225 lb (102.1 kg)  04/06/21 233 lb (105.7 kg)  04/05/21 233 lb 3.2 oz (105.8 kg)      Objective:    Vital Signs:  BP 135/69    Pulse 63    Ht '5\' 11"'$  (1.803 m)    Wt 225 lb (102.1 kg)    SpO2 93%    BMI 31.38 kg/m    Normal affect. Normal speech pattern and tone. Comfortable, no apparent distress. No audible signs of sob or wheezing.   ASSESSMENT & PLAN:    1.CAD with chronic stable angina -stable exertional angina with moderate to high levels of exertion, suspect secondary to residual small vessel disease - continue current meds, conuld titrate up norvasc or consider ranexa in the future - 1 year since PTCA, can d/c plavix  2. HTN -at goal, continue current meds  3.Hyperlipidemia - LDL at goal, continue current meds           COVID-19 Education: The signs and symptoms of COVID-19 were discussed with the patient and how to seek care for testing (follow up with PCP or arrange E-visit).  The importance of social distancing was discussed today.  Time:   Today, I have spent 22 minutes with the patient with telehealth technology discussing the above problems.     Medication Adjustments/Labs and Tests Ordered: Current medicines are reviewed at length with the patient today.  Concerns regarding medicines are outlined above.   Tests Ordered: No orders of the defined types were placed in this encounter.   Medication Changes: No orders of the defined types were placed in this encounter.   Follow Up:  In Person 6 months  Signed, Carlyle Dolly, MD  06/22/2021 1:44 PM    [facility]

## 2021-06-23 ENCOUNTER — Ambulatory Visit: Payer: Medicare Other | Admitting: Cardiology

## 2021-06-27 MED ORDER — NADOLOL 80 MG PO TABS: ORAL_TABLET | ORAL | 1 refills | Status: DC

## 2021-07-20 ENCOUNTER — Telehealth: Payer: Self-pay | Admitting: Cardiology

## 2021-07-20 NOTE — Telephone Encounter (Signed)
Pt c/o medication issue: ? ?1. Name of Medication: clopidogrel (PLAVIX) 75 MG tablet  ? ?2. How are you currently taking this medication (dosage and times per day)? Not currently taking ? ?3. Are you having a reaction (difficulty breathing--STAT)? yes ? ?4. What is your medication issue? Pt states since he's been taken off of Plavix his heart is all over the place (fluttering and chest pain)  ? ?

## 2021-07-21 ENCOUNTER — Encounter: Payer: Self-pay | Admitting: Family Medicine

## 2021-07-21 ENCOUNTER — Ambulatory Visit (INDEPENDENT_AMBULATORY_CARE_PROVIDER_SITE_OTHER): Payer: Medicare Other

## 2021-07-21 ENCOUNTER — Ambulatory Visit (INDEPENDENT_AMBULATORY_CARE_PROVIDER_SITE_OTHER): Payer: Medicare Other | Admitting: Family Medicine

## 2021-07-21 ENCOUNTER — Other Ambulatory Visit (HOSPITAL_COMMUNITY)
Admission: RE | Admit: 2021-07-21 | Discharge: 2021-07-21 | Disposition: A | Discharge status: Home / Self Care | Payer: Medicare Other | Source: Ambulatory Visit | Attending: Family Medicine | Admitting: Family Medicine

## 2021-07-21 ENCOUNTER — Telehealth: Payer: Self-pay | Admitting: Cardiology

## 2021-07-21 ENCOUNTER — Ambulatory Visit (HOSPITAL_COMMUNITY)
Admission: RE | Admit: 2021-07-21 | Discharge: 2021-07-21 | Disposition: A | Discharge status: Home / Self Care | Payer: Medicare Other | Source: Ambulatory Visit | Attending: Family Medicine | Admitting: Family Medicine

## 2021-07-21 VITALS — BP 110/66 | HR 61 | Ht 71.0 in | Wt 233.4 lb

## 2021-07-21 DIAGNOSIS — R002 Palpitations: Secondary | ICD-10-CM | POA: Diagnosis not present

## 2021-07-21 DIAGNOSIS — R079 Chest pain, unspecified: Secondary | ICD-10-CM

## 2021-07-21 DIAGNOSIS — I442 Atrioventricular block, complete: Secondary | ICD-10-CM | POA: Diagnosis not present

## 2021-07-21 DIAGNOSIS — R0609 Other forms of dyspnea: Secondary | ICD-10-CM | POA: Insufficient documentation

## 2021-07-21 LAB — BASIC METABOLIC PANEL
Anion gap: 9 (ref 5–15)
BUN: 19 mg/dL (ref 8–23)
CO2: 26 mmol/L (ref 22–32)
Calcium: 9.8 mg/dL (ref 8.9–10.3)
Chloride: 105 mmol/L (ref 98–111)
Creatinine, Ser: 1.1 mg/dL (ref 0.61–1.24)
GFR, Estimated: >60 mL/min (ref >60)
Glucose, Bld: 207 mg/dL — ABNORMAL HIGH (ref 70–99)
Potassium: 4.2 mmol/L (ref 3.5–5.1)
Sodium: 140 mmol/L (ref 135–145)

## 2021-07-21 LAB — TROPONIN I (HIGH SENSITIVITY): Troponin I (High Sensitivity): 3 ng/L (ref <18)

## 2021-07-21 LAB — BRAIN NATRIURETIC PEPTIDE: B Natriuretic Peptide: 71 pg/mL (ref 0.0–100.0)

## 2021-07-21 MED ORDER — RANOLAZINE ER 500 MG PO TB12: 500.0000 mg | ORAL_TABLET | Freq: Two times a day (BID) | ORAL | 11 refills | Status: DC

## 2021-07-21 MED ORDER — ALPRAZOLAM 0.5 MG PO TABS: ORAL_TABLET | ORAL | 0 refills | Status: DC

## 2021-07-21 MED ORDER — HYDROMORPHONE HCL 4 MG PO TABS: 4.0000 mg | ORAL_TABLET | Freq: Four times a day (QID) | ORAL | 0 refills | Status: DC | PRN

## 2021-07-21 NOTE — Progress Notes (Signed)
? ?  Subjective:  ? ? Patient ID: [name], male    DOB: December 15, 1955, 66 y.o.   MRN: [id number] ? ?HPI ? ?Patient arrives with heart palpitations. Patient states they were so bad last night he almost went to ER. Cardiologist recently stopped Plavix ?Patient states the symptoms began after stopping Plavix 2 days later.  He has had several weeks of intermittent chest pain but he has noted over the past few days more discomfort.  He also relates a fair amount of shortness of breath with activity has been going on for several weeks but seemingly worse recently.  Patient does have underlying coronary artery disease with several small vessels with moderate buildup that were felt to be best handled by medical approach.  Patient is under the care of Dr. Harl Bowie. ?Review of Systems ? ?   ?Objective:  ? Physical Exam ?General-in no acute distress ?Eyes-no discharge ?Lungs-respiratory rate normal, CTA ?CV-no murmurs,RRR intermittent extra beat noted more than likely PAC.  No atrial fibrillation on EKG ?Extremities skin warm dry no edema ?Neuro grossly normal ?Behavior normal, alert ? ?It should be noted that patient's blood pressure was 110/66 sitting and then when standing 100/60 I recommended for him to reduce hydralazine to half tablet twice daily ? ? ?   ?Assessment & Plan:  ?1. Palpitations ?Intermittent palpitations could be PACs.  EKG overall looks good.  Do not see any acute ST-T segment changes. ?- PR ELECTROCARDIOGRAM, COMPLETE ?- DG Chest 2 View ?- Basic metabolic panel ?- Brain natriuretic peptide ?- Troponin I ? ?2. DOE (dyspnea on exertion) ?Patient relates significant shortness of breath with activity.  Recommend echo.  Also recommend follow-up with cardiology.  Will try to reach his cardiologist Dr. Harl Bowie to discuss switching him to Ranexa.  We will also go ahead and check troponin BNP for precautions ?- DG Chest 2 View ?- Basic metabolic panel ?- Brain natriuretic peptide ?- Troponin I ? ?3. Chest pain,  unspecified type ?Given that he has had some atypical chest pain for several weeks worse over the past few days it is concerning but we will check lab work if that comes back reassuring then patient can do follow-up with cardiology please see discussion above ?- DG Chest 2 View ?- Basic metabolic panel ?- Brain natriuretic peptide ?- Troponin I ? ?Patient uses Xanax intermittently for anxiety refill given not to take with pain medicine ? ?Patient has chronic low back pain uses pain medicine intermittently refill given does not tolerate other pain medicine ? ?I did have a good discussion with Bernerd Pho, PA with Dr. Nelly Laurence practice.  We did discuss patient's symptoms.  Also discussed negative BNP negative troponin.  Also discussed starting Ranexa to see if this would help with his anginal symptoms plus also having him do a follow-up visit with them for more definitive testing including possibility of imaging versus echo but we will leave this to the discretion of cardiology. ? ?The patient was counseled if he starts having severe chest pressure pain discomfort that he may well need to proceed to the ER but currently right now it is not felt that he is having acute coronary syndrome.  I do not feel that he is having aortic dissection either. ?

## 2021-07-21 NOTE — Telephone Encounter (Signed)
? ?  Dr. Harl Bowie was off this afternoon, therefore I spoke with Dr. Wolfgang Phoenix who reported that the patient has been experiencing worsening dyspnea on exertion for the past few weeks along with intermittent chest pain. He did check a BNP and Hs Troponin which were normal. In reviewing Dr. Nelly Laurence last note, he was considering starting Ranexa. I did recommend starting Ranexa 500 mg twice daily and this can be titrated if needed. Alda Berthold please verify his pharmacy with the patient and send in the Rx).  ? ?The patient wished to have a follow-up visit with Dr. Harl Bowie but nothing is available for several months. I will route this note to Dr. Harl Bowie to make him aware and see if there is any way we can fit him on the schedule over the next few weeks for if symptoms do not improve, he may require repeat ischemic evaluation. ? ?Signed, ?[name], PA-C ?07/21/2021, 4:07 PM ?Pager: [phone] ? ?

## 2021-07-21 NOTE — Telephone Encounter (Signed)
Office calling back due to not hearing back from [place] yet. Spoke with LPN who advised she will be contacting them as soon she is out of clinic. Office verbalized understanding.  ?

## 2021-07-21 NOTE — Telephone Encounter (Signed)
Reports constant chest pressure rated 5/10 and chest fluttering since 06/24/2021. Reports some lightheadedness and SOB most with activity or if BP is low. Reports active chest pressure rated 6/10. Denies active SOB or lightheadedness.  ?Saw PCP today and did orthostatics, EKG, lab work and cxr today. EKG not available for review yet. PCP sent request to Dr. Harl Bowie. ?Advised message would be sent to his provider for recommendations and if symptoms got worse, to go to the ED for an evaluation. Verbalized understanding of plan. ?

## 2021-07-21 NOTE — Telephone Encounter (Signed)
Called to notify patient of med change. No answer. Unable to leave msg d/t mailbox being full.  ?

## 2021-07-21 NOTE — Telephone Encounter (Signed)
Dr. Wolfgang Phoenix calling to speak with a provider for the patient. Phone: [phone] ?

## 2021-07-22 NOTE — Telephone Encounter (Signed)
Can see him April 12 at noon in [place], if severe symptoms prior then needs ER eval ? ?Zandra Abts MD ?

## 2021-07-25 LAB — CUP PACEART REMOTE DEVICE CHECK
Battery Remaining Longevity: 28 mo
Battery Voltage: 2.93 V
Brady Statistic AP VP Percent: 21.99 %
Brady Statistic AP VS Percent: 0.04 %
Brady Statistic AS VP Percent: 77.8 %
Brady Statistic AS VS Percent: 0.17 %
Brady Statistic RA Percent Paced: 21.97 %
Brady Statistic RV Percent Paced: 99.71 %
Date Time Interrogation Session: 20230407172439
Implantable Lead Implant Date: 20151217
Implantable Lead Implant Date: 20151217
Implantable Lead Implant Date: 20151217
Implantable Lead Location: 753858
Implantable Lead Location: 753859
Implantable Lead Location: 753860
Implantable Lead Model: 4196
Implantable Lead Model: 5076
Implantable Lead Model: 5076
Implantable Pulse Generator Implant Date: 20151217
Lead Channel Impedance Value: 1007 Ohm
Lead Channel Impedance Value: 361 Ohm
Lead Channel Impedance Value: 380 Ohm
Lead Channel Impedance Value: 399 Ohm
Lead Channel Impedance Value: 437 Ohm
Lead Channel Impedance Value: 589 Ohm
Lead Channel Impedance Value: 608 Ohm
Lead Channel Impedance Value: 684 Ohm
Lead Channel Impedance Value: 684 Ohm
Lead Channel Pacing Threshold Amplitude: 0.75 V
Lead Channel Pacing Threshold Amplitude: 0.75 V
Lead Channel Pacing Threshold Amplitude: 1 V
Lead Channel Pacing Threshold Pulse Width: 0.4 ms
Lead Channel Pacing Threshold Pulse Width: 0.4 ms
Lead Channel Pacing Threshold Pulse Width: 0.4 ms
Lead Channel Sensing Intrinsic Amplitude: 0.75 mV
Lead Channel Sensing Intrinsic Amplitude: 0.75 mV
Lead Channel Sensing Intrinsic Amplitude: 22.5 mV
Lead Channel Sensing Intrinsic Amplitude: 22.5 mV
Lead Channel Setting Pacing Amplitude: 2 V
Lead Channel Setting Pacing Amplitude: 2.25 V
Lead Channel Setting Pacing Amplitude: 2.5 V
Lead Channel Setting Pacing Pulse Width: 0.4 ms
Lead Channel Setting Pacing Pulse Width: 0.4 ms
Lead Channel Setting Sensing Sensitivity: 0.9 mV

## 2021-07-25 NOTE — Telephone Encounter (Signed)
Spoke with pt who states that he has started taking the Ranexa but is has not helped. Pt states that his heart is "Beating all over the place", he has SOB and chest pain. Please advise.  ?

## 2021-07-26 NOTE — Telephone Encounter (Signed)
I spoke with patient and he states he saw Dr.Luking and had lab work done and he is comfortable seeing Dr.Branch tomorrow. Patient states he has a medication problem vs heart issue. ?

## 2021-07-26 NOTE — Telephone Encounter (Signed)
Per patient and mychart message asking if he can see Branch tomorrow ? ?Hello, yes I can. Thank you  ? ?Message sent to scheduler to add on to schedule at noon tomorrow. ?

## 2021-07-26 NOTE — Telephone Encounter (Signed)
We have appt tomorrow at noon, if severe symptoms would need ER evaluation prior to that. If mild to moderate symptoms will evaluate tomorrow at our appt ? ?Zandra Abts MD ?

## 2021-07-27 ENCOUNTER — Other Ambulatory Visit: Payer: Self-pay | Admitting: Cardiology

## 2021-07-27 ENCOUNTER — Ambulatory Visit (INDEPENDENT_AMBULATORY_CARE_PROVIDER_SITE_OTHER): Payer: Medicare Other

## 2021-07-27 ENCOUNTER — Ambulatory Visit (INDEPENDENT_AMBULATORY_CARE_PROVIDER_SITE_OTHER): Payer: Medicare Other | Admitting: Cardiology

## 2021-07-27 ENCOUNTER — Encounter: Payer: Self-pay | Admitting: Cardiology

## 2021-07-27 VITALS — BP 130/70 | HR 56 | Ht 71.0 in | Wt 233.6 lb

## 2021-07-27 DIAGNOSIS — I25118 Atherosclerotic heart disease of native coronary artery with other forms of angina pectoris: Secondary | ICD-10-CM

## 2021-07-27 DIAGNOSIS — R002 Palpitations: Secondary | ICD-10-CM

## 2021-07-27 MED ORDER — NADOLOL 80 MG PO TABS: 120.0000 mg | ORAL_TABLET | Freq: Every day | ORAL | 1 refills | Status: DC

## 2021-07-27 NOTE — Progress Notes (Signed)
? ? ? ?Clinical Summary ?[name] is a 66 y.o.male seen today for follow up of the following medical problems. This is a focused visit on recent symptoms of palpitations and chest pain.  ?  ?  ?1. CAD ?- history of DES to LAD in 08/2018, PCI to RCA in 2014 ?  ?06/2019 echo: LVEF 60-65%, no WMAs, grade I DDx,  ?- 09/2019 nuclear stress: small apical infarct with mild peri-infarct ischemia ?  ?06/2020 cath: prox LAD 50%, patent LAD stent, LCX 85% distal, RCA 75% with stent ISR. Had PTCA of RCA ISR. From interventional could hold plavix after 30 days if needed ?  ?  ?  ?- last visit some fatigue, dizziness, SOB. We lowered his hydralazine to '25mg'$  bid and lowered imdur to '120mg'$  daily.  ? ? ?- recent chest pain and SOB ?- seen by pcp, EKG without acute ischemic changes per note. Trop neg, BNP neg ?- started ranexa ? ? ? ?2.Palpitations ?- feeling of skipping heart beat, associated with pressure in chest and into neck. ?- increase in frequency recently.  ?- rare caffeine, rare EtOH ? ? ? ?Other medical issues not addressed this visit ?  ?2. HTN ?- he is compliant with meds ?  ?- home bp's 100s-140s.  ?  ?3. Complete heart block ?- pacemaker followed by Dr Rayann Heman, CRT-P ?- normal device check Jan 2023 ?  ? 07/2021 device check 2 NSVT 8 and 9 beats. ? ?  ?4. Hyperlipidemia ?  ? 06/2020 TC 119 HDL 52 TG 113 LDL 47 ? 02/2021 TC 132 TG 73 HDL 58 LDL 59 ?  ? ?5. Orthostastic hypotension ?- can have lightheadedness with standing ?- she reports SBP 120-->110 ?  ?SH; just sold his house, was able to get a great price and only on market for a week. Living in a fifth wheel for the time being.  ?Recently bought new home.  ? ? ?Past Medical History:  ?Diagnosis Date  ? ADD (attention deficit disorder)   ? Rx-Adderall  ? Anginal pain ([place])   ? Aortic atherosclerosis ([place]) 05/27/2020  ? Seen on x-ray being treated with statin  ? AV block, 3rd degree ([facility])   ? Colon polyps 2011  ? tubular adenoma by excisional biopsy during colonoscopy  ?  Complete heart block ([place])   ? a. s/p PPM Placement in 12/2012 with Medtronic CRT-P placement in 03/2014  ? Coronary artery disease   ? a. s/p PCI of the RCA in 2014 b. 08/2018: patent RCA stent with new mid-LAD 85% stenosis (treated with PCI/DES) and 85% distal OM2 stenosis (med management recommended)  ? Degenerative disc disease, cervical   ? Gastrointestinal bleeding 10/15/2017  ? GERD (gastroesophageal reflux disease)   ? Hypertension   ? Pacemaker   ? Palpitations 2003  ? Holter in 2003: PACs and PVCs; negative stress nuclear test in 2005; right bundle Kharon Hixon block; echo in 2008-mild LVH, otherwise normal; 2008-negative stress nuclear test.  ? Pneumonia   ? Prostatitis   ? prostate calcifications by CT  ? Sleep apnea   ? questionable diagnosis  ? ? ? ?Allergies  ?Allergen Reactions  ? Morphine And Related Itching and Rash  ?  redness  ? Lasix [Furosemide]   ?  Chest pain and tightness  ? Latex Rash  ? ? ? ?Current Outpatient Medications  ?Medication Sig Dispense Refill  ? albuterol (VENTOLIN HFA) 108 (90 Base) MCG/ACT inhaler Inhale 2 puffs into the lungs every 4 (four) hours  as needed for wheezing. 18 g 2  ? ALPRAZolam (XANAX) 0.5 MG tablet 1/2-1 twice daily as needed home use only use sparingly 20 tablet 0  ? amLODipine (NORVASC) 5 MG tablet 1 tab(s) orally once a day for 30 day(s)    ? aspirin 81 MG chewable tablet 1 tab(s) chewed once a day for 30 day(s)    ? benzonatate (TESSALON) 200 MG capsule Take 1 capsule (200 mg total) by mouth 3 (three) times daily as needed for cough. 30 capsule 0  ? doxycycline (VIBRA-TABS) 100 MG tablet Take 1 tablet (100 mg total) by mouth 2 (two) times daily. 14 tablet 0  ? famotidine (PEPCID) 20 MG tablet One after supper 30 tablet 11  ? hydrALAZINE (APRESOLINE) 25 MG tablet TAKE 1 TABLET(25 MG) BY MOUTH IN THE MORNING AND AT BEDTIME (Patient taking differently: Take 12.5 mg by mouth in the morning and at bedtime.) 180 tablet 1  ? HYDROmorphone (DILAUDID) 4 MG tablet Take 1  tablet (4 mg total) by mouth every 6 (six) hours as needed for severe pain. 20 tablet 0  ? isosorbide mononitrate (IMDUR) 120 MG 24 hr tablet TAKE 1 TABLET(120 MG) BY MOUTH DAILY 90 tablet 1  ? losartan (COZAAR) 100 MG tablet 1 tab(s) orally once a day for 90 day(s)    ? nadolol (CORGARD) 80 MG tablet TAKE 1 TABLET(80 MG) BY MOUTH DAILY 90 tablet 1  ? nitroGLYCERIN (NITROSTAT) 0.4 MG SL tablet Place 1 tablet (0.4 mg total) under the tongue every 5 (five) minutes as needed for chest pain (MAX 3 TABLETS). 25 tablet 3  ? pantoprazole (PROTONIX) 40 MG tablet TAKE 1 TABLET(40 MG) BY MOUTH DAILY 30 TO 60 MINUTES BEFORE FIRST MEAL OF THE DAY 30 tablet 2  ? pregabalin (LYRICA) 50 MG capsule TAKE 1 CAPSULE BY MOUTH EVERY MORNING AND 1 CAPSULE EVERY EVENING 60 capsule 3  ? promethazine (PHENERGAN) 25 MG tablet TAKE 1 TABLET BY MOUTH TWICE DAILY AS NEEDED FOR NAUSEA (Patient taking differently: Take 25 mg by mouth 2 (two) times daily as needed for nausea or vomiting. TAKE 1 TABLET BY MOUTH TWICE DAILY AS NEEDED FOR NAUSEA) 30 tablet 5  ? ranolazine (RANEXA) 500 MG 12 hr tablet Take 1 tablet (500 mg total) by mouth 2 (two) times daily. 60 tablet 11  ? rosuvastatin (CRESTOR) 20 MG tablet TAKE 1 TABLET(20 MG) BY MOUTH DAILY 90 tablet 1  ? sertraline (ZOLOFT) 25 MG tablet 1 tab(s) orally once a day for 30 day(s)    ? tamsulosin (FLOMAX) 0.4 MG CAPS capsule Take 1 capsule (0.4 mg total) by mouth daily. 90 capsule 1  ? ?No current facility-administered medications for this visit.  ? ? ? ?Past Surgical History:  ?Procedure Laterality Date  ? ANTERIOR CERVICAL DECOMP/DISCECTOMY FUSION    ? BACK SURGERY    ? Biventricular pacemaker upgrade/ system revision  04/02/14  ? removal of previous atrial and ventricular leads with placement of a new MDT Consulta CRT-P system at [facility] by Dr Violet Baldy  ? COLONOSCOPY    ? COLONOSCOPY W/ POLYPECTOMY  2011  ? tubular adenoma  ? CORONARY BALLOON ANGIOPLASTY N/A 06/15/2020  ?  Procedure: CORONARY BALLOON ANGIOPLASTY;  Surgeon: Jettie Booze, MD;  Location: [place] [facility];  Service: Cardiovascular;  Laterality: N/A;  ? CORONARY STENT INTERVENTION N/A 08/22/2018  ? Procedure: CORONARY STENT INTERVENTION;  Surgeon: Troy Sine, MD;  Location: [place] [facility];  Service: Cardiovascular;  Laterality: N/A;  ?  ESOPHAGOGASTRODUODENOSCOPY    ? Gastritis  ? INSERT / REPLACE / REMOVE PACEMAKER  12/17/2012  ? MDT Adapta L implanted by Dr Rayann Heman for mobitz II second degree AV block  ? INTRAVASCULAR ULTRASOUND/IVUS N/A 06/15/2020  ? Procedure: Intravascular Ultrasound/IVUS;  Surgeon: Jettie Booze, MD;  Location: [place] [facility];  Service: Cardiovascular;  Laterality: N/A;  ? KNEE SURGERY Right   ? LEFT HEART CATH AND CORONARY ANGIOGRAPHY N/A 08/22/2018  ? Procedure: LEFT HEART CATH AND CORONARY ANGIOGRAPHY;  Surgeon: Troy Sine, MD;  Location: [place] [facility];  Service: Cardiovascular;  Laterality: N/A;  ? LEFT HEART CATH AND CORONARY ANGIOGRAPHY N/A 06/15/2020  ? Procedure: LEFT HEART CATH AND CORONARY ANGIOGRAPHY;  Surgeon: Jettie Booze, MD;  Location: [place] [facility];  Service: Cardiovascular;  Laterality: N/A;  ? LEFT HEART CATHETERIZATION WITH CORONARY ANGIOGRAM N/A 10/17/2012  ? Procedure: LEFT HEART CATHETERIZATION WITH CORONARY ANGIOGRAM;  Surgeon: Josue Hector, MD;  Location: [facility] CATH LAB;  Service: Cardiovascular;  Laterality: N/A;  ? LEFT HEART CATHETERIZATION WITH CORONARY ANGIOGRAM N/A 12/17/2012  ? Procedure: LEFT HEART CATHETERIZATION WITH CORONARY ANGIOGRAM;  Surgeon: Peter M Martinique, MD;  Location: [facility] CATH LAB;  Service: Cardiovascular;  Laterality: N/A;  ? LEFT HEART CATHETERIZATION WITH CORONARY ANGIOGRAM N/A 06/11/2013  ? Procedure: LEFT HEART CATHETERIZATION WITH CORONARY ANGIOGRAM;  Surgeon: Wellington Hampshire, MD;  Location: [place] CATH LAB;  Service: Cardiovascular;  Laterality: N/A;  ? PERCUTANEOUS CORONARY STENT INTERVENTION (PCI-S)  10/17/2012  ?  Procedure: PERCUTANEOUS CORONARY STENT INTERVENTION (PCI-S);  Surgeon: Josue Hector, MD;  Location: [facility] CATH LAB;  Service: Cardiovascular;;  ? PERMANENT PACEMAKER INSERTION N/A 12/17/2012  ? Procedure: PERMANENT PAC

## 2021-07-27 NOTE — Patient Instructions (Addendum)
Medication Instructions:  ?Your physician has recommended you make the following change in your medication:  ?Stop hydralazine ?Increase nadolol to 120 mg (1.5 tablets) once a day ?Continue all other medications as directed ? ?Labwork: ?none ? ?Testing/Procedures: ?ZIO- Long Term Monitor Instructions  ? ?Your physician has requested you wear your ZIO patch monitor 3 days.  ? ?This is a single patch monitor.  [employer] supplies one patch monitor per enrollment.  Additional stickers are not available. ?  ?Your ZIO patch monitor will be sent USPS Priority mail from [facility] directly to your home address. The monitor may also be mailed to a PO BOX if home delivery is not available.   It may take 3-5 days to receive your monitor after you have been enrolled. ?  ?Once you have received you monitor, please review enclosed instructions.  Your monitor has already been registered assigning a specific monitor serial # to you. ? ? ?Applying the monitor  ? ?Shave hair from upper left chest. ?  ?Hold abrader disc by orange tab.  Rub abrader in 40 strokes over left upper chest as indicated in your monitor instructions. ?  ?Clean area with 4 enclosed alcohol pads .  Use all pads to assure are is cleaned thoroughly.  Let dry.  ? ?Apply patch as indicated in monitor instructions.  Patch will be place under collarbone on left side of chest with arrow pointing upward. ?  ?Rub patch adhesive wings for 2 minutes.Remove white label marked "1".  Remove white label marked "2".  Rub patch adhesive wings for 2 additional minutes. ?  ?While looking in a mirror, press and release button in center of patch.  A small green light will flash 3-4 times .  This will be your only indicator the monitor has been turned on. ?    ?Do not shower for the first 24 hours.  You may shower after the first 24 hours. ?  ?Press button if you feel a symptom. You will hear a small click.  Record Date, Time and Symptom in the Patient Log Book. ?  ?When you  are ready to remove patch, follow instructions on last 2 pages of Patient Log Book.  Stick patch monitor onto last page of Patient Log Book. ?  ?Place Patient Log Book in [facility] box.  Use locking tab on box and tape box closed securely.  The [employer] has [employer] on it.  Please place in mailbox as soon as possible.  Your physician should have your test results approximately 7 days after the monitor has been mailed back to [facility]. ?  ?Call [facility] at [phone] if you have questions regarding your ZIO XT patch monitor.  Call them immediately if you see an orange light blinking on your monitor. ?  ?If your monitor falls off in less than 4 days contact our Monitor department at [phone].  If your monitor becomes loose or falls off after 4 days call [employer] at [phone] for suggestions on securing your monitor. ? ? ?Follow-Up: ?Your physician recommends that you schedule a follow-up appointment in:  ? ?Any Other Special Instructions Will Be Listed Below (If Applicable). ? ?Please call the office on Friday with an update on symptoms ? ?If you need a refill on your cardiac medications before your next appointment, please call your pharmacy. ? ?

## 2021-08-01 DIAGNOSIS — R002 Palpitations: Secondary | ICD-10-CM | POA: Diagnosis not present

## 2021-08-03 ENCOUNTER — Encounter: Payer: Self-pay | Admitting: Cardiology

## 2021-08-03 ENCOUNTER — Encounter: Payer: Self-pay | Admitting: Family Medicine

## 2021-08-04 ENCOUNTER — Other Ambulatory Visit: Payer: Self-pay | Admitting: Family Medicine

## 2021-08-05 NOTE — Progress Notes (Signed)
Remote pacemaker transmission.   

## 2021-08-08 NOTE — Telephone Encounter (Signed)
Thx for update, will see what monitor shows, will help guide next changes ? ?Zandra Abts MD ?

## 2021-08-16 ENCOUNTER — Ambulatory Visit (INDEPENDENT_AMBULATORY_CARE_PROVIDER_SITE_OTHER): Payer: Medicare Other | Admitting: Family Medicine

## 2021-08-16 ENCOUNTER — Other Ambulatory Visit: Payer: Self-pay | Admitting: Cardiology

## 2021-08-16 ENCOUNTER — Other Ambulatory Visit: Payer: Self-pay | Admitting: Family Medicine

## 2021-08-16 VITALS — BP 118/64 | HR 65 | Temp 98.1°F | Ht 71.0 in | Wt 233.0 lb

## 2021-08-16 DIAGNOSIS — R1031 Right lower quadrant pain: Secondary | ICD-10-CM

## 2021-08-16 DIAGNOSIS — E118 Type 2 diabetes mellitus with unspecified complications: Secondary | ICD-10-CM

## 2021-08-16 DIAGNOSIS — R0609 Other forms of dyspnea: Secondary | ICD-10-CM

## 2021-08-16 NOTE — Progress Notes (Signed)
? ?  Subjective:  ? ? Patient ID: [name], male    DOB: [birth year], 66 y.o.   MRN: [id number] ? ?HPI ?Follow up from cardiology referral ,Patient finds himself feeling short of breath with activity finds himself having some tightness in the chest relates some swelling in the legs low energy level.  Patient is concerned about the possibility of an impending heart attack has had recent lab work which troponin and BNP were normal.  Patient did have EKG previously which had abnormality follow-up EKG looks better today.  In addition to this patient did have telemetry has not heard the results yet.  I reviewed over as best I could saw short run of V. tach but did not see any other pathology but once again cardiology will need to give definitive review ? ?Review of Systems ? ?   ?Objective:  ? Physical Exam ?Heart regular pulse controlled extremities no edema skin warm dry blood pressure good lungs clear ? ? ? ?   ?Assessment & Plan:  ?1. DOE (dyspnea on exertion) ?Significant DOE ?Concerning for the possibility of underlying heart disease ?Recent telemetry reviewed-I did tell the patient I was not a cardiologist but I do not see any acute findings that would explain his DOE ?Certainly cardiology will review and weigh in on the findings regarding any arrhythmias ? ?Patient does have a history of stents.  He states he is feeling very similar to how he did the last time he had to have a stent ?He has very poor exercise tolerance with increased shortness of breath ?We will connect with cardiology to see if they would recommend moving forward with catheterization ?Or possibility of moving forward with coronary CTA ?- Hemoglobin A1c ?- PSA ?- CBC with Differential/Platelet ?- EKG 12-Lead ? ?2. Right inguinal pain ?Significant right inguinal pain I believe this could well be hernia going on.  We will touch base with cardiology to see if they feel that ultrasound may be the best modality to help pin this down? ?- Hemoglobin  A1c ?- PSA ?- CBC with Differential/Platelet ? ?3. DM type 2, controlled, with complication ([place]) ?Patient is inquisitive about utilizing Trulicity or Mounjaro or Ozempic.  Need to check A1c.  Also need to consider metformin as first step ?- Hemoglobin A1c ?- PSA ?- CBC with Differential/Platelet ? ? ?

## 2021-08-17 DIAGNOSIS — E118 Type 2 diabetes mellitus with unspecified complications: Secondary | ICD-10-CM | POA: Diagnosis not present

## 2021-08-17 DIAGNOSIS — R1031 Right lower quadrant pain: Secondary | ICD-10-CM | POA: Diagnosis not present

## 2021-08-17 DIAGNOSIS — R0609 Other forms of dyspnea: Secondary | ICD-10-CM | POA: Diagnosis not present

## 2021-08-18 ENCOUNTER — Other Ambulatory Visit: Payer: Self-pay | Admitting: Cardiology

## 2021-08-18 ENCOUNTER — Other Ambulatory Visit: Payer: Self-pay | Admitting: Family Medicine

## 2021-08-18 LAB — CBC WITH DIFFERENTIAL/PLATELET
Basophils Absolute: 0 10*3/uL (ref 0.0–0.2)
Basos: 1 %
EOS (ABSOLUTE): 0.2 10*3/uL (ref 0.0–0.4)
Eos: 3 %
Hematocrit: 46.4 % (ref 37.5–51.0)
Hemoglobin: 15.8 g/dL (ref 13.0–17.7)
Immature Grans (Abs): 0 10*3/uL (ref 0.0–0.1)
Immature Granulocytes: 0 %
Lymphocytes Absolute: 1.5 10*3/uL (ref 0.7–3.1)
Lymphs: 25 %
MCH: 30.6 pg (ref 26.6–33.0)
MCHC: 34.1 g/dL (ref 31.5–35.7)
MCV: 90 fL (ref 79–97)
Monocytes Absolute: 0.6 10*3/uL (ref 0.1–0.9)
Monocytes: 9 %
Neutrophils Absolute: 3.9 10*3/uL (ref 1.4–7.0)
Neutrophils: 62 %
Platelets: 209 10*3/uL (ref 150–450)
RBC: 5.16 x10E6/uL (ref 4.14–5.80)
RDW: 12.8 % (ref 11.6–15.4)
WBC: 6.2 10*3/uL (ref 3.4–10.8)

## 2021-08-18 LAB — PSA: Prostate Specific Ag, Serum: 2.7 ng/mL (ref 0.0–4.0)

## 2021-08-18 LAB — HEMOGLOBIN A1C
Est. average glucose Bld gHb Est-mCnc: 186 mg/dL
Hgb A1c MFr Bld: 8.1 % — ABNORMAL HIGH (ref 4.8–5.6)

## 2021-08-19 ENCOUNTER — Other Ambulatory Visit: Payer: Self-pay | Admitting: Family Medicine

## 2021-08-19 NOTE — Telephone Encounter (Signed)
Please confirm with patient that he is taking this if he is may have 90-day with 1 refill ?

## 2021-08-19 NOTE — Telephone Encounter (Signed)
Mychart message sent to patient.

## 2021-08-19 NOTE — Telephone Encounter (Signed)
My chart message sent to patient 08/19/21 ?

## 2021-08-20 ENCOUNTER — Other Ambulatory Visit: Payer: Self-pay | Admitting: Cardiology

## 2021-08-20 ENCOUNTER — Other Ambulatory Visit: Payer: Self-pay | Admitting: Family Medicine

## 2021-08-24 ENCOUNTER — Telehealth: Payer: Self-pay | Admitting: Family Medicine

## 2021-08-24 ENCOUNTER — Other Ambulatory Visit: Payer: Self-pay | Admitting: *Deleted

## 2021-08-24 DIAGNOSIS — I10 Essential (primary) hypertension: Secondary | ICD-10-CM

## 2021-08-24 DIAGNOSIS — Z79899 Other long term (current) drug therapy: Secondary | ICD-10-CM

## 2021-08-24 DIAGNOSIS — E118 Type 2 diabetes mellitus with unspecified complications: Secondary | ICD-10-CM

## 2021-08-24 MED ORDER — METFORMIN HCL 500 MG PO TABS: ORAL_TABLET | ORAL | 5 refills | Status: DC

## 2021-08-24 NOTE — Telephone Encounter (Signed)
Patient  checking on prescription for metformin that was supposed to be called in 5/2 and his last appointment to [facility] ?

## 2021-08-24 NOTE — Telephone Encounter (Signed)
Metformin 500 mg 1 taken twice daily, #60, 5 refills ? ?Please inform [name] starting off he should do 1/2 tablet twice daily for the first week to allow his system to get used to it is not unusual occasionally in some people can have gas or loose stools or slight nausea after the first week I would increase it to a full tablet twice a day also healthy diet also metabolic 7 and G8T in 3 months with follow-up office visit at that time ?

## 2021-08-24 NOTE — Telephone Encounter (Signed)
Patient informed of md message and instructions. Pt verbalized understanding.  Patient also wanted to know if you had a chance to talk with Dr. Harl Bowie. ?

## 2021-08-25 ENCOUNTER — Telehealth: Payer: Self-pay | Admitting: Cardiology

## 2021-08-25 NOTE — Telephone Encounter (Signed)
Message left for Dr Harl Bowie to call cellphone tomorrow ?

## 2021-08-25 NOTE — Telephone Encounter (Signed)
New Message: ? ? ? ? ?Dr Wolfgang Phoenix would like for Dr Harl Bowie to call him tomorrow(08-26-21) about this patient please, ?

## 2021-08-25 NOTE — Telephone Encounter (Signed)
Nurses-please touch base with cardiology to see if on Friday if Dr. Harl Bowie could briefly call me on my cell number [phone] ?Please let cardiology know that I sent Dr. Harl Bowie a staff message regarding this patient-that would give him a heads up on what concerns the patient and myself has.  Thank you ? ?Let patient know we have reached out and hopefully will hear back quickly thank you ?

## 2021-08-26 ENCOUNTER — Telehealth: Payer: Self-pay | Admitting: *Deleted

## 2021-08-26 ENCOUNTER — Encounter: Payer: Self-pay | Admitting: *Deleted

## 2021-08-26 NOTE — Telephone Encounter (Signed)
Nurses-please send [name] a MyChart message-we are waiting to hear back from Dr. Harl Bowie thank you ?

## 2021-08-26 NOTE — Telephone Encounter (Signed)
Mychart message sent to patient.

## 2021-08-26 NOTE — Telephone Encounter (Signed)
Call pt at MD request to schedule virtual appt on Monday at 2:20 pm. No answer. Voicemail is full.  ?

## 2021-08-29 ENCOUNTER — Telehealth: Payer: Self-pay

## 2021-08-29 ENCOUNTER — Ambulatory Visit (INDEPENDENT_AMBULATORY_CARE_PROVIDER_SITE_OTHER): Payer: Medicare Other | Admitting: Cardiology

## 2021-08-29 ENCOUNTER — Other Ambulatory Visit: Payer: Self-pay | Admitting: Cardiology

## 2021-08-29 ENCOUNTER — Encounter: Payer: Self-pay | Admitting: Cardiology

## 2021-08-29 VITALS — BP 141/75 | HR 60 | Ht 71.0 in | Wt 227.0 lb

## 2021-08-29 DIAGNOSIS — Z01818 Encounter for other preprocedural examination: Secondary | ICD-10-CM | POA: Diagnosis not present

## 2021-08-29 DIAGNOSIS — I25119 Atherosclerotic heart disease of native coronary artery with unspecified angina pectoris: Secondary | ICD-10-CM | POA: Diagnosis not present

## 2021-08-29 DIAGNOSIS — R079 Chest pain, unspecified: Secondary | ICD-10-CM

## 2021-08-29 MED ORDER — SODIUM CHLORIDE 0.9% FLUSH: 3.0000 mL | Freq: Two times a day (BID) | INTRAVENOUS | Status: AC

## 2021-08-29 NOTE — Addendum Note (Signed)
Addended by: Barbarann Ehlers A on: 08/29/2021 02:29 PM ? ? Modules accepted: Orders ? ?

## 2021-08-29 NOTE — H&P (View-Only) (Signed)
Virtual Visit via Telephone Note   This visit type was conducted due to national recommendations for restrictions regarding the COVID-19 Pandemic (e.g. social distancing) in an effort to limit this patient's exposure and mitigate transmission in our community.  Due to his co-morbid illnesses, this patient is at least at moderate risk for complications without adequate follow up.  This format is felt to be most appropriate for this patient at this time.  The patient did not have access to video technology/had technical difficulties with video requiring transitioning to audio format only (telephone).  All issues noted in this document were discussed and addressed.  No physical exam could be performed with this format.  Please refer to the patient's chart for his  consent to telehealth for [facility]    Date:  08/29/2021   ID:  ARIANNA DELSANTO, DOB 08-17-1955, MRN [id number] The patient was identified using 2 identifiers.  Patient Location: Home Provider Location: Office/Clinic   PCP:  Kathyrn Drown, MD   [place] [facility] Cardiologist:  Carlyle Dolly, MD Electrophysiologist:  Thompson Grayer, MD     Evaluation Performed:  Follow-Up Visit  Chief Complaint:  Chest pain  History of Present Illness:    JASHAN COTTEN is a 66 y.o. male seen today for a focused visit for symptoms of chest pain and palpitations.   1. CAD - history of DES to LAD in 08/2018, PCI to RCA in 2014   06/2019 echo: LVEF 60-65%, no WMAs, grade I DDx,  - 09/2019 nuclear stress: small apical infarct with mild peri-infarct ischemia   06/2020 cath: prox LAD 50%, patent LAD stent, LCX 85% distal, RCA 75% with stent ISR. Had PTCA of RCA ISR. From interventional could hold plavix after 30 days if needed       - last visit some fatigue, dizziness, SOB. We lowered his hydralazine to '25mg'$  bid and lowered imdur to '120mg'$  daily.      - recent chest pain and SOB - started ranexa - recent increase in  SOB/DOE, exertional chest pains. Reports similar to prior blockage symptoms - tightness in chest walking up driveway about half way, has to stop now. Symptoms improve with rest        2.Palpitations - feeling of skipping heart beat, associated with pressure in chest and into neck. - increase in frequency recently.  - rare caffeine, rare EtOH  07/2021 minotor; frequent PACs (9.1%), rare PVCs. 6 beat run of NSVT.   - symptoms improved since last visit          Other medical issues not addressed this visit   2. HTN - he is compliant with meds   - home bp's 100s-140s.    3. Complete heart block - pacemaker followed by Dr Rayann Heman, CRT-P - normal device check Jan 2023    07/2021 device check 2 NSVT 8 and 9 beats.     4. Hyperlipidemia    06/2020 TC 119 HDL 52 TG 113 LDL 47  02/2021 TC 132 TG 73 HDL 58 LDL 59     5. Orthostastic hypotension - can have lightheadedness with standing - she reports SBP 120-->110   SH; just sold his house, was able to get a great price and only on market for a week. Living in a fifth wheel for the time being.  Recently bought new home.   Past Medical History:  Diagnosis Date   ADD (attention deficit disorder)    Rx-Adderall   Anginal pain ([place])  Aortic atherosclerosis ([place]) 05/27/2020   Seen on x-ray being treated with statin   AV block, 3rd degree ([place])    Colon polyps 2011   tubular adenoma by excisional biopsy during colonoscopy   Complete heart block ([place])    a. s/p PPM Placement in 12/2012 with Medtronic CRT-P placement in 03/2014   Coronary artery disease    a. s/p PCI of the RCA in 2014 b. 08/2018: patent RCA stent with new mid-LAD 85% stenosis (treated with PCI/DES) and 85% distal OM2 stenosis (med management recommended)   Degenerative disc disease, cervical    Gastrointestinal bleeding 10/15/2017   GERD (gastroesophageal reflux disease)    Hypertension    Pacemaker    Palpitations 2003   Holter in 2003: PACs and PVCs;  negative stress nuclear test in 2005; right bundle Dajane Valli block; echo in 2008-mild LVH, otherwise normal; 2008-negative stress nuclear test.   Pneumonia    Prostatitis    prostate calcifications by CT   Sleep apnea    questionable diagnosis   Past Surgical History:  Procedure Laterality Date   ANTERIOR CERVICAL DECOMP/DISCECTOMY FUSION     BACK SURGERY     Biventricular pacemaker upgrade/ system revision  04/02/14   removal of previous atrial and ventricular leads with placement of a new MDT Consulta CRT-P system at [facility] by Dr Violet Baldy   COLONOSCOPY     COLONOSCOPY W/ POLYPECTOMY  2011   tubular adenoma   CORONARY BALLOON ANGIOPLASTY N/A 06/15/2020   Procedure: [place];  Surgeon: Jettie Booze, MD;  Location: [place] CV LAB;  Service: Cardiovascular;  Laterality: N/A;   CORONARY STENT INTERVENTION N/A 08/22/2018   Procedure: CORONARY STENT INTERVENTION;  Surgeon: Troy Sine, MD;  Location: [place] CV LAB;  Service: Cardiovascular;  Laterality: N/A;   ESOPHAGOGASTRODUODENOSCOPY     Gastritis   INSERT / REPLACE / REMOVE PACEMAKER  12/17/2012   MDT Adapta L implanted by Dr Rayann Heman for mobitz II second degree AV block   INTRAVASCULAR ULTRASOUND/IVUS N/A 06/15/2020   Procedure: Intravascular Ultrasound/IVUS;  Surgeon: Jettie Booze, MD;  Location: [place] CV LAB;  Service: Cardiovascular;  Laterality: N/A;   KNEE SURGERY Right    LEFT HEART CATH AND CORONARY ANGIOGRAPHY N/A 08/22/2018   Procedure: LEFT HEART CATH AND CORONARY ANGIOGRAPHY;  Surgeon: Troy Sine, MD;  Location: [place] CV LAB;  Service: Cardiovascular;  Laterality: N/A;   LEFT HEART CATH AND CORONARY ANGIOGRAPHY N/A 06/15/2020   Procedure: LEFT HEART CATH AND CORONARY ANGIOGRAPHY;  Surgeon: Jettie Booze, MD;  Location: [place] CV LAB;  Service: Cardiovascular;  Laterality: N/A;   LEFT HEART CATHETERIZATION WITH CORONARY ANGIOGRAM N/A 10/17/2012    Procedure: LEFT HEART CATHETERIZATION WITH CORONARY ANGIOGRAM;  Surgeon: Josue Hector, MD;  Location: [facility] CATH LAB;  Service: Cardiovascular;  Laterality: N/A;   LEFT HEART CATHETERIZATION WITH CORONARY ANGIOGRAM N/A 12/17/2012   Procedure: LEFT HEART CATHETERIZATION WITH CORONARY ANGIOGRAM;  Surgeon: Peter M Martinique, MD;  Location: [facility] CATH LAB;  Service: Cardiovascular;  Laterality: N/A;   LEFT HEART CATHETERIZATION WITH CORONARY ANGIOGRAM N/A 06/11/2013   Procedure: LEFT HEART CATHETERIZATION WITH CORONARY ANGIOGRAM;  Surgeon: Wellington Hampshire, MD;  Location: [place] CATH LAB;  Service: Cardiovascular;  Laterality: N/A;   PERCUTANEOUS CORONARY STENT INTERVENTION (PCI-S)  10/17/2012   Procedure: PERCUTANEOUS CORONARY STENT INTERVENTION (PCI-S);  Surgeon: Josue Hector, MD;  Location: [facility] CATH LAB;  Service: Cardiovascular;;   PERMANENT PACEMAKER INSERTION N/A  12/17/2012   Procedure: PERMANENT PACEMAKER INSERTION;  Surgeon: Thompson Grayer, MD;  Location: [facility] CATH LAB;  Service: Cardiovascular;  Laterality: N/A;   POLYPECTOMY     UPPER GASTROINTESTINAL ENDOSCOPY       No outpatient medications have been marked as taking for the 08/29/21 encounter (Appointment) with Arnoldo Lenis, MD.     Allergies:   Morphine and related, Lasix [furosemide], and Latex   Social History   Tobacco Use   Smoking status: Former    Packs/day: 2.50    Years: 40.00    Pack years: 100.00    Types: Cigarettes    Start date: 04/18/1967    Quit date: 12/20/2003    Years since quitting: 17.7   Smokeless tobacco: Former  Scientific laboratory technician Use: Former  Substance Use Topics   Alcohol use: Yes    Alcohol/week: 1.0 standard drink    Types: 1 Cans of beer per week   Drug use: Not Currently    Types: Marijuana    Comment: 30 + years ago - "crank, marjiuanna, ect."     Family Hx: The patient's family history includes Breast cancer in his maternal aunt; Heart disease in his mother; Hypertension in his mother; Prostate cancer  in his brother. There is no history of Colon cancer or Colon polyps.  ROS:   Please see the history of present illness.     All other systems reviewed and are negative.   Prior CV studies:   The following studies were reviewed today:   Labs/Other Tests and Data Reviewed:    EKG:  n/a  Recent Labs: 12/14/2020: TSH 1.730 07/21/2021: B Natriuretic Peptide 71.0; BUN 19; Creatinine, Ser 1.10; Potassium 4.2; Sodium 140 08/17/2021: Hemoglobin 15.8; Platelets 209   Recent Lipid Panel Lab Results  Component Value Date/Time   CHOL 132 03/09/2021 10:23 AM   TRIG 73 03/09/2021 10:23 AM   HDL 58 03/09/2021 10:23 AM   CHOLHDL 2.3 03/09/2021 10:23 AM   CHOLHDL 2.3 06/23/2020 10:10 AM   LDLCALC 59 03/09/2021 10:23 AM   LDLCALC 47 06/23/2020 10:10 AM    Wt Readings from Last 3 Encounters:  08/16/21 233 lb (105.7 kg)  07/27/21 233 lb 9.6 oz (106 kg)  07/21/21 233 lb 6.4 oz (105.9 kg)     Risk Assessment/Calculations:         Objective:    Vital Signs:   Today's Vitals   08/29/21 1325  BP: (!) 141/75  Pulse: 60  Weight: 227 lb (103 kg)  Height: '5\' 11"'$  (1.803 m)   Body mass index is 31.66 kg/m. Normal affect. Normal speech pattern and tone. Comfortable, no apparent distress. No audible signs of sob or wheezing.  ASSESSMENT & PLAN:    1.CAD with unspecified angin -recent progressing exertional DOE and chest pain. He reports symptoms similar to when he had prior blockages - he is on 4 antianginals with ongoign symptoms - plan for heart catheterization to further evaluate.   2. Palpitations - recent monitor with primarily PACs - symptoms have improved since last visit, he is on nadolol      Shared Decision Making/Informed Consent The risks [stroke (1 in 1000), death (1 in 1000), kidney failure [usually temporary] (1 in 500), bleeding (1 in 200), allergic reaction [possibly serious] (1 in 200)], benefits (diagnostic support and management of coronary artery disease) and  alternatives of a cardiac catheterization were discussed in detail with Mr. Leverette and he is willing to proceed.  Time:   Today, I have spent 15 minutes with the patient with telehealth technology discussing the above problems.     Medication Adjustments/Labs and Tests Ordered: Current medicines are reviewed at length with the patient today.  Concerns regarding medicines are outlined above.   Tests Ordered: No orders of the defined types were placed in this encounter.   Medication Changes: No orders of the defined types were placed in this encounter.   Follow Up:  In Person 2 months  Signed, Carlyle Dolly, MD  08/29/2021 12:00 PM    [place]

## 2021-08-29 NOTE — Patient Instructions (Signed)
Medication Instructions:  ?Your physician recommends that you continue on your current medications as directed. Please refer to the Current Medication list given to you today. ? ? ?Labwork: ?BMET at [place] already done ? ?Testing/Procedures: ?Your physician has requested that you have a cardiac catheterization. Cardiac catheterization is used to diagnose and/or treat various heart conditions. Doctors may recommend this procedure for a number of different reasons. The most common reason is to evaluate chest pain. Chest pain can be a symptom of coronary artery disease (CAD), and cardiac catheterization can show whether plaque is narrowing or blocking your heart?s arteries. This procedure is also used to evaluate the valves, as well as measure the blood flow and oxygen levels in different parts of your heart. For further information please visit [email or web address]. Please follow instruction sheet, as given. ? ? ?Follow-Up: ? 2 months ? ?Any Other Special Instructions Will Be Listed Below (If Applicable). ? ?If you need a refill on your cardiac medications before your next appointment, please call your pharmacy. ? ?

## 2021-08-29 NOTE — Telephone Encounter (Signed)
?  Patient Consent for Virtual Visit  ? ? ?   ? ?[name] has provided verbal consent on 08/29/2021 for a virtual visit (video or telephone). ? ? ?CONSENT FOR VIRTUAL VISIT FOR:  [name]  ?By participating in this virtual visit I agree to the following: ? ?I hereby voluntarily request, consent and authorize [facility] and its employed or contracted physicians, physician assistants, nurse practitioners or other licensed health care professionals (the Practitioner), to provide me with telemedicine health care services (the ?Services") as deemed necessary by the treating Practitioner. I acknowledge and consent to receive the Services by the Practitioner via telemedicine. I understand that the telemedicine visit will involve communicating with the Practitioner through live audiovisual communication technology and the disclosure of certain medical information by electronic transmission. I acknowledge that I have been given the opportunity to request an in-person assessment or other available alternative prior to the telemedicine visit and am voluntarily participating in the telemedicine visit. ? ?I understand that I have the right to withhold or withdraw my consent to the use of telemedicine in the course of my care at any time, without affecting my right to future care or treatment, and that the Practitioner or I may terminate the telemedicine visit at any time. I understand that I have the right to inspect all information obtained and/or recorded in the course of the telemedicine visit and may receive copies of available information for a reasonable fee.  I understand that some of the potential risks of receiving the Services via telemedicine include:  ?Delay or interruption in medical evaluation due to technological equipment failure or disruption; ?Information transmitted may not be sufficient (e.g. poor resolution of images) to allow for appropriate medical decision making by the Practitioner;  and/or  ?In rare instances, security protocols could fail, causing a breach of personal health information. ? ?Furthermore, I acknowledge that it is my responsibility to provide information about my medical history, conditions and care that is complete and accurate to the best of my ability. I acknowledge that Practitioner's advice, recommendations, and/or decision may be based on factors not within their control, such as incomplete or inaccurate data provided by me or distortions of diagnostic images or specimens that may result from electronic transmissions. I understand that the practice of medicine is not an exact science and that Practitioner makes no warranties or guarantees regarding treatment outcomes. I acknowledge that a copy of this consent can be made available to me via my patient portal ([place]), or I can request a printed copy by calling the office of [facility].   ? ?I understand that my insurance will be billed for this visit.  ? ?I have read or had this consent read to me. ?I understand the contents of this consent, which adequately explains the benefits and risks of the Services being provided via telemedicine.  ?I have been provided ample opportunity to ask questions regarding this consent and the Services and have had my questions answered to my satisfaction. ?I give my informed consent for the services to be provided through the use of telemedicine in my medical care ? ? ? ?

## 2021-08-29 NOTE — Progress Notes (Signed)
? ?Virtual Visit via Telephone Note  ? ?This visit type was conducted due to national recommendations for restrictions regarding the COVID-19 Pandemic (e.g. social distancing) in an effort to limit this patient's exposure and mitigate transmission in our community.  Due to his co-morbid illnesses, this patient is at least at moderate risk for complications without adequate follow up.  This format is felt to be most appropriate for this patient at this time.  The patient did not have access to video technology/had technical difficulties with video requiring transitioning to audio format only (telephone).  All issues noted in this document were discussed and addressed.  No physical exam could be performed with this format.  Please refer to the patient's chart for his  consent to telehealth for [facility]  ? ? ?Date:  08/29/2021  ? ?ID:  [name], DOB 06/07/1955, MRN [id number] ?The patient was identified using 2 identifiers. ? ?Patient Location: Home ?Provider Location: Office/Clinic ? ? ?PCP:  Kathyrn Drown, MD ?  ?[facility] ?Cardiologist:  Carlyle Dolly, MD ?Electrophysiologist:  Thompson Grayer, MD    ? ?Evaluation Performed:  Follow-Up Visit ? ?Chief Complaint:  Chest pain ? ?History of Present Illness:   ? ?[name] is a 66 y.o. male seen today for a focused visit for symptoms of chest pain and palpitations.  ? ?1. CAD ?- history of DES to LAD in 08/2018, PCI to RCA in 2014 ?  ?06/2019 echo: LVEF 60-65%, no WMAs, grade I DDx,  ?- 09/2019 nuclear stress: small apical infarct with mild peri-infarct ischemia ?  ?06/2020 cath: prox LAD 50%, patent LAD stent, LCX 85% distal, RCA 75% with stent ISR. Had PTCA of RCA ISR. From interventional could hold plavix after 30 days if needed ?  ?  ?  ?- last visit some fatigue, dizziness, SOB. We lowered his hydralazine to '25mg'$  bid and lowered imdur to '120mg'$  daily.  ?  ?  ?- recent chest pain and SOB ?- started ranexa ?- recent increase in  SOB/DOE, exertional chest pains. Reports similar to prior blockage symptoms ?- tightness in chest walking up driveway about half way, has to stop now. Symptoms improve with rest ? ?  ?  ?  ?2.Palpitations ?- feeling of skipping heart beat, associated with pressure in chest and into neck. ?- increase in frequency recently.  ?- rare caffeine, rare EtOH ? ?07/2021 minotor; frequent PACs (9.1%), rare PVCs. 6 beat run of NSVT.  ? ?- symptoms improved since last visit ? ? ? ?  ?  ?  ?Other medical issues not addressed this visit ?  ?2. HTN ?- he is compliant with meds ?  ?- home bp's 100s-140s.  ?  ?3. Complete heart block ?- pacemaker followed by Dr Rayann Heman, CRT-P ?- normal device check Jan 2023 ?  ? 07/2021 device check 2 NSVT 8 and 9 beats. ?  ?  ?4. Hyperlipidemia ?  ? 06/2020 TC 119 HDL 52 TG 113 LDL 47 ? 02/2021 TC 132 TG 73 HDL 58 LDL 59 ?  ?  ?5. Orthostastic hypotension ?- can have lightheadedness with standing ?- she reports SBP 120-->110 ?  ?SH; just sold his house, was able to get a great price and only on market for a week. Living in a fifth wheel for the time being.  ?Recently bought new home.  ? ?Past Medical History:  ?Diagnosis Date  ? ADD (attention deficit disorder)   ? Rx-Adderall  ? Anginal pain ([place])   ?  Aortic atherosclerosis ([place]) 05/27/2020  ? Seen on x-ray being treated with statin  ? AV block, 3rd degree ([facility])   ? Colon polyps 2011  ? tubular adenoma by excisional biopsy during colonoscopy  ? Complete heart block ([place])   ? a. s/p PPM Placement in 12/2012 with Medtronic CRT-P placement in 03/2014  ? Coronary artery disease   ? a. s/p PCI of the RCA in 2014 b. 08/2018: patent RCA stent with new mid-LAD 85% stenosis (treated with PCI/DES) and 85% distal OM2 stenosis (med management recommended)  ? Degenerative disc disease, cervical   ? Gastrointestinal bleeding 10/15/2017  ? GERD (gastroesophageal reflux disease)   ? Hypertension   ? Pacemaker   ? Palpitations 2003  ? Holter in 2003: PACs and PVCs;  negative stress nuclear test in 2005; right bundle Shelvia Fojtik block; echo in 2008-mild LVH, otherwise normal; 2008-negative stress nuclear test.  ? Pneumonia   ? Prostatitis   ? prostate calcifications by CT  ? Sleep apnea   ? questionable diagnosis  ? ?Past Surgical History:  ?Procedure Laterality Date  ? ANTERIOR CERVICAL DECOMP/DISCECTOMY FUSION    ? BACK SURGERY    ? Biventricular pacemaker upgrade/ system revision  04/02/14  ? removal of previous atrial and ventricular leads with placement of a new MDT Consulta CRT-P system at [facility] by Dr Violet Baldy  ? COLONOSCOPY    ? COLONOSCOPY W/ POLYPECTOMY  2011  ? tubular adenoma  ? CORONARY BALLOON ANGIOPLASTY N/A 06/15/2020  ? Procedure: CORONARY BALLOON ANGIOPLASTY;  Surgeon: Jettie Booze, MD;  Location: [place] CV LAB;  Service: Cardiovascular;  Laterality: N/A;  ? CORONARY STENT INTERVENTION N/A 08/22/2018  ? Procedure: CORONARY STENT INTERVENTION;  Surgeon: Troy Sine, MD;  Location: [place] CV LAB;  Service: Cardiovascular;  Laterality: N/A;  ? ESOPHAGOGASTRODUODENOSCOPY    ? Gastritis  ? INSERT / REPLACE / REMOVE PACEMAKER  12/17/2012  ? MDT Adapta L implanted by Dr Rayann Heman for mobitz II second degree AV block  ? INTRAVASCULAR ULTRASOUND/IVUS N/A 06/15/2020  ? Procedure: Intravascular Ultrasound/IVUS;  Surgeon: Jettie Booze, MD;  Location: [place] CV LAB;  Service: Cardiovascular;  Laterality: N/A;  ? KNEE SURGERY Right   ? LEFT HEART CATH AND CORONARY ANGIOGRAPHY N/A 08/22/2018  ? Procedure: LEFT HEART CATH AND CORONARY ANGIOGRAPHY;  Surgeon: Troy Sine, MD;  Location: [place] CV LAB;  Service: Cardiovascular;  Laterality: N/A;  ? LEFT HEART CATH AND CORONARY ANGIOGRAPHY N/A 06/15/2020  ? Procedure: LEFT HEART CATH AND CORONARY ANGIOGRAPHY;  Surgeon: Jettie Booze, MD;  Location: [place] CV LAB;  Service: Cardiovascular;  Laterality: N/A;  ? LEFT HEART CATHETERIZATION WITH CORONARY ANGIOGRAM N/A 10/17/2012  ?  Procedure: LEFT HEART CATHETERIZATION WITH CORONARY ANGIOGRAM;  Surgeon: Josue Hector, MD;  Location: [facility] CATH LAB;  Service: Cardiovascular;  Laterality: N/A;  ? LEFT HEART CATHETERIZATION WITH CORONARY ANGIOGRAM N/A 12/17/2012  ? Procedure: LEFT HEART CATHETERIZATION WITH CORONARY ANGIOGRAM;  Surgeon: Peter M Martinique, MD;  Location: [facility] CATH LAB;  Service: Cardiovascular;  Laterality: N/A;  ? LEFT HEART CATHETERIZATION WITH CORONARY ANGIOGRAM N/A 06/11/2013  ? Procedure: LEFT HEART CATHETERIZATION WITH CORONARY ANGIOGRAM;  Surgeon: Wellington Hampshire, MD;  Location: [place] CATH LAB;  Service: Cardiovascular;  Laterality: N/A;  ? PERCUTANEOUS CORONARY STENT INTERVENTION (PCI-S)  10/17/2012  ? Procedure: PERCUTANEOUS CORONARY STENT INTERVENTION (PCI-S);  Surgeon: Josue Hector, MD;  Location: [facility] CATH LAB;  Service: Cardiovascular;;  ? PERMANENT PACEMAKER INSERTION N/A  12/17/2012  ? Procedure: PERMANENT PACEMAKER INSERTION;  Surgeon: Thompson Grayer, MD;  Location[facility] CATH LAB;  Service: Cardiovascular;  Laterality: N/A;  ? POLYPECTOMY    ? UPPER GASTROINTESTINAL ENDOSCOPY    ?  ? ?No outpatient medications have been marked as taking for the 08/29/21 encounter (Appointment) with Arnoldo Lenis, MD.  ?  ? ?Allergies:   Morphine and related, Lasix [furosemide], and Latex  ? ?Social History  ? ?Tobacco Use  ? Smoking status: Former  ?  Packs/day: 2.50  ?  Years: 40.00  ?  Pack years: 100.00  ?  Types: Cigarettes  ?  Start date: 04/18/1967  ?  Quit date: 12/20/2003  ?  Years since quitting: 17.7  ? Smokeless tobacco: Former  ?Vaping Use  ? Vaping Use: Former  ?Substance Use Topics  ? Alcohol use: Yes  ?  Alcohol/week: 1.0 standard drink  ?  Types: 1 Cans of beer per week  ? Drug use: Not Currently  ?  Types: Marijuana  ?  Comment: 30 + years ago - "crank, marjiuanna, ect."  ?  ? ?Family Hx: ?The patient's family history includes Breast cancer in his maternal aunt; Heart disease in his mother; Hypertension in his mother; Prostate cancer  in his brother. There is no history of Colon cancer or Colon polyps. ? ?ROS:   ?Please see the history of present illness.    ? ?All other systems reviewed and are negative. ? ? ?Prior CV studies:   ?The followi

## 2021-08-30 ENCOUNTER — Other Ambulatory Visit (HOSPITAL_COMMUNITY)
Admission: RE | Admit: 2021-08-30 | Discharge: 2021-08-30 | Disposition: A | Discharge status: Home / Self Care | Payer: Medicare Other | Source: Ambulatory Visit | Attending: Cardiology | Admitting: Cardiology

## 2021-08-30 DIAGNOSIS — I25119 Atherosclerotic heart disease of native coronary artery with unspecified angina pectoris: Secondary | ICD-10-CM | POA: Insufficient documentation

## 2021-08-30 DIAGNOSIS — Z01818 Encounter for other preprocedural examination: Secondary | ICD-10-CM | POA: Diagnosis not present

## 2021-08-30 LAB — BASIC METABOLIC PANEL
Anion gap: 7 (ref 5–15)
BUN: 22 mg/dL (ref 8–23)
CO2: 27 mmol/L (ref 22–32)
Calcium: 9.6 mg/dL (ref 8.9–10.3)
Chloride: 105 mmol/L (ref 98–111)
Creatinine, Ser: 1.26 mg/dL — ABNORMAL HIGH (ref 0.61–1.24)
GFR, Estimated: >60 mL/min (ref >60)
Glucose, Bld: 208 mg/dL — ABNORMAL HIGH (ref 70–99)
Potassium: 4.4 mmol/L (ref 3.5–5.1)
Sodium: 139 mmol/L (ref 135–145)

## 2021-08-30 NOTE — Telephone Encounter (Signed)
Patient was seen by Dr. Harl Bowie ?

## 2021-08-31 ENCOUNTER — Telehealth: Payer: Self-pay | Admitting: *Deleted

## 2021-08-31 NOTE — Telephone Encounter (Addendum)
Cardiac Catheterization scheduled at [facility] for: Thursday Sep 01, 2021 10:30 AM ?Arrival time and place: [place] Entrance A at: 8:30 AM ? ? ?Nothing to eat after midnight prior to procedure, clear liquids until 5 AM day of procedure. ? ?Medication instructions: ?-Hold: ? Metformin-day of procedure and 48 hours post procedure ?-Except hold medications usual morning medications can be taken with sips of water including aspirin 81 mg. ? ?Confirmed patient has responsible adult to drive home post procedure and be with patient first 24 hours after arriving home. ? ?Patient reports no new symptoms concerning for COVID-19/no exposure to COVID-19 in the past 10 days. ? ?Reviewed procedure instructions with patient.  ? ?

## 2021-09-01 ENCOUNTER — Encounter (HOSPITAL_COMMUNITY)
Admission: RE | Disposition: A | Discharge status: Home / Self Care | Payer: Self-pay | Source: Home / Self Care | Attending: Cardiovascular Disease

## 2021-09-01 ENCOUNTER — Other Ambulatory Visit: Payer: Self-pay

## 2021-09-01 ENCOUNTER — Ambulatory Visit (HOSPITAL_COMMUNITY)
Admission: RE | Admit: 2021-09-01 | Discharge: 2021-09-01 | Disposition: A | Discharge status: Home / Self Care | Payer: Medicare Other | Attending: Cardiovascular Disease | Admitting: Cardiovascular Disease

## 2021-09-01 ENCOUNTER — Encounter (HOSPITAL_COMMUNITY): Payer: Self-pay | Admitting: Cardiovascular Disease

## 2021-09-01 DIAGNOSIS — R079 Chest pain, unspecified: Secondary | ICD-10-CM | POA: Diagnosis present

## 2021-09-01 DIAGNOSIS — Z7902 Long term (current) use of antithrombotics/antiplatelets: Secondary | ICD-10-CM | POA: Insufficient documentation

## 2021-09-01 DIAGNOSIS — E785 Hyperlipidemia, unspecified: Secondary | ICD-10-CM | POA: Insufficient documentation

## 2021-09-01 DIAGNOSIS — Z87891 Personal history of nicotine dependence: Secondary | ICD-10-CM | POA: Insufficient documentation

## 2021-09-01 DIAGNOSIS — Z79899 Other long term (current) drug therapy: Secondary | ICD-10-CM | POA: Insufficient documentation

## 2021-09-01 DIAGNOSIS — Z950 Presence of cardiac pacemaker: Secondary | ICD-10-CM | POA: Insufficient documentation

## 2021-09-01 DIAGNOSIS — I25119 Atherosclerotic heart disease of native coronary artery with unspecified angina pectoris: Secondary | ICD-10-CM | POA: Diagnosis not present

## 2021-09-01 DIAGNOSIS — I10 Essential (primary) hypertension: Secondary | ICD-10-CM | POA: Insufficient documentation

## 2021-09-01 DIAGNOSIS — Z955 Presence of coronary angioplasty implant and graft: Secondary | ICD-10-CM | POA: Diagnosis not present

## 2021-09-01 DIAGNOSIS — E119 Type 2 diabetes mellitus without complications: Secondary | ICD-10-CM | POA: Diagnosis not present

## 2021-09-01 DIAGNOSIS — I2510 Atherosclerotic heart disease of native coronary artery without angina pectoris: Secondary | ICD-10-CM | POA: Diagnosis not present

## 2021-09-01 DIAGNOSIS — R002 Palpitations: Secondary | ICD-10-CM | POA: Insufficient documentation

## 2021-09-01 DIAGNOSIS — I442 Atrioventricular block, complete: Secondary | ICD-10-CM | POA: Diagnosis not present

## 2021-09-01 DIAGNOSIS — I959 Hypotension, unspecified: Secondary | ICD-10-CM | POA: Diagnosis not present

## 2021-09-01 HISTORY — PX: LEFT HEART CATH AND CORONARY ANGIOGRAPHY: CATH118249

## 2021-09-01 LAB — GLUCOSE, CAPILLARY: Glucose-Capillary: 146 mg/dL — ABNORMAL HIGH (ref 70–99)

## 2021-09-01 SURGERY — LEFT HEART CATH AND CORONARY ANGIOGRAPHY
Anesthesia: LOCAL

## 2021-09-01 MED ORDER — SODIUM CHLORIDE 0.9% FLUSH: 3.0000 mL | INTRAVENOUS | Status: DC | PRN

## 2021-09-01 MED ORDER — MIDAZOLAM HCL 2 MG/2ML IJ SOLN
INTRAMUSCULAR | Status: DC | PRN
Start: 2021-09-01 — End: 2021-09-01
  Administered 2021-09-01: 1 mg via INTRAVENOUS
  Administered 2021-09-01: 2 mg via INTRAVENOUS

## 2021-09-01 MED ORDER — SODIUM CHLORIDE 0.9 % IV SOLN: INTRAVENOUS | Status: DC

## 2021-09-01 MED ORDER — MIDAZOLAM HCL 2 MG/2ML IJ SOLN
INTRAMUSCULAR | Status: AC
  Filled 2021-09-01: qty 2

## 2021-09-01 MED ORDER — HEPARIN (PORCINE) IN NACL 1000-0.9 UT/500ML-% IV SOLN
INTRAVENOUS | Status: AC
  Filled 2021-09-01: qty 1000

## 2021-09-01 MED ORDER — FENTANYL CITRATE (PF) 100 MCG/2ML IJ SOLN
INTRAMUSCULAR | Status: AC
  Filled 2021-09-01: qty 2

## 2021-09-01 MED ORDER — ONDANSETRON HCL 4 MG/2ML IJ SOLN: 4.0000 mg | Freq: Four times a day (QID) | INTRAMUSCULAR | Status: DC | PRN

## 2021-09-01 MED ORDER — SODIUM CHLORIDE 0.9 % IV SOLN: 250.0000 mL | INTRAVENOUS | Status: DC | PRN

## 2021-09-01 MED ORDER — SODIUM CHLORIDE 0.9% FLUSH: 3.0000 mL | Freq: Two times a day (BID) | INTRAVENOUS | Status: DC

## 2021-09-01 MED ORDER — LABETALOL HCL 5 MG/ML IV SOLN: 10.0000 mg | INTRAVENOUS | Status: DC | PRN

## 2021-09-01 MED ORDER — SODIUM CHLORIDE 0.9 % WEIGHT BASED INFUSION
3.0000 mL/kg/h | INTRAVENOUS | Status: AC
  Administered 2021-09-01: 3 mL/kg/h via INTRAVENOUS

## 2021-09-01 MED ORDER — FENTANYL CITRATE (PF) 100 MCG/2ML IJ SOLN
INTRAMUSCULAR | Status: DC | PRN
  Administered 2021-09-01: 50 ug via INTRAVENOUS

## 2021-09-01 MED ORDER — ACETAMINOPHEN 325 MG PO TABS: 650.0000 mg | ORAL_TABLET | ORAL | Status: DC | PRN

## 2021-09-01 MED ORDER — HEPARIN (PORCINE) IN NACL 1000-0.9 UT/500ML-% IV SOLN
INTRAVENOUS | Status: DC | PRN
Start: 2021-09-01 — End: 2021-09-01
  Administered 2021-09-01 (×2): 500 mL

## 2021-09-01 MED ORDER — DIAZEPAM 5 MG PO TABS: 5.0000 mg | ORAL_TABLET | ORAL | Status: DC | PRN

## 2021-09-01 MED ORDER — ASPIRIN 81 MG PO CHEW: 81.0000 mg | CHEWABLE_TABLET | Freq: Every day | ORAL | Status: DC

## 2021-09-01 MED ORDER — LIDOCAINE HCL (PF) 1 % IJ SOLN
INTRAMUSCULAR | Status: AC
  Filled 2021-09-01: qty 30

## 2021-09-01 MED ORDER — HYDRALAZINE HCL 20 MG/ML IJ SOLN: 10.0000 mg | INTRAMUSCULAR | Status: DC | PRN

## 2021-09-01 MED ORDER — ASPIRIN 81 MG PO CHEW: 81.0000 mg | CHEWABLE_TABLET | ORAL | Status: DC

## 2021-09-01 MED ORDER — IOHEXOL 350 MG/ML SOLN
INTRAVENOUS | Status: DC | PRN
  Administered 2021-09-01: 80 mL via INTRA_ARTERIAL

## 2021-09-01 MED ORDER — LIDOCAINE HCL (PF) 1 % IJ SOLN
INTRAMUSCULAR | Status: DC | PRN
  Administered 2021-09-01: 15 mL

## 2021-09-01 MED ORDER — SODIUM CHLORIDE 0.9 % WEIGHT BASED INFUSION: 1.0000 mL/kg/h | INTRAVENOUS | Status: DC

## 2021-09-01 SURGICAL SUPPLY — 13 items
CATH INFINITI 5FR AL1 (CATHETERS) ×1 IMPLANT
CATH INFINITI 5FR MULTPACK ANG (CATHETERS) ×1 IMPLANT
CLOSURE MYNX CONTROL 5F (Vascular Products) ×1 IMPLANT
GLIDESHEATH SLEND SS 6F .021 (SHEATH) IMPLANT
GUIDEWIRE INQWIRE 1.5J.035X260 (WIRE) IMPLANT
INQWIRE 1.5J .035X260CM (WIRE)
KIT HEART LEFT (KITS) ×2 IMPLANT
PACK CARDIAC CATHETERIZATION (CUSTOM PROCEDURE TRAY) ×2 IMPLANT
SHEATH PINNACLE 5F 10CM (SHEATH) ×1 IMPLANT
SHEATH PROBE COVER 6X72 (BAG) ×1 IMPLANT
TRANSDUCER W/STOPCOCK (MISCELLANEOUS) ×2 IMPLANT
TUBING CIL FLEX 10 FLL-RA (TUBING) ×2 IMPLANT
WIRE EMERALD 3MM-J .035X150CM (WIRE) ×1 IMPLANT

## 2021-09-01 NOTE — Interval H&P Note (Signed)
Cath Lab Visit (complete for each Cath Lab visit)  Clinical Evaluation Leading to the Procedure:   ACS: No.  Non-ACS:    Anginal Classification: CCS II  Anti-ischemic medical therapy: Maximal Therapy (2 or more classes of medications)  Non-Invasive Test Results: No non-invasive testing performed  Prior CABG: No previous CABG      History and Physical Interval Note:  09/01/2021 10:39 AM  [name]  has presented today for surgery, with the diagnosis of chest pain.  The various methods of treatment have been discussed with the patient and family. After consideration of risks, benefits and other options for treatment, the patient has consented to  Procedure(s): LEFT HEART CATH AND CORONARY ANGIOGRAPHY (N/A) as a surgical intervention.  The patient's history has been reviewed, patient examined, no change in status, stable for surgery.  I have reviewed the patient's chart and labs.  Questions were answered to the patient's satisfaction.     Shelva Majestic

## 2021-09-07 ENCOUNTER — Telehealth: Payer: Self-pay | Admitting: Cardiology

## 2021-09-07 NOTE — Telephone Encounter (Signed)
See heart monitor result for details of call

## 2021-09-07 NOTE — Telephone Encounter (Signed)
Follow Up:     Patient said he was returning  a call from today. Patient did not know who called him.

## 2021-10-16 ENCOUNTER — Other Ambulatory Visit: Payer: Self-pay | Admitting: Family Medicine

## 2021-10-20 ENCOUNTER — Ambulatory Visit (INDEPENDENT_AMBULATORY_CARE_PROVIDER_SITE_OTHER): Payer: Medicare Other

## 2021-10-20 DIAGNOSIS — I442 Atrioventricular block, complete: Secondary | ICD-10-CM | POA: Diagnosis not present

## 2021-10-21 ENCOUNTER — Encounter: Payer: Self-pay | Admitting: Internal Medicine

## 2021-10-21 ENCOUNTER — Ambulatory Visit: Payer: Medicare Other | Admitting: Internal Medicine

## 2021-10-21 VITALS — BP 134/70 | HR 60 | Ht 71.0 in | Wt 224.8 lb

## 2021-10-21 DIAGNOSIS — I25119 Atherosclerotic heart disease of native coronary artery with unspecified angina pectoris: Secondary | ICD-10-CM

## 2021-10-21 DIAGNOSIS — I442 Atrioventricular block, complete: Secondary | ICD-10-CM

## 2021-10-21 DIAGNOSIS — I10 Essential (primary) hypertension: Secondary | ICD-10-CM

## 2021-10-21 NOTE — Progress Notes (Signed)
PCP: Kathyrn Drown, MD Primary Cardiologist: Dr Harl Bowie Primary EP:  Dr Mardee Postin Lebron is a 66 y.o. male who presents today for routine electrophysiology followup.  Since last being seen in our clinic, the patient reports doing very well.  Today, he denies symptoms of palpitations, chest pain, shortness of breath,  lower extremity edema, dizziness, presyncope, or syncope.  He was having palpitations several months ago.  He reports removing sugar from his diet with resolution.  The patient is otherwise without complaint today.   Past Medical History:  Diagnosis Date   ADD (attention deficit disorder)    Rx-Adderall   Anginal pain ([place])    Aortic atherosclerosis ([place]) 05/27/2020   Seen on x-ray being treated with statin   AV block, 3rd degree ([place])    Colon polyps 2011   tubular adenoma by excisional biopsy during colonoscopy   Complete heart block ([place])    a. s/p PPM Placement in 12/2012 with Medtronic CRT-P placement in 03/2014   Coronary artery disease    a. s/p PCI of the RCA in 2014 b. 08/2018: patent RCA stent with new mid-LAD 85% stenosis (treated with PCI/DES) and 85% distal OM2 stenosis (med management recommended)   Degenerative disc disease, cervical    DM (diabetes mellitus) ([place])    Gastrointestinal bleeding 10/15/2017   GERD (gastroesophageal reflux disease)    Hypertension    Pacemaker    Palpitations 2003   Holter in 2003: PACs and PVCs; negative stress nuclear test in 2005; right bundle branch block; echo in 2008-mild LVH, otherwise normal; 2008-negative stress nuclear test.   Pneumonia    Prostatitis    prostate calcifications by CT   Sleep apnea    questionable diagnosis   Past Surgical History:  Procedure Laterality Date   ANTERIOR CERVICAL DECOMP/DISCECTOMY FUSION     BACK SURGERY     Biventricular pacemaker upgrade/ system revision  04/02/14   removal of previous atrial and ventricular leads with placement of a new MDT Consulta CRT-P system  at [facility] by Dr Violet Baldy   COLONOSCOPY     COLONOSCOPY W/ POLYPECTOMY  2011   tubular adenoma   CORONARY BALLOON ANGIOPLASTY N/A 06/15/2020   Procedure: CORONARY BALLOON ANGIOPLASTY;  Surgeon: Jettie Booze, MD;  Location: [place] CV LAB;  Service: Cardiovascular;  Laterality: N/A;   CORONARY STENT INTERVENTION N/A 08/22/2018   Procedure: CORONARY STENT INTERVENTION;  Surgeon: Troy Sine, MD;  Location: [place] CV LAB;  Service: Cardiovascular;  Laterality: N/A;   ESOPHAGOGASTRODUODENOSCOPY     Gastritis   INSERT / REPLACE / REMOVE PACEMAKER  12/17/2012   MDT Adapta L implanted by Dr Rayann Heman for mobitz II second degree AV block   INTRAVASCULAR ULTRASOUND/IVUS N/A 06/15/2020   Procedure: Intravascular Ultrasound/IVUS;  Surgeon: Jettie Booze, MD;  Location: [place] CV LAB;  Service: Cardiovascular;  Laterality: N/A;   KNEE SURGERY Right    LEFT HEART CATH AND CORONARY ANGIOGRAPHY N/A 08/22/2018   Procedure: LEFT HEART CATH AND CORONARY ANGIOGRAPHY;  Surgeon: Troy Sine, MD;  Location: [place] CV LAB;  Service: Cardiovascular;  Laterality: N/A;   LEFT HEART CATH AND CORONARY ANGIOGRAPHY N/A 06/15/2020   Procedure: LEFT HEART CATH AND CORONARY ANGIOGRAPHY;  Surgeon: Jettie Booze, MD;  Location: [place] CV LAB;  Service: Cardiovascular;  Laterality: N/A;   LEFT HEART CATH AND CORONARY ANGIOGRAPHY N/A 09/01/2021   Procedure: LEFT HEART CATH AND CORONARY ANGIOGRAPHY;  Surgeon:  Troy Sine, MD;  Location: [place] CV LAB;  Service: Cardiovascular;  Laterality: N/A;   LEFT HEART CATHETERIZATION WITH CORONARY ANGIOGRAM N/A 10/17/2012   Procedure: LEFT HEART CATHETERIZATION WITH CORONARY ANGIOGRAM;  Surgeon: Josue Hector, MD;  Location: [facility] CATH LAB;  Service: Cardiovascular;  Laterality: N/A;   LEFT HEART CATHETERIZATION WITH CORONARY ANGIOGRAM N/A 12/17/2012   Procedure: LEFT HEART CATHETERIZATION WITH CORONARY ANGIOGRAM;  Surgeon: Peter M  Martinique, MD;  Location: [facility] CATH LAB;  Service: Cardiovascular;  Laterality: N/A;   LEFT HEART CATHETERIZATION WITH CORONARY ANGIOGRAM N/A 06/11/2013   Procedure: LEFT HEART CATHETERIZATION WITH CORONARY ANGIOGRAM;  Surgeon: Wellington Hampshire, MD;  Location: [place] CATH LAB;  Service: Cardiovascular;  Laterality: N/A;   PERCUTANEOUS CORONARY STENT INTERVENTION (PCI-S)  10/17/2012   Procedure: PERCUTANEOUS CORONARY STENT INTERVENTION (PCI-S);  Surgeon: Josue Hector, MD;  Location: [facility] CATH LAB;  Service: Cardiovascular;;   PERMANENT PACEMAKER INSERTION N/A 12/17/2012   Procedure: PERMANENT PACEMAKER INSERTION;  Surgeon: Thompson Grayer, MD;  Location: [facility] CATH LAB;  Service: Cardiovascular;  Laterality: N/A;   POLYPECTOMY     UPPER GASTROINTESTINAL ENDOSCOPY      ROS- all systems are reviewed and negative except as per HPI above  Current Outpatient Medications  Medication Sig Dispense Refill   acetaminophen (TYLENOL) 500 MG tablet Take 1,500 mg by mouth daily as needed for moderate pain.     ALPRAZolam (XANAX) 0.5 MG tablet 1/2-1 twice daily as needed home use only use sparingly 20 tablet 0   amLODipine (NORVASC) 10 MG tablet TAKE 1 TABLET(10 MG) BY MOUTH DAILY 90 tablet 2   aspirin EC 81 MG tablet Take 81 mg by mouth daily. Swallow whole.     famotidine (PEPCID) 20 MG tablet One after supper 30 tablet 11   HYDROmorphone (DILAUDID) 4 MG tablet Take 1 tablet (4 mg total) by mouth every 6 (six) hours as needed for severe pain. 20 tablet 0   isosorbide mononitrate (IMDUR) 120 MG 24 hr tablet TAKE 1 TABLET(120 MG) BY MOUTH DAILY 90 tablet 1   losartan (COZAAR) 100 MG tablet TAKE 1 TABLET(100 MG) BY MOUTH DAILY 90 tablet 0   metFORMIN (GLUCOPHAGE) 500 MG tablet Take 1 tablet by mouth Twice a day (Patient taking differently: Take 250-500 mg by mouth in the morning and at bedtime. Take 250 mg twice daily for 7 days then increase to 500 mg twice daily) 60 tablet 5   Multiple Vitamin (MULTIVITAMIN) tablet Take 1  tablet by mouth daily.     nadolol (CORGARD) 80 MG tablet Take 1.5 tablets (120 mg total) by mouth daily. TAKE 1 TABLET(80 MG) BY MOUTH DAILY (Patient taking differently: Take 120 mg by mouth daily.) 135 tablet 1   nitroGLYCERIN (NITROSTAT) 0.4 MG SL tablet Place 1 tablet (0.4 mg total) under the tongue every 5 (five) minutes as needed for chest pain (MAX 3 TABLETS). 25 tablet 3   pantoprazole (PROTONIX) 40 MG tablet TAKE 1 TABLET(40 MG) BY MOUTH DAILY 30 TO 60 MINUTES BEFORE FIRST MEAL OF THE DAY 30 tablet 2   pregabalin (LYRICA) 50 MG capsule TAKE 1 CAPSULE BY MOUTH EVERY MORNING AND 1 CAPSULE EVERY EVENING (Patient not taking: Reported on 07/27/2021) 60 capsule 3   promethazine (PHENERGAN) 25 MG tablet TAKE 1 TABLET BY MOUTH TWICE DAILY AS NEEDED FOR NAUSEA 30 tablet 5   ranolazine (RANEXA) 500 MG 12 hr tablet Take 1 tablet (500 mg total) by mouth 2 (two) times daily. 60 tablet  11   rosuvastatin (CRESTOR) 20 MG tablet TAKE 1 TABLET(20 MG) BY MOUTH DAILY 90 tablet 1   tamsulosin (FLOMAX) 0.4 MG CAPS capsule TAKE 1 CAPSULE(0.4 MG) BY MOUTH DAILY 90 capsule 1   Tetrahydrozoline HCl (VISINE OP) Place 1 drop into both eyes daily as needed (allergies).     Current Facility-Administered Medications  Medication Dose Route Frequency Provider Last Rate Last Admin   sodium chloride flush (NS) 0.9 % injection 3 mL  3 mL Intravenous Q12H Arnoldo Lenis, MD        Physical Exam: Vitals:   10/21/21 0933  BP: 134/70  Pulse: 60  SpO2: 97%  Weight: 224 lb 12.8 oz (102 kg)  Height: '5\' 11"'$  (1.803 m)    GEN- The patient is well appearing, alert and oriented x 3 today.   Head- normocephalic, atraumatic Eyes-  Sclera clear, conjunctiva pink Ears- hearing intact Oropharynx- clear Lungs- Clear to ausculation bilaterally, normal work of breathing Chest- pacemaker pocket is well healed Heart- Regular rate and rhythm, no murmurs, rubs or gallops, PMI not laterally displaced GI- soft, NT, ND, +  BS Extremities- no clubbing, cyanosis, or edema  Pacemaker interrogation- reviewed in detail today,  See PACEART report   Assessment and Plan:  1. Symptomatic complete heart block Normal pacemaker function See Pace Art report No changes today he is device dependant today  2. CAD S/p cath 5/23 which showed nonobstructive dz Previously s/p PTCA of mid RCA  3. HTN Stable No change required today  4. HL On statin  Return in a year  Thompson Grayer MD, [facility] 10/21/2021 9:47 AM

## 2021-10-21 NOTE — Patient Instructions (Signed)
Medication Instructions:  Continue all current medications.  Labwork: none  Testing/Procedures: none  Follow-Up: 1 year - Dr.  Allred   Any Other Special Instructions Will Be Listed Below (If Applicable).   If you need a refill on your cardiac medications before your next appointment, please call your pharmacy.  

## 2021-10-22 LAB — CUP PACEART REMOTE DEVICE CHECK
Battery Remaining Longevity: 25 mo
Battery Voltage: 2.92 V
Brady Statistic AP VP Percent: 54.06 %
Brady Statistic AP VS Percent: 0.23 %
Brady Statistic AS VP Percent: 45.29 %
Brady Statistic AS VS Percent: 0.42 %
Brady Statistic RA Percent Paced: 52.65 %
Brady Statistic RV Percent Paced: 97.26 %
Date Time Interrogation Session: 20230706153809
Implantable Lead Implant Date: 20151217
Implantable Lead Implant Date: 20151217
Implantable Lead Implant Date: 20151217
Implantable Lead Location: 753858
Implantable Lead Location: 753859
Implantable Lead Location: 753860
Implantable Lead Model: 4196
Implantable Lead Model: 5076
Implantable Lead Model: 5076
Implantable Pulse Generator Implant Date: 20151217
Lead Channel Impedance Value: 380 Ohm
Lead Channel Impedance Value: 380 Ohm
Lead Channel Impedance Value: 399 Ohm
Lead Channel Impedance Value: 437 Ohm
Lead Channel Impedance Value: 589 Ohm
Lead Channel Impedance Value: 608 Ohm
Lead Channel Impedance Value: 665 Ohm
Lead Channel Impedance Value: 665 Ohm
Lead Channel Impedance Value: 988 Ohm
Lead Channel Pacing Threshold Amplitude: 0.75 V
Lead Channel Pacing Threshold Amplitude: 0.75 V
Lead Channel Pacing Threshold Amplitude: 1 V
Lead Channel Pacing Threshold Pulse Width: 0.4 ms
Lead Channel Pacing Threshold Pulse Width: 0.4 ms
Lead Channel Pacing Threshold Pulse Width: 0.4 ms
Lead Channel Sensing Intrinsic Amplitude: 0.5 mV
Lead Channel Sensing Intrinsic Amplitude: 0.5 mV
Lead Channel Sensing Intrinsic Amplitude: 18 mV
Lead Channel Sensing Intrinsic Amplitude: 18 mV
Lead Channel Setting Pacing Amplitude: 2 V
Lead Channel Setting Pacing Amplitude: 2.25 V
Lead Channel Setting Pacing Amplitude: 2.5 V
Lead Channel Setting Pacing Pulse Width: 0.4 ms
Lead Channel Setting Pacing Pulse Width: 0.4 ms
Lead Channel Setting Sensing Sensitivity: 0.9 mV

## 2021-11-03 LAB — CUP PACEART INCLINIC DEVICE CHECK
Battery Remaining Longevity: 25 mo
Battery Voltage: 2.92 V
Brady Statistic AP VP Percent: 37.32 %
Brady Statistic AP VS Percent: 0.07 %
Brady Statistic AS VP Percent: 62.45 %
Brady Statistic AS VS Percent: 0.16 %
Brady Statistic RA Percent Paced: 37.03 %
Brady Statistic RV Percent Paced: 99.2 %
Date Time Interrogation Session: 20230707085300
Implantable Lead Implant Date: 20151217
Implantable Lead Implant Date: 20151217
Implantable Lead Implant Date: 20151217
Implantable Lead Location: 753858
Implantable Lead Location: 753859
Implantable Lead Location: 753860
Implantable Lead Model: 4196
Implantable Lead Model: 5076
Implantable Lead Model: 5076
Implantable Pulse Generator Implant Date: 20151217
Lead Channel Impedance Value: 342 Ohm
Lead Channel Impedance Value: 380 Ohm
Lead Channel Impedance Value: 399 Ohm
Lead Channel Impedance Value: 456 Ohm
Lead Channel Impedance Value: 570 Ohm
Lead Channel Impedance Value: 608 Ohm
Lead Channel Impedance Value: 646 Ohm
Lead Channel Impedance Value: 684 Ohm
Lead Channel Impedance Value: 988 Ohm
Lead Channel Pacing Threshold Amplitude: 0.75 V
Lead Channel Pacing Threshold Amplitude: 0.75 V
Lead Channel Pacing Threshold Amplitude: 1 V
Lead Channel Pacing Threshold Pulse Width: 0.4 ms
Lead Channel Pacing Threshold Pulse Width: 0.4 ms
Lead Channel Pacing Threshold Pulse Width: 0.4 ms
Lead Channel Sensing Intrinsic Amplitude: 0.875 mV
Lead Channel Sensing Intrinsic Amplitude: 1.75 mV
Lead Channel Sensing Intrinsic Amplitude: 18 mV
Lead Channel Sensing Intrinsic Amplitude: 18 mV
Lead Channel Setting Pacing Amplitude: 2 V
Lead Channel Setting Pacing Amplitude: 2.25 V
Lead Channel Setting Pacing Amplitude: 2.5 V
Lead Channel Setting Pacing Pulse Width: 0.4 ms
Lead Channel Setting Pacing Pulse Width: 0.4 ms
Lead Channel Setting Sensing Sensitivity: 0.9 mV

## 2021-11-08 NOTE — Progress Notes (Signed)
Remote pacemaker transmission.   

## 2021-11-14 ENCOUNTER — Encounter: Payer: Self-pay | Admitting: Cardiology

## 2021-11-14 ENCOUNTER — Ambulatory Visit: Payer: Medicare Other | Admitting: Cardiology

## 2021-11-14 VITALS — BP 124/68 | HR 60 | Ht 71.0 in | Wt 222.6 lb

## 2021-11-14 DIAGNOSIS — E782 Mixed hyperlipidemia: Secondary | ICD-10-CM

## 2021-11-14 DIAGNOSIS — R002 Palpitations: Secondary | ICD-10-CM | POA: Diagnosis not present

## 2021-11-14 DIAGNOSIS — I2510 Atherosclerotic heart disease of native coronary artery without angina pectoris: Secondary | ICD-10-CM

## 2021-11-14 MED ORDER — ROSUVASTATIN CALCIUM 10 MG PO TABS: 10.0000 mg | ORAL_TABLET | Freq: Every day | ORAL | 6 refills | Status: DC

## 2021-11-14 NOTE — Progress Notes (Signed)
Clinical Summary Mr. Plitt is a 66 y.o.male seen today for a focused visit for symptoms of chest pain and palpitations.    1. CAD - history of DES to LAD in 08/2018, PCI to RCA in 2014   06/2019 echo: LVEF 60-65%, no WMAs, grade I DDx,  - 09/2019 nuclear stress: small apical infarct with mild peri-infarct ischemia   06/2020 cath: prox LAD 50%, patent LAD stent, LCX 85% distal, RCA 75% with stent ISR. Had PTCA of RCA ISR. From interventional could hold plavix after 30 days if needed       - last visit some fatigue, dizziness, SOB. We lowered his hydralazine to '25mg'$  bid and lowered imdur to '120mg'$  daily.         08/2021 cath: prox LAD 50%, LCX 30%, RCA patent stents. LVEDP 11  - no recent chest pains. Reports symptoms with dietary changes - compliant with meds      2.Palpitations - feeling of skipping heart beat, associated with pressure in chest and into neck. - increase in frequency recently.  - rare caffeine, rare EtOH   07/2021 minotor; frequent PACs (9.1%), rare PVCs. 6 beat run of NSVT.    - no recent palpitations         3. HTN - compliant with meds   3. Complete heart block - pacemaker followed by Dr Rayann Heman, CRT-P - normal device check Jan 2023    09/2021 device check normal   4. Hyperlipidemia    06/2020 TC 119 HDL 52 TG 113 LDL 47  02/2021 TC 132 TG 73 HDL 58 LDL 59  - reports significant diffuse muscle aches in legs often at night.    5. Orthostastic hypotension - can have lightheadedness with standing - she reports SBP 120-->110 - no recent symptoms.    SH; just sold his house, was able to get a great price and only on market for a week. Living in a fifth wheel for the time being.  Recently bought new home.  Past Medical History:  Diagnosis Date   ADD (attention deficit disorder)    Rx-Adderall   Anginal pain ([place])    Aortic atherosclerosis ([place]) 05/27/2020   Seen on x-ray being treated with statin   AV block, 3rd degree ([place])    Colon  polyps 2011   tubular adenoma by excisional biopsy during colonoscopy   Complete heart block ([place])    a. s/p PPM Placement in 12/2012 with Medtronic CRT-P placement in 03/2014   Coronary artery disease    a. s/p PCI of the RCA in 2014 b. 08/2018: patent RCA stent with new mid-LAD 85% stenosis (treated with PCI/DES) and 85% distal OM2 stenosis (med management recommended)   Degenerative disc disease, cervical    DM (diabetes mellitus) ([place])    Gastrointestinal bleeding 10/15/2017   GERD (gastroesophageal reflux disease)    Hypertension    Pacemaker    Palpitations 2003   Holter in 2003: PACs and PVCs; negative stress nuclear test in 2005; right bundle Olis Viverette block; echo in 2008-mild LVH, otherwise normal; 2008-negative stress nuclear test.   Pneumonia    Prostatitis    prostate calcifications by CT   Sleep apnea    questionable diagnosis     Allergies  Allergen Reactions   Morphine And Related Itching and Rash    redness   Lasix [Furosemide]     Chest pain and tightness   Latex Rash     Current Outpatient Medications  Medication Sig  Dispense Refill   acetaminophen (TYLENOL) 500 MG tablet Take 1,500 mg by mouth daily as needed for moderate pain.     ALPRAZolam (XANAX) 0.5 MG tablet 1/2-1 twice daily as needed home use only use sparingly 20 tablet 0   amLODipine (NORVASC) 10 MG tablet TAKE 1 TABLET(10 MG) BY MOUTH DAILY 90 tablet 2   aspirin EC 81 MG tablet Take 81 mg by mouth daily. Swallow whole.     famotidine (PEPCID) 20 MG tablet One after supper (Patient not taking: Reported on 10/21/2021) 30 tablet 11   HYDROmorphone (DILAUDID) 4 MG tablet Take 1 tablet (4 mg total) by mouth every 6 (six) hours as needed for severe pain. 20 tablet 0   isosorbide mononitrate (IMDUR) 120 MG 24 hr tablet TAKE 1 TABLET(120 MG) BY MOUTH DAILY 90 tablet 1   losartan (COZAAR) 100 MG tablet TAKE 1 TABLET(100 MG) BY MOUTH DAILY 90 tablet 0   metFORMIN (GLUCOPHAGE) 500 MG tablet Take 1 tablet by  mouth Twice a day (Patient taking differently: Take 250-500 mg by mouth in the morning and at bedtime. Take 250 mg twice daily for 7 days then increase to 500 mg twice daily) 60 tablet 5   Multiple Vitamin (MULTIVITAMIN) tablet Take 1 tablet by mouth daily.     nadolol (CORGARD) 80 MG tablet Take 1.5 tablets (120 mg total) by mouth daily. TAKE 1 TABLET(80 MG) BY MOUTH DAILY (Patient taking differently: Take 120 mg by mouth daily.) 135 tablet 1   nitroGLYCERIN (NITROSTAT) 0.4 MG SL tablet Place 1 tablet (0.4 mg total) under the tongue every 5 (five) minutes as needed for chest pain (MAX 3 TABLETS). 25 tablet 3   pantoprazole (PROTONIX) 40 MG tablet TAKE 1 TABLET(40 MG) BY MOUTH DAILY 30 TO 60 MINUTES BEFORE FIRST MEAL OF THE DAY (Patient not taking: Reported on 10/21/2021) 30 tablet 2   pregabalin (LYRICA) 50 MG capsule TAKE 1 CAPSULE BY MOUTH EVERY MORNING AND 1 CAPSULE EVERY EVENING (Patient not taking: Reported on 07/27/2021) 60 capsule 3   promethazine (PHENERGAN) 25 MG tablet TAKE 1 TABLET BY MOUTH TWICE DAILY AS NEEDED FOR NAUSEA 30 tablet 5   ranolazine (RANEXA) 500 MG 12 hr tablet Take 1 tablet (500 mg total) by mouth 2 (two) times daily. 60 tablet 11   rosuvastatin (CRESTOR) 20 MG tablet TAKE 1 TABLET(20 MG) BY MOUTH DAILY 90 tablet 1   tamsulosin (FLOMAX) 0.4 MG CAPS capsule TAKE 1 CAPSULE(0.4 MG) BY MOUTH DAILY 90 capsule 1   Tetrahydrozoline HCl (VISINE OP) Place 1 drop into both eyes daily as needed (allergies).     Current Facility-Administered Medications  Medication Dose Route Frequency Provider Last Rate Last Admin   sodium chloride flush (NS) 0.9 % injection 3 mL  3 mL Intravenous Q12H Shalon Salado, Alphonse Guild, MD         Past Surgical History:  Procedure Laterality Date   ANTERIOR CERVICAL DECOMP/DISCECTOMY FUSION     BACK SURGERY     Biventricular pacemaker upgrade/ system revision  04/02/14   removal of previous atrial and ventricular leads with placement of a new MDT Consulta CRT-P  system at [facility] by Dr Violet Baldy   COLONOSCOPY     COLONOSCOPY W/ POLYPECTOMY  2011   tubular adenoma   CORONARY BALLOON ANGIOPLASTY N/A 06/15/2020   Procedure: [place];  Surgeon: Jettie Booze, MD;  Location: [place] CV LAB;  Service: Cardiovascular;  Laterality: N/A;   CORONARY STENT INTERVENTION N/A 08/22/2018  Procedure: CORONARY STENT INTERVENTION;  Surgeon: Troy Sine, MD;  Location: [place] CV LAB;  Service: Cardiovascular;  Laterality: N/A;   ESOPHAGOGASTRODUODENOSCOPY     Gastritis   INSERT / REPLACE / REMOVE PACEMAKER  12/17/2012   MDT Adapta L implanted by Dr Rayann Heman for mobitz II second degree AV block   INTRAVASCULAR ULTRASOUND/IVUS N/A 06/15/2020   Procedure: Intravascular Ultrasound/IVUS;  Surgeon: Jettie Booze, MD;  Location: [place] CV LAB;  Service: Cardiovascular;  Laterality: N/A;   KNEE SURGERY Right    LEFT HEART CATH AND CORONARY ANGIOGRAPHY N/A 08/22/2018   Procedure: LEFT HEART CATH AND CORONARY ANGIOGRAPHY;  Surgeon: Troy Sine, MD;  Location: [place] CV LAB;  Service: Cardiovascular;  Laterality: N/A;   LEFT HEART CATH AND CORONARY ANGIOGRAPHY N/A 06/15/2020   Procedure: LEFT HEART CATH AND CORONARY ANGIOGRAPHY;  Surgeon: Jettie Booze, MD;  Location: [place] CV LAB;  Service: Cardiovascular;  Laterality: N/A;   LEFT HEART CATH AND CORONARY ANGIOGRAPHY N/A 09/01/2021   Procedure: LEFT HEART CATH AND CORONARY ANGIOGRAPHY;  Surgeon: Troy Sine, MD;  Location: [place] CV LAB;  Service: Cardiovascular;  Laterality: N/A;   LEFT HEART CATHETERIZATION WITH CORONARY ANGIOGRAM N/A 10/17/2012   Procedure: LEFT HEART CATHETERIZATION WITH CORONARY ANGIOGRAM;  Surgeon: Josue Hector, MD;  Location: [facility] CATH LAB;  Service: Cardiovascular;  Laterality: N/A;   LEFT HEART CATHETERIZATION WITH CORONARY ANGIOGRAM N/A 12/17/2012   Procedure: LEFT HEART CATHETERIZATION WITH CORONARY ANGIOGRAM;  Surgeon:  Peter M Martinique, MD;  Location: [facility] CATH LAB;  Service: Cardiovascular;  Laterality: N/A;   LEFT HEART CATHETERIZATION WITH CORONARY ANGIOGRAM N/A 06/11/2013   Procedure: LEFT HEART CATHETERIZATION WITH CORONARY ANGIOGRAM;  Surgeon: Wellington Hampshire, MD;  Location: [place] CATH LAB;  Service: Cardiovascular;  Laterality: N/A;   PERCUTANEOUS CORONARY STENT INTERVENTION (PCI-S)  10/17/2012   Procedure: PERCUTANEOUS CORONARY STENT INTERVENTION (PCI-S);  Surgeon: Josue Hector, MD;  Location: [facility] CATH LAB;  Service: Cardiovascular;;   PERMANENT PACEMAKER INSERTION N/A 12/17/2012   Procedure: PERMANENT PACEMAKER INSERTION;  Surgeon: Thompson Grayer, MD;  Location: [facility] CATH LAB;  Service: Cardiovascular;  Laterality: N/A;   POLYPECTOMY     UPPER GASTROINTESTINAL ENDOSCOPY       Allergies  Allergen Reactions   Morphine And Related Itching and Rash    redness   Lasix [Furosemide]     Chest pain and tightness   Latex Rash      Family History  Problem Relation Age of Onset   Heart disease Mother    Hypertension Mother        Carotid disease   Prostate cancer Brother    Breast cancer Maternal Aunt    Colon cancer Neg Hx    Colon polyps Neg Hx      Social History Mr. Freel reports that he quit smoking about 17 years ago. His smoking use included cigarettes. He started smoking about 54 years ago. He has a 100.00 pack-year smoking history. He has quit using smokeless tobacco. Mr. Ingwersen reports current alcohol use of about 1.0 standard drink of alcohol per week.   Review of Systems CONSTITUTIONAL: No weight loss, fever, chills, weakness or fatigue.  HEENT: Eyes: No visual loss, blurred vision, double vision or yellow sclerae.No hearing loss, sneezing, congestion, runny nose or sore throat.  SKIN: No rash or itching.  CARDIOVASCULAR: per hpi RESPIRATORY: No shortness of breath, cough or sputum.  GASTROINTESTINAL: No anorexia, nausea, vomiting or diarrhea. No abdominal pain  or blood.  GENITOURINARY:  No burning on urination, no polyuria NEUROLOGICAL: No headache, dizziness, syncope, paralysis, ataxia, numbness or tingling in the extremities. No change in bowel or bladder control.  MUSCULOSKELETAL: No muscle, back pain, joint pain or stiffness.  LYMPHATICS: No enlarged nodes. No history of splenectomy.  PSYCHIATRIC: No history of depression or anxiety.  ENDOCRINOLOGIC: No reports of sweating, cold or heat intolerance. No polyuria or polydipsia.  Marland Kitchen   Physical Examination Today's Vitals   11/14/21 1124  BP: 124/68  Pulse: 60  SpO2: 95%  Weight: 222 lb 9.6 oz (101 kg)  Height: '5\' 11"'$  (1.803 m)   Body mass index is 31.05 kg/m.  Gen: resting comfortably, no acute distress HEENT: no scleral icterus, pupils equal round and reactive, no palptable cervical adenopathy,  CV: RRR, no m/r/g no jvd Resp: Clear to auscultation bilaterally GI: abdomen is soft, non-tender, non-distended, normal bowel sounds, no hepatosplenomegaly MSK: extremities are warm, no edema.  Skin: warm, no rash Neuro:  no focal deficits Psych: appropriate affect   Diagnostic Studies  08/2021 cath  Prox LAD lesion is 50% stenosed.   Prox Cx lesion is 10% stenosed.   Ost Cx lesion is 10% stenosed.   Dist Cx lesion is 30% stenosed.   Mid RCA lesion is 5% stenosed.   Non-stenotic Mid LAD lesion was previously treated.   The left ventricular systolic function is normal.   LV end diastolic pressure is normal.   The left ventricular ejection fraction is greater than 65% by visual estimate.   Mild -moderate CAD with previously noted smooth 50% LAD stenosis after the first diagonal and septal perforating artery with a widely patent mid LAD stent; mild nonobstructive left circumflex stenosis in a co-dominant vessel; and superior takeoff RCA with smooth very mild 5% intimal hyperplasia within the mid RCA stent.   Hyperdynamic LV function with EF estimated at least 65%.  LVEDP 11 mmHg.   RECOMMENDATION: Medical  therapy.   Assessment and Plan   1.CAD with unspecified angina -recent cath as reported above - no recent symptoms, continue current meds   2. Palpitations - recent monitor with primarily PACs - symptoms doing well on nadolol, continue  3. Hyperlipidemia - diffuse leg pains at night unclear if statin related, will try lowering crestor to '10mg'$  daily     Arnoldo Lenis, M.D.

## 2021-11-14 NOTE — Patient Instructions (Signed)
Medication Instructions:  Decrease Crestor to '10mg'$  daily  Continue all other medications.     Labwork: none  Testing/Procedures: none  Follow-Up: 6 months   Any Other Special Instructions Will Be Listed Below (If Applicable). Please call / mychart message the office in 6 weeks to give [place] update on your leg pain.    If you need a refill on your cardiac medications before your next appointment, please call your pharmacy.

## 2021-11-18 DIAGNOSIS — Z79899 Other long term (current) drug therapy: Secondary | ICD-10-CM | POA: Diagnosis not present

## 2021-11-18 DIAGNOSIS — E118 Type 2 diabetes mellitus with unspecified complications: Secondary | ICD-10-CM | POA: Diagnosis not present

## 2021-11-18 DIAGNOSIS — I10 Essential (primary) hypertension: Secondary | ICD-10-CM | POA: Diagnosis not present

## 2021-11-19 LAB — BASIC METABOLIC PANEL
BUN/Creatinine Ratio: 20 (ref 10–24)
BUN: 18 mg/dL (ref 8–27)
CO2: 24 mmol/L (ref 20–29)
Calcium: 9.8 mg/dL (ref 8.6–10.2)
Chloride: 104 mmol/L (ref 96–106)
Creatinine, Ser: 0.88 mg/dL (ref 0.76–1.27)
Glucose: 132 mg/dL — ABNORMAL HIGH (ref 70–99)
Potassium: 4.6 mmol/L (ref 3.5–5.2)
Sodium: 143 mmol/L (ref 134–144)
eGFR: 94 mL/min/{1.73_m2} (ref >59)

## 2021-11-19 LAB — HEMOGLOBIN A1C
Est. average glucose Bld gHb Est-mCnc: 131 mg/dL
Hgb A1c MFr Bld: 6.2 % — ABNORMAL HIGH (ref 4.8–5.6)

## 2021-11-22 ENCOUNTER — Ambulatory Visit (INDEPENDENT_AMBULATORY_CARE_PROVIDER_SITE_OTHER): Payer: Medicare Other | Admitting: Family Medicine

## 2021-11-22 ENCOUNTER — Encounter: Payer: Self-pay | Admitting: Family Medicine

## 2021-11-22 VITALS — BP 132/74 | HR 58 | Temp 98.2°F | Ht 71.0 in | Wt 225.0 lb

## 2021-11-22 DIAGNOSIS — I10 Essential (primary) hypertension: Secondary | ICD-10-CM | POA: Diagnosis not present

## 2021-11-22 DIAGNOSIS — M79604 Pain in right leg: Secondary | ICD-10-CM | POA: Diagnosis not present

## 2021-11-22 DIAGNOSIS — E1169 Type 2 diabetes mellitus with other specified complication: Secondary | ICD-10-CM

## 2021-11-22 DIAGNOSIS — M25512 Pain in left shoulder: Secondary | ICD-10-CM

## 2021-11-22 DIAGNOSIS — E118 Type 2 diabetes mellitus with unspecified complications: Secondary | ICD-10-CM

## 2021-11-22 DIAGNOSIS — M79605 Pain in left leg: Secondary | ICD-10-CM

## 2021-11-22 DIAGNOSIS — M25511 Pain in right shoulder: Secondary | ICD-10-CM | POA: Diagnosis not present

## 2021-11-22 DIAGNOSIS — E785 Hyperlipidemia, unspecified: Secondary | ICD-10-CM

## 2021-11-22 MED ORDER — OZEMPIC (0.25 OR 0.5 MG/DOSE) 2 MG/3ML ~~LOC~~ SOPN: PEN_INJECTOR | SUBCUTANEOUS | 2 refills | Status: DC

## 2021-11-22 MED ORDER — AMLODIPINE BESYLATE 5 MG PO TABS: 5.0000 mg | ORAL_TABLET | Freq: Every day | ORAL | 1 refills | Status: DC

## 2021-11-22 NOTE — Patient Instructions (Signed)
Discussion of GLP-1 medications Please remember these medications are to assist with weight reduction and diabetes control.  They do not take the place of healthy eating and regular physical activity. There are several GLP-1 medications that can help with weight reduction and diabetes control.  GLP-1 medications with indication for diabetes-Trulicity, Victoza, Bydureon , Mounjaro, Ozempic, and Rybelsus (although these are injected medicines except for Rybelsus which is a pill) GLP-1 medications with indications for weight loss-Wegovy, and Saxenda  Mechanism of action  These medications stimulate glucagon peptide receptors.  By doing so it does the following:  1-slows down stomach emptying-essentially food takes longer to go through the stomach and the intestines-this can lessen appetite  2-reduces glucagon secretion from the pancreas-this helps keep blood glucose levels stable between meals  3-increase his insulin release from the pancreas-with diabetics this helps keep blood glucose levels stable  4-promotes the feeling of being full in the brain-encouraged him receptors in the brain received the signal so the brain and body know that it is time to stop eating Benefits of the medication  1-reduced weight-reduction of weight is more significant at higher dosing.  Not as much weight reduction with Rybelsus   A- typically Wegovy at higher doses can assist with significant weight reduction.  2-improved blood glucose levels-with diabetics typically we see a significant drop with A1c when the medication is adjusted appropriately.  3-decrease risk of heart attacks and strokes-individuals with type 2 diabetes are at increased risk of heart attacks and strokes.  These GLP-1 medications can reduce the risk by 22%.  Risk of GLP-1 medications-these are some of the risks.  It is important to talk with your provider in a shared discussion before starting any medication.  Contraindications to GLP-1  medications-in other words who should not take these.  1-individuals with history of thyroid medullary cancer  2-individuals with family history of multiple endocrine neoplasm syndrome type II (MEN 2)  3-individuals with a history of pancreatitis  4-those with a history of severe hypersensitivity or allergy reactions to GLP-1 medications  5-should be avoided in individuals with a history of suicidal attempts or active suicidal ideation.  Cautions include- 1- risk of thyroid C-cell tumors were seen in rats during clinical testing in humans the relevancy of this information has not been determined 2-risk of pancreatitis including fatal and nonfatal hemorrhagic or necrotizing pancreatitis 3-gallbladder disease slightly increased risk of gallstones 4-hypoglycemia-more common with individuals who are on insulin or sulfonylurea such as glipizide 5-acute kidney injury or worsening of chronic renal failure often this is triggered by severe vomiting and diarrhea as side effects to GLP-1. 6-slightly increased risk of diabetic retinopathy complications in patients with type 2 diabetes 7-heart rate increase-slight increase of base heart rate with GLP-1 8-suicidal behavior and ideation has been reported in clinical trials with other weight loss medicines.  If depression occurs with medication or suicidal ideation stop medication immediately notify provider or seek help.  Common side effects include nausea, vomiting, diarrhea, constipation and increased heart rate.  Less common side effects severe abdominal pain-if this occurs notify provider stop medication get tested for pancreatitis. Full discussion with your provider should occur before starting these medicines.       

## 2021-11-22 NOTE — Progress Notes (Signed)
Subjective:    Patient ID: [name], male    DOB: [birth year], 66 y.o.   MRN: [id number]  Diabetes He presents for his follow-up diabetic visit. He has type 2 diabetes mellitus. Current diabetic treatments: metformin.  Primary hypertension  Bilateral shoulder pain, unspecified chronicity  Pain in both lower extremities  DM type 2, controlled, with complication ([place]) - Plan: Semaglutide,0.25 or 0.'5MG'$ /DOS, (OZEMPIC, 0.25 OR 0.5 MG/DOSE,) 2 MG/3ML SOPN  Hyperlipidemia associated with type 2 diabetes mellitus ([place]) Patient frustrated by his health issues.  A1c doing better fasting sugars typically in the 130-140 he feels currently that he is on his much metformin as he can tolerate it causes significant nausea so therefore we will not bump up the dose of metformin currently  He relates constant pain in both lower legs and into the legs when he sitting standing walking is just a global pain and discomfort.  Is been going on for months.  He typically takes Tylenol for tries to bear with it occasionally uses Dilaudid He does have a history of abnormal MRI of the back which showed a neuroma versus a spinal tumor followed by neurosurgery but they do not feel that is causing his leg pain.  It could well be coming from his back.  He denies restless legs.  He also has bilateral shoulder pain discomfort hurts to raise his arms above chest level.  He relates if he reaches his arms backwards left or right he has severe pain that shoots down the arm.  He also relates at times some neck discomfort.  He did see orthopedic specialist but he did not feel that he was well heard      Review of Systems     Objective:   Physical Exam  General-in no acute distress Eyes-no discharge Lungs-respiratory rate normal, CTA Extremities skin warm dry no edema Neuro grossly normal Behavior normal, alert Subjective bilateral shoulder discomfort with decreased range of motion Extremities no edema foot  exam normal Subjective discomfort into both legs      Assessment & Plan:   Patient was strongly encouraged to get colonoscopy completed.  He states he will call to get this set up  1. Primary hypertension Blood pressure good control he does have ankle edema at the end of the day we did discuss how being on lower dose amlodipine would lessen that he would like to try that he will send [place] updates in the next few weeks how his blood pressure is doing  2. Bilateral shoulder pain, unspecified chronicity [facility]erral to [facility] for second opinion on this on whether or not the pain is from his shoulders or in fact possibly from his neck  3. Pain in both lower extremities This is perplexing.  He has had previous workup of the spine.  Does not appear to be a orthopedic problem in the legs.  Referral to pain management in [facility] for opinion regarding the origin of his musculoskeletal pain.  Patient is not interested in opioid and is not seeking that.  He is just hoping to get some answers of why his legs hurt and what he can do about it  4. DM type 2, controlled, with complication ([place]) Fairly well-controlled with the metformin but his weight is not coming down plus also he is at high risk for heart disease based upon this initiation of Ozempic would be reasonable side effects were discussed he is going to try to get this through the [place] or possibly  through his insurance he will let us know how that is going if we are managing that he will follow-up every 3 months and he will give [place] update every 3 to 4 weeks - Semaglutide,0.25 or 0.'5MG'$ /DOS, (OZEMPIC, 0.25 OR 0.5 MG/DOSE,) 2 MG/3ML SOPN; Inject 0.25 mg once a week for 4 weeks and 0.5 mg for the 2nd month  Dispense: 3 mL; Refill: 2  5. Hyperlipidemia associated with type 2 diabetes mellitus ([place]) Continue current medication. Currently on rosuvastatin unlikely that this is causing his leg pain  Follow-up again in 3 to 4 months

## 2021-11-23 NOTE — Addendum Note (Signed)
Addended by: Vicente Males on: 11/23/2021 09:15 AM   Modules accepted: Orders

## 2021-11-23 NOTE — Progress Notes (Signed)
11/23/21-referrals placed in Epic

## 2021-11-25 ENCOUNTER — Ambulatory Visit (AMBULATORY_SURGERY_CENTER): Payer: Self-pay

## 2021-11-25 VITALS — Ht 71.0 in | Wt 220.0 lb

## 2021-11-25 DIAGNOSIS — Z86010 Personal history of colonic polyps: Secondary | ICD-10-CM

## 2021-11-25 MED ORDER — PEG 3350-KCL-NA BICARB-NACL 420 G PO SOLR: 4000.0000 mL | Freq: Once | ORAL | 0 refills | Status: AC

## 2021-11-25 NOTE — Progress Notes (Signed)
No egg or soy allergy known to patient  No issues known to pt with past sedation with any surgeries or procedures Patient denies ever being told they had issues or difficulty with intubation  No FH of Malignant Hyperthermia Pt is not on diet pills Pt is not on home 02  Pt is not on blood thinners  Pt denies issues with constipation  No A fib or A flutter Have any cardiac testing pending--NO Pt instructed to use [email or web address] or GoodRx for a price reduction on prep   

## 2021-11-30 ENCOUNTER — Ambulatory Visit: Payer: Medicare Other | Admitting: Family Medicine

## 2021-12-05 DIAGNOSIS — M25512 Pain in left shoulder: Secondary | ICD-10-CM | POA: Diagnosis not present

## 2021-12-05 DIAGNOSIS — M7552 Bursitis of left shoulder: Secondary | ICD-10-CM | POA: Diagnosis not present

## 2021-12-05 DIAGNOSIS — M7551 Bursitis of right shoulder: Secondary | ICD-10-CM | POA: Diagnosis not present

## 2021-12-05 DIAGNOSIS — M25511 Pain in right shoulder: Secondary | ICD-10-CM | POA: Diagnosis not present

## 2021-12-06 ENCOUNTER — Encounter: Payer: Self-pay | Admitting: Gastroenterology

## 2021-12-07 DIAGNOSIS — M545 Low back pain, unspecified: Secondary | ICD-10-CM | POA: Diagnosis not present

## 2021-12-07 DIAGNOSIS — M47816 Spondylosis without myelopathy or radiculopathy, lumbar region: Secondary | ICD-10-CM | POA: Diagnosis not present

## 2021-12-08 ENCOUNTER — Other Ambulatory Visit: Payer: Self-pay | Admitting: Sports Medicine

## 2021-12-08 ENCOUNTER — Ambulatory Visit (INDEPENDENT_AMBULATORY_CARE_PROVIDER_SITE_OTHER): Payer: Medicare Other | Admitting: Family Medicine

## 2021-12-08 ENCOUNTER — Encounter: Payer: Self-pay | Admitting: Family Medicine

## 2021-12-08 DIAGNOSIS — G8929 Other chronic pain: Secondary | ICD-10-CM

## 2021-12-08 DIAGNOSIS — J209 Acute bronchitis, unspecified: Secondary | ICD-10-CM

## 2021-12-08 MED ORDER — DOXYCYCLINE HYCLATE 100 MG PO TABS: 100.0000 mg | ORAL_TABLET | Freq: Two times a day (BID) | ORAL | 0 refills | Status: DC

## 2021-12-09 DIAGNOSIS — J209 Acute bronchitis, unspecified: Secondary | ICD-10-CM | POA: Insufficient documentation

## 2021-12-09 NOTE — Assessment & Plan Note (Signed)
Treating with doxycycline. 

## 2021-12-09 NOTE — Progress Notes (Signed)
Subjective:  Patient ID: [name], male    DOB: 1955/12/18  Age: 66 y.o. MRN: [id number]  CC: Chief Complaint  Patient presents with   Cough    Pt arrives with cough and congestion since Aug 14. COVID test negative yesterday. Beginning to cough up greenish-brown mucus. Pt is due for colonoscopy on 12/15/21    HPI:  66 year old male presents for evaluation above.  Patient states that he has had an ongoing cough and associated chest congestion since August 14.  He states that his cough is worsening and is productive of greenish and brown mucus.  He has tested negative for COVID-19 at home.  No reported sick contacts.  No relieving factors.  He is concerned as he has an upcoming colonoscopy.  No increasing shortness of breath.  No chest pain.  No other complaints.  Patient Active Problem List   Diagnosis Date Noted   Acute bronchitis 12/09/2021   Upper airway cough syndrome 12/14/2020   Aortic atherosclerosis ([place]) 05/27/2020   Stable angina pectoris ([place])    Hyperlipidemia 01/04/2016   Anxiety as acute reaction to exceptional stress 05/28/2015   Acquired complete AV block ([place]) 04/02/2014   Disorder of cardiac pacemaker system 04/02/2014   OSA (obstructive sleep apnea) 10/16/2013   BPH (benign prostatic hyperplasia) 08/25/2013   Chest pain of uncertain etiology 44/04/270   Mobitz type II atrioventricular block 12/17/2012   Coronary artery disease involving native coronary artery of native heart with angina pectoris ([place]) 10/18/2012   S/P drug eluting coronary stent placement 10/18/2012   DOE (dyspnea on exertion) 12/26/2011   Hypertension    ADD (attention deficit disorder)    GERD (gastroesophageal reflux disease) 12/20/2011   Hx of adenomatous colonic polyps 12/20/2011    Social Hx   Social History   Socioeconomic History   Marital status: Married    Spouse name: Not on file   Number of children: 3   Years of education: Not on file   Highest education level:  Not on file  Occupational History   Not on file  Tobacco Use   Smoking status: Former    Packs/day: 2.50    Years: 40.00    Total pack years: 100.00    Types: Cigarettes    Start date: 04/18/1967    Quit date: 12/20/2003    Years since quitting: 17.9   Smokeless tobacco: Former  Scientific laboratory technician Use: Former  Substance and Sexual Activity   Alcohol use: Yes    Alcohol/week: 7.0 - 14.0 standard drinks of alcohol    Types: 7 - 14 Shots of liquor per week   Drug use: Not Currently    Types: Marijuana    Comment: 30 + years ago - "crank, marjiuanna, ect."   Sexual activity: Yes    Birth control/protection: Surgical  Other Topics Concern   Not on file  Social History Narrative   Retired Therapist, art.   Social Determinants of Health   Financial Resource Strain: Low Risk  (03/22/2021)   Overall Financial Resource Strain (CARDIA)    Difficulty of Paying Living Expenses: Not hard at all  Food Insecurity: No Food Insecurity (03/22/2021)   Hunger Vital Sign    Worried About Running Out of Food in the Last Year: Never true    Ran Out of Food in the Last Year: Never true  Transportation Needs: No Transportation Needs (03/22/2021)   PRAPARE - Hydrologist (Medical): No  Lack of Transportation (Non-Medical): No  Physical Activity: Insufficiently Active (03/22/2021)   Exercise Vital Sign    Days of Exercise per Week: 2 days    Minutes of Exercise per Session: 30 min  Stress: Stress Concern Present (03/22/2021)   [place]    Feeling of Stress : Rather much  Social Connections: Socially Isolated (03/22/2021)   Social Connection and Isolation Panel [NHANES]    Frequency of Communication with Friends and Family: Never    Frequency of Social Gatherings with Friends and Family: Never    Attends Religious Services: Never    Marine scientist or Organizations: No    Attends Arts administrator: Never    Marital Status: Married    Review of Systems Per HPI  Objective:  BP (!) 158/73   Pulse 61   Temp 98.5 F (36.9 C)   Wt 218 lb 3.2 oz (99 kg)   SpO2 96%   BMI 30.43 kg/m      12/08/2021   10:46 AM 11/25/2021    3:12 PM 11/22/2021    1:32 PM  BP/Weight  Systolic BP 161  096  Diastolic BP 73  74  Wt. (Lbs) 218.2 220 225  BMI 30.43 kg/m2 30.68 kg/m2 31.38 kg/m2    Physical Exam Vitals and nursing note reviewed.  Constitutional:      General: He is not in acute distress.    Appearance: Normal appearance. He is not ill-appearing.  Eyes:     General:        Right eye: No discharge.        Left eye: No discharge.     Conjunctiva/sclera: Conjunctivae normal.  Cardiovascular:     Rate and Rhythm: Normal rate and regular rhythm.  Pulmonary:     Effort: Pulmonary effort is normal.     Breath sounds: Normal breath sounds. No wheezing or rales.  Neurological:     Mental Status: He is alert.  Psychiatric:        Mood and Affect: Mood normal.        Behavior: Behavior normal.     Lab Results  Component Value Date   WBC 6.2 08/17/2021   HGB 15.8 08/17/2021   HCT 46.4 08/17/2021   PLT 209 08/17/2021   GLUCOSE 132 (H) 11/18/2021   CHOL 132 03/09/2021   TRIG 73 03/09/2021   HDL 58 03/09/2021   LDLCALC 59 03/09/2021   ALT 18 06/23/2020   AST 19 06/23/2020   NA 143 11/18/2021   K 4.6 11/18/2021   CL 104 11/18/2021   CREATININE 0.88 11/18/2021   BUN 18 11/18/2021   CO2 24 11/18/2021   TSH 1.730 12/14/2020   PSA 1.86 06/23/2020   INR 1.0 06/04/2013   HGBA1C 6.2 (H) 11/18/2021     Assessment & Plan:   Problem List Items Addressed This Visit       Respiratory   Acute bronchitis    Treating with doxycycline.       Meds ordered this encounter  Medications   doxycycline (VIBRA-TABS) 100 MG tablet    Sig: Take 1 tablet (100 mg total) by mouth 2 (two) times daily.    Dispense:  14 tablet    Refill:  [place]

## 2021-12-14 ENCOUNTER — Encounter: Payer: Medicare Other | Admitting: Gastroenterology

## 2021-12-15 ENCOUNTER — Inpatient Hospital Stay
Admission: RE | Admit: 2021-12-15 | Discharge: 2021-12-15 | Disposition: A | Discharge status: Home / Self Care | Payer: Medicare Other | Source: Ambulatory Visit | Attending: Sports Medicine | Admitting: Sports Medicine

## 2021-12-15 ENCOUNTER — Other Ambulatory Visit: Payer: Medicare Other

## 2021-12-15 NOTE — Discharge Instructions (Signed)

## 2021-12-20 ENCOUNTER — Telehealth: Payer: Self-pay | Admitting: Family Medicine

## 2021-12-20 ENCOUNTER — Ambulatory Visit (INDEPENDENT_AMBULATORY_CARE_PROVIDER_SITE_OTHER): Payer: Medicare Other | Admitting: Family Medicine

## 2021-12-20 VITALS — BP 142/82 | HR 60 | Temp 98.2°F | Ht 71.0 in | Wt 214.0 lb

## 2021-12-20 DIAGNOSIS — R052 Subacute cough: Secondary | ICD-10-CM | POA: Diagnosis not present

## 2021-12-20 DIAGNOSIS — H538 Other visual disturbances: Secondary | ICD-10-CM

## 2021-12-20 DIAGNOSIS — H43392 Other vitreous opacities, left eye: Secondary | ICD-10-CM | POA: Diagnosis not present

## 2021-12-20 DIAGNOSIS — Z77098 Contact with and (suspected) exposure to other hazardous, chiefly nonmedicinal, chemicals: Secondary | ICD-10-CM

## 2021-12-20 MED ORDER — ONDANSETRON HCL 8 MG PO TABS: 8.0000 mg | ORAL_TABLET | Freq: Three times a day (TID) | ORAL | 3 refills | Status: DC | PRN

## 2021-12-20 NOTE — Telephone Encounter (Signed)
The patient needs urgent consultation with ophthalmology.  Recommend optometry or ophthalmology ideally if they could see him this week because of chemical exposure to the left eye with subjective flashing lights and some floaters concerning for possibility of threatened retinal detachment.  Please work together to connect with eye doctors to get him seen.

## 2021-12-20 NOTE — Patient Instructions (Signed)
We will work on setting him up with eye specialist  We will send in the nausea medicine  If your cough does not improve over the next 2 to 3 weeks please let me know

## 2021-12-20 NOTE — Progress Notes (Signed)
   Subjective:    Patient ID: [name], male    DOB: [birth year], 66 y.o.   MRN: [id number]  HPI Splashed cleaner in his left eye 2 days ago, has floaters and seeing lights Patient got exposed to a cleaning solution 2 days ago he denied it causing any problems or pain but he states later that night he started seeing flashing lights on the left side of his peripheral vision as well as some intermittent spells of sensing something in the dark within his visual field this floater moves rapidly across his field of vision he states he never had this before the chemical exposure he states he did wipe his eye and later that he washed it but never felt any severe pain Dizziness   New rx for anti nausea medicine that is non- drowzy He also relates intermittent dry cough associated with the recent illness he had feels it is getting better not coughing up any discolored phlegm   Review of Systems     Objective:   Physical Exam  Pupils responsive to light bilateral.  No hyphema noted.  No clouding noted.  Fluorescein drops were placed in the left eye with cobalt light no cloudiness or scratches were noted The eye is not tearing or red     Assessment & Plan:  1. Floater, vitreous, left Doubt due to cleaner splash No hyphema Pupils responsive to light Vision bilat 20/50 I believe the patient would benefit from seeing ophthalmology to have slit-lamp exam as well as retinal exam there is some concern that this could be a threatened retinal detachment 2. Subacute cough Monitor should resolve next 3 to 4 weeks if not then further work up pt to let us know No pneumonia detected on physical exam no need for chest x-ray currently I would recommend giving this some time.  Offered Tessalon he defers.  If not dramatically better within the next 3 to 4 weeks further work-up 3. Flashing lights seen opthomology Please see about

## 2021-12-21 NOTE — Telephone Encounter (Signed)
Urgent referral placed and referral coordinator contacted. Referral coordinator states that as long as insurance has vision they can call and set up appt with no referral needed. Pt contacted and verbalized understanding. Offered to call eye doctor for patient but pt states he will give them a call

## 2021-12-22 DIAGNOSIS — H01001 Unspecified blepharitis right upper eyelid: Secondary | ICD-10-CM | POA: Diagnosis not present

## 2021-12-22 DIAGNOSIS — E119 Type 2 diabetes mellitus without complications: Secondary | ICD-10-CM | POA: Diagnosis not present

## 2021-12-22 DIAGNOSIS — H43812 Vitreous degeneration, left eye: Secondary | ICD-10-CM | POA: Diagnosis not present

## 2021-12-22 DIAGNOSIS — H01002 Unspecified blepharitis right lower eyelid: Secondary | ICD-10-CM | POA: Diagnosis not present

## 2021-12-26 ENCOUNTER — Ambulatory Visit: Payer: Medicare Other | Admitting: Cardiology

## 2022-01-02 DIAGNOSIS — M25512 Pain in left shoulder: Secondary | ICD-10-CM | POA: Diagnosis not present

## 2022-01-02 DIAGNOSIS — M7552 Bursitis of left shoulder: Secondary | ICD-10-CM | POA: Diagnosis not present

## 2022-01-02 DIAGNOSIS — M25511 Pain in right shoulder: Secondary | ICD-10-CM | POA: Diagnosis not present

## 2022-01-02 DIAGNOSIS — M7551 Bursitis of right shoulder: Secondary | ICD-10-CM | POA: Diagnosis not present

## 2022-01-03 ENCOUNTER — Ambulatory Visit
Admission: RE | Admit: 2022-01-03 | Discharge: 2022-01-03 | Disposition: A | Discharge status: Home / Self Care | Payer: Medicare Other | Source: Ambulatory Visit | Attending: Sports Medicine | Admitting: Sports Medicine

## 2022-01-03 DIAGNOSIS — M5136 Other intervertebral disc degeneration, lumbar region: Secondary | ICD-10-CM | POA: Diagnosis not present

## 2022-01-03 DIAGNOSIS — Z0389 Encounter for observation for other suspected diseases and conditions ruled out: Secondary | ICD-10-CM | POA: Diagnosis not present

## 2022-01-03 DIAGNOSIS — M545 Low back pain, unspecified: Secondary | ICD-10-CM

## 2022-01-03 MED ORDER — DIAZEPAM 5 MG PO TABS
5.0000 mg | ORAL_TABLET | Freq: Once | ORAL | Status: AC
  Administered 2022-01-03: 5 mg via ORAL

## 2022-01-03 MED ORDER — ONDANSETRON HCL 4 MG/2ML IJ SOLN: 4.0000 mg | Freq: Once | INTRAMUSCULAR | Status: DC | PRN

## 2022-01-03 MED ORDER — MEPERIDINE HCL 50 MG/ML IJ SOLN: 50.0000 mg | Freq: Once | INTRAMUSCULAR | Status: DC | PRN

## 2022-01-03 MED ORDER — IOPAMIDOL (ISOVUE-M 200) INJECTION 41%
20.0000 mL | Freq: Once | INTRAMUSCULAR | Status: AC
  Administered 2022-01-03: 20 mL via INTRATHECAL

## 2022-01-03 NOTE — Discharge Instructions (Signed)

## 2022-01-06 ENCOUNTER — Other Ambulatory Visit (HOSPITAL_COMMUNITY): Payer: Self-pay | Admitting: Physician Assistant

## 2022-01-06 ENCOUNTER — Other Ambulatory Visit: Payer: Self-pay | Admitting: Physician Assistant

## 2022-01-06 DIAGNOSIS — S46002A Unspecified injury of muscle(s) and tendon(s) of the rotator cuff of left shoulder, initial encounter: Secondary | ICD-10-CM

## 2022-01-09 ENCOUNTER — Other Ambulatory Visit: Payer: Self-pay | Admitting: Family Medicine

## 2022-01-19 ENCOUNTER — Ambulatory Visit (INDEPENDENT_AMBULATORY_CARE_PROVIDER_SITE_OTHER): Payer: Medicare Other

## 2022-01-19 ENCOUNTER — Other Ambulatory Visit: Payer: Self-pay | Admitting: Family Medicine

## 2022-01-19 DIAGNOSIS — I442 Atrioventricular block, complete: Secondary | ICD-10-CM | POA: Diagnosis not present

## 2022-01-24 ENCOUNTER — Ambulatory Visit (HOSPITAL_COMMUNITY)
Admission: RE | Admit: 2022-01-24 | Discharge: 2022-01-24 | Disposition: A | Discharge status: Home / Self Care | Payer: Medicare Other | Source: Ambulatory Visit | Attending: Physician Assistant | Admitting: Physician Assistant

## 2022-01-24 ENCOUNTER — Encounter (HOSPITAL_COMMUNITY): Payer: Self-pay

## 2022-01-24 DIAGNOSIS — M25512 Pain in left shoulder: Secondary | ICD-10-CM | POA: Diagnosis not present

## 2022-01-24 DIAGNOSIS — M19012 Primary osteoarthritis, left shoulder: Secondary | ICD-10-CM | POA: Diagnosis not present

## 2022-01-24 DIAGNOSIS — S46002A Unspecified injury of muscle(s) and tendon(s) of the rotator cuff of left shoulder, initial encounter: Secondary | ICD-10-CM | POA: Insufficient documentation

## 2022-01-24 LAB — CUP PACEART REMOTE DEVICE CHECK
Battery Remaining Longevity: 22 mo
Battery Voltage: 2.91 V
Brady Statistic AP VP Percent: 46.28 %
Brady Statistic AP VS Percent: 0 %
Brady Statistic AS VP Percent: 53.71 %
Brady Statistic AS VS Percent: 0 %
Brady Statistic RA Percent Paced: 46.27 %
Brady Statistic RV Percent Paced: 99.98 %
Date Time Interrogation Session: 20231009152947
Implantable Lead Implant Date: 20151217
Implantable Lead Implant Date: 20151217
Implantable Lead Implant Date: 20151217
Implantable Lead Location: 753858
Implantable Lead Location: 753859
Implantable Lead Location: 753860
Implantable Lead Model: 4196
Implantable Lead Model: 5076
Implantable Lead Model: 5076
Implantable Pulse Generator Implant Date: 20151217
Lead Channel Impedance Value: 342 Ohm
Lead Channel Impedance Value: 380 Ohm
Lead Channel Impedance Value: 399 Ohm
Lead Channel Impedance Value: 437 Ohm
Lead Channel Impedance Value: 589 Ohm
Lead Channel Impedance Value: 589 Ohm
Lead Channel Impedance Value: 665 Ohm
Lead Channel Impedance Value: 665 Ohm
Lead Channel Impedance Value: 988 Ohm
Lead Channel Pacing Threshold Amplitude: 0.75 V
Lead Channel Pacing Threshold Amplitude: 0.75 V
Lead Channel Pacing Threshold Amplitude: 1 V
Lead Channel Pacing Threshold Pulse Width: 0.4 ms
Lead Channel Pacing Threshold Pulse Width: 0.4 ms
Lead Channel Pacing Threshold Pulse Width: 0.4 ms
Lead Channel Sensing Intrinsic Amplitude: 0.625 mV
Lead Channel Sensing Intrinsic Amplitude: 0.625 mV
Lead Channel Sensing Intrinsic Amplitude: 18 mV
Lead Channel Sensing Intrinsic Amplitude: 18 mV
Lead Channel Setting Pacing Amplitude: 2 V
Lead Channel Setting Pacing Amplitude: 2.25 V
Lead Channel Setting Pacing Amplitude: 2.5 V
Lead Channel Setting Pacing Pulse Width: 0.4 ms
Lead Channel Setting Pacing Pulse Width: 0.4 ms
Lead Channel Setting Sensing Sensitivity: 0.9 mV

## 2022-01-24 MED ORDER — SODIUM CHLORIDE (PF) 0.9 % IJ SOLN
INTRAMUSCULAR | Status: AC
  Administered 2022-01-24: 5 mL
  Filled 2022-01-24: qty 10

## 2022-01-24 MED ORDER — POVIDONE-IODINE 10 % EX SOLN
CUTANEOUS | Status: AC
  Administered 2022-01-24: 1
  Filled 2022-01-24: qty 14.8

## 2022-01-24 MED ORDER — LIDOCAINE HCL (PF) 1 % IJ SOLN
INTRAMUSCULAR | Status: AC
  Administered 2022-01-24: 4 mL
  Filled 2022-01-24: qty 5

## 2022-01-24 MED ORDER — IOHEXOL 180 MG/ML  SOLN
INTRAMUSCULAR | Status: AC
  Administered 2022-01-24: 15 mL
  Filled 2022-01-24: qty 20

## 2022-01-24 NOTE — Procedures (Signed)
Preprocedure Dx: Other injury of muscle(s) and tendon(s) of the rotator cuff of left shoulder, initial encounter Postprocedure Dx: Other injury of muscle(s) and tendon(s) of the rotator cuff of left shoulder, initial encounter Procedure  Fluoroscopically guided LEFT joint injection for CT arthrography Radiologist:  Thornton Papas Anesthesia:  3 ml of 1% lidocaine Injectate:  10 ml of standard CT arthrogram solution Fluoro time:  0 minutes 48 seconds EBL:   None Complications:  None

## 2022-01-25 DIAGNOSIS — D3610 Benign neoplasm of peripheral nerves and autonomic nervous system, unspecified: Secondary | ICD-10-CM | POA: Diagnosis not present

## 2022-01-25 DIAGNOSIS — Z6829 Body mass index (BMI) 29.0-29.9, adult: Secondary | ICD-10-CM | POA: Diagnosis not present

## 2022-01-26 NOTE — Progress Notes (Signed)
Remote pacemaker transmission.   

## 2022-02-01 ENCOUNTER — Other Ambulatory Visit: Payer: Self-pay | Admitting: Family Medicine

## 2022-02-02 ENCOUNTER — Ambulatory Visit (AMBULATORY_SURGERY_CENTER): Payer: Self-pay

## 2022-02-02 VITALS — Ht 71.0 in | Wt 213.0 lb

## 2022-02-02 DIAGNOSIS — Z86010 Personal history of colonic polyps: Secondary | ICD-10-CM

## 2022-02-02 MED ORDER — NA SULFATE-K SULFATE-MG SULF 17.5-3.13-1.6 GM/177ML PO SOLN: 1.0000 | ORAL | 0 refills | Status: DC

## 2022-02-02 NOTE — Progress Notes (Signed)
No egg or soy allergy known to patient  No issues known to pt with past sedation with any surgeries or procedures Patient denies ever being told they had issues or difficulty with intubation  No FH of Malignant Hyperthermia Pt is not on diet pills Pt is not on  home 02  Pt is not on blood thinners [off plavix since before 06/2021 cardiology visit] Pt denies issues with constipation  No A fib or A flutter. [Complete heart block - pacemaker.] Have any cardiac testing pending--denied Pt instructed to use Singlecare.com or GoodRx for a price reduction on prep    PV conducted over phone. Suprep instructions with [facility] coupon and sample consent mailed to verified address.   Patient's chart reviewed by Osvaldo Angst CNRA prior to previsit and patient appropriate for the [place].  Previsit completed and red dot placed by patient's name on their procedure day (on provider's schedule).

## 2022-02-09 ENCOUNTER — Encounter: Payer: Self-pay | Admitting: Certified Registered Nurse Anesthetist

## 2022-02-09 ENCOUNTER — Encounter: Payer: Self-pay | Admitting: Gastroenterology

## 2022-02-16 ENCOUNTER — Encounter: Payer: Self-pay | Admitting: Gastroenterology

## 2022-02-16 ENCOUNTER — Ambulatory Visit (AMBULATORY_SURGERY_CENTER): Payer: Medicare Other | Admitting: Gastroenterology

## 2022-02-16 VITALS — BP 156/77 | HR 60 | Temp 98.0°F | Resp 11 | Ht 71.0 in | Wt 213.0 lb

## 2022-02-16 DIAGNOSIS — D120 Benign neoplasm of cecum: Secondary | ICD-10-CM

## 2022-02-16 DIAGNOSIS — Z1211 Encounter for screening for malignant neoplasm of colon: Secondary | ICD-10-CM | POA: Diagnosis not present

## 2022-02-16 DIAGNOSIS — Z09 Encounter for follow-up examination after completed treatment for conditions other than malignant neoplasm: Secondary | ICD-10-CM | POA: Diagnosis not present

## 2022-02-16 DIAGNOSIS — D123 Benign neoplasm of transverse colon: Secondary | ICD-10-CM

## 2022-02-16 DIAGNOSIS — Z86010 Personal history of colonic polyps: Secondary | ICD-10-CM | POA: Diagnosis not present

## 2022-02-16 DIAGNOSIS — I2510 Atherosclerotic heart disease of native coronary artery without angina pectoris: Secondary | ICD-10-CM | POA: Diagnosis not present

## 2022-02-16 MED ORDER — SODIUM CHLORIDE 0.9 % IV SOLN: 500.0000 mL | Freq: Once | INTRAVENOUS | Status: DC

## 2022-02-16 NOTE — Progress Notes (Signed)
0945 BP  192/83, Labetalol given IV, MD update, vss

## 2022-02-16 NOTE — Progress Notes (Signed)
Pt's states no medical or surgical changes since previsit or office visit. 

## 2022-02-16 NOTE — Progress Notes (Signed)
Report given to PACU, vss 

## 2022-02-16 NOTE — Op Note (Signed)
[place] Patient Name: [name] Procedure Date: 02/16/2022 9:28 AM MRN: [id number] Endoscopist: Ladene Artist , MD, [id number] Age: 66 Referring MD:  Date of Birth: 01/18/1956 Gender: Male Account #: [id number] Procedure:                Colonoscopy Indications:              Surveillance: Personal history of adenomatous                            polyps on last colonoscopy > 5 years ago Medicines:                Monitored Anesthesia Care Procedure:                Pre-Anesthesia Assessment:                           - Prior to the procedure, a History and Physical                            was performed, and patient medications and                            allergies were reviewed. The patient's tolerance of                            previous anesthesia was also reviewed. The risks                            and benefits of the procedure and the sedation                            options and risks were discussed with the patient.                            All questions were answered, and informed consent                            was obtained. Prior Anticoagulants: The patient has                            taken no anticoagulant or antiplatelet agents. ASA                            Grade Assessment: III - A patient with severe                            systemic disease. After reviewing the risks and                            benefits, the patient was deemed in satisfactory                            condition to undergo the procedure.  After obtaining informed consent, the colonoscope                            was passed under direct vision. Throughout the                            procedure, the patient's blood pressure, pulse, and                            oxygen saturations were monitored continuously. The                            CF [id number] #[id number] was introduced through the anus                            and advanced to  the the cecum, identified by                            appendiceal orifice and ileocecal valve. The                            ileocecal valve, appendiceal orifice, and rectum                            were photographed. The quality of the bowel                            preparation was good. The colonoscopy was performed                            without difficulty. The patient tolerated the                            procedure well. Scope In: 9:44:51 AM Scope Out: 9:57:30 AM Scope Withdrawal Time: 0 hours 10 minutes 10 seconds  Total Procedure Duration: 0 hours 12 minutes 39 seconds  Findings:                 The perianal and digital rectal examinations were                            normal.                           A 4 mm polyp was found in the cecum. The polyp was                            sessile. The polyp was removed with a cold biopsy                            forceps. Resection and retrieval were complete.                           A 7 mm polyp was found in the transverse colon. The  polyp was sessile. The polyp was removed with a                            cold snare. Resection and retrieval were complete.                           A few small-mouthed diverticula were found in the                            left colon. There was no evidence of diverticular                            bleeding.                           Internal hemorrhoids were found during                            retroflexion. The hemorrhoids were small and Grade                            I (internal hemorrhoids that do not prolapse).                           The exam was otherwise without abnormality on                            direct and retroflexion views. Complications:            No immediate complications. Estimated blood loss:                            None. Estimated Blood Loss:     Estimated blood loss: none. Impression:               - One 4 mm polyp in the  cecum, removed with a cold                            biopsy forceps. Resected and retrieved.                           - One 7 mm polyp in the transverse colon, removed                            with a cold snare. Resected and retrieved.                           - Mild diverticulosis in the left colon.                           - Internal hemorrhoids.                           - The examination was otherwise normal on direct  and retroflexion views. Recommendation:           - Repeat colonoscopy after studies are complete for                            surveillance based on pathology results.                           - Patient has a contact number available for                            emergencies. The signs and symptoms of potential                            delayed complications were discussed with the                            patient. Return to normal activities tomorrow.                            Written discharge instructions were provided to the                            patient.                           - High fiber diet.                           - Continue present medications.                           - Await pathology results. Ladene Artist, MD 02/16/2022 10:00:50 AM This report has been signed electronically.

## 2022-02-16 NOTE — Patient Instructions (Signed)
Handouts Provided:  Polyps, Diverticulosis and High Fiber Diet  Follow High Fiber Diet  YOU HAD AN ENDOSCOPIC PROCEDURE TODAY AT [facility]:   Refer to the procedure report that was given to you for any specific questions about what was found during the examination.  If the procedure report does not answer your questions, please call your gastroenterologist to clarify.  If you requested that your care partner not be given the details of your procedure findings, then the procedure report has been included in a sealed envelope for you to review at your convenience later.  YOU SHOULD EXPECT: Some feelings of bloating in the abdomen. Passage of more gas than usual.  Walking can help get rid of the air that was put into your GI tract during the procedure and reduce the bloating. If you had a lower endoscopy (such as a colonoscopy or flexible sigmoidoscopy) you may notice spotting of blood in your stool or on the toilet paper. If you underwent a bowel prep for your procedure, you may not have a normal bowel movement for a few days.  Please Note:  You might notice some irritation and congestion in your nose or some drainage.  This is from the oxygen used during your procedure.  There is no need for concern and it should clear up in a day or so.  SYMPTOMS TO REPORT IMMEDIATELY:  Following lower endoscopy (colonoscopy or flexible sigmoidoscopy):  Excessive amounts of blood in the stool  Significant tenderness or worsening of abdominal pains  Swelling of the abdomen that is new, acute  Fever of 100F or higher  For urgent or emergent issues, a gastroenterologist can be reached at any hour by calling [phone]. Do not use MyChart messaging for urgent concerns.    DIET:  We do recommend a small meal at first, but then you may proceed to your regular diet.  Drink plenty of fluids but you should avoid alcoholic beverages for 24 hours.  ACTIVITY:  You should plan to take it easy for  the rest of today and you should NOT DRIVE or use heavy machinery until tomorrow (because of the sedation medicines used during the test).    FOLLOW UP: Our staff will call the number listed on your records the next business day following your procedure.  We will call around 7:15- 8:00 am to check on you and address any questions or concerns that you may have regarding the information given to you following your procedure. If we do not reach you, we will leave a message.     If any biopsies were taken you will be contacted by phone or by letter within the next 1-3 weeks.  Please call us at [phone] if you have not heard about the biopsies in 3 weeks.    SIGNATURES/CONFIDENTIALITY: You and/or your care partner have signed paperwork which will be entered into your electronic medical record.  These signatures attest to the fact that that the information above on your After Visit Summary has been reviewed and is understood.  Full responsibility of the confidentiality of this discharge information lies with you and/or your care-partner.

## 2022-02-16 NOTE — Progress Notes (Signed)
Called to room to assist during endoscopic procedure.  Patient ID and intended procedure confirmed with present staff. Received instructions for my participation in the procedure from the performing physician.  

## 2022-02-16 NOTE — Progress Notes (Signed)
Reviewed patient history and patients states he has had chest of 2 on scale of 1 to10, with 10 being the worse. Spoke with MD and updated. Vss.  Plan to do coloscopy.

## 2022-02-16 NOTE — Progress Notes (Signed)
History & Physical  Primary Care Physician:  Kathyrn Drown, MD Primary Gastroenterologist: Lucio Edward, MD  CHIEF COMPLAINT:  Personal history of colon polyps   HPI: [name] is a 66 y.o. male with a personal history of adenomatous colon polyps for surveillance colonoscopy.   Past Medical History:  Diagnosis Date   ADD (attention deficit disorder)    Rx-Adderall   Anginal pain ([place])    Anxiety    on meds   Aortic atherosclerosis ([place]) 05/27/2020   Seen on x-ray being treated with statin   AV block, 3rd degree ([place])    Colon polyps 2011   tubular adenoma by excisional biopsy during colonoscopy   Complete heart block ([place])    a. s/p PPM Placement in 12/2012 with Medtronic CRT-P placement in 03/2014   Coronary artery disease    a. s/p PCI of the RCA in 2014 b. 08/2018: patent RCA stent with new mid-LAD 85% stenosis (treated with PCI/DES) and 85% distal OM2 stenosis (med management recommended)   Degenerative disc disease, cervical    Depression    PTSD   DM (diabetes mellitus) ([place])    on meds   Gastrointestinal bleeding 10/15/2017   GERD (gastroesophageal reflux disease)    Hyperlipidemia    on meds   Hypertension    on meds   Pacemaker    Palpitations 2003   Holter in 2003: PACs and PVCs; negative stress nuclear test in 2005; right bundle branch block; echo in 2008-mild LVH, otherwise normal; 2008-negative stress nuclear test.   Pneumonia    Prostatitis    prostate calcifications by CT   Seasonal allergies    Sleep apnea    questionable diagnosis    Past Surgical History:  Procedure Laterality Date   ANTERIOR CERVICAL DECOMP/DISCECTOMY FUSION     BACK SURGERY     Biventricular pacemaker upgrade/ system revision  04/02/2014   removal of previous atrial and ventricular leads with placement of a new MDT Consulta CRT-P system at [facility] by Dr Violet Baldy   COLONOSCOPY  2017   MS-MAC-suprep(good)-leiomyoma/TA   COLONOSCOPY W/ POLYPECTOMY   2011   tubular adenoma   CORONARY BALLOON ANGIOPLASTY N/A 06/15/2020   Procedure: CORONARY BALLOON ANGIOPLASTY;  Surgeon: Jettie Booze, MD;  Location: [facility];  Service: Cardiovascular;  Laterality: N/A;   CORONARY STENT INTERVENTION N/A 08/22/2018   Procedure: CORONARY STENT INTERVENTION;  Surgeon: Troy Sine, MD;  Location: [place] [facility];  Service: Cardiovascular;  Laterality: N/A;   ESOPHAGOGASTRODUODENOSCOPY     Gastritis   INSERT / REPLACE / REMOVE PACEMAKER  12/17/2012   MDT Adapta L implanted by Dr Rayann Heman for mobitz II second degree AV block   INTRAVASCULAR ULTRASOUND/IVUS N/A 06/15/2020   Procedure: Intravascular Ultrasound/IVUS;  Surgeon: Jettie Booze, MD;  Location: [place] [facility];  Service: Cardiovascular;  Laterality: N/A;   KNEE SURGERY Right    LEFT HEART CATH AND CORONARY ANGIOGRAPHY N/A 08/22/2018   Procedure: LEFT HEART CATH AND CORONARY ANGIOGRAPHY;  Surgeon: Troy Sine, MD;  Location: [place] [facility];  Service: Cardiovascular;  Laterality: N/A;   LEFT HEART CATH AND CORONARY ANGIOGRAPHY N/A 06/15/2020   Procedure: LEFT HEART CATH AND CORONARY ANGIOGRAPHY;  Surgeon: Jettie Booze, MD;  Location: [place] [facility];  Service: Cardiovascular;  Laterality: N/A;   LEFT HEART CATH AND CORONARY ANGIOGRAPHY N/A 09/01/2021   Procedure: LEFT HEART CATH AND CORONARY ANGIOGRAPHY;  Surgeon: Troy Sine,  MD;  Location: [place] [facility];  Service: Cardiovascular;  Laterality: N/A;   LEFT HEART CATHETERIZATION WITH CORONARY ANGIOGRAM N/A 10/17/2012   Procedure: LEFT HEART CATHETERIZATION WITH CORONARY ANGIOGRAM;  Surgeon: Josue Hector, MD;  Location: [facility] C[facility] LAB;  Service: Cardiovascular;  Laterality: N/A;   LEFT HEART CATHETERIZATION WITH CORONARY ANGIOGRAM N/A 12/17/2012   Procedure: LEFT HEART CATHETERIZATION WITH CORONARY ANGIOGRAM;  Surgeon: Peter M Martinique, MD;  Location: [facility] CATH LAB;  Service: Cardiovascular;  Laterality:  N/A;   LEFT HEART CATHETERIZATION WITH CORONARY ANGIOGRAM N/A 06/11/2013   Procedure: LEFT HEART CATHETERIZATION WITH CORONARY ANGIOGRAM;  Surgeon: Wellington Hampshire, MD;  Location: [place] [facility];  Service: Cardiovascular;  Laterality: N/A;   PERCUTANEOUS CORONARY STENT INTERVENTION (PCI-S)  10/17/2012   Procedure: PERCUTANEOUS CORONARY STENT INTERVENTION (PCI-S);  Surgeon: Josue Hector, MD;  Location: [facility] CATH LAB;  Service: Cardiovascular;;   PERMANENT PACEMAKER INSERTION N/A 12/17/2012   Procedure: PERMANENT PACEMAKER INSERTION;  Surgeon: Thompson Grayer, MD;  Location: [facility] CATH LAB;  Service: Cardiovascular;  Laterality: N/A;   POLYPECTOMY  2017   TA   UPPER GASTROINTESTINAL ENDOSCOPY      Prior to Admission medications   Medication Sig Start Date End Date Taking? Authorizing Provider  amLODipine (NORVASC) 5 MG tablet Take 1 tablet (5 mg total) by mouth daily. 11/22/21  Yes Kathyrn Drown, MD  aspirin EC 81 MG tablet Take 81 mg by mouth daily. Swallow whole.   Yes [provider]  isosorbide mononitrate (IMDUR) 120 MG 24 hr tablet TAKE 1 TABLET(120 MG) BY MOUTH DAILY 05/16/21  Yes Branch, Alphonse Guild, MD  losartan (COZAAR) 100 MG tablet TAKE 1 TABLET(100 MG) BY MOUTH DAILY 01/10/22  Yes Luking, Scott A, MD  metFORMIN (GLUCOPHAGE) 500 MG tablet TAKE 1 TABLET BY MOUTH TWICE DAILY 01/19/22  Yes Luking, Scott A, MD  ondansetron (ZOFRAN) 8 MG tablet Take 1 tablet (8 mg total) by mouth every 8 (eight) hours as needed for nausea. 12/20/21  Yes Luking, Elayne Snare, MD  promethazine (PHENERGAN) 25 MG tablet TAKE 1 TABLET BY MOUTH TWICE DAILY AS NEEDED FOR NAUSEA 08/11/19  Yes Luking, Scott A, MD  rosuvastatin (CRESTOR) 10 MG tablet Take 1 tablet (10 mg total) by mouth daily. 11/14/21  Yes BranchAlphonse Guild, MD  acetaminophen (TYLENOL) 500 MG tablet Take 1,500 mg by mouth daily as needed for moderate pain.    [provider]  ALPRAZolam Duanne Moron) 0.5 MG tablet 1/2-1 twice daily as needed home use only  use sparingly 07/21/21   Luking, Elayne Snare, MD  HYDROmorphone (DILAUDID) 4 MG tablet Take 1 tablet (4 mg total) by mouth every 6 (six) hours as needed for severe pain. 07/21/21   Kathyrn Drown, MD  Multiple Vitamin (MULTIVITAMIN) tablet Take 1 tablet by mouth daily. Patient not taking: Reported on 02/02/2022    [provider]  nadolol (CORGARD) 80 MG tablet Take 1.5 tablets (120 mg total) by mouth daily. TAKE 1 TABLET(80 MG) BY MOUTH DAILY Patient taking differently: Take 120 mg by mouth daily. 07/27/21   Arnoldo Lenis, MD  nitroGLYCERIN (NITROSTAT) 0.4 MG SL tablet Place 1 tablet (0.4 mg total) under the tongue every 5 (five) minutes as needed for chest pain (MAX 3 TABLETS). 04/27/20   Kathyrn Drown, MD  ranolazine (RANEXA) 500 MG 12 hr tablet Take 1 tablet (500 mg total) by mouth 2 (two) times daily. 07/21/21   Strader, Fransisco Hertz, PA-C  Semaglutide,0.25 or 0.5MG/DOS, ([place],  0.25 OR 0.5 MG/DOSE,) 2 MG/3ML SOPN Inject 0.25 mg once a week for 4 weeks and 0.5 mg for the 2nd month Patient not taking: Reported on 02/02/2022 11/22/21   Kathyrn Drown, MD  tamsulosin (FLOMAX) 0.4 MG CAPS capsule TAKE 1 CAPSULE(0.4 MG) BY MOUTH DAILY 02/01/22   Luking, Elayne Snare, MD  Tetrahydrozoline HCl (VISINE OP) Place 1 drop into both eyes daily as needed (allergies).    [provider]    Current Outpatient Medications  Medication Sig Dispense Refill   amLODipine (NORVASC) 5 MG tablet Take 1 tablet (5 mg total) by mouth daily. 90 tablet 1   aspirin EC 81 MG tablet Take 81 mg by mouth daily. Swallow whole.     isosorbide mononitrate (IMDUR) 120 MG 24 hr tablet TAKE 1 TABLET(120 MG) BY MOUTH DAILY 90 tablet 1   losartan (COZAAR) 100 MG tablet TAKE 1 TABLET(100 MG) BY MOUTH DAILY 90 tablet 0   metFORMIN (GLUCOPHAGE) 500 MG tablet TAKE 1 TABLET BY MOUTH TWICE DAILY 60 tablet 3   ondansetron (ZOFRAN) 8 MG tablet Take 1 tablet (8 mg total) by mouth every 8 (eight) hours as needed for nausea. 15 tablet  3   promethazine (PHENERGAN) 25 MG tablet TAKE 1 TABLET BY MOUTH TWICE DAILY AS NEEDED FOR NAUSEA 30 tablet 5   rosuvastatin (CRESTOR) 10 MG tablet Take 1 tablet (10 mg total) by mouth daily. 30 tablet 6   acetaminophen (TYLENOL) 500 MG tablet Take 1,500 mg by mouth daily as needed for moderate pain.     ALPRAZolam (XANAX) 0.5 MG tablet 1/2-1 twice daily as needed home use only use sparingly 20 tablet 0   HYDROmorphone (DILAUDID) 4 MG tablet Take 1 tablet (4 mg total) by mouth every 6 (six) hours as needed for severe pain. 20 tablet 0   Multiple Vitamin (MULTIVITAMIN) tablet Take 1 tablet by mouth daily. (Patient not taking: Reported on 02/02/2022)     nadolol (CORGARD) 80 MG tablet Take 1.5 tablets (120 mg total) by mouth daily. TAKE 1 TABLET(80 MG) BY MOUTH DAILY (Patient taking differently: Take 120 mg by mouth daily.) 135 tablet 1   nitroGLYCERIN (NITROSTAT) 0.4 MG SL tablet Place 1 tablet (0.4 mg total) under the tongue every 5 (five) minutes as needed for chest pain (MAX 3 TABLETS). 25 tablet 3   ranolazine (RANEXA) 500 MG 12 hr tablet Take 1 tablet (500 mg total) by mouth 2 (two) times daily. 60 tablet 11   Semaglutide,0.25 or 0.5MG/DOS, (OZEMPIC, 0.25 OR 0.5 MG/DOSE,) 2 MG/3ML SOPN Inject 0.25 mg once a week for 4 weeks and 0.5 mg for the 2nd month (Patient not taking: Reported on 02/02/2022) 3 mL 2   tamsulosin (FLOMAX) 0.4 MG CAPS capsule TAKE 1 CAPSULE(0.4 MG) BY MOUTH DAILY 90 capsule 0   Tetrahydrozoline HCl (VISINE OP) Place 1 drop into both eyes daily as needed (allergies).     Current Facility-Administered Medications  Medication Dose Route Frequency Provider Last Rate Last Admin   0.9 %  sodium chloride infusion  500 mL Intravenous Once Ladene Artist, MD       sodium chloride flush (NS) 0.9 % injection 3 mL  3 mL Intravenous Q12H Arnoldo Lenis, MD        Allergies as of 02/16/2022 - Review Complete 02/16/2022  Allergen Reaction Noted   Morphine and related Itching and  Rash 05/13/2013   Lasix [furosemide]  04/20/2016   Latex Rash 07/01/2009    Family History  Problem Relation Age of Onset   Heart disease Mother    Hypertension Mother        Carotid disease   Prostate cancer Brother 77   Breast cancer Maternal Aunt    Colon cancer Neg Hx    Colon polyps Neg Hx    Rectal cancer Neg Hx    Stomach cancer Neg Hx    Esophageal cancer Neg Hx     Social History   Socioeconomic History   Marital status: Married    Spouse name: Not on file   Number of children: 3   Years of education: Not on file   Highest education level: Not on file  Occupational History   Not on file  Tobacco Use   Smoking status: Former    Packs/day: 2.50    Years: 40.00    Total pack years: 100.00    Types: Cigarettes    Start date: 04/18/1967    Quit date: 12/20/2003    Years since quitting: 18.1   Smokeless tobacco: Former  Scientific laboratory technician Use: Former  Substance and Sexual Activity   Alcohol use: Yes    Alcohol/week: 7.0 - 14.0 standard drinks of alcohol    Types: 7 - 14 Shots of liquor per week   Drug use: Not Currently    Types: Marijuana    Comment: 30 + years ago - "crank, marjiuanna, ect."   Sexual activity: Yes    Birth control/protection: Surgical  Other Topics Concern   Not on file  Social History Narrative   Retired Therapist, art.   Social Determinants of Health   Financial Resource Strain: Low Risk  (03/22/2021)   Overall Financial Resource Strain (CARDIA)    Difficulty of Paying Living Expenses: Not hard at all  Food Insecurity: No Food Insecurity (03/22/2021)   Hunger Vital Sign    Worried About Running Out of Food in the Last Year: Never true    Ran Out of Food in the Last Year: Never true  Transportation Needs: No Transportation Needs (03/22/2021)   PRAPARE - Hydrologist (Medical): No    Lack of Transportation (Non-Medical): No  Physical Activity: Insufficiently Active (03/22/2021)   Exercise Vital Sign    Days of  Exercise per Week: 2 days    Minutes of Exercise per Session: 30 min  Stress: Stress Concern Present (03/22/2021)   [place]    Feeling of Stress : Rather much  Social Connections: Socially Isolated (03/22/2021)   Social Connection and Isolation Panel [NHANES]    Frequency of Communication with Friends and Family: Never    Frequency of Social Gatherings with Friends and Family: Never    Attends Religious Services: Never    Marine scientist or Organizations: No    Attends Archivist Meetings: Never    Marital Status: Married  Human resources officer Violence: Not At Risk (03/22/2021)   Humiliation, Afraid, Rape, and Kick questionnaire    Fear of Current or Ex-Partner: No    Emotionally Abused: No    Physically Abused: No    Sexually Abused: No    Review of Systems:  All systems reviewed were negative except where noted in HPI.   Physical Exam: General:  Alert, well-developed, in NAD Head:  Normocephalic and atraumatic. Eyes:  Sclera clear, no icterus.   Conjunctiva pink. Ears:  Normal auditory acuity. Mouth:  No deformity or lesions.  Neck:  Supple; no  masses . Lungs:  Clear throughout to auscultation.   No wheezes, crackles, or rhonchi. No acute distress. Heart:  Regular rate and rhythm; no murmurs. Abdomen:  Soft, nondistended, nontender. No masses, hepatomegaly. No obvious masses.  Normal bowel .    Rectal:  Deferred   Msk:  Symmetrical without gross deformities.. Pulses:  Normal pulses noted. Extremities:  Without edema. Neurologic:  Alert and  oriented x4;  grossly normal neurologically. Skin:  Intact without significant lesions or rashes. Cervical Nodes:  No significant cervical adenopathy. Psych:  Alert and cooperative. Normal mood and affect.  Impression / Plan:   Personal history of adenomatous colon polyps for surveillance colonoscopy.  Pricilla Riffle. Fuller Plan  02/16/2022, 9:31 AM See Shea Evans,  [facility], to contact our on call provider

## 2022-02-17 ENCOUNTER — Telehealth: Payer: Self-pay | Admitting: *Deleted

## 2022-02-17 NOTE — Telephone Encounter (Signed)
  Follow up Call-     02/16/2022    9:09 AM 01/03/2022   12:28 PM  Call back number  Post procedure Call Back phone  # [phone]  Permission to leave phone message Yes      Patient questions:  Do you have a fever, pain , or abdominal swelling? No. Pain Score  0 *  Have you tolerated food without any problems? Yes.    Have you been able to return to your normal activities? Yes.    Do you have any questions about your discharge instructions: Diet   No. Medications  No. Follow up visit  No.  Do you have questions or concerns about your Care? No.  Actions: * If pain score is 4 or above: No action needed, pain <4.

## 2022-02-22 ENCOUNTER — Encounter (HOSPITAL_BASED_OUTPATIENT_CLINIC_OR_DEPARTMENT_OTHER): Payer: Self-pay | Admitting: Emergency Medicine

## 2022-02-22 ENCOUNTER — Other Ambulatory Visit: Payer: Self-pay

## 2022-02-22 ENCOUNTER — Emergency Department (HOSPITAL_BASED_OUTPATIENT_CLINIC_OR_DEPARTMENT_OTHER)
Admission: EM | Admit: 2022-02-22 | Discharge: 2022-02-22 | Disposition: A | Discharge status: Home / Self Care | Payer: Medicare Other | Attending: Emergency Medicine | Admitting: Emergency Medicine

## 2022-02-22 ENCOUNTER — Emergency Department (HOSPITAL_BASED_OUTPATIENT_CLINIC_OR_DEPARTMENT_OTHER): Payer: Medicare Other

## 2022-02-22 DIAGNOSIS — I10 Essential (primary) hypertension: Secondary | ICD-10-CM | POA: Insufficient documentation

## 2022-02-22 DIAGNOSIS — I2510 Atherosclerotic heart disease of native coronary artery without angina pectoris: Secondary | ICD-10-CM | POA: Diagnosis not present

## 2022-02-22 DIAGNOSIS — Z79899 Other long term (current) drug therapy: Secondary | ICD-10-CM | POA: Diagnosis not present

## 2022-02-22 DIAGNOSIS — Z7982 Long term (current) use of aspirin: Secondary | ICD-10-CM | POA: Diagnosis not present

## 2022-02-22 DIAGNOSIS — Z7984 Long term (current) use of oral hypoglycemic drugs: Secondary | ICD-10-CM | POA: Insufficient documentation

## 2022-02-22 DIAGNOSIS — E119 Type 2 diabetes mellitus without complications: Secondary | ICD-10-CM | POA: Insufficient documentation

## 2022-02-22 DIAGNOSIS — I493 Ventricular premature depolarization: Secondary | ICD-10-CM | POA: Diagnosis not present

## 2022-02-22 DIAGNOSIS — Z87891 Personal history of nicotine dependence: Secondary | ICD-10-CM | POA: Insufficient documentation

## 2022-02-22 DIAGNOSIS — R0789 Other chest pain: Secondary | ICD-10-CM | POA: Diagnosis not present

## 2022-02-22 DIAGNOSIS — R0602 Shortness of breath: Secondary | ICD-10-CM | POA: Insufficient documentation

## 2022-02-22 DIAGNOSIS — R5383 Other fatigue: Secondary | ICD-10-CM | POA: Diagnosis present

## 2022-02-22 LAB — COMPREHENSIVE METABOLIC PANEL
ALT: 18 U/L (ref 0–44)
AST: 14 U/L — ABNORMAL LOW (ref 15–41)
Albumin: 4.6 g/dL (ref 3.5–5.0)
Alkaline Phosphatase: 53 U/L (ref 38–126)
Anion gap: 10 (ref 5–15)
BUN: 16 mg/dL (ref 8–23)
CO2: 28 mmol/L (ref 22–32)
Calcium: 9.7 mg/dL (ref 8.9–10.3)
Chloride: 103 mmol/L (ref 98–111)
Creatinine, Ser: 1.02 mg/dL (ref 0.61–1.24)
GFR, Estimated: >60 mL/min (ref >60)
Glucose, Bld: 145 mg/dL — ABNORMAL HIGH (ref 70–99)
Potassium: 4 mmol/L (ref 3.5–5.1)
Sodium: 141 mmol/L (ref 135–145)
Total Bilirubin: 1.3 mg/dL — ABNORMAL HIGH (ref 0.3–1.2)
Total Protein: 6.8 g/dL (ref 6.5–8.1)

## 2022-02-22 LAB — PROTIME-INR
INR: 1 (ref 0.8–1.2)
Prothrombin Time: 12.7 seconds (ref 11.4–15.2)

## 2022-02-22 LAB — CBC WITH DIFFERENTIAL/PLATELET
Abs Immature Granulocytes: 0.02 10*3/uL (ref 0.00–0.07)
Basophils Absolute: 0.1 10*3/uL (ref 0.0–0.1)
Basophils Relative: 1 %
Eosinophils Absolute: 0.1 10*3/uL (ref 0.0–0.5)
Eosinophils Relative: 2 %
HCT: 44.5 % (ref 39.0–52.0)
Hemoglobin: 15.1 g/dL (ref 13.0–17.0)
Immature Granulocytes: 0 %
Lymphocytes Relative: 24 %
Lymphs Abs: 1.4 10*3/uL (ref 0.7–4.0)
MCH: 30.6 pg (ref 26.0–34.0)
MCHC: 33.9 g/dL (ref 30.0–36.0)
MCV: 90.1 fL (ref 80.0–100.0)
Monocytes Absolute: 0.4 10*3/uL (ref 0.1–1.0)
Monocytes Relative: 7 %
Neutro Abs: 3.8 10*3/uL (ref 1.7–7.7)
Neutrophils Relative %: 66 %
Platelets: 188 10*3/uL (ref 150–400)
RBC: 4.94 MIL/uL (ref 4.22–5.81)
RDW: 13.2 % (ref 11.5–15.5)
WBC: 5.8 10*3/uL (ref 4.0–10.5)
nRBC: 0 % (ref 0.0–0.2)

## 2022-02-22 LAB — TROPONIN I (HIGH SENSITIVITY)
Troponin I (High Sensitivity): 5 ng/L (ref <18)
Troponin I (High Sensitivity): 4 ng/L (ref <18)

## 2022-02-22 LAB — BRAIN NATRIURETIC PEPTIDE: B Natriuretic Peptide: 68.9 pg/mL (ref 0.0–100.0)

## 2022-02-22 NOTE — Discharge Instructions (Addendum)
We evaluated you for your low heart rate at home.  Your pacemaker is functioning properly, but occasionally you have a series of premature beats which don't effectively push blood to the rest of your body, so your actual pulse is lower. This can cause lightheadedness. Your other lab testing including cardiac enzymes are negative.  We monitored you on the heart monitor and these episodes were brief and only occurred twice while you are in the emergency department.  We discussed the findings with cardiology who feel that it is safe to go home, and follow-up in the pacemaker device clinic.  They will help coordinate follow-up.  These return if your symptoms worsen, become persistent, you develop any episodes of fainting, you develop severe chest pain or difficulty breathing, or any other concerning symptoms.

## 2022-02-22 NOTE — ED Notes (Signed)
Discharge instructions and follow up care reviewed and explained, pt verbalized understanding and had no further questions on d/c. Pt caox4, ambulatory, denying pain/SOB on d/c.

## 2022-02-22 NOTE — ED Triage Notes (Signed)
Pt arrives pov, slow gait, endorses waking and "not feeling well, I feel like Im falling. Endorses concern for problems with pacemaker." Endorses mid sternal chest tightness this am

## 2022-02-22 NOTE — ED Notes (Signed)
Patient records faxed from [facility].  BUN 33 on 01/24/22 Creat 1.50 on 01/24/22

## 2022-02-22 NOTE — ED Provider Notes (Signed)
[facility] EMERGENCY DEPT Provider Note  CSN: [id number] Arrival date & time: 02/22/22 0846  Chief Complaint(s) Chest Pain  HPI [name] is a 66 y.o. male with history of complete heart block status post pacemaker, coronary artery disease, hypertension, hyperlipidemia presenting to the emergency department with fatigue.  Patient reports fatigue since yesterday, associated with mild chest pressure, mild shortness of breath.  No nausea, vomiting, leg swelling, syncope.  He reports that he checked his heart rate and it was in the 40s earlier today.  He reports that his pacemaker he thinks is near the end of its life.  Symptoms are worse with exertion.  No fevers or chills.   Past Medical History Past Medical History:  Diagnosis Date   ADD (attention deficit disorder)    Rx-Adderall   Anginal pain ([place])    Anxiety    on meds   Aortic atherosclerosis ([place]) 05/27/2020   Seen on x-ray being treated with statin   AV block, 3rd degree ([place])    Colon polyps 2011   tubular adenoma by excisional biopsy during colonoscopy   Complete heart block ([place])    a. s/p PPM Placement in 12/2012 with Medtronic CRT-P placement in 03/2014   Coronary artery disease    a. s/p PCI of the RCA in 2014 b. 08/2018: patent RCA stent with new mid-LAD 85% stenosis (treated with PCI/DES) and 85% distal OM2 stenosis (med management recommended)   Degenerative disc disease, cervical    Depression    PTSD   DM (diabetes mellitus) ([place])    on meds   Gastrointestinal bleeding 10/15/2017   GERD (gastroesophageal reflux disease)    Hyperlipidemia    on meds   Hypertension    on meds   Pacemaker    Palpitations 2003   Holter in 2003: PACs and PVCs; negative stress nuclear test in 2005; right bundle branch block; echo in 2008-mild LVH, otherwise normal; 2008-negative stress nuclear test.   Pneumonia    Prostatitis    prostate calcifications by CT   Seasonal allergies    Sleep apnea     questionable diagnosis   Patient Active Problem List   Diagnosis Date Noted   Acute bronchitis 12/09/2021   Upper airway cough syndrome 12/14/2020   Aortic atherosclerosis ([place]) 05/27/2020   Stable angina pectoris    Hyperlipidemia 01/04/2016   Anxiety as acute reaction to exceptional stress 05/28/2015   Acquired complete AV block ([place]) 04/02/2014   Disorder of cardiac pacemaker system 04/02/2014   OSA (obstructive sleep apnea) 10/16/2013   BPH (benign prostatic hyperplasia) 08/25/2013   Chest pain of uncertain etiology 68/06/2120   Mobitz type II atrioventricular block 12/17/2012   Coronary artery disease involving native coronary artery of native heart with angina pectoris ([place]) 10/18/2012   S/P drug eluting coronary stent placement 10/18/2012   DOE (dyspnea on exertion) 12/26/2011   Hypertension    ADD (attention deficit disorder)    GERD (gastroesophageal reflux disease) 12/20/2011   Hx of adenomatous colonic polyps 12/20/2011   Home Medication(s) Prior to Admission medications   Medication Sig Start Date End Date Taking? Authorizing Provider  acetaminophen (TYLENOL) 500 MG tablet Take 1,500 mg by mouth daily as needed for moderate pain.    [provider]  ALPRAZolam Duanne Moron) 0.5 MG tablet 1/2-1 twice daily as needed home use only use sparingly 07/21/21   Luking, Elayne Snare, MD  amLODipine (NORVASC) 5 MG tablet Take 1 tablet (5 mg total) by mouth daily. 11/22/21  Kathyrn Drown, MD  aspirin EC 81 MG tablet Take 81 mg by mouth daily. Swallow whole.    [provider]  HYDROmorphone (DILAUDID) 4 MG tablet Take 1 tablet (4 mg total) by mouth every 6 (six) hours as needed for severe pain. 07/21/21   Kathyrn Drown, MD  isosorbide mononitrate (IMDUR) 120 MG 24 hr tablet TAKE 1 TABLET(120 MG) BY MOUTH DAILY 05/16/21   Arnoldo Lenis, MD  losartan (COZAAR) 100 MG tablet TAKE 1 TABLET(100 MG) BY MOUTH DAILY 01/10/22   Kathyrn Drown, MD  metFORMIN (GLUCOPHAGE) 500 MG  tablet TAKE 1 TABLET BY MOUTH TWICE DAILY 01/19/22   Kathyrn Drown, MD  nadolol (CORGARD) 80 MG tablet Take 1.5 tablets (120 mg total) by mouth daily. TAKE 1 TABLET(80 MG) BY MOUTH DAILY Patient taking differently: Take 120 mg by mouth daily. 07/27/21   Arnoldo Lenis, MD  nitroGLYCERIN (NITROSTAT) 0.4 MG SL tablet Place 1 tablet (0.4 mg total) under the tongue every 5 (five) minutes as needed for chest pain (MAX 3 TABLETS). 04/27/20   Kathyrn Drown, MD  ondansetron (ZOFRAN) 8 MG tablet Take 1 tablet (8 mg total) by mouth every 8 (eight) hours as needed for nausea. 12/20/21   Kathyrn Drown, MD  promethazine (PHENERGAN) 25 MG tablet TAKE 1 TABLET BY MOUTH TWICE DAILY AS NEEDED FOR NAUSEA 08/11/19   Kathyrn Drown, MD  ranolazine (RANEXA) 500 MG 12 hr tablet Take 1 tablet (500 mg total) by mouth 2 (two) times daily. 07/21/21   Strader, Fransisco Hertz, PA-C  rosuvastatin (CRESTOR) 10 MG tablet Take 1 tablet (10 mg total) by mouth daily. 11/14/21   Arnoldo Lenis, MD  Semaglutide,0.25 or 0.5MG/DOS, (OZEMPIC, 0.25 OR 0.5 MG/DOSE,) 2 MG/3ML SOPN Inject 0.25 mg once a week for 4 weeks and 0.5 mg for the 2nd month Patient not taking: Reported on 02/02/2022 11/22/21   Kathyrn Drown, MD  tamsulosin (FLOMAX) 0.4 MG CAPS capsule TAKE 1 CAPSULE(0.4 MG) BY MOUTH DAILY 02/01/22   Kathyrn Drown, MD  Tetrahydrozoline HCl (VISINE OP) Place 1 drop into both eyes daily as needed (allergies).    [provider]                                                                                                                                    Past Surgical History Past Surgical History:  Procedure Laterality Date   ANTERIOR CERVICAL DECOMP/DISCECTOMY FUSION     BACK SURGERY     Biventricular pacemaker upgrade/ system revision  04/02/2014   removal of previous atrial and ventricular leads with placement of a new MDT Consulta CRT-P system at [facility] by Dr [facility]   COLONOSCOPY  2017    MS-MAC-suprep(good)-leiomyoma/TA   COLONOSCOPY W/ POLYPECTOMY  2011   tubular adenoma   CORONARY BALLOON ANGIOPLASTY N/A 06/15/2020   Procedure: [place];  Surgeon:  Jettie Booze, MD;  Location: [facility] LAB;  Service: Cardiovascular;  Laterality: N/A;   CORONARY STENT INTERVENTION N/A 08/22/2018   Procedure: CORONARY STENT INTERVENTION;  Surgeon: Troy Sine, MD;  Location: [facility];  Service: Cardiovascular;  Laterality: N/A;   ESOPHAGOGASTRODUODENOSCOPY     Gastritis   INSERT / REPLACE / REMOVE PACEMAKER  12/17/2012   MDT Adapta L implanted by Dr Rayann Heman for mobitz II second degree AV block   INTRAVASCULAR ULTRASOUND/IVUS N/A 06/15/2020   Procedure: Intravascular Ultrasound/IVUS;  Surgeon: Jettie Booze, MD;  Location: [facility] LAB;  Service: Cardiovascular;  Laterality: N/A;   KNEE SURGERY Right    LEFT HEART CATH AND CORONARY ANGIOGRAPHY N/A 08/22/2018   Procedure: LEFT HEART CATH AND CORONARY ANGIOGRAPHY;  Surgeon: Troy Sine, MD;  Location: [facility] LAB;  Service: Cardiovascular;  Laterality: N/A;   LEFT HEART CATH AND CORONARY ANGIOGRAPHY N/A 06/15/2020   Procedure: LEFT HEART CATH AND CORONARY ANGIOGRAPHY;  Surgeon: Jettie Booze, MD;  Location: [facility] LAB;  Service: Cardiovascular;  Laterality: N/A;   LEFT HEART CATH AND CORONARY ANGIOGRAPHY N/A 09/01/2021   Procedure: LEFT HEART CATH AND CORONARY ANGIOGRAPHY;  Surgeon: Troy Sine, MD;  Location: [facility] LAB;  Service: Cardiovascular;  Laterality: N/A;   LEFT HEART CATHETERIZATION WITH CORONARY ANGIOGRAM N/A 10/17/2012   Procedure: LEFT HEART CATHETERIZATION WITH CORONARY ANGIOGRAM;  Surgeon: Josue Hector, MD;  Location: [facility] CATH LAB;  Service: Cardiovascular;  Laterality: N/A;   LEFT HEART CATHETERIZATION WITH CORONARY ANGIOGRAM N/A 12/17/2012   Procedure: LEFT HEART CATHETERIZATION WITH CORONARY ANGIOGRAM;  Surgeon: Peter M Martinique, MD;   Location: [facility] CATH LAB;  Service: Cardiovascular;  Laterality: N/A;   LEFT HEART CATHETERIZATION WITH CORONARY ANGIOGRAM N/A 06/11/2013   Procedure: LEFT HEART CATHETERIZATION WITH CORONARY ANGIOGRAM;  Surgeon: Wellington Hampshire, MD;  Location: [facility] CATH LAB;  Service: Cardiovascular;  Laterality: N/A;   PERCUTANEOUS CORONARY STENT INTERVENTION (PCI-S)  10/17/2012   Procedure: PERCUTANEOUS CORONARY STENT INTERVENTION (PCI-S);  Surgeon: Josue Hector, MD;  Location: [facility] CATH LAB;  Service: Cardiovascular;;   PERMANENT PACEMAKER INSERTION N/A 12/17/2012   Procedure: PERMANENT PACEMAKER INSERTION;  Surgeon: Thompson Grayer, MD;  Location: [facility] CATH LAB;  Service: Cardiovascular;  Laterality: N/A;   POLYPECTOMY  2017   TA   UPPER GASTROINTESTINAL ENDOSCOPY     Family History Family History  Problem Relation Age of Onset   Heart disease Mother    Hypertension Mother        Carotid disease   Prostate cancer Brother 35   Breast cancer Maternal Aunt    Colon cancer Neg Hx    Colon polyps Neg Hx    Rectal cancer Neg Hx    Stomach cancer Neg Hx    Esophageal cancer Neg Hx     Social History Social History   Tobacco Use   Smoking status: Former    Packs/day: 2.50    Years: 40.00    Total pack years: 100.00    Types: Cigarettes    Start date: 04/18/1967    Quit date: 12/20/2003    Years since quitting: 18.1   Smokeless tobacco: Former  Scientific laboratory technician Use: Former  Substance Use Topics   Alcohol use: Yes    Alcohol/week: 7.0 - 14.0 standard drinks of alcohol    Types: 7 - 14 Shots of liquor per week    Comment: rare   Drug  use: Not Currently    Types: Marijuana    Comment: 30 + years ago - "crank, marjiuanna, ect."   Allergies Morphine and related, Lasix [furosemide], and Latex  Review of Systems Review of Systems  All other systems reviewed and are negative.   Physical Exam Vital Signs  I have reviewed the triage vital signs BP (!) 154/83 (BP Location: Right Arm)   Pulse 60    Temp 97.7 F (36.5 C) (Oral)   Resp 15   Ht _0  (1.803 m)   Wt 97.5 kg   SpO2 97%   BMI 29.99 kg/m  Physical Exam Vitals and nursing note reviewed.  Constitutional:      General: He is not in acute distress.    Appearance: Normal appearance.  HENT:     Mouth/Throat:     Mouth: Mucous membranes are moist.  Eyes:     Conjunctiva/sclera: Conjunctivae normal.  Cardiovascular:     Rate and Rhythm: Normal rate and regular rhythm.  Pulmonary:     Effort: Pulmonary effort is normal. No respiratory distress.     Breath sounds: Normal breath sounds.  Abdominal:     General: Abdomen is flat.     Palpations: Abdomen is soft.     Tenderness: There is no abdominal tenderness.  Musculoskeletal:     Right lower leg: No edema.     Left lower leg: No edema.  Skin:    General: Skin is warm and dry.     Capillary Refill: Capillary refill takes less than 2 seconds.  Neurological:     Mental Status: He is alert and oriented to person, place, and time. Mental status is at baseline.  Psychiatric:        Mood and Affect: Mood normal.        Behavior: Behavior normal.     ED Results and Treatments Labs (all labs ordered are listed, but only abnormal results are displayed) Labs Reviewed  COMPREHENSIVE METABOLIC PANEL - Abnormal; Notable for the following components:      Result Value   Glucose, Bld 145 (*)    AST 14 (*)    Total Bilirubin 1.3 (*)    All other components within normal limits  CBC WITH DIFFERENTIAL/PLATELET  PROTIME-INR  BRAIN NATRIURETIC PEPTIDE  TROPONIN I (HIGH SENSITIVITY)  TROPONIN I (HIGH SENSITIVITY)                                                                                                                          Radiology DG Chest Port 1 View  Result Date: 02/22/2022 CLINICAL DATA:  Midsternal chest tightness. EXAM: PORTABLE CHEST 1 VIEW COMPARISON:  Chest radiograph 07/21/2021 FINDINGS: The left chest wall cardiac device and associated leads are  stable. The cardiomediastinal silhouette is stable Linear opacities in the lateral left base likely reflect atelectasis. There is no other focal airspace opacity. There is no pulmonary edema. There is no pleural effusion or pneumothorax There is no acute osseous  abnormality. IMPRESSION: No radiographic evidence of acute cardiopulmonary process. Electronically Signed   By: Valetta Mole M.D.   On: 02/22/2022 09:39    Pertinent labs & imaging results that were available during my care of the patient were reviewed by me and considered in my medical decision making (see MDM for details).  Medications Ordered in ED Medications - No data to display                                                                                                                                   Procedures Procedures  (including critical care time)  Medical Decision Making / ED Course   MDM:  66 year old male presenting to the emergency department with mild fatigue.  On exam, patient has regular rate.  His EKG shows AV paced rhythm with occasional PVCs.  Will maintain on cardiac monitor.  Differential includes pacemaker abnormality, metabolic abnormality, less likely ACS but will check troponin, doubt occult infectious process but given shortness of breath will check chest x-ray.  Will interrogate pacemaker, currently appears to have good capture on monitor and via EKG.  Doubt PE, no pleuritic pain.  Symptoms very mild, low concern for dissection.  Will reassess.  Clinical Course as of 02/22/22 1254  Wed Feb 22, 2022  1157 Work-up reassuring.  Reviewed pacemaker record, which shows good battery life, good function.  Reviewed telemetry data from ER visit.  Telemetry data does show periods where patient has a paced beat followed by a PVC in a bigeminy pattern with pulse rate of 30.  Will discuss with cardiology as patient is symptomatic during these episodes. [WS]  [id number] Discussed with Dr. Burt Knack -he left a brief note  in the chart.  He advised patient should follow-up with device clinic for further management.  His episodes of PVCs in a bigeminy pattern are overall brief and he has not had any syncopal events.  Will discharge patient.  Advised strict return precautions should he have worsening or persistent symptoms, or should he have any syncopal events or any other concerning symptoms. Will discharge patient to home. All questions answered. Patient comfortable with plan of discharge. Return precautions discussed with patient and specified on the after visit summary.  [WS]    Clinical Course User Index [WS] Cristie Hem, MD     Additional history obtained: -Additional history obtained from spouse -External records from outside source obtained and reviewed including: Chart review including previous notes, labs, imaging, consultation notes including device note 10/21/21   Lab Tests: -I ordered, reviewed, and interpreted labs.   The pertinent results include:   Labs Reviewed  COMPREHENSIVE METABOLIC PANEL - Abnormal; Notable for the following components:      Result Value   Glucose, Bld 145 (*)    AST 14 (*)    Total Bilirubin 1.3 (*)    All other components within normal limits  CBC WITH DIFFERENTIAL/PLATELET  PROTIME-INR  BRAIN NATRIURETIC PEPTIDE  TROPONIN I (HIGH SENSITIVITY)  TROPONIN I (HIGH SENSITIVITY)    Notable for normal troponin and normal BNP  EKG   EKG Interpretation  Date/Time:  Wednesday February 22 2022 08:52:20 EST Ventricular Rate:  71 PR Interval:  133 QRS Duration: 142 QT Interval:  441 QTC Calculation: 441 R Axis:   232 Text Interpretation: AV PACED RHYTHM Multiple ventricular premature complexes Confirmed by Garnette Gunner [id number]) on 02/22/2022 8:59:40 AM         Imaging Studies ordered: I ordered imaging studies including CXR On my interpretation imaging demonstrates no acute process I independently visualized and interpreted imaging. I agree with  the radiologist interpretation   Medicines ordered and prescription drug management: No orders of the defined types were placed in this encounter.   -I have reviewed the patients home medicines and have made adjustments as needed   Consultations Obtained: I requested consultation with the cardiologist,  and discussed lab and imaging findings as well as pertinent plan - they recommend: discharge, follow up with device clinic   Cardiac Monitoring: The patient was maintained on a cardiac monitor.  I personally viewed and interpreted the cardiac monitored which showed an underlying rhythm of: AV paced  Social Determinants of Health:  Diagnosis or treatment significantly limited by social determinants of health: former smoker   Reevaluation: After the interventions noted above, I reevaluated the patient and found that they have improved  Co morbidities that complicate the patient evaluation  Past Medical History:  Diagnosis Date   ADD (attention deficit disorder)    Rx-Adderall   Anginal pain ([place])    Anxiety    on meds   Aortic atherosclerosis ([place]) 05/27/2020   Seen on x-ray being treated with statin   AV block, 3rd degree ([place])    Colon polyps 2011   tubular adenoma by excisional biopsy during colonoscopy   Complete heart block ([place])    a. s/p PPM Placement in 12/2012 with Medtronic CRT-P placement in 03/2014   Coronary artery disease    a. s/p PCI of the RCA in 2014 b. 08/2018: patent RCA stent with new mid-LAD 85% stenosis (treated with PCI/DES) and 85% distal OM2 stenosis (med management recommended)   Degenerative disc disease, cervical    Depression    PTSD   DM (diabetes mellitus) ([name])    on meds   Gastrointestinal bleeding 10/15/2017   GERD (gastroesophageal reflux disease)    Hyperlipidemia    on meds   Hypertension    on meds   Pacemaker    Palpitations 2003   Holter in 2003: PACs and PVCs; negative stress nuclear test in 2005; right bundle branch block;  echo in 2008-mild LVH, otherwise normal; 2008-negative stress nuclear test.   Pneumonia    Prostatitis    prostate calcifications by CT   Seasonal allergies    Sleep apnea    questionable diagnosis      Dispostion: Disposition decision including need for hospitalization was considered, and patient discharged from emergency department.    Final Clinical Impression(s) / ED Diagnoses Final diagnoses:  Symptomatic PVCs     This chart was dictated using voice recognition software.  Despite best efforts to proofread,  errors can occur which can change the documentation meaning.    Cristie Hem, MD 02/22/22 1254

## 2022-02-22 NOTE — Progress Notes (Signed)
Spoke with Dr Truett Mainland at [facility] where [name] is being evaluated for lightheadedness and slow pulse.  EKG shows AV pacing with frequent PVCs.  The patient's device was interrogated and showed normal device function.  Labs are reviewed and his electrolytes are within normal limits, BNP is normal and high-sensitivity troponin is normal.  It sounds like he is otherwise well.  He is likely having symptomatic PVCs.  We will arrange close follow-up in our device clinic and will notify our EP team to get him in for evaluation.  No indication to hospitalize the patient.  [name] 02/22/2022 12:40 PM

## 2022-03-12 ENCOUNTER — Encounter: Payer: Self-pay | Admitting: Gastroenterology

## 2022-03-15 DIAGNOSIS — L821 Other seborrheic keratosis: Secondary | ICD-10-CM | POA: Diagnosis not present

## 2022-03-15 DIAGNOSIS — D485 Neoplasm of uncertain behavior of skin: Secondary | ICD-10-CM | POA: Diagnosis not present

## 2022-03-15 DIAGNOSIS — D225 Melanocytic nevi of trunk: Secondary | ICD-10-CM | POA: Diagnosis not present

## 2022-03-23 ENCOUNTER — Encounter: Payer: Self-pay | Admitting: Family Medicine

## 2022-03-24 ENCOUNTER — Encounter: Payer: Self-pay | Admitting: Cardiovascular Disease

## 2022-03-24 ENCOUNTER — Ambulatory Visit: Payer: Medicare Other | Attending: Cardiovascular Disease | Admitting: Cardiovascular Disease

## 2022-03-24 VITALS — BP 122/66 | HR 63 | Ht 71.0 in | Wt 222.2 lb

## 2022-03-24 DIAGNOSIS — I509 Heart failure, unspecified: Secondary | ICD-10-CM | POA: Diagnosis not present

## 2022-03-24 DIAGNOSIS — I442 Atrioventricular block, complete: Secondary | ICD-10-CM | POA: Diagnosis not present

## 2022-03-24 DIAGNOSIS — I493 Ventricular premature depolarization: Secondary | ICD-10-CM | POA: Diagnosis not present

## 2022-03-24 NOTE — Progress Notes (Signed)
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 male who presents today for routine electrophysiology followup.  Since last being seen in our clinic, the patient reports doing reasonably well.  Today, he denies symptoms of palpitations, chest pain, shortness of breath, lower extremity edema, dizziness, presyncope, or syncope.  He has noticed increased palpitations and fatigue for several months, possibly coinciding with his heart cath. The patient is otherwise without complaint today.   Past Medical History:  Diagnosis Date   ADD (attention deficit disorder)    Rx-Adderall   Anginal pain ([place])    Anxiety    on meds   Aortic atherosclerosis ([place]) 05/27/2020   Seen on x-ray being treated with statin   AV block, 3rd degree ([place])    Colon polyps 2011   tubular adenoma by excisional biopsy during colonoscopy   Complete heart block ([place])    a. s/p PPM Placement in 12/2012 with Medtronic CRT-P placement in 03/2014   Coronary artery disease    a. s/p PCI of the RCA in 2014 b. 08/2018: patent RCA stent with new mid-LAD 85% stenosis (treated with PCI/DES) and 85% distal OM2 stenosis (med management recommended)   Degenerative disc disease, cervical    Depression    PTSD   DM (diabetes mellitus) ([place])    on meds   Gastrointestinal bleeding 10/15/2017   GERD (gastroesophageal reflux disease)    Hyperlipidemia    on meds   Hypertension    on meds   Pacemaker    Palpitations 2003   Holter in 2003: PACs and PVCs; negative stress nuclear test in 2005; right bundle branch block; echo in 2008-mild LVH, otherwise normal; 2008-negative stress nuclear test.   Pneumonia    Prostatitis    prostate calcifications by CT   Seasonal allergies    Sleep apnea    questionable diagnosis   Past Surgical History:  Procedure Laterality Date   ANTERIOR CERVICAL DECOMP/DISCECTOMY FUSION     BACK SURGERY     Biventricular pacemaker  upgrade/ system revision  04/02/2014   removal of previous atrial and ventricular leads with placement of a new MDT Consulta CRT-P system at [facility] [place] by Dr Violet Baldy   COLONOSCOPY  2017   MS-MAC-suprep(good)-leiomyoma/TA   COLONOSCOPY W/ POLYPECTOMY  2011   tubular adenoma   CORONARY BALLOON ANGIOPLASTY N/A 06/15/2020   Procedure: CORONARY BALLOON ANGIOPLASTY;  Surgeon: Jettie Booze, MD;  Location: [place] [facility];  Service: Cardiovascular;  Laterality: N/A;   CORONARY STENT INTERVENTION N/A 08/22/2018   Procedure: CORONARY STENT INTERVENTION;  Surgeon: Troy Sine, MD;  Location: [place] [facility];  Service: Cardiovascular;  Laterality: N/A;   ESOPHAGOGASTRODUODENOSCOPY     Gastritis   INSERT / REPLACE / REMOVE PACEMAKER  12/17/2012   MDT Adapta L implanted by Dr Rayann Heman for mobitz II second degree AV block   INTRAVASCULAR ULTRASOUND/IVUS N/A 06/15/2020   Procedure: Intravascular Ultrasound/IVUS;  Surgeon: Jettie Booze, MD;  Location: [place] [facility];  Service: Cardiovascular;  Laterality: N/A;   KNEE SURGERY Right    LEFT HEART CATH AND CORONARY ANGIOGRAPHY N/A 08/22/2018   Procedure: LEFT HEART CATH AND CORONARY ANGIOGRAPHY;  Surgeon: Troy Sine, MD;  Location: [place] [facility];  Service: Cardiovascular;  Laterality: N/A;   LEFT HEART CATH AND CORONARY ANGIOGRAPHY N/A 06/15/2020   Procedure: LEFT HEART CATH AND CORONARY ANGIOGRAPHY;  Surgeon: Irish Lack,  Charlann Lange, MD;  Location: [place] [facility];  Service: Cardiovascular;  Laterality: N/A;   LEFT HEART CATH AND CORONARY ANGIOGRAPHY N/A 09/01/2021   Procedure: LEFT HEART CATH AND CORONARY ANGIOGRAPHY;  Surgeon: Troy Sine, MD;  Location: [place] [facility];  Service: Cardiovascular;  Laterality: N/A;   LEFT HEART CATHETERIZATION WITH CORONARY ANGIOGRAM N/A 10/17/2012   Procedure: LEFT HEART CATHETERIZATION WITH CORONARY ANGIOGRAM;  Surgeon: Josue Hector, MD;  Location[facility] CATH LAB;   Service: Cardiovascular;  Laterality: N/A;   LEFT HEART CATHETERIZATION WITH CORONARY ANGIOGRAM N/A 12/17/2012   Procedure: LEFT HEART CATHETERIZATION WITH CORONARY ANGIOGRAM;  Surgeon: Peter M Martinique, MD;  Location: [facility] CATH LAB;  Service: Cardiovascular;  Laterality: N/A;   LEFT HEART CATHETERIZATION WITH CORONARY ANGIOGRAM N/A 06/11/2013   Procedure: LEFT HEART CATHETERIZATION WITH CORONARY ANGIOGRAM;  Surgeon: Wellington Hampshire, MD;  Location: [place] CATH LAB;  Service: Cardiovascular;  Laterality: N/A;   PERCUTANEOUS CORONARY STENT INTERVENTION (PCI-S)  10/17/2012   Procedure: PERCUTANEOUS CORONARY STENT INTERVENTION (PCI-S);  Surgeon: Josue Hector, MD;  Location[facility] CATH LAB;  Service: Cardiovascular;;   PERMANENT PACEMAKER INSERTION N/A 12/17/2012   Procedure: PERMANENT PACEMAKER INSERTION;  Surgeon: Thompson Grayer, MD;  Location[facility] CATH LAB;  Service: Cardiovascular;  Laterality: N/A;   POLYPECTOMY  2017   TA   UPPER GASTROINTESTINAL ENDOSCOPY      ROS- all systems are reviewed and negative except as per HPI above  Current Outpatient Medications  Medication Sig Dispense Refill   acetaminophen (TYLENOL) 500 MG tablet Take 1,500 mg by mouth daily as needed for moderate pain.     ALPRAZolam (XANAX) 0.5 MG tablet 1/2-1 twice daily as needed home use only use sparingly 20 tablet 0   amLODipine (NORVASC) 5 MG tablet Take 1 tablet (5 mg total) by mouth daily. 90 tablet 1   aspirin EC 81 MG tablet Take 81 mg by mouth daily. Swallow whole.     HYDROmorphone (DILAUDID) 4 MG tablet Take 1 tablet (4 mg total) by mouth every 6 (six) hours as needed for severe pain. 20 tablet 0   isosorbide mononitrate (IMDUR) 120 MG 24 hr tablet TAKE 1 TABLET(120 MG) BY MOUTH DAILY 90 tablet 1   losartan (COZAAR) 100 MG tablet TAKE 1 TABLET(100 MG) BY MOUTH DAILY 90 tablet 0   metFORMIN (GLUCOPHAGE) 500 MG tablet TAKE 1 TABLET BY MOUTH TWICE DAILY 60 tablet 3   nadolol (CORGARD) 80 MG tablet Take 1.5 tablets (120 mg  total) by mouth daily. TAKE 1 TABLET(80 MG) BY MOUTH DAILY (Patient taking differently: Take 120 mg by mouth daily.) 135 tablet 1   nitroGLYCERIN (NITROSTAT) 0.4 MG SL tablet Place 1 tablet (0.4 mg total) under the tongue every 5 (five) minutes as needed for chest pain (MAX 3 TABLETS). 25 tablet 3   ondansetron (ZOFRAN) 8 MG tablet Take 1 tablet (8 mg total) by mouth every 8 (eight) hours as needed for nausea. 15 tablet 3   promethazine (PHENERGAN) 25 MG tablet TAKE 1 TABLET BY MOUTH TWICE DAILY AS NEEDED FOR NAUSEA 30 tablet 5   ranolazine (RANEXA) 500 MG 12 hr tablet Take 1 tablet (500 mg total) by mouth 2 (two) times daily. 60 tablet 11   rosuvastatin (CRESTOR) 10 MG tablet Take 1 tablet (10 mg total) by mouth daily. 30 tablet 6   tamsulosin (FLOMAX) 0.4 MG CAPS capsule TAKE 1 CAPSULE(0.4 MG) BY MOUTH DAILY 90 capsule 0   Tetrahydrozoline HCl (VISINE OP) Place  1 drop into both eyes daily as needed (allergies).     Semaglutide,0.25 or 0.5MG/DOS, (OZEMPIC, 0.25 OR 0.5 MG/DOSE,) 2 MG/3ML SOPN Inject 0.25 mg once a week for 4 weeks and 0.5 mg for the 2nd month (Patient not taking: Reported on 03/24/2022) 3 mL 2   Current Facility-Administered Medications  Medication Dose Route Frequency Provider Last Rate Last Admin   sodium chloride flush (NS) 0.9 % injection 3 mL  3 mL Intravenous Q12H Arnoldo Lenis, MD        Physical Exam: Vitals:   03/24/22 1042  BP: 122/66  Pulse: 63  SpO2: 96%  Weight: 222 lb 3.2 oz (100.8 kg)  Height: _0  (1.803 m)    GEN- The patient is well appearing, alert and oriented x 3 today.   Lungs- Clear to ausculation bilaterally, normal work of breathing Chest- pacemaker pocket is well healed Heart- Regular rate and rhythm, no murmurs, rubs or gallops Extremities- no clubbing, cyanosis, or edema  Pacemaker interrogation- reviewed in detail today,  See PACEART report   Assessment and Plan:  1. Symptomatic complete heart block Normal pacemaker function   See Pace Art report No changes today he is device dependant today  2. PVCs: Occurring about 100 per hour avg. Often in bigeminy. RBBB morphology, - in inferior leads Stop ranolazine. If no improvement, will change betablocker.  3. Fatigue: will repeat TTE  4. CAD S/p cath 5/23 which showed nonobstructive dz Previously s/p PTCA of mid RCA  5. HTN Stable No change required today  6. HL On statin  Return in 1 month, after TTE  Melida Quitter, MD  03/24/2022 11:04 AM

## 2022-03-24 NOTE — Patient Instructions (Signed)
Medication Instructions:  Stop Ranexa (Ranolazine) Continue all other medications.     Labwork: none  Testing/Procedures: Your physician has requested that you have an echocardiogram. Echocardiography is a painless test that uses sound waves to create images of your heart. It provides your doctor with information about the size and shape of your heart and how well your heart's chambers and valves are working. This procedure takes approximately one hour. There are no restrictions for this procedure. Please do NOT wear cologne, perfume, aftershave, or lotions (deodorant is allowed). Please arrive 15 minutes prior to your appointment time. Office will contact with results via phone, letter or mychart.     Follow-Up: 1 month   Any Other Special Instructions Will Be Listed Below (If Applicable).   If you need a refill on your cardiac medications before your next appointment, please call your pharmacy.

## 2022-03-30 ENCOUNTER — Encounter: Payer: Self-pay | Admitting: Cardiovascular Disease

## 2022-03-31 ENCOUNTER — Ambulatory Visit (INDEPENDENT_AMBULATORY_CARE_PROVIDER_SITE_OTHER): Payer: Medicare Other

## 2022-03-31 VITALS — Ht 71.0 in | Wt 220.0 lb

## 2022-03-31 DIAGNOSIS — Z0000 Encounter for general adult medical examination without abnormal findings: Secondary | ICD-10-CM | POA: Diagnosis not present

## 2022-03-31 NOTE — Progress Notes (Signed)
Subjective:   [name] is a 66 y.o. male who presents for Medicare Annual/Subsequent preventive examination.  I connected with  Brysin Towery Hollenbeck on 03/31/22 by a audio enabled telemedicine application and verified that I am speaking with the correct person using two identifiers.  Patient Location: Home  Provider Location: Office/Clinic  I discussed the limitations of evaluation and management by telemedicine. The patient expressed understanding and agreed to proceed.  Review of Systems     Cardiac Risk Factors include: advanced age (>49mn, >>17women);dyslipidemia;male gender;hypertension     Objective:    Today's Vitals   03/31/22 1314  Weight: 220 lb (99.8 kg)  Height: _0  (1.803 m)   Body mass index is 30.68 kg/m.     03/31/2022    1:36 PM 02/22/2022    8:55 AM 09/01/2021    8:54 AM 03/22/2021    1:50 PM 06/15/2020    9:01 AM 08/22/2018    7:32 AM 03/21/2018    9:51 AM  Advanced Directives  Does Patient Have a Medical Advance Directive? Yes No _1   Type of Advance Directive Living will;Healthcare Power of [place] Living [place]care [place] of [place] Living will  Does patient want to make changes to medical advance directive? No - Patient declined    No - Guardian declined No - Patient declined   Copy of [place]? No - copy requested   No - copy requested No - copy requested      Current Medications (verified) Outpatient Encounter Medications as of 03/31/2022  Medication Sig   acetaminophen (TYLENOL) 500 MG tablet Take 1,500 mg by mouth daily as needed for moderate pain.   ALPRAZolam (XANAX) 0.5 MG tablet 1/2-1 twice daily as needed home use only use sparingly   amLODipine (NORVASC) 5 MG tablet Take 1 tablet (5 mg total) by mouth daily.   aspirin EC 81 MG tablet Take 81 mg by mouth daily. Swallow whole.   HYDROmorphone  (DILAUDID) 4 MG tablet Take 1 tablet (4 mg total) by mouth every 6 (six) hours as needed for severe pain.   isosorbide mononitrate (IMDUR) 120 MG 24 hr tablet TAKE 1 TABLET(120 MG) BY MOUTH DAILY   losartan (COZAAR) 100 MG tablet TAKE 1 TABLET(100 MG) BY MOUTH DAILY   metFORMIN (GLUCOPHAGE) 500 MG tablet TAKE 1 TABLET BY MOUTH TWICE DAILY   nadolol (CORGARD) 80 MG tablet Take 1.5 tablets (120 mg total) by mouth daily. TAKE 1 TABLET(80 MG) BY MOUTH DAILY (Patient taking differently: Take 120 mg by mouth daily.)   nitroGLYCERIN (NITROSTAT) 0.4 MG SL tablet Place 1 tablet (0.4 mg total) under the tongue every 5 (five) minutes as needed for chest pain (MAX 3 TABLETS).   ondansetron (ZOFRAN) 8 MG tablet Take 1 tablet (8 mg total) by mouth every 8 (eight) hours as needed for nausea.   promethazine (PHENERGAN) 25 MG tablet TAKE 1 TABLET BY MOUTH TWICE DAILY AS NEEDED FOR NAUSEA   rosuvastatin (CRESTOR) 10 MG tablet Take 1 tablet (10 mg total) by mouth daily.   Semaglutide,0.25 or 0.5MG/DOS, (OZEMPIC, 0.25 OR 0.5 MG/DOSE,) 2 MG/3ML SOPN Inject 0.25 mg once a week for 4 weeks and 0.5 mg for the 2nd month   tamsulosin (FLOMAX) 0.4 MG CAPS capsule TAKE 1 CAPSULE(0.4 MG) BY MOUTH DAILY   Tetrahydrozoline HCl (VISINE OP) Place 1 drop into both eyes daily as needed (  allergies).   Facility-Administered Encounter Medications as of 03/31/2022  Medication   sodium chloride flush (NS) 0.9 % injection 3 mL    Allergies (verified) Morphine and related, Lasix [furosemide], and Latex   History: Past Medical History:  Diagnosis Date   ADD (attention deficit disorder)    Rx-Adderall   Anginal pain (HCC)    Anxiety    on meds   Aortic atherosclerosis ([place]) 05/27/2020   Seen on x-ray being treated with statin   AV block, 3rd degree ([place])    Colon polyps 2011   tubular adenoma by excisional biopsy during colonoscopy   Complete heart block ([place])    a. s/p PPM Placement in 12/2012 with Medtronic CRT-P placement  in 03/2014   Coronary artery disease    a. s/p PCI of the RCA in 2014 b. 08/2018: patent RCA stent with new mid-LAD 85% stenosis (treated with PCI/DES) and 85% distal OM2 stenosis (med management recommended)   Degenerative disc disease, cervical    Depression    PTSD   DM (diabetes mellitus) ([place])    on meds   Gastrointestinal bleeding 10/15/2017   GERD (gastroesophageal reflux disease)    Hyperlipidemia    on meds   Hypertension    on meds   Pacemaker    Palpitations 2003   Holter in 2003: PACs and PVCs; negative stress nuclear test in 2005; right bundle branch block; echo in 2008-mild LVH, otherwise normal; 2008-negative stress nuclear test.   Pneumonia    Prostatitis    prostate calcifications by CT   Seasonal allergies    Sleep apnea    questionable diagnosis   Past Surgical History:  Procedure Laterality Date   ANTERIOR CERVICAL DECOMP/DISCECTOMY FUSION     BACK SURGERY     Biventricular pacemaker upgrade/ system revision  04/02/2014   removal of previous atrial and ventricular leads with placement of a new MDT Consulta CRT-P system at [facility] by Dr Violet Baldy   COLONOSCOPY  2017   MS-MAC-suprep(good)-leiomyoma/TA   COLONOSCOPY W/ POLYPECTOMY  2011   tubular adenoma   CORONARY BALLOON ANGIOPLASTY N/A 06/15/2020   Procedure: CORONARY BALLOON ANGIOPLASTY;  Surgeon: Jettie Booze, MD;  Location: [place] [facility];  Service: Cardiovascular;  Laterality: N/A;   CORONARY STENT INTERVENTION N/A 08/22/2018   Procedure: CORONARY STENT INTERVENTION;  Surgeon: Troy Sine, MD;  Location: [place] [facility];  Service: Cardiovascular;  Laterality: N/A;   ESOPHAGOGASTRODUODENOSCOPY     Gastritis   INSERT / REPLACE / REMOVE PACEMAKER  12/17/2012   MDT Adapta L implanted by Dr Rayann Heman for mobitz II second degree AV block   INTRAVASCULAR ULTRASOUND/IVUS N/A 06/15/2020   Procedure: Intravascular Ultrasound/IVUS;  Surgeon: Jettie Booze, MD;  Location:  [place] [facility];  Service: Cardiovascular;  Laterality: N/A;   KNEE SURGERY Right    LEFT HEART CATH AND CORONARY ANGIOGRAPHY N/A 08/22/2018   Procedure: LEFT HEART CATH AND CORONARY ANGIOGRAPHY;  Surgeon: Troy Sine, MD;  Location: [place] [facility];  Service: Cardiovascular;  Laterality: N/A;   LEFT HEART CATH AND CORONARY ANGIOGRAPHY N/A 06/15/2020   Procedure: LEFT HEART CATH AND CORONARY ANGIOGRAPHY;  Surgeon: Jettie Booze, MD;  Location: [place] [facility];  Service: Cardiovascular;  Laterality: N/A;   LEFT HEART CATH AND CORONARY ANGIOGRAPHY N/A 09/01/2021   Procedure: LEFT HEART CATH AND CORONARY ANGIOGRAPHY;  Surgeon: Troy Sine, MD;  Location: [place] [facility];  Service: Cardiovascular;  Laterality: N/A;  LEFT HEART CATHETERIZATION WITH CORONARY ANGIOGRAM N/A 10/17/2012   Procedure: LEFT HEART CATHETERIZATION WITH CORONARY ANGIOGRAM;  Surgeon: Josue Hector, MD;  Location: [facility] CATH LAB;  Service: Cardiovascular;  Laterality: N/A;   LEFT HEART CATHETERIZATION WITH CORONARY ANGIOGRAM N/A 12/17/2012   Procedure: LEFT HEART CATHETERIZATION WITH CORONARY ANGIOGRAM;  Surgeon: Peter M Martinique, MD;  Location: [facility] CATH LAB;  Service: Cardiovascular;  Laterality: N/A;   LEFT HEART CATHETERIZATION WITH CORONARY ANGIOGRAM N/A 06/11/2013   Procedure: LEFT HEART CATHETERIZATION WITH CORONARY ANGIOGRAM;  Surgeon: Wellington Hampshire, MD;  Location: [place] CATH LAB;  Service: Cardiovascular;  Laterality: N/A;   PERCUTANEOUS CORONARY STENT INTERVENTION (PCI-S)  10/17/2012   Procedure: PERCUTANEOUS CORONARY STENT INTERVENTION (PCI-S);  Surgeon: Josue Hector, MD;  Location: [facility] CATH LAB;  Service: Cardiovascular;;   PERMANENT PACEMAKER INSERTION N/A 12/17/2012   Procedure: PERMANENT PACEMAKER INSERTION;  Surgeon: Thompson Grayer, MD;  Location: [facility] CATH LAB;  Service: Cardiovascular;  Laterality: N/A;   POLYPECTOMY  2017   TA   UPPER GASTROINTESTINAL ENDOSCOPY     Family History  Problem  Relation Age of Onset   Heart disease Mother    Hypertension Mother        Carotid disease   Prostate cancer Brother 7   Breast cancer Maternal Aunt    Colon cancer Neg Hx    Colon polyps Neg Hx    Rectal cancer Neg Hx    Stomach cancer Neg Hx    Esophageal cancer Neg Hx    Social History   Socioeconomic History   Marital status: Married    Spouse name: Not on file   Number of children: 3   Years of education: Not on file   Highest education level: Not on file  Occupational History   Not on file  Tobacco Use   Smoking status: Former    Packs/day: 2.50    Years: 40.00    Total pack years: 100.00    Types: Cigarettes    Start date: 04/18/1967    Quit date: 12/20/2003    Years since quitting: 18.2   Smokeless tobacco: Former  Scientific laboratory technician Use: Former  Substance and Sexual Activity   Alcohol use: Yes    Alcohol/week: 7.0 - 14.0 standard drinks of alcohol    Types: 7 - 14 Shots of liquor per week    Comment: rare   Drug use: Not Currently    Types: Marijuana    Comment: 30 + years ago - "crank, marjiuanna, ect."   Sexual activity: Yes    Birth control/protection: Surgical  Other Topics Concern   Not on file  Social History Narrative   Retired Therapist, art.   Social Determinants of Health   Financial Resource Strain: Low Risk  (03/31/2022)   Overall Financial Resource Strain (CARDIA)    Difficulty of Paying Living Expenses: Not hard at all  Food Insecurity: No Food Insecurity (03/31/2022)   Hunger Vital Sign    Worried About Running Out of Food in the Last Year: Never true    Ran Out of Food in the Last Year: Never true  Transportation Needs: No Transportation Needs (03/31/2022)   PRAPARE - Hydrologist (Medical): No    Lack of Transportation (Non-Medical): No  Physical Activity: Insufficiently Active (03/31/2022)   Exercise Vital Sign    Days of Exercise per Week: 2 days    Minutes of Exercise per Session: 30 min  Stress: No Stress  Concern  Present (03/31/2022)   [place]    Feeling of Stress : Not at all  Social Connections: Moderately Isolated (03/31/2022)   Social Connection and Isolation Panel [NHANES]    Frequency of Communication with Friends and Family: More than three times a week    Frequency of Social Gatherings with Friends and Family: Once a week    Attends Religious Services: Never    Marine scientist or Organizations: No    Attends Music therapist: Never    Marital Status: Married    Tobacco Counseling Counseling given: Not Answered   Clinical Intake:  Pre-visit preparation completed: Yes  Pain : No/denies pain  Diabetes: No  How often do you need to have someone help you when you read instructions, pamphlets, or other written materials from your doctor or pharmacy?: 1 - Never  Diabetic?No  Interpreter Needed?: No  Information entered by :: Denman George LPN   Activities of Daily Living    03/31/2022    1:37 PM  In your present state of health, do you have any difficulty performing the following activities:  Hearing? 0  Vision? 0  Difficulty concentrating or making decisions? 0  Walking or climbing stairs? 0  Dressing or bathing? 0  Doing errands, shopping? 0  Preparing Food and eating ? N  Using the Toilet? N  In the past six months, have you accidently leaked urine? N  Do you have problems with loss of bowel control? N  Managing your Medications? N  Managing your Finances? N  Housekeeping or managing your Housekeeping? N    Patient Care Team: Kathyrn Drown, MD as PCP - General (Family Medicine) Branch, Alphonse Guild, MD as PCP - Cardiology (Cardiology) Mealor, Yetta Barre, MD as PCP - Electrophysiology (Cardiology) Rothbart, Cristopher Estimable, MD (Inactive) (Cardiology) Harl Bowie Alphonse Guild, MD as Consulting Physician (Cardiology)  Indicate any recent Medical Services you may have received from  other than [facility] providers in the past year (date may be approximate).     Assessment:   This is a routine wellness examination for Cobie.  Hearing/Vision screen Hearing Screening - Comments:: No concerns at this time  Vision Screening - Comments:: Up to date with routine eye exams with [facility]    Dietary issues and exercise activities discussed: Current Exercise Habits: Home exercise routine, Type of exercise: walking, Time (Minutes): 30, Frequency (Times/Week): 3, Weekly Exercise (Minutes/Week): 90, Intensity: Mild   Goals Addressed             This Visit's Progress    Increase physical activity        Depression Screen    03/31/2022    1:35 PM 08/16/2021    3:42 PM 03/22/2021    1:42 PM 10/27/2020   10:34 AM 07/12/2020    8:27 AM 05/27/2020    9:26 AM 07/09/2019    2:43 PM  PHQ 2/9 Scores  PHQ - 2 Score 0 0 2 0 0 0   PHQ- 9 Score   5      Exception Documentation       Patient refusal    Fall Risk    03/31/2022    1:34 PM 08/16/2021    3:42 PM 03/22/2021    1:53 PM 03/08/2021    7:43 PM 01/24/2021    9:45 AM  [place] in the past year? 0 0 0 0 0  Number falls in past yr:  0 0 0 0 0  Injury with Fall? 0 0 0  0  Risk for fall due to : No Fall Risks No Fall Risks Impaired balance/gait;Impaired mobility  No Fall Risks  Follow up Falls evaluation completed;Education provided;Falls prevention discussed Falls evaluation completed Falls prevention discussed  Falls evaluation completed    FALL RISK PREVENTION PERTAINING TO THE HOME:  Any stairs in or around the home? Yes  If so, are there any without handrails? No  Home free of loose throw rugs in walkways, pet beds, electrical cords, etc? Yes  Adequate lighting in your home to reduce risk of falls? Yes   ASSISTIVE DEVICES UTILIZED TO PREVENT FALLS:  Life alert? No  Use of a cane, walker or w/c? No  Grab bars in the bathroom? Yes  Shower chair or bench in shower? No  Elevated toilet seat or a  handicapped toilet? No   TIMED UP AND GO:  Was the test performed? No .  Length of time to ambulate 10 feet: telephonic visit    Cognitive Function:        03/31/2022    1:37 PM  6CIT Screen  What Year? 0 points  What month? 0 points  What time? 0 points  Count back from 20 0 points  Months in reverse 0 points  Repeat phrase 0 points  Total Score 0 points    Immunizations Immunization History  Administered Date(s) Administered   Covid-19, Mrna,Vaccine(Spikevax)63yr and older 03/06/2022   Fluad Quad(high Dose 65+) 01/24/2021   Influenza Inj Mdck Quad With Preservative 01/30/2018   Influenza Split 02/05/2013   Influenza, High Dose Seasonal PF 01/30/2022   Influenza,inj,Quad PF,6+ Mos 01/20/2014, 01/19/2017   Influenza-Unspecified 01/16/2012, 01/15/2018, 02/12/2018, 01/30/2019, 03/26/2020   PFIZER(Purple Top)SARS-COV-2 Vaccination 07/24/2019, 08/25/2019   PNEUMOCOCCAL CONJUGATE-20 03/31/2021   Pneumococcal Polysaccharide-23 04/03/2014, 07/12/2020   Respiratory Syncytial Virus Vaccine,Recomb Aduvanted(Arexvy) 03/24/2022   Td 06/15/2008   Tdap 04/07/2017, 03/29/2022   Zoster Recombinat (Shingrix) 10/01/2020, 02/21/2021   Zoster, Live 12/17/2010    TDAP status: Up to date  Flu Vaccine status: Up to date  Pneumococcal vaccine status: Up to date  Covid-19 vaccine status: Completed vaccines  Qualifies for Shingles Vaccine? Yes   Zostavax completed No   Shingrix Completed?: Yes  Screening Tests Health Maintenance  Topic Date Due   Diabetic kidney evaluation - Urine ACR  Never done   Diabetic kidney evaluation - eGFR measurement  02/23/2023   Medicare Annual Wellness (AWV)  04/01/2023   COLONOSCOPY (Pts 45-415yrInsurance coverage will need to be confirmed)  02/17/2027   DTaP/Tdap/Td (4 - Td or Tdap) 03/29/2032   Pneumonia Vaccine 6520Years old  Completed   INFLUENZA VACCINE  Completed   Hepatitis C Screening  Completed   Zoster Vaccines- Shingrix  Completed    HPV VACCINES  Aged Out   COVID-19 Vaccine  Discontinued    Health Maintenance  Health Maintenance Due  Topic Date Due   Diabetic kidney evaluation - Urine ACR  Never done    Colorectal cancer screening: Type of screening: Colonoscopy. Completed 02/16/22. Repeat every 5 years  Lung Cancer Screening: (Low Dose CT Chest recommended if Age 67-80ears, 30 pack-year currently smoking OR have quit w/in 15years.) does not qualify.   Lung Cancer Screening Referral: n/a  Additional Screening:  Hepatitis C Screening: does qualify; Completed 05/08/18  Vision Screening: Recommended annual ophthalmology exams for early detection of glaucoma and other disorders of the eye. Is the patient up to date with  their annual eye exam?  Yes  Who is the provider or what is the name of the office in which the patient attends annual eye exams? [facility]  If pt is not established with a provider, would they like to be referred to a provider to establish care? No .   Dental Screening: Recommended annual dental exams for proper oral hygiene  Community Resource Referral / Chronic Care Management: CRR required this visit?  No   CCM required this visit?  No      Plan:     I have personally reviewed and noted the following in the patient's chart:   Medical and social history Use of alcohol, tobacco or illicit drugs  Current medications and supplements including opioid prescriptions. Patient is currently taking opioid prescriptions. Information provided to patient regarding non-opioid alternatives. Patient advised to discuss non-opioid treatment plan with their provider. Functional ability and status Nutritional status Physical activity Advanced directives List of other physicians Hospitalizations, surgeries, and ER visits in previous 12 months Vitals Screenings to include cognitive, depression, and falls Referrals and appointments  In addition, I have reviewed and discussed with patient  certain preventive protocols, quality metrics, and best practice recommendations. A written personalized care plan for preventive services as well as general preventive health recommendations were provided to patient.     Vanetta Mulders, LPN   [id number]   Due to this being a virtual visit, the after visit summary with patients personalized plan was offered to patient via mail or my-chart. Patient would like to access on my-chart  Nurse Notes: No concerns

## 2022-03-31 NOTE — Patient Instructions (Signed)
[name] , Thank you for taking time to come for your Medicare Wellness Visit. I appreciate your ongoing commitment to your health goals. Please review the following plan we discussed and let me know if I can assist you in the future.   These are the goals we discussed:  Goals      Have 3 meals a day     Pt would like to lose weight. Pt would like to move to [place].      Increase physical activity        This is a list of the screening recommended for you and due dates:  Health Maintenance  Topic Date Due   Yearly kidney health urinalysis for diabetes  Never done   Yearly kidney function blood test for diabetes  02/23/2023   Medicare Annual Wellness Visit  04/01/2023   Colon Cancer Screening  02/17/2027   DTaP/Tdap/Td vaccine (4 - Td or Tdap) 03/29/2032   Pneumonia Vaccine  Completed   Flu Shot  Completed   Hepatitis C Screening: USPSTF Recommendation to screen - Ages 18-79 yo.  Completed   Zoster (Shingles) Vaccine  Completed   HPV Vaccine  Aged Out   COVID-19 Vaccine  Discontinued    Advanced directives: Please bring a copy of your health care power of attorney and living will to the office to be added to your chart at your convenience.   Conditions/risks identified: Aim for 30 minutes of exercise or brisk walking, 6-8 glasses of water, and 5 servings of fruits and vegetables each day.   Next appointment: Follow up in one year for your annual wellness visit.   Preventive Care 67 Years and Older, Male  Preventive care refers to lifestyle choices and visits with your health care provider that can promote health and wellness. What does preventive care include? A yearly physical exam. This is also called an annual well check. Dental exams once or twice a year. Routine eye exams. Ask your health care provider how often you should have your eyes checked. Personal lifestyle choices, including: Daily care of your teeth and gums. Regular physical activity. Eating a healthy  diet. Avoiding tobacco and drug use. Limiting alcohol use. Practicing safe sex. Taking low doses of aspirin every day. Taking vitamin and mineral supplements as recommended by your health care provider. What happens during an annual well check? The services and screenings done by your health care provider during your annual well check will depend on your age, overall health, lifestyle risk factors, and family history of disease. Counseling  Your health care provider may ask you questions about your: Alcohol use. Tobacco use. Drug use. Emotional well-being. Home and relationship well-being. Sexual activity. Eating habits. History of falls. Memory and ability to understand (cognition). Work and work Statistician. Screening  You may have the following tests or measurements: Height, weight, and BMI. Blood pressure. Lipid and cholesterol levels. These may be checked every 5 years, or more frequently if you are over 85 years old. Skin check. Lung cancer screening. You may have this screening every year starting at age 55 if you have a 30-pack-year history of smoking and currently smoke or have quit within the past 15 years. Fecal occult blood test (FOBT) of the stool. You may have this test every year starting at age 32. Flexible sigmoidoscopy or colonoscopy. You may have a sigmoidoscopy every 5 years or a colonoscopy every 10 years starting at age 74. Prostate cancer screening. Recommendations will vary depending on your family history  and other risks. Hepatitis C blood test. Hepatitis B blood test. Sexually transmitted disease (STD) testing. Diabetes screening. This is done by checking your blood sugar (glucose) after you have not eaten for a while (fasting). You may have this done every 1-3 years. Abdominal aortic aneurysm (AAA) screening. You may need this if you are a current or former smoker. Osteoporosis. You may be screened starting at age 48 if you are at high risk. Talk with  your health care provider about your test results, treatment options, and if necessary, the need for more tests. Vaccines  Your health care provider may recommend certain vaccines, such as: Influenza vaccine. This is recommended every year. Tetanus, diphtheria, and acellular pertussis (Tdap, Td) vaccine. You may need a Td booster every 10 years. Zoster vaccine. You may need this after age 28. Pneumococcal 13-valent conjugate (PCV13) vaccine. One dose is recommended after age 67. Pneumococcal polysaccharide (PPSV23) vaccine. One dose is recommended after age 67. Talk to your health care provider about which screenings and vaccines you need and how often you need them. This information is not intended to replace advice given to you by your health care provider. Make sure you discuss any questions you have with your health care provider. Document Released: 04/30/2015 Document Revised: 12/22/2015 Document Reviewed: 02/02/2015 Elsevier Interactive Patient Education  2017 [place] Prevention in the Home Falls can cause injuries. They can happen to people of all ages. There are many things you can do to make your home safe and to help prevent falls. What can I do on the outside of my home? Regularly fix the edges of walkways and driveways and fix any cracks. Remove anything that might make you trip as you walk through a door, such as a raised step or threshold. Trim any bushes or trees on the path to your home. Use bright outdoor lighting. Clear any walking paths of anything that might make someone trip, such as rocks or tools. Regularly check to see if handrails are loose or broken. Make sure that both sides of any steps have handrails. Any raised decks and porches should have guardrails on the edges. Have any leaves, snow, or ice cleared regularly. Use sand or salt on walking paths during winter. Clean up any spills in your garage right away. This includes oil or grease spills. What  can I do in the bathroom? Use night lights. Install grab bars by the toilet and in the tub and shower. Do not use towel bars as grab bars. Use non-skid mats or decals in the tub or shower. If you need to sit down in the shower, use a plastic, non-slip stool. Keep the floor dry. Clean up any water that spills on the floor as soon as it happens. Remove soap buildup in the tub or shower regularly. Attach bath mats securely with double-sided non-slip rug tape. Do not have throw rugs and other things on the floor that can make you trip. What can I do in the bedroom? Use night lights. Make sure that you have a light by your bed that is easy to reach. Do not use any sheets or blankets that are too big for your bed. They should not hang down onto the floor. Have a firm chair that has side arms. You can use this for support while you get dressed. Do not have throw rugs and other things on the floor that can make you trip. What can I do in the kitchen? Clean up any spills  right away. Avoid walking on wet floors. Keep items that you use a lot in easy-to-reach places. If you need to reach something above you, use a strong step stool that has a grab bar. Keep electrical cords out of the way. Do not use floor polish or wax that makes floors slippery. If you must use wax, use non-skid floor wax. Do not have throw rugs and other things on the floor that can make you trip. What can I do with my stairs? Do not leave any items on the stairs. Make sure that there are handrails on both sides of the stairs and use them. Fix handrails that are broken or loose. Make sure that handrails are as long as the stairways. Check any carpeting to make sure that it is firmly attached to the stairs. Fix any carpet that is loose or worn. Avoid having throw rugs at the top or bottom of the stairs. If you do have throw rugs, attach them to the floor with carpet tape. Make sure that you have a light switch at the top of the  stairs and the bottom of the stairs. If you do not have them, ask someone to add them for you. What else can I do to help prevent falls? Wear shoes that: Do not have high heels. Have rubber bottoms. Are comfortable and fit you well. Are closed at the toe. Do not wear sandals. If you use a stepladder: Make sure that it is fully opened. Do not climb a closed stepladder. Make sure that both sides of the stepladder are locked into place. Ask someone to hold it for you, if possible. Clearly mark and make sure that you can see: Any grab bars or handrails. First and last steps. Where the edge of each step is. Use tools that help you move around (mobility aids) if they are needed. These include: Canes. Walkers. Scooters. Crutches. Turn on the lights when you go into a dark area. Replace any light bulbs as soon as they burn out. Set up your furniture so you have a clear path. Avoid moving your furniture around. If any of your floors are uneven, fix them. If there are any pets around you, be aware of where they are. Review your medicines with your doctor. Some medicines can make you feel dizzy. This can increase your chance of falling. Ask your doctor what other things that you can do to help prevent falls. This information is not intended to replace advice given to you by your health care provider. Make sure you discuss any questions you have with your health care provider. Document Released: 01/28/2009 Document Revised: 09/09/2015 Document Reviewed: 05/08/2014 Elsevier Interactive Patient Education  2017 [employer].

## 2022-04-06 ENCOUNTER — Ambulatory Visit: Payer: Medicare Other | Attending: Cardiovascular Disease

## 2022-04-06 DIAGNOSIS — I509 Heart failure, unspecified: Secondary | ICD-10-CM | POA: Diagnosis not present

## 2022-04-07 LAB — ECHOCARDIOGRAM COMPLETE
AR max vel: 1.85 cm2
AV Area VTI: 1.68 cm2
AV Area mean vel: 1.66 cm2
AV Mean grad: 9.5 mmHg
AV Peak grad: 13.8 mmHg
Ao pk vel: 1.86 m/s
Area-P 1/2: 4.17 cm2
Calc EF: 59.8 %
MV M vel: 2.45 m/s
MV Peak grad: 23.9 mmHg
Single Plane A2C EF: 60 %
Single Plane A4C EF: 59.7 %

## 2022-04-14 ENCOUNTER — Ambulatory Visit (INDEPENDENT_AMBULATORY_CARE_PROVIDER_SITE_OTHER): Payer: Medicare Other | Admitting: Family Medicine

## 2022-04-14 VITALS — BP 102/73 | Temp 98.5°F | Ht 71.0 in | Wt 221.6 lb

## 2022-04-14 DIAGNOSIS — R051 Acute cough: Secondary | ICD-10-CM

## 2022-04-14 DIAGNOSIS — J209 Acute bronchitis, unspecified: Secondary | ICD-10-CM | POA: Diagnosis not present

## 2022-04-14 MED ORDER — PROMETHAZINE-DM 6.25-15 MG/5ML PO SYRP: 5.0000 mL | ORAL_SOLUTION | Freq: Four times a day (QID) | ORAL | 0 refills | Status: DC | PRN

## 2022-04-14 MED ORDER — AMOXICILLIN-POT CLAVULANATE 875-125 MG PO TABS: 1.0000 | ORAL_TABLET | Freq: Two times a day (BID) | ORAL | 0 refills | Status: DC

## 2022-04-14 MED ORDER — ALBUTEROL SULFATE HFA 108 (90 BASE) MCG/ACT IN AERS: 2.0000 | INHALATION_SPRAY | Freq: Four times a day (QID) | RESPIRATORY_TRACT | 0 refills | Status: DC | PRN

## 2022-04-14 NOTE — Patient Instructions (Signed)
Medications as directed.  Awaiting test results.  Take care  Dr. Lacinda Axon

## 2022-04-17 DIAGNOSIS — J209 Acute bronchitis, unspecified: Secondary | ICD-10-CM | POA: Insufficient documentation

## 2022-04-17 LAB — COVID-19, FLU A+B AND RSV
Influenza A, NAA: NOT DETECTED
Influenza B, NAA: NOT DETECTED
RSV, NAA: NOT DETECTED
SARS-CoV-2, NAA: NOT DETECTED

## 2022-04-17 LAB — SPECIMEN STATUS REPORT

## 2022-04-17 NOTE — Assessment & Plan Note (Signed)
COVID/Flu/RSV testing negative. Treating with Augmentin, Albuterol, and Promethazine DM.

## 2022-04-17 NOTE — Progress Notes (Signed)
Subjective:  Patient ID: [name], male    DOB: Nov 27, 1955  Age: 67 y.o. MRN: [id number]  CC: Chief Complaint  Patient presents with   Cough    Congestion for several days    HPI:  67 year old male with multiple medical problems presents for evaluation of the above.   Symptoms over the past several days. Reports productive cough, congestion, body aches, and wheezing. No fever. No relieving factors. No other associated symptoms. No other complaints.  Patient Active Problem List   Diagnosis Date Noted   Acute bronchitis 04/17/2022   Upper airway cough syndrome 12/14/2020   Aortic atherosclerosis ([place]) 05/27/2020   Stable angina pectoris    Hyperlipidemia 01/04/2016   Acquired complete AV block ([place]) 04/02/2014   OSA (obstructive sleep apnea) 10/16/2013   BPH (benign prostatic hyperplasia) 08/25/2013   Coronary artery disease involving native coronary artery of native heart with angina pectoris ([place]) 10/18/2012   S/P drug eluting coronary stent placement 10/18/2012   DOE (dyspnea on exertion) 12/26/2011   Hypertension    GERD (gastroesophageal reflux disease) 12/20/2011   Hx of adenomatous colonic polyps 12/20/2011    Social Hx   Social History   Socioeconomic History   Marital status: Married    Spouse name: Not on file   Number of children: 3   Years of education: Not on file   Highest education level: Not on file  Occupational History   Not on file  Tobacco Use   Smoking status: Former    Packs/day: 2.50    Years: 40.00    Total pack years: 100.00    Types: Cigarettes    Start date: 04/18/1967    Quit date: 12/20/2003    Years since quitting: 18.3   Smokeless tobacco: Former  Scientific laboratory technician Use: Former  Substance and Sexual Activity   Alcohol use: Yes    Alcohol/week: 7.0 - 14.0 standard drinks of alcohol    Types: 7 - 14 Shots of liquor per week    Comment: rare   Drug use: Not Currently    Types: Marijuana    Comment: 30 + years ago -  "crank, marjiuanna, ect."   Sexual activity: Yes    Birth control/protection: Surgical  Other Topics Concern   Not on file  Social History Narrative   Retired Therapist, art.   Social Determinants of Health   Financial Resource Strain: Low Risk  (03/31/2022)   Overall Financial Resource Strain (CARDIA)    Difficulty of Paying Living Expenses: Not hard at all  Food Insecurity: No Food Insecurity (03/31/2022)   Hunger Vital Sign    Worried About Running Out of Food in the Last Year: Never true    Ran Out of Food in the Last Year: Never true  Transportation Needs: No Transportation Needs (03/31/2022)   PRAPARE - Hydrologist (Medical): No    Lack of Transportation (Non-Medical): No  Physical Activity: Insufficiently Active (03/31/2022)   Exercise Vital Sign    Days of Exercise per Week: 2 days    Minutes of Exercise per Session: 30 min  Stress: No Stress Concern Present (03/31/2022)   [place]    Feeling of Stress : Not at all  Social Connections: Moderately Isolated (03/31/2022)   Social Connection and Isolation Panel [NHANES]    Frequency of Communication with Friends and Family: More than three times a week    Frequency  of Social Gatherings with Friends and Family: Once a week    Attends Religious Services: Never    Printmaker: No    Attends Music therapist: Never    Marital Status: Married    Review of Systems Per HPI  Objective:  BP 102/73   Temp 98.5 F (36.9 C) (Oral)   Ht '5\' 11"'$  (1.803 m)   Wt 221 lb 9.6 oz (100.5 kg)   BMI 30.91 kg/m      04/14/2022    4:18 PM 03/31/2022    1:14 PM 03/24/2022   10:42 AM  BP/Weight  Systolic BP 280  034  Diastolic BP 73  66  Wt. (Lbs) 221.6 220 222.2  BMI 30.91 kg/m2 30.68 kg/m2 30.99 kg/m2    Physical Exam Vitals and nursing note reviewed.  Constitutional:      General: He is not in  acute distress.    Appearance: Normal appearance.  HENT:     Head: Normocephalic and atraumatic.     Nose: Congestion present.  Eyes:     General:        Right eye: No discharge.        Left eye: No discharge.     Conjunctiva/sclera: Conjunctivae normal.  Cardiovascular:     Rate and Rhythm: Normal rate and regular rhythm.  Pulmonary:     Effort: Pulmonary effort is normal.     Breath sounds: Wheezing present.  Neurological:     Mental Status: He is alert.     Lab Results  Component Value Date   WBC 5.8 02/22/2022   HGB 15.1 02/22/2022   HCT 44.5 02/22/2022   PLT 188 02/22/2022   GLUCOSE 145 (H) 02/22/2022   CHOL 132 03/09/2021   TRIG 73 03/09/2021   HDL 58 03/09/2021   LDLCALC 59 03/09/2021   ALT 18 02/22/2022   AST 14 (L) 02/22/2022   NA 141 02/22/2022   K 4.0 02/22/2022   CL 103 02/22/2022   CREATININE 1.02 02/22/2022   BUN 16 02/22/2022   CO2 28 02/22/2022   TSH 1.730 12/14/2020   PSA 1.86 06/23/2020   INR 1.0 02/22/2022   HGBA1C 6.2 (H) 11/18/2021     Assessment & Plan:   Problem List Items Addressed This Visit       Respiratory   Acute bronchitis - Primary    COVID/Flu/RSV testing negative. Treating with Augmentin, Albuterol, and Promethazine DM.      Other Visit Diagnoses     Acute cough       Relevant Orders   COVID-19, Flu A+B and RSV (Completed)       Meds ordered this encounter  Medications   amoxicillin-clavulanate (AUGMENTIN) 875-125 MG tablet    Sig: Take 1 tablet by mouth 2 (two) times daily.    Dispense:  14 tablet    Refill:  0   albuterol (VENTOLIN HFA) 108 (90 Base) MCG/ACT inhaler    Sig: Inhale 2 puffs into the lungs every 6 (six) hours as needed for wheezing or shortness of breath.    Dispense:  8 g    Refill:  0   promethazine-dextromethorphan (PROMETHAZINE-DM) 6.25-15 MG/5ML syrup    Sig: Take 5 mLs by mouth 4 (four) times daily as needed for cough.    Dispense:  118 mL    Refill:  0    Follow-up:  Return if  symptoms worsen or fail to improve.  [place]

## 2022-04-20 ENCOUNTER — Ambulatory Visit (INDEPENDENT_AMBULATORY_CARE_PROVIDER_SITE_OTHER): Payer: Medicare Other

## 2022-04-20 DIAGNOSIS — I442 Atrioventricular block, complete: Secondary | ICD-10-CM

## 2022-04-21 LAB — CUP PACEART REMOTE DEVICE CHECK
Battery Remaining Longevity: 20 mo
Battery Voltage: 2.9 V
Brady Statistic AP VP Percent: 51.35 %
Brady Statistic AP VS Percent: 0.59 %
Brady Statistic AS VP Percent: 44.94 %
Brady Statistic AS VS Percent: 3.12 %
Brady Statistic RA Percent Paced: 47.16 %
Brady Statistic RV Percent Paced: 89.11 %
Date Time Interrogation Session: 20240104173104
Implantable Lead Connection Status: 753985
Implantable Lead Connection Status: 753985
Implantable Lead Connection Status: 753985
Implantable Lead Implant Date: 20151217
Implantable Lead Implant Date: 20151217
Implantable Lead Implant Date: 20151217
Implantable Lead Location: 753858
Implantable Lead Location: 753859
Implantable Lead Location: 753860
Implantable Lead Model: 4196
Implantable Lead Model: 5076
Implantable Lead Model: 5076
Implantable Pulse Generator Implant Date: 20151217
Lead Channel Impedance Value: 399 Ohm
Lead Channel Impedance Value: 399 Ohm
Lead Channel Impedance Value: 418 Ohm
Lead Channel Impedance Value: 437 Ohm
Lead Channel Impedance Value: 589 Ohm
Lead Channel Impedance Value: 608 Ohm
Lead Channel Impedance Value: 665 Ohm
Lead Channel Impedance Value: 684 Ohm
Lead Channel Impedance Value: 988 Ohm
Lead Channel Pacing Threshold Amplitude: 0.75 V
Lead Channel Pacing Threshold Amplitude: 0.75 V
Lead Channel Pacing Threshold Amplitude: 1 V
Lead Channel Pacing Threshold Pulse Width: 0.4 ms
Lead Channel Pacing Threshold Pulse Width: 0.4 ms
Lead Channel Pacing Threshold Pulse Width: 0.4 ms
Lead Channel Sensing Intrinsic Amplitude: 1 mV
Lead Channel Sensing Intrinsic Amplitude: 1 mV
Lead Channel Sensing Intrinsic Amplitude: 18.875 mV
Lead Channel Sensing Intrinsic Amplitude: 18.875 mV
Lead Channel Setting Pacing Amplitude: 2 V
Lead Channel Setting Pacing Amplitude: 2.25 V
Lead Channel Setting Pacing Amplitude: 2.5 V
Lead Channel Setting Pacing Pulse Width: 0.4 ms
Lead Channel Setting Pacing Pulse Width: 0.4 ms
Lead Channel Setting Sensing Sensitivity: 0.9 mV
Zone Setting Status: 755011
Zone Setting Status: 755011

## 2022-04-22 ENCOUNTER — Other Ambulatory Visit: Payer: Self-pay | Admitting: Family Medicine

## 2022-04-22 ENCOUNTER — Other Ambulatory Visit: Payer: Self-pay | Admitting: Cardiology

## 2022-04-27 DIAGNOSIS — D487 Neoplasm of uncertain behavior of other specified sites: Secondary | ICD-10-CM | POA: Diagnosis not present

## 2022-04-27 DIAGNOSIS — D485 Neoplasm of uncertain behavior of skin: Secondary | ICD-10-CM | POA: Diagnosis not present

## 2022-05-02 ENCOUNTER — Other Ambulatory Visit: Payer: Self-pay | Admitting: Family Medicine

## 2022-05-05 ENCOUNTER — Encounter: Payer: Self-pay | Admitting: Cardiovascular Disease

## 2022-05-05 ENCOUNTER — Ambulatory Visit: Payer: Medicare Other | Attending: Cardiovascular Disease | Admitting: Cardiovascular Disease

## 2022-05-05 VITALS — BP 178/82 | HR 71 | Ht 71.0 in | Wt 229.0 lb

## 2022-05-05 DIAGNOSIS — I493 Ventricular premature depolarization: Secondary | ICD-10-CM | POA: Diagnosis not present

## 2022-05-05 DIAGNOSIS — R5383 Other fatigue: Secondary | ICD-10-CM

## 2022-05-05 NOTE — Progress Notes (Signed)
PCP: Kathyrn Drown, MD Primary Cardiologist: Dr Harl Bowie Primary EP:  Dr Mardee Postin [name] is a 67 y.o. male who presents today for routine electrophysiology followup.  Since last being seen in our clinic, the patient reports doing reasonably well.   He presents today for follow-up after TTE. His EF is normal. His PVCs have improves significantly, almost resolved, after discontinuing ranolazine. He still has fatigue, which he attributes to being overweight, inactive, a milieu of pain medication.   Today, he denies symptoms of palpitations, chest pain, shortness of breath, lower extremity edema, dizziness, presyncope, or syncope.  He has noticed increased palpitations and fatigue for several months, possibly coinciding with his heart cath. The patient is otherwise without complaint today.   Past Medical History:  Diagnosis Date   ADD (attention deficit disorder)    Rx-Adderall   Anginal pain ([place])    Anxiety    on meds   Aortic atherosclerosis ([place]) 05/27/2020   Seen on x-ray being treated with statin   AV block, 3rd degree ([place])    Colon polyps 2011   tubular adenoma by excisional biopsy during colonoscopy   Complete heart block ([place])    a. s/p PPM Placement in 12/2012 with Medtronic CRT-P placement in 03/2014   Coronary artery disease    a. s/p PCI of the RCA in 2014 b. 08/2018: patent RCA stent with new mid-LAD 85% stenosis (treated with PCI/DES) and 85% distal OM2 stenosis (med management recommended)   Degenerative disc disease, cervical    Depression    PTSD   DM (diabetes mellitus) ([place])    on meds   Gastrointestinal bleeding 10/15/2017   GERD (gastroesophageal reflux disease)    Hyperlipidemia    on meds   Hypertension    on meds   Pacemaker    Palpitations 2003   [place] in 2003: PACs and PVCs; negative stress nuclear test in 2005; right bundle branch block; echo in 2008-mild LVH, otherwise normal; 2008-negative stress nuclear test.   Pneumonia     Prostatitis    prostate calcifications by CT   Seasonal allergies    Sleep apnea    questionable diagnosis   Past Surgical History:  Procedure Laterality Date   ANTERIOR CERVICAL DECOMP/DISCECTOMY FUSION     BACK SURGERY     Biventricular pacemaker upgrade/ system revision  04/02/2014   removal of previous atrial and ventricular leads with placement of a new MDT Consulta CRT-P system at [facility] by Dr Violet Baldy   COLONOSCOPY  2017   MS-MAC-suprep(good)-leiomyoma/TA   COLONOSCOPY W/ POLYPECTOMY  2011   tubular adenoma   CORONARY BALLOON ANGIOPLASTY N/A 06/15/2020   Procedure: CORONARY BALLOON ANGIOPLASTY;  Surgeon: Jettie Booze, MD;  Location: [place] [facility];  Service: Cardiovascular;  Laterality: N/A;   CORONARY STENT INTERVENTION N/A 08/22/2018   Procedure: CORONARY STENT INTERVENTION;  Surgeon: Troy Sine, MD;  Location: [place] [facility];  Service: Cardiovascular;  Laterality: N/A;   ESOPHAGOGASTRODUODENOSCOPY     Gastritis   INSERT / REPLACE / REMOVE PACEMAKER  12/17/2012   MDT Adapta L implanted by Dr Rayann Heman for mobitz II second degree AV block   INTRAVASCULAR ULTRASOUND/IVUS N/A 06/15/2020   Procedure: Intravascular Ultrasound/IVUS;  Surgeon: Jettie Booze, MD;  Location: [place] [facility];  Service: Cardiovascular;  Laterality: N/A;   KNEE SURGERY Right    LEFT HEART CATH AND CORONARY ANGIOGRAPHY N/A 08/22/2018   Procedure: LEFT HEART CATH AND CORONARY ANGIOGRAPHY;  Surgeon: Troy Sine, MD;  Location: [place] [facility];  Service: Cardiovascular;  Laterality: N/A;   LEFT HEART CATH AND CORONARY ANGIOGRAPHY N/A 06/15/2020   Procedure: LEFT HEART CATH AND CORONARY ANGIOGRAPHY;  Surgeon: Jettie Booze, MD;  Location: [place] [facility];  Service: Cardiovascular;  Laterality: N/A;   LEFT HEART CATH AND CORONARY ANGIOGRAPHY N/A 09/01/2021   Procedure: LEFT HEART CATH AND CORONARY ANGIOGRAPHY;  Surgeon: Troy Sine, MD;   Location: [place] [facility];  Service: Cardiovascular;  Laterality: N/A;   LEFT HEART CATHETERIZATION WITH CORONARY ANGIOGRAM N/A 10/17/2012   Procedure: LEFT HEART CATHETERIZATION WITH CORONARY ANGIOGRAM;  Surgeon: Josue Hector, MD;  Location: [facility] CATH LAB;  Service: Cardiovascular;  Laterality: N/A;   LEFT HEART CATHETERIZATION WITH CORONARY ANGIOGRAM N/A 12/17/2012   Procedure: LEFT HEART CATHETERIZATION WITH CORONARY ANGIOGRAM;  Surgeon: Peter M Martinique, MD;  Location: [facility] CATH LAB;  Service: Cardiovascular;  Laterality: N/A;   LEFT HEART CATHETERIZATION WITH CORONARY ANGIOGRAM N/A 06/11/2013   Procedure: LEFT HEART CATHETERIZATION WITH CORONARY ANGIOGRAM;  Surgeon: Wellington Hampshire, MD;  Location: [place] CATH LAB;  Service: Cardiovascular;  Laterality: N/A;   PERCUTANEOUS CORONARY STENT INTERVENTION (PCI-S)  10/17/2012   Procedure: PERCUTANEOUS CORONARY STENT INTERVENTION (PCI-S);  Surgeon: Josue Hector, MD;  [facility] C[facility] LAB;  Service: Cardiovascular;;   PERMANENT PACEMAKER INSERTION N/A 12/17/2012   Procedure: PERMANENT PACEMAKER INSERTION;  Surgeon: Thompson Grayer, MD;  Location[facility] CATH LAB;  Service: Cardiovascular;  Laterality: N/A;   POLYPECTOMY  2017   TA   UPPER GASTROINTESTINAL ENDOSCOPY      ROS- all systems are reviewed and negative except as per HPI above  Current Outpatient Medications  Medication Sig Dispense Refill   acetaminophen (TYLENOL) 500 MG tablet Take 1,500 mg by mouth daily as needed for moderate pain.     albuterol (VENTOLIN HFA) 108 (90 Base) MCG/ACT inhaler Inhale 2 puffs into the lungs every 6 (six) hours as needed for wheezing or shortness of breath. 8 g 0   ALPRAZolam (XANAX) 0.5 MG tablet 1/2-1 twice daily as needed home use only use sparingly 20 tablet 0   amLODipine (NORVASC) 5 MG tablet Take 1 tablet (5 mg total) by mouth daily. 90 tablet 1   amoxicillin-clavulanate (AUGMENTIN) 875-125 MG tablet Take 1 tablet by mouth 2 (two) times daily. 14 tablet 0    aspirin EC 81 MG tablet Take 81 mg by mouth daily. Swallow whole.     HYDROmorphone (DILAUDID) 4 MG tablet Take 1 tablet (4 mg total) by mouth every 6 (six) hours as needed for severe pain. 20 tablet 0   isosorbide mononitrate (IMDUR) 120 MG 24 hr tablet TAKE 1 TABLET(120 MG) BY MOUTH DAILY 90 tablet 1   losartan (COZAAR) 100 MG tablet TAKE 1 TABLET(100 MG) BY MOUTH DAILY 90 tablet 0   metFORMIN (GLUCOPHAGE) 500 MG tablet TAKE 1 TABLET BY MOUTH TWICE DAILY 60 tablet 3   nadolol (CORGARD) 80 MG tablet TAKE 1 TABLET(80 MG) BY MOUTH DAILY 90 tablet 1   nitroGLYCERIN (NITROSTAT) 0.4 MG SL tablet Place 1 tablet (0.4 mg total) under the tongue every 5 (five) minutes as needed for chest pain (MAX 3 TABLETS). 25 tablet 3   ondansetron (ZOFRAN) 8 MG tablet Take 1 tablet (8 mg total) by mouth every 8 (eight) hours as needed for nausea. 15 tablet 3   promethazine-dextromethorphan (PROMETHAZINE-DM) 6.25-15 MG/5ML syrup Take 5 mLs by mouth 4 (four) times daily as needed  for cough. 118 mL 0   rosuvastatin (CRESTOR) 10 MG tablet Take 1 tablet (10 mg total) by mouth daily. 30 tablet 6   tamsulosin (FLOMAX) 0.4 MG CAPS capsule TAKE 1 CAPSULE(0.4 MG) BY MOUTH DAILY 90 capsule 0   Tetrahydrozoline HCl (VISINE OP) Place 1 drop into both eyes daily as needed (allergies).     Current Facility-Administered Medications  Medication Dose Route Frequency Provider Last Rate Last Admin   sodium chloride flush (NS) 0.9 % injection 3 mL  3 mL Intravenous Q12H Branch, Alphonse Guild, MD        Physical Exam: There were no vitals filed for this visit.   GEN- The patient is well appearing, alert and oriented x 3 today.   Lungs- Clear to ausculation bilaterally, normal work of breathing Chest- pacemaker pocket is well healed Heart- Regular rate and rhythm, no murmurs, rubs or gallops Extremities- no clubbing, cyanosis, or edema  Pacemaker interrogation- reviewed in detail today,  See PACEART report   Assessment and  Plan:  1. Symptomatic complete heart block Normal pacemaker function  See Pace Art report No changes today he is device dependant today  2. PVCs: Occurring about 100 per hour avg. Often in bigeminy. RBBB morphology, - in inferior leads Stop ranolazine. If no improvement, will change betablocker.  3. Fatigue: EF normal, 60-65%. We discussed methods for weight loss at length. I encouraged calorie counting, consuming high quantities of vegetables and limiting fats and carbohydrates, and increasing exercise that can be sustained for a duration of 30 minutes or more.  4. CAD S/p cath 5/23 which showed nonobstructive dz Previously s/p PTCA of mid RCA  5. HTN Stable No change required today  6. HL On statin    Melida Quitter, MD  05/05/2022 3:32 PM

## 2022-05-05 NOTE — Patient Instructions (Signed)
Medication Instructions:  Continue all current medications.  Labwork: none  Testing/Procedures: none  Follow-Up: 1 year - Dr.  Mealor    Any Other Special Instructions Will Be Listed Below (If Applicable).   If you need a refill on your cardiac medications before your next appointment, please call your pharmacy.  

## 2022-05-06 ENCOUNTER — Other Ambulatory Visit: Payer: Self-pay | Admitting: Family Medicine

## 2022-05-09 ENCOUNTER — Other Ambulatory Visit: Payer: Self-pay | Admitting: Cardiology

## 2022-05-10 NOTE — Progress Notes (Signed)
Remote pacemaker transmission.   

## 2022-05-16 ENCOUNTER — Other Ambulatory Visit: Payer: Self-pay

## 2022-05-16 MED ORDER — LOSARTAN POTASSIUM 100 MG PO TABS: ORAL_TABLET | ORAL | 0 refills | Status: DC

## 2022-05-17 DIAGNOSIS — F4312 Post-traumatic stress disorder, chronic: Secondary | ICD-10-CM | POA: Diagnosis not present

## 2022-05-18 ENCOUNTER — Ambulatory Visit: Payer: Medicare Other | Attending: Cardiology | Admitting: Cardiology

## 2022-05-18 ENCOUNTER — Encounter: Payer: Self-pay | Admitting: Cardiology

## 2022-05-18 VITALS — BP 160/86 | HR 57 | Ht 71.0 in | Wt 227.4 lb

## 2022-05-18 DIAGNOSIS — R002 Palpitations: Secondary | ICD-10-CM | POA: Diagnosis not present

## 2022-05-18 DIAGNOSIS — E782 Mixed hyperlipidemia: Secondary | ICD-10-CM | POA: Diagnosis not present

## 2022-05-18 DIAGNOSIS — I10 Essential (primary) hypertension: Secondary | ICD-10-CM | POA: Diagnosis not present

## 2022-05-18 DIAGNOSIS — I25119 Atherosclerotic heart disease of native coronary artery with unspecified angina pectoris: Secondary | ICD-10-CM

## 2022-05-18 NOTE — Patient Instructions (Addendum)
Medication Instructions:  Continue all current medications.   Labwork: none  Testing/Procedures: none  Follow-Up: 6 months   Any Other Special Instructions Will Be Listed Below (If Applicable). Please call the office with update on BP readings on Monday  If you need a refill on your cardiac medications before your next appointment, please call your pharmacy.

## 2022-05-18 NOTE — Progress Notes (Signed)
Clinical Summary [name] is a 67 y.o.male seen today for a focused visit for symptoms of chest pain and palpitations.    1. CAD - history of DES to LAD in 08/2018, PCI to RCA in 2014   06/2019 echo: LVEF 60-65%, no WMAs, grade I DDx,  - 09/2019 nuclear stress: small apical infarct with mild peri-infarct ischemia   06/2020 cath: prox LAD 50%, patent LAD stent, LCX 85% distal, RCA 75% with stent ISR. Had PTCA of RCA ISR. From interventional could hold plavix after 30 days if needed       - last visit some fatigue, dizziness, SOB. We lowered his hydralazine to 33m bid and lowered imdur to 1225mdaily.      08/2021 cath: prox LAD 50%, LCX 30%, RCA patent stents. LVEDP 11 -ranexa stopped by EP due to PVC burden.    03/2022 echo: LVEF 6097-58%indet diastolic, no significant valve disease.    - nonspecific chest pains at times. Some DOE at times, sedentary lifestyle - just bought a treadmill       2.Palpitations - feeling of skipping heart beat, associated with pressure in chest and into neck. - increase in frequency recently.  - rare caffeine, rare EtOH   07/2021 minotor; frequent PACs (9.1%), rare PVCs. 6 beat run of NSVT.    - followed by Dr MeMyles GipDue to PVCs ranexa was stopped and symptoms much improved         3. HTN - has not taken meds today   3. Complete heart block - pacemaker followed by Dr AlRayann HemanCRT-P - normal device check Jan 2023   Jan 2024 normal device check.    4. Hyperlipidemia    06/2020 TC 119 HDL 52 TG 113 LDL 47  02/2021 TC 132 TG 73 HDL 58 LDL 59  - reports significant diffuse muscle aches in legs often at night.   - had tried lowering crestor to 1016maily.  - symptoms better but not resolved.    5. Orthostastic hypotension - no recent symptoms - working to stay well hydrated.     SH; just sold his house, was able to get a great price and only on market for a week. Living in a fifth wheel for the time being.  Recently bought new  home.  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 s/p PPM Placement in 12/2012 with Med[employer]onic CRT-P placement in 03/2014   Coronary artery disease    a. s/p PCI of the RCA in 2014 b. 08/2018: patent RCA stent with new mid-LAD 85% stenosis (treated with PCI/DES) and 85% distal OM2 stenosis (med management recommended)   Degenerative disc disease, cervical    Depression    PTSD   DM (diabetes mellitus) ([place]CC[place]  on meds   Gastrointestinal bleeding 10/15/2017   GERD (gastroesophageal reflux disease)    Hyperlipidemia    on meds   Hypertension    on meds   Pacemaker    Palpitations 2003   Holter in 2003: PACs and  PVCs; negative stress nuclear test in 2005; right bundle [name] block; echo in 2008-mild LVH, otherwise normal; 2008-negative stress nuclear test.   Pneumonia    Prostatitis    prostate calcifications by CT   Seasonal allergies    Sleep apnea    questionable diagnosis     Allergies  Allergen Reactions   Morphine And Related Itching and Rash    redness   Lasix [Furosemide]     Chest pain and tightness   Latex Rash     Current Outpatient Medications  Medication Sig Dispense Refill   albuterol (VENTOLIN HFA) 108 (90 Base) MCG/ACT inhaler TAKE 2 PUFFS BY MOUTH EVERY 6 HOURS AS NEEDED FOR WHEEZE OR SHORTNESS OF BREATH 18 each 1   acetaminophen (TYLENOL) 500 MG tablet Take 1,500 mg by mouth daily as needed for moderate pain.     ALPRAZolam (XANAX) 0.5 MG tablet 1/2-1 twice daily as needed home use only use sparingly 20 tablet 0   amLODipine (NORVASC) 5 MG tablet Take 1 tablet (5 mg total) by mouth daily. 90 tablet 1    amoxicillin-clavulanate (AUGMENTIN) 875-125 MG tablet Take 1 tablet by mouth 2 (two) times daily. 14 tablet 0   aspirin EC 81 MG tablet Take 81 mg by mouth daily. Swallow whole.     HYDROmorphone (DILAUDID) 4 MG tablet Take 1 tablet (4 mg total) by mouth every 6 (six) hours as needed for severe pain. 20 tablet 0   isosorbide mononitrate (IMDUR) 120 MG 24 hr tablet TAKE 1 TABLET(120 MG) BY MOUTH DAILY 90 tablet 1   losartan (COZAAR) 100 MG tablet TAKE 1 TABLET(100 MG) BY MOUTH DAILY 90 tablet 0   metFORMIN (GLUCOPHAGE) 500 MG tablet TAKE 1 TABLET BY MOUTH TWICE DAILY 60 tablet 3   nadolol (CORGARD) 80 MG tablet TAKE 1 TABLET(80 MG) BY MOUTH DAILY 90 tablet 1   nitroGLYCERIN (NITROSTAT) 0.4 MG SL tablet Place 1 tablet (0.4 mg total) under the tongue every 5 (five) minutes as needed for chest pain (MAX 3 TABLETS). 25 tablet 3   ondansetron (ZOFRAN) 8 MG tablet Take 1 tablet (8 mg total) by mouth every 8 (eight) hours as needed for nausea. 15 tablet 3   promethazine-dextromethorphan (PROMETHAZINE-DM) 6.25-15 MG/5ML syrup Take 5 mLs by mouth 4 (four) times daily as needed for cough. 118 mL 0   rosuvastatin (CRESTOR) 10 MG tablet TAKE 1 TABLET (10MG) BY MOUTH DAILY 30 tablet 6   tamsulosin (FLOMAX) 0.4 MG CAPS capsule TAKE 1 CAPSULE(0.4 MG) BY MOUTH DAILY 90 capsule 0   Tetrahydrozoline HCl (VISINE OP) Place 1 drop into both eyes daily as needed (allergies).     Current Facility-Administered Medications  Medication Dose Route Frequency Provider Last Rate Last Admin   sodium chloride flush (NS) 0.9 % injection 3 mL  3 mL Intravenous Q12H Doyle Kunath, Alphonse Guild, MD         Past Surgical History:  Procedure Laterality Date   ANTERIOR CERVICAL DECOMP/DISCECTOMY FUSION     BACK SURGERY     Biventricular pacemaker upgrade/ system revision  04/02/2014   removal of previous atrial and ventricular leads with placement of a new MDT Consulta CRT-P system at [facility] by Dr [facility]    COLONOSCOPY  2017   MS-MAC-suprep(good)-leiomyoma/TA   COLONOSCOPY W/ POLYPECTOMY  2011   tubular adenoma   CORONARY BALLOON ANGIOPLASTY N/A 06/15/2020   Procedure: [place];  Surgeon: Jettie Booze, MD;  Location: [place] [facility];  Service: Cardiovascular;  Laterality: N/A;   CORONARY STENT INTERVENTION N/A 08/22/2018   Procedure: CORONARY STENT INTERVENTION;  Surgeon: Troy Sine, MD;  Location: [place] [facility];  Service: Cardiovascular;  Laterality: N/A;   ESOPHAGOGASTRODUODENOSCOPY     Gastritis   INSERT / REPLACE / REMOVE PACEMAKER  12/17/2012   MDT Adapta L implanted by Dr Rayann Heman for mobitz II second degree AV block   INTRAVASCULAR ULTRASOUND/IVUS N/A 06/15/2020   Procedure: Intravascular Ultrasound/IVUS;  Surgeon: Jettie Booze, MD;  Location: [place] [facility];  Service: Cardiovascular;  Laterality: N/A;   KNEE SURGERY Right    LEFT HEART CATH AND CORONARY ANGIOGRAPHY N/A 08/22/2018   Procedure: LEFT HEART CATH AND CORONARY ANGIOGRAPHY;  Surgeon: Troy Sine, MD;  Location: [place] [facility];  Service: Cardiovascular;  Laterality: N/A;   LEFT HEART CATH AND CORONARY ANGIOGRAPHY N/A 06/15/2020   Procedure: LEFT HEART CATH AND CORONARY ANGIOGRAPHY;  Surgeon: Jettie Booze, MD;  Location: [place] [facility];  Service: Cardiovascular;  Laterality: N/A;   LEFT HEART CATH AND CORONARY ANGIOGRAPHY N/A 09/01/2021   Procedure: LEFT HEART CATH AND CORONARY ANGIOGRAPHY;  Surgeon: Troy Sine, MD;  Location: [place] [facility];  Service: Cardiovascular;  Laterality: N/A;   LEFT HEART CATHETERIZATION WITH CORONARY ANGIOGRAM N/A 10/17/2012   Procedure: LEFT HEART CATHETERIZATION WITH CORONARY ANGIOGRAM;  Surgeon: Josue Hector, MD;  Location: [facility] CATH LAB;  Service: Cardiovascular;  Laterality: N/A;   LEFT HEART CATHETERIZATION WITH CORONARY ANGIOGRAM N/A 12/17/2012   Procedure: LEFT HEART CATHETERIZATION WITH CORONARY ANGIOGRAM;   Surgeon: Peter M Martinique, MD;  Location: [facility] C[facility] LAB;  Service: Cardiovascular;  Laterality: N/A;   LEFT HEART CATHETERIZATION WITH CORONARY ANGIOGRAM N/A 06/11/2013   Procedure: LEFT HEART CATHETERIZATION WITH CORONARY ANGIOGRAM;  Surgeon: Wellington Hampshire, MD;  Location: [place] CATH LAB;  Service: Cardiovascular;  Laterality: N/A;   PERCUTANEOUS CORONARY STENT INTERVENTION (PCI-S)  10/17/2012   Procedure: PERCUTANEOUS CORONARY STENT INTERVENTION (PCI-S);  Surgeon: Josue Hector, MD;  Location: [facility] CATH LAB;  Service: Cardiovascular;;   PERMANENT PACEMAKER INSERTION N/A 12/17/2012   Procedure: PERMANENT PACEMAKER INSERTION;  Surgeon: Thompson Grayer, MD;  Location: [facility] C[facility] LAB;  Service: Cardiovascular;  Laterality: N/A;   POLYPECTOMY  2017   TA   UPPER GASTROINTESTINAL ENDOSCOPY       Allergies  Allergen Reactions   Morphine And Related Itching and Rash    redness   Lasix [Furosemide]     Chest pain and tightness   Latex Rash      Family History  Problem Relation Age of Onset   Heart disease Mother    Hypertension Mother        Carotid disease   Prostate cancer Brother 52   Breast cancer Maternal Aunt    Colon cancer Neg Hx    Colon polyps Neg Hx    Rectal cancer Neg Hx    Stomach cancer Neg Hx    Esophageal cancer Neg Hx      Social History Mr. Bolander reports that he quit smoking about 18 years ago. His smoking use included cigarettes. He started smoking about 55 years ago. He has a 100.00 pack-year smoking history. He has quit using smokeless tobacco. Mr. Thorpe reports current alcohol use of about 7.0 - 14.0 standard drinks of alcohol per week.   Review of Systems CONSTITUTIONAL: No weight loss, fever, chills, weakness or fatigue.  HEENT: Eyes: No visual loss, blurred vision, double vision or yellow sclerae.No hearing  loss, sneezing, congestion, runny nose or sore throat.  SKIN: No rash or itching.  CARDIOVASCULAR: per hpi RESPIRATORY: No shortness of breath, cough or  sputum.  GASTROINTESTINAL: No anorexia, nausea, vomiting or diarrhea. No abdominal pain or blood.  GENITOURINARY: No burning on urination, no polyuria NEUROLOGICAL: No headache, dizziness, syncope, paralysis, ataxia, numbness or tingling in the extremities. No change in bowel or bladder control.  MUSCULOSKELETAL: No muscle, back pain, joint pain or stiffness.  LYMPHATICS: No enlarged nodes. No history of splenectomy.  PSYCHIATRIC: No history of depression or anxiety.  ENDOCRINOLOGIC: No reports of sweating, cold or heat intolerance. No polyuria or polydipsia.  Marland Kitchen   Physical Examination Today's Vitals   05/18/22 1120  BP: (!) 160/86  Pulse: (!) 57  SpO2: 97%  Weight: 227 lb 6.4 oz (103.1 kg)  Height: 5' 11"  (1.803 m)   Body mass index is 31.72 kg/m.  Gen: resting comfortably, no acute distress HEENT: no scleral icterus, pupils equal round and reactive, no palptable cervical adenopathy,  CV: RRR, no mrg, no jvd Resp: Clear to auscultation bilaterally GI: abdomen is soft, non-tender, non-distended, normal bowel sounds, no hepatosplenomegaly MSK: extremities are warm, no edema.  Skin: warm, no rash Neuro:  no focal deficits Psych: appropriate affect   Diagnostic Studies  08/2021 cath  Prox LAD lesion is 50% stenosed.   Prox Cx lesion is 10% stenosed.   Ost Cx lesion is 10% stenosed.   Dist Cx lesion is 30% stenosed.   Mid RCA lesion is 5% stenosed.   Non-stenotic Mid LAD lesion was previously treated.   The left ventricular systolic function is normal.   LV end diastolic pressure is normal.   The left ventricular ejection fraction is greater than 65% by visual estimate.   Mild -moderate CAD with previously noted smooth 50% LAD stenosis after the first diagonal and septal perforating artery with a widely patent mid LAD stent; mild nonobstructive left circumflex stenosis in a co-dominant vessel; and superior takeoff RCA with smooth very mild 5% intimal hyperplasia within the  mid RCA stent.   Hyperdynamic LV function with EF estimated at least 65%.  LVEDP 11 mmHg.   RECOMMENDATION: Medical therapy.   Assessment and Plan  1.CAD with unspecified angina -recent cath as reported above - no specific cardiac symptoms, continue current meds   2. Palpitations - improved since stopping ranexa - continue to monitor   3. Hyperlipidemia - diffuse leg pains at night unclear if statin related, did improve with lowering crestor to 79m daily - continue current statin, f/u labs  4. HTN - elevated here but has not taken meds yet today, will update uKoreaMonday on home bp's        JArnoldo Lenis M.D.

## 2022-05-25 ENCOUNTER — Other Ambulatory Visit: Payer: Self-pay | Admitting: Cardiology

## 2022-05-26 ENCOUNTER — Encounter: Payer: Self-pay | Admitting: Cardiology

## 2022-05-26 ENCOUNTER — Other Ambulatory Visit: Payer: Self-pay | Admitting: *Deleted

## 2022-05-26 MED ORDER — NADOLOL 80 MG PO TABS: 120.0000 mg | ORAL_TABLET | Freq: Every day | ORAL | 6 refills | Status: DC

## 2022-05-26 NOTE — Telephone Encounter (Signed)
After review of chart, it appears patient has been taking nadolol 80 mg 1.5 tablets daily New prescription sent to [facility].

## 2022-05-31 NOTE — Telephone Encounter (Signed)
Would continue how he has been taking, if taking 155m daily of nadolol can continue that dosing  J Talaya Lamprecht MD

## 2022-05-31 NOTE — Telephone Encounter (Signed)
Sent on 05/26/2022

## 2022-06-25 ENCOUNTER — Other Ambulatory Visit: Payer: Self-pay | Admitting: Family Medicine

## 2022-06-29 ENCOUNTER — Other Ambulatory Visit: Payer: Self-pay | Admitting: *Deleted

## 2022-06-29 MED ORDER — AMLODIPINE BESYLATE 5 MG PO TABS: 5.0000 mg | ORAL_TABLET | Freq: Every day | ORAL | 0 refills | Status: DC

## 2022-07-12 ENCOUNTER — Other Ambulatory Visit: Payer: Self-pay | Admitting: Family Medicine

## 2022-07-12 MED ORDER — METFORMIN HCL 500 MG PO TABS: 500.0000 mg | ORAL_TABLET | Freq: Two times a day (BID) | ORAL | 1 refills | Status: DC

## 2022-07-13 DIAGNOSIS — F4312 Post-traumatic stress disorder, chronic: Secondary | ICD-10-CM | POA: Diagnosis not present

## 2022-07-19 ENCOUNTER — Telehealth: Payer: Self-pay

## 2022-07-19 MED ORDER — ROSUVASTATIN CALCIUM 10 MG PO TABS: 10.0000 mg | ORAL_TABLET | Freq: Every day | ORAL | 1 refills | Status: DC

## 2022-07-19 NOTE — Telephone Encounter (Signed)
-----   Message from Arnoldo Lenis, MD sent at 07/19/2022  3:47 PM EDT -----  ----- Message ----- From: Corky Mull, CPhT Sent: 07/12/2022   2:43 PM EDT To: Arnoldo Lenis, MD  Dear Dr. Harl Bowie,  I work with [facility] for medication adherence and Mr. Quintin would like a 90 day supply of the Rosuvastatin 10 mg sent to his Pharmacy please.  Thank you,  Lezlie Lye, [place] [phone]

## 2022-07-21 ENCOUNTER — Ambulatory Visit (INDEPENDENT_AMBULATORY_CARE_PROVIDER_SITE_OTHER): Payer: Medicare Other

## 2022-07-21 DIAGNOSIS — I442 Atrioventricular block, complete: Secondary | ICD-10-CM | POA: Diagnosis not present

## 2022-07-26 DIAGNOSIS — L400 Psoriasis vulgaris: Secondary | ICD-10-CM | POA: Diagnosis not present

## 2022-07-26 DIAGNOSIS — L281 Prurigo nodularis: Secondary | ICD-10-CM | POA: Diagnosis not present

## 2022-07-27 ENCOUNTER — Telehealth: Payer: Self-pay | Admitting: Cardiology

## 2022-07-27 LAB — CUP PACEART REMOTE DEVICE CHECK
Battery Remaining Longevity: 16 mo
Battery Voltage: 2.89 V
Brady Statistic AP VP Percent: 42.48 %
Brady Statistic AP VS Percent: 0.38 %
Brady Statistic AS VP Percent: 54.36 %
Brady Statistic AS VS Percent: 2.78 %
Brady Statistic RA Percent Paced: 38.98 %
Brady Statistic RV Percent Paced: 89.73 %
Date Time Interrogation Session: 20240411095243
Implantable Lead Connection Status: 753985
Implantable Lead Connection Status: 753985
Implantable Lead Connection Status: 753985
Implantable Lead Implant Date: 20151217
Implantable Lead Implant Date: 20151217
Implantable Lead Implant Date: 20151217
Implantable Lead Location: 753858
Implantable Lead Location: 753859
Implantable Lead Location: 753860
Implantable Lead Model: 4196
Implantable Lead Model: 5076
Implantable Lead Model: 5076
Implantable Pulse Generator Implant Date: 20151217
Lead Channel Impedance Value: 380 Ohm
Lead Channel Impedance Value: 399 Ohm
Lead Channel Impedance Value: 418 Ohm
Lead Channel Impedance Value: 437 Ohm
Lead Channel Impedance Value: 570 Ohm
Lead Channel Impedance Value: 589 Ohm
Lead Channel Impedance Value: 665 Ohm
Lead Channel Impedance Value: 684 Ohm
Lead Channel Impedance Value: 969 Ohm
Lead Channel Pacing Threshold Amplitude: 0.75 V
Lead Channel Pacing Threshold Amplitude: 0.75 V
Lead Channel Pacing Threshold Amplitude: 1 V
Lead Channel Pacing Threshold Pulse Width: 0.4 ms
Lead Channel Pacing Threshold Pulse Width: 0.4 ms
Lead Channel Pacing Threshold Pulse Width: 0.4 ms
Lead Channel Sensing Intrinsic Amplitude: 0.5 mV
Lead Channel Sensing Intrinsic Amplitude: 0.5 mV
Lead Channel Sensing Intrinsic Amplitude: 17.875 mV
Lead Channel Sensing Intrinsic Amplitude: 17.875 mV
Lead Channel Setting Pacing Amplitude: 2 V
Lead Channel Setting Pacing Amplitude: 2.25 V
Lead Channel Setting Pacing Amplitude: 2.5 V
Lead Channel Setting Pacing Pulse Width: 0.4 ms
Lead Channel Setting Pacing Pulse Width: 0.4 ms
Lead Channel Setting Sensing Sensitivity: 0.9 mV
Zone Setting Status: 755011
Zone Setting Status: 755011

## 2022-07-27 MED ORDER — PROPRANOLOL HCL ER 80 MG PO CP24: 80.0000 mg | ORAL_CAPSULE | Freq: Every day | ORAL | 6 refills | Status: DC

## 2022-07-27 MED ORDER — NITROGLYCERIN 0.4 MG SL SUBL: 0.4000 mg | SUBLINGUAL_TABLET | SUBLINGUAL | 3 refills | Status: AC | PRN

## 2022-07-27 NOTE — Telephone Encounter (Signed)
Remote transmission received.  Presenting rhythm ventricular bigeminy.

## 2022-07-27 NOTE — Telephone Encounter (Signed)
Patient c/o Palpitations:  High priority if patient c/o lightheadedness, shortness of breath, or chest pain  How long have you had palpitations/irregular HR/ Afib? Are you having the symptoms now? About a week its been real strong and yes  Are you currently experiencing lightheadedness, SOB or CP? Lightheadedness, chest pain and SOB   Do you have a history of afib (atrial fibrillation) or irregular heart rhythm? Irregular heart rhythm   Have you checked your BP or HR? (document readings if available): BP keeps erroring out on device   Are you experiencing any other symptoms?

## 2022-07-27 NOTE — Telephone Encounter (Signed)
Reports increased palpitations, SOB, chest tightness rated 4/10 that started 2 weeks ago. Reports active symptoms now with chest pain rated 4/10. Does not sound SOB on phone. O2 Sat 93 and HR reading 32 then 65. Has not checked BP. Denies dizziness. Reports taking alprazolam 0.5 and symptoms improved. Says he's had these symptoms for years but they have been worse for the past 2 weeks. Denies sugar or caffeine in diet. Reports starting keto diet for 1 month. Reports that he does not drink a lot of water. Medication reviewed. Has not taken nitroglycerin due to tablets being old. Nitro refill sent to [facility]. Advised to send PPM transmission today since he forgot to do it on yesterday. Advised that message would be sent to provider and if his symptoms get worse, to go to the ED for an evaluation. Says he went to the ED for these symptoms and nothing was done to help him so he prefers not to be sent to the ED. Advised that ED may be the best option. Verbalized understanding of plan.

## 2022-07-27 NOTE — Telephone Encounter (Signed)
Per Dr. Sonnie Alamo like he's having frequent PVCs. May have him switch from nadolol to propranolol. See if any changes in medications recently.  Medications reviewed  Per Dr. Marijo Sanes nadolol and start proparanolol LA 80 daily   Patient and wife informed and verbalized understanding of plan.

## 2022-07-31 ENCOUNTER — Other Ambulatory Visit: Payer: Self-pay | Admitting: Family Medicine

## 2022-08-01 ENCOUNTER — Encounter: Payer: Self-pay | Admitting: Cardiovascular Disease

## 2022-08-07 MED ORDER — PROPRANOLOL HCL ER 160 MG PO CP24: 160.0000 mg | ORAL_CAPSULE | Freq: Every day | ORAL | 3 refills | Status: AC

## 2022-08-14 ENCOUNTER — Telehealth: Payer: Self-pay | Admitting: Family Medicine

## 2022-08-14 DIAGNOSIS — E1169 Type 2 diabetes mellitus with other specified complication: Secondary | ICD-10-CM

## 2022-08-14 DIAGNOSIS — Z79899 Other long term (current) drug therapy: Secondary | ICD-10-CM

## 2022-08-14 DIAGNOSIS — Z125 Encounter for screening for malignant neoplasm of prostate: Secondary | ICD-10-CM

## 2022-08-14 DIAGNOSIS — I10 Essential (primary) hypertension: Secondary | ICD-10-CM

## 2022-08-14 DIAGNOSIS — E118 Type 2 diabetes mellitus with unspecified complications: Secondary | ICD-10-CM

## 2022-08-14 NOTE — Telephone Encounter (Signed)
Patient has physical on 5/7 and needing labs done

## 2022-08-15 NOTE — Telephone Encounter (Signed)
Lipid, CMP with GFR, CBC, PSA, urine ACR, A1c  Diagnosis prediabetes, screening prostate cancer, BPH, HTN, wellness, please make sure patient is aware that a urine to check for protein as part of this testing thank you

## 2022-08-16 ENCOUNTER — Encounter: Payer: Self-pay | Admitting: Family Medicine

## 2022-08-16 ENCOUNTER — Encounter: Payer: Self-pay | Admitting: Cardiovascular Disease

## 2022-08-16 ENCOUNTER — Ambulatory Visit: Payer: Medicare Other | Attending: Cardiovascular Disease | Admitting: Cardiovascular Disease

## 2022-08-16 VITALS — BP 142/82 | Ht 71.0 in | Wt 221.0 lb

## 2022-08-16 DIAGNOSIS — I493 Ventricular premature depolarization: Secondary | ICD-10-CM | POA: Diagnosis not present

## 2022-08-16 MED ORDER — AMIODARONE HCL 200 MG PO TABS: ORAL_TABLET | ORAL | 3 refills | Status: DC

## 2022-08-16 NOTE — Patient Instructions (Signed)
Medication Instructions:  START Amiodarone 200 mg tablet twice daily for 14 days, then DECREASE to 1 tablet once daily  *If you need a refill on your cardiac medications before your next appointment, please call your pharmacy*   Lab Work: TSH and CMP after starting  If you have labs (blood work) drawn today and your tests are completely normal, you will receive your results only by: MyChart Message (if you have MyChart) OR A paper copy in the mail If you have any lab test that is abnormal or we need to change your treatment, we will call you to review the results.   Follow-Up: At [facility], you and your health needs are our priority.  As part of our continuing mission to provide you with exceptional heart care, we have created designated Provider Care Teams.  These Care Teams include your primary Cardiologist (physician) and Advanced Practice Providers (APPs -  Physician Assistants and Nurse Practitioners) who all work together to provide you with the care you need, when you need it.  We recommend signing up for the patient portal called "MyChart".  Sign up information is provided on this After Visit Summary.  MyChart is used to connect with patients for Virtual Visits (Telemedicine).  Patients are able to view lab/test results, encounter notes, upcoming appointments, etc.  Non-urgent messages can be sent to your provider as well.   To learn more about what you can do with MyChart, go to [email or web address].    Your next appointment:   1 month(s)  Provider:   York Pellant, MD

## 2022-08-16 NOTE — Progress Notes (Signed)
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 male who presents today for routine electrophysiology followup.  Since last being seen in our clinic, the patient reports doing reasonably well.   He has a history of PVC which, for a while, he attributed to ranolazine as they improved after he discontinued that medication.  However, in follow-up with me he had recurrence of PVCs, about 100 an hour detected by his device.  The PVCs correlate with palpitations and associated symptoms including chest pressure and head fullness.  The symptoms are not present when he is not having palpitations.  We discontinued is not nadolol and place him on propranolol and increase the dose.  This did not help him from a symptomatic standpoint, but his PVC burden did decline from 100/h to 75/h.  He returns today to discuss options going forward.    Past Medical History:  Diagnosis Date   ADD (attention deficit disorder)    Rx-Adderall   Anginal pain (HCC)    Anxiety    on meds   Aortic atherosclerosis (HCC) 05/27/2020   Seen on x-ray being treated with statin   AV block, 3rd degree (HCC)    Colon polyps 2011   tubular adenoma by excisional biopsy during colonoscopy   Complete heart block (HCC)    a. s/p PPM Placement in 12/2012 with Medtronic CRT-P placement in 03/2014   Coronary artery disease    a. s/p PCI of the RCA in 2014 b. 08/2018: patent RCA stent with new mid-LAD 85% stenosis (treated with PCI/DES) and 85% distal OM2 stenosis (med management recommended)   Degenerative disc disease, cervical    Depression    PTSD   DM (diabetes mellitus) (HCC)    on meds   Gastrointestinal bleeding 10/15/2017   GERD (gastroesophageal reflux disease)    Hyperlipidemia    on meds   Hypertension    on meds   Pacemaker    Palpitations 2003   Holter in 2003: PACs and PVCs; negative stress nuclear test in 2005; right bundle branch block;  echo in 2008-mild LVH, otherwise normal; 2008-negative stress nuclear test.   Pneumonia    Prostatitis    prostate calcifications by CT   Seasonal allergies    Sleep apnea    questionable diagnosis   Past Surgical History:  Procedure Laterality Date   ANTERIOR CERVICAL DECOMP/DISCECTOMY FUSION     BACK SURGERY     Biventricular pacemaker upgrade/ system revision  04/02/2014   removal of previous atrial and ventricular leads with placement of a new MDT Consulta CRT-P system at [facility] by Dr Lorenza Cambridge   COLONOSCOPY  2017   MS-MAC-suprep(good)-leiomyoma/TA   COLONOSCOPY W/ POLYPECTOMY  2011   tubular adenoma   CORONARY BALLOON ANGIOPLASTY N/A 06/15/2020   Procedure: CORONARY BALLOON ANGIOPLASTY;  Surgeon: Corky Crafts, MD;  Location: [facility] INVASIVE CV LAB;  Service: Cardiovascular;  Laterality: N/A;   CORONARY STENT INTERVENTION N/A 08/22/2018   Procedure: CORONARY STENT INTERVENTION;  Surgeon: Lennette Bihari, MD;  Location: [facility] INVASIVE CV LAB;  Service: Cardiovascular;  Laterality: N/A;   CORONARY ULTRASOUND/IVUS N/A 06/15/2020   Procedure: Intravascular Ultrasound/IVUS;  Surgeon: Corky Crafts, MD;  Location: [facility] INVASIVE CV LAB;  Service: Cardiovascular;  Laterality: N/A;   ESOPHAGOGASTRODUODENOSCOPY     Gastritis   INSERT / REPLACE / REMOVE PACEMAKER  12/17/2012   MDT Adapta L implanted by Dr Johney Frame  for mobitz II second degree AV block   KNEE SURGERY Right    LEFT HEART CATH AND CORONARY ANGIOGRAPHY N/A 08/22/2018   Procedure: LEFT HEART CATH AND CORONARY ANGIOGRAPHY;  Surgeon: Lennette Bihari, MD;  Location: [facility] INVASIVE CV LAB;  Service: Cardiovascular;  Laterality: N/A;   LEFT HEART CATH AND CORONARY ANGIOGRAPHY N/A 06/15/2020   Procedure: LEFT HEART CATH AND CORONARY ANGIOGRAPHY;  Surgeon: Corky Crafts, MD;  Location: [facility] INVASIVE CV LAB;  Service: Cardiovascular;  Laterality: N/A;   LEFT HEART CATH AND CORONARY ANGIOGRAPHY N/A 09/01/2021    Procedure: LEFT HEART CATH AND CORONARY ANGIOGRAPHY;  Surgeon: Lennette Bihari, MD;  Location: [facility] INVASIVE CV LAB;  Service: Cardiovascular;  Laterality: N/A;   LEFT HEART CATHETERIZATION WITH CORONARY ANGIOGRAM N/A 10/17/2012   Procedure: LEFT HEART CATHETERIZATION WITH CORONARY ANGIOGRAM;  Surgeon: Wendall Stade, MD;  Location: [facility] CATH LAB;  Service: Cardiovascular;  Laterality: N/A;   LEFT HEART CATHETERIZATION WITH CORONARY ANGIOGRAM N/A 12/17/2012   Procedure: LEFT HEART CATHETERIZATION WITH CORONARY ANGIOGRAM;  Surgeon: Peter M Swaziland, MD;  Location: [facility] CATH LAB;  Service: Cardiovascular;  Laterality: N/A;   LEFT HEART CATHETERIZATION WITH CORONARY ANGIOGRAM N/A 06/11/2013   Procedure: LEFT HEART CATHETERIZATION WITH CORONARY ANGIOGRAM;  Surgeon: Iran Ouch, MD;  Location: [facility] CATH LAB;  Service: Cardiovascular;  Laterality: N/A;   PERCUTANEOUS CORONARY STENT INTERVENTION (PCI-S)  10/17/2012   Procedure: PERCUTANEOUS CORONARY STENT INTERVENTION (PCI-S);  Surgeon: Wendall Stade, MD;  Location: [facility] CATH LAB;  Service: Cardiovascular;;   PERMANENT PACEMAKER INSERTION N/A 12/17/2012   Procedure: PERMANENT PACEMAKER INSERTION;  Surgeon: Hillis Range, MD;  Location: [facility] CATH LAB;  Service: Cardiovascular;  Laterality: N/A;   POLYPECTOMY  2017   TA   UPPER GASTROINTESTINAL ENDOSCOPY      ROS- all systems are reviewed and negative except as per HPI above  Current Outpatient Medications  Medication Sig Dispense Refill   acetaminophen (TYLENOL) 500 MG tablet Take 1,500 mg by mouth daily as needed for moderate pain.     albuterol (VENTOLIN HFA) 108 (90 Base) MCG/ACT inhaler TAKE 2 PUFFS BY MOUTH EVERY 6 HOURS AS NEEDED FOR WHEEZE OR SHORTNESS OF BREATH 18 each 1   ALPRAZolam (XANAX) 0.5 MG tablet 1/2-1 twice daily as needed home use only use sparingly 20 tablet 0   amiodarone (PACERONE) 200 MG tablet Take 1 tablet by mouth twice daily for 14 days, then DECREASE to 1 tablet once daily 90 tablet  3   amLODipine (NORVASC) 5 MG tablet Take 1 tablet (5 mg total) by mouth daily. 90 tablet 0   aspirin EC 81 MG tablet Take 81 mg by mouth daily. Swallow whole.     HYDROmorphone (DILAUDID) 4 MG tablet Take 1 tablet (4 mg total) by mouth every 6 (six) hours as needed for severe pain. 20 tablet 0   isosorbide mononitrate (IMDUR) 120 MG 24 hr tablet TAKE 1 TABLET(120 MG) BY MOUTH DAILY 90 tablet 2   losartan (COZAAR) 100 MG tablet TAKE 1 TABLET(100 MG) BY MOUTH DAILY 90 tablet 0   metFORMIN (GLUCOPHAGE) 500 MG tablet Take 1 tablet (500 mg total) by mouth 2 (two) times daily. 180 tablet 1   nitroGLYCERIN (NITROSTAT) 0.4 MG SL tablet Place 1 tablet (0.4 mg total) under the tongue every 5 (five) minutes x 3 doses as needed for chest pain (if no relief after 3rd dose, proceed to ED for an evaluation). 25 tablet 3   ondansetron (ZOFRAN) 8  MG tablet Take 1 tablet (8 mg total) by mouth every 8 (eight) hours as needed for nausea. 15 tablet 3   propranolol ER (INDERAL LA) 160 MG SR capsule Take 1 capsule (160 mg total) by mouth daily. 90 capsule 3   rosuvastatin (CRESTOR) 10 MG tablet Take 1 tablet (10 mg total) by mouth daily. (Patient taking differently: Take 5 mg by mouth daily.) 90 tablet 1   tamsulosin (FLOMAX) 0.4 MG CAPS capsule TAKE 1 CAPSULE(0.4 MG) BY MOUTH DAILY (Patient taking differently: 0.8 mg daily.) 90 capsule 0   Tetrahydrozoline HCl (VISINE OP) Place 1 drop into both eyes daily as needed (allergies).     Current Facility-Administered Medications  Medication Dose Route Frequency Provider Last Rate Last Admin   sodium chloride flush (NS) 0.9 % injection 3 mL  3 mL Intravenous Q12H Antoine Poche, MD        Physical Exam: Vitals:   08/16/22 1055  BP: (!) 142/82  SpO2: 94%  Weight: 221 lb (100.2 kg)  Height: 5\' 11"  (1.803 m)     GEN- The patient is well appearing, alert and oriented x 3 today.   Lungs- Clear to ausculation bilaterally, normal work of breathing Chest- pacemaker  pocket is well healed Heart- Regular rate and rhythm, no murmurs, rubs or gallops Extremities- no clubbing, cyanosis, or edema  Pacemaker interrogation- reviewed in detail today,  See PACEART report   ECG - A-sensed, V-paced. Frequent PVCs  Assessment and Plan:  1. Symptomatic complete heart block Normal pacemaker function  See Pace Art report No changes today he is device dependant today  2. PVCs: Occurring about 75 per hour now -- improved from 100/hr at the previous visit Still very symptomatic. Will start amiodarone -- 200mg  PO BID loading, followed by 200mg  daily. Follow-up with TSH, CMP in 1 month   3. Fatigue: EF normal, 60-65%. He has progressed with weight loss efforts.  4. CAD S/p cath 5/23 which showed nonobstructive dz Previously s/p PTCA of mid RCA  5. HTN Stable No change required today  6. HL On statin    Maurice Small, MD  08/16/2022 11:43 AM

## 2022-08-17 NOTE — Telephone Encounter (Signed)
Blood work ordered in EPIC. Patient notified. 

## 2022-08-21 DIAGNOSIS — Z125 Encounter for screening for malignant neoplasm of prostate: Secondary | ICD-10-CM | POA: Diagnosis not present

## 2022-08-21 DIAGNOSIS — E1169 Type 2 diabetes mellitus with other specified complication: Secondary | ICD-10-CM | POA: Diagnosis not present

## 2022-08-21 DIAGNOSIS — E118 Type 2 diabetes mellitus with unspecified complications: Secondary | ICD-10-CM | POA: Diagnosis not present

## 2022-08-21 DIAGNOSIS — E785 Hyperlipidemia, unspecified: Secondary | ICD-10-CM | POA: Diagnosis not present

## 2022-08-21 DIAGNOSIS — Z79899 Other long term (current) drug therapy: Secondary | ICD-10-CM | POA: Diagnosis not present

## 2022-08-22 ENCOUNTER — Encounter: Payer: Self-pay | Admitting: Family Medicine

## 2022-08-22 ENCOUNTER — Ambulatory Visit (INDEPENDENT_AMBULATORY_CARE_PROVIDER_SITE_OTHER): Payer: Medicare Other | Admitting: Family Medicine

## 2022-08-22 VITALS — BP 138/78 | HR 66 | Ht 70.75 in | Wt 218.8 lb

## 2022-08-22 DIAGNOSIS — E118 Type 2 diabetes mellitus with unspecified complications: Secondary | ICD-10-CM | POA: Diagnosis not present

## 2022-08-22 DIAGNOSIS — Z7984 Long term (current) use of oral hypoglycemic drugs: Secondary | ICD-10-CM

## 2022-08-22 DIAGNOSIS — Z0001 Encounter for general adult medical examination with abnormal findings: Secondary | ICD-10-CM

## 2022-08-22 DIAGNOSIS — I10 Essential (primary) hypertension: Secondary | ICD-10-CM | POA: Diagnosis not present

## 2022-08-22 DIAGNOSIS — E1169 Type 2 diabetes mellitus with other specified complication: Secondary | ICD-10-CM | POA: Diagnosis not present

## 2022-08-22 DIAGNOSIS — E785 Hyperlipidemia, unspecified: Secondary | ICD-10-CM

## 2022-08-22 DIAGNOSIS — R002 Palpitations: Secondary | ICD-10-CM

## 2022-08-22 DIAGNOSIS — Z0000 Encounter for general adult medical examination without abnormal findings: Secondary | ICD-10-CM

## 2022-08-22 LAB — CBC WITH DIFFERENTIAL/PLATELET
Basophils Absolute: 0 10*3/uL (ref 0.0–0.2)
Basos: 1 %
EOS (ABSOLUTE): 0.2 10*3/uL (ref 0.0–0.4)
Eos: 3 %
Hematocrit: 51.6 % — ABNORMAL HIGH (ref 37.5–51.0)
Hemoglobin: 17 g/dL (ref 13.0–17.7)
Immature Grans (Abs): 0 10*3/uL (ref 0.0–0.1)
Immature Granulocytes: 0 %
Lymphocytes Absolute: 1.2 10*3/uL (ref 0.7–3.1)
Lymphs: 21 %
MCH: 29.4 pg (ref 26.6–33.0)
MCHC: 32.9 g/dL (ref 31.5–35.7)
MCV: 89 fL (ref 79–97)
Monocytes Absolute: 0.4 10*3/uL (ref 0.1–0.9)
Monocytes: 7 %
Neutrophils Absolute: 4 10*3/uL (ref 1.4–7.0)
Neutrophils: 68 %
Platelets: 190 10*3/uL (ref 150–450)
RBC: 5.78 x10E6/uL (ref 4.14–5.80)
RDW: 13.2 % (ref 11.6–15.4)
WBC: 5.9 10*3/uL (ref 3.4–10.8)

## 2022-08-22 LAB — CMP14+EGFR
ALT: 23 IU/L (ref 0–44)
AST: 23 IU/L (ref 0–40)
Albumin/Globulin Ratio: 2.4 — ABNORMAL HIGH (ref 1.2–2.2)
Albumin: 5 g/dL — ABNORMAL HIGH (ref 3.9–4.9)
Alkaline Phosphatase: 58 IU/L (ref 44–121)
BUN/Creatinine Ratio: 13 (ref 10–24)
BUN: 14 mg/dL (ref 8–27)
Bilirubin Total: 1 mg/dL (ref 0.0–1.2)
CO2: 24 mmol/L (ref 20–29)
Calcium: 10.3 mg/dL — ABNORMAL HIGH (ref 8.6–10.2)
Chloride: 102 mmol/L (ref 96–106)
Creatinine, Ser: 1.06 mg/dL (ref 0.76–1.27)
Globulin, Total: 2.1 g/dL (ref 1.5–4.5)
Glucose: 130 mg/dL — ABNORMAL HIGH (ref 70–99)
Potassium: 4.6 mmol/L (ref 3.5–5.2)
Sodium: 143 mmol/L (ref 134–144)
Total Protein: 7.1 g/dL (ref 6.0–8.5)
eGFR: 77 mL/min/{1.73_m2} (ref >59)

## 2022-08-22 LAB — LIPID PANEL
Chol/HDL Ratio: 2.7 ratio (ref 0.0–5.0)
Cholesterol, Total: 159 mg/dL (ref 100–199)
HDL: 58 mg/dL (ref >39)
LDL Chol Calc (NIH): 82 mg/dL (ref 0–99)
Triglycerides: 107 mg/dL (ref 0–149)
VLDL Cholesterol Cal: 19 mg/dL (ref 5–40)

## 2022-08-22 LAB — MICROALBUMIN / CREATININE URINE RATIO
Creatinine, Urine: 236.9 mg/dL
Microalb/Creat Ratio: 9 mg/g creat (ref 0–29)
Microalbumin, Urine: 22.4 ug/mL

## 2022-08-22 LAB — PSA: Prostate Specific Ag, Serum: 2.5 ng/mL (ref 0.0–4.0)

## 2022-08-22 LAB — HEMOGLOBIN A1C
Est. average glucose Bld gHb Est-mCnc: 137 mg/dL
Hgb A1c MFr Bld: 6.4 % — ABNORMAL HIGH (ref 4.8–5.6)

## 2022-08-22 MED ORDER — HYDROMORPHONE HCL 4 MG PO TABS: ORAL_TABLET | ORAL | 0 refills | Status: DC

## 2022-08-22 MED ORDER — ROSUVASTATIN CALCIUM 10 MG PO TABS: 10.0000 mg | ORAL_TABLET | Freq: Every day | ORAL | 1 refills | Status: DC

## 2022-08-22 MED ORDER — AMLODIPINE BESYLATE 5 MG PO TABS: 5.0000 mg | ORAL_TABLET | Freq: Every day | ORAL | 1 refills | Status: DC

## 2022-08-22 MED ORDER — METFORMIN HCL 500 MG PO TABS: 500.0000 mg | ORAL_TABLET | Freq: Two times a day (BID) | ORAL | 1 refills | Status: DC

## 2022-08-22 MED ORDER — LOSARTAN POTASSIUM 100 MG PO TABS: ORAL_TABLET | ORAL | 1 refills | Status: DC

## 2022-08-22 NOTE — Progress Notes (Signed)
Seen  Subjective:    Patient ID: [name], male    DOB: 1955/09/11, 67 y.o.   MRN: [id number]  HPI AWV- Annual Wellness Visit  The patient was seen for their annual wellness visit. The patient's past medical history, surgical history, and family history were reviewed. Pertinent vaccines were reviewed ( tetanus, pneumonia, shingles, flu) The patient's medication list was reviewed and updated.  The height and weight were entered.  BMI recorded in electronic record elsewhere  Cognitive screening was completed. Outcome of Mini - Cog: 5   Falls /depression screening electronically recorded within record elsewhere  Current tobacco usage:no (All patients who use tobacco were given written and verbal information on quitting)  Recent listing of emergency department/hospitalizations over the past year were reviewed.  current specialist the patient sees on a regular basis: cardiology    Medicare annual wellness visit patient questionnaire was reviewed.  A written screening schedule for the patient for the next 5-10 years was given. Appropriate discussion of followup regarding next visit was discussed.   0  Review of Systems     Objective:   Physical Exam General-in no acute distress Eyes-no discharge Lungs-respiratory rate normal, CTA CV-no murmurs,RRR Extremities skin warm dry no edema Neuro grossly normal Behavior normal, alert After shared discussion will defer on prostate exam       Assessment & Plan:  1. Encounter for subsequent annual wellness visit (AWV) in Medicare patient Adult wellness-complete.wellness physical was conducted today. Importance of diet and exercise were discussed in detail.  Importance of stress reduction and healthy living were discussed.  In addition to this a discussion regarding safety was also covered.  We also reviewed over immunizations and gave recommendations regarding current immunization needed for age.   In addition to this  additional areas were also touched on including: Preventative health exams needed:  Colonoscopy 2028  Patient was advised yearly wellness exam   2. Hyperlipidemia associated with type 2 diabetes mellitus (HCC) Patient relates he is taking 5 mg daily currently on encouraged him to go back to 10 mg to get LDL below 70  3. DM type 2, controlled, with complication (HCC) A1c reasonable continue current measures patient contemplating GLP-1 information given  4. Primary hypertension Blood pressure good control - Ambulatory referral to Cardiology  5. Hypercalcemia We will recheck a calcium level - Ambulatory referral to Cardiology  6. Palpitations Recently cardiologist wanted to put him on amiodarone this concerned him he does not want to go on amiodarone.  He would like to get a second opinion We will refer to Dr. Bethann Punches practice.

## 2022-08-22 NOTE — Patient Instructions (Signed)
Discussion of GLP-1 medications Please remember these medications are to assist with weight reduction and diabetes control.  They do not take the place of healthy eating and regular physical activity. There are several GLP-1 medications that can help with weight reduction and diabetes control.  GLP-1 medications with indication for diabetes-Trulicity, Victoza, Bydureon , Mounjaro, Ozempic, and Rybelsus (although these are injected medicines except for Rybelsus which is a pill) GLP-1 medications with indications for weight loss-Wegovy, and Saxenda  Mechanism of action  These medications stimulate glucagon peptide receptors.  By doing so it does the following:  1-slows down stomach emptying-essentially food takes longer to go through the stomach and the intestines-this can lessen appetite  2-reduces glucagon secretion from the pancreas-this helps keep blood glucose levels stable between meals  3-increase his insulin release from the pancreas-with diabetics this helps keep blood glucose levels stable  4-promotes the feeling of being full in the brain-encouraged him receptors in the brain received the signal so the brain and body know that it is time to stop eating Benefits of the medication  1-reduced weight-reduction of weight is more significant at higher dosing.  Not as much weight reduction with Rybelsus   A- typically Wegovy at higher doses can assist with significant weight reduction.  2-improved blood glucose levels-with diabetics typically we see a significant drop with A1c when the medication is adjusted appropriately.  3-decrease risk of heart attacks and strokes-individuals with type 2 diabetes are at increased risk of heart attacks and strokes.  These GLP-1 medications can reduce the risk by 22%.  Risk of GLP-1 medications-these are some of the risks.  It is important to talk with your provider in a shared discussion before starting any medication.  Contraindications to GLP-1  medications-in other words who should not take these.  1-individuals with history of thyroid medullary cancer  2-individuals with family history of multiple endocrine neoplasm syndrome type II (MEN 2)  3-individuals with a history of pancreatitis  4-those with a history of severe hypersensitivity or allergy reactions to GLP-1 medications  5-should be avoided in individuals with a history of suicidal attempts or active suicidal ideation.  Cautions include- 1- risk of thyroid C-cell tumors were seen in rats during clinical testing in humans the relevancy of this information has not been determined 2-risk of pancreatitis including fatal and nonfatal hemorrhagic or necrotizing pancreatitis 3-gallbladder disease slightly increased risk of gallstones 4-hypoglycemia-more common with individuals who are on insulin or sulfonylurea such as glipizide 5-acute kidney injury or worsening of chronic renal failure often this is triggered by severe vomiting and diarrhea as side effects to GLP-1. 6-slightly increased risk of diabetic retinopathy complications in patients with type 2 diabetes 7-heart rate increase-slight increase of base heart rate with GLP-1 8-suicidal behavior and ideation has been reported in clinical trials with other weight loss medicines.  If depression occurs with medication or suicidal ideation stop medication immediately notify provider or seek help.  Common side effects include nausea, vomiting, diarrhea, constipation and increased heart rate.  Less common side effects severe abdominal pain-if this occurs notify provider stop medication get tested for pancreatitis. Full discussion with your provider should occur before starting these medicines.       

## 2022-08-23 NOTE — Progress Notes (Signed)
Remote pacemaker transmission.   

## 2022-08-29 NOTE — Addendum Note (Signed)
Addended by: Margaretha Sheffield on: 08/29/2022 11:49 AM   Modules accepted: Orders

## 2022-09-10 ENCOUNTER — Other Ambulatory Visit: Payer: Self-pay | Admitting: Family Medicine

## 2022-09-13 MED ORDER — TAMSULOSIN HCL 0.4 MG PO CAPS: ORAL_CAPSULE | ORAL | 0 refills | Status: DC

## 2022-09-14 ENCOUNTER — Ambulatory Visit: Payer: Medicare Other

## 2022-09-15 ENCOUNTER — Ambulatory Visit: Payer: Medicare Other | Attending: Cardiovascular Disease | Admitting: Cardiovascular Disease

## 2022-09-15 NOTE — Progress Notes (Deleted)
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 male who presents today for routine electrophysiology followup.  Since last being seen in our clinic, the patient reports doing reasonably well.   He has a history of PVC which, for a while, he attributed to ranolazine as they improved after he discontinued that medication.  However, in follow-up with me he had recurrence of PVCs, about 100 an hour detected by his device.  The PVCs correlate with palpitations and associated symptoms including chest pressure and head fullness.  The symptoms are not present when he is not having palpitations.  We discontinued is not nadolol and place him on propranolol and increase the dose.  This did not help him from a symptomatic standpoint, but his PVC burden did decline from 100/h to 75/h.  He returns today to discuss options going forward.    Past Medical History:  Diagnosis Date   ADD (attention deficit disorder)    Rx-Adderall   Anginal pain (HCC)    Anxiety    on meds   Aortic atherosclerosis (HCC) 05/27/2020   Seen on x-ray being treated with statin   AV block, 3rd degree (HCC)    Colon polyps 2011   tubular adenoma by excisional biopsy during colonoscopy   Complete heart block (HCC)    a. s/p PPM Placement in 12/2012 with Medtronic CRT-P placement in 03/2014   Coronary artery disease    a. s/p PCI of the RCA in 2014 b. 08/2018: patent RCA stent with new mid-LAD 85% stenosis (treated with PCI/DES) and 85% distal OM2 stenosis (med management recommended)   Degenerative disc disease, cervical    Depression    PTSD   DM (diabetes mellitus) (HCC)    on meds   Gastrointestinal bleeding 10/15/2017   GERD (gastroesophageal reflux disease)    Hyperlipidemia    on meds   Hypertension    on meds   Pacemaker    Palpitations 2003   Holter in 2003: PACs and PVCs; negative stress nuclear test in 2005; right bundle branch block;  echo in 2008-mild LVH, otherwise normal; 2008-negative stress nuclear test.   Pneumonia    Prostatitis    prostate calcifications by CT   Seasonal allergies    Sleep apnea    questionable diagnosis   Past Surgical History:  Procedure Laterality Date   ANTERIOR CERVICAL DECOMP/DISCECTOMY FUSION     BACK SURGERY     Biventricular pacemaker upgrade/ system revision  04/02/2014   removal of previous atrial and ventricular leads with placement of a new MDT Consulta CRT-P system at [facility] by Dr Lorenza Cambridge   COLONOSCOPY  2017   MS-MAC-suprep(good)-leiomyoma/TA   COLONOSCOPY W/ POLYPECTOMY  2011   tubular adenoma   CORONARY BALLOON ANGIOPLASTY N/A 06/15/2020   Procedure: CORONARY BALLOON ANGIOPLASTY;  Surgeon: Corky Crafts, MD;  Location: [facility] INVASIVE CV LAB;  Service: Cardiovascular;  Laterality: N/A;   CORONARY STENT INTERVENTION N/A 08/22/2018   Procedure: CORONARY STENT INTERVENTION;  Surgeon: Lennette Bihari, MD;  Location: [facility] INVASIVE CV LAB;  Service: Cardiovascular;  Laterality: N/A;   CORONARY ULTRASOUND/IVUS N/A 06/15/2020   Procedure: Intravascular Ultrasound/IVUS;  Surgeon: Corky Crafts, MD;  Location: [facility] INVASIVE CV LAB;  Service: Cardiovascular;  Laterality: N/A;   ESOPHAGOGASTRODUODENOSCOPY     Gastritis   INSERT / REPLACE / REMOVE PACEMAKER  12/17/2012   MDT Adapta L implanted by Dr Johney Frame  for mobitz II second degree AV block   KNEE SURGERY Right    LEFT HEART CATH AND CORONARY ANGIOGRAPHY N/A 08/22/2018   Procedure: LEFT HEART CATH AND CORONARY ANGIOGRAPHY;  Surgeon: Lennette Bihari, MD;  Location: [facility] INVASIVE CV LAB;  Service: Cardiovascular;  Laterality: N/A;   LEFT HEART CATH AND CORONARY ANGIOGRAPHY N/A 06/15/2020   Procedure: LEFT HEART CATH AND CORONARY ANGIOGRAPHY;  Surgeon: Corky Crafts, MD;  Location: [facility] INVASIVE CV LAB;  Service: Cardiovascular;  Laterality: N/A;   LEFT HEART CATH AND CORONARY ANGIOGRAPHY N/A 09/01/2021    Procedure: LEFT HEART CATH AND CORONARY ANGIOGRAPHY;  Surgeon: Lennette Bihari, MD;  Location: [facility] INVASIVE CV LAB;  Service: Cardiovascular;  Laterality: N/A;   LEFT HEART CATHETERIZATION WITH CORONARY ANGIOGRAM N/A 10/17/2012   Procedure: LEFT HEART CATHETERIZATION WITH CORONARY ANGIOGRAM;  Surgeon: Wendall Stade, MD;  Location: [facility] CATH LAB;  Service: Cardiovascular;  Laterality: N/A;   LEFT HEART CATHETERIZATION WITH CORONARY ANGIOGRAM N/A 12/17/2012   Procedure: LEFT HEART CATHETERIZATION WITH CORONARY ANGIOGRAM;  Surgeon: Peter M Swaziland, MD;  Location: [facility] CATH LAB;  Service: Cardiovascular;  Laterality: N/A;   LEFT HEART CATHETERIZATION WITH CORONARY ANGIOGRAM N/A 06/11/2013   Procedure: LEFT HEART CATHETERIZATION WITH CORONARY ANGIOGRAM;  Surgeon: Iran Ouch, MD;  Location: [facility] CATH LAB;  Service: Cardiovascular;  Laterality: N/A;   PERCUTANEOUS CORONARY STENT INTERVENTION (PCI-S)  10/17/2012   Procedure: PERCUTANEOUS CORONARY STENT INTERVENTION (PCI-S);  Surgeon: Wendall Stade, MD;  Location: [facility] CATH LAB;  Service: Cardiovascular;;   PERMANENT PACEMAKER INSERTION N/A 12/17/2012   Procedure: PERMANENT PACEMAKER INSERTION;  Surgeon: Hillis Range, MD;  Location: [facility] CATH LAB;  Service: Cardiovascular;  Laterality: N/A;   POLYPECTOMY  2017   TA   UPPER GASTROINTESTINAL ENDOSCOPY      ROS- all systems are reviewed and negative except as per HPI above  Current Outpatient Medications  Medication Sig Dispense Refill   acetaminophen (TYLENOL) 500 MG tablet Take 1,500 mg by mouth daily as needed for moderate pain.     albuterol (VENTOLIN HFA) 108 (90 Base) MCG/ACT inhaler TAKE 2 PUFFS BY MOUTH EVERY 6 HOURS AS NEEDED FOR WHEEZE OR SHORTNESS OF BREATH 18 each 1   ALPRAZolam (XANAX) 0.5 MG tablet 1/2-1 twice daily as needed home use only use sparingly 20 tablet 0   amiodarone (PACERONE) 200 MG tablet Take 1 tablet by mouth twice daily for 14 days, then DECREASE to 1 tablet once daily 90 tablet  3   amLODipine (NORVASC) 5 MG tablet Take 1 tablet (5 mg total) by mouth daily. 90 tablet 1   aspirin EC 81 MG tablet Take 81 mg by mouth daily. Swallow whole.     HYDROmorphone (DILAUDID) 4 MG tablet 1/2 to 1 q 6 hours prn severe pain 20 tablet 0   isosorbide mononitrate (IMDUR) 120 MG 24 hr tablet TAKE 1 TABLET(120 MG) BY MOUTH DAILY 90 tablet 2   losartan (COZAAR) 100 MG tablet 1 qd 90 tablet 1   metFORMIN (GLUCOPHAGE) 500 MG tablet Take 1 tablet (500 mg total) by mouth 2 (two) times daily. 180 tablet 1   nitroGLYCERIN (NITROSTAT) 0.4 MG SL tablet Place 1 tablet (0.4 mg total) under the tongue every 5 (five) minutes x 3 doses as needed for chest pain (if no relief after 3rd dose, proceed to ED for an evaluation). 25 tablet 3   ondansetron (ZOFRAN) 8 MG tablet Take 1 tablet (8 mg total) by mouth every 8 (eight)  hours as needed for nausea. 15 tablet 3   propranolol ER (INDERAL LA) 160 MG SR capsule Take 1 capsule (160 mg total) by mouth daily. 90 capsule 3   rosuvastatin (CRESTOR) 10 MG tablet Take 1 tablet (10 mg total) by mouth daily. 90 tablet 1   tamsulosin (FLOMAX) 0.4 MG CAPS capsule TAKE 1 CAPSULE(0.4 MG) BY MOUTH DAILY 90 capsule 0   Tetrahydrozoline HCl (VISINE OP) Place 1 drop into both eyes daily as needed (allergies).     Current Facility-Administered Medications  Medication Dose Route Frequency Provider Last Rate Last Admin   sodium chloride flush (NS) 0.9 % injection 3 mL  3 mL Intravenous Q12H Branch, Dorothe Pea, MD        Physical Exam: There were no vitals filed for this visit.    GEN- The patient is well appearing, alert and oriented x 3 today.   Lungs- Clear to ausculation bilaterally, normal work of breathing Chest- pacemaker pocket is well healed Heart- Regular rate and rhythm, no murmurs, rubs or gallops Extremities- no clubbing, cyanosis, or edema  Pacemaker interrogation- reviewed in detail today,  See PACEART report   ECG - A-sensed, V-paced. Frequent  PVCs  Assessment and Plan:  1. Symptomatic complete heart block Normal pacemaker function  See Pace Art report No changes today he is device dependant today  2. PVCs: Occurring about 75 per hour now -- improved from 100/hr at the previous visit Still very symptomatic. Will start amiodarone -- 200mg  PO BID loading, followed by 200mg  daily. Follow-up with TSH, CMP in 1 month   3. Fatigue: EF normal, 60-65%. He has progressed with weight loss efforts.  4. CAD S/p cath 5/23 which showed nonobstructive dz Previously s/p PTCA of mid RCA  5. HTN Stable No change required today  6. HL On statin    Maurice Small, MD  09/15/2022 1:32 PM

## 2022-09-19 ENCOUNTER — Other Ambulatory Visit: Payer: Self-pay | Admitting: Family Medicine

## 2022-09-19 ENCOUNTER — Encounter: Payer: Self-pay | Admitting: Family Medicine

## 2022-09-19 MED ORDER — TAMSULOSIN HCL 0.4 MG PO CAPS: ORAL_CAPSULE | ORAL | 2 refills | Status: DC

## 2022-10-17 ENCOUNTER — Ambulatory Visit (INDEPENDENT_AMBULATORY_CARE_PROVIDER_SITE_OTHER): Payer: Medicare Other | Admitting: Family Medicine

## 2022-10-17 ENCOUNTER — Encounter: Payer: Self-pay | Admitting: Family Medicine

## 2022-10-17 VITALS — BP 128/70 | HR 73 | Temp 98.1°F | Ht 70.75 in | Wt 220.0 lb

## 2022-10-17 DIAGNOSIS — M25562 Pain in left knee: Secondary | ICD-10-CM | POA: Diagnosis not present

## 2022-10-17 DIAGNOSIS — G8929 Other chronic pain: Secondary | ICD-10-CM

## 2022-10-17 DIAGNOSIS — N401 Enlarged prostate with lower urinary tract symptoms: Secondary | ICD-10-CM | POA: Diagnosis not present

## 2022-10-17 DIAGNOSIS — R350 Frequency of micturition: Secondary | ICD-10-CM

## 2022-10-17 DIAGNOSIS — K580 Irritable bowel syndrome with diarrhea: Secondary | ICD-10-CM

## 2022-10-17 DIAGNOSIS — F439 Reaction to severe stress, unspecified: Secondary | ICD-10-CM | POA: Diagnosis not present

## 2022-10-17 MED ORDER — FLUOXETINE HCL 10 MG PO CAPS: 10.0000 mg | ORAL_CAPSULE | Freq: Every day | ORAL | 3 refills | Status: DC

## 2022-10-17 MED ORDER — ALFUZOSIN HCL ER 10 MG PO TB24
10.0000 mg | ORAL_TABLET | Freq: Every day | ORAL | 4 refills | Status: DC
Start: 2022-10-17 — End: 2022-11-07

## 2022-10-17 NOTE — Progress Notes (Signed)
   Subjective:    Patient ID: [name], male    DOB: [birth year], 67 y.o.   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 Talk about tamsulosin   Review of Systems     Objective:   Physical Exam  General-in no acute distress Eyes-no discharge Lungs-respiratory rate normal, CTA CV-no murmurs,RRR Extremities skin warm dry no edema Neuro grossly normal Behavior normal, alert Knee exam is normal except for bony like nodule underneath the skin which causes him pain discomfort      Assessment & Plan:  1. Benign prostatic hyperplasia with urinary frequency Patient relates tamsulosin not helping enough we did discuss various options we will try different medication if this does not help enough then referral to urology for possible procedure  2. Chronic pain of left knee Bony nodule underneath the skin medial aspect of the knee the rest of the knee exam is normal await x-rays may need referral to general surgery to remove the bony nodule - DG Knee 1-2 Views Left  3. Stress Significant stress issues Denies being severely depressed Does not like seeing psychiatry Sees a counselor Has history  of PTSD which he states is worse Finds himself on edge a lot irritated anxious nervous but denies being suicidal denies being homicidal He is open to trying medications he has tried sertraline before in the past but only took it a couple weeks Therefore after further discussion with the patient we will try Prozac 10 mg daily I would recommend cognitive behavioral therapy as well but not quite sure where we will be able to get this will look into various resources patient somewhat interested with this but at the same time does not like talking with a lot of people Once again he assured me that he is not suicidal or homicidal he expresses the willingness to come back immediately should any problems occur otherwise follow-up in 2 to 3 weeks 4. Irritable bowel syndrome with diarrhea Imodium as needed in the morning time hopefully if we can get some of the PTSD symptoms under better control we will see improvement of his other symptoms patient denies any mucus or bloody stools

## 2022-10-20 ENCOUNTER — Ambulatory Visit (INDEPENDENT_AMBULATORY_CARE_PROVIDER_SITE_OTHER): Payer: Medicare Other

## 2022-10-20 DIAGNOSIS — I442 Atrioventricular block, complete: Secondary | ICD-10-CM

## 2022-10-23 LAB — CUP PACEART REMOTE DEVICE CHECK
Battery Remaining Longevity: 13 mo
Brady Statistic AP VP Percent: 19.14 %
Brady Statistic AP VS Percent: 0.03 %
Brady Statistic AS VS Percent: 0.25 %
Date Time Interrogation Session: 20240708153508
Implantable Lead Connection Status: 753985
Implantable Lead Implant Date: 20151217
Implantable Lead Implant Date: 20151217
Implantable Lead Location: 753858
Implantable Lead Model: 5076
Implantable Lead Model: 5076
Lead Channel Impedance Value: 342 Ohm
Lead Channel Impedance Value: 380 Ohm
Lead Channel Impedance Value: 646 Ohm
Lead Channel Pacing Threshold Amplitude: 1 V
Lead Channel Pacing Threshold Pulse Width: 0.4 ms
Lead Channel Sensing Intrinsic Amplitude: 0.5 mV
Lead Channel Sensing Intrinsic Amplitude: 17.5 mV
Lead Channel Setting Pacing Pulse Width: 0.4 ms
Zone Setting Status: 755011

## 2022-10-24 DIAGNOSIS — I493 Ventricular premature depolarization: Secondary | ICD-10-CM | POA: Diagnosis not present

## 2022-10-24 DIAGNOSIS — I2510 Atherosclerotic heart disease of native coronary artery without angina pectoris: Secondary | ICD-10-CM | POA: Diagnosis not present

## 2022-10-24 LAB — CUP PACEART REMOTE DEVICE CHECK
Battery Voltage: 2.87 V
Brady Statistic AS VP Percent: 80.58 %
Brady Statistic RA Percent Paced: 19.11 %
Brady Statistic RV Percent Paced: 99.49 %
Implantable Lead Connection Status: 753985
Implantable Lead Connection Status: 753985
Implantable Lead Implant Date: 20151217
Implantable Lead Location: 753859
Implantable Lead Location: 753860
Implantable Lead Model: 4196
Implantable Pulse Generator Implant Date: 20151217
Lead Channel Impedance Value: 380 Ohm
Lead Channel Impedance Value: 437 Ohm
Lead Channel Impedance Value: 551 Ohm
Lead Channel Impedance Value: 570 Ohm
Lead Channel Impedance Value: 703 Ohm
Lead Channel Impedance Value: 988 Ohm
Lead Channel Pacing Threshold Amplitude: 0.75 V
Lead Channel Pacing Threshold Amplitude: 0.75 V
Lead Channel Pacing Threshold Pulse Width: 0.4 ms
Lead Channel Pacing Threshold Pulse Width: 0.4 ms
Lead Channel Sensing Intrinsic Amplitude: 0.5 mV
Lead Channel Sensing Intrinsic Amplitude: 17.5 mV
Lead Channel Setting Pacing Amplitude: 2 V
Lead Channel Setting Pacing Amplitude: 2.25 V
Lead Channel Setting Pacing Amplitude: 2.5 V
Lead Channel Setting Pacing Pulse Width: 0.4 ms
Lead Channel Setting Sensing Sensitivity: 0.9 mV
Zone Setting Status: 755011

## 2022-10-26 ENCOUNTER — Ambulatory Visit (HOSPITAL_COMMUNITY)
Admission: RE | Admit: 2022-10-26 | Discharge: 2022-10-26 | Disposition: A | Discharge status: Home / Self Care | Payer: Medicare Other | Source: Ambulatory Visit | Attending: Family Medicine | Admitting: Family Medicine

## 2022-10-26 DIAGNOSIS — M25562 Pain in left knee: Secondary | ICD-10-CM | POA: Insufficient documentation

## 2022-10-26 DIAGNOSIS — G8929 Other chronic pain: Secondary | ICD-10-CM | POA: Diagnosis not present

## 2022-10-26 DIAGNOSIS — M1712 Unilateral primary osteoarthritis, left knee: Secondary | ICD-10-CM | POA: Diagnosis not present

## 2022-10-29 ENCOUNTER — Other Ambulatory Visit: Payer: Self-pay | Admitting: Family Medicine

## 2022-10-29 ENCOUNTER — Encounter: Payer: Self-pay | Admitting: Family Medicine

## 2022-11-03 NOTE — Progress Notes (Signed)
Remote pacemaker transmission.   

## 2022-11-06 NOTE — Telephone Encounter (Signed)
Please check with the patient is he taking tamsulosin currently?  Uncertain if he may have 6 months on the other prescription

## 2022-11-07 ENCOUNTER — Ambulatory Visit (INDEPENDENT_AMBULATORY_CARE_PROVIDER_SITE_OTHER): Payer: Medicare Other | Admitting: Family Medicine

## 2022-11-07 VITALS — BP 137/74 | HR 63 | Wt 221.2 lb

## 2022-11-07 DIAGNOSIS — M25562 Pain in left knee: Secondary | ICD-10-CM

## 2022-11-07 DIAGNOSIS — F4310 Post-traumatic stress disorder, unspecified: Secondary | ICD-10-CM | POA: Diagnosis not present

## 2022-11-07 DIAGNOSIS — M5430 Sciatica, unspecified side: Secondary | ICD-10-CM | POA: Diagnosis not present

## 2022-11-07 MED ORDER — ALFUZOSIN HCL ER 10 MG PO TB24: 10.0000 mg | ORAL_TABLET | Freq: Every day | ORAL | 4 refills | Status: DC

## 2022-11-07 MED ORDER — FLUOXETINE HCL 20 MG PO CAPS: 20.0000 mg | ORAL_CAPSULE | Freq: Every day | ORAL | 1 refills | Status: DC

## 2022-11-07 NOTE — Progress Notes (Signed)
   Subjective:    Patient ID: [name], male    DOB: 1956/03/31, 67 y.o.   MRN: [id number]  HPI Patient  arrives today for 3 week follow up. 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 Patient frustrated by the level of pain He also has a small nodule in the knee that he like to see orthopedics for x-ray was negative He also is dealing with depression which he feels Prozac is not really helping much but he is willing to go up on the dose he does not want to see psychiatry he denies being suicidal  Review of Systems     Objective:   Physical Exam  General-in no acute distress Eyes-no discharge Lungs-respiratory rate normal, CTA CV-no murmurs,RRR Extremities skin warm dry no edema Neuro grossly normal Behavior normal, alert Increased pain with straight leg raise left side strength in the leg is good      Assessment & Plan:  1. Left knee pain, unspecified chronicity Referral to orthopedics for this small nodule to see if they will remove it - Ambulatory referral to Orthopedics  2. Sciatic nerve pain, unspecified laterality Referral to interventional pain specialist May use Dilaudid as needed but not frequently patient would benefit from more thorough evaluation and possible interventions - Ambulatory referral to Pain Clinic  3. PTSD (post-traumatic stress disorder) Increase Prozac from 10 mg to 20 mg patient not suicidal patient was talked to that if he becomes suicidal or worse immediately reach out to [place] or others  Follow-up with [place] in 6 weeks give [place] update in 3 weeks

## 2022-11-08 NOTE — Telephone Encounter (Signed)
I saw the patient the other day he is no longer on tamsulosin please discontinue this from his med list and you may refuse this you may give 6 months refill on the other medicine thank you

## 2022-11-15 ENCOUNTER — Other Ambulatory Visit (INDEPENDENT_AMBULATORY_CARE_PROVIDER_SITE_OTHER): Payer: Medicare Other

## 2022-11-15 ENCOUNTER — Ambulatory Visit: Payer: Medicare Other | Admitting: Orthopedic Surgery

## 2022-11-15 DIAGNOSIS — G8929 Other chronic pain: Secondary | ICD-10-CM | POA: Diagnosis not present

## 2022-11-15 DIAGNOSIS — M25562 Pain in left knee: Secondary | ICD-10-CM

## 2022-11-16 ENCOUNTER — Other Ambulatory Visit: Payer: Self-pay

## 2022-11-17 ENCOUNTER — Encounter: Payer: Self-pay | Admitting: Orthopedic Surgery

## 2022-11-17 NOTE — Progress Notes (Signed)
Office Visit Note   Patient: [name]           Date of Birth: 15-Jul-1955           MRN: [id number] Visit Date: 11/15/2022 Requested by: Babs Sciara, MD [address] [place],  [place] [address] PCP: Babs Sciara, MD  Subjective: Chief Complaint  Patient presents with   Left Knee - Pain    HPI: [name] is a 67 y.o. male who presents to the office reporting small left knee mass.  That has been going on since knee arthroscopy about 5 years ago.  He cannot recall who did the knee arthroscopy.  The knee itself is not painful but this small bump is painful.  Size remains unchanged.  Is about the size of a garden pea.  Reports increased pain with pressure on this region.  Pain does not wake him from sleep at night.  Patient is on Dilaudid for other issues.  Denies any mechanical symptoms in the knee..                ROS: All systems reviewed are negative as they relate to the chief complaint within the history of present illness.  Patient denies fevers or chills.  Assessment & Plan: Visit Diagnoses:  1. Chronic pain of left knee     Plan: Impression is possible epidermal inclusion cyst around the left knee.  Ultrasound examination does demonstrate a well-defined cystic mass in the region in question.  No calcification present.  Does not appear to be a meniscal cyst.  Plan at this time is mass excision.  Risk and benefits are discussed with the patient include not limited to infection nerve and vessel damage incomplete pain relief as well as possible recurrence.  Anticipate relatively quick recovery.  We will send the specimen to pathology.  All questions answered.  Follow-Up Instructions: No follow-ups on file.   Orders:  Orders Placed This Encounter  Procedures   XR Knee Complete 4 Views Left   No orders of the defined types were placed in this encounter.     Procedures: No procedures performed   Clinical Data: No additional  findings.  Objective: Vital Signs: There were no vitals taken for this visit.  Physical Exam:  Constitutional: Patient appears well-developed HEENT:  Head: Normocephalic Eyes:EOM are normal Neck: Normal range of motion Cardiovascular: Normal rate Pulmonary/chest: Effort normal Neurologic: Patient is alert Skin: Skin is warm Psychiatric: Patient has normal mood and affect  Ortho Exam: Ortho exam demonstrates full range of motion of the left knee.  Collateral cruciate ligaments are stable.  Extensor mechanism intact.  No joint line tenderness is present.  Does have about a garden pea size mass in the vicinity but not directly on the prior arthroscopy incision on that medial side.  Is tender to palpation but with negative Tinel's.  Ultrasound examination does demonstrate enclosed cystic mass with no calcification in the subcutaneous tissue.  Specialty Comments:  No specialty comments available.  Imaging: No results found.   PMFS History: Patient Active Problem List   Diagnosis Date Noted   Acute bronchitis 04/17/2022   Upper airway cough syndrome 12/14/2020   Aortic atherosclerosis (HCC) 05/27/2020   Stable angina pectoris    Hyperlipidemia 01/04/2016   Acquired complete AV block (HCC) 04/02/2014   OSA (obstructive sleep apnea) 10/16/2013   BPH (benign prostatic hyperplasia) 08/25/2013   Coronary artery disease involving native coronary artery of native heart with angina  pectoris (HCC) 10/18/2012   S/P drug eluting coronary stent placement 10/18/2012   DOE (dyspnea on exertion) 12/26/2011   Hypertension    GERD (gastroesophageal reflux disease) 12/20/2011   Hx of adenomatous colonic polyps 12/20/2011   Past Medical History:  Diagnosis Date   ADD (attention deficit disorder)    Rx-Adderall   Anginal pain (HCC)    Anxiety    on meds   Aortic atherosclerosis (HCC) 05/27/2020   Seen on x-ray being treated with statin   AV block, 3rd degree (HCC)    Colon polyps 2011    tubular adenoma by excisional biopsy during colonoscopy   Complete heart block (HCC)    a. s/p PPM Placement in 12/2012 with Medtronic CRT-P placement in 03/2014   Coronary artery disease    a. s/p PCI of the RCA in 2014 b. 08/2018: patent RCA stent with new mid-LAD 85% stenosis (treated with PCI/DES) and 85% distal OM2 stenosis (med management recommended)   Degenerative disc disease, cervical    Depression    PTSD   DM (diabetes mellitus) (HCC)    on meds   Gastrointestinal bleeding 10/15/2017   GERD (gastroesophageal reflux disease)    Hyperlipidemia    on meds   Hypertension    on meds   Pacemaker    Palpitations 2003   Holter in 2003: PACs and PVCs; negative stress nuclear test in 2005; right bundle branch block; echo in 2008-mild LVH, otherwise normal; 2008-negative stress nuclear test.   Pneumonia    Prostatitis    prostate calcifications by CT   Seasonal allergies    Sleep apnea    questionable diagnosis    Family History  Problem Relation Age of Onset   Heart disease Mother    Hypertension Mother        Carotid disease   Prostate cancer Brother 66   Breast cancer Maternal Aunt    Colon cancer Neg Hx    Colon polyps Neg Hx    Rectal cancer Neg Hx    Stomach cancer Neg Hx    Esophageal cancer Neg Hx     Past Surgical History:  Procedure Laterality Date   ANTERIOR CERVICAL DECOMP/DISCECTOMY FUSION     BACK SURGERY     Biventricular pacemaker upgrade/ system revision  04/02/2014   removal of previous atrial and ventricular leads with placement of a new MDT Consulta CRT-P system at [facility] by Dr Lorenza Cambridge   COLONOSCOPY  2017   MS-MAC-suprep(good)-leiomyoma/TA   COLONOSCOPY W/ POLYPECTOMY  2011   tubular adenoma   CORONARY BALLOON ANGIOPLASTY N/A 06/15/2020   Procedure: CORONARY BALLOON ANGIOPLASTY;  Surgeon: Corky Crafts, MD;  Location: [facility] INVASIVE CV LAB;  Service: Cardiovascular;  Laterality: N/A;   CORONARY STENT INTERVENTION N/A  08/22/2018   Procedure: CORONARY STENT INTERVENTION;  Surgeon: Lennette Bihari, MD;  Location: [facility] INVASIVE CV LAB;  Service: Cardiovascular;  Laterality: N/A;   CORONARY ULTRASOUND/IVUS N/A 06/15/2020   Procedure: Intravascular Ultrasound/IVUS;  Surgeon: Corky Crafts, MD;  Location: [facility] INVASIVE CV LAB;  Service: Cardiovascular;  Laterality: N/A;   ESOPHAGOGASTRODUODENOSCOPY     Gastritis   INSERT / REPLACE / REMOVE PACEMAKER  12/17/2012   MDT Adapta L implanted by Dr Johney Frame for mobitz II second degree AV block   KNEE SURGERY Right    LEFT HEART CATH AND CORONARY ANGIOGRAPHY N/A 08/22/2018   Procedure: LEFT HEART CATH AND CORONARY ANGIOGRAPHY;  Surgeon: Lennette Bihari, MD;  Location: [facility] INVASIVE CV  LAB;  Service: Cardiovascular;  Laterality: N/A;   LEFT HEART CATH AND CORONARY ANGIOGRAPHY N/A 06/15/2020   Procedure: LEFT HEART CATH AND CORONARY ANGIOGRAPHY;  Surgeon: Corky Crafts, MD;  Location: [facility] INVASIVE CV LAB;  Service: Cardiovascular;  Laterality: N/A;   LEFT HEART CATH AND CORONARY ANGIOGRAPHY N/A 09/01/2021   Procedure: LEFT HEART CATH AND CORONARY ANGIOGRAPHY;  Surgeon: Lennette Bihari, MD;  Location: [facility] INVASIVE CV LAB;  Service: Cardiovascular;  Laterality: N/A;   LEFT HEART CATHETERIZATION WITH CORONARY ANGIOGRAM N/A 10/17/2012   Procedure: LEFT HEART CATHETERIZATION WITH CORONARY ANGIOGRAM;  Surgeon: Wendall Stade, MD;  Location: [facility] CATH LAB;  Service: Cardiovascular;  Laterality: N/A;   LEFT HEART CATHETERIZATION WITH CORONARY ANGIOGRAM N/A 12/17/2012   Procedure: LEFT HEART CATHETERIZATION WITH CORONARY ANGIOGRAM;  Surgeon: Peter M Swaziland, MD;  Location: [facility] CATH LAB;  Service: Cardiovascular;  Laterality: N/A;   LEFT HEART CATHETERIZATION WITH CORONARY ANGIOGRAM N/A 06/11/2013   Procedure: LEFT HEART CATHETERIZATION WITH CORONARY ANGIOGRAM;  Surgeon: Iran Ouch, MD;  Location: [facility] CATH LAB;  Service: Cardiovascular;  Laterality: N/A;   PERCUTANEOUS CORONARY  STENT INTERVENTION (PCI-S)  10/17/2012   Procedure: PERCUTANEOUS CORONARY STENT INTERVENTION (PCI-S);  Surgeon: Wendall Stade, MD;  Location: [facility] CATH LAB;  Service: Cardiovascular;;   PERMANENT PACEMAKER INSERTION N/A 12/17/2012   Procedure: PERMANENT PACEMAKER INSERTION;  Surgeon: Hillis Range, MD;  Location: [facility] CATH LAB;  Service: Cardiovascular;  Laterality: N/A;   POLYPECTOMY  2017   TA   UPPER GASTROINTESTINAL ENDOSCOPY     Social History   Occupational History   Not on file  Tobacco Use   Smoking status: Former    Current packs/day: 0.00    Average packs/day: 2.5 packs/day for 40.0 years (99.9 ttl pk-yrs)    Types: Cigarettes    Start date: 04/18/1967    Quit date: 12/20/2003    Years since quitting: 18.9   Smokeless tobacco: Former  Building services engineer status: Former  Substance and Sexual Activity   Alcohol use: Yes    Alcohol/week: 7.0 - 14.0 standard drinks of alcohol    Types: 7 - 14 Shots of liquor per week    Comment: rare   Drug use: Not Currently    Types: Marijuana    Comment: 30 + years ago - "crank, marjiuanna, ect."   Sexual activity: Yes    Birth control/protection: Surgical

## 2022-11-20 DIAGNOSIS — F4312 Post-traumatic stress disorder, chronic: Secondary | ICD-10-CM | POA: Diagnosis not present

## 2022-11-23 ENCOUNTER — Ambulatory Visit: Payer: Medicare Other | Admitting: Cardiology

## 2022-11-23 NOTE — Progress Notes (Deleted)
Clinical Summary Mr. Christodoulou is a 67 y.o.male  seen today for a focused visit for symptoms of chest pain and palpitations.    1. CAD - history of DES to LAD in 08/2018, PCI to RCA in 2014   06/2019 echo: LVEF 60-65%, no WMAs, grade I DDx,  - 09/2019 nuclear stress: small apical infarct with mild peri-infarct ischemia   06/2020 cath: prox LAD 50%, patent LAD stent, LCX 85% distal, RCA 75% with stent ISR. Had PTCA of RCA ISR. From interventional could hold plavix after 30 days if needed       - last visit some fatigue, dizziness, SOB. We lowered his hydralazine to 25mg  bid and lowered imdur to 120mg  daily.      08/2021 cath: prox LAD 50%, LCX 30%, RCA patent stents. LVEDP 11 -ranexa stopped by EP due to PVC burden.    03/2022 echo: LVEF 60-65%, indet diastolic, no significant valve disease.    - nonspecific chest pains at times. Some DOE at times, sedentary lifestyle - just bought a treadmill       2.Palpitations - feeling of skipping heart beat, associated with pressure in chest and into neck. - increase in frequency recently.  - rare caffeine, rare EtOH   07/2021 minotor; frequent PACs (9.1%), rare PVCs. 6 beat run of NSVT.    - followed by Dr Nelly Laurence. Due to PVCs ranexa was stopped and symptoms much improved     - 07/2022 due to frequent symptomatic PVCs on device check nadolol changed to propranolol by Dr Nelly Laurence and started on amiodarone.   -seen at [facility]. 10/2022 monitor with rare ectopy   3. HTN - has not taken meds today     3. Complete heart block - pacemaker followed by Dr Johney Frame, CRT-P - normal device check Jan 2023   Jan 2024 normal device check.    4. Hyperlipidemia    06/2020 TC 119 HDL 52 TG 113 LDL 47  02/2021 TC 132 TG 73 HDL 58 LDL 59  - reports significant diffuse muscle aches in legs often at night.    - had tried lowering crestor to 10mg  daily.  - symptoms better but not resolved.    5. Orthostastic hypotension - no recent symptoms -  working to stay well hydrated.      SH; just sold his house, was able to get a great price and only on market for a week. Living in a fifth wheel for the time being.  Recently bought new home.  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 s/p PPM Placement in 12/2012 with Medtronic CRT-P placement in 03/2014   Coronary artery disease    a. s/p PCI of the RCA in 2014 b. 08/2018: patent RCA stent with new mid-LAD 85% stenosis (treated with PCI/DES) and 85% distal OM2 stenosis (med management recommended)   Degenerative disc disease, cervical    Depression    PTSD   DM (diabetes mellitus) (HCC)    on meds   Gastrointestinal bleeding 10/15/2017   GERD (gastroesophageal  reflux disease)    Hyperlipidemia    on meds   Hypertension    on meds   Pacemaker    Palpitations 2003   Holter in 2003: PACs and PVCs; negative stress nuclear test in 2005; right bundle  block; echo in 2008-mild LVH, otherwise normal; 2008-negative stress nuclear test.   Pneumonia    Prostatitis    prostate calcifications by CT   Seasonal allergies    Sleep apnea    questionable diagnosis     Allergies  Allergen Reactions   Morphine And Codeine Itching and Rash    redness   Lasix [Furosemide]     Chest pain and tightness   Latex Rash     Current Outpatient Medications  Medication Sig Dispense Refill   acetaminophen (TYLENOL) 500 MG tablet Take 1,500 mg by mouth daily as needed for moderate pain.     albuterol (VENTOLIN HFA) 108 (90 Base) MCG/ACT inhaler TAKE 2 PUFFS BY MOUTH EVERY 6 HOURS AS NEEDED FOR WHEEZE OR SHORTNESS OF BREATH 18 each 1    alfuzosin (UROXATRAL) 10 MG 24 hr tablet Take 1 tablet (10 mg total) by mouth daily with breakfast. 30 tablet 4   ALPRAZolam (XANAX) 0.5 MG tablet 1/2-1 twice daily as needed home use only use sparingly 20 tablet 0   amLODipine (NORVASC) 5 MG tablet Take 1 tablet (5 mg total) by mouth daily. 90 tablet 1   aspirin EC 81 MG tablet Take 81 mg by mouth daily. Swallow whole.     FLUoxetine (PROZAC) 20 MG capsule Take 1 capsule (20 mg total) by mouth daily. 90 capsule 1   HYDROmorphone (DILAUDID) 4 MG tablet 1/2 to 1 q 6 hours prn severe pain 20 tablet 0   isosorbide mononitrate (IMDUR) 120 MG 24 hr tablet TAKE 1 TABLET(120 MG) BY MOUTH DAILY 90 tablet 2   losartan (COZAAR) 100 MG tablet TAKE 1 TABLET(100 MG) BY MOUTH DAILY 90 tablet 1   metFORMIN (GLUCOPHAGE) 500 MG tablet Take 1 tablet (500 mg total) by mouth 2 (two) times daily. 180 tablet 1   nitroGLYCERIN (NITROSTAT) 0.4 MG SL tablet Place 1 tablet (0.4 mg total) under the tongue every 5 (five) minutes x 3 doses as needed for chest pain (if no relief after 3rd dose, proceed to ED for an evaluation). 25 tablet 3   ondansetron (ZOFRAN) 8 MG tablet Take 1 tablet (8 mg total) by mouth every 8 (eight) hours as needed for nausea. 15 tablet 3   propranolol ER (INDERAL LA) 160 MG SR capsule Take 1 capsule (160 mg total) by mouth daily. 90 capsule 3   rosuvastatin (CRESTOR) 10 MG tablet Take 1 tablet (10 mg total) by mouth daily. 90 tablet 1   Current Facility-Administered Medications  Medication Dose Route Frequency Provider Last Rate Last Admin   sodium chloride flush (NS) 0.9 % injection 3 mL  3 mL Intravenous Q12H , Dorothe Pea, MD         Past Surgical History:  Procedure Laterality Date   ANTERIOR CERVICAL DECOMP/DISCECTOMY FUSION     BACK SURGERY     Biventricular pacemaker upgrade/ system revision  04/02/2014   removal of previous atrial and ventricular leads with placement of a new MDT Consulta CRT-P system at [facility] by  Dr [facility]   COLONOSCOPY  2017   MS-MAC-suprep(good)-leiomyoma/TA   COLONOSCOPY W/ POLYPECTOMY  2011   tubular adenoma   CORONARY BALLOON ANGIOPLASTY N/A 06/15/2020   Procedure: CORONARY BALLOON ANGIOPLASTY;  Surgeon: Corky Crafts, MD;  Location: [facility] INVASIVE CV LAB;  Service: Cardiovascular;  Laterality: N/A;   CORONARY STENT INTERVENTION N/A 08/22/2018   Procedure: CORONARY STENT INTERVENTION;  Surgeon: Lennette Bihari, MD;  Location: [facility] INVASIVE CV LAB;  Service: Cardiovascular;  Laterality: N/A;   CORONARY ULTRASOUND/IVUS N/A 06/15/2020   Procedure: Intravascular Ultrasound/IVUS;  Surgeon: Corky Crafts, MD;  Location: [facility] INVASIVE CV LAB;  Service: Cardiovascular;  Laterality: N/A;   ESOPHAGOGASTRODUODENOSCOPY     Gastritis   INSERT / REPLACE / REMOVE PACEMAKER  12/17/2012   MDT Adapta L implanted by Dr Johney Frame for mobitz II second degree AV block   KNEE SURGERY Right    LEFT HEART CATH AND CORONARY ANGIOGRAPHY N/A 08/22/2018   Procedure: LEFT HEART CATH AND CORONARY ANGIOGRAPHY;  Surgeon: Lennette Bihari, MD;  Location: [facility] INVASIVE CV LAB;  Service: Cardiovascular;  Laterality: N/A;   LEFT HEART CATH AND CORONARY ANGIOGRAPHY N/A 06/15/2020   Procedure: LEFT HEART CATH AND CORONARY ANGIOGRAPHY;  Surgeon: Corky Crafts, MD;  Location: [facility] INVASIVE CV LAB;  Service: Cardiovascular;  Laterality: N/A;   LEFT HEART CATH AND CORONARY ANGIOGRAPHY N/A 09/01/2021   Procedure: LEFT HEART CATH AND CORONARY ANGIOGRAPHY;  Surgeon: Lennette Bihari, MD;  Location: [facility] INVASIVE CV LAB;  Service: Cardiovascular;  Laterality: N/A;   LEFT HEART CATHETERIZATION WITH CORONARY ANGIOGRAM N/A 10/17/2012   Procedure: LEFT HEART CATHETERIZATION WITH CORONARY ANGIOGRAM;  Surgeon: Wendall Stade, MD;  Location: [facility] CATH LAB;  Service: Cardiovascular;  Laterality: N/A;   LEFT HEART CATHETERIZATION WITH CORONARY ANGIOGRAM N/A 12/17/2012   Procedure: LEFT HEART CATHETERIZATION WITH CORONARY  ANGIOGRAM;  Surgeon: Peter M Swaziland, MD;  Location: [facility] CATH LAB;  Service: Cardiovascular;  Laterality: N/A;   LEFT HEART CATHETERIZATION WITH CORONARY ANGIOGRAM N/A 06/11/2013   Procedure: LEFT HEART CATHETERIZATION WITH CORONARY ANGIOGRAM;  Surgeon: Iran Ouch, MD;  Location: [facility] CATH LAB;  Service: Cardiovascular;  Laterality: N/A;   PERCUTANEOUS CORONARY STENT INTERVENTION (PCI-S)  10/17/2012   Procedure: PERCUTANEOUS CORONARY STENT INTERVENTION (PCI-S);  Surgeon: Wendall Stade, MD;  Location: [facility] CATH LAB;  Service: Cardiovascular;;   PERMANENT PACEMAKER INSERTION N/A 12/17/2012   Procedure: PERMANENT PACEMAKER INSERTION;  Surgeon: Hillis Range, MD;  Location: [facility] CATH LAB;  Service: Cardiovascular;  Laterality: N/A;   POLYPECTOMY  2017   TA   UPPER GASTROINTESTINAL ENDOSCOPY       Allergies  Allergen Reactions   Morphine And Codeine Itching and Rash    redness   Lasix [Furosemide]     Chest pain and tightness   Latex Rash      Family History  Problem Relation Age of Onset   Heart disease Mother    Hypertension Mother        Carotid disease   Prostate cancer Brother 86   Breast cancer Maternal Aunt    Colon cancer Neg Hx    Colon polyps Neg Hx    Rectal cancer Neg Hx    Stomach cancer Neg Hx    Esophageal cancer Neg Hx      Social History Mr. Kallstrom reports that he quit smoking about 18 years ago. His smoking use included cigarettes. He started smoking about 55 years ago. He has a 99.9 pack-year smoking history. He has quit using smokeless tobacco. Mr. Zelada reports current alcohol use of about 7.0 - 14.0 standard drinks of alcohol per week.   Review of Systems CONSTITUTIONAL: No weight loss, fever, chills, weakness or  fatigue.  HEENT: Eyes: No visual loss, blurred vision, double vision or yellow sclerae.No hearing loss, sneezing, congestion, runny nose or sore throat.  SKIN: No rash or itching.  CARDIOVASCULAR:  RESPIRATORY: No shortness of breath, cough or  sputum.  GASTROINTESTINAL: No anorexia, nausea, vomiting or diarrhea. No abdominal pain or blood.  GENITOURINARY: No burning on urination, no polyuria NEUROLOGICAL: No headache, dizziness, syncope, paralysis, ataxia, numbness or tingling in the extremities. No change in bowel or bladder control.  MUSCULOSKELETAL: No muscle, back pain, joint pain or stiffness.  LYMPHATICS: No enlarged nodes. No history of splenectomy.  PSYCHIATRIC: No history of depression or anxiety.  ENDOCRINOLOGIC: No reports of sweating, cold or heat intolerance. No polyuria or polydipsia.  Marland Kitchen   Physical Examination There were no vitals filed for this visit. There were no vitals filed for this visit.  Gen: resting comfortably, no acute distress HEENT: no scleral icterus, pupils equal round and reactive, no palptable cervical adenopathy,  CV Resp: Clear to auscultation bilaterally GI: abdomen is soft, non-tender, non-distended, normal bowel sounds, no hepatosplenomegaly MSK: extremities are warm, no edema.  Skin: warm, no rash Neuro:  no focal deficits Psych: appropriate affect   Diagnostic Studies   08/2021 cath  Prox LAD lesion is 50% stenosed.   Prox Cx lesion is 10% stenosed.   Ost Cx lesion is 10% stenosed.   Dist Cx lesion is 30% stenosed.   Mid RCA lesion is 5% stenosed.   Non-stenotic Mid LAD lesion was previously treated.   The left ventricular systolic function is normal.   LV end diastolic pressure is normal.   The left ventricular ejection fraction is greater than 65% by visual estimate.   Mild -moderate CAD with previously noted smooth 50% LAD stenosis after the first diagonal and septal perforating artery with a widely patent mid LAD stent; mild nonobstructive left circumflex stenosis in a co-dominant vessel; and superior takeoff RCA with smooth very mild 5% intimal hyperplasia within the mid RCA stent.   Hyperdynamic LV function with EF estimated at least 65%.  LVEDP 11 mmHg.    RECOMMENDATION: Medical therapy.    Assessment and Plan  1.CAD with unspecified angina -recent cath as reported above - no specific cardiac symptoms, continue current meds   2. Palpitations - improved since stopping ranexa - continue to monitor   3. Hyperlipidemia - diffuse leg pains at night unclear if statin related, did improve with lowering crestor to 10mg  daily - continue current statin, f/u labs   4. HTN - elevated here but has not taken meds yet today, will update [place] Monday on home bp's      Antoine Poche, M.D., F.A.C.C.

## 2022-12-05 ENCOUNTER — Ambulatory Visit: Payer: Medicare Other | Admitting: Student in an Organized Health Care Education/Training Program

## 2022-12-07 ENCOUNTER — Telehealth: Payer: Self-pay | Admitting: Orthopedic Surgery

## 2022-12-07 NOTE — Telephone Encounter (Signed)
Please see note below and advise ASAP:     Hello   Patient needs to be advised regarding Aspirin (He takes 81 mg daily)  [name]) (Sex: M) Sex: Male  DOB: Feb 07, 1956  Age: 67 Procedure: Left Knee Scope on 12/11/2022 Physician: Rise Paganini   Thank you!    Collene Mares, RN (Pre-Op Assessment) [facility] [address] [place], [place] [address] [phone] Ext. [phone]

## 2022-12-08 NOTE — Telephone Encounter (Signed)
I called local ok thx

## 2022-12-08 NOTE — Telephone Encounter (Signed)
Okay to stop aspirin today and then resume it on the night of surgery.  Thanks  -fairly small procedure so should not be a big problem.

## 2022-12-11 ENCOUNTER — Other Ambulatory Visit: Payer: Self-pay

## 2022-12-11 DIAGNOSIS — R2242 Localized swelling, mass and lump, left lower limb: Secondary | ICD-10-CM | POA: Diagnosis not present

## 2022-12-11 DIAGNOSIS — D2122 Benign neoplasm of connective and other soft tissue of left lower limb, including hip: Secondary | ICD-10-CM | POA: Diagnosis not present

## 2022-12-20 ENCOUNTER — Ambulatory Visit (INDEPENDENT_AMBULATORY_CARE_PROVIDER_SITE_OTHER): Payer: Medicare Other | Admitting: Surgical

## 2022-12-20 ENCOUNTER — Encounter: Payer: Self-pay | Admitting: Surgical

## 2022-12-20 DIAGNOSIS — Z9889 Other specified postprocedural states: Secondary | ICD-10-CM

## 2022-12-22 ENCOUNTER — Encounter: Payer: Self-pay | Admitting: Surgical

## 2022-12-22 NOTE — Progress Notes (Signed)
Post-Op Visit Note   Patient: [name]           Date of Birth: May 23, 1955           MRN: [id number] Visit Date: 12/20/2022 PCP: Babs Sciara, MD   Assessment & Plan:  Chief Complaint:  Chief Complaint  Patient presents with   Left Knee - Follow-up    Mass excision 12/11/2022   Visit Diagnoses:  1. S/P left knee surgery     Plan: The patient is a 67 year old male who presents s/p left knee mass excision on 12/11/2022.  He is about 9 days postop.  Doing well with no complaints.  No chest pain/shortness of breath/calf pain/fever/chills.  Not taking any pain medication.  Incision looks to be healing well with no evidence of infection or dehiscence.  Sutures were removed and replaced with Steri-Strips today.  No calf tenderness.  Negative Homans' sign.  He has full active and passive range of motion of his left knee without difficulty.  He is able to perform straight leg raise.  There is no residual mass that is palpable in the surgical site.  Plan is to avoid kneeling until 4 to 6 weeks out from surgery.  Sutures removed and replaced with Steri-Strips.  He will leave these on and avoid submersion underwater in a bath, pool, hot tub, etc. for about 4 weeks.  Follow-up as needed if symptoms return or if he notices any difficulties with his incision.  Pathology report did demonstrate benign angioleiomyoma.  No need for any follow-up.  Follow-Up Instructions: No follow-ups on file.   Orders:  No orders of the defined types were placed in this encounter.  No orders of the defined types were placed in this encounter.   Imaging: No results found.  PMFS History: Patient Active Problem List   Diagnosis Date Noted   Acute bronchitis 04/17/2022   Upper airway cough syndrome 12/14/2020   Aortic atherosclerosis (HCC) 05/27/2020   Stable angina pectoris    Hyperlipidemia 01/04/2016   Acquired complete AV block (HCC) 04/02/2014   OSA (obstructive sleep apnea) 10/16/2013   BPH  (benign prostatic hyperplasia) 08/25/2013   Coronary artery disease involving native coronary artery of native heart with angina pectoris (HCC) 10/18/2012   S/P drug eluting coronary stent placement 10/18/2012   DOE (dyspnea on exertion) 12/26/2011   Hypertension    GERD (gastroesophageal reflux disease) 12/20/2011   Hx of adenomatous colonic polyps 12/20/2011   Past Medical History:  Diagnosis Date   ADD (attention deficit disorder)    Rx-Adderall   Anginal pain (HCC)    Anxiety    on meds   Aortic atherosclerosis (HCC) 05/27/2020   Seen on x-ray being treated with statin   AV block, 3rd degree (HCC)    Colon polyps 2011   tubular adenoma by excisional biopsy during colonoscopy   Complete heart block (HCC)    a. s/p PPM Placement in 12/2012 with Medtronic CRT-P placement in 03/2014   Coronary artery disease    a. s/p PCI of the RCA in 2014 b. 08/2018: patent RCA stent with new mid-LAD 85% stenosis (treated with PCI/DES) and 85% distal OM2 stenosis (med management recommended)   Degenerative disc disease, cervical    Depression    PTSD   DM (diabetes mellitus) (HCC)    on meds   Gastrointestinal bleeding 10/15/2017   GERD (gastroesophageal reflux disease)    Hyperlipidemia    on meds   Hypertension  on meds   Pacemaker    Palpitations 2003   Holter in 2003: PACs and PVCs; negative stress nuclear test in 2005; right bundle branch block; echo in 2008-mild LVH, otherwise normal; 2008-negative stress nuclear test.   Pneumonia    Prostatitis    prostate calcifications by CT   Seasonal allergies    Sleep apnea    questionable diagnosis    Family History  Problem Relation Age of Onset   Heart disease Mother    Hypertension Mother        Carotid disease   Prostate cancer Brother 86   Breast cancer Maternal Aunt    Colon cancer Neg Hx    Colon polyps Neg Hx    Rectal cancer Neg Hx    Stomach cancer Neg Hx    Esophageal cancer Neg Hx     Past Surgical History:   Procedure Laterality Date   ANTERIOR CERVICAL DECOMP/DISCECTOMY FUSION     BACK SURGERY     Biventricular pacemaker upgrade/ system revision  04/02/2014   removal of previous atrial and ventricular leads with placement of a new MDT Consulta CRT-P system at [facility] by Dr [facility]   COLONOSCOPY  2017   MS-MAC-suprep(good)-leiomyoma/TA   COLONOSCOPY W/ POLYPECTOMY  2011   tubular adenoma   CORONARY BALLOON ANGIOPLASTY N/A 06/15/2020   Procedure: CORONARY BALLOON ANGIOPLASTY;  Surgeon: Corky Crafts, MD;  Location: [facility] INVASIVE CV LAB;  Service: Cardiovascular;  Laterality: N/A;   CORONARY STENT INTERVENTION N/A 08/22/2018   Procedure: CORONARY STENT INTERVENTION;  Surgeon: Lennette Bihari, MD;  Location: [facility] INVASIVE CV LAB;  Service: Cardiovascular;  Laterality: N/A;   CORONARY ULTRASOUND/IVUS N/A 06/15/2020   Procedure: Intravascular Ultrasound/IVUS;  Surgeon: Corky Crafts, MD;  Location: [facility] INVASIVE CV LAB;  Service: Cardiovascular;  Laterality: N/A;   ESOPHAGOGASTRODUODENOSCOPY     Gastritis   INSERT / REPLACE / REMOVE PACEMAKER  12/17/2012   MDT Adapta L implanted by Dr Johney Frame for mobitz II second degree AV block   KNEE SURGERY Right    LEFT HEART CATH AND CORONARY ANGIOGRAPHY N/A 08/22/2018   Procedure: LEFT HEART CATH AND CORONARY ANGIOGRAPHY;  Surgeon: Lennette Bihari, MD;  Location: [facility] INVASIVE CV LAB;  Service: Cardiovascular;  Laterality: N/A;   LEFT HEART CATH AND CORONARY ANGIOGRAPHY N/A 06/15/2020   Procedure: LEFT HEART CATH AND CORONARY ANGIOGRAPHY;  Surgeon: Corky Crafts, MD;  Location: [facility] INVASIVE CV LAB;  Service: Cardiovascular;  Laterality: N/A;   LEFT HEART CATH AND CORONARY ANGIOGRAPHY N/A 09/01/2021   Procedure: LEFT HEART CATH AND CORONARY ANGIOGRAPHY;  Surgeon: Lennette Bihari, MD;  Location: [facility] INVASIVE CV LAB;  Service: Cardiovascular;  Laterality: N/A;   LEFT HEART CATHETERIZATION WITH CORONARY ANGIOGRAM N/A 10/17/2012    Procedure: LEFT HEART CATHETERIZATION WITH CORONARY ANGIOGRAM;  Surgeon: Wendall Stade, MD;  Location: [facility] CATH LAB;  Service: Cardiovascular;  Laterality: N/A;   LEFT HEART CATHETERIZATION WITH CORONARY ANGIOGRAM N/A 12/17/2012   Procedure: LEFT HEART CATHETERIZATION WITH CORONARY ANGIOGRAM;  Surgeon: Peter M Swaziland, MD;  Location: [facility] CATH LAB;  Service: Cardiovascular;  Laterality: N/A;   LEFT HEART CATHETERIZATION WITH CORONARY ANGIOGRAM N/A 06/11/2013   Procedure: LEFT HEART CATHETERIZATION WITH CORONARY ANGIOGRAM;  Surgeon: Iran Ouch, MD;  Location: [facility] CATH LAB;  Service: Cardiovascular;  Laterality: N/A;   PERCUTANEOUS CORONARY STENT INTERVENTION (PCI-S)  10/17/2012   Procedure: PERCUTANEOUS CORONARY STENT INTERVENTION (PCI-S);  Surgeon: Wendall Stade, MD;  Location: [facility] CATH  LAB;  Service: Cardiovascular;;   PERMANENT PACEMAKER INSERTION N/A 12/17/2012   Procedure: PERMANENT PACEMAKER INSERTION;  Surgeon: Hillis Range, MD;  Location: [facility] CATH LAB;  Service: Cardiovascular;  Laterality: N/A;   POLYPECTOMY  2017   TA   UPPER GASTROINTESTINAL ENDOSCOPY     Social History   Occupational History   Not on file  Tobacco Use   Smoking status: Former    Current packs/day: 0.00    Average packs/day: 2.5 packs/day for 40.0 years (99.9 ttl pk-yrs)    Types: Cigarettes    Start date: 04/18/1967    Quit date: 12/20/2003    Years since quitting: 19.0   Smokeless tobacco: Former  Building services engineer status: Former  Substance and Sexual Activity   Alcohol use: Yes    Alcohol/week: 7.0 - 14.0 standard drinks of alcohol    Types: 7 - 14 Shots of liquor per week    Comment: rare   Drug use: Not Currently    Types: Marijuana    Comment: 30 + years ago - "crank, marjiuanna, ect."   Sexual activity: Yes    Birth control/protection: Surgical

## 2022-12-24 ENCOUNTER — Other Ambulatory Visit: Payer: Self-pay | Admitting: Cardiology

## 2022-12-24 ENCOUNTER — Other Ambulatory Visit: Payer: Self-pay | Admitting: Family Medicine

## 2022-12-26 ENCOUNTER — Encounter: Payer: Self-pay | Admitting: Student in an Organized Health Care Education/Training Program

## 2022-12-26 ENCOUNTER — Other Ambulatory Visit: Payer: Self-pay

## 2022-12-26 ENCOUNTER — Ambulatory Visit
Payer: Medicare Other | Attending: Student in an Organized Health Care Education/Training Program | Admitting: Student in an Organized Health Care Education/Training Program

## 2022-12-26 VITALS — BP 175/82 | HR 63 | Temp 97.5°F | Resp 18 | Ht 71.0 in | Wt 218.0 lb

## 2022-12-26 DIAGNOSIS — M5416 Radiculopathy, lumbar region: Secondary | ICD-10-CM | POA: Diagnosis not present

## 2022-12-26 DIAGNOSIS — M542 Cervicalgia: Secondary | ICD-10-CM | POA: Diagnosis not present

## 2022-12-26 DIAGNOSIS — M5412 Radiculopathy, cervical region: Secondary | ICD-10-CM | POA: Insufficient documentation

## 2022-12-26 DIAGNOSIS — G8929 Other chronic pain: Secondary | ICD-10-CM | POA: Insufficient documentation

## 2022-12-26 DIAGNOSIS — M47812 Spondylosis without myelopathy or radiculopathy, cervical region: Secondary | ICD-10-CM | POA: Diagnosis not present

## 2022-12-26 DIAGNOSIS — Z981 Arthrodesis status: Secondary | ICD-10-CM | POA: Insufficient documentation

## 2022-12-26 DIAGNOSIS — M4722 Other spondylosis with radiculopathy, cervical region: Secondary | ICD-10-CM

## 2022-12-26 DIAGNOSIS — G894 Chronic pain syndrome: Secondary | ICD-10-CM | POA: Insufficient documentation

## 2022-12-26 NOTE — Assessment & Plan Note (Signed)
Reviewed myelogram with patient which shows enlargement of the left L4 nerve root consistent with focal neurofibroma.  He also may have mild foraminal stenosis of the left L5 nerve root as well.  He has done home stretching exercises in the past.  We discussed a left L4 and L5 transforaminal ESI.

## 2022-12-26 NOTE — Assessment & Plan Note (Signed)
History of ACDF, increased cervical spine pain with radiation into left shoulder and left hand

## 2022-12-26 NOTE — Assessment & Plan Note (Signed)
General Recommendations: The pain condition that the patient suffers from is best treated with a multidisciplinary approach that involves an increase in physical activity to prevent de-conditioning and worsening of the pain cycle, as well as psychological counseling (formal and/or informal) to address the co-morbid psychological affects of pain. Treatment will often involve judicious use of  interventional procedures to decrease the pain, allowing the patient to participate in the physical activity that will ultimately produce long-lasting pain reductions. The goal of the multidisciplinary approach is to return the patient to a higher level of overall function and to restore their ability to perform activities of daily living.

## 2022-12-26 NOTE — Progress Notes (Signed)
Patient: [name]  Service Category: E/M  Provider: Edward Jolly, MD  DOB: Jun 29, 1955  DOS: 12/26/2022  Referring Provider: Babs Sciara, MD  MRN: [id number]  Setting: Ambulatory outpatient  PCP: Babs Sciara, MD  Type: New Patient  Specialty: Interventional Pain Management    Location: Office  Delivery: Face-to-face     Primary Reason(s) for Visit: Encounter for initial evaluation of one or more chronic problems (new to examiner) potentially causing chronic pain, and posing a threat to normal musculoskeletal function. (Level of risk: High) CC: Back Pain (Neck and legs more on the left side)  HPI  [name] is a 67 y.o. year old, male patient, who comes for the first time to our practice referred by Babs Sciara, MD for our initial evaluation of his chronic pain. He has GERD (gastroesophageal reflux disease); Hx of adenomatous colonic polyps; Hypertension; DOE (dyspnea on exertion); Coronary artery disease involving native coronary artery of native heart with angina pectoris (HCC); S/P drug eluting coronary stent placement; BPH (benign prostatic hyperplasia); OSA (obstructive sleep apnea); Hyperlipidemia; Stable angina pectoris; Aortic atherosclerosis (HCC); Upper airway cough syndrome; Acquired complete AV block (HCC); Acute bronchitis; Lumbar radiculopathy; Cervical spondylosis; Cervical radicular pain; S/P cervical spinal fusion; and Chronic pain syndrome on their problem list. Today he comes in for evaluation of his Back Pain (Neck and legs more on the left side)  Pain Assessment: Location: Lower Back Radiating: feet to legs more on left side. Groin, knee. Neck, shoulders Onset: More than a month ago Duration: Chronic pain Quality: Sharp, Burning, Aching Severity: 7 /10 (subjective, self-reported pain score)  Effect on ADL: in pain. Have to fight through it. Cant stand sit or lay down for periods of time. More irritating. Timing: Constant Modifying factors: fetal  position, BP: (!) 175/82  HR: 63  Onset and Duration: Sudden and Gradual Cause of pain: Unknown Severity: Getting worse Timing: Night and After activity or exercise Aggravating Factors: Bending, Kneeling, Lifiting, Motion, Prolonged sitting, Prolonged standing, Squatting, Stooping , Walking, and Walking uphill Alleviating Factors: Stretching, Cold packs, Medications, and Sleeping Associated Problems: Constipation, Depression, Erectile dysfunction, Fatigue, Inability to concentrate, Nausea, Numbness, Personality changes, Sadness, Tingling, and Weakness Quality of Pain: Aching, Burning, Intermittent, Deep, Exhausting, Feeling of weight, Getting longer, Nagging, Pulsating, Sharp, Sickening, Stabbing, and Uncomfortable Previous Examinations or Tests: CT scan, Ct-Myelogram, Nerve block, X-rays, Nerve conduction test, Neurological evaluation, Chiropractic evaluation, and Psychiatric evaluation Previous Treatments: Chiropractic manipulations, Epidural steroid injections, Narcotic medications, and Physical Therapy  [name] is being evaluated for possible interventional pain management therapies for the treatment of his chronic pain.   [name] is a very pleasant 67 year old male who presents with multiple pain generators including his low back pain with radiation into his left posterior lateral leg.  He also has a history of cervical spine pain with radiation into his left shoulder.  He has a history of cervical spine fusion.  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  He has PTSD, depression, anxiety.  He takes alprazolam as needed for anxiety and uses it very sparingly.  He utilizes approximately 3 g of Tylenol daily, 1 g every 8 hours to help manage his pain.  He also has hydromorphone which he takes 2 to 4 mg for severe breakthrough pain.  He has had epidural injections in the past with limited response.  His myelogram does show a neurofibroma along the left L4 neuroforamen.  He also has mild  foraminal narrowing of the left exiting L5  nerve root.  He tries to do home stretching exercises in the past.  He is utilizing inversion table as well.  He states that this feel position does help with his low back and radiating left leg pain.  [name] has been informed that this initial visit was an evaluation only.  On the follow up appointment I will go over the results, including ordered tests and available interventional therapies. At that time he will have the opportunity to decide whether to proceed with offered therapies or not. In the event that [name] prefers avoiding interventional options, this will conclude our involvement in the case.  Medication management recommendations may be provided upon request.  Patient informed that diagnostic tests may be ordered to assist in identifying underlying causes, narrow the list of differential diagnoses and aid in determining candidacy for (or contraindications to) planned therapeutic interventions.  Historic Controlled Substance Pharmacotherapy Review  PMP and historical list of controlled substances: Hydromorphone 2 to 4 mg as needed for breakthrough pain, utilizes very sparingly Historical Monitoring: The patient  reports that he does not currently use drugs after having used the following drugs: Marijuana. List of prior UDS Testing: No results found for: "MDMA", "COCAINSCRNUR", "PCPSCRNUR", "PCPQUANT", "CANNABQUANT", "THCU", "ETH", "CBDTHCR", "D8THCCBX", "D9THCCBX" Historical Background Evaluation: [place] PMP: PDMP reviewed during this encounter. Review of the past 74-months conducted.             [place] Department of public safety, offender search: Engineer, mining Information) Non-contributory Risk Assessment Profile: Aberrant behavior: None observed or detected today Risk factors for fatal opioid overdose: None identified today Fatal overdose hazard ratio (HR): Calculation deferred Non-fatal overdose hazard ratio (HR): Calculation deferred Risk of  opioid abuse or dependence: 0.7-3.0% with doses ? 36 MME/day and 6.1-26% with doses ? 120 MME/day. Substance use disorder (SUD) risk level: See below Personal History of Substance Abuse (SUD-Substance use disorder):  Alcohol: Positive Male or Male (Dont drink as much as I used to.)  Illegal Drugs: Positive Male or Male (last 90's.)  Rx Drugs: Negative  ORT Risk Level calculation: High Risk  Opioid Risk Tool - 12/26/22 1405       Family History of Substance Abuse   Alcohol Positive Male   dad   Illegal Drugs Positive Male   brother   Rx Drugs Negative      Personal History of Substance Abuse   Alcohol Positive Male or Male   Dont drink as much as I used to.   Illegal Drugs Positive Male or Male   last 11's.   Rx Drugs Negative      Age   Age between 16-45 years  No      History of Preadolescent Sexual Abuse   History of Preadolescent Sexual Abuse Negative or Male      Psychological Disease   Psychological Disease Positive   PTSD   ADD Positive    OCD Negative    Bipolar Negative    Schizophrenia Negative    Depression Positive      Total Score   Opioid Risk Tool Scoring 16    Opioid Risk Interpretation High Risk            ORT Scoring interpretation table:  Score <3 = Low Risk for SUD  Score between 4-7 = Moderate Risk for SUD  Score >8 = High Risk for Opioid Abuse   PHQ-2 Depression Scale:  Total score: 4  PHQ-2 Scoring interpretation table: (Score and probability of major depressive disorder)  Score  0 = No depression  Score 1 = 15.4% Probability  Score 2 = 21.1% Probability  Score 3 = 38.4% Probability  Score 4 = 45.5% Probability  Score 5 = 56.4% Probability  Score 6 = 78.6% Probability   PHQ-9 Depression Scale:  Total score: 16  PHQ-9 Scoring interpretation table:  Score 0-4 = No depression  Score 5-9 = Mild depression  Score 10-14 = Moderate depression  Score 15-19 = Moderately severe depression  Score 20-27 = Severe depression (2.4 times  higher risk of SUD and 2.89 times higher risk of overuse)   Pharmacologic Plan: No opioid analgesics.            Initial impression:  Focus on interventional pain therapy  Meds   Current Outpatient Medications:    acetaminophen (TYLENOL) 500 MG tablet, Take 1,500 mg by mouth daily as needed for moderate pain., Disp: , Rfl:    albuterol (VENTOLIN HFA) 108 (90 Base) MCG/ACT inhaler, TAKE 2 PUFFS BY MOUTH EVERY 6 HOURS AS NEEDED FOR WHEEZE OR SHORTNESS OF BREATH, Disp: 18 each, Rfl: 1   alfuzosin (UROXATRAL) 10 MG 24 hr tablet, Take 1 tablet (10 mg total) by mouth daily with breakfast., Disp: 30 tablet, Rfl: 4   ALPRAZolam (XANAX) 0.5 MG tablet, 1/2-1 twice daily as needed home use only use sparingly, Disp: 20 tablet, Rfl: 0   amLODipine (NORVASC) 5 MG tablet, Take 1 tablet (5 mg total) by mouth daily., Disp: 90 tablet, Rfl: 1   aspirin EC 81 MG tablet, Take 81 mg by mouth daily. Swallow whole., Disp: , Rfl:    FLUoxetine (PROZAC) 20 MG capsule, Take 1 capsule (20 mg total) by mouth daily., Disp: 90 capsule, Rfl: 1   HYDROmorphone (DILAUDID) 4 MG tablet, 1/2 to 1 q 6 hours prn severe pain, Disp: 20 tablet, Rfl: 0   isosorbide mononitrate (IMDUR) 120 MG 24 hr tablet, TAKE 1 TABLET(120 MG) BY MOUTH DAILY, Disp: 90 tablet, Rfl: 2   losartan (COZAAR) 100 MG tablet, TAKE 1 TABLET BY MOUTH EVERY DAY, Disp: 90 tablet, Rfl: 1   metFORMIN (GLUCOPHAGE) 500 MG tablet, Take 1 tablet (500 mg total) by mouth 2 (two) times daily., Disp: 180 tablet, Rfl: 1   nitroGLYCERIN (NITROSTAT) 0.4 MG SL tablet, Place 1 tablet (0.4 mg total) under the tongue every 5 (five) minutes x 3 doses as needed for chest pain (if no relief after 3rd dose, proceed to ED for an evaluation)., Disp: 25 tablet, Rfl: 3   ondansetron (ZOFRAN) 8 MG tablet, Take 1 tablet (8 mg total) by mouth every 8 (eight) hours as needed for nausea., Disp: 15 tablet, Rfl: 3   propranolol ER (INDERAL LA) 160 MG SR capsule, Take 1 capsule (160 mg total) by  mouth daily., Disp: 90 capsule, Rfl: 3   rosuvastatin (CRESTOR) 10 MG tablet, TAKE 1 TABLET BY MOUTH EVERY DAY, Disp: 90 tablet, Rfl: 2  Current Facility-Administered Medications:    sodium chloride flush (NS) 0.9 % injection 3 mL, 3 mL, Intravenous, Q12H, Branch, Dorothe Pea, MD  Imaging Review  Cervical Imaging: Cervical MR wo contrast: Results for orders placed in visit on 11/12/02  MR Cervical Spine Wo Contrast  Narrative FINDINGS CLINICAL DATA:  NECK PAIN. MRI OF THE CERVICAL SPINE WITHOUT CONTRAST SAGITTAL NONCONTRAST MRI IMAGES CERVICAL SPINE CORRELATED WITH RADIOGRAPHS OF 09/30/99.  THE PATIENT'S PREVIOUS MRI OF 2001 WAS SIGNED OUT AND NOT RETURNED.  IF THE PATIENT'S EXAM CAN BE RETURNED, DIRECT COMPARISON CAN BE MADE. PATIENT HAS  HAD PRIOR FUSION OF C5-6, 7 WITH ANTERIOR PLATE AND SCREWS.  ASSOCIATED METALLIC ARTIFACTS.  SPINAL CORD NORMAL IN SIGNAL INTENSITY.  CERVICOMEDULLARY JUNCTION AND VISUALIZED BASE OF POSTERIOR FOSSA UNREMARKABLE.  NO FOCAL MARROW ABNORMALITY IS IDENTIFIED. C2-3:  QUESTION TINY CENTRAL DISC PROTRUSION CAUSING NO NERVE ROOT COMPRESSION.  FORAMINA PATENT. C3-4:  LEFT UNCOVERTEBRAL HYPERTROPHY EXTENDING INTO AND NARROWING THE LEFT NEURAL FORAMEN. C4-5:  SIGNIFICANT LEFT UNCOVERTEBRAL HYPERTROPHY AND NARROWING LEFT NEURAL FORAMEN AND QUESTIONABLY COMPRESSION AT THE LEFT C5 ROOT.  CLINICAL CORRELATION FOR SYMPTOMS LEFT C5 DISTRIBUTION RECOMMENDED.  BROAD BASED POSTERIOR DISC PROTRUSION NOTED AT C4-5 EFFACING CSF AND FLATTENING THE VENTRAL ASPECT OF THE SPINAL CORD.  RESIDUAL THECAL SAC DIAMETER 9 MM AT MIDLINE. C5-6:  BILATERAL UNCOVERTEBRAL HYPERTROPHY GREATER ON LEFT WITH QUESTION SPURRING OR FOCAL DISC HERNIATION AT THE LEFT NEURAL FORAMEN, CAUSING MARKED  NEURAL FORAMINAL NARROWING AND SUSPECT COMPRESSION OF THE EXITING LEFT C6 ROOT.  RIGHT FORAMEN PATENT.  NO CORD COMPRESSION. C6-7:  BILATERAL UNCOVERTEBRAL HYPERTROPHY.  NO FOCAL DISC HERNIATION OR NERVE  ROOT COMPRESSION. C7-T1, T1-2:  NO ABNORMALITIES. IMPRESSION BROAD BASED POSTERIOR DISC PROTRUSION AT C4-5 COMPRESSING THE VENTRAL SAC AND VENTRAL ASPECT OF SPINAL CORD AND CAUSING MILD AP SPINAL STENOSIS.  STATUS POST C5-6, 7 FUSION.  LEFT UNILATERAL NEURAL FORAMINAL NARROWING AT C3-4 AND A GREATER DEGREE AT C4-5 WITH QUESTION COMPRESSION OF EXITING LEFT C5 ROOT.  ADDITIONALLY, SPURRING VERSUS RESIDUAL DISC HERNIATING AT THE LEFT C5 NEURAL FORAMEN, PROBABLY COMPRESSION OF LEFT C6 ROOT.  CLINICAL CORRELATION FOR SYMPTOMS IN THE LEFT C5 AND C6 DISTRIBUTION IS RECOMMENDED.    Narrative CLINICAL DATA:  Low back and left leg pain.  Left arm pain.  FLUOROSCOPY TIME:  1 minutes 6 seconds. 1248.83 micro gray meter squared  PROCEDURE: CERVICAL AND LUMBAR MYELOGRAM  CT CERVICAL MYELOGRAM  CT LUMBAR MYELOGRAM  Lumbar puncture and contrast injection was performed by Dr. Danielle Dess from a left-sided approach at L4-5.  I personally performed the  acquisition of the myelogram images.  TECHNIQUE: Contiguous axial images were obtained through the Cervical and Lumbar spine after the intrathecal infusion of infusion. Coronal and sagittal reconstructions were obtained of the axial image sets.  FINDINGS: CERVICAL AND LUMBAR MYELOGRAM FINDINGS:  Lumbar region: Enlarged left L4 nerve. The differential diagnosis is swelling versus tumor. No other focal neural compression seen. Small anterior extradural defect at L2-3 and L3-4 on the lateral view. Standing flexion extension views do not show any subluxation or abnormal motion.  Cervical region: Previous ACDF C5-C7. Anterior extradural defects at C3-4 and C4-5,  CT CERVICAL MYELOGRAM FINDINGS:  Foramen magnum is widely patent.  C1-2 and C2-3 are normal.  C3-4: Spondylosis with uncovertebral prominence and bulging of the disc. Facet osteoarthritis on the left. Narrowing of the subarachnoid space but no compression of the cord.  Foraminal stenosis on the left that could affect the C4 nerve.  C4-5: Spondylosis with endplate osteophytes and bulging of the disc. Narrowing of the subarachnoid space but no compression of the cord. Mild facet osteoarthritis. Bilateral foraminal narrowing that could affect either C5 nerve.  C5-C7: Distant ACDF with solid union and wide patency of the canal and foramina.  C7-T1: Mild facet osteoarthritis.  No canal or foraminal stenosis.  There is some sort of congenital fusion anomaly in the upper thoracic region involving at least T2 and T3, not completely imaged.  CT LUMBAR MYELOGRAM FINDINGS:  T12-L1 and L1-2: Normal.  L2-3: Mild bulging of the disc.  No compressive stenosis.  L3-4:  Mild bulging of the disc. No compressive stenosis. Fusiform enlargement of the left L4 nerve consistent with neurofibroma. No compressive pathology identified.  L4-5: Minimal bulging of the disc.  No canal or foraminal stenosis.  L5-S1: Mild disc degeneration with vacuum phenomenon. Mild bulging of the disc. No stenosis. Mild facet degeneration.  IMPRESSION: Lumbar region: Fusiform enlargement of the left L4 nerve consistent with neurofibroma. No evidence of compressive stenosis. Mild non-compressive disc bulges at L2-3 and L3-4. I did consider the possibility of an intradural disc fragment, but on CT imaging, this appears to be actual enlargement the nerve root. Non-compressive disc degeneration and facet degeneration at L5-S1.  Cervical region: Good appearance in the fusion segment from C5 through C7. Adjacent segment degenerative disease at C3-4 and C4-5 with anterior extradural defects. Foraminal stenosis worse on the left at C3-4 and bilaterally at C4-5 could cause neural compression.   Electronically Signed By: Paulina Fusi M.D. On: 11/15/2017 09:16   Narrative CLINICAL DATA:  67 year old male with left hip pain extending to left shoulder for the past year. No known injury.  Cervical spine surgery over 10 years ago. Initial encounter.  EXAM: CERVICAL SPINE - COMPLETE 4+ VIEW  COMPARISON:  None.  FINDINGS: Prior fusion with anterior plate and screws C5-6 and C6-7 with solid bony fusion across the disc space.  Moderate C4-5 disc degeneration, disc space narrowing and osteophyte formation with minimal retrolisthesis C4. Uncinate hypertrophy greater on the right with right greater than left mild foraminal narrowing.  Mild C7-T1 and T1-2 disc degeneration with osteophyte.  Pacemaker leads noted.  No lung apical lesion identified.  Right carotid bifurcation tiny calcification.  IMPRESSION: Fusion C5-C7.  Moderate C4-5 disc degeneration, disc space narrowing and osteophyte formation with minimal retrolisthesis C4. Uncinate hypertrophy greater on the right with right greater than left mild foraminal narrowing.  Mild C7-T1 and T1-2 disc degeneration with osteophyte.   Electronically Signed By: Lacy Duverney M.D. On: 02/20/2017 18:15   CT SHOULDER LEFT W CONTRAST  Narrative CLINICAL DATA:  Injury of left rotator cuff.  Left shoulder pain.  EXAM: CT ARTHROGRAPHY OF THE LEFT SHOULDER  TECHNIQUE: Multidetector CT imaging was performed following the standard protocol after injection of dilute contrast into the joint.  RADIATION DOSE REDUCTION: This exam was performed according to the departmental dose-optimization program which includes automated exposure control, adjustment of the mA and/or kV according to patient size and/or use of iterative reconstruction technique.  COMPARISON:  Injection images same date.  Radiographs 04/06/2021.  FINDINGS: Bones/Joint/Cartilage  No evidence of acute fracture, dislocation or humeral head osteonecrosis. No focal chondral defects are identified within the humeral head or glenoid. There is satisfactory opacification of the glenohumeral joint with the contrast. Some contrast has leaked into the  subacromial-subdeltoid bursa. There are mild acromioclavicular degenerative changes.  Ligaments  Ligaments are suboptimally evaluated by CT. Probable variant of the anterior superior labrum without evidence of tear.  Muscles and Tendons As above, there is extension of contrast from the glenohumeral joint into subacromial-subdeltoid bursa. This could relate to the injection, and no full-thickness rotator cuff tear is identified. There is some bursal surface irregularity of the distal subscapularis tendon which is likely related to the injection. No rotator cuff muscular atrophy demonstrated. The biceps tendon appears intact.  Soft Tissues No periarticular fluid collection or inflammation identified. Patient has a left subclavian pacemaker. No significant abnormalities are identified within the visualized left lung.  IMPRESSION: 1. Although there is contrast within the subacromial-subdeltoid bursa,  no definitive full-thickness rotator cuff tear is identified. There is a possible partial bursal surface tear of the subscapularis tendon versus sequela of the joint injection. No rotator cuff muscular atrophy. 2. Mild acromioclavicular degenerative changes. 3. No acute osseous findings.   Electronically Signed By: Carey Bullocks M.D. On: 01/25/2022 16:24   CT LUMBAR SPINE W CONTRAST  Narrative CLINICAL DATA:  Bilateral thigh discomfort worse on the left than the right.  EXAM: LUMBAR MYELOGRAM  FLUOROSCOPY: 0 minutes 47 seconds.  227.74 micro gray meter squared  PROCEDURE: After thorough discussion of risks and benefits of the procedure including bleeding, infection, injury to nerves, blood vessels, adjacent structures as well as headache and CSF leak, written and oral informed consent was obtained. Consent was obtained by Dr. Paulina Fusi. Time out form was completed.  Patient was positioned prone on the fluoroscopy table. Local anesthesia was provided with 1%  lidocaine without epinephrine after prepped and draped in the usual sterile fashion. Puncture was performed at L3-4 using a 3 1/2 inch 22-gauge spinal needle via right paramedian approach. Using a single pass through the dura, the needle was placed within the thecal sac, with return of clear CSF. 15 mL of Isovue M-200 was injected into the thecal sac, with normal opacification of the nerve roots and cauda equina consistent with free flow within the subarachnoid space.  I personally performed the lumbar puncture and administered the intrathecal contrast. I also personally performed acquisition of the myelogram images.  TECHNIQUE: Contiguous axial images were obtained through the Lumbar spine after the intrathecal infusion of infusion. Coronal and sagittal reconstructions were obtained of the axial image sets.  COMPARISON:  Prior myelogram 11/15/2017  FINDINGS: LUMBAR MYELOGRAM FINDINGS:  Small anterior extradural defects from L2-3 through L5-S1. No gross compressive stenosis of the canal, lateral recesses or proximal foramina. 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 Standing lateral flexion extension views do not show any abnormal motion.  CT LUMBAR MYELOGRAM FINDINGS:  T12-L1: Normal interspace.  L1-2: Normal interspace.  L2-3: Minimal disc bulge.  No apparent compressive stenosis.  L3-4: Disc degeneration with vacuum phenomenon. Mild bulging of the disc. No compressive stenosis. As seen previously, there is enlargement of the left L4 nerve root consistent with a focal neurofibroma. Maximal dimension 5-6 mm, similar to the prior exam.  L4-5: Minimal disc bulge.  No compressive stenosis.  L5-S1: Minimal disc bulge. No compressive stenosis. Mild facet osteoarthritis. Mild foraminal narrowing on the left but without definite compression of the exiting L5  nerve.  IMPRESSION: L2-3: Mild noncompressive disc bulge.  L3-4: Some worsening of disc degeneration since 2019. Vacuum phenomenon now present. Bulging of the disc with a tiny protrusion with caudal down turning. Mild narrowing of the lateral recesses but no visible neural compression at this level. Neurofibroma of the left L4 nerve root in the lateral recess as seen previously, without evidence of enlargement.  L4-5: Minimal disc bulge.  No stenosis.  L5-S1: Mild disc bulge. No thecal sac or S1 nerve compression. Facet osteoarthritis left more than right with mild left foraminal narrowing, but no definite compression of the exiting L5 nerve. This has worsened slightly since the prior exam.   Electronically Signed By: Paulina Fusi M.D. On: 01/03/2022 14:22   Narrative CLINICAL DATA:  Low back pain  EXAM: LUMBAR SPINE - COMPLETE 4+ VIEW  COMPARISON:  02/20/2017  FINDINGS:  Normal alignment. Diffuse lower lumbar disc space narrowing with endplate bony spurring anteriorly at L3-L5. Facets are aligned. Preserved vertebral body heights. No acute compression fracture, wedge-shaped deformity or focal kyphosis. No pars defects. Normal pedicles and SI joints for age.  Nonobstructive bowel gas pattern.  Aorta atherosclerotic.  IMPRESSION: Lumbar spine endplate degenerative changes. No acute finding by plain radiography  Aortic Atherosclerosis (ICD10-I70.0).   Electronically Signed By: Judie Petit.  Shick M.D. On: 05/27/2020 10:42   Narrative CLINICAL DATA:  Bilateral thigh discomfort worse on the left than the right.  EXAM: LUMBAR MYELOGRAM  FLUOROSCOPY: 0 minutes 47 seconds.  227.74 micro gray meter squared  PROCEDURE: After thorough discussion of risks and benefits of the procedure including bleeding, infection, injury to nerves, blood vessels, adjacent structures as well as headache and CSF leak, written and oral informed consent was obtained. Consent was obtained by  Dr. Paulina Fusi. Time out form was completed.  Patient was positioned prone on the fluoroscopy table. Local anesthesia was provided with 1% lidocaine without epinephrine after prepped and draped in the usual sterile fashion. Puncture was performed at L3-4 using a 3 1/2 inch 22-gauge spinal needle via right paramedian approach. Using a single pass through the dura, the needle was placed within the thecal sac, with return of clear CSF. 15 mL of Isovue M-200 was injected into the thecal sac, with normal opacification of the nerve roots and cauda equina consistent with free flow within the subarachnoid space.  I personally performed the lumbar puncture and administered the intrathecal contrast. I also personally performed acquisition of the myelogram images.  TECHNIQUE: Contiguous axial images were obtained through the Lumbar spine after the intrathecal infusion of infusion. Coronal and sagittal reconstructions were obtained of the axial image sets.  COMPARISON:  Prior myelogram 11/15/2017  FINDINGS: LUMBAR MYELOGRAM FINDINGS:  Small anterior extradural defects from L2-3 through L5-S1. No gross compressive stenosis of the canal, lateral recesses or proximal foramina. 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 Standing lateral flexion extension views do not show any abnormal motion.  CT LUMBAR MYELOGRAM FINDINGS:  T12-L1: Normal interspace.  L1-2: Normal interspace.  L2-3: Minimal disc bulge.  No apparent compressive stenosis.  L3-4: Disc degeneration with vacuum phenomenon. Mild bulging of the disc. No compressive stenosis. As seen previously, there is enlargement of the left L4 nerve root consistent with a focal neurofibroma. Maximal dimension 5-6 mm, similar to the prior exam.  L4-5: Minimal disc bulge.  No compressive stenosis.  L5-S1: Minimal disc  bulge. No compressive stenosis. Mild facet osteoarthritis. Mild foraminal narrowing on the left but without definite compression of the exiting L5 nerve.  IMPRESSION: L2-3: Mild noncompressive disc bulge.  L3-4: Some worsening of disc degeneration since 2019. Vacuum phenomenon now present. Bulging of the disc with a tiny protrusion with caudal down turning. Mild narrowing of the lateral recesses but no visible neural compression at this level. Neurofibroma of the left L4 nerve root in the lateral recess as seen previously, without evidence of enlargement.  L4-5: Minimal disc bulge.  No stenosis.  L5-S1: Mild disc bulge. No thecal sac or S1 nerve compression. Facet osteoarthritis left more than right with mild left foraminal narrowing, but no definite compression of the exiting L5 nerve. This has worsened slightly since the prior exam.   Electronically Signed By: Paulina Fusi M.D. On: 01/03/2022 14:22   DG HIP  UNILAT WITH PELVIS 2-3 VIEWS LEFT  Narrative CLINICAL DATA:  67 year old male with low back pain extending into left leg for the past 2 months. No known injury. Initial encounter.  EXAM: DG HIP (WITH OR WITHOUT PELVIS) 2-3V LEFT  COMPARISON:  None.  FINDINGS: Preservation hip joint. No plain film evidence of avascular necrosis.  No bony destructive lesion.  No fracture or dislocation.  Prostatic calcifications noted.  IMPRESSION: No plain film evidence of left hip abnormality.   Electronically Signed By: Lacy Duverney M.D. On: 02/20/2017 18:09    DG HIPS BILAT WITH PELVIS MIN 5 VIEWS  Narrative CLINICAL DATA:  Bilateral hip pain for the past 5 years.  EXAM: DG HIP (WITH OR WITHOUT PELVIS) 5+V BILAT  COMPARISON:  Left hip dated 11/22/2017  FINDINGS: Normal appearing hips. Minimal lower lumbar spine degenerative changes. Small amount of prostate calcification without significant change.  IMPRESSION: 1. Normal appearing hips. 2. Minimal  lower lumbar spine degenerative changes.   Electronically Signed By: Beckie Salts M.D. On: 08/13/2020 17:54  DG Knee 1-2 Views Left  Narrative CLINICAL DATA:  Chronic left knee pain since knee surgery in the past. The patient also describes a bump on his anterior knee near the patella.  EXAM: LEFT KNEE - 1-2 VIEW  COMPARISON:  06/30/2005  FINDINGS: Stable minimal patellofemoral, medial and lateral spur formation. No fracture, dislocation or effusion. No visible mass.  IMPRESSION: Stable minimal tricompartmental degenerative changes. No acute abnormality.   Electronically Signed By: Beckie Salts M.D. On: 11/01/2022 13:17   DG Knee Complete 4 Views Left  Narrative Clinical Data: Left knee pain for 6 months. LEFT KNEE - 4 VIEW: Findings: There is some mild medial compartment joint space narrowing. Very small osteophytes are identified in all three compartments. No fracture, dislocation or joint effusion.  Impression Mild degenerative change about the knee without acute abnormality.   Provider: Marta Antu  DG Elbow Complete Right  Narrative Clinical Data: .  Right elbow pain, attention to olecranon.  RIGHT ELBOW - COMPLETE 3+ VIEW  Comparison: None  Findings: There is no evidence of fracture dislocation or joint effusion.  There is no evidence of arthropathy or other focal bone abnormality.  Soft tissues are unremarkable.  IMPRESSION: Negative.  Provider: Rozanna Box   Complexity Note: Imaging results reviewed.                         ROS  Cardiovascular: Heart trouble, Abnormal heart rhythm, Daily Aspirin intake, High blood pressure, Chest pain, Pacemaker or defibrillator, Heart murmur, Heart catheterization, and Blood thinners:  Anticoagulant Pulmonary or Respiratory: Shortness of breath, Snoring , and Coughing up mucus (Bronchitis) Neurological: No reported neurological signs or symptoms such as seizures, abnormal skin sensations, urinary  and/or fecal incontinence, being born with an abnormal open spine and/or a tethered spinal cord Psychological-Psychiatric: Psychiatric disorder, Anxiousness, Depressed, Prone to panicking, and Difficulty sleeping and or falling asleep Gastrointestinal: Heartburn due to stomach pushing into lungs (Hiatal hernia) and Reflux or heatburn Genitourinary: No reported renal or genitourinary signs or symptoms such as difficulty voiding or producing urine, peeing blood, non-functioning kidney, kidney stones, difficulty emptying the bladder, difficulty controlling the flow of urine, or chronic kidney disease Hematological: No reported hematological signs or symptoms such as prolonged bleeding, low or poor functioning platelets, bruising or bleeding easily, hereditary bleeding problems, low energy levels due to low hemoglobin or being anemic Endocrine: High blood sugar controlled without the use of  insulin (NIDDM) Rheumatologic: No reported rheumatological signs and symptoms such as fatigue, joint pain, tenderness, swelling, redness, heat, stiffness, decreased range of motion, with or without associated rash Musculoskeletal: Negative for myasthenia gravis, muscular dystrophy, multiple sclerosis or malignant hyperthermia Work History: Disabled  Allergies  Mr. Greenia is allergic to morphine and codeine, lasix [furosemide], and latex.  Laboratory Chemistry Profile   Renal Lab Results  Component Value Date   BUN 14 08/21/2022   CREATININE 1.06 08/21/2022   BCR 13 08/21/2022   GFR 74.38 10/08/2013   GFRAA 91 06/11/2020   GFRNONAA >60 02/22/2022   SPECGRAV 1.015 06/13/2017   PHUR 5.0 06/13/2017     Electrolytes Lab Results  Component Value Date   NA 143 08/21/2022   K 4.6 08/21/2022   CL 102 08/21/2022   CALCIUM 10.3 (H) 08/21/2022   MG 2.3 10/16/2012     Hepatic Lab Results  Component Value Date   AST 23 08/21/2022   ALT 23 08/21/2022   ALBUMIN 5.0 (H) 08/21/2022   ALKPHOS 58 08/21/2022      ID Lab Results  Component Value Date   HIV Non Reactive 10/16/2017   SARSCOV2NAA NEGATIVE 06/11/2020   MRSAPCR NEGATIVE 12/17/2012     Bone No results found for: "VD25OH", "VD125OH2TOT", "ZO1096EA5", "VD2125OH2", "25OHVITD1", "25OHVITD2", "25OHVITD3", "TESTOFREE", "TESTOSTERONE"   Endocrine Lab Results  Component Value Date   GLUCOSE 130 (H) 08/21/2022   HGBA1C 6.4 (H) 08/21/2022   TSH 1.730 12/14/2020   FREET4 0.94 10/08/2013     Neuropathy Lab Results  Component Value Date   HGBA1C 6.4 (H) 08/21/2022   HIV Non Reactive 10/16/2017     CNS No results found for: "COLORCSF", "APPEARCSF", "RBCCOUNTCSF", "WBCCSF", "POLYSCSF", "LYMPHSCSF", "EOSCSF", "PROTEINCSF", "GLUCCSF", "JCVIRUS", "CSFOLI", "IGGCSF", "LABACHR", "ACETBL"   Inflammation (CRP: Acute  ESR: Chronic) Lab Results  Component Value Date   CRP 3.7 12/14/2014   ESRSEDRATE 2 12/14/2020   LATICACIDVEN 0.9 10/15/2017     Rheumatology No results found for: "RF", "ANA", "LABURIC", "URICUR", "LYMEIGGIGMAB", "LYMEABIGMQN", "HLAB27"   Coagulation Lab Results  Component Value Date   INR 1.0 02/22/2022   LABPROT 12.7 02/22/2022   APTT 31 12/16/2012   PLT 190 08/21/2022   DDIMER 0.32 12/14/2020     Cardiovascular Lab Results  Component Value Date   BNP 68.9 02/22/2022   TROPONINI <0.03 05/23/2015   HGB 17.0 08/21/2022   HCT 51.6 (H) 08/21/2022     Screening Lab Results  Component Value Date   SARSCOV2NAA NEGATIVE 06/11/2020   MRSAPCR NEGATIVE 12/17/2012   HIV Non Reactive 10/16/2017     Cancer No results found for: "CEA", "CA125", "LABCA2"   Allergens No results found for: "ALMOND", "APPLE", "ASPARAGUS", "AVOCADO", "BANANA", "BARLEY", "BASIL", "BAYLEAF", "GREENBEAN", "LIMABEAN", "WHITEBEAN", "BEEFIGE", "REDBEET", "BLUEBERRY", "BROCCOLI", "CABBAGE", "MELON", "CARROT", "CASEIN", "CASHEWNUT", "CAULIFLOWER", "CELERY"     Note: Lab results reviewed.  PFSH  Drug: Mr. Lipper  reports that he does not  currently use drugs after having used the following drugs: Marijuana. Alcohol:  reports current alcohol use of about 7.0 - 14.0 standard drinks of alcohol per week. Tobacco:  reports that he quit smoking about 19 years ago. His smoking use included cigarettes. He started smoking about 55 years ago. He has a 99.9 pack-year smoking history. He has quit using smokeless tobacco. Medical:  has a past medical history of ADD (attention deficit disorder), Anginal pain (HCC), Anxiety, Aortic atherosclerosis (HCC) (05/27/2020), AV block, 3rd degree (HCC), Colon polyps (2011), Complete heart block [facility]), Coronary artery  disease, Degenerative disc disease, cervical, Depression, DM (diabetes mellitus) (HCC), Gastrointestinal bleeding (10/15/2017), GERD (gastroesophageal reflux disease), Hyperlipidemia, Hypertension, Pacemaker, Palpitations (2003), Pneumonia, Prostatitis, Seasonal allergies, and Sleep apnea. Family: family history includes Breast cancer in his maternal aunt; Heart disease in his mother; Hypertension in his mother; Prostate cancer (age of onset: 38) in his brother.  Past Surgical History:  Procedure Laterality Date   ANTERIOR CERVICAL DECOMP/DISCECTOMY FUSION     BACK SURGERY     Biventricular pacemaker upgrade/ system revision  04/02/2014   removal of previous atrial and ventricular leads with placement of a new MDT Consulta CRT-P system at [facility] by Dr [facility]   COLONOSCOPY  2017   MS-MAC-suprep(good)-leiomyoma/TA   COLONOSCOPY W/ POLYPECTOMY  2011   tubular adenoma   CORONARY BALLOON ANGIOPLASTY N/A 06/15/2020   Procedure: CORONARY BALLOON ANGIOPLASTY;  Surgeon: Corky Crafts, MD;  Location: [facility] INVASIVE CV LAB;  Service: Cardiovascular;  Laterality: N/A;   CORONARY STENT INTERVENTION N/A 08/22/2018   Procedure: CORONARY STENT INTERVENTION;  Surgeon: Lennette Bihari, MD;  Location: [facility] INVASIVE CV LAB;  Service: Cardiovascular;  Laterality: N/A;   CORONARY  ULTRASOUND/IVUS N/A 06/15/2020   Procedure: Intravascular Ultrasound/IVUS;  Surgeon: Corky Crafts, MD;  Location: [facility] INVASIVE CV LAB;  Service: Cardiovascular;  Laterality: N/A;   ESOPHAGOGASTRODUODENOSCOPY     Gastritis   INSERT / REPLACE / REMOVE PACEMAKER  12/17/2012   MDT Adapta L implanted by Dr Johney Frame for mobitz II second degree AV block   KNEE SURGERY Right    LEFT HEART CATH AND CORONARY ANGIOGRAPHY N/A 08/22/2018   Procedure: LEFT HEART CATH AND CORONARY ANGIOGRAPHY;  Surgeon: Lennette Bihari, MD;  Location: [facility] INVASIVE CV LAB;  Service: Cardiovascular;  Laterality: N/A;   LEFT HEART CATH AND CORONARY ANGIOGRAPHY N/A 06/15/2020   Procedure: LEFT HEART CATH AND CORONARY ANGIOGRAPHY;  Surgeon: Corky Crafts, MD;  Location: [facility] INVASIVE CV LAB;  Service: Cardiovascular;  Laterality: N/A;   LEFT HEART CATH AND CORONARY ANGIOGRAPHY N/A 09/01/2021   Procedure: LEFT HEART CATH AND CORONARY ANGIOGRAPHY;  Surgeon: Lennette Bihari, MD;  Location: [facility] INVASIVE CV LAB;  Service: Cardiovascular;  Laterality: N/A;   LEFT HEART CATHETERIZATION WITH CORONARY ANGIOGRAM N/A 10/17/2012   Procedure: LEFT HEART CATHETERIZATION WITH CORONARY ANGIOGRAM;  Surgeon: Wendall Stade, MD;  Location: [facility] CATH LAB;  Service: Cardiovascular;  Laterality: N/A;   LEFT HEART CATHETERIZATION WITH CORONARY ANGIOGRAM N/A 12/17/2012   Procedure: LEFT HEART CATHETERIZATION WITH CORONARY ANGIOGRAM;  Surgeon: Peter M Swaziland, MD;  Location: [facility] CATH LAB;  Service: Cardiovascular;  Laterality: N/A;   LEFT HEART CATHETERIZATION WITH CORONARY ANGIOGRAM N/A 06/11/2013   Procedure: LEFT HEART CATHETERIZATION WITH CORONARY ANGIOGRAM;  Surgeon: Iran Ouch, MD;  Location: [facility] CATH LAB;  Service: Cardiovascular;  Laterality: N/A;   PERCUTANEOUS CORONARY STENT INTERVENTION (PCI-S)  10/17/2012   Procedure: PERCUTANEOUS CORONARY STENT INTERVENTION (PCI-S);  Surgeon: Wendall Stade, MD;  Location: [facility] CATH LAB;  Service:  Cardiovascular;;   PERMANENT PACEMAKER INSERTION N/A 12/17/2012   Procedure: PERMANENT PACEMAKER INSERTION;  Surgeon: Hillis Range, MD;  Location: [facility] CATH LAB;  Service: Cardiovascular;  Laterality: N/A;   POLYPECTOMY  2017   TA   UPPER GASTROINTESTINAL ENDOSCOPY     Active Ambulatory Problems    Diagnosis Date Noted   GERD (gastroesophageal reflux disease) 12/20/2011   Hx of adenomatous colonic polyps 12/20/2011   Hypertension    DOE (dyspnea on exertion) 12/26/2011   Coronary artery  disease involving native coronary artery of native heart with angina pectoris (HCC) 10/18/2012   S/P drug eluting coronary stent placement 10/18/2012   BPH (benign prostatic hyperplasia) 08/25/2013   OSA (obstructive sleep apnea) 10/16/2013   Hyperlipidemia 01/04/2016   Stable angina pectoris    Aortic atherosclerosis (HCC) 05/27/2020   Upper airway cough syndrome 12/14/2020   Acquired complete AV block (HCC) 04/02/2014   Acute bronchitis 04/17/2022   Lumbar radiculopathy 12/26/2022   Cervical spondylosis 12/26/2022   Cervical radicular pain 12/26/2022   S/P cervical spinal fusion 12/26/2022   Chronic pain syndrome 12/26/2022   Resolved Ambulatory Problems    Diagnosis Date Noted   ADD (attention deficit disorder)    Palpitations    Abdominal  pain 12/26/2011   Bradycardia 12/17/2012   Mobitz type II atrioventricular block 12/17/2012   Chest pain, exertional 12/26/2012   Obstructive sleep apnea 08/25/2013   Chest pain of uncertain etiology 08/25/2013   Anxiety as acute reaction to exceptional stress 05/28/2015   Hematochezia 10/15/2017   Ischemic colitis (HCC)    Acute rhinosinusitis 03/19/2020   Blepharospasm of both eyes 06/10/2018   Disorder of cardiac pacemaker system 04/02/2014   Dizziness 06/10/2018   Encounter for disability determination 01/27/2021   Inguinal hernia, left 02/11/2015   Noise effect on both inner ears 06/10/2018   Acute bronchitis 06/22/2021   Acute bronchitis  12/09/2021   Past Medical History:  Diagnosis Date   Anginal pain (HCC)    Anxiety    AV block, 3rd degree (HCC)    Colon polyps 2011   Complete heart block (HCC)    Coronary artery disease    Degenerative disc disease, cervical    Depression    DM (diabetes mellitus) (HCC)    Gastrointestinal bleeding 10/15/2017   Pacemaker    Pneumonia    Prostatitis    Seasonal allergies    Sleep apnea    Constitutional Exam  General appearance: Well nourished, well developed, and well hydrated. In no apparent acute distress Vitals:   12/26/22 1346  BP: (!) 175/82  Pulse: 63  Resp: 18  Temp: (!) 97.5 F (36.4 C)  TempSrc: Skin  SpO2: 97%  Weight: 218 lb (98.9 kg)  Height: 5\' 11"  (1.803 m)   BMI Assessment: Estimated body mass index is 30.4 kg/m as calculated from the following:   Height as of this encounter: 5\' 11"  (1.803 m).   Weight as of this encounter: 218 lb (98.9 kg).  BMI interpretation table: BMI level Category Range association with higher incidence of chronic pain  <18 kg/m2 Underweight   18.5-24.9 kg/m2 Ideal body weight   25-29.9 kg/m2 Overweight Increased incidence by 20%  30-34.9 kg/m2 Obese (Class I) Increased incidence by 68%  35-39.9 kg/m2 Severe obesity (Class II) Increased incidence by 136%  >40 kg/m2 Extreme obesity (Class III) Increased incidence by 254%   Patient's current BMI Ideal Body weight  Body mass index is 30.4 kg/m. Ideal body weight: 75.3 kg (166 lb 0.1 oz) Adjusted ideal body weight: 84.7 kg (186 lb 12.9 oz)   BMI Readings from Last 4 Encounters:  12/26/22 30.40 kg/m  11/07/22 31.07 kg/m  10/17/22 30.90 kg/m  08/22/22 30.73 kg/m   Wt Readings from Last 4 Encounters:  12/26/22 218 lb (98.9 kg)  11/07/22 221 lb 3.2 oz (100.3 kg)  10/17/22 220 lb (99.8 kg)  08/22/22 218 lb 12.8 oz (99.2 kg)    Psych/Mental status: Alert, oriented x 3 (person, place, & time)  Eyes: PERLA Respiratory: No evidence of acute respiratory  distress  Cervical Spine Area Exam  Skin & Axial Inspection: Well healed scar from previous spine surgery detected Alignment: Symmetrical Functional ROM: Pain restricted ROM, bilaterally Stability: No instability detected Muscle Tone/Strength: Functionally intact. No obvious neuro-muscular anomalies detected. Sensory (Neurological): Dermatomal pain pattern and neurogenic Palpation: No palpable anomalies              Upper Extremity (UE) Exam    Side: Right upper extremity  Side: Left upper extremity  Skin & Extremity Inspection: Skin color, temperature, and hair growth are WNL. No peripheral edema or cyanosis. No masses, redness, swelling, asymmetry, or associated skin lesions. No contractures.  Skin & Extremity Inspection: Skin color, temperature, and hair growth are WNL. No peripheral edema or cyanosis. No masses, redness, swelling, asymmetry, or associated skin lesions. No contractures.  Functional ROM: Unrestricted ROM          Functional ROM: Pain restricted ROM for shoulder  Muscle Tone/Strength: Functionally intact. No obvious neuro-muscular anomalies detected.  Muscle Tone/Strength: Functionally intact. No obvious neuro-muscular anomalies detected.  Sensory (Neurological): Unimpaired          Sensory (Neurological): Dermatomal pain pattern          Palpation: No palpable anomalies              Palpation: No palpable anomalies              Provocative Test(s):  Phalen's test: deferred Tinel's test: deferred Apley's scratch test (touch opposite shoulder):  Action 1 (Across chest): deferred Action 2 (Overhead): deferred Action 3 (LB reach): deferred   Provocative Test(s):  Phalen's test: deferred Tinel's test: deferred Apley's scratch test (touch opposite shoulder):  Action 1 (Across chest): deferred Action 2 (Overhead): deferred Action 3 (LB reach): deferred    Thoracic Spine Area Exam  Skin & Axial Inspection: No masses, redness, or swelling Alignment:  Symmetrical Functional ROM: Unrestricted ROM Stability: No instability detected Muscle Tone/Strength: Functionally intact. No obvious neuro-muscular anomalies detected. Sensory (Neurological): Unimpaired Muscle strength & Tone: No palpable anomalies Lumbar Spine Area Exam  Skin & Axial Inspection: No masses, redness, or swelling Alignment: Symmetrical Functional ROM: Pain restricted ROM affecting primarily the left Stability: No instability detected Muscle Tone/Strength: Functionally intact. No obvious neuro-muscular anomalies detected. Sensory (Neurological): Dermatomal pain pattern Palpation: No palpable anomalies       Provocative Tests: Hyperextension/rotation test: deferred today       Lumbar quadrant test (Kemp's test): (+) on the left for foraminal stenosis Lateral bending test: (+) ipsilateral radicular pain, on the left. Positive for left-sided foraminal stenosis.  Gait & Posture Assessment  Ambulation: Unassisted Gait: Relatively normal for age and body habitus Posture: WNL  Lower Extremity Exam    Side: Right lower extremity  Side: Left lower extremity  Stability: No instability observed          Stability: No instability observed          Skin & Extremity Inspection: Skin color, temperature, and hair growth are WNL. No peripheral edema or cyanosis. No masses, redness, swelling, asymmetry, or associated skin lesions. No contractures.  Skin & Extremity Inspection: Skin color, temperature, and hair growth are WNL. No peripheral edema or cyanosis. No masses, redness, swelling, asymmetry, or associated skin lesions. No contractures.  Functional ROM: Unrestricted ROM                  Functional ROM: Pain restricted ROM for hip joint  Muscle Tone/Strength: Functionally intact. No obvious neuro-muscular anomalies detected.  Muscle Tone/Strength: Functionally intact. No obvious neuro-muscular anomalies detected.  Sensory (Neurological): Unimpaired        Sensory  (Neurological): Neurogenic pain pattern        DTR: Patellar: deferred today Achilles: deferred today Plantar: deferred today  DTR: Patellar: deferred today Achilles: deferred today Plantar: deferred today  Palpation: No palpable anomalies  Palpation: No palpable anomalies    Assessment  Primary Diagnosis & Pertinent Problem List: The primary encounter diagnosis was Chronic radicular lumbar pain. Diagnoses of Lumbar radiculopathy, Cervical spondylosis, Cervical radicular pain, S/P cervical spinal fusion, Chronic pain syndrome, and Cervicalgia were also pertinent to this visit.  Visit Diagnosis (New problems to examiner): 1. Chronic radicular lumbar pain   2. Lumbar radiculopathy   3. Cervical spondylosis   4. Cervical radicular pain   5. S/P cervical spinal fusion   6. Chronic pain syndrome   7. Cervicalgia    Plan of Care (Initial workup plan)   Problem-specific plan: Lumbar radiculopathy Reviewed myelogram with patient which shows enlargement of the left L4 nerve root consistent with focal neurofibroma.  He also may have mild foraminal stenosis of the left L5 nerve root as well.  He has done home stretching exercises in the past.  We discussed a left L4 and L5 transforaminal ESI.  Cervical spondylosis Cervical spine pain, history of ACDF.  Takes acetaminophen.  Limited range of motion.  Recommend CT of cervical spine for further workup.  Could consider cervical ESI  Cervical radicular pain History of ACDF, increased cervical spine pain with radiation into left shoulder and left hand  Chronic pain syndrome General Recommendations: The pain condition that the patient suffers from is best treated with a multidisciplinary approach that involves an increase in physical activity to prevent de-conditioning and worsening of the pain cycle, as well as psychological counseling (formal and/or informal) to address the co-morbid psychological affects of pain. Treatment will often involve  judicious use of  interventional procedures to decrease the pain, allowing the patient to participate in the physical activity that will ultimately produce long-lasting pain reductions. The goal of the multidisciplinary approach is to return the patient to a higher level of overall function and to restore their ability to perform activities of daily living.   Imaging Orders         CT CERVICAL SPINE WO CONTRAST      Procedure Orders         Lumbar Transforaminal Epidural       Interventional management options: Mr. Condry was informed that there is no guarantee that he would be a candidate for interventional therapies. The decision will be based on the results of diagnostic studies, as well as Mr. Busker's risk profile.  Procedure(s) under consideration:  Left cervical ESI Left lumbar transforaminal ESI L4, L5 Interlaminar ESI Spinal cord stimulation     Provider-requested follow-up: Return in about 15 days (around 01/10/2023) for left L4 +L5 TF ESI, in clinic [facility].  Future Appointments  Date Time Provider Department Center  12/27/2022  9:20 AM Babs Sciara, MD [facility]  01/12/2023  3:30 PM Sharlene Dory, NP [facility]  01/19/2023  7:05 AM [facility] DEVICE REMOTES [facility]  02/22/2023  1:10 PM Babs Sciara, MD [facility]  04/06/2023  1:00 PM [facility]-ANNUAL WELLNESS VISIT [facility]  04/20/2023  7:05 AM [facility] DEVICE REMOTES [facility]  04/27/2023 10:00 AM Mealor, Roberts Gaudy, MD [facility]  07/20/2023  7:05  AM [facility] DEVICE REMOTES [facility]    Duration of encounter: .  Total time on encounter, as per AMA guidelines included both the face-to-face and non-face-to-face time personally spent by the physician and/or other qualified health care professional(s) on the day of the encounter (includes time in activities that require the physician or other qualified health care professional and does not  include time in activities normally performed by clinical staff). Physician's time may include the following activities when performed: Preparing to see the patient (e.g., pre-charting review of records, searching for previously ordered imaging, lab work, and nerve conduction tests) Review of prior analgesic pharmacotherapies. Reviewing PMP Interpreting ordered tests (e.g., lab work, imaging, nerve conduction tests) Performing post-procedure evaluations, including interpretation of diagnostic procedures Obtaining and/or reviewing separately obtained history Performing a medically appropriate examination and/or evaluation Counseling and educating the patient/family/caregiver Ordering medications, tests, or procedures Referring and communicating with other health care professionals (when not separately reported) Documenting clinical information in the electronic or other health record Independently interpreting results (not separately reported) and communicating results to the patient/ family/caregiver Care coordination (not separately reported)  Note by: Edward Jolly, MD (TTS technology used. I apologize for any typographical errors that were not detected and corrected.) Date: 12/26/2022; Time: 3:12 PM

## 2022-12-26 NOTE — Patient Instructions (Signed)
______________________________________________________________________    General Risks and Possible Complications  Patient Responsibilities: It is important that you read this as it is part of your informed consent. It is our duty to inform you of the risks and possible complications associated with treatments offered to you. It is your responsibility as a patient to read this and to ask questions about anything that is not clear or that you believe was not covered in this document.  Patient's Rights: You have the right to refuse treatment. You also have the right to change your mind, even after initially having agreed to have the treatment done. However, under this last option, if you wait until the last second to change your mind, you may be charged for the materials used up to that point.  Introduction: Medicine is not an Visual merchandiser. Everything in Medicine, including the lack of treatment(s), carries the potential for danger, harm, or loss (which is by definition: Risk). In Medicine, a complication is a secondary problem, condition, or disease that can aggravate an already existing one. All treatments carry the risk of possible complications. The fact that a side effects or complications occurs, does not imply that the treatment was conducted incorrectly. It must be clearly understood that these can happen even when everything is done following the highest safety standards.  No treatment: You can choose not to proceed with the proposed treatment alternative. The "PRO(s)" would include: avoiding the risk of complications associated with the therapy. The "CON(s)" would include: not getting any of the treatment benefits. These benefits fall under one of three categories: diagnostic; therapeutic; and/or palliative. Diagnostic benefits include: getting information which can ultimately lead to improvement of the disease or symptom(s). Therapeutic benefits are those associated with the successful  treatment of the disease. Finally, palliative benefits are those related to the decrease of the primary symptoms, without necessarily curing the condition (example: decreasing the pain from a flare-up of a chronic condition, such as incurable terminal cancer).  General Risks and Complications: These are associated to most interventional treatments. They can occur alone, or in combination. They fall under one of the following six (6) categories: no benefit or worsening of symptoms; bleeding; infection; nerve damage; allergic reactions; and/or death. No benefits or worsening of symptoms: In Medicine there are no guarantees, only probabilities. No healthcare provider can ever guarantee that a medical treatment will work, they can only state the probability that it may. Furthermore, there is always the possibility that the condition may worsen, either directly, or indirectly, as a consequence of the treatment. Bleeding: This is more common if the patient is taking a blood thinner, either prescription or over the counter (example: Goody Powders, Fish oil, Aspirin, Garlic, etc.), or if suffering a condition associated with impaired coagulation (example: Hemophilia, cirrhosis of the liver, low platelet counts, etc.). However, even if you do not have one on these, it can still happen. If you have any of these conditions, or take one of these drugs, make sure to notify your treating physician. Infection: This is more common in patients with a compromised immune system, either due to disease (example: diabetes, cancer, human immunodeficiency virus [HIV], etc.), or due to medications or treatments (example: therapies used to treat cancer and rheumatological diseases). However, even if you do not have one on these, it can still happen. If you have any of these conditions, or take one of these drugs, make sure to notify your treating physician. Nerve Damage: This is more common when the treatment is  an invasive one, but it  can also happen with the use of medications, such as those used in the treatment of cancer. The damage can occur to small secondary nerves, or to large primary ones, such as those in the spinal cord and brain. This damage may be temporary or permanent and it may lead to impairments that can range from temporary numbness to permanent paralysis and/or brain death. Allergic Reactions: Any time a substance or material comes in contact with our body, there is the possibility of an allergic reaction. These can range from a mild skin rash (contact dermatitis) to a severe systemic reaction (anaphylactic reaction), which can result in death. Death: In general, any medical intervention can result in death, most of the time due to an unforeseen complication. ______________________________________________________________________      ______________________________________________________________________    Preparing for your procedure  Appointments: If you think you may not be able to keep your appointment, call 24-48 hours in advance to cancel. We need time to make it available to others.  During your procedure appointment there will be: No Prescription Refills. No disability issues to discussed. No medication changes or discussions.  Instructions: Food intake: Avoid eating anything solid for at least 8 hours prior to your procedure. Clear liquid intake: You may take clear liquids such as water up to 2 hours prior to your procedure. (No carbonated drinks. No soda.) Transportation: Unless otherwise stated by your physician, bring a driver. (Driver cannot be a Market researcher, Pharmacist, community, or any other form of public transportation.) Morning Medicines: Except for blood thinners, take all of your other morning medications with a sip of water. Make sure to take your heart and blood pressure medicines. If your blood pressure's lower number is above 100, the case will be rescheduled. Blood thinners: Make sure to stop your blood  thinners as instructed.  If you take a blood thinner, but were not instructed to stop it, call our office [phone] and ask to talk to a nurse. Not stopping a blood thinner prior to certain procedures could lead to serious complications. Diabetics on insulin: Notify the staff so that you can be scheduled 1st case in the morning. If your diabetes requires high dose insulin, take only  of your normal insulin dose the morning of the procedure and notify the staff that you have done so. Preventing infections: Shower with an antibacterial soap the morning of your procedure.  Build-up your immune system: Take 1000 mg of Vitamin C with every meal (3 times a day) the day prior to your procedure. Antibiotics: Inform the nursing staff if you are taking any antibiotics or if you have any conditions that may require antibiotics prior to procedures. (Example: recent joint implants)   Pregnancy: If you are pregnant make sure to notify the nursing staff. Not doing so may result in injury to the fetus, including death.  Sickness: If you have a cold, fever, or any active infections, call and cancel or reschedule your procedure. Receiving steroids while having an infection may result in complications. Arrival: You must be in the facility at least 30 minutes prior to your scheduled procedure. Tardiness: Your scheduled time is also the cutoff time. If you do not arrive at least 15 minutes prior to your procedure, you will be rescheduled.  Children: Do not bring any children with you. Make arrangements to keep them home. Dress appropriately: There is always a possibility that your clothing may get soiled. Avoid long dresses. Valuables: Do not bring any jewelry  or valuables.  Reasons to call and reschedule or cancel your procedure: (Following these recommendations will minimize the risk of a serious complication.) Surgeries: Avoid having procedures within 2 weeks of any surgery. (Avoid for 2 weeks before or after any  surgery). Flu Shots: Avoid having procedures within 2 weeks of a flu shots or . (Avoid for 2 weeks before or after immunizations). Barium: Avoid having a procedure within 7-10 days after having had a radiological study involving the use of radiological contrast. (Myelograms, Barium swallow or enema study). Heart attacks: Avoid any elective procedures or surgeries for the initial 6 months after a "Myocardial Infarction" (Heart Attack). Blood thinners: It is imperative that you stop these medications before procedures. Let us know if you if you take any blood thinner.  Infection: Avoid procedures during or within two weeks of an infection (including chest colds or gastrointestinal problems). Symptoms associated with infections include: Localized redness, fever, chills, night sweats or profuse sweating, burning sensation when voiding, cough, congestion, stuffiness, runny nose, sore throat, diarrhea, nausea, vomiting, cold or Flu symptoms, recent or current infections. It is specially important if the infection is over the area that we intend to treat. Heart and lung problems: Symptoms that may suggest an active cardiopulmonary problem include: cough, chest pain, breathing difficulties or shortness of breath, dizziness, ankle swelling, uncontrolled high or unusually low blood pressure, and/or palpitations. If you are experiencing any of these symptoms, cancel your procedure and contact your primary care physician for an evaluation.  Remember:  Regular Business hours are:  Monday to Thursday 8:00 AM to 4:00 PM  Provider's Schedule: Delano Metz, MD:  Procedure days: Tuesday and Thursday 7:30 AM to 4:00 PM  Edward Jolly, MD:  Procedure days: Monday and Wednesday 7:30 AM to 4:00 PM Last  Updated: 12/05/2022 ______________________________________________________________________     Selective Nerve Root Block Patient Information  Description: Specific nerve roots exit the spinal canal and these  nerves can be compressed and inflamed by a bulging disc and bone spurs.  By injecting steroids on the nerve root, we can potentially decrease the inflammation surrounding these nerves, which often leads to decreased pain.  Also, by injecting local anesthesia on the nerve root, this can provide [place] helpful information to give to your referring doctor if it decreases your pain.  Selective nerve root blocks can be done along the spine from the neck to the low back depending on the location of your pain.   After numbing the skin with local anesthesia, a small needle is passed to the nerve root and the position of the needle is verified using x-ray pictures.  After the needle is in correct position, we then deposit the medication.  You may experience a pressure sensation while this is being done.  The entire block usually lasts less than 15 minutes.  Conditions that may be treated with selective nerve root blocks: Low back and leg pain Spinal stenosis Diagnostic block prior to potential surgery Neck and arm pain Post laminectomy syndrome  Preparation for the injection:  Do not eat any solid food or dairy products within 8 hours of your appointment. You may drink clear liquids up to 3 hours before an appointment.  Clear liquids include water, black coffee, juice or soda.  No milk or cream please. You may take your regular medications, including pain medications, with a sip of water before your appointment.  Diabetics should hold regular insulin (if taken separately) and take 1/2 normal NPH dose the morning of the  procedure.  Carry some sugar containing items with you to your appointment. A driver must accompany you and be prepared to drive you home after your procedure. Bring all your current medications with you. An IV may be inserted and sedation may be given at the discretion of the physician. A blood pressure cuff, EKG, and other monitors will often be applied during the procedure.  Some patients may  need to have extra oxygen administered for a short period. You will be asked to provide medical information, including allergies, prior to the procedure.  We must know immediately if you are taking blood  Thinners (like Coumadin) or if you are allergic to IV iodine contrast (dye).  Possible side-effects: All are usually temporary Bleeding from needle site Light headedness Numbness and tingling Decreased blood pressure Weakness in arms/legs Pressure sensation in back/neck Pain at injection site (several days)  Possible complications: All are extremely rare Infection Nerve injury Spinal headache (a headache wore with upright position)  Call if you experience: Fever/chills associated with headache or increased back/neck pain Headache worsened by an upright position New onset weakness or numbness of an extremity below the injection site Hives or difficulty breathing (go to the emergency room) Inflammation or drainage at the injection site(s) Severe back/neck pain greater than usual New symptoms which are concerning to you  Please note:  Although the local anesthetic injected can often make your back or neck feel good for several hours after the injection the pain will likely return.  It takes 3-5 days for steroids to work on the nerve root. You may not notice any pain relief for at least one week.  If effective, we will often do a series of 3 injections spaced 3-6 weeks apart to maximally decrease your pain.    If you have any questions, please call [phone] [facility]

## 2022-12-26 NOTE — Assessment & Plan Note (Signed)
Cervical spine pain, history of ACDF.  Takes acetaminophen.  Limited range of motion.  Recommend CT of cervical spine for further workup.  Could consider cervical ESI

## 2022-12-27 ENCOUNTER — Ambulatory Visit (INDEPENDENT_AMBULATORY_CARE_PROVIDER_SITE_OTHER): Payer: Medicare Other | Admitting: Family Medicine

## 2022-12-27 VITALS — BP 134/84 | HR 60 | Temp 98.2°F | Ht 71.0 in | Wt 221.2 lb

## 2022-12-27 DIAGNOSIS — E1169 Type 2 diabetes mellitus with other specified complication: Secondary | ICD-10-CM

## 2022-12-27 DIAGNOSIS — E118 Type 2 diabetes mellitus with unspecified complications: Secondary | ICD-10-CM | POA: Diagnosis not present

## 2022-12-27 DIAGNOSIS — I10 Essential (primary) hypertension: Secondary | ICD-10-CM | POA: Diagnosis not present

## 2022-12-27 DIAGNOSIS — K4090 Unilateral inguinal hernia, without obstruction or gangrene, not specified as recurrent: Secondary | ICD-10-CM

## 2022-12-27 DIAGNOSIS — R350 Frequency of micturition: Secondary | ICD-10-CM

## 2022-12-27 DIAGNOSIS — Z79899 Other long term (current) drug therapy: Secondary | ICD-10-CM

## 2022-12-27 DIAGNOSIS — N401 Enlarged prostate with lower urinary tract symptoms: Secondary | ICD-10-CM | POA: Diagnosis not present

## 2022-12-27 DIAGNOSIS — E785 Hyperlipidemia, unspecified: Secondary | ICD-10-CM

## 2022-12-27 MED ORDER — TAMSULOSIN HCL 0.4 MG PO CAPS: ORAL_CAPSULE | ORAL | 5 refills | Status: DC

## 2022-12-27 NOTE — Progress Notes (Signed)
   Subjective:    Patient ID: [name], male    DOB: 06-02-1955, 67 y.o.   MRN: [id number]  HPI Pt comes in today for 6 week follow up. Pt would like to discuss medication as he stopped taking the last one that he was put on by Dr Gerda Diss  He denies being suicidal he does see a psychiatrist but the [place] he states medication really did not seem to help him it made him ruminate too much.  He is not interested in starting a different medicine. also blood pressure medication as his blood pressure has been consistently high for the past couple of weeks.  Moderate blood pressure elevation outside of office.  Pt also would like some recommendations for his hernia on the right side of lower abdomin.  Hernia issues right lower abdomen causing some pain and discomfort Patient is being seen by back specialist/pain management they have him set up for some shots  He is taking his diabetes medicine trying to eat healthy minimizing carbohydrates  Review of Systems     Objective:   Physical Exam General-in no acute distress Eyes-no discharge Lungs-respiratory rate normal, CTA CV-no murmurs,RRR Extremities skin warm dry no edema Neuro grossly normal Behavior normal, alert  Hernia noted right inguinal region not incarcerated      Assessment & Plan:  1. Benign prostatic hyperplasia with urinary frequency Patient would like to go back on Flomax 0.4 mg take 2 capsules each evening if any negative side effects to let us know - Ambulatory referral to General Surgery  2. DM type 2, controlled, with complication (HCC) The patient was seen today as part of a comprehensive visit for diabetes. The importance of keeping her A1c at or below 7 range was discussed.  Discussed diet, activity, and medication compliance Emphasized healthy eating primarily with vegetables fruits and if utilizing meats lean meats such as chicken or fish grilled baked broiled Avoid sugary drinks Minimize and avoid processed  foods Fit in regular physical activity preferably 25 to 30 minutes 4 times per week Standard follow-up visit recommended.  Patient aware lack of control and follow-up increases risk of diabetic complications. Regular follow-up visits Yearly ophthalmology Yearly foot exam  - Hemoglobin A1c  3. Hyperlipidemia associated with type 2 diabetes mellitus (HCC) Hyperlipidemia-importance of diet, weight control, activity, compliance with medications discussed.   Recent labs reviewed.   Any additional labs or refills ordered.   Importance of keeping under good control discussed. Regular follow-up visits discussed  - Lipid Panel  4. Primary hypertension HTN- patient seen for follow-up regarding HTN.   Diet, medication compliance, appropriate labs and refills were completed.   Importance of keeping blood pressure under good control to lessen the risk of complications discussed Regular follow-up visits discussed  - Basic Metabolic Panel  5. Right inguinal hernia Referral for surgery consideration and evaluation - Ambulatory referral to General Surgery  6. High risk medication use Today - Hepatic Function Panel  Follow-up 6 months

## 2023-01-08 ENCOUNTER — Encounter: Payer: Self-pay | Admitting: Family Medicine

## 2023-01-08 NOTE — Telephone Encounter (Signed)
Nurses I read over the patient's note  I would recommend increasing amlodipine. New dose-10 mg daily, #30 with 5 refills 1 daily previous dose was 5 mg  Also have Russ send me update regarding blood pressure within 3 weeks he has a follow-up visit in November.  We can follow-up sooner if he should have setbacks or problems  Should be noted that a small #5 or less can have swelling around the ankles associated with higher dose of amlodipine.  If negative side effects with medication please notify us.  Please share this note with [name] as well.  Our goal is to see the top number more like 130s over 70s or 80s

## 2023-01-09 ENCOUNTER — Other Ambulatory Visit: Payer: Self-pay

## 2023-01-09 MED ORDER — AMLODIPINE BESYLATE 10 MG PO TABS: 10.0000 mg | ORAL_TABLET | Freq: Every day | ORAL | 5 refills | Status: DC

## 2023-01-10 ENCOUNTER — Ambulatory Visit
Admission: RE | Admit: 2023-01-10 | Discharge: 2023-01-10 | Disposition: A | Discharge status: Home / Self Care | Payer: Medicare Other | Source: Ambulatory Visit | Attending: Student in an Organized Health Care Education/Training Program | Admitting: Student in an Organized Health Care Education/Training Program

## 2023-01-10 ENCOUNTER — Encounter: Payer: Self-pay | Admitting: Student in an Organized Health Care Education/Training Program

## 2023-01-10 ENCOUNTER — Ambulatory Visit: Payer: Medicare Other | Admitting: Student in an Organized Health Care Education/Training Program

## 2023-01-10 DIAGNOSIS — M4802 Spinal stenosis, cervical region: Secondary | ICD-10-CM | POA: Diagnosis not present

## 2023-01-10 DIAGNOSIS — M5412 Radiculopathy, cervical region: Secondary | ICD-10-CM | POA: Diagnosis not present

## 2023-01-10 DIAGNOSIS — M47812 Spondylosis without myelopathy or radiculopathy, cervical region: Secondary | ICD-10-CM | POA: Insufficient documentation

## 2023-01-10 DIAGNOSIS — M542 Cervicalgia: Secondary | ICD-10-CM | POA: Diagnosis not present

## 2023-01-10 DIAGNOSIS — M4803 Spinal stenosis, cervicothoracic region: Secondary | ICD-10-CM | POA: Diagnosis not present

## 2023-01-10 DIAGNOSIS — M5416 Radiculopathy, lumbar region: Secondary | ICD-10-CM | POA: Insufficient documentation

## 2023-01-10 DIAGNOSIS — G894 Chronic pain syndrome: Secondary | ICD-10-CM | POA: Insufficient documentation

## 2023-01-10 DIAGNOSIS — G8929 Other chronic pain: Secondary | ICD-10-CM

## 2023-01-10 DIAGNOSIS — Z981 Arthrodesis status: Secondary | ICD-10-CM | POA: Insufficient documentation

## 2023-01-10 DIAGNOSIS — M4722 Other spondylosis with radiculopathy, cervical region: Secondary | ICD-10-CM | POA: Diagnosis not present

## 2023-01-10 DIAGNOSIS — Z4789 Encounter for other orthopedic aftercare: Secondary | ICD-10-CM | POA: Diagnosis not present

## 2023-01-10 MED ORDER — DEXAMETHASONE SODIUM PHOSPHATE 10 MG/ML IJ SOLN
20.0000 mg | Freq: Once | INTRAMUSCULAR | Status: AC
  Administered 2023-01-10: 20 mg

## 2023-01-10 MED ORDER — LIDOCAINE HCL 2 % IJ SOLN
INTRAMUSCULAR | Status: AC
  Filled 2023-01-10: qty 20

## 2023-01-10 MED ORDER — SODIUM CHLORIDE (PF) 0.9 % IJ SOLN
INTRAMUSCULAR | Status: AC
  Filled 2023-01-10: qty 10

## 2023-01-10 MED ORDER — IOHEXOL 180 MG/ML  SOLN
INTRAMUSCULAR | Status: AC
  Filled 2023-01-10: qty 20

## 2023-01-10 MED ORDER — LIDOCAINE HCL 2 % IJ SOLN
20.0000 mL | Freq: Once | INTRAMUSCULAR | Status: AC
  Administered 2023-01-10: 400 mg

## 2023-01-10 MED ORDER — ROPIVACAINE HCL 2 MG/ML IJ SOLN
2.0000 mL | Freq: Once | INTRAMUSCULAR | Status: AC
  Administered 2023-01-10: 2 mL via EPIDURAL

## 2023-01-10 MED ORDER — DEXAMETHASONE SODIUM PHOSPHATE 10 MG/ML IJ SOLN
INTRAMUSCULAR | Status: AC
  Filled 2023-01-10: qty 1

## 2023-01-10 MED ORDER — DIAZEPAM 5 MG PO TABS
ORAL_TABLET | ORAL | Status: AC
  Filled 2023-01-10: qty 1

## 2023-01-10 MED ORDER — IOHEXOL 180 MG/ML  SOLN
10.0000 mL | Freq: Once | INTRAMUSCULAR | Status: AC
  Administered 2023-01-10: 10 mL via EPIDURAL

## 2023-01-10 MED ORDER — ROPIVACAINE HCL 2 MG/ML IJ SOLN
INTRAMUSCULAR | Status: AC
  Filled 2023-01-10: qty 20

## 2023-01-10 MED ORDER — DIAZEPAM 5 MG PO TABS
5.0000 mg | ORAL_TABLET | ORAL | Status: AC
  Administered 2023-01-10: 5 mg via ORAL

## 2023-01-10 MED ORDER — SODIUM CHLORIDE 0.9% FLUSH
2.0000 mL | Freq: Once | INTRAVENOUS | Status: AC
  Administered 2023-01-10: 2 mL

## 2023-01-10 NOTE — Progress Notes (Signed)
PROVIDER NOTE: Interpretation of information contained herein should be left to medically-trained personnel. Specific patient instructions are provided elsewhere under "Patient Instructions" section of medical record. This document was created in part using STT-dictation technology, any transcriptional errors that may result from this process are unintentional.  Patient: [name] Type: Established DOB: Jun 04, 1955 MRN: [id number] PCP: Babs Sciara, MD  Service: Procedure DOS: 01/10/2023 Setting: Ambulatory Location: Ambulatory outpatient facility Delivery: Face-to-face Provider: Edward Jolly, MD Specialty: Interventional Pain Management Specialty designation: 09 Location: Outpatient facility Ref. Prov.: Edward Jolly, MD       Interventional Therapy   Procedure: Lumbar trans-foraminal epidural steroid injection (L-TFESI) #1  Laterality: Left (-LT)  Level: L4 and L5 nerve root(s) Imaging: Fluoroscopy-guided         Anesthesia: Local anesthesia (1-2% Lidocaine) Anxiolysis: 5 mg Valium DOS: 01/10/2023  Performed by: Edward Jolly, MD  Purpose: Diagnostic/Therapeutic Indications: Lumbar radicular pain severe enough to impact quality of life or function. 1. Chronic radicular lumbar pain   2. Lumbar radiculopathy   3. Chronic pain syndrome    NAS-11 Pain score:   Pre-procedure: 5 /10   Post-procedure: 4 /10     Position / Prep / Materials:  Position: Prone  Prep solution: DuraPrep (Iodine Povacrylex [0.7% available iodine] and Isopropyl Alcohol, 74% w/w) Prep Area: Entire Posterior Lumbosacral Area.  From the lower tip of the scapula down to the tailbone and from flank to flank. Materials:  Tray: Block Needle(s):  Type: Spinal  Gauge (G): 22  Length: 3.5-in  Qty: 2      H&P (Pre-op Assessment):  [name] is a 67 y.o. (year old), male patient, seen today for interventional treatment. He  has a past surgical history that includes Anterior cervical decomp/discectomy  fusion; Knee surgery (Right); Colonoscopy w/ polypectomy (2011); Esophagogastroduodenoscopy; left heart catheterization with coronary angiogram (N/A, 10/17/2012); percutaneous coronary stent intervention (pci-s) (10/17/2012); left heart catheterization with coronary angiogram (N/A, 12/17/2012); permanent pacemaker insertion (N/A, 12/17/2012); left heart catheterization with coronary angiogram (N/A, 06/11/2013); Biventricular pacemaker upgrade/ system revision (04/02/2014); Colonoscopy (2017); Polypectomy (2017); Upper gastrointestinal endoscopy; Back surgery; Insert / replace / remove pacemaker (12/17/2012); LEFT HEART CATH AND CORONARY ANGIOGRAPHY (N/A, 08/22/2018); CORONARY STENT INTERVENTION (N/A, 08/22/2018); LEFT HEART CATH AND CORONARY ANGIOGRAPHY (N/A, 06/15/2020); CORONARY BALLOON ANGIOPLASTY (N/A, 06/15/2020); Coronary Ultrasound/IVUS (N/A, 06/15/2020); and LEFT HEART CATH AND CORONARY ANGIOGRAPHY (N/A, 09/01/2021). [name] has a current medication list which includes the following prescription(s): acetaminophen, alprazolam, amlodipine, aspirin ec, esomeprazole, hydromorphone, isosorbide mononitrate, losartan, metformin, nitroglycerin, ondansetron, propranolol er, rosuvastatin, tamsulosin, and ventolin hfa, and the following Facility-Administered Medications: sodium chloride flush. His primarily concern today is the Leg Pain (Left ) and Back Pain (Left lower )  Initial Vital Signs:  Pulse/HCG Rate: 64  Temp: (!) 97.3 F (36.3 C) Resp: 16 BP: (!) 156/94 SpO2: 98 %  BMI: Estimated body mass index is 28.76 kg/m as calculated from the following:   Height as of this encounter: 6\' 1"  (1.854 m).   Weight as of this encounter: 218 lb (98.9 kg).  Risk Assessment: Allergies: Reviewed. He is allergic to morphine and codeine, lasix [furosemide], and latex.  Allergy Precautions: None required Coagulopathies: Reviewed. None identified.  Blood-thinner therapy: None at this time Active Infection(s):  Reviewed. None identified. Mr. Neher is afebrile  Site Confirmation: [name] was asked to confirm the procedure and laterality before marking the site Procedure checklist: Completed Consent: Before the procedure and under the influence of no sedative(s), amnesic(s), or anxiolytics, the patient  was informed of the treatment options, risks and possible complications. To fulfill our ethical and legal obligations, as recommended by the American Medical Association's Code of Ethics, I have informed the patient of my clinical impression; the nature and purpose of the treatment or procedure; the risks, benefits, and possible complications of the intervention; the alternatives, including doing nothing; the risk(s) and benefit(s) of the alternative treatment(s) or procedure(s); and the risk(s) and benefit(s) of doing nothing. The patient was provided information about the general risks and possible complications associated with the procedure. These may include, but are not limited to: failure to achieve desired goals, infection, bleeding, organ or nerve damage, allergic reactions, paralysis, and death. In addition, the patient was informed of those risks and complications associated to Spine-related procedures, such as failure to decrease pain; infection (i.e.: Meningitis, epidural or intraspinal abscess); bleeding (i.e.: epidural hematoma, subarachnoid hemorrhage, or any other type of intraspinal or peri-dural bleeding); organ or nerve damage (i.e.: Any type of peripheral nerve, nerve root, or spinal cord injury) with subsequent damage to sensory, motor, and/or autonomic systems, resulting in permanent pain, numbness, and/or weakness of one or several areas of the body; allergic reactions; (i.e.: anaphylactic reaction); and/or death. Furthermore, the patient was informed of those risks and complications associated with the medications. These include, but are not limited to: allergic reactions (i.e.: anaphylactic  or anaphylactoid reaction(s)); adrenal axis suppression; blood sugar elevation that in diabetics may result in ketoacidosis or comma; water retention that in patients with history of congestive heart failure may result in shortness of breath, pulmonary edema, and decompensation with resultant heart failure; weight gain; swelling or edema; medication-induced neural toxicity; particulate matter embolism and blood vessel occlusion with resultant organ, and/or nervous system infarction; and/or aseptic necrosis of one or more joints. Finally, the patient was informed that Medicine is not an exact science; therefore, there is also the possibility of unforeseen or unpredictable risks and/or possible complications that may result in a catastrophic outcome. The patient indicated having understood very clearly. We have given the patient no guarantees and we have made no promises. Enough time was given to the patient to ask questions, all of which were answered to the patient's satisfaction. Mr. Voorhees has indicated that he wanted to continue with the procedure. Attestation: I, the ordering provider, attest that I have discussed with the patient the benefits, risks, side-effects, alternatives, likelihood of achieving goals, and potential problems during recovery for the procedure that I have provided informed consent. Date  Time: 01/10/2023 12:42 PM   Pre-Procedure Preparation:  Monitoring: As per clinic protocol. Respiration, ETCO2, SpO2, BP, heart rate and rhythm monitor placed and checked for adequate function Safety Precautions: Patient was assessed for positional comfort and pressure points before starting the procedure. Time-out: I initiated and conducted the "Time-out" before starting the procedure, as per protocol. The patient was asked to participate by confirming the accuracy of the "Time Out" information. Verification of the correct person, site, and procedure were performed and confirmed by me, the nursing  staff, and the patient. "Time-out" conducted as per Joint Commission's Universal Protocol (UP.01.01.01). Time: 1321 Start Time: 1321 hrs.  Description/Narrative of Procedure:          Target: The 6 o'clock position under the pedicle, on the affected side. Region: Posterolateral Lumbosacral Approach: Posterior Percutaneous Paravertebral approach.  Rationale (medical necessity): procedure needed and proper for the diagnosis and/or treatment of the patient's medical symptoms and needs. Procedural Technique Safety Precautions: Aspiration looking for blood return was  conducted prior to all injections. At no point did we inject any substances, as a needle was being advanced. No attempts were made at seeking any paresthesias. Safe injection practices and needle disposal techniques used. Medications properly checked for expiration dates. SDV (single dose vial) medications used. Description of the Procedure: Protocol guidelines were followed. The patient was placed in position over the procedure table. The target area was identified and the area prepped in the usual manner. Skin & deeper tissues infiltrated with local anesthetic. Appropriate amount of time allowed to pass for local anesthetics to take effect. The procedure needles were then advanced to the target area. Proper needle placement secured. Negative aspiration confirmed. Solution injected in intermittent fashion, asking for systemic symptoms every 0.5cc of injectate. The needles were then removed and the area cleansed, making sure to leave some of the prepping solution back to take advantage of its long term bactericidal properties.  Vitals:   01/10/23 1316 01/10/23 1321 01/10/23 1326 01/10/23 1331  BP: (!) 140/97 (!) 144/102 (!) 147/96 (!) 151/96  Pulse: 72 76 72 70  Resp: 16 18 20 16   Temp:      TempSrc:      SpO2: 96% 98% 97% 98%  Weight:      Height:        Start Time: 1321 hrs. End Time: 1327 hrs.  Imaging Guidance (Spinal):           Type of Imaging Technique: Fluoroscopy Guidance (Spinal) Indication(s): Assistance in needle guidance and placement for procedures requiring needle placement in or near specific anatomical locations not easily accessible without such assistance. Exposure Time: Please see nurses notes. Contrast: Before injecting any contrast, we confirmed that the patient did not have an allergy to iodine, shellfish, or radiological contrast. Once satisfactory needle placement was completed at the desired level, radiological contrast was injected. Contrast injected under live fluoroscopy. No contrast complications. See chart for type and volume of contrast used. Fluoroscopic Guidance: I was personally present during the use of fluoroscopy. "Tunnel Vision Technique" used to obtain the best possible view of the target area. Parallax error corrected before commencing the procedure. "Direction-depth-direction" technique used to introduce the needle under continuous pulsed fluoroscopy. Once target was reached, antero-posterior, oblique, and lateral fluoroscopic projection used confirm needle placement in all planes. Images permanently stored in EMR. Interpretation: I personally interpreted the imaging intraoperatively. Adequate needle placement confirmed in multiple planes. Appropriate spread of contrast into desired area was observed. No evidence of afferent or efferent intravascular uptake. No intrathecal or subarachnoid spread observed. Permanent images saved into the patient's record.  Post-operative Assessment:  Post-procedure Vital Signs:  Pulse/HCG Rate: 70  Temp: (!) 97.3 F (36.3 C) Resp: 16 BP: (!) 151/96 SpO2: 98 %  EBL: None  Complications: No immediate post-treatment complications observed by team, or reported by patient.  Note: The patient tolerated the entire procedure well. A repeat set of vitals were taken after the procedure and the patient was kept under observation following institutional policy,  for this type of procedure. Post-procedural neurological assessment was performed, showing return to baseline, prior to discharge. The patient was provided with post-procedure discharge instructions, including a section on how to identify potential problems. Should any problems arise concerning this procedure, the patient was given instructions to immediately contact us, at any time, without hesitation. In any case, we plan to contact the patient by telephone for a follow-up status report regarding this interventional procedure.  Comments:  No additional relevant information.  Plan  of Care (POC)  Orders:  Orders Placed This Encounter  Procedures   DG PAIN CLINIC C-ARM 1-60 MIN NO REPORT    Intraoperative interpretation by procedural physician at [facility].    Standing Status:   Standing    Number of Occurrences:   1    Order Specific Question:   Reason for exam:    Answer:   Assistance in needle guidance and placement for procedures requiring needle placement in or near specific anatomical locations not easily accessible without such assistance.     Medications ordered for procedure: Meds ordered this encounter  Medications   iohexol (OMNIPAQUE) 180 MG/ML injection 10 mL    Must be Myelogram-compatible. If not available, you may substitute with a water-soluble, non-ionic, hypoallergenic, myelogram-compatible radiological contrast medium.   lidocaine (XYLOCAINE) 2 % (with pres) injection 400 mg   diazepam (VALIUM) tablet 5 mg    Make sure Flumazenil is available in the pyxis when using this medication. If oversedation occurs, administer 0.2 mg IV over 15 sec. If after 45 sec no response, administer 0.2 mg again over 1 min; may repeat at 1 min intervals; not to exceed 4 doses (1 mg)   ropivacaine (PF) 2 mg/mL (0.2%) (NAROPIN) injection 2 mL    This is for a two (2) level block. Use two (2) syringes and divide content in half.   sodium chloride flush (NS) 0.9 % injection 2 mL     This is for a two (2) level block. Use two (2) syringes and divide content in half.   dexamethasone (DECADRON) injection 20 mg    This is for a two (2) level block. Use two (2) syringes and divide content in half.   Medications administered: We administered iohexol, lidocaine, diazepam, ropivacaine (PF) 2 mg/mL (0.2%), sodium chloride flush, and dexamethasone.  See the medical record for exact dosing, route, and time of administration.  Follow-up plan:   Return in about 4 weeks (around 02/07/2023), or 4- 5 weeks F2F PPE.       Left L4 and L5 TF ESI 01/10/23    Recent Visits Date Type Provider Dept  12/26/22 Office Visit Edward Jolly, MD [facility]  Showing recent visits within past 90 days and meeting all other requirements Today's Visits Date Type Provider Dept  01/10/23 Procedure visit Edward Jolly, MD [facility]  Showing today's visits and meeting all other requirements Future Appointments Date Type Provider Dept  02/07/23 Appointment Edward Jolly, MD [facility]  Showing future appointments within next 90 days and meeting all other requirements  Disposition: Discharge home  Discharge (Date  Time): 01/10/2023; 1334 hrs.   Primary Care Physician: Babs Sciara, MD Location: [facility] Note by: Edward Jolly, MD (TTS technology used. I apologize for any typographical errors that were not detected and corrected.) Date: 01/10/2023; Time: 1:34 PM  Disclaimer:  Medicine is not an Visual merchandiser. The only guarantee in medicine is that nothing is guaranteed. It is important to note that the decision to proceed with this intervention was based on the information collected from the patient. The Data and conclusions were drawn from the patient's questionnaire, the interview, and the physical examination. Because the information was provided in large part by the patient, it cannot be guaranteed that it has not been purposely  or unconsciously manipulated. Every effort has been made to obtain as much relevant data as possible for this evaluation. It is important to note that the conclusions  that lead to this procedure are derived in large part from the available data. Always take into account that the treatment will also be dependent on availability of resources and existing treatment guidelines, considered by other Pain Management Practitioners as being common knowledge and practice, at the time of the intervention. For Medico-Legal purposes, it is also important to point out that variation in procedural techniques and pharmacological choices are the acceptable norm. The indications, contraindications, technique, and results of the above procedure should only be interpreted and judged by a Board-Certified Interventional Pain Specialist with extensive familiarity and expertise in the same exact procedure and technique.

## 2023-01-10 NOTE — Progress Notes (Signed)
Safety precautions to be maintained throughout the outpatient stay will include: orient to surroundings, keep bed in low position, maintain call bell within reach at all times, provide assistance with transfer out of bed and ambulation.  

## 2023-01-10 NOTE — Patient Instructions (Signed)

## 2023-01-11 ENCOUNTER — Ambulatory Visit: Payer: Medicare Other

## 2023-01-11 ENCOUNTER — Telehealth: Payer: Self-pay | Admitting: Cardiology

## 2023-01-11 ENCOUNTER — Telehealth: Payer: Self-pay

## 2023-01-11 DIAGNOSIS — K4020 Bilateral inguinal hernia, without obstruction or gangrene, not specified as recurrent: Secondary | ICD-10-CM | POA: Diagnosis not present

## 2023-01-11 NOTE — Telephone Encounter (Signed)
Patient has follow-up with Sharlene Dory NP tomorrow.  Will defer cardiac clearance to office visit

## 2023-01-11 NOTE — Telephone Encounter (Signed)
Talked with patient PP, states he is doing better, but is having some pain, instructed on heat today and to wait for the steroid to start working and MD would call on scheduled appt.

## 2023-01-11 NOTE — Telephone Encounter (Signed)
Pre-operative Risk Assessment    Patient Name: [name]  DOB: 1955-08-18 MRN: [id number]      Request for Surgical Clearance    Procedure:   Inguinal hernia surgery  Date of Surgery:  Clearance TBD                                 Surgeon:  Melody Haver, MD Surgeon's Group or Practice Name:  [facility] Phone number:  [phone] Fax number:  [phone] attn Trellis Moment, CMA   Type of Clearance Requested:   - Medical  - Pharmacy:  Hold If applicable      Type of Anesthesia:  General    Additional requests/questions:    SignedSeymour Bars   01/11/2023, 12:56 PM

## 2023-01-12 ENCOUNTER — Encounter: Payer: Self-pay | Admitting: Nurse Practitioner

## 2023-01-12 ENCOUNTER — Ambulatory Visit: Payer: Medicare Other | Attending: Cardiology | Admitting: Nurse Practitioner

## 2023-01-12 VITALS — BP 138/78 | HR 71 | Ht 71.0 in | Wt 229.0 lb

## 2023-01-12 DIAGNOSIS — R29818 Other symptoms and signs involving the nervous system: Secondary | ICD-10-CM

## 2023-01-12 DIAGNOSIS — I25119 Atherosclerotic heart disease of native coronary artery with unspecified angina pectoris: Secondary | ICD-10-CM

## 2023-01-12 DIAGNOSIS — R079 Chest pain, unspecified: Secondary | ICD-10-CM

## 2023-01-12 DIAGNOSIS — R0609 Other forms of dyspnea: Secondary | ICD-10-CM

## 2023-01-12 DIAGNOSIS — I10 Essential (primary) hypertension: Secondary | ICD-10-CM

## 2023-01-12 DIAGNOSIS — Z01810 Encounter for preprocedural cardiovascular examination: Secondary | ICD-10-CM

## 2023-01-12 DIAGNOSIS — I442 Atrioventricular block, complete: Secondary | ICD-10-CM

## 2023-01-12 DIAGNOSIS — Z950 Presence of cardiac pacemaker: Secondary | ICD-10-CM

## 2023-01-12 DIAGNOSIS — E785 Hyperlipidemia, unspecified: Secondary | ICD-10-CM

## 2023-01-12 MED ORDER — RANOLAZINE ER 500 MG PO TB12: 500.0000 mg | ORAL_TABLET | Freq: Two times a day (BID) | ORAL | 6 refills | Status: AC

## 2023-01-12 NOTE — Progress Notes (Unsigned)
Cardiology Office Note:  .   Date:  01/12/2023 ID:  LETICIA MCDIARMID, DOB 26-Jul-1955, MRN [id number] PCP: Babs Sciara, MD  [facility] Providers Cardiologist:  Dina Rich, MD Electrophysiologist:  Maurice Small, MD    History of Present Illness: .   [name] is a 67 y.o. male with a PMH of hx of chest pain, CAD, palpitations, hypertension, hyperlipidemia, orthostatic hypotension, and CHB, status post PPM, PTSD, who presents today for preoperative cardiovascular risk assessment.  Previous cardiovascular history of PCI to RCA in 2014.  Drug-eluting stent to LAD in 2020.  TTE in 2021 revealed EF 60 to 65%, grade 1 DD.  NST in 2021 revealed small apical infarct with mild peri-infarct ischemia.  Underwent cardiac catheterization in 2022, revealed patent LAD stent, proximal LAD 50%, left circumflex 85% distal, RCA 75% with stent ISR, had PTCA of the RCA ISR, from interventional could hold Plavix for 30 days if needed.  See monitor report from 07/2021 below.  In May 2023 he underwent cardiac catheterization that revealed proximal LAD 50%, left circumflex 30% with patent stents to the RCA.  Last seen by Dr. Dina Rich on May 18, 2022.  Overall he was doing well.  Patient did note diffuse leg pains at night and was felt to be unclear if statin related, but did improve with lowering Crestor to 10 mg daily.  Blood pressure was elevated in the office as he had not taken his medications that day.  Saw Dr. Nelly Laurence on Aug 16, 2022 for PVCs, was still very symptomatic.  Was started on amiodarone.  Our office has been contacted regarding clearance for inguinal hernia surgery they will be performed by Dr. Melody Haver of [facility].  Today he presents for preoperative cardiovascular risk assessment.  He states he is also here for a scheduled follow-up and for chest pain evaluation. Says he did not start Amiodarone as he was concerned about the side effects. Admits  to recent onset of shortness of breath with exertion in the past 2 weeks, noticed when walking down his driveway. Endorses episode of angina since no longer on Ranexa even though this has helped his palpitations, says he has had a history of chronic angina. CP is located across his chest and radiates to his neck, described as sharp, and into his back. Has to stop and rest for pain to be relieved. Says he has had similar chest pain before, and Ranexa helped in the past, requesting to restart medication. Does appear to be getting worse. Has not taken any NTG per his report. Also admits to waking up out of breath, STOP-BANG Score is 5. Last sleep study was a long time ago.   ROS: Negative. See HPI.   Studies Reviewed: Marland Kitchen    EKG:  EKG Interpretation Date/Time:  Friday January 12 2023 16:02:22 EDT Ventricular Rate:  66 PR Interval:  148 QRS Duration:  144 QT Interval:  454 QTC Calculation: 475 R Axis:   -37  Text Interpretation: Atrial-sensed ventricular-paced rhythm When compared with ECG of 22-Feb-2022 08:52, PREVIOUS ECG IS PRESENT Confirmed by Sharlene Dory [phone] on 01/12/2023 4:04:33 PM   Echo 04/07/2022: 1. Left ventricular ejection fraction, by estimation, is 60 to 65%. The  left ventricle has normal function. The left ventricle has no regional  wall motion abnormalities. There is mild left ventricular hypertrophy.  Left ventricular diastolic parameters  are indeterminate. The average left ventricular global longitudinal strain  is -17.8 %. The global  longitudinal strain is normal.   2. Right ventricular systolic function is normal. The right ventricular  size is normal. Tricuspid regurgitation signal is inadequate for assessing  PA pressure.   3. The mitral valve is normal in structure. Trivial mitral valve  regurgitation. No evidence of mitral stenosis.   4. The tricuspid valve is abnormal.   5. The aortic valve is tricuspid. There is moderate calcification of the  aortic  valve. There is moderate thickening of the aortic valve. Aortic  valve regurgitation is not visualized. No aortic stenosis is present.   6. The inferior vena cava is dilated in size with >50% respiratory  variability, suggesting right atrial pressure of 8 mmHg.   Comparison(s): Echocardiogram done 06/20/19 showed an EF of 60-65%.  LHC:   Prox LAD lesion is 50% stenosed.   Prox Cx lesion is 10% stenosed.   Ost Cx lesion is 10% stenosed.   Dist Cx lesion is 30% stenosed.   Mid RCA lesion is 5% stenosed.   Non-stenotic Mid LAD lesion was previously treated.   The left ventricular systolic function is normal.   LV end diastolic pressure is normal.   The left ventricular ejection fraction is greater than 65% by visual estimate.   Mild -moderate CAD with previously noted smooth 50% LAD stenosis after the first diagonal and septal perforating artery with a widely patent mid LAD stent; mild nonobstructive left circumflex stenosis in a co-dominant vessel; and superior takeoff RCA with smooth very mild 5% intimal hyperplasia within the mid RCA stent.   Hyperdynamic LV function with EF estimated at least 65%.  LVEDP 11 mmHg.   RECOMMENDATION: Medical therapy.  Cardiac monitor 08/2021: 2 day monitor Frequent supraventricular ectopy in the form of PACs Rare ventricular ectopy as isolated PVCs, couplets. Isolated 6 beat run of NSVT.     Patch Wear Time:  2 days and 19 hours (2023-04-17T10:35:43-0400 to 2023-04-20T06:31:22-0400)   Patient had a min HR of 58 bpm, max HR of 184 bpm, and avg HR of 63 bpm. Predominant underlying rhythm was Possible Ventricular Pacing. 1 run of Ventricular Tachycardia occurred lasting 6 beats with a max rate of 184 bpm (avg 165 bpm). Isolated SVEs were  frequent (9.1%, 23248), and no SVE Couplets or SVE Triplets were present. Isolated VEs were rare (<1.0%), VE Couplets were rare (<1.0%), and no VE Triplets were present.  Lexiscan 09/2019: Findings consistent with prior  small apical myocardial infarction with mild peri-infarct ischemia. This is an intermediate risk study. Risk is based on decreased LVEF, consider correlating LVEF with echo. The left ventricular ejection fraction is mildly decreased (43%).  Risk Assessment/Calculations:    Stop-Bang Score 5 Physical Exam:   VS:  BP 138/78   Pulse 71   Ht 5\' 11"  (1.803 m)   Wt 229 lb (103.9 kg)   SpO2 93%   BMI 31.94 kg/m    Wt Readings from Last 3 Encounters:  01/12/23 229 lb (103.9 kg)  01/10/23 218 lb (98.9 kg)  12/27/22 221 lb 3.2 oz (100.3 kg)    GEN: Well nourished, well developed in no acute distress NECK: No JVD; No carotid bruits CARDIAC: S1/S2, RRR, Grade 1/6 murmur, no rubs, no gallops RESPIRATORY:  Clear to auscultation without rales, wheezing or rhonchi  ABDOMEN: Soft, non-tender, non-distended EXTREMITIES:  No edema; No deformity   ASSESSMENT AND PLAN: .    CAD, chest pain, DOE Admits to recent onset of chest pain and shortness of breath with exertion, requesting to restart  Ranexa. LHC report noted above, medical therapy recommended. Patient requests to return to taking Ranexa and say he is willing to restart Ranexa as this helped his symptoms in the past, reports he did have some palpitations on this medication, I told him to let us know if he notices any symptoms. He verbalized understanding and is agreeable with treatment plan. EKG reassuring today. Will begin Ranexa 500 mg BID. No other medication changes at this time. Echo 03/2022 benign. Heart healthy diet and regular cardiovascular exercise encouraged. Care and ED precautions discussed.   HTN BP stable and at goal. Discussed to monitor BP at home at least 2 hours after medications and sitting for 5-10 minutes. Continue current medication regimen. Heart healthy diet and regular cardiovascular exercise encouraged.   HLD LDL 82 08/2022. Continue rosuvastatin. Has upcoming pending labs. Heart healthy diet and regular cardiovascular  exercise encouraged.   CHB, s/p PPM Most recent remote device check showed normal device function. Continue to follow-up with EP.   Suspected sleep apnea Stop-Bang Score 5. Reports some symptoms of sleep apnea. Plan to address and arrange home sleep study at future office visit per patient's request.   Pre-operative cardiovascular clearance Not addressed at today's visit d/t patient's chief complaints of chest pain and DOE - see above. Will bring patient back for close follow-up and address at next office visit if CP and shortness of breath are resolved and improved.   Dispo: Follow-up with me/APP in 4-6 weeks or sooner if anything changes.   Signed, Sharlene Dory, NP

## 2023-01-12 NOTE — Patient Instructions (Signed)
Medication Instructions:   Begin Ranexa 500mg  twice a day   Continue all other medications.     Labwork:  none  Testing/Procedures:  none  Follow-Up:  4-6 weeks    Any Other Special Instructions Will Be Listed Below (If Applicable).   If you need a refill on your cardiac medications before your next appointment, please call your pharmacy.

## 2023-01-16 DIAGNOSIS — F4312 Post-traumatic stress disorder, chronic: Secondary | ICD-10-CM | POA: Diagnosis not present

## 2023-01-18 DIAGNOSIS — E1169 Type 2 diabetes mellitus with other specified complication: Secondary | ICD-10-CM | POA: Diagnosis not present

## 2023-01-18 DIAGNOSIS — I10 Essential (primary) hypertension: Secondary | ICD-10-CM | POA: Diagnosis not present

## 2023-01-18 DIAGNOSIS — E785 Hyperlipidemia, unspecified: Secondary | ICD-10-CM | POA: Diagnosis not present

## 2023-01-18 DIAGNOSIS — E118 Type 2 diabetes mellitus with unspecified complications: Secondary | ICD-10-CM | POA: Diagnosis not present

## 2023-01-18 DIAGNOSIS — Z79899 Other long term (current) drug therapy: Secondary | ICD-10-CM | POA: Diagnosis not present

## 2023-01-19 ENCOUNTER — Ambulatory Visit (INDEPENDENT_AMBULATORY_CARE_PROVIDER_SITE_OTHER): Payer: Medicare Other

## 2023-01-19 DIAGNOSIS — I442 Atrioventricular block, complete: Secondary | ICD-10-CM

## 2023-01-19 LAB — BASIC METABOLIC PANEL
BUN/Creatinine Ratio: 19 (ref 10–24)
BUN: 21 mg/dL (ref 8–27)
CO2: 26 mmol/L (ref 20–29)
Calcium: 10.1 mg/dL (ref 8.6–10.2)
Chloride: 101 mmol/L (ref 96–106)
Creatinine, Ser: 1.09 mg/dL (ref 0.76–1.27)
Glucose: 157 mg/dL — ABNORMAL HIGH (ref 70–99)
Potassium: 4.5 mmol/L (ref 3.5–5.2)
Sodium: 141 mmol/L (ref 134–144)
eGFR: 74 mL/min/{1.73_m2} (ref >59)

## 2023-01-19 LAB — LIPID PANEL
Chol/HDL Ratio: 2.3 {ratio} (ref 0.0–5.0)
Cholesterol, Total: 145 mg/dL (ref 100–199)
HDL: 62 mg/dL (ref >39)
LDL Chol Calc (NIH): 65 mg/dL (ref 0–99)
Triglycerides: 98 mg/dL (ref 0–149)
VLDL Cholesterol Cal: 18 mg/dL (ref 5–40)

## 2023-01-19 LAB — HEMOGLOBIN A1C
Est. average glucose Bld gHb Est-mCnc: 146 mg/dL
Hgb A1c MFr Bld: 6.7 % — ABNORMAL HIGH (ref 4.8–5.6)

## 2023-01-19 LAB — HEPATIC FUNCTION PANEL
ALT: 22 [IU]/L (ref 0–44)
AST: 18 [IU]/L (ref 0–40)
Albumin: 4.9 g/dL (ref 3.9–4.9)
Alkaline Phosphatase: 61 [IU]/L (ref 44–121)
Bilirubin Total: 0.8 mg/dL (ref 0.0–1.2)
Bilirubin, Direct: 0.21 mg/dL (ref 0.00–0.40)
Total Protein: 6.9 g/dL (ref 6.0–8.5)

## 2023-01-29 ENCOUNTER — Ambulatory Visit (INDEPENDENT_AMBULATORY_CARE_PROVIDER_SITE_OTHER): Payer: Medicare Other

## 2023-01-29 DIAGNOSIS — I442 Atrioventricular block, complete: Secondary | ICD-10-CM | POA: Diagnosis not present

## 2023-01-30 LAB — CUP PACEART REMOTE DEVICE CHECK
Battery Remaining Longevity: 9 mo
Battery Voltage: 2.86 V
Brady Statistic AP VP Percent: 30.42 %
Brady Statistic AP VS Percent: 0.01 %
Brady Statistic AS VP Percent: 69.53 %
Brady Statistic AS VS Percent: 0.03 %
Brady Statistic RA Percent Paced: 30.41 %
Brady Statistic RV Percent Paced: 99.9 %
Date Time Interrogation Session: 20241014091529
Implantable Lead Connection Status: 753985
Implantable Lead Connection Status: 753985
Implantable Lead Connection Status: 753985
Implantable Lead Implant Date: 20151217
Implantable Lead Implant Date: 20151217
Implantable Lead Implant Date: 20151217
Implantable Lead Location: 753858
Implantable Lead Location: 753859
Implantable Lead Location: 753860
Implantable Lead Model: 4196
Implantable Lead Model: 5076
Implantable Lead Model: 5076
Implantable Pulse Generator Implant Date: 20151217
Lead Channel Impedance Value: 1007 Ohm
Lead Channel Impedance Value: 361 Ohm
Lead Channel Impedance Value: 380 Ohm
Lead Channel Impedance Value: 399 Ohm
Lead Channel Impedance Value: 418 Ohm
Lead Channel Impedance Value: 570 Ohm
Lead Channel Impedance Value: 608 Ohm
Lead Channel Impedance Value: 665 Ohm
Lead Channel Impedance Value: 722 Ohm
Lead Channel Pacing Threshold Amplitude: 0.75 V
Lead Channel Pacing Threshold Amplitude: 0.75 V
Lead Channel Pacing Threshold Amplitude: 1 V
Lead Channel Pacing Threshold Pulse Width: 0.4 ms
Lead Channel Pacing Threshold Pulse Width: 0.4 ms
Lead Channel Pacing Threshold Pulse Width: 0.4 ms
Lead Channel Sensing Intrinsic Amplitude: 1.125 mV
Lead Channel Sensing Intrinsic Amplitude: 1.125 mV
Lead Channel Sensing Intrinsic Amplitude: 8.25 mV
Lead Channel Sensing Intrinsic Amplitude: 8.25 mV
Lead Channel Setting Pacing Amplitude: 2 V
Lead Channel Setting Pacing Amplitude: 2.25 V
Lead Channel Setting Pacing Amplitude: 2.5 V
Lead Channel Setting Pacing Pulse Width: 0.4 ms
Lead Channel Setting Pacing Pulse Width: 0.4 ms
Lead Channel Setting Sensing Sensitivity: 0.9 mV
Zone Setting Status: 755011
Zone Setting Status: 755011

## 2023-02-02 NOTE — Progress Notes (Signed)
Remote pacemaker transmission.   

## 2023-02-03 ENCOUNTER — Other Ambulatory Visit: Payer: Self-pay | Admitting: Family Medicine

## 2023-02-07 ENCOUNTER — Ambulatory Visit: Payer: Medicare Other | Admitting: Student in an Organized Health Care Education/Training Program

## 2023-02-12 ENCOUNTER — Encounter: Payer: Self-pay | Admitting: Nurse Practitioner

## 2023-02-12 ENCOUNTER — Ambulatory Visit: Payer: Medicare Other | Attending: Nurse Practitioner | Admitting: Nurse Practitioner

## 2023-02-12 VITALS — BP 128/76 | HR 65 | Ht 71.0 in | Wt 223.2 lb

## 2023-02-12 DIAGNOSIS — I2510 Atherosclerotic heart disease of native coronary artery without angina pectoris: Secondary | ICD-10-CM | POA: Diagnosis not present

## 2023-02-12 DIAGNOSIS — I10 Essential (primary) hypertension: Secondary | ICD-10-CM | POA: Diagnosis not present

## 2023-02-12 DIAGNOSIS — E785 Hyperlipidemia, unspecified: Secondary | ICD-10-CM | POA: Diagnosis not present

## 2023-02-12 DIAGNOSIS — Z950 Presence of cardiac pacemaker: Secondary | ICD-10-CM | POA: Diagnosis not present

## 2023-02-12 DIAGNOSIS — Z01810 Encounter for preprocedural cardiovascular examination: Secondary | ICD-10-CM

## 2023-02-12 NOTE — Patient Instructions (Signed)
Medication Instructions:  Continue all current medications.   Labwork: none  Testing/Procedures: none  Follow-Up: 6 months   Any Other Special Instructions Will Be Listed Below (If Applicable).   If you need a refill on your cardiac medications before your next appointment, please call your pharmacy.  

## 2023-02-12 NOTE — Progress Notes (Unsigned)
Cardiology Office Note:  .   Date:  02/12/2023 ID:  [name], DOB 11/18/1955, MRN [id number] PCP: Babs Sciara, MD  [facility] Providers Cardiologist:  Dina Rich, MD Electrophysiologist:  Maurice Small, MD    History of Present Illness: .   [name] is a 67 y.o. male with a PMH of hx of chest pain, CAD, palpitations, hypertension, hyperlipidemia, orthostatic hypotension, and CHB, status post PPM, PTSD, who presents today for preoperative cardiovascular risk assessment.  Previous cardiovascular history of PCI to RCA in 2014.  Drug-eluting stent to LAD in 2020.  TTE in 2021 revealed EF 60 to 65%, grade 1 DD.  NST in 2021 revealed small apical infarct with mild peri-infarct ischemia.  Underwent cardiac catheterization in 2022, revealed patent LAD stent, proximal LAD 50%, left circumflex 85% distal, RCA 75% with stent ISR, had PTCA of the RCA ISR, from interventional could hold Plavix for 30 days if needed.  See monitor report from 07/2021 below.  In May 2023 he underwent cardiac catheterization that revealed proximal LAD 50%, left circumflex 30% with patent stents to the RCA.  Last seen by Dr. Dina Rich on May 18, 2022.  Overall he was doing well.  Patient did note diffuse leg pains at night and was felt to be unclear if statin related, but did improve with lowering Crestor to 10 mg daily.  Blood pressure was elevated in the office as he had not taken his medications that day.  Saw Dr. Nelly Laurence on Aug 16, 2022 for PVCs, was still very symptomatic.  Was started on amiodarone.  01/12/2023 - Our office has been contacted regarding clearance for inguinal hernia surgery they will be performed by Dr. Melody Haver of [facility] surgery.  Today he presents for preoperative cardiovascular risk assessment.  He states he is also here for a scheduled follow-up and for chest pain evaluation. Says he did not start Amiodarone as he was concerned about the side  effects. Admits to recent onset of shortness of breath with exertion in the past 2 weeks, noticed when walking down his driveway. Endorses episode of angina since no longer on Ranexa even though this has helped his palpitations, says he has had a history of chronic angina. CP is located across his chest and radiates to his neck, described as sharp, and into his back. Has to stop and rest for pain to be relieved. Says he has had similar chest pain before, and Ranexa helped in the past, requesting to restart medication. Does appear to be getting worse. Has not taken any NTG per his report. Also admits to waking up out of breath, STOP-BANG Score is 5. Last sleep study was a long time ago.   02/12/2023 - Presents today for follow-up. Doing well. Denies any chest pain. Denies any acute cardiac complaints or concerns. Denies any chest pain, shortness of breath, palpitations, syncope, presyncope, dizziness, orthopnea, PND, swelling or significant weight changes, acute bleeding, or claudication.   ROS: Negative. See HPI.   Studies Reviewed: Marland Kitchen    EKG: Atrial sensed V-paced rhythm, 66 bpm.   Echo 04/07/2022: 1. Left ventricular ejection fraction, by estimation, is 60 to 65%. The  left ventricle has normal function. The left ventricle has no regional  wall motion abnormalities. There is mild left ventricular hypertrophy.  Left ventricular diastolic parameters  are indeterminate. The average left ventricular global longitudinal strain  is -17.8 %. The global longitudinal strain is normal.   2. Right ventricular systolic  function is normal. The right ventricular  size is normal. Tricuspid regurgitation signal is inadequate for assessing  PA pressure.   3. The mitral valve is normal in structure. Trivial mitral valve  regurgitation. No evidence of mitral stenosis.   4. The tricuspid valve is abnormal.   5. The aortic valve is tricuspid. There is moderate calcification of the  aortic valve. There is  moderate thickening of the aortic valve. Aortic  valve regurgitation is not visualized. No aortic stenosis is present.   6. The inferior vena cava is dilated in size with >50% respiratory  variability, suggesting right atrial pressure of 8 mmHg.   Comparison(s): Echocardiogram done 06/20/19 showed an EF of 60-65%.  LHC:   Prox LAD lesion is 50% stenosed.   Prox Cx lesion is 10% stenosed.   Ost Cx lesion is 10% stenosed.   Dist Cx lesion is 30% stenosed.   Mid RCA lesion is 5% stenosed.   Non-stenotic Mid LAD lesion was previously treated.   The left ventricular systolic function is normal.   LV end diastolic pressure is normal.   The left ventricular ejection fraction is greater than 65% by visual estimate.   Mild -moderate CAD with previously noted smooth 50% LAD stenosis after the first diagonal and septal perforating artery with a widely patent mid LAD stent; mild nonobstructive left circumflex stenosis in a co-dominant vessel; and superior takeoff RCA with smooth very mild 5% intimal hyperplasia within the mid RCA stent.   Hyperdynamic LV function with EF estimated at least 65%.  LVEDP 11 mmHg.   RECOMMENDATION: Medical therapy.  Cardiac monitor 08/2021: 2 day monitor Frequent supraventricular ectopy in the form of PACs Rare ventricular ectopy as isolated PVCs, couplets. Isolated 6 beat run of NSVT.     Patch Wear Time:  2 days and 19 hours (2023-04-17T10:35:43-0400 to 2023-04-20T06:31:22-0400)   Patient had a min HR of 58 bpm, max HR of 184 bpm, and avg HR of 63 bpm. Predominant underlying rhythm was Possible Ventricular Pacing. 1 run of Ventricular Tachycardia occurred lasting 6 beats with a max rate of 184 bpm (avg 165 bpm). Isolated SVEs were  frequent (9.1%, 23248), and no SVE Couplets or SVE Triplets were present. Isolated VEs were rare (<1.0%), VE Couplets were rare (<1.0%), and no VE Triplets were present.  Lexiscan 09/2019: Findings consistent with prior small apical  myocardial infarction with mild peri-infarct ischemia. This is an intermediate risk study. Risk is based on decreased LVEF, consider correlating LVEF with echo. The left ventricular ejection fraction is mildly decreased (43%).  Risk Assessment/Calculations:    Stop-Bang Score 5 Physical Exam:   VS:  There were no vitals taken for this visit.   Wt Readings from Last 3 Encounters:  01/12/23 229 lb (103.9 kg)  01/10/23 218 lb (98.9 kg)  12/27/22 221 lb 3.2 oz (100.3 kg)    GEN: Well nourished, well developed in no acute distress NECK: No JVD; No carotid bruits CARDIAC: S1/S2, RRR, Grade 1/6 murmur, no rubs, no gallops RESPIRATORY:  Clear to auscultation without rales, wheezing or rhonchi  ABDOMEN: Soft, non-tender, non-distended EXTREMITIES:  No edema; No deformity   ASSESSMENT AND PLAN: .    CAD, chest pain, DOE Admits to recent onset of chest pain and shortness of breath with exertion, requesting to restart Ranexa. LHC report noted above, medical therapy recommended. Patient requests to return to taking Ranexa and say he is willing to restart Ranexa as this helped his symptoms in the past, reports  he did have some palpitations on this medication, I told him to let us know if he notices any symptoms. He verbalized understanding and is agreeable with treatment plan. EKG reassuring today. Will begin Ranexa 500 mg BID. No other medication changes at this time. Echo 03/2022 benign. Heart healthy diet and regular cardiovascular exercise encouraged. Care and ED precautions discussed.   HTN BP stable and at goal. Discussed to monitor BP at home at least 2 hours after medications and sitting for 5-10 minutes. Continue current medication regimen. Heart healthy diet and regular cardiovascular exercise encouraged.   HLD LDL 82 08/2022. Continue rosuvastatin. Has upcoming pending labs. Heart healthy diet and regular cardiovascular exercise encouraged.   CHB, s/p PPM Most recent remote device check  showed normal device function. Continue to follow-up with EP.   Suspected sleep apnea Stop-Bang Score 5. Reports some symptoms of sleep apnea. Plan to address and arrange home sleep study at future office visit per patient's request.   Pre-operative cardiovascular clearance Not addressed at today's visit d/t patient's chief complaints of chest pain and DOE - see above. Will bring patient back for close follow-up and address at next office visit if CP and shortness of breath are resolved and improved.    {Click Here to Calculate RCRI      :[id number]}  { Click Here to Calculate DASI      :[id number]} [name]'s perioperative risk of a major cardiac event is 6.6 % according to the Revised Cardiac Risk Index (RCRI).  Therefore, he is at high risk for perioperative complications.   His functional capacity is excellent at   METs according to the Duke Activity Status Index (DASI). Recommendations: According to ACC/AHA guidelines, no further cardiovascular testing needed.  The patient may proceed to surgery at acceptable risk.   Antiplatelet and/or Anticoagulation Recommendations: {Antiplatelet Recommendations                  :21036016} {Anticoagulation Recommendations           :[id number]}    Doing great.   Dispo: Follow-up with me/APP in 4-6 weeks or sooner if anything changes.   Signed, Sharlene Dory, NP

## 2023-02-13 ENCOUNTER — Encounter: Payer: Self-pay | Admitting: Nurse Practitioner

## 2023-02-14 NOTE — Progress Notes (Signed)
Remote pacemaker transmission.   

## 2023-02-21 ENCOUNTER — Ambulatory Visit
Payer: Medicare Other | Attending: Student in an Organized Health Care Education/Training Program | Admitting: Student in an Organized Health Care Education/Training Program

## 2023-02-21 VITALS — BP 155/77 | HR 63 | Temp 98.0°F | Resp 16 | Ht 71.0 in | Wt 220.0 lb

## 2023-02-21 DIAGNOSIS — M5412 Radiculopathy, cervical region: Secondary | ICD-10-CM

## 2023-02-21 DIAGNOSIS — M5416 Radiculopathy, lumbar region: Secondary | ICD-10-CM | POA: Diagnosis not present

## 2023-02-21 DIAGNOSIS — G8929 Other chronic pain: Secondary | ICD-10-CM

## 2023-02-21 DIAGNOSIS — G894 Chronic pain syndrome: Secondary | ICD-10-CM | POA: Diagnosis not present

## 2023-02-21 DIAGNOSIS — M47812 Spondylosis without myelopathy or radiculopathy, cervical region: Secondary | ICD-10-CM | POA: Diagnosis not present

## 2023-02-21 DIAGNOSIS — Z981 Arthrodesis status: Secondary | ICD-10-CM

## 2023-02-21 DIAGNOSIS — M4722 Other spondylosis with radiculopathy, cervical region: Secondary | ICD-10-CM | POA: Diagnosis not present

## 2023-02-21 NOTE — Progress Notes (Signed)
PROVIDER NOTE: Information contained herein reflects review and annotations entered in association with encounter. Interpretation of such information and data should be left to medically-trained personnel. Information provided to patient can be located elsewhere in the medical record under "Patient Instructions". Document created using STT-dictation technology, any transcriptional errors that may result from process are unintentional.    Patient: [name]  Service Category: E/M  Provider: Edward Jolly, MD  DOB: 02/26/1956  DOS: 02/21/2023  Referring Provider: Babs Sciara, MD  MRN: [id number]  Specialty: Interventional Pain Management  PCP: Babs Sciara, MD  Type: Established Patient  Setting: Ambulatory outpatient    Location: Office  Delivery: Face-to-face     HPI  [name], a 67 y.o. year old male, is here today because of his Chronic radicular lumbar pain [M54.16, G89.29]. [name]'s primary complain today is Back Pain (lower)  Pertinent problems: [name] has Lumbar radiculopathy; Cervical spondylosis; Cervical radicular pain; S/P cervical spinal fusion; and Chronic pain syndrome on their pertinent problem list. Pain Assessment: Severity of Chronic pain is reported as a 4 /10. Location: Back Right, Left/hips bilateral down back of legs bilateral to entire foot, left side foot. Onset: More than a month ago. Quality: Aching, Burning, Sore, Sharp. Timing:  . Modifying factor(s): rest, fetal position, back brace. Vitals:  height is 5\' 11"  (1.803 m) and weight is 220 lb (99.8 kg). His temperature is 98 F (36.7 C). His blood pressure is 155/77 (abnormal) and his pulse is 63. His respiration is 16 and oxygen saturation is 97%.  BMI: Estimated body mass index is 30.68 kg/m as calculated from the following:   Height as of this encounter: 5\' 11"  (1.803 m).   Weight as of this encounter: 220 lb (99.8 kg). Last encounter: 12/26/2022. Last procedure: 01/10/2023.  Reason for  encounter: post-procedure evaluation and assessment. And to discuss cervical spine pain   Post-procedure evaluation   Procedure: Lumbar trans-foraminal epidural steroid injection (L-TFESI) #1  Laterality: Left (-LT)  Level: L4 and L5 nerve root(s) Imaging: Fluoroscopy-guided         Anesthesia: Local anesthesia (1-2% Lidocaine) Anxiolysis: 5 mg Valium DOS: 01/10/2023  Performed by: Edward Jolly, MD  Purpose: Diagnostic/Therapeutic Indications: Lumbar radicular pain severe enough to impact quality of life or function. 1. Chronic radicular lumbar pain   2. Lumbar radiculopathy   3. Chronic pain syndrome    NAS-11 Pain score:   Pre-procedure: 5 /10   Post-procedure: 4 /10      Effectiveness:  Initial hour after procedure: 80 %  Subsequent 4-6 hours post-procedure: 90 %  Analgesia past initial 6 hours: 50 % (5 days)  Ongoing improvement:  Analgesic: Back to preprocedure level Function: Back to baseline ROM: Back to baseline    ROS  Constitutional: Denies any fever or chills Gastrointestinal: No reported hemesis, hematochezia, vomiting, or acute GI distress Musculoskeletal:  Low back and left leg pain, cervical spine pain with radiation into bilateral shoulders Neurological: No reported episodes of acute onset apraxia, aphasia, dysarthria, agnosia, amnesia, paralysis, loss of coordination, or loss of consciousness  Medication Review  ALPRAZolam, HYDROmorphone, acetaminophen, albuterol, amLODipine, aspirin EC, esomeprazole, isosorbide mononitrate, losartan, metFORMIN, nitroGLYCERIN, ondansetron, propranolol ER, ranolazine, rosuvastatin, and tamsulosin  History Review  Allergy: [name] is allergic to morphine and codeine, lasix [furosemide], and latex. Drug: [name]  reports that he does not currently use drugs after having used the following drugs: Marijuana. Alcohol:  reports current alcohol use of about 7.0 -  14.0 standard drinks of alcohol per week. Tobacco:   reports that he quit smoking about 19 years ago. His smoking use included cigarettes. He started smoking about 55 years ago. He has a 99.9 pack-year smoking history. He has quit using smokeless tobacco. Social: [name]  reports that he quit smoking about 19 years ago. His smoking use included cigarettes. He started smoking about 55 years ago. He has a 99.9 pack-year smoking history. He has quit using smokeless tobacco. He reports current alcohol use of about 7.0 - 14.0 standard drinks of alcohol per week. He reports that he does not currently use drugs after having used the following drugs: Marijuana. Medical:  has a past medical history of ADD (attention deficit disorder), Anginal pain (HCC), Anxiety, Aortic atherosclerosis (HCC) (05/27/2020), AV block, 3rd degree (HCC), Colon polyps (2011), Complete heart block (HCC), Coronary artery disease, Degenerative disc disease, cervical, Depression, DM (diabetes mellitus) (HCC), Gastrointestinal bleeding (10/15/2017), GERD (gastroesophageal reflux disease), Hyperlipidemia, Hypertension, Pacemaker, Palpitations (2003), Pneumonia, Prostatitis, Seasonal allergies, and Sleep apnea. Surgical: [name]  has a past surgical history that includes Anterior cervical decomp/discectomy fusion; Knee surgery (Right); Colonoscopy w/ polypectomy (2011); Esophagogastroduodenoscopy; left heart catheterization with coronary angiogram (N/A, 10/17/2012); percutaneous coronary stent intervention (pci-s) (10/17/2012); left heart catheterization with coronary angiogram (N/A, 12/17/2012); permanent pacemaker insertion (N/A, 12/17/2012); left heart catheterization with coronary angiogram (N/A, 06/11/2013); Biventricular pacemaker upgrade/ system revision (04/02/2014); Colonoscopy (2017); Polypectomy (2017); Upper gastrointestinal endoscopy; Back surgery; Insert / replace / remove pacemaker (12/17/2012); LEFT HEART CATH AND CORONARY ANGIOGRAPHY (N/A, 08/22/2018); CORONARY STENT INTERVENTION  (N/A, 08/22/2018); LEFT HEART CATH AND CORONARY ANGIOGRAPHY (N/A, 06/15/2020); CORONARY BALLOON ANGIOPLASTY (N/A, 06/15/2020); Coronary Ultrasound/IVUS (N/A, 06/15/2020); and LEFT HEART CATH AND CORONARY ANGIOGRAPHY (N/A, 09/01/2021). Family: family history includes Breast cancer in his maternal aunt; Heart disease in his mother; Hypertension in his mother; Prostate cancer (age of onset: 82) in his brother.  Laboratory Chemistry Profile   Renal Lab Results  Component Value Date   BUN 21 01/18/2023   CREATININE 1.09 01/18/2023   BCR 19 01/18/2023   GFR 74.38 10/08/2013   GFRAA 91 06/11/2020   GFRNONAA >60 02/22/2022    Hepatic Lab Results  Component Value Date   AST 18 01/18/2023   ALT 22 01/18/2023   ALBUMIN 4.9 01/18/2023   ALKPHOS 61 01/18/2023    Electrolytes Lab Results  Component Value Date   NA 141 01/18/2023   K 4.5 01/18/2023   CL 101 01/18/2023   CALCIUM 10.1 01/18/2023   MG 2.3 10/16/2012    Bone No results found for: "VD25OH", "VD125OH2TOT", "ZO1096EA5", "WU9811BJ4", "25OHVITD1", "25OHVITD2", "25OHVITD3", "TESTOFREE", "TESTOSTERONE"  Inflammation (CRP: Acute Phase) (ESR: Chronic Phase) Lab Results  Component Value Date   CRP 3.7 12/14/2014   ESRSEDRATE 2 12/14/2020   LATICACIDVEN 0.9 10/15/2017         Note: Above Lab results reviewed.  CLINICAL DATA:  Neck pain, chronic Cervical radiculopathy, no red flags Cervicalgia. Worsening severe neck pain radiating to the bilateral shoulders. History of cervical spinal fusion.   EXAM: CT CERVICAL SPINE WITHOUT CONTRAST   TECHNIQUE: Multidetector CT imaging of the cervical spine was performed without intravenous contrast. Multiplanar CT image reconstructions were also generated.   RADIATION DOSE REDUCTION: This exam was performed according to the departmental dose-optimization program which includes automated exposure control, adjustment of the mA and/or kV according to patient size and/or use of iterative  reconstruction technique.   COMPARISON:  CT cervical spine myelogram 11/15/2017.   FINDINGS: Alignment: Normal.  Skull base and vertebrae: Unchanged postoperative appearance from prior C5-C7 ACDF. Hardware is intact. No evidence of loosening. Solid bony bridging across the intervening disc spaces.   Soft tissues and spinal canal: No prevertebral fluid or swelling. No visible canal hematoma.   Disc levels:   C2-C3: Left-greater-than-right facet arthropathy and uncovertebral joint spurring results in moderate left neural foraminal narrowing.   C3-C4: Disc bulge results in mild spinal canal stenosis. Left-greater-than-right facet arthropathy and uncovertebral joint spurring results in moderate left neural foraminal narrowing.   C4-C5: Adjacent segment. Severe disc height loss with superimposed disc bulge and ligamentum flavum buckling results in mild spinal canal stenosis. Facet arthropathy and uncovertebral joint spurring contribute to moderate bilateral neural foraminal narrowing.   C5-C6: Prior ACDF. No spinal canal stenosis or neural foraminal narrowing.   C6-C7: Prior ACDF. No spinal canal stenosis or neural foraminal narrowing.   C7-T1: Right-greater-than-left facet arthropathy and uncovertebral joint spurring results in moderate right neural foraminal narrowing.   Upper chest: Unremarkable.   Other: Atherosclerotic calcifications of the carotid bulbs.   IMPRESSION: 1. Unchanged postoperative appearance from prior C5-C7 ACDF. No evidence of hardware complication. 2. Adjacent segment degeneration at C4-C5 with mild spinal canal stenosis and moderate bilateral neural foraminal narrowing. 3. Moderate left neural foraminal narrowing at C2-C3 and C3-C4. 4. Moderate right neural foraminal narrowing at C7-T1.     Electronically Signed   By: Orvan Falconer M.D.   On: 01/25/2023 16:34   Physical Exam  General appearance: Well nourished, well developed, and well  hydrated. In no apparent acute distress Mental status: Alert, oriented x 3 (person, place, & time)       Respiratory: No evidence of acute respiratory distress Eyes: PERLA Vitals: BP (!) 155/77   Pulse 63   Temp 98 F (36.7 C)   Resp 16   Ht 5\' 11"  (1.803 m)   Wt 220 lb (99.8 kg)   SpO2 97%   BMI 30.68 kg/m  BMI: Estimated body mass index is 30.68 kg/m as calculated from the following:   Height as of this encounter: 5\' 11"  (1.803 m).   Weight as of this encounter: 220 lb (99.8 kg). Ideal: Ideal body weight: 75.3 kg (166 lb 0.1 oz) Adjusted ideal body weight: 85.1 kg (187 lb 9.7 oz)  Cervical Spine Area Exam  Skin & Axial Inspection: No masses, redness, edema, swelling, or associated skin lesions Alignment: Symmetrical Functional ROM: Pain restricted ROM, bilaterally Stability: No instability detected Muscle Tone/Strength: Functionally intact. No obvious neuro-muscular anomalies detected. Sensory (Neurological): Dermatomal pain pattern Palpation: No palpable anomalies             Upper Extremity (UE) Exam    Side: Right upper extremity  Side: Left upper extremity  Skin & Extremity Inspection: Skin color, temperature, and hair growth are WNL. No peripheral edema or cyanosis. No masses, redness, swelling, asymmetry, or associated skin lesions. No contractures.  Skin & Extremity Inspection: Skin color, temperature, and hair growth are WNL. No peripheral edema or cyanosis. No masses, redness, swelling, asymmetry, or associated skin lesions. No contractures.  Functional ROM: Unrestricted ROM          Functional ROM: Unrestricted ROM          Muscle Tone/Strength: Functionally intact. No obvious neuro-muscular anomalies detected.  Muscle Tone/Strength: Functionally intact. No obvious neuro-muscular anomalies detected.  Sensory (Neurological): Unimpaired          Sensory (Neurological): Unimpaired          Palpation:  No palpable anomalies              Palpation: No palpable anomalies               Provocative Test(s):  Phalen's test: deferred Tinel's test: deferred Apley's scratch test (touch opposite shoulder):  Action 1 (Across chest): deferred Action 2 (Overhead): deferred Action 3 (LB reach): deferred   Provocative Test(s):  Phalen's test: deferred Tinel's test: deferred Apley's scratch test (touch opposite shoulder):  Action 1 (Across chest): deferred Action 2 (Overhead): deferred Action 3 (LB reach): deferred    Lumbar Spine Area Exam  Skin & Axial Inspection: No masses, redness, or swelling Alignment: Symmetrical Functional ROM: Pain restricted ROM affecting both sides L>>R Stability: No instability detected Muscle Tone/Strength: Functionally intact. No obvious neuro-muscular anomalies detected. Sensory (Neurological): Dermatomal pain pattern  Ambulation: Unassisted Gait: Relatively normal for age and body habitus Posture: WNL  Lower Extremity Exam    Side: Right lower extremity  Side: Left lower extremity  Stability: No instability observed          Stability: No instability observed          Skin & Extremity Inspection: Skin color, temperature, and hair growth are WNL. No peripheral edema or cyanosis. No masses, redness, swelling, asymmetry, or associated skin lesions. No contractures.  Skin & Extremity Inspection: Skin color, temperature, and hair growth are WNL. No peripheral edema or cyanosis. No masses, redness, swelling, asymmetry, or associated skin lesions. No contractures.  Functional ROM: Unrestricted ROM                  Functional ROM: Unrestricted ROM                  Muscle Tone/Strength: Functionally intact. No obvious neuro-muscular anomalies detected.  Muscle Tone/Strength: Functionally intact. No obvious neuro-muscular anomalies detected.  Sensory (Neurological): Unimpaired        Sensory (Neurological): Unimpaired        DTR: Patellar: deferred today Achilles: deferred today Plantar: deferred today  DTR: Patellar: deferred  today Achilles: deferred today Plantar: deferred today  Palpation: No palpable anomalies  Palpation: No palpable anomalies    Assessment   Diagnosis  1. Chronic radicular lumbar pain   2. Lumbar radiculopathy   3. Cervical spondylosis   4. Cervical radicular pain   5. S/P cervical spinal fusion   6. Chronic pain syndrome        Plan of Care  Problem-specific:   For lumbar radicular pain, left leg radiation due to left L4 neurofibroma, limited response to lumbar transforaminal ESI.  Reviewed injection images with patient.  Future considerations could include referral to neurosurgery to consider surgical decompression via foraminotomy and potential fusion We also discussed spinal cord stimulation for his chronic lumbar radicular pain. We reviewed his cervical spine CT which shows adjacent segment disease and significant cervical disc degeneration at C4-C5 with moderate bilateral neuroforaminal narrowing.  Patient also has moderate right neuroforaminal narrowing at C7-T1.  We discussed a cervical epidural steroid injection for this.  Risks and benefits reviewed and patient would like to proceed. I also recommend physical therapy for his lumbar radicular pain   Orders:  Orders Placed This Encounter  Procedures   Cervical Epidural Injection    Sedation: Patient's choice. Purpose: Diagnostic/Therapeutic Indication(s): Radiculitis and cervicalgia associater with cervical degenerative disc disease.    Standing Status:   Future    Standing Expiration Date:   05/24/2023    Scheduling Instructions:  Procedure: Cervical Epidural Steroid Injection/Block     Level(s): C7-T1     Laterality: TBD     Timeframe: As soon as schedule allows    Order Specific Question:   Where will this procedure be performed?    Answer:   [facility]    Comments:   AmeLie Hollars   Ambulatory referral to Physical Therapy    Referral Priority:   Routine    Referral Type:   Physical Medicine    Referral  Reason:   Specialty Services Required    Requested Specialty:   Physical Therapy    Number of Visits Requested:   1   Follow-up plan:   Return in about 2 weeks (around 03/07/2023) for C-ESI, in clinic [facility].      Left L4 and L5 TF ESI 01/10/23     Recent Visits Date Type Provider Dept  01/10/23 Procedure visit Edward Jolly, MD [facility]  12/26/22 Office Visit Edward Jolly, MD [facility]  Showing recent visits within past 90 days and meeting all other requirements Today's Visits Date Type Provider Dept  02/21/23 Office Visit Edward Jolly, MD [facility]  Showing today's visits and meeting all other requirements Future Appointments No visits were found meeting these conditions. Showing future appointments within next 90 days and meeting all other requirements  I discussed the assessment and treatment plan with the patient. The patient was provided an opportunity to ask questions and all were answered. The patient agreed with the plan and demonstrated an understanding of the instructions.  Patient advised to call back or seek an in-person evaluation if the symptoms or condition worsens.  Duration of encounter: 30 minutes.  Total time on encounter, as per AMA guidelines included both the face-to-face and non-face-to-face time personally spent by the physician and/or other qualified health care professional(s) on the day of the encounter (includes time in activities that require the physician or other qualified health care professional and does not include time in activities normally performed by clinical staff). Physician's time may include the following activities when performed: Preparing to see the patient (e.g., pre-charting review of records, searching for previously ordered imaging, lab work, and nerve conduction tests) Review of prior analgesic pharmacotherapies. Reviewing PMP Interpreting ordered tests (e.g., lab work, imaging, nerve conduction  tests) Performing post-procedure evaluations, including interpretation of diagnostic procedures Obtaining and/or reviewing separately obtained history Performing a medically appropriate examination and/or evaluation Counseling and educating the patient/family/caregiver Ordering medications, tests, or procedures Referring and communicating with other health care professionals (when not separately reported) Documenting clinical information in the electronic or other health record Independently interpreting results (not separately reported) and communicating results to the patient/ family/caregiver Care coordination (not separately reported)  Note by: Edward Jolly, MD Date: 02/21/2023; Time: 2:50 PM

## 2023-02-21 NOTE — Patient Instructions (Addendum)
______________________________________________________________________    Preparing for your procedure  Appointments: If you think you may not be able to keep your appointment, call 24-48 hours in advance to cancel. We need time to make it available to others.  During your procedure appointment there will be: No Prescription Refills. No disability issues to discussed. No medication changes or discussions.  Instructions: Food intake: Avoid eating anything solid for at least 8 hours prior to your procedure. Clear liquid intake: You may take clear liquids such as water up to 2 hours prior to your procedure. (No carbonated drinks. No soda.) Transportation: Unless otherwise stated by your physician, bring a driver. (Driver cannot be a Market researcher, Pharmacist, community, or any other form of public transportation.) Morning Medicines: Except for blood thinners, take all of your other morning medications with a sip of water. Make sure to take your heart and blood pressure medicines. If your blood pressure's lower number is above 100, the case will be rescheduled. Blood thinners: Make sure to stop your blood thinners as instructed.  If you take a blood thinner, but were not instructed to stop it, call our office [phone] and ask to talk to a nurse. Not stopping a blood thinner prior to certain procedures could lead to serious complications. Diabetics on insulin: Notify the staff so that you can be scheduled 1st case in the morning. If your diabetes requires high dose insulin, take only  of your normal insulin dose the morning of the procedure and notify the staff that you have done so. Preventing infections: Shower with an antibacterial soap the morning of your procedure.  Build-up your immune system: Take 1000 mg of Vitamin C with every meal (3 times a day) the day prior to your procedure. Antibiotics: Inform the nursing staff if you are taking any antibiotics or if you have any conditions that may require antibiotics  prior to procedures. (Example: recent joint implants)   Pregnancy: If you are pregnant make sure to notify the nursing staff. Not doing so may result in injury to the fetus, including death.  Sickness: If you have a cold, fever, or any active infections, call and cancel or reschedule your procedure. Receiving steroids while having an infection may result in complications. Arrival: You must be in the facility at least 30 minutes prior to your scheduled procedure. Tardiness: Your scheduled time is also the cutoff time. If you do not arrive at least 15 minutes prior to your procedure, you will be rescheduled.  Children: Do not bring any children with you. Make arrangements to keep them home. Dress appropriately: There is always a possibility that your clothing may get soiled. Avoid long dresses. Valuables: Do not bring any jewelry or valuables.  Reasons to call and reschedule or cancel your procedure: (Following these recommendations will minimize the risk of a serious complication.) Surgeries: Avoid having procedures within 2 weeks of any surgery. (Avoid for 2 weeks before or after any surgery). Flu Shots: Avoid having procedures within 2 weeks of a flu shots or . (Avoid for 2 weeks before or after immunizations). Barium: Avoid having a procedure within 7-10 days after having had a radiological study involving the use of radiological contrast. (Myelograms, Barium swallow or enema study). Heart attacks: Avoid any elective procedures or surgeries for the initial 6 months after a "Myocardial Infarction" (Heart Attack). Blood thinners: It is imperative that you stop these medications before procedures. Let us know if you if you take any blood thinner.  Infection: Avoid procedures during or within  two weeks of an infection (including chest colds or gastrointestinal problems). Symptoms associated with infections include: Localized redness, fever, chills, night sweats or profuse sweating, burning sensation  when voiding, cough, congestion, stuffiness, runny nose, sore throat, diarrhea, nausea, vomiting, cold or Flu symptoms, recent or current infections. It is specially important if the infection is over the area that we intend to treat. Heart and lung problems: Symptoms that may suggest an active cardiopulmonary problem include: cough, chest pain, breathing difficulties or shortness of breath, dizziness, ankle swelling, uncontrolled high or unusually low blood pressure, and/or palpitations. If you are experiencing any of these symptoms, cancel your procedure and contact your primary care physician for an evaluation.  Remember:  Regular Business hours are:  Monday to Thursday 8:00 AM to 4:00 PM  Provider's Schedule: Delano Metz, MD:  Procedure days: Tuesday and Thursday 7:30 AM to 4:00 PM  Edward Jolly, MD:  Procedure days: Monday and Wednesday 7:30 AM to 4:00 PM Last  Updated: 12/05/2022 ______________________________________________________________________    Epidural Steroid Injection  An epidural steroid injection is a shot of steroid medicine, also called cortisone, and a numbing medicine that is given into the epidural space. This space is between the spinal cord and the bones of the back. This shot helps relieve pain caused by an irritated or swollen nerve root. The pain relief you get from the injection depends on the cause of your condition and how long your pain lasts. You may have a period of slightly more pain after your injection, before the steroid medicine takes effect. This medicine usually starts working within 1-3 days. In some cases, you might need 7-10 days to feel the full effect. Tell your health care provider about: Any allergies you have. All medicines you are taking, including vitamins, herbs, eye drops, creams, and over-the-counter medicines. Any problems you or family members have had with anesthesia. Any bleeding problems you have. Any surgeries you have  had. Any medical conditions you have. Whether you are pregnant or may be pregnant. What are the risks? Your health care provider will talk with you about risks. These may include: Headache. Bleeding. Infection. Allergic reaction to medicines or dyes. Nerve damage. Not being able to move (paralysis). This is rare. What happens before the procedure? Medicines You may be given medicines to lower anxiety. Ask your provider about: Changing or stopping your regular medicines. These include any diabetes medicines or blood thinners you take. Taking medicines such as aspirin and ibuprofen. These medicines can thin your blood. Do not take them unless your provider tells you to. Taking over-the-counter medicines, vitamins, herbs, and supplements. General instructions Follow instructions from your provider about what you may eat and drink. Ask your provider what steps will be taken to help prevent infection. If you will be going home right after the procedure, plan to have a responsible adult: Take you home from the hospital or clinic. You will not be allowed to drive. Care for you for the time you are told. What happens during the procedure?  An IV will be inserted into one of your veins. You may be given a sedative to help you relax. You will be asked to sit or lie on your side. The injection site will be cleaned. An X-ray machine will be used to guide the needle close to the nerve that is causing pain. A needle will be put through your skin into the epidural space. This may cause you some discomfort. Contrast dye may be injected at the site  to make sure that the steroid medicine will be sent to the exact place it needs to go. The steroid medicine and a numbing medicine will be injected into the epidural space for pain relief. The needle will be removed. A bandage (dressing) will be put over the injection site. The procedure may vary among providers and hospitals. What happens after the  procedure? Your blood pressure, heart rate, breathing rate, and blood oxygen level will be monitored until you leave the hospital or clinic. Your IV will be removed. Your arm or leg may feel weak or numb for a few hours. This information is not intended to replace advice given to you by your health care provider. Make sure you discuss any questions you have with your health care provider. Document Revised: 11/11/2021 Document Reviewed: 11/11/2021 Elsevier Patient Education  2024 [employer] Please print physical therapy referral so patient can take it to [facility] [place] or have [place] fax it to them Follow up in 2 weeks for [facility]

## 2023-02-21 NOTE — Progress Notes (Signed)
Safety precautions to be maintained throughout the outpatient stay will include: orient to surroundings, keep bed in low position, maintain call bell within reach at all times, provide assistance with transfer out of bed and ambulation.  

## 2023-02-22 ENCOUNTER — Ambulatory Visit: Payer: Medicare Other | Admitting: Family Medicine

## 2023-02-22 ENCOUNTER — Encounter: Payer: Self-pay | Admitting: Family Medicine

## 2023-02-22 VITALS — BP 132/70 | HR 60 | Temp 98.4°F | Ht 71.0 in | Wt 226.2 lb

## 2023-02-22 DIAGNOSIS — I10 Essential (primary) hypertension: Secondary | ICD-10-CM | POA: Diagnosis not present

## 2023-02-22 DIAGNOSIS — E1169 Type 2 diabetes mellitus with other specified complication: Secondary | ICD-10-CM

## 2023-02-22 DIAGNOSIS — E785 Hyperlipidemia, unspecified: Secondary | ICD-10-CM | POA: Diagnosis not present

## 2023-02-22 DIAGNOSIS — E118 Type 2 diabetes mellitus with unspecified complications: Secondary | ICD-10-CM

## 2023-02-22 DIAGNOSIS — I25119 Atherosclerotic heart disease of native coronary artery with unspecified angina pectoris: Secondary | ICD-10-CM

## 2023-02-22 MED ORDER — CYCLOBENZAPRINE HCL 5 MG PO TABS: 5.0000 mg | ORAL_TABLET | Freq: Three times a day (TID) | ORAL | 1 refills | Status: DC | PRN

## 2023-02-22 MED ORDER — METFORMIN HCL 500 MG PO TABS: 500.0000 mg | ORAL_TABLET | Freq: Two times a day (BID) | ORAL | 1 refills | Status: DC

## 2023-02-22 NOTE — Progress Notes (Signed)
Subjective:    Patient ID: [name], male    DOB: [birth year], 67 y.o.   MRN: [id number]  Discussed the use of AI scribe software for clinical note transcription with the patient, who gave verbal consent to proceed.  History of Present Illness   The patient, diagnosed with diabetes, coronary artery disease, and obesity, presents with concerns about his current health status and medication regimen. He reports a lack of physical activity due to a recent back sprain, which occurred approximately two weeks ago. The patient admits to poor dietary choices, including occasional consumption of ice cream and sweets, but denies overeating. He expresses frustration over weight gain despite perceived reduced food intake.  His typical daily meals consist of a breakfast of two sausage patties, three eggs, and a keto burrito, an apple for lunch, and a dinner of meatloaf or chili. The patient is interested in starting Ozempic, hoping it will aid in weight loss and better manage his diabetes. He reports previous medication-induced nausea and is concerned about potential side effects.  The patient also reports urinary issues, including frequent urination and incomplete bladder emptying. He is currently taking two doses of Tamsulosin daily for this issue. He expresses dissatisfaction with his current weight and is seeking a change in his medication regimen to better manage his diabetes and aid in weight loss.  In addition to these concerns, the patient is scheduled for a double hernia surgery and is considering physical therapy for his back pain. He has a pacemaker and is due for a neck injection in the coming weeks. The patient is seeking a prescription for a muscle relaxer to manage his back pain, which he describes as severe enough to take his breath away. He reports that painkillers make him nauseous and prefers to use muscle relaxers when the pain is particularly intense.         Review of Systems      Objective:    Physical Exam   VITALS: BP- 132/70 MEASUREMENTS: BMI- 31.5 EXTREMITIES: No edema in ankles.     Lungs are clear respiratory rate normal heart regular pulse normal      Assessment & Plan:  Assessment and Plan    Type 2 Diabetes Mellitus Poorly controlled with current regimen. Patient experiencing nausea with current medication. Discussed the benefits of GLP-1 agonists such as Ozempic and Mounjaro for improved glycemic control, weight loss, and cardiovascular benefits. Patient prefers to try [facility]. -Write a letter to [place] provider recommending a switch to [facility] for better glycemic control, weight loss, and cardiovascular benefits. -Advise patient to divide meals throughout the day to minimize nausea.  Back Pain Recent sprain causing significant discomfort and limiting physical activity. Patient is scheduled for physical therapy. -Continue current pain management regimen. -Encourage patient to engage in core exercises and stretching as recommended by physical therapist.  Urinary Symptoms Patient reports improved urinary flow with current medication, but still feels incomplete bladder emptying. -Continue current medication regimen. -Consider urology referral for further evaluation if symptoms persist or worsen.  General Health Maintenance -Continue current medications for coronary artery disease and hypertension. -Consider potential for dose reduction in amlodipine and metformin as weight and glycemic control improve with new medication regimen. -Plan for follow-up appointment in the future, no immediate need for next week's appointment.      Patient's A1c combined with heart disease and his weight puts him at increased risk of cardiovascular complications He is already on metformin Based upon these factors I would recommend  Mounjaro He would continue the metformin Continue healthy eating and regular activity

## 2023-02-25 ENCOUNTER — Encounter: Payer: Self-pay | Admitting: Family Medicine

## 2023-02-25 NOTE — Progress Notes (Signed)
Please include a copy of his most recent A1c Give this letter and the A1c result to the patient [facility] Please have me sign the letter as well thank you

## 2023-02-27 ENCOUNTER — Other Ambulatory Visit: Payer: Self-pay | Admitting: Cardiology

## 2023-03-02 ENCOUNTER — Ambulatory Visit: Payer: Medicare Other | Admitting: Family Medicine

## 2023-03-12 ENCOUNTER — Ambulatory Visit
Payer: Medicare Other | Attending: Student in an Organized Health Care Education/Training Program | Admitting: Student in an Organized Health Care Education/Training Program

## 2023-03-20 DIAGNOSIS — L821 Other seborrheic keratosis: Secondary | ICD-10-CM | POA: Diagnosis not present

## 2023-03-20 DIAGNOSIS — D225 Melanocytic nevi of trunk: Secondary | ICD-10-CM | POA: Diagnosis not present

## 2023-03-20 DIAGNOSIS — L400 Psoriasis vulgaris: Secondary | ICD-10-CM | POA: Diagnosis not present

## 2023-03-20 DIAGNOSIS — D2262 Melanocytic nevi of left upper limb, including shoulder: Secondary | ICD-10-CM | POA: Diagnosis not present

## 2023-03-22 DIAGNOSIS — F4312 Post-traumatic stress disorder, chronic: Secondary | ICD-10-CM | POA: Diagnosis not present

## 2023-03-30 ENCOUNTER — Encounter: Payer: Self-pay | Admitting: Family Medicine

## 2023-03-30 NOTE — Telephone Encounter (Signed)
Nurses This type of illness could take a few days to run its course Certainly if it gets worse would need to be seen But also I am willing to work him in around 4 PM if he would like to be seen  otherwise if things get worse over the weekend I recommend urgent care

## 2023-04-06 ENCOUNTER — Ambulatory Visit (INDEPENDENT_AMBULATORY_CARE_PROVIDER_SITE_OTHER): Payer: Medicare Other

## 2023-04-06 VITALS — Ht 71.0 in | Wt 220.0 lb

## 2023-04-06 DIAGNOSIS — Z0000 Encounter for general adult medical examination without abnormal findings: Secondary | ICD-10-CM | POA: Diagnosis not present

## 2023-04-06 NOTE — Progress Notes (Signed)
Please attest and cosign this visit due to patients primary care provider not being in the office at the time the visit was completed.   Because this visit was a virtual/telehealth visit,  certain criteria was not obtained, such a blood pressure, CBG if applicable, and timed get up and go. Any medications not marked as "taking" were not mentioned during the medication reconciliation part of the visit. Any vitals not documented were not able to be obtained due to this being a telehealth visit or patient was unable to self-report a recent blood pressure reading due to a lack of equipment at home via telehealth. Vitals that have been documented are verbally provided by the patient.   Subjective:   [name] is a 67 y.o. male who presents for Medicare Annual/Subsequent preventive examination.  Visit Complete: Virtual I connected with  [name] on 04/06/23 by a audio enabled telemedicine application and verified that I am speaking with the correct person using two identifiers.  Patient Location: Home  Provider Location: Home Office  I discussed the limitations of evaluation and management by telemedicine. The patient expressed understanding and agreed to proceed.  Vital Signs: Because this visit was a virtual/telehealth visit, some criteria may be missing or patient reported. Any vitals not documented were not able to be obtained and vitals that have been documented are patient reported.  Patient Medicare AWV questionnaire was completed by the patient on na; I have confirmed that all information answered by patient is correct and no changes since this date.  Cardiac Risk Factors include: advanced age (>24men, >13 women);dyslipidemia;hypertension;male gender;obesity (BMI >30kg/m2);sedentary lifestyle;diabetes mellitus     Objective:    Today's Vitals   04/06/23 1303  Weight: 220 lb (99.8 kg)  Height: 5\' 11"  (1.803 m)  PainSc: 4    Body mass index is 30.68 kg/m.      04/06/2023    1:07 PM 02/21/2023    1:56 PM 03/31/2022    1:36 PM 02/22/2022    8:55 AM 09/01/2021    8:54 AM 03/22/2021    1:50 PM 06/15/2020    9:01 AM  Advanced Directives  Does Patient Have a Medical Advance Directive? No Yes Yes No Yes Yes Yes  Type of Advance Directive   Living will;Healthcare Power of [employer] Power of [place];Living will Living will;Healthcare Power of Attorney  Does patient want to make changes to medical advance directive?   No - Patient declined    No - Guardian declined  Copy of Healthcare Power of Attorney in Chart?   No - copy requested   No - copy requested No - copy requested  Would patient like information on creating a medical advance directive? No - Patient declined          Current Medications (verified) Outpatient Encounter Medications as of 04/06/2023  Medication Sig   acetaminophen (TYLENOL) 500 MG tablet Take 1,500 mg by mouth daily as needed for moderate pain.   albuterol (VENTOLIN HFA) 108 (90 Base) MCG/ACT inhaler TAKE 2 PUFFS BY MOUTH EVERY 6 HOURS AS NEEDED FOR WHEEZE OR SHORTNESS OF BREATH   ALPRAZolam (XANAX) 0.5 MG tablet 1/2-1 twice daily as needed home use only use sparingly   amLODipine (NORVASC) 10 MG tablet Take 1 tablet by mouth daily.   aspirin EC 81 MG tablet Take 81 mg by mouth daily. Swallow whole.   esomeprazole (NEXIUM) 20 MG capsule Take 20 mg by mouth daily.   HYDROmorphone (DILAUDID) 4 MG  tablet 1/2 to 1 q 6 hours prn severe pain   isosorbide mononitrate (IMDUR) 120 MG 24 hr tablet TAKE 1 TABLET(120 MG) BY MOUTH DAILY   losartan (COZAAR) 100 MG tablet TAKE 1 TABLET BY MOUTH EVERY DAY   metFORMIN (GLUCOPHAGE) 500 MG tablet Take 1 tablet (500 mg total) by mouth 2 (two) times daily.   nitroGLYCERIN (NITROSTAT) 0.4 MG SL tablet Place 1 tablet (0.4 mg total) under the tongue every 5 (five) minutes x 3 doses as needed for chest pain (if no relief after 3rd dose, proceed to ED for an evaluation).   ondansetron (ZOFRAN) 8  MG tablet TAKE 1 TABLET (8MG ) BY MOUTH EVERY 8 HOURS AS NEEDED FOR NAUSEA   propranolol ER (INDERAL LA) 160 MG SR capsule Take 1 capsule (160 mg total) by mouth daily.   ranolazine (RANEXA) 500 MG 12 hr tablet Take 1 tablet (500 mg total) by mouth 2 (two) times daily.   rosuvastatin (CRESTOR) 10 MG tablet TAKE 1 TABLET BY MOUTH EVERY DAY   tamsulosin (FLOMAX) 0.4 MG CAPS capsule 2 qhs   cyclobenzaprine (FLEXERIL) 5 MG tablet Take 1 tablet (5 mg total) by mouth 3 (three) times daily as needed for muscle spasms.   Facility-Administered Encounter Medications as of 04/06/2023  Medication   sodium chloride flush (NS) 0.9 % injection 3 mL    Allergies (verified) Morphine and codeine, Lasix [furosemide], and Latex   History: Past Medical History:  Diagnosis Date   ADD (attention deficit disorder)    Rx-Adderall   Anginal pain (HCC)    Anxiety    on meds   Aortic atherosclerosis (HCC) 05/27/2020   Seen on x-ray being treated with statin   AV block, 3rd degree (HCC)    Colon polyps 2011   tubular adenoma by excisional biopsy during colonoscopy   Complete heart block (HCC)    a. s/p PPM Placement in 12/2012 with Medtronic CRT-P placement in 03/2014   Coronary artery disease    a. s/p PCI of the RCA in 2014 b. 08/2018: patent RCA stent with new mid-LAD 85% stenosis (treated with PCI/DES) and 85% distal OM2 stenosis (med management recommended)   Degenerative disc disease, cervical    Depression    PTSD   DM (diabetes mellitus) (HCC)    on meds   Gastrointestinal bleeding 10/15/2017   GERD (gastroesophageal reflux disease)    Hyperlipidemia    on meds   Hypertension    on meds   Pacemaker    Palpitations 2003   Holter in 2003: PACs and PVCs; negative stress nuclear test in 2005; right bundle branch block; echo in 2008-mild LVH, otherwise normal; 2008-negative stress nuclear test.   Pneumonia    Prostatitis    prostate calcifications by CT   Seasonal allergies    Sleep apnea     questionable diagnosis   Past Surgical History:  Procedure Laterality Date   ANTERIOR CERVICAL DECOMP/DISCECTOMY FUSION     BACK SURGERY     Biventricular pacemaker upgrade/ system revision  04/02/2014   removal of previous atrial and ventricular leads with placement of a new MDT Consulta CRT-P system at [facility] by Dr Lorenza Cambridge   COLONOSCOPY  2017   MS-MAC-suprep(good)-leiomyoma/TA   COLONOSCOPY W/ POLYPECTOMY  2011   tubular adenoma   CORONARY BALLOON ANGIOPLASTY N/A 06/15/2020   Procedure: CORONARY BALLOON ANGIOPLASTY;  Surgeon: Corky Crafts, MD;  Location: [facility] INVASIVE CV LAB;  Service: Cardiovascular;  Laterality: N/A;   CORONARY STENT  INTERVENTION N/A 08/22/2018   Procedure: CORONARY STENT INTERVENTION;  Surgeon: Lennette Bihari, MD;  Location: [facility] INVASIVE CV LAB;  Service: Cardiovascular;  Laterality: N/A;   CORONARY ULTRASOUND/IVUS N/A 06/15/2020   Procedure: Intravascular Ultrasound/IVUS;  Surgeon: Corky Crafts, MD;  Location: [facility] INVASIVE CV LAB;  Service: Cardiovascular;  Laterality: N/A;   ESOPHAGOGASTRODUODENOSCOPY     Gastritis   INSERT / REPLACE / REMOVE PACEMAKER  12/17/2012   MDT Adapta L implanted by Dr Johney Frame for mobitz II second degree AV block   KNEE SURGERY Right    LEFT HEART CATH AND CORONARY ANGIOGRAPHY N/A 08/22/2018   Procedure: LEFT HEART CATH AND CORONARY ANGIOGRAPHY;  Surgeon: Lennette Bihari, MD;  Location: [facility] INVASIVE CV LAB;  Service: Cardiovascular;  Laterality: N/A;   LEFT HEART CATH AND CORONARY ANGIOGRAPHY N/A 06/15/2020   Procedure: LEFT HEART CATH AND CORONARY ANGIOGRAPHY;  Surgeon: Corky Crafts, MD;  Location: [facility] INVASIVE CV LAB;  Service: Cardiovascular;  Laterality: N/A;   LEFT HEART CATH AND CORONARY ANGIOGRAPHY N/A 09/01/2021   Procedure: LEFT HEART CATH AND CORONARY ANGIOGRAPHY;  Surgeon: Lennette Bihari, MD;  Location: [facility] INVASIVE CV LAB;  Service: Cardiovascular;  Laterality: N/A;   LEFT HEART  CATHETERIZATION WITH CORONARY ANGIOGRAM N/A 10/17/2012   Procedure: LEFT HEART CATHETERIZATION WITH CORONARY ANGIOGRAM;  Surgeon: Wendall Stade, MD;  Location: [facility] CATH LAB;  Service: Cardiovascular;  Laterality: N/A;   LEFT HEART CATHETERIZATION WITH CORONARY ANGIOGRAM N/A 12/17/2012   Procedure: LEFT HEART CATHETERIZATION WITH CORONARY ANGIOGRAM;  Surgeon: Peter M Swaziland, MD;  Location[facility] CATH LAB;  Service: Cardiovascular;  Laterality: N/A;   LEFT HEART CATHETERIZATION WITH CORONARY ANGIOGRAM N/A 06/11/2013   Procedure: LEFT HEART CATHETERIZATION WITH CORONARY ANGIOGRAM;  Surgeon: Iran Ouch, MD;  Location: [facility] CATH LAB;  Service: Cardiovascular;  Laterality: N/A;   PERCUTANEOUS CORONARY STENT INTERVENTION (PCI-S)  10/17/2012   Procedure: PERCUTANEOUS CORONARY STENT INTERVENTION (PCI-S);  Surgeon: Wendall Stade, MD;  Location: [facility] CATH LAB;  Service: Cardiovascular;;   PERMANENT PACEMAKER INSERTION N/A 12/17/2012   Procedure: PERMANENT PACEMAKER INSERTION;  Surgeon: Hillis Range, MD;  Location: [facility] CATH LAB;  Service: Cardiovascular;  Laterality: N/A;   POLYPECTOMY  2017   TA   UPPER GASTROINTESTINAL ENDOSCOPY     Family History  Problem Relation Age of Onset   Heart disease Mother    Hypertension Mother        Carotid disease   Prostate cancer Brother 70   Breast cancer Maternal Aunt    Colon cancer Neg Hx    Colon polyps Neg Hx    Rectal cancer Neg Hx    Stomach cancer Neg Hx    Esophageal cancer Neg Hx    Social History   Socioeconomic History   Marital status: Married    Spouse name: Not on file   Number of children: 3   Years of education: Not on file   Highest education level: Some college, no degree  Occupational History   Not on file  Tobacco Use   Smoking status: Former    Current packs/day: 0.00    Average packs/day: 2.5 packs/day for 40.0 years (99.9 ttl pk-yrs)    Types: Cigarettes    Start date: 04/18/1967    Quit date: 12/20/2003    Years since quitting:  19.3   Smokeless tobacco: Former  Building services engineer status: Former  Substance and Sexual Activity   Alcohol use: Yes    Alcohol/week: 7.0 -  14.0 standard drinks of alcohol    Types: 7 - 14 Shots of liquor per week    Comment: rare   Drug use: Not Currently    Types: Marijuana    Comment: 30 + years ago - "crank, marjiuanna, ect."   Sexual activity: Yes    Birth control/protection: Surgical  Other Topics Concern   Not on file  Social History Narrative   Retired Cabin crew.   Social Drivers of Corporate investment banker Strain: Low Risk  (04/06/2023)   Overall Financial Resource Strain (CARDIA)    Difficulty of Paying Living Expenses: Not hard at all  Food Insecurity: No Food Insecurity (04/06/2023)   Hunger Vital Sign    Worried About Running Out of Food in the Last Year: Never true    Ran Out of Food in the Last Year: Never true  Transportation Needs: No Transportation Needs (04/06/2023)   PRAPARE - Administrator, Civil Service (Medical): No    Lack of Transportation (Non-Medical): No  Physical Activity: Insufficiently Active (04/06/2023)   Exercise Vital Sign    Days of Exercise per Week: 1 day    Minutes of Exercise per Session: 30 min  Stress: Patient Declined (04/06/2023)   [employer] of Occupational Health - Occupational Stress Questionnaire    Feeling of Stress : Patient declined  Recent Concern: Stress - Stress Concern Present (02/21/2023)   [place] [employer] of Occupational Health - Occupational Stress Questionnaire    Feeling of Stress : Very much  Social Connections: Moderately Integrated (04/06/2023)   Social Connection and Isolation Panel [NHANES]    Frequency of Communication with Friends and Family: More than three times a week    Frequency of Social Gatherings with Friends and Family: More than three times a week    Attends Religious Services: More than 4 times per year    Active Member of [employer] or Organizations: No    Attends Tax inspector Meetings: Never    Marital Status: Married  Recent Concern: Social Connections - Socially Isolated (02/21/2023)   Social Connection and Isolation Panel [NHANES]    Frequency of Communication with Friends and Family: Never    Frequency of Social Gatherings with Friends and Family: Never    Attends Religious Services: Never    Database administrator or Organizations: No    Attends Engineer, structural: Not on file    Marital Status: Married    Tobacco Counseling Counseling given: Yes   Clinical Intake:  Pre-visit preparation completed: Yes  Pain : 0-10 Pain Score: 4  Pain Type: Chronic pain Pain Location: Neck Pain Descriptors / Indicators: Aching, Constant Pain Onset: More than a month ago Pain Frequency: Constant     BMI - recorded: 30.68 Nutritional Status: BMI > 30  Obese Nutritional Risks: None Diabetes: Yes CBG done?: No Did pt. bring in CBG monitor from home?: No  How often do you need to have someone help you when you read instructions, pamphlets, or other written materials from your doctor or pharmacy?: 1 - Never  Interpreter Needed?: No  Information entered by :: Abby Reanne Nellums, CMA   Activities of Daily Living    04/06/2023    1:06 PM  In your present state of health, do you have any difficulty performing the following activities:  Hearing? 0  Vision? 0  Difficulty concentrating or making decisions? 0  Walking or climbing stairs? 0  Dressing or bathing? 0  Doing errands, shopping? 0  Preparing Food and eating ? N  Using the Toilet? N  In the past six months, have you accidently leaked urine? N  Do you have problems with loss of bowel control? N  Managing your Medications? N  Managing your Finances? N  Housekeeping or managing your Housekeeping? N    Patient Care Team: Babs Sciara, MD as PCP - General (Family Medicine) Branch, Dorothe Pea, MD as PCP - Cardiology (Cardiology) Mealor, Roberts Gaudy, MD as PCP -  Electrophysiology (Cardiology) Rothbart, Gerrit Friends, MD (Cardiology) Wyline Mood Dorothe Pea, MD as Consulting Physician (Cardiology)  Indicate any recent Medical Services you may have received from other than [facility] providers in the past year (date may be approximate).     Assessment:   This is a routine wellness examination for Math.  Hearing/Vision screen Hearing Screening - Comments:: Patient denies any hearing difficulties.   Vision Screening - Comments:: Wears rx glasses - up to date with routine eye exams  [facility]    Goals Addressed             This Visit's Progress    Patient Stated       Get healthier       Depression Screen    04/06/2023    1:09 PM 02/22/2023    2:18 PM 12/27/2022    9:43 AM 12/26/2022    2:08 PM 11/07/2022    3:19 PM 10/17/2022    3:08 PM 08/22/2022    2:47 PM  PHQ 2/9 Scores  PHQ - 2 Score  6 5 4 6 4 5   PHQ- 9 Score  19 15 16 17 16 13   Exception Documentation Patient refusal          Fall Risk    04/06/2023    1:08 PM 02/22/2023    2:18 PM 02/21/2023    1:51 PM 01/10/2023   12:58 PM 12/27/2022    9:43 AM  Fall Risk   Falls in the past year? 0 0 0 0 0  Number falls in past yr: 0 0 0 0   Injury with Fall? 0 0 0    Risk for fall due to : No Fall Risks   No Fall Risks   Follow up Falls prevention discussed   Falls evaluation completed     MEDICARE RISK AT HOME: Medicare Risk at Home Any stairs in or around the home?: Yes If so, are there any without handrails?: No Home free of loose throw rugs in walkways, pet beds, electrical cords, etc?: Yes Adequate lighting in your home to reduce risk of falls?: Yes Life alert?: No Use of a cane, walker or w/c?: No Grab bars in the bathroom?: Yes Shower chair or bench in shower?: Yes Elevated toilet seat or a handicapped toilet?: Yes  TIMED UP AND GO:  Was the test performed?  No    Cognitive Function:        04/06/2023    1:09 PM 03/31/2022    1:37 PM  6CIT Screen  What  Year? 0 points 0 points  What month? 0 points 0 points  What time? 0 points 0 points  Count back from 20 0 points 0 points  Months in reverse 0 points 0 points  Repeat phrase 0 points 0 points  Total Score 0 points 0 points    Immunizations Immunization History  Administered Date(s) Administered   Fluad Quad(high Dose 65+) 01/24/2021   Fluad Trivalent(High Dose 65+) 02/11/2023   Influenza  Inj Mdck Quad With Preservative 01/30/2018   Influenza Split 02/05/2013   Influenza, High Dose Seasonal PF 01/30/2022   Influenza,inj,Quad PF,6+ Mos 01/20/2014, 01/19/2017   Influenza-Unspecified 01/16/2012, 01/15/2018, 02/12/2018, 01/30/2019, 03/26/2020   Moderna Covid-19 Fall Seasonal Vaccine 13yrs & older 03/06/2022   PFIZER(Purple Top)SARS-COV-2 Vaccination 07/24/2019, 08/25/2019   PNEUMOCOCCAL CONJUGATE-20 03/31/2021   Pneumococcal Polysaccharide-23 04/03/2014, 07/12/2020   Respiratory Syncytial Virus Vaccine,Recomb Aduvanted(Arexvy) 03/24/2022   Td 06/15/2008   Tdap 04/07/2017, 03/29/2022   Zoster Recombinant(Shingrix) 10/01/2020, 02/21/2021   Zoster, Live 12/17/2010    TDAP status: Up to date  Flu Vaccine status: Up to date  Pneumococcal vaccine status: Up to date  Covid-19 vaccine status: Completed vaccines  Qualifies for Shingles Vaccine? No   Zostavax completed Yes   Shingrix Completed?: Yes  Screening Tests Health Maintenance  Topic Date Due   Diabetic kidney evaluation - Urine ACR  08/21/2023   Medicare Annual Wellness (AWV)  08/22/2023   Diabetic kidney evaluation - eGFR measurement  01/18/2024   Colonoscopy  02/17/2027   DTaP/Tdap/Td (4 - Td or Tdap) 03/29/2032   Pneumonia Vaccine 17+ Years old  Completed   INFLUENZA VACCINE  Completed   Hepatitis C Screening  Completed   Zoster Vaccines- Shingrix  Completed   HPV VACCINES  Aged Out   COVID-19 Vaccine  Discontinued    Health Maintenance  There are no preventive care reminders to display for this  patient.  Colorectal cancer screening: Type of screening: Colonoscopy. Completed 02/16/2022. Repeat every 5 years  Lung Cancer Screening: (Low Dose CT Chest recommended if Age 72-80 years, 20 pack-year currently smoking OR have quit w/in 15years.) does not qualify.   Lung Cancer Screening Referral: na  Additional Screening:  Hepatitis C Screening: does not qualify; Completed   Vision Screening: Recommended annual ophthalmology exams for early detection of glaucoma and other disorders of the eye. Is the patient up to date with their annual eye exam?  Yes  Who is the provider or what is the name of the office in which the patient attends [facility] exams? [facility] If pt is not established with a provider, would they like to be referred to a provider to establish care? Yes .   Dental Screening: Recommended annual dental exams for proper oral hygiene  Diabetic Foot Exam: Diabetic Foot Exam: Overdue, Pt has been advised about the importance in completing this exam. Pt is scheduled for diabetic foot exam on provider notified.  Community Resource Referral / Chronic Care Management: CRR required this visit?  No   CCM required this visit?  No     Plan:     I have personally reviewed and noted the following in the patient's chart:   Medical and social history Use of alcohol, tobacco or illicit drugs  Current medications and supplements including opioid prescriptions. Patient is currently taking opioid prescriptions. Information provided to patient regarding non-opioid alternatives. Patient advised to discuss non-opioid treatment plan with their provider. Functional ability and status Nutritional status Physical activity Advanced directives List of other physicians Hospitalizations, surgeries, and ER visits in previous 12 months Vitals Screenings to include cognitive, depression, and falls Referrals and appointments  In addition, I have reviewed and discussed with patient certain  preventive protocols, quality metrics, and best practice recommendations. A written personalized care plan for preventive services as well as general preventive health recommendations were provided to patient.     Jordan Hawks Kensleigh Gates, CMA   04/06/2023   After Visit Summary: (MyChart) Due to  this being a telephonic visit, the after visit summary with patients personalized plan was offered to patient via MyChart   Nurse Notes: na

## 2023-04-06 NOTE — Patient Instructions (Signed)
[name] , Thank you for taking time to come for your Medicare Wellness Visit. I appreciate your ongoing commitment to your health goals. Please review the following plan we discussed and let me know if I can assist you in the future.   Referrals/Orders/Follow-Ups/Clinician Recommendations: Next Medicare Annual Wellness Visit: April 07, 2024 at 9:20 am video visit  This is a list of the screening recommended for you and due dates:  Health Maintenance  Topic Date Due   Eye exam for diabetics  Never done   Complete foot exam   07/12/2021   Hemoglobin A1C  07/19/2023   Yearly kidney health urinalysis for diabetes  08/21/2023   Yearly kidney function blood test for diabetes  01/18/2024   Medicare Annual Wellness Visit  04/05/2024   Colon Cancer Screening  02/17/2027   DTaP/Tdap/Td vaccine (4 - Td or Tdap) 03/29/2032   Pneumonia Vaccine  Completed   Flu Shot  Completed   Hepatitis C Screening  Completed   Zoster (Shingles) Vaccine  Completed   HPV Vaccine  Aged Out   COVID-19 Vaccine  Discontinued    Advanced directives: (Declined) Advance directive discussed with you today. Even though you declined this today, please call our office should you change your mind, and we can give you the proper paperwork for you to fill out.  Next Medicare Annual Wellness Visit scheduled for next year: Yes  Preventive Care 68 Years and Older, Male Preventive care refers to lifestyle choices and visits with your health care provider that can promote health and wellness. Preventive care visits are also called wellness exams. What can I expect for my preventive care visit? Counseling During your preventive care visit, your health care provider may ask about your: Medical history, including: Past medical problems. Family medical history. History of falls. Current health, including: Emotional well-being. Home life and relationship well-being. Sexual activity. Memory and ability to understand  (cognition). Lifestyle, including: Alcohol, nicotine or tobacco, and drug use. Access to firearms. Diet, exercise, and sleep habits. Work and work Astronomer. Sunscreen use. Safety issues such as seatbelt and bike helmet use. Physical exam Your health care provider will check your: Height and weight. These may be used to calculate your BMI (body mass index). BMI is a measurement that tells if you are at a healthy weight. Waist circumference. This measures the distance around your waistline. This measurement also tells if you are at a healthy weight and may help predict your risk of certain diseases, such as type 2 diabetes and high blood pressure. Heart rate and blood pressure. Body temperature. Skin for abnormal spots. What immunizations do I need?  Vaccines are usually given at various ages, according to a schedule. Your health care provider will recommend vaccines for you based on your age, medical history, and lifestyle or other factors, such as travel or where you work. What tests do I need? Screening Your health care provider may recommend screening tests for certain conditions. This may include: Lipid and cholesterol levels. Diabetes screening. This is done by checking your blood sugar (glucose) after you have not eaten for a while (fasting). Hepatitis C test. Hepatitis B test. HIV (human immunodeficiency virus) test. STI (sexually transmitted infection) testing, if you are at risk. Lung cancer screening. Colorectal cancer screening. Prostate cancer screening. Abdominal aortic aneurysm (AAA) screening. You may need this if you are a current or former smoker. Talk with your health care provider about your test results, treatment options, and if necessary, the need for more  tests. Follow these instructions at home: Eating and drinking  Eat a diet that includes fresh fruits and vegetables, whole grains, lean protein, and low-fat dairy products. Limit your intake of foods with  high amounts of sugar, saturated fats, and salt. Take vitamin and mineral supplements as recommended by your health care provider. Do not drink alcohol if your health care provider tells you not to drink. If you drink alcohol: Limit how much you have to 0-2 drinks a day. Know how much alcohol is in your drink. In the [place] one drink equals one 12 oz bottle of beer (355 mL), one 5 oz glass of wine (148 mL), or one 1 oz glass of hard liquor (44 mL). Lifestyle Brush your teeth every morning and night with fluoride toothpaste. Floss one time each day. Exercise for at least 30 minutes 5 or more days each week. Do not use any products that contain nicotine or tobacco. These products include cigarettes, chewing tobacco, and vaping devices, such as e-cigarettes. If you need help quitting, ask your health care provider. Do not use drugs. If you are sexually active, practice safe sex. Use a condom or other form of protection to prevent STIs. Take aspirin only as told by your health care provider. Make sure that you understand how much to take and what form to take. Work with your health care provider to find out whether it is safe and beneficial for you to take aspirin daily. Ask your health care provider if you need to take a cholesterol-lowering medicine (statin). Find healthy ways to manage stress, such as: Meditation, yoga, or listening to music. Journaling. Talking to a trusted person. Spending time with friends and family. Safety Always wear your seat belt while driving or riding in a vehicle. Do not drive: If you have been drinking alcohol. Do not ride with someone who has been drinking. When you are tired or distracted. While texting. If you have been using any mind-altering substances or drugs. Wear a helmet and other protective equipment during sports activities. If you have firearms in your house, make sure you follow all gun safety procedures. Minimize exposure to UV radiation to  reduce your risk of skin cancer. What's next? Visit your health care provider once a year for an annual wellness visit. Ask your health care provider how often you should have your eyes and teeth checked. Stay up to date on all vaccines. This information is not intended to replace advice given to you by your health care provider. Make sure you discuss any questions you have with your health care provider. Document Revised: 09/29/2020 Document Reviewed: 09/29/2020 Elsevier Patient Education  2024 [employer]. Understanding Your Risk for Falls Millions of people have serious injuries from falls each year. It is important to understand your risk of falling. Talk with your health care provider about your risk and what you can do to lower it. If you do have a serious fall, make sure to tell your provider. Falling once raises your risk of falling again. How can falls affect me? Serious injuries from falls are common. These include: Broken bones, such as hip fractures. Head injuries, such as traumatic brain injuries (TBI) or concussions. A fear of falling can cause you to avoid activities and stay at home. This can make your muscles weaker and raise your risk for a fall. What can increase my risk? There are a number of risk factors that increase your risk for falling. The more risk factors you have, the higher  your risk of falling. Serious injuries from a fall happen most often to people who are older than 68 years old. Teenagers and young adults ages 4-29 are also at higher risk. Common risk factors include: Weakness in the lower body. Being generally weak or confused due to long-term (chronic) illness. Dizziness or balance problems. Poor vision. Medicines that cause dizziness or drowsiness. These may include: Medicines for your blood pressure, heart, anxiety, insomnia, or swelling (edema). Pain medicines. Muscle relaxants. Other risk factors include: Drinking alcohol. Having had a fall in  the past. Having foot pain or wearing improper footwear. Working at a dangerous job. Having any of the following in your home: Tripping hazards, such as floor clutter or loose rugs. Poor lighting. Pets. Having dementia or memory loss. What actions can I take to lower my risk of falling?     Physical activity Stay physically fit. Do strength and balance exercises. Consider taking a regular class to build strength and balance. Yoga and tai chi are good options. Vision Have your eyes checked every year and your prescription for glasses or contacts updated as needed. Shoes and walking aids Wear non-skid shoes. Wear shoes that have rubber soles and low heels. Do not wear high heels. Do not walk around the house in socks or slippers. Use a cane or walker as told by your provider. Home safety Attach secure railings on both sides of your stairs. Install grab bars for your bathtub, shower, and toilet. Use a non-skid mat in your bathtub or shower. Attach bath mats securely with double-sided, non-slip rug tape. Use good lighting in all rooms. Keep a flashlight near your bed. Make sure there is a clear path from your bed to the bathroom. Use night-lights. Do not use throw rugs. Make sure all carpeting is taped or tacked down securely. Remove all clutter from walkways and stairways, including extension cords. Repair uneven or broken steps and floors. Avoid walking on icy or slippery surfaces. Walk on the grass instead of on icy or slick sidewalks. Use ice melter to get rid of ice on walkways in the winter. Use a cordless phone. Questions to ask your health care provider Can you help me check my risk for a fall? Do any of my medicines make me more likely to fall? Should I take a vitamin D supplement? What exercises can I do to improve my strength and balance? Should I make an appointment to have my vision checked? Do I need a bone density test to check for weak bones (osteoporosis)? Would it  help to use a cane or a walker? Where to find more information Centers for Disease Control and Prevention, STEADI: TonerPromos.no Community-Based Fall Prevention Programs: TonerPromos.no [employer] on Aging: BaseRingTones.pl Contact a health care provider if: You fall at home. You are afraid of falling at home. You feel weak, drowsy, or dizzy. This information is not intended to replace advice given to you by your health care provider. Make sure you discuss any questions you have with your health care provider. Document Revised: 12/05/2021 Document Reviewed: 12/05/2021 Elsevier Patient Education  2024 Elsevier Inc. Managing Pain Without Opioids Opioids are strong medicines used to treat moderate to severe pain. For some people, especially those who have long-term (chronic) pain, opioids may not be the best choice for pain management due to: Side effects like nausea, constipation, and sleepiness. The risk of addiction (opioid use disorder). The longer you take opioids, the greater your risk of addiction. Pain that lasts for more than  3 months is called chronic pain. Managing chronic pain usually requires more than one approach and is often provided by a team of health care providers working together (multidisciplinary approach). Pain management may be done at a pain management center or pain clinic. How to manage pain without the use of opioids Use non-opioid medicines Non-opioid medicines for pain may include: Over-the-counter or prescription non-steroidal anti-inflammatory drugs (NSAIDs). These may be the first medicines used for pain. They work well for muscle and bone pain, and they reduce swelling. Acetaminophen. This over-the-counter medicine may work well for milder pain but not swelling. Antidepressants. These may be used to treat chronic pain. A certain type of antidepressant (tricyclics) is often used. These medicines are given in lower doses for pain than when used for  depression. Anticonvulsants. These are usually used to treat seizures but may also reduce nerve (neuropathic) pain. Muscle relaxants. These relieve pain caused by sudden muscle tightening (spasms). You may also use a pain medicine that is applied to the skin as a patch, cream, or gel (topical analgesic), such as a numbing medicine. These may cause fewer side effects than medicines taken by mouth. Do certain therapies as directed Some therapies can help with pain management. They include: Physical therapy. You will do exercises to gain strength and flexibility. A physical therapist may teach you exercises to move and stretch parts of your body that are weak, stiff, or painful. You can learn these exercises at physical therapy visits and practice them at home. Physical therapy may also involve: Massage. Heat wraps or applying heat or cold to affected areas. Electrical signals that interrupt pain signals (transcutaneous electrical nerve stimulation, TENS). Weak lasers that reduce pain and swelling (low-level laser therapy). Signals from your body that help you learn to regulate pain (biofeedback). Occupational therapy. This helps you to learn ways to function at home and work with less pain. Recreational therapy. This involves trying new activities or hobbies, such as a physical activity or drawing. Mental health therapy, including: Cognitive behavioral therapy (CBT). This helps you learn coping skills for dealing with pain. Acceptance and commitment therapy (ACT) to change the way you think and react to pain. Relaxation therapies, including muscle relaxation exercises and mindfulness-based stress reduction. Pain management counseling. This may be individual, family, or group counseling.  Receive medical treatments Medical treatments for pain management include: Nerve block injections. These may include a pain blocker and anti-inflammatory medicines. You may have injections: Near the spine to  relieve chronic back or neck pain. Into joints to relieve back or joint pain. Into nerve areas that supply a painful area to relieve body pain. Into muscles (trigger point injections) to relieve some painful muscle conditions. A medical device placed near your spine to help block pain signals and relieve nerve pain or chronic back pain (spinal cord stimulation device). Acupuncture. Follow these instructions at home Medicines Take over-the-counter and prescription medicines only as told by your health care provider. If you are taking pain medicine, ask your health care providers about possible side effects to watch out for. Do not drive or use heavy machinery while taking prescription opioid pain medicine. Lifestyle  Do not use drugs or alcohol to reduce pain. If you drink alcohol, limit how much you have to: 0-1 drink a day for women who are not pregnant. 0-2 drinks a day for men. Know how much alcohol is in a drink. In the [place] one drink equals one 12 oz bottle of beer (355 mL), one 5 oz  glass of wine (148 mL), or one 1 oz glass of hard liquor (44 mL). Do not use any products that contain nicotine or tobacco. These products include cigarettes, chewing tobacco, and vaping devices, such as e-cigarettes. If you need help quitting, ask your health care provider. Eat a healthy diet and maintain a healthy weight. Poor diet and excess weight may make pain worse. Eat foods that are high in fiber. These include fresh fruits and vegetables, whole grains, and beans. Limit foods that are high in fat and processed sugars, such as fried and sweet foods. Exercise regularly. Exercise lowers stress and may help relieve pain. Ask your health care provider what activities and exercises are safe for you. If your health care provider approves, join an exercise class that combines movement and stress reduction. Examples include yoga and tai chi. Get enough sleep. Lack of sleep may make pain worse. Lower stress  as much as possible. Practice stress reduction techniques as told by your therapist. General instructions Work with all your pain management providers to find the treatments that work best for you. You are an important member of your pain management team. There are many things you can do to reduce pain on your own. Consider joining an online or in-person support group for people who have chronic pain. Keep all follow-up visits. This is important. Where to find more information You can find more information about managing pain without opioids from: American Academy of Pain Medicine: painmed.org Institute for Chronic Pain: instituteforchronicpain.org American Chronic Pain Association: theacpa.org Contact a health care provider if: You have side effects from pain medicine. Your pain gets worse or does not get better with treatments or home therapy. You are struggling with anxiety or depression. Summary Many types of pain can be managed without opioids. Chronic pain may respond better to pain management without opioids. Pain is best managed when you and a team of health care providers work together. Pain management without opioids may include non-opioid medicines, medical treatments, physical therapy, mental health therapy, and lifestyle changes. Tell your health care providers if your pain gets worse or is not being managed well enough. This information is not intended to replace advice given to you by your health care provider. Make sure you discuss any questions you have with your health care provider. Document Revised: 07/14/2020 Document Reviewed: 07/14/2020 Elsevier Patient Education  2024 [employer].

## 2023-04-13 ENCOUNTER — Encounter (HOSPITAL_BASED_OUTPATIENT_CLINIC_OR_DEPARTMENT_OTHER): Payer: Self-pay | Admitting: Emergency Medicine

## 2023-04-13 ENCOUNTER — Other Ambulatory Visit: Payer: Self-pay

## 2023-04-13 ENCOUNTER — Emergency Department (HOSPITAL_BASED_OUTPATIENT_CLINIC_OR_DEPARTMENT_OTHER)
Admission: EM | Admit: 2023-04-13 | Discharge: 2023-04-13 | Discharge status: Against Medical Advice | Payer: No Typology Code available for payment source | Attending: Emergency Medicine | Admitting: Emergency Medicine

## 2023-04-13 ENCOUNTER — Other Ambulatory Visit: Payer: Self-pay | Admitting: Family Medicine

## 2023-04-13 ENCOUNTER — Emergency Department (HOSPITAL_BASED_OUTPATIENT_CLINIC_OR_DEPARTMENT_OTHER): Payer: No Typology Code available for payment source

## 2023-04-13 DIAGNOSIS — Z5321 Procedure and treatment not carried out due to patient leaving prior to being seen by health care provider: Secondary | ICD-10-CM | POA: Insufficient documentation

## 2023-04-13 DIAGNOSIS — M79605 Pain in left leg: Secondary | ICD-10-CM | POA: Insufficient documentation

## 2023-04-13 NOTE — ED Triage Notes (Signed)
Pt referred by pcp, reports need for [place] to r/o DVT. Pt c/o LT posterior knee pain intermittently. Denies swelling, denies injury

## 2023-04-17 ENCOUNTER — Telehealth: Payer: Self-pay | Admitting: Cardiology

## 2023-04-17 NOTE — Telephone Encounter (Signed)
Noted Will route to provider as FYI.

## 2023-04-17 NOTE — Telephone Encounter (Signed)
Patient called to let his cardiology doctors know he will be followed for cardiology at the [facility].

## 2023-04-20 ENCOUNTER — Encounter: Payer: Medicare Other | Admitting: Cardiovascular Disease

## 2023-04-25 ENCOUNTER — Ambulatory Visit: Payer: Medicare Other | Admitting: Student in an Organized Health Care Education/Training Program

## 2023-04-26 ENCOUNTER — Ambulatory Visit (INDEPENDENT_AMBULATORY_CARE_PROVIDER_SITE_OTHER): Payer: No Typology Code available for payment source | Admitting: Family Medicine

## 2023-04-26 ENCOUNTER — Ambulatory Visit: Payer: Self-pay | Admitting: General Surgery

## 2023-04-26 VITALS — BP 133/82 | HR 63 | Ht 71.0 in | Wt 220.0 lb

## 2023-04-26 DIAGNOSIS — I10 Essential (primary) hypertension: Secondary | ICD-10-CM

## 2023-04-26 DIAGNOSIS — N401 Enlarged prostate with lower urinary tract symptoms: Secondary | ICD-10-CM

## 2023-04-26 DIAGNOSIS — E785 Hyperlipidemia, unspecified: Secondary | ICD-10-CM

## 2023-04-26 DIAGNOSIS — E118 Type 2 diabetes mellitus with unspecified complications: Secondary | ICD-10-CM | POA: Diagnosis not present

## 2023-04-26 DIAGNOSIS — E1169 Type 2 diabetes mellitus with other specified complication: Secondary | ICD-10-CM | POA: Diagnosis not present

## 2023-04-26 DIAGNOSIS — R5383 Other fatigue: Secondary | ICD-10-CM

## 2023-04-26 DIAGNOSIS — R350 Frequency of micturition: Secondary | ICD-10-CM | POA: Diagnosis not present

## 2023-04-26 NOTE — Progress Notes (Addendum)
 Surgical Instructions   Your procedure is scheduled on Tuesday, January 14th, 2025. Report to [facility] Main Entrance A at 5:30 A.M., then check in with the Admitting office. Any questions or running late day of surgery: call [phone]  Questions prior to your surgery date: call [phone], Monday-Friday, 8am-4pm. If you experience any cold or flu symptoms such as cough, fever, chills, shortness of breath, etc. between now and your scheduled surgery, please notify us  at the above number.     Remember:  Do not eat or drink after midnight the night before your surgery    Take these medicines the morning of surgery with A SIP OF WATER: Amlodipine  (Norvasc ) Esomeprazole  (Nexium ) Isosorbide  Mononitrate (Imdur ) Propranolol  ER (Inderal ) Ranolazine  (Ranexa ) Rosuvastatin  (Crestor )   May take these medicines IF NEEDED: Acetaminophen  (Tylenol ) Albuterol  Inhaler (please bring with you on the day of surgery) Alprazolam  (Xanax ) Hydromorphone  (Dilaudid ) Nitroglycerin  (if you take this medicine, please notify a nurse at [phone]) Ondansetron  (Zofran )  Follow your surgeon's instruction on when to stop your Aspirin .    One week prior to surgery, STOP taking any  Aleve , Naproxen , Ibuprofen , Motrin , Advil , Goody's, BC's, all herbal medications, fish oil, and non-prescription vitamins.    WHAT DO I DO ABOUT MY DIABETES MEDICATION?   Do not take oral diabetes medicines (pills) the morning of surgery. Empagliflozin-metformin  should be held for 72 hours prior to surgery.  Last dose should be the morning of 1/11.   HOW TO MANAGE YOUR DIABETES BEFORE AND AFTER SURGERY  Why is it important to control my blood sugar before and after surgery? Improving blood sugar levels before and after surgery helps healing and can limit problems. A way of improving blood sugar control is eating a healthy diet by:  Eating less sugar and carbohydrates  Increasing activity/exercise  Talking with  your doctor about reaching your blood sugar goals High blood sugars (greater than 180 mg/dL) can raise your risk of infections and slow your recovery, so you will need to focus on controlling your diabetes during the weeks before surgery. Make sure that the doctor who takes care of your diabetes knows about your planned surgery including the date and location.  How do I manage my blood sugar before surgery? Check your blood sugar at least 4 times a day, starting 2 days before surgery, to make sure that the level is not too high or low.  Check your blood sugar the morning of your surgery when you wake up and every 2 hours until you get to the Short Stay unit.  If your blood sugar is less than 70 mg/dL, you will need to treat for low blood sugar: Do not take insulin . Treat a low blood sugar (less than 70 mg/dL) with  cup of clear juice (cranberry or apple), 4 glucose tablets, OR glucose gel. Recheck blood sugar in 15 minutes after treatment (to make sure it is greater than 70 mg/dL). If your blood sugar is not greater than 70 mg/dL on recheck, call [phone] for further instructions. Report your blood sugar to the short stay nurse when you get to Short Stay.  If you are admitted to the hospital after surgery: Your blood sugar will be checked by the staff and you will probably be given insulin  after surgery (instead of oral diabetes medicines) to make sure you have good blood sugar levels. The goal for blood sugar control after surgery is 80-180 mg/dL.  Do NOT Smoke (Tobacco/Vaping) for 24 hours prior to your procedure.  If you use a CPAP at night, you may bring your mask/headgear for your overnight stay.   You will be asked to remove any contacts, glasses, piercing's, hearing aid's, dentures/partials prior to surgery. Please bring cases for these items if needed.    Patients discharged the day of surgery will not be allowed to drive home, and someone needs to stay  with them for 24 hours.  SURGICAL WAITING ROOM VISITATION Patients may have no more than 2 support people in the waiting area - these visitors may rotate.   Pre-op nurse will coordinate an appropriate time for 1 ADULT support person, who may not rotate, to accompany patient in pre-op.  Children under the age of 57 must have an adult with them who is not the patient and must remain in the main waiting area with an adult.  If the patient needs to stay at the hospital during part of their recovery, the visitor guidelines for inpatient rooms apply.  Please refer to the [facility] website for the visitor guidelines for any additional information.   If you received a COVID test during your pre-op visit  it is requested that you wear a mask when out in public, stay away from anyone that may not be feeling well and notify your surgeon if you develop symptoms. If you have been in contact with anyone that has tested positive in the last 10 days please notify you surgeon.      Pre-operative CHG Bathing Instructions   You can play a key role in reducing the risk of infection after surgery. Your skin needs to be as free of germs as possible. You can reduce the number of germs on your skin by washing with CHG (chlorhexidine  gluconate) soap before surgery. CHG is an antiseptic soap that kills germs and continues to kill germs even after washing.   DO NOT use if you have an allergy to chlorhexidine /CHG or antibacterial soaps. If your skin becomes reddened or irritated, stop using the CHG and notify one of our RNs at [phone].              TAKE A SHOWER THE NIGHT BEFORE SURGERY AND THE DAY OF SURGERY    Please keep in mind the following:  DO NOT shave, including legs and underarms, 48 hours prior to surgery.   You may shave your face before/day of surgery.  Place clean sheets on your bed the night before surgery Use a clean washcloth (not used since being washed) for each shower. DO NOT sleep with  pet's night before surgery.  CHG Shower Instructions:  Wash your face and private area with normal soap. If you choose to wash your hair, wash first with your normal shampoo.  After you use shampoo/soap, rinse your hair and body thoroughly to remove shampoo/soap residue.  Turn the water OFF and apply half the bottle of CHG soap to a CLEAN washcloth.  Apply CHG soap ONLY FROM YOUR NECK DOWN TO YOUR TOES (washing for 3-5 minutes)  DO NOT use CHG soap on face, private areas, open wounds, or sores.  Pay special attention to the area where your surgery is being performed.  If you are having back surgery, having someone wash your back for you may be helpful. Wait 2 minutes after CHG soap is applied, then you may rinse off the CHG soap.  Pat dry with a clean towel  Put on clean pajamas  Additional instructions for the day of surgery: DO NOT APPLY any lotions, deodorants, cologne, or perfumes.   Do not wear jewelry or makeup Do not wear nail polish, gel polish, artificial nails, or any other type of covering on natural nails (fingers and toes) Do not bring valuables to the hospital. [facility] is not responsible for valuables/personal belongings. Put on clean/comfortable clothes.  Please brush your teeth.  Ask your nurse before applying any prescription medications to the skin.

## 2023-04-26 NOTE — Progress Notes (Signed)
   Subjective:    Patient ID: [name], male    DOB: Jul 04, 1955, 68 y.o.   MRN: [id number]  HPI Patient here today with several concerns He states he is having BPH symptoms with urinary frequency that hits him multiple times every day and night.  He relates no hematuria.  No dysuria.  [facility] including pac[facility]  Patient at time seen dark images in the corner of his vision that sometimes move this occurs more so at nighttime when he gets up to urinate does not occur during the day occurs more in dimly lit circumstances had previous eye exam within the past year which was good   Review of Systems     Objective:   Physical Exam General-in no acute distress Eyes-no discharge Lungs-respiratory rate normal, CTA CV-no murmurs Extremities skin warm dry no edema Neuro grossly normal Behavior normal, alert        Assessment & Plan:  1. Benign prostatic hyperplasia with urinary frequency (Primary) Patient would like to be sent to [facility] They do a artery embolization of the prostate to help shrink the prostate We will help set this up - Ambulatory referral to Urology  2. DM type 2, controlled, with complication [facility]) Patient taking medication as directed, his [facility] doctor put him on a combination of Jardiance with metformin , patient was warned never to take this medication if dehydrated or vomiting diarrhea we will check lab work and share with the patient he can share with his [facility] doctor - Hemoglobin A1c  3. Hyperlipidemia associated with type 2 diabetes mellitus (HCC) Healthy diet Continue statin Continue healthy eating and activity - Basic Metabolic Panel  4. Primary hypertension Blood pressure good control check metabolic 7 - Basic Metabolic Panel  5. Other fatigue Patient complains of moderate fatigue check for anemia - CBC with Differential  Patient has follow-up later this spring

## 2023-04-27 ENCOUNTER — Other Ambulatory Visit: Payer: Self-pay | Admitting: Family Medicine

## 2023-04-27 ENCOUNTER — Encounter: Payer: Self-pay | Admitting: Family Medicine

## 2023-04-27 ENCOUNTER — Encounter: Payer: Self-pay | Admitting: Cardiovascular Disease

## 2023-04-27 ENCOUNTER — Encounter: Payer: Medicare Other | Admitting: Cardiovascular Disease

## 2023-04-27 ENCOUNTER — Encounter (HOSPITAL_COMMUNITY)
Admission: RE | Admit: 2023-04-27 | Discharge: 2023-04-27 | Disposition: A | Discharge status: Home / Self Care | Payer: Medicare Other | Source: Ambulatory Visit | Attending: General Surgery | Admitting: General Surgery

## 2023-04-27 ENCOUNTER — Other Ambulatory Visit: Payer: Self-pay

## 2023-04-27 ENCOUNTER — Encounter (HOSPITAL_COMMUNITY): Payer: Self-pay

## 2023-04-27 VITALS — BP 131/98 | HR 64 | Temp 97.8°F | Resp 18 | Ht 71.0 in | Wt 217.9 lb

## 2023-04-27 DIAGNOSIS — Z01812 Encounter for preprocedural laboratory examination: Secondary | ICD-10-CM | POA: Diagnosis not present

## 2023-04-27 DIAGNOSIS — E119 Type 2 diabetes mellitus without complications: Secondary | ICD-10-CM | POA: Diagnosis not present

## 2023-04-27 DIAGNOSIS — Z01818 Encounter for other preprocedural examination: Secondary | ICD-10-CM

## 2023-04-27 HISTORY — DX: Other complications of anesthesia, initial encounter: T88.59XA

## 2023-04-27 HISTORY — DX: Dyspnea, unspecified: R06.00

## 2023-04-27 LAB — BASIC METABOLIC PANEL
Anion gap: 9 (ref 5–15)
BUN: 18 mg/dL (ref 8–23)
CO2: 27 mmol/L (ref 22–32)
Calcium: 9.7 mg/dL (ref 8.9–10.3)
Chloride: 104 mmol/L (ref 98–111)
Creatinine, Ser: 0.99 mg/dL (ref 0.61–1.24)
GFR, Estimated: >60 mL/min (ref >60)
Glucose, Bld: 127 mg/dL — ABNORMAL HIGH (ref 70–99)
Potassium: 4.4 mmol/L (ref 3.5–5.1)
Sodium: 140 mmol/L (ref 135–145)

## 2023-04-27 LAB — CBC WITH DIFFERENTIAL/PLATELET
Abs Immature Granulocytes: 0.03 10*3/uL (ref 0.00–0.07)
Basophils Absolute: 0.1 10*3/uL (ref 0.0–0.1)
Basophils Relative: 1 %
Eosinophils Absolute: 0.3 10*3/uL (ref 0.0–0.5)
Eosinophils Relative: 4 %
HCT: 43 % (ref 39.0–52.0)
Hemoglobin: 14.3 g/dL (ref 13.0–17.0)
Immature Granulocytes: 0 %
Lymphocytes Relative: 25 %
Lymphs Abs: 1.7 10*3/uL (ref 0.7–4.0)
MCH: 29.9 pg (ref 26.0–34.0)
MCHC: 33.3 g/dL (ref 30.0–36.0)
MCV: 90 fL (ref 80.0–100.0)
Monocytes Absolute: 0.5 10*3/uL (ref 0.1–1.0)
Monocytes Relative: 8 %
Neutro Abs: 4.1 10*3/uL (ref 1.7–7.7)
Neutrophils Relative %: 62 %
Platelets: 197 10*3/uL (ref 150–400)
RBC: 4.78 MIL/uL (ref 4.22–5.81)
RDW: 13.3 % (ref 11.5–15.5)
WBC: 6.7 10*3/uL (ref 4.0–10.5)
nRBC: 0 % (ref 0.0–0.2)

## 2023-04-27 LAB — GLUCOSE, CAPILLARY: Glucose-Capillary: 115 mg/dL — ABNORMAL HIGH (ref 70–99)

## 2023-04-27 LAB — HEMOGLOBIN A1C
Hgb A1c MFr Bld: 6.9 % — ABNORMAL HIGH (ref 4.8–5.6)
Mean Plasma Glucose: 151.33 mg/dL

## 2023-04-27 MED ORDER — ALPRAZOLAM 0.5 MG PO TABS: ORAL_TABLET | ORAL | 0 refills | Status: DC

## 2023-04-27 NOTE — Progress Notes (Signed)
 PERIOPERATIVE PRESCRIPTION FOR IMPLANTED CARDIAC DEVICE PROGRAMMING  Patient Information: Name:  [name]  DOB:  03-May-1955  MRN:  [id number]    Planned Procedure:  Robotic Assisted Bilateral Inguinal hernia with mesh  Surgeon:  Dr. Polly  Date of Procedure:  05/01/23 at 0730  Cautery will be used.  Position during surgery:  supine   Please send documentation back to:  [facility] (Fax # [phone])  Device Information:  Clinic EP Physician:  Dr. Eulas Furbish   Device Type:  Pacemaker Manufacturer and Phone #:  Medtronic: [id number] Pacemaker Dependent?:  Yes.   Date of Last Device Check:  01/29/2023 Normal Device Function?:  Janiece.  /  Electrophysiologist's Recommendations:  Have magnet available. Provide continuous ECG monitoring when magnet is used or reprogramming is to be performed.  Procedure may interfere with device function.  Magnet should be placed over device during procedure.  Per Device Clinic Standing Orders, Almarie ONEIDA Shutter, RN  4:56 PM 04/27/2023

## 2023-04-27 NOTE — Progress Notes (Signed)
 PCP - Dr. Glendia Fielding Cardiologist - Dr. Dorn Ross but recently moved to Dr. Ripley Borg at the [facility] Electrophysiologist - Dr. Irene Mealor but recently moved to Dr. Dotty Sima   PPM/ICD - Medtronic Pacemaker Device Orders - sent to both [facility] and [place] in [place] Rep Notified -  -Patient states that pacemaker will need to be replaced in June or July of this year  Chest x-ray - denies EKG - 01/12/23 Stress Test - denies ECHO - 04/06/22 - Cardiac Cath - 09/01/2021  Sleep Study - denies CPAP - n/a  Fasting Blood Sugar - patient does not check blood sugars at home  Last dose of GLP1 agonist-  n/a GLP1 instructions: n/a  Blood Thinner Instructions: n/a Aspirin  Instructions: last dose of Aspirin  1/10  ERAS Protcol - NPO   COVID TEST- n/a   Anesthesia review: yes- CAD, pacemaker, HTN  Patient denies shortness of breath, fever, cough and chest pain at PAT appointment   All instructions explained to the patient, with a verbal understanding of the material. Patient agrees to go over the instructions while at home for a better understanding. Patient also instructed to self quarantine after being tested for COVID-19. The opportunity to ask questions was provided.

## 2023-04-29 ENCOUNTER — Encounter: Payer: Self-pay | Admitting: Family Medicine

## 2023-04-30 NOTE — Anesthesia Preprocedure Evaluation (Addendum)
 Anesthesia Evaluation  Patient identified by MRN, date of birth, ID band Patient awake    Reviewed: Allergy & Precautions, NPO status , Patient's Chart, lab work & pertinent test results  Airway Mallampati: II  TM Distance: >3 FB Neck ROM: Full    Dental no notable dental hx.    Pulmonary sleep apnea , former smoker   Pulmonary exam normal breath sounds clear to auscultation       Cardiovascular hypertension, Pt. on medications and Pt. on home beta blockers + angina  + CAD and + Cardiac Stents (x 2)  Normal cardiovascular exam+ pacemaker  Rhythm:Regular Rate:Normal     Neuro/Psych  PSYCHIATRIC DISORDERS Anxiety Depression     Neuromuscular disease    GI/Hepatic Neg liver ROS,GERD  Medicated and Controlled,,  Endo/Other  diabetes, Oral Hypoglycemic Agents    Renal/GU negative Renal ROS     Musculoskeletal  (+) Arthritis ,    Abdominal   Peds  (+) ATTENTION DEFICIT DISORDER WITHOUT HYPERACTIVITY Hematology negative hematology ROS (+)   Anesthesia Other Findings BILATERAL INGUINAL HERNIA  Reproductive/Obstetrics                             Anesthesia Physical Anesthesia Plan  ASA: 4  Anesthesia Plan: General and Regional   Post-op Pain Management: Regional block*   Induction: Intravenous  PONV Risk Score and Plan: 2 and Ondansetron , Dexamethasone , Midazolam  and Treatment may vary due to age or medical condition  Airway Management Planned: Oral ETT  Additional Equipment: ClearSight  Intra-op Plan:   Post-operative Plan: Extubation in OR  Informed Consent: I have reviewed the patients History and Physical, chart, labs and discussed the procedure including the risks, benefits and alternatives for the proposed anesthesia with the patient or authorized representative who has indicated his/her understanding and acceptance.     Dental advisory given  Plan Discussed with:  CRNA  Anesthesia Plan Comments: (PAT note written 04/30/2023 by Allison Zelenak, PA-C.  )       Anesthesia Quick Evaluation

## 2023-04-30 NOTE — Progress Notes (Signed)
 Device orders received on 1/10 at 1656.

## 2023-04-30 NOTE — Progress Notes (Signed)
 Anesthesia Chart Review:  Case: [id number] Date/Time: 05/01/23 0715   Procedure: XI ROBOTIC ASSISTED BILATERAL INGUINAL HERNIA WITH MESH (Bilateral)   Anesthesia type: General   Pre-op diagnosis: BILATERAL INGUINAL HERNIA   [facility] / [facility]   Surgeons: Polly Cordella LABOR, MD       DISCUSSION: Patient is a 68 year old male scheduled for the above procedure.  History includes former smoker (quit 12/20/03), HTN, HLD, CAD (DES mid RCA 10/17/12; DES mid LAD 08/22/18, RCA PTCA for ISR 06/15/20, palpitations (PVCs), CHB (s/p dual chamber Medtronic Adapta PPM 12/17/12), OSA (mild-moderate OSA 2015; does not use CPAP), DM2, GERD, GI bleed (2019), exertional dyspnea, PTSD, spinal surgery (C5-7 ACDF 07/12/00). Can wake up fighting after anesthesia.   PAT RN notes suggest he is transferring cardiology care to the [facility]. Last [facility] visit noted is from 02/12/23 with Miriam [name], NP. He was seen for follow-up and preoperative evaluation. Was prescribed amiodarone  in May by EP for symptomatic PVCs, but at September visit admitted to not starting due to side effect concerns. He requested to be started back on Ranexa  to improve control of chronic chest pain and DOE symptoms. Had not required nitroglycerin . He had mild to moderate CAD, medical therapy recommended by 08/2021 [facility]. 04/06/22 echo showed LVEF 60-65%, no regional wall motion abnormalities, mild LVH, normal RV systolic function, trivial MR. At 02/12/23 visit, he denied chest pain. On Ranexa , Inderal  LA, Crestor , losartan , Imdur , Nexium , amlodipine , ASA 81 mg, Synjardy, albuterol  as needed, Xanax  as needed. She wrote,  Pre-operative cardiovascular clearance Mr. Manton's perioperative risk of a major cardiac event is 6.6 % according to the Revised Cardiac Risk Index (RCRI).  Therefore, he is at high risk for perioperative complications.   His functional capacity is excellent at > 4 METs according to the Duke Activity Status Index  (DASI). Recommendations: According to ACC/AHA guidelines, no further cardiovascular testing needed.  The patient may proceed to surgery at acceptable risk.   Antiplatelet and/or Anticoagulation Recommendations: Prefer aspirin  to be continued unless surgeon feels bleeding risk is too high.  If surgeon feels that bleeding risk is too high, okay to hold aspirin  for 7 days prior to procedure, then resume when felt safe to do so.  Will route note/fax to requesting party.    Device Information: Clinic EP Physician:  Dr. Eulas Furbish  Device Type:  Pacemaker Manufacturer and Phone #:  Medtronic: [phone] Pacemaker Dependent?:  Yes.   Date of Last Device Check:  01/29/2023     Normal Device Function?:  Janiece.  /   Electrophysiologist's Recommendations: Have magnet available. Provide continuous ECG monitoring when magnet is used or reprogramming is to be performed.  Procedure may interfere with device function.  Magnet should be placed over device during procedure.  Anesthesia team to evaluate on the day of surgery.    VS: BP (!) 131/98   Pulse 64   Temp 36.6 C   Resp 18   Ht 5' 11 (1.803 m)   Wt 98.8 kg   SpO2 99%   BMI 30.39 kg/m    PROVIDERS: Alphonsa Glendia LABOR, MD is PCP  Cardiologist - Dr. Dorn Ross but recently moved to Dr. Ripley Borg at the [facility] Electrophysiologist - Dr. Irene Mealor but will be moving EP care to the [facility] with Dr. Maire or Dr. Dotty Sima   LABS: Labs reviewed: Acceptable for surgery. (all labs ordered are listed, but only abnormal results are displayed)  Labs Reviewed  GLUCOSE, CAPILLARY -  Abnormal; Notable for the following components:      Result Value   Glucose-Capillary 115 (*)    All other components within normal limits  HEMOGLOBIN A1C - Abnormal; Notable for the following components:   Hgb A1c MFr Bld 6.9 (*)    All other components within normal limits  BASIC METABOLIC PANEL - Abnormal; Notable for the following  components:   Glucose, Bld 127 (*)    All other components within normal limits  CBC WITH DIFFERENTIAL/PLATELET    Sleep Study 11/07/13: IMPRESSION/ RECOMMENDATION:  1) mild to moderate obstructive and central sleep apnea, with an AHI of 18 events per hour and oxygen desaturation as low as 85%. Treatment for this degree of sleep apnea can include a trial of weight loss alone, upper airway surgery, dental appliance, and also CPAP. Clinical correlation is suggested. 2) rare fusion beat noted, but no clinically significant arrhythmias were seen.   IMAGES: CT C-spine 01/10/23: IMPRESSION: 1. Unchanged postoperative appearance from prior C5-C7 ACDF. No evidence of hardware complication. 2. Adjacent segment degeneration at C4-C5 with mild spinal canal stenosis and moderate bilateral neural foraminal narrowing. 3. Moderate left neural foraminal narrowing at C2-C3 and C3-C4. 4. Moderate right neural foraminal narrowing at C7-T1.      EKG: 01/12/23: Atrial-sensed ventricular-paced rhythm When compared with ECG of 22-Feb-2022 08:52, PREVIOUS ECG IS PRESENT Confirmed by Miriam [name] [phone]) on 01/12/2023 4:04:33 PM   CV: LLE Venous US  04/17/23 ([facility]) Impression: 1. No evidence of DVT.    Holter monitor 24-48 Hours 11/01/22 ([facility]): Conclusions: The patient was monitored for 48 hours.  Data quality is adequate.  The predominant rhythm is atrial sensed, ventricular paced.  The average heart rate is 67 bpm.  Minimum heart rate is 58 bpm with a maximum heart rate of 125 bpm.  There are   rare PVCs which represent well under 1% of all beats.  There is 1 ventricular couplet and one 9 beat run of ventricular tachycardia at a rate of 125 bpm.  There are rare PACs with no SVT.  There are no significant pauses.  There were no patient  triggered recordings.    Echo 04/06/22: IMPRESSIONS   1. Left ventricular ejection fraction, by estimation, is 60 to 65%. The  left ventricle has normal  function. The left ventricle has no regional  wall motion abnormalities. There is mild left ventricular hypertrophy.  Left ventricular diastolic parameters  are indeterminate. The average left ventricular global longitudinal strain  is -17.8 %. The global longitudinal strain is normal.   2. Right ventricular systolic function is normal. The right ventricular  size is normal. Tricuspid regurgitation signal is inadequate for assessing  PA pressure.   3. The mitral valve is normal in structure. Trivial mitral valve  regurgitation. No evidence of mitral stenosis.   4. The tricuspid valve is abnormal.   5. The aortic valve is tricuspid. There is moderate calcification of the  aortic valve. There is moderate thickening of the aortic valve. Aortic  valve regurgitation is not visualized. No aortic stenosis is present.   6. The inferior vena cava is dilated in size with >50% respiratory  variability, suggesting right atrial pressure of 8 mmHg.  - Comparison(s): Echocardiogram done 06/20/19 showed an EF of 60-65%.    Cardiac cath 09/01/21:   Prox LAD lesion is 50% stenosed.   Prox Cx lesion is 10% stenosed.   Ost Cx lesion is 10% stenosed.   Dist Cx lesion is 30% stenosed.  Mid RCA lesion is 5% stenosed.   Non-stenotic Mid LAD lesion was previously treated.   The left ventricular systolic function is normal.   LV end diastolic pressure is normal.   The left ventricular ejection fraction is greater than 65% by visual estimate.   Mild -moderate CAD with previously noted smooth 50% LAD stenosis after the first diagonal and septal perforating artery with a widely patent mid LAD stent; mild nonobstructive left circumflex stenosis in a co-dominant vessel; and superior takeoff RCA with smooth very mild 5% intimal hyperplasia within the mid RCA stent.   Hyperdynamic LV function with EF estimated at least 65%.  LVEDP 11 mmHg.   RECOMMENDATION: Medical therapy.     Past Medical History:  Diagnosis  Date   ADD (attention deficit disorder)    Rx-Adderall   Anginal pain (HCC)    Anxiety    on meds   Aortic atherosclerosis (HCC) 05/27/2020   Seen on x-ray being treated with statin   AV block, 3rd degree (HCC)    Colon polyps 2011   tubular adenoma by excisional biopsy during colonoscopy   Complete heart block (HCC)    a. s/p PPM Placement in 12/2012 with Medtronic CRT-P placement in 03/2014   Complication of anesthesia    woke up fighting   Coronary artery disease    a. s/p PCI of the RCA in 2014 b. 08/2018: patent RCA stent with new mid-LAD 85% stenosis (treated with PCI/DES) and 85% distal OM2 stenosis (med management recommended)   Degenerative disc disease, cervical    Depression    PTSD   DM (diabetes mellitus) (HCC)    on meds   Dyspnea    with exertion   Gastrointestinal bleeding 10/15/2017   GERD (gastroesophageal reflux disease)    Hyperlipidemia    on meds   Hypertension    on meds   Pacemaker    Palpitations 2003   Holter in 2003: PACs and PVCs; negative stress nuclear test in 2005; right bundle branch block; echo in 2008-mild LVH, otherwise normal; 2008-negative stress nuclear test.   Pneumonia    Prostatitis    prostate calcifications by CT   Seasonal allergies    Sleep apnea    questionable diagnosis    Past Surgical History:  Procedure Laterality Date   ANTERIOR CERVICAL DECOMP/DISCECTOMY FUSION  2002   Biventricular pacemaker upgrade/ system revision  04/02/2014   removal of previous atrial and ventricular leads with placement of a new MDT Consulta CRT-P system at [facility] by Dr Guadlupe   COLONOSCOPY  2017   MS-MAC-suprep(good)-leiomyoma/TA   COLONOSCOPY W/ POLYPECTOMY  2011   tubular adenoma   CORONARY BALLOON ANGIOPLASTY N/A 06/15/2020   Procedure: CORONARY BALLOON ANGIOPLASTY;  Surgeon: Dann Candyce RAMAN, MD;  Location: [facility] INVASIVE CV LAB;  Service: Cardiovascular;  Laterality: N/A;   CORONARY STENT INTERVENTION N/A  08/22/2018   Procedure: CORONARY STENT INTERVENTION;  Surgeon: Burnard Debby LABOR, MD;  Location: [facility] INVASIVE CV LAB;  Service: Cardiovascular;  Laterality: N/A;   CORONARY ULTRASOUND/IVUS N/A 06/15/2020   Procedure: Intravascular Ultrasound/IVUS;  Surgeon: Dann Candyce RAMAN, MD;  Location: [facility] INVASIVE CV LAB;  Service: Cardiovascular;  Laterality: N/A;   ESOPHAGOGASTRODUODENOSCOPY     Gastritis   INSERT / REPLACE / REMOVE PACEMAKER  12/17/2012   MDT Adapta L implanted by Dr Kelsie for mobitz II second degree AV block   KNEE SURGERY Right    LEFT HEART CATH AND CORONARY ANGIOGRAPHY N/A 08/22/2018   Procedure:  LEFT HEART CATH AND CORONARY ANGIOGRAPHY;  Surgeon: Burnard Debby LABOR, MD;  Location: [facility] INVASIVE CV LAB;  Service: Cardiovascular;  Laterality: N/A;   LEFT HEART CATH AND CORONARY ANGIOGRAPHY N/A 06/15/2020   Procedure: LEFT HEART CATH AND CORONARY ANGIOGRAPHY;  Surgeon: Dann Candyce RAMAN, MD;  Location: [facility] INVASIVE CV LAB;  Service: Cardiovascular;  Laterality: N/A;   LEFT HEART CATH AND CORONARY ANGIOGRAPHY N/A 09/01/2021   Procedure: LEFT HEART CATH AND CORONARY ANGIOGRAPHY;  Surgeon: Burnard Debby LABOR, MD;  [facility] INVAS[facility] CV LAB;  Service: Cardiovascular;  Laterality: N/A;   LEFT HEART CATHETERIZATION WITH CORONARY ANGIOGRAM N/A 10/17/2012   Procedure: LEFT HEART CATHETERIZATION WITH CORONARY ANGIOGRAM;  Surgeon: Maude JAYSON Emmer, MD;  Location: [facility] CATH LAB;  Service: Cardiovascular;  Laterality: N/A;   LEFT HEART CATHETERIZATION WITH CORONARY ANGIOGRAM N/A 12/17/2012   Procedure: LEFT HEART CATHETERIZATION WITH CORONARY ANGIOGRAM;  Surgeon: Peter M Jordan, MD;  Location: [facility] CATH LAB;  Service: Cardiovascular;  Laterality: N/A;   LEFT HEART CATHETERIZATION WITH CORONARY ANGIOGRAM N/A 06/11/2013   Procedure: LEFT HEART CATHETERIZATION WITH CORONARY ANGIOGRAM;  Surgeon: Deatrice LABOR Cage, MD;  Location: [facility] CATH LAB;  Service: Cardiovascular;  Laterality: N/A;   PERCUTANEOUS CORONARY  STENT INTERVENTION (PCI-S)  10/17/2012   Procedure: PERCUTANEOUS CORONARY STENT INTERVENTION (PCI-S);  Surgeon: Maude JAYSON Emmer, MD;  Location: [facility] CATH LAB;  Service: Cardiovascular;;   PERMANENT PACEMAKER INSERTION N/A 12/17/2012   Procedure: PERMANENT PACEMAKER INSERTION;  Surgeon: Lynwood Rakers, MD;  Location: [facility] CATH LAB;  Service: Cardiovascular;  Laterality: N/A;   POLYPECTOMY  2017   TA   UPPER GASTROINTESTINAL ENDOSCOPY      MEDICATIONS:  acetaminophen  (TYLENOL ) 500 MG tablet   albuterol  (VENTOLIN  HFA) 108 (90 Base) MCG/ACT inhaler   ALPRAZolam  (XANAX ) 0.5 MG tablet   amLODipine  (NORVASC ) 10 MG tablet   aspirin  EC 81 MG tablet   Empagliflozin-metFORMIN  HCl ER (SYNJARDY XR) 12.08-998 MG TB24   esomeprazole  (NEXIUM ) 20 MG capsule   HYDROmorphone  (DILAUDID ) 4 MG tablet   isosorbide  mononitrate (IMDUR ) 120 MG 24 hr tablet   losartan  (COZAAR ) 100 MG tablet   nitroGLYCERIN  (NITROSTAT ) 0.4 MG SL tablet   ondansetron  (ZOFRAN ) 8 MG tablet   propranolol  ER (INDERAL  LA) 160 MG SR capsule   ranolazine  (RANEXA ) 500 MG 12 hr tablet   rosuvastatin  (CRESTOR ) 10 MG tablet   tamsulosin  (FLOMAX ) 0.4 MG CAPS capsule    sodium chloride  flush (NS) 0.9 % injection 3 mL   Last ASA 04/27/23.  Instructed at PAT to hold Synjarday after 04/28/23 until after surgery.    Isaiah Ruder, PA-C Surgical Short Stay/Anesthesiology [facility] Phone [phone] [facility] Phone [phone] 04/30/2023 11:56 AM

## 2023-05-01 ENCOUNTER — Encounter (HOSPITAL_COMMUNITY)
Admission: RE | Disposition: A | Discharge status: Home / Self Care | Payer: Self-pay | Source: Home / Self Care | Attending: General Surgery

## 2023-05-01 ENCOUNTER — Encounter (HOSPITAL_COMMUNITY): Payer: Self-pay | Admitting: General Surgery

## 2023-05-01 ENCOUNTER — Telehealth: Payer: Self-pay | Admitting: Cardiovascular Disease

## 2023-05-01 ENCOUNTER — Ambulatory Visit (HOSPITAL_COMMUNITY): Payer: Self-pay | Admitting: Vascular Surgery

## 2023-05-01 ENCOUNTER — Ambulatory Visit (HOSPITAL_COMMUNITY)
Admission: RE | Admit: 2023-05-01 | Discharge: 2023-05-01 | Disposition: A | Discharge status: Home / Self Care | Payer: Medicare Other | Attending: General Surgery | Admitting: General Surgery

## 2023-05-01 ENCOUNTER — Other Ambulatory Visit: Payer: Self-pay

## 2023-05-01 ENCOUNTER — Ambulatory Visit (HOSPITAL_COMMUNITY): Payer: Self-pay | Admitting: Anesthesiology

## 2023-05-01 DIAGNOSIS — G709 Myoneural disorder, unspecified: Secondary | ICD-10-CM | POA: Diagnosis not present

## 2023-05-01 DIAGNOSIS — E119 Type 2 diabetes mellitus without complications: Secondary | ICD-10-CM | POA: Diagnosis not present

## 2023-05-01 DIAGNOSIS — Z87891 Personal history of nicotine dependence: Secondary | ICD-10-CM

## 2023-05-01 DIAGNOSIS — Z955 Presence of coronary angioplasty implant and graft: Secondary | ICD-10-CM | POA: Diagnosis not present

## 2023-05-01 DIAGNOSIS — K219 Gastro-esophageal reflux disease without esophagitis: Secondary | ICD-10-CM | POA: Insufficient documentation

## 2023-05-01 DIAGNOSIS — I10 Essential (primary) hypertension: Secondary | ICD-10-CM | POA: Insufficient documentation

## 2023-05-01 DIAGNOSIS — M1990 Unspecified osteoarthritis, unspecified site: Secondary | ICD-10-CM | POA: Insufficient documentation

## 2023-05-01 DIAGNOSIS — G8918 Other acute postprocedural pain: Secondary | ICD-10-CM | POA: Diagnosis not present

## 2023-05-01 DIAGNOSIS — Z8249 Family history of ischemic heart disease and other diseases of the circulatory system: Secondary | ICD-10-CM | POA: Insufficient documentation

## 2023-05-01 DIAGNOSIS — I2510 Atherosclerotic heart disease of native coronary artery without angina pectoris: Secondary | ICD-10-CM | POA: Diagnosis not present

## 2023-05-01 DIAGNOSIS — F419 Anxiety disorder, unspecified: Secondary | ICD-10-CM | POA: Diagnosis not present

## 2023-05-01 DIAGNOSIS — I25119 Atherosclerotic heart disease of native coronary artery with unspecified angina pectoris: Secondary | ICD-10-CM | POA: Diagnosis not present

## 2023-05-01 DIAGNOSIS — Z79899 Other long term (current) drug therapy: Secondary | ICD-10-CM | POA: Diagnosis not present

## 2023-05-01 DIAGNOSIS — I25118 Atherosclerotic heart disease of native coronary artery with other forms of angina pectoris: Secondary | ICD-10-CM

## 2023-05-01 DIAGNOSIS — G4730 Sleep apnea, unspecified: Secondary | ICD-10-CM | POA: Insufficient documentation

## 2023-05-01 DIAGNOSIS — Z01818 Encounter for other preprocedural examination: Secondary | ICD-10-CM

## 2023-05-01 DIAGNOSIS — Z950 Presence of cardiac pacemaker: Secondary | ICD-10-CM | POA: Insufficient documentation

## 2023-05-01 DIAGNOSIS — K4020 Bilateral inguinal hernia, without obstruction or gangrene, not specified as recurrent: Secondary | ICD-10-CM | POA: Diagnosis not present

## 2023-05-01 DIAGNOSIS — Z7984 Long term (current) use of oral hypoglycemic drugs: Secondary | ICD-10-CM | POA: Insufficient documentation

## 2023-05-01 LAB — GLUCOSE, CAPILLARY
Glucose-Capillary: 151 mg/dL — ABNORMAL HIGH (ref 70–99)
Glucose-Capillary: 172 mg/dL — ABNORMAL HIGH (ref 70–99)

## 2023-05-01 SURGERY — REPAIR, HERNIA, INGUINAL, BILATERAL, ROBOT-ASSISTED
Anesthesia: Regional | Site: Abdomen | Laterality: Bilateral

## 2023-05-01 MED ORDER — LIDOCAINE 2% (20 MG/ML) 5 ML SYRINGE
INTRAMUSCULAR | Status: AC
  Filled 2023-05-01: qty 5

## 2023-05-01 MED ORDER — ONDANSETRON HCL 4 MG/2ML IJ SOLN: 4.0000 mg | Freq: Once | INTRAMUSCULAR | Status: DC | PRN

## 2023-05-01 MED ORDER — MIDAZOLAM HCL 2 MG/2ML IJ SOLN
INTRAMUSCULAR | Status: AC
  Filled 2023-05-01: qty 2

## 2023-05-01 MED ORDER — BUPIVACAINE HCL (PF) 0.25 % IJ SOLN
INTRAMUSCULAR | Status: AC
  Filled 2023-05-01: qty 20

## 2023-05-01 MED ORDER — AMISULPRIDE (ANTIEMETIC) 5 MG/2ML IV SOLN
10.0000 mg | Freq: Once | INTRAVENOUS | Status: AC | PRN
  Administered 2023-05-01: 10 mg via INTRAVENOUS

## 2023-05-01 MED ORDER — AMISULPRIDE (ANTIEMETIC) 5 MG/2ML IV SOLN
INTRAVENOUS | Status: AC
  Filled 2023-05-01: qty 4

## 2023-05-01 MED ORDER — KETOROLAC TROMETHAMINE 15 MG/ML IJ SOLN: 15.0000 mg | Freq: Once | INTRAMUSCULAR | Status: DC | PRN

## 2023-05-01 MED ORDER — BUPIVACAINE HCL 0.25 % IJ SOLN
INTRAMUSCULAR | Status: DC | PRN
  Administered 2023-05-01: 13 mL

## 2023-05-01 MED ORDER — CHLORHEXIDINE GLUCONATE CLOTH 2 % EX PADS: 6.0000 | MEDICATED_PAD | Freq: Once | CUTANEOUS | Status: DC

## 2023-05-01 MED ORDER — BUPIVACAINE HCL (PF) 0.25 % IJ SOLN
INTRAMUSCULAR | Status: DC | PRN
  Administered 2023-05-01 (×2): 15 mL via PERINEURAL

## 2023-05-01 MED ORDER — ROCURONIUM BROMIDE 10 MG/ML (PF) SYRINGE
PREFILLED_SYRINGE | INTRAVENOUS | Status: AC
  Filled 2023-05-01: qty 10

## 2023-05-01 MED ORDER — ACETAMINOPHEN 325 MG PO TABS: 650.0000 mg | ORAL_TABLET | Freq: Four times a day (QID) | ORAL | 0 refills | Status: AC

## 2023-05-01 MED ORDER — FENTANYL CITRATE (PF) 100 MCG/2ML IJ SOLN
25.0000 ug | INTRAMUSCULAR | Status: DC | PRN
  Administered 2023-05-01: 50 ug via INTRAVENOUS

## 2023-05-01 MED ORDER — DEXMEDETOMIDINE HCL IN NACL 80 MCG/20ML IV SOLN
INTRAVENOUS | Status: DC | PRN
  Administered 2023-05-01: 8 ug via INTRAVENOUS

## 2023-05-01 MED ORDER — ALBUMIN HUMAN 5 % IV SOLN: INTRAVENOUS | Status: DC | PRN

## 2023-05-01 MED ORDER — ACETAMINOPHEN 650 MG RE SUPP
650.0000 mg | RECTAL | Status: DC | PRN
Start: 2023-05-01 — End: 2023-05-01

## 2023-05-01 MED ORDER — ACETAMINOPHEN 325 MG PO TABS: 650.0000 mg | ORAL_TABLET | ORAL | Status: DC | PRN

## 2023-05-01 MED ORDER — FENTANYL CITRATE (PF) 100 MCG/2ML IJ SOLN: 50.0000 ug | INTRAMUSCULAR | Status: DC | PRN

## 2023-05-01 MED ORDER — LIDOCAINE 2% (20 MG/ML) 5 ML SYRINGE
INTRAMUSCULAR | Status: DC | PRN
  Administered 2023-05-01: 60 mg via INTRAVENOUS

## 2023-05-01 MED ORDER — ACETAMINOPHEN 500 MG PO TABS
1000.0000 mg | ORAL_TABLET | ORAL | Status: AC
  Administered 2023-05-01: 1000 mg via ORAL
  Filled 2023-05-01: qty 2

## 2023-05-01 MED ORDER — ONDANSETRON HCL 4 MG/2ML IJ SOLN
INTRAMUSCULAR | Status: DC | PRN
  Administered 2023-05-01: 4 mg via INTRAVENOUS

## 2023-05-01 MED ORDER — MIDAZOLAM HCL 2 MG/2ML IJ SOLN
INTRAMUSCULAR | Status: DC | PRN
  Administered 2023-05-01: 2 mg via INTRAVENOUS

## 2023-05-01 MED ORDER — ORAL CARE MOUTH RINSE: 15.0000 mL | Freq: Once | OROMUCOSAL | Status: AC

## 2023-05-01 MED ORDER — DEXMEDETOMIDINE HCL IN NACL 80 MCG/20ML IV SOLN
INTRAVENOUS | Status: AC
  Filled 2023-05-01: qty 20

## 2023-05-01 MED ORDER — SODIUM CHLORIDE 0.9% FLUSH: 3.0000 mL | Freq: Two times a day (BID) | INTRAVENOUS | Status: DC

## 2023-05-01 MED ORDER — LACTATED RINGERS IV SOLN
INTRAVENOUS | Status: DC
Start: 2023-05-01 — End: 2023-05-01

## 2023-05-01 MED ORDER — OXYCODONE HCL 5 MG PO TABS: 5.0000 mg | ORAL_TABLET | Freq: Three times a day (TID) | ORAL | 0 refills | Status: AC | PRN

## 2023-05-01 MED ORDER — SODIUM CHLORIDE (PF) 0.9 % IJ SOLN
INTRAMUSCULAR | Status: AC
  Filled 2023-05-01: qty 20

## 2023-05-01 MED ORDER — DEXAMETHASONE SODIUM PHOSPHATE 10 MG/ML IJ SOLN
INTRAMUSCULAR | Status: DC | PRN
  Administered 2023-05-01: 10 mg via INTRAVENOUS

## 2023-05-01 MED ORDER — ROCURONIUM BROMIDE 10 MG/ML (PF) SYRINGE
PREFILLED_SYRINGE | INTRAVENOUS | Status: DC | PRN
  Administered 2023-05-01: 10 mg via INTRAVENOUS
  Administered 2023-05-01: 30 mg via INTRAVENOUS
  Administered 2023-05-01: 50 mg via INTRAVENOUS
  Administered 2023-05-01: 20 mg via INTRAVENOUS

## 2023-05-01 MED ORDER — ONDANSETRON HCL 4 MG/2ML IJ SOLN
INTRAMUSCULAR | Status: AC
  Filled 2023-05-01: qty 2

## 2023-05-01 MED ORDER — BUPIVACAINE HCL (PF) 0.5 % IJ SOLN
INTRAMUSCULAR | Status: DC | PRN
  Administered 2023-05-01 (×2): 15 mL via PERINEURAL

## 2023-05-01 MED ORDER — PHENYLEPHRINE 80 MCG/ML (10ML) SYRINGE FOR IV PUSH (FOR BLOOD PRESSURE SUPPORT)
PREFILLED_SYRINGE | INTRAVENOUS | Status: DC | PRN
  Administered 2023-05-01: 240 ug via INTRAVENOUS
  Administered 2023-05-01: 160 ug via INTRAVENOUS
  Administered 2023-05-01: 240 ug via INTRAVENOUS
  Administered 2023-05-01: 160 ug via INTRAVENOUS

## 2023-05-01 MED ORDER — FENTANYL CITRATE (PF) 250 MCG/5ML IJ SOLN
INTRAMUSCULAR | Status: AC
  Filled 2023-05-01: qty 5

## 2023-05-01 MED ORDER — OXYCODONE HCL 5 MG PO TABS: 5.0000 mg | ORAL_TABLET | ORAL | Status: DC | PRN

## 2023-05-01 MED ORDER — INSULIN ASPART 100 UNIT/ML IJ SOLN
0.0000 [IU] | INTRAMUSCULAR | Status: DC | PRN
  Administered 2023-05-01: 2 [IU] via SUBCUTANEOUS
  Filled 2023-05-01: qty 1

## 2023-05-01 MED ORDER — EPHEDRINE SULFATE-NACL 50-0.9 MG/10ML-% IV SOSY
PREFILLED_SYRINGE | INTRAVENOUS | Status: DC | PRN
  Administered 2023-05-01 (×4): 10 mg via INTRAVENOUS

## 2023-05-01 MED ORDER — PROPOFOL 10 MG/ML IV BOLUS
INTRAVENOUS | Status: AC
Start: 2023-05-01 — End: ?
  Filled 2023-05-01: qty 20

## 2023-05-01 MED ORDER — VASOPRESSIN 20 UNIT/ML IV SOLN
INTRAVENOUS | Status: DC | PRN
  Administered 2023-05-01 (×2): 2 [IU] via INTRAVENOUS
  Administered 2023-05-01: 1 [IU] via INTRAVENOUS
  Administered 2023-05-01: 2 [IU] via INTRAVENOUS
  Administered 2023-05-01: 1 [IU] via INTRAVENOUS
  Administered 2023-05-01 (×2): 2 [IU] via INTRAVENOUS
  Administered 2023-05-01: 1 [IU] via INTRAVENOUS
  Administered 2023-05-01 (×3): 2 [IU] via INTRAVENOUS
  Administered 2023-05-01: 1 [IU] via INTRAVENOUS

## 2023-05-01 MED ORDER — CEFAZOLIN SODIUM-DEXTROSE 2-4 GM/100ML-% IV SOLN
2.0000 g | INTRAVENOUS | Status: AC
  Administered 2023-05-01: 2 g via INTRAVENOUS
  Filled 2023-05-01: qty 100

## 2023-05-01 MED ORDER — PHENYLEPHRINE 80 MCG/ML (10ML) SYRINGE FOR IV PUSH (FOR BLOOD PRESSURE SUPPORT)
PREFILLED_SYRINGE | INTRAVENOUS | Status: AC
  Filled 2023-05-01: qty 10

## 2023-05-01 MED ORDER — SODIUM CHLORIDE 0.9% FLUSH: 3.0000 mL | INTRAVENOUS | Status: DC | PRN

## 2023-05-01 MED ORDER — EPHEDRINE 5 MG/ML INJ
INTRAVENOUS | Status: AC
  Filled 2023-05-01: qty 5

## 2023-05-01 MED ORDER — VASOPRESSIN 20 UNIT/ML IV SOLN
INTRAVENOUS | Status: AC
  Filled 2023-05-01: qty 1

## 2023-05-01 MED ORDER — CHLORHEXIDINE GLUCONATE 0.12 % MT SOLN
15.0000 mL | Freq: Once | OROMUCOSAL | Status: AC
  Administered 2023-05-01: 15 mL via OROMUCOSAL
  Filled 2023-05-01: qty 15

## 2023-05-01 MED ORDER — BUPIVACAINE LIPOSOME 1.3 % IJ SUSP: 20.0000 mL | Freq: Once | INTRAMUSCULAR | Status: DC

## 2023-05-01 MED ORDER — SUGAMMADEX SODIUM 200 MG/2ML IV SOLN
INTRAVENOUS | Status: DC | PRN
  Administered 2023-05-01: 200 mg via INTRAVENOUS

## 2023-05-01 MED ORDER — FENTANYL CITRATE (PF) 100 MCG/2ML IJ SOLN
INTRAMUSCULAR | Status: AC
  Filled 2023-05-01: qty 2

## 2023-05-01 MED ORDER — SODIUM CHLORIDE 0.9 % IV SOLN: 250.0000 mL | INTRAVENOUS | Status: DC | PRN

## 2023-05-01 MED ORDER — 0.9 % SODIUM CHLORIDE (POUR BTL) OPTIME
TOPICAL | Status: DC | PRN
  Administered 2023-05-01: 1000 mL

## 2023-05-01 MED ORDER — PROPOFOL 10 MG/ML IV BOLUS
INTRAVENOUS | Status: DC | PRN
  Administered 2023-05-01: 150 mg via INTRAVENOUS

## 2023-05-01 MED ORDER — DEXAMETHASONE SODIUM PHOSPHATE 10 MG/ML IJ SOLN
INTRAMUSCULAR | Status: AC
  Filled 2023-05-01: qty 1

## 2023-05-01 MED ORDER — FENTANYL CITRATE (PF) 250 MCG/5ML IJ SOLN
INTRAMUSCULAR | Status: DC | PRN
  Administered 2023-05-01: 50 ug via INTRAVENOUS
  Administered 2023-05-01: 100 ug via INTRAVENOUS

## 2023-05-01 MED ORDER — IBUPROFEN 200 MG PO TABS: 600.0000 mg | ORAL_TABLET | Freq: Four times a day (QID) | ORAL | 0 refills | Status: AC

## 2023-05-01 SURGICAL SUPPLY — 50 items
BAG COUNTER SPONGE SURGICOUNT (BAG) IMPLANT
CHLORAPREP W/TINT 26 (MISCELLANEOUS) ×1 IMPLANT
COVER MAYO STAND STRL (DRAPES) ×1 IMPLANT
COVER SURGICAL LIGHT HANDLE (MISCELLANEOUS) ×1 IMPLANT
COVER TIP SHEARS 8 DVNC (MISCELLANEOUS) ×1 IMPLANT
DEFOGGER SCOPE WARMER CLEARIFY (MISCELLANEOUS) IMPLANT
DERMABOND ADVANCED .7 DNX12 (GAUZE/BANDAGES/DRESSINGS) ×1 IMPLANT
DEVICE TROCAR PUNCTURE CLOSURE (ENDOMECHANICALS) IMPLANT
DRAPE ARM DVNC X/XI (DISPOSABLE) ×4 IMPLANT
DRAPE COLUMN DVNC XI (DISPOSABLE) ×1 IMPLANT
DRAPE CV SPLIT W-CLR ANES SCRN (DRAPES) ×1 IMPLANT
DRAPE SURG ORHT 6 SPLT 77X108 (DRAPES) ×1 IMPLANT
DRIVER NDL MEGA SUTCUT DVNCXI (INSTRUMENTS) ×1 IMPLANT
DRIVER NDLE MEGA SUTCUT DVNCXI (INSTRUMENTS) ×1
ELECT REM PT RETURN 9FT ADLT (ELECTROSURGICAL) ×1
ELECTRODE REM PT RTRN 9FT ADLT (ELECTROSURGICAL) ×1 IMPLANT
FORCEPS BPLR 8 MD DVNC XI (FORCEP) IMPLANT
FORCEPS BPLR FENES DVNC XI (FORCEP) ×1 IMPLANT
GLOVE BIO SURGEON STRL SZ7 (GLOVE) ×1 IMPLANT
GOWN STRL REUS W/ TWL LRG LVL3 (GOWN DISPOSABLE) ×2 IMPLANT
GOWN STRL REUS W/ TWL XL LVL3 (GOWN DISPOSABLE) ×2 IMPLANT
GOWN STRL REUS W/TWL 2XL LVL3 (GOWN DISPOSABLE) ×1 IMPLANT
GRASPER SUT TROCAR 14GX15 (MISCELLANEOUS) IMPLANT
KIT BASIN OR (CUSTOM PROCEDURE TRAY) ×1 IMPLANT
KIT TURNOVER KIT B (KITS) ×1 IMPLANT
MARKER SKIN DUAL TIP RULER LAB (MISCELLANEOUS) ×1 IMPLANT
MESH 3DMAX MID 5X7 LT XLRG (Mesh General) IMPLANT
MESH 3DMAX MID 5X7 RT XLRG (Mesh General) IMPLANT
NDL HYPO 22X1.5 SAFETY MO (MISCELLANEOUS) ×1 IMPLANT
NDL INSUFFLATION 14GA 120MM (NEEDLE) ×1 IMPLANT
NEEDLE HYPO 22X1.5 SAFETY MO (MISCELLANEOUS) ×1
NEEDLE INSUFFLATION 14GA 120MM (NEEDLE) ×1
OBTURATOR OPTICAL STND 8 DVNC (TROCAR) ×1
OBTURATOR OPTICALSTD 8 DVNC (TROCAR) ×1 IMPLANT
PAD ARMBOARD 7.5X6 YLW CONV (MISCELLANEOUS) ×2 IMPLANT
SCISSORS MNPLR CVD DVNC XI (INSTRUMENTS) ×1 IMPLANT
SEAL UNIV 5-12 XI (MISCELLANEOUS) ×3 IMPLANT
SET TUBE SMOKE EVAC HIGH FLOW (TUBING) ×1 IMPLANT
SPIKE FLUID TRANSFER (MISCELLANEOUS) ×1 IMPLANT
STOPCOCK 4 WAY LG BORE MALE ST (IV SETS) ×1 IMPLANT
SUT MNCRL AB 4-0 PS2 18 (SUTURE) ×1 IMPLANT
SUT STRATA PDS 0 15 CT-1.5 (SUTURE) IMPLANT
SUT STRATA PDS 2-0 23 CT-1 (SUTURE) IMPLANT
SUT VIC AB 3-0 SH 27X BRD (SUTURE) IMPLANT
SUT VIC AB 3-0 SH 27XBRD (SUTURE) ×1 IMPLANT
SUT VLOC 180 2-0 9IN GS21 (SUTURE) IMPLANT
SUT VLOC BARB 180 ABS3/0GR12 (SUTURE)
SUTURE VLOC BRB 180 ABS3/0GR12 (SUTURE) IMPLANT
TOWEL GREEN STERILE FF (TOWEL DISPOSABLE) ×1 IMPLANT
TRAY LAPAROSCOPIC MC (CUSTOM PROCEDURE TRAY) ×1 IMPLANT

## 2023-05-01 NOTE — Telephone Encounter (Signed)
Pt has been released.

## 2023-05-01 NOTE — Anesthesia Procedure Notes (Signed)
 Procedure Name: Intubation Date/Time: 05/01/2023 7:53 AM  Performed by: Nicholaus Ethelene SAILOR, CRNAPre-anesthesia Checklist: Patient identified, Emergency Drugs available, Suction available and Patient being monitored Patient Re-evaluated:Patient Re-evaluated prior to induction Oxygen Delivery Method: Circle System Utilized Preoxygenation: Pre-oxygenation with 100% oxygen Induction Type: IV induction Ventilation: Oral airway inserted - appropriate to patient size and Two handed mask ventilation required Laryngoscope Size: Glidescope and 4 Grade View: Grade I Tube type: Oral Number of attempts: 1 Airway Equipment and Method: Oral airway and Rigid stylet Placement Confirmation: ETT inserted through vocal cords under direct vision, positive ETCO2 and breath sounds checked- equal and bilateral Secured at: 23 cm Tube secured with: Tape Dental Injury: Teeth and Oropharynx as per pre-operative assessment  Comments: Grade 3 view with Mac 4. Grade 1 with Glidescope

## 2023-05-01 NOTE — Anesthesia Procedure Notes (Signed)
 Anesthesia Regional Block: TAP block   Pre-Anesthetic Checklist: , timeout performed,  Correct Patient, Correct Site, Correct Laterality,  Correct Procedure, Correct Position, site marked,  Risks and benefits discussed,  Surgical consent,  Pre-op evaluation,  At surgeon's request and post-op pain management  Laterality: Right  Prep: chloraprep       Needles:  Injection technique: Single-shot  Needle Type: Echogenic Stimulator Needle     Needle Length: 10cm  Needle Gauge: 20     Additional Needles:   Procedures:,,,, ultrasound used (permanent image in chart),,    Narrative:  Start time: 05/01/2023 7:20 AM End time: 05/01/2023 7:30 AM Injection made incrementally with aspirations every 5 mL.  Performed by: Personally  Anesthesiologist: Patrisha Bernardino SQUIBB, MD  Additional Notes: Functioning IV was confirmed and monitors were applied.  A timeout was performed. Sterile prep, hand hygiene and sterile gloves were used. A 20ga Bbraun echogenic stimulator needle was used. Negative aspiration and negative test dose prior to incremental administration of local anesthetic. The patient tolerated the procedure well.  Ultrasound guidance: relevent anatomy identified, needle position confirmed, local anesthetic spread visualized around nerve(s), vascular puncture avoided.  Image printed for medical record.

## 2023-05-01 NOTE — Anesthesia Procedure Notes (Signed)
 Anesthesia Regional Block: TAP block   Pre-Anesthetic Checklist: , timeout performed,  Correct Patient, Correct Site, Correct Laterality,  Correct Procedure, Correct Position, site marked,  Risks and benefits discussed,  Surgical consent,  Pre-op evaluation,  At surgeon's request and post-op pain management  Laterality: Left  Prep: chloraprep       Needles:  Injection technique: Single-shot  Needle Type: Echogenic Stimulator Needle     Needle Length: 10cm  Needle Gauge: 20     Additional Needles:   Procedures:,,,, ultrasound used (permanent image in chart),,    Narrative:  Start time: 05/01/2023 7:10 AM End time: 05/01/2023 7:20 AM Injection made incrementally with aspirations every 5 mL.  Performed by: Personally  Anesthesiologist: Patrisha Bernardino SQUIBB, MD  Additional Notes: Functioning IV was confirmed and monitors were applied.  A timeout was performed. Sterile prep, hand hygiene and sterile gloves were used. A 20ga Bbraun echogenic stimulator needle was used. Negative aspiration and negative test dose prior to incremental administration of local anesthetic. The patient tolerated the procedure well.  Ultrasound guidance: relevent anatomy identified, needle position confirmed, local anesthetic spread visualized around nerve(s), vascular puncture avoided.  Image printed for medical record.

## 2023-05-01 NOTE — Discharge Instructions (Signed)
 Home Care After Hernia Repair   Activity  Limit activity for the first 24 hours, then you may return to normal daily activities. Returning to normal daily activities as soon as you can following surgery will enhance recovery time.  No heavy lifting pushing or pulling, anything heavier than 10 pounds (gallon of milk weighs approx. 8.8 pounds) for 4-6 weeks from surgery date.  Do not mow the lawn, use a vacuum cleaner, or do any other strenuous activities without first consulting your surgical team.  Climb stairs slowly and watch your step.  Walk as often as you feel able to increase strength and endurance.  No driving or operating heavy machinery within 24 hours of taking narcotic pain medication.  Diet  Drink plenty of fluids and eat a light meal on the night of surgery. Some patients may find their appetite is poor for a week or two after surgery. This is a normal result of the stress of surgery-your appetite will return in time.   There are no specific diet restrictions after surgery.  Dressings and Wound Care  Keep your wound or incision site clean and dry.  You may have different types of dressings covering your incisions depending on your operation and your surgeon: o Dermabond/Durabond (skin glue): This will usually remain in place for 10-14 days, then naturally fall off your skin. You may take a shower 24 hrs after surgery, carefully wash, not scrub the incision site with a mild non-scented soap. Pat dry with a soft towel.  Do not pick or peel skin glue off..   You can shower and let the water fall on the dressings above. Do not soak or submerge your incision(s) in a bath tub, hot tub, or swimming pool, until your doctor says it is ok to do so or the incision(s) have completely healed, usually about 2-4 weeks.  Do not use creams, powder, salves or balms on your incision(s).    What to Expect After Surgery   Moderate discomfort controlled with medications  Minimal drainage from  incision  You may feel pain in one or both shoulders. This pain comes from the gas still left in your belly after the surgery, if you had laparoscopic surgery (several small incisions). The pain should ease over several days to a week. Ambulation will help with this pain.   Belly swelling  Feeling fatigue and weak  Constipation after surgery is common. Drink plenty fluids and eat a high fiber diet.  Swelling - In some patients might feel that their hernia has returned after surgery-DO NOT Worry this is normal. Swelling may be due to the development of a seroma. Seroma is fluid that has built up where the hernia was repaired this is a normal result after surgery and it will slowly reabsorb back into your body over the next several weeks.   (MALE PATIENTS ONLY)-esp following inguinal hernia repair, it is expected that your scrotum may be slightly swollen or tender. Along with oral NSAIDS medications you can apply ice packs, wear compression shorts, and/or elevate scrotum using a rolled up towel.  Also, a bladder catheter may have been placed during your surgery.  Because of this, it may hurt to urinate for a couple of days or you may pass some blood clots. These issues are expected and will go away after a few days. Please notify surgeon or report to Emergency Dept. if you are unable to urinate or your symptoms worsen.    Pain Control: Prescribed Non-Narcotic Pain Medication  You will be given three prescriptions.  Two of them will be for prescription strength ibuprofen (i.e. Advil) and prescription strength acetaminophen (i.e. Tylenol).  The vast majority of patients will just need these two medications.  One prescription will be for a 'rescue' prescription of an oral narcotic (oxycodone).  You may fill this if needed.  You will alternate taking the ibuprofen (600mg ) every 6 hours and also the acetaminophen (650mg ) every 6 hours so that you are taking one of those medications every 3 hours.  For  example: o 0800 - take ibuprofen 600mg  o 1100 - take acetaminophen 650mg  o 1400 - take ibuprofen 600mg  o 1700 - take acetaminophen 650mg  o Etc.  Continue taking this alternating pattern of ibuprofen and acetaminophen for 3 days  If you cannot take one or the other of these medications, just take the one you can every 6 hours.  If you are comfortable at night, you don't have to wake up and take a medication.  If you are still uncomfortable after taking either ibuprofen or acetaminophen, try gentle stretching exercise and ice packs (a bag of frozen vegetables works great).  If you are still uncomfortable, you may fill the narcotic prescription of Oxycodone and take as directed.  Once you have completed these prescriptions, your pain level should be low enough to stop taking medications altogether or just use an over the counter medication (ibuprofen or acetaminophen) as needed.   Pain Control: Over the Counter Medications to take as needed:  Colace/Docusate: May be prescribed by your surgeon to prevent constipation caused by the combination of narcotics, effects of anesthesia, and decreased ambulation.  Hold for loose stools or diarrhea. Take 100 mg 1-2 times a day starting tonight.   Fiber: High fiber foods, extra liquids (water 9-13 cups/day) can also assist with constipation. Examples of high fiber foods are fruit, bran. Prune juice and water are also good liquids to drink.  Milk of Magnesia/Miralax:  If constipated despite take the over the counter stool softeners, you may take Milk of Magnesia or Miralax as directed on bottle to assist with constipation.     Pepcid/Famotidine: May be prescribed while taking naproxen (Aleve) or other NSAIDs such as ibuprofen (Motrin/Advil) to prevent stomach upset or Acid-reflux symptoms. Take 1 tablet 1-2 times a day.   **Constipation: The first bowel movement may occur anywhere between 1-5 days after surgery.  As Tiano as you are not nauseated or not having  significant abdominal pain this variation is acceptable.  Narcotic pain medications can cause constipation increasing discomfort; early discontinuation will assist with bowel management.If constipated despite taking stool softeners, you may take Milk of Magnesia or Miralax as directed on the bottle.     **Home medications: You may restart your home medications as directed by your respective Primary Care Physician or Surgeon.  When to notify your Doctor or Healthcare Team:   Sign of Wound Infection   Fever over 100 degrees.  Wound becomes extremely swollen, shows red streaks, warm to the touch, and/or drainage from the incision site or foul-smelling drainage.  Wound edges separate or opens up  Bleeding or bruising   If you have bleeding, apply pressure to the site and hold the pressure firmly for 5 minutes. If the bleeding continues, apply pressure again and call 911. If the bleeding stopped, call your doctor to report it.   Call your doctor or nurse if you have increased bleeding from your site and increased bruising or a lump forms or gets  larger under your skin at the site.  Unrelieved Pain    Call your doctor or nurse if your pain gets worse or is not eased 1 hour after taking your pain medicine, or if it is severe and uncontrolled.  Nausea and Vomiting   Call your doctor or nurse if you have nausea and vomiting that continues more than 24 hours, will not let you keep medicine down and will not let you keep fluids down  Fever, Flu-like symptoms   Fever over 100 degrees and/or chills  Gastrointestinal Bleeding Symptoms    Black tarry bowel movements.  This can be normal after surgery on the stomach, but should resolve in a day or two.    Call 911 if you suddenly have signs of blood loss such as:  Vomiting blood  Fast heart rate  Feeling faint, sweaty, or blacking out  Passing bright red blood from your rectum  Blood Clot Symptoms   Tender, swollen or reddened areas in your calf  muscle or thighs.  Numbness or tingling in your lower leg or calf, or at the top of your leg or groin  Skin on your leg looks pale or blue or feels cold to touch  Chest pain or have trouble breathing, lightheadedness, fast heart rate  Sudden Onset of Symptoms    Call 911 if you suddenly have:  Leg weakness and spasm  Loss of bladder or bowel function  Seizure  Confusion, severe headache, dizziness or feeling unsteady, problems talking, difficulty swallowing, and/or numbness or muscle weakness as these could be signs of a stroke.   Follow up Appointment Your follow up appointment should be scheduled 2-3 weeks after your surgery date.  If you have not previously scheduled for a follow-up visit you can be scheduled by contacting [phone].

## 2023-05-01 NOTE — H&P (Signed)
 [name] May 12, 1955  [id number].    HPI:  68 y/o M w/ a hx of CAD and DM who presents for elective repair of bilateral inguinal hernias.  He reports that he is in his usual state of health and denies any recent changes in medication.  ROS: Review of Systems  Constitutional: Negative.   HENT: Negative.    Eyes: Negative.   Respiratory: Negative.    Cardiovascular: Negative.   Gastrointestinal: Negative.   Genitourinary: Negative.   Musculoskeletal: Negative.   Skin: Negative.   Neurological: Negative.   Endo/Heme/Allergies: Negative.   Psychiatric/Behavioral: Negative.      Family History  Problem Relation Age of Onset   Heart disease Mother    Hypertension Mother        Carotid disease   Prostate cancer Brother 38   Breast cancer Maternal Aunt    Colon cancer Neg Hx    Colon polyps Neg Hx    Rectal cancer Neg Hx    Stomach cancer Neg Hx    Esophageal cancer Neg Hx     Past Medical History:  Diagnosis Date   ADD (attention deficit disorder)    Rx-Adderall   Anginal pain (HCC)    Anxiety    on meds   Aortic atherosclerosis (HCC) 05/27/2020   Seen on x-ray being treated with statin   AV block, 3rd degree (HCC)    Colon polyps 2011   tubular adenoma by excisional biopsy during colonoscopy   Complete heart block (HCC)    a. s/p PPM Placement in 12/2012 with Medtronic CRT-P placement in 03/2014   Complication of anesthesia    woke up fighting   Coronary artery disease    a. s/p PCI of the RCA in 2014 b. 08/2018: patent RCA stent with new mid-LAD 85% stenosis (treated with PCI/DES) and 85% distal OM2 stenosis (med management recommended)   Degenerative disc disease, cervical    Depression    PTSD   DM (diabetes mellitus) (HCC)    on meds   Dyspnea    with exertion   Gastrointestinal bleeding 10/15/2017   GERD (gastroesophageal reflux disease)    Hyperlipidemia    on meds   Hypertension    on meds   Pacemaker    Palpitations 2003   Holter  in 2003: PACs and PVCs; negative stress nuclear test in 2005; right bundle branch block; echo in 2008-mild LVH, otherwise normal; 2008-negative stress nuclear test.   Pneumonia    Prostatitis    prostate calcifications by CT   Seasonal allergies    Sleep apnea    questionable diagnosis    Past Surgical History:  Procedure Laterality Date   ANTERIOR CERVICAL DECOMP/DISCECTOMY FUSION  2002   Biventricular pacemaker upgrade/ system revision  04/02/2014   removal of previous atrial and ventricular leads with placement of a new MDT Consulta CRT-P system at [facility] by Dr Guadlupe   COLONOSCOPY  2017   MS-MAC-suprep(good)-leiomyoma/TA   COLONOSCOPY W/ POLYPECTOMY  2011   tubular adenoma   CORONARY BALLOON ANGIOPLASTY N/A 06/15/2020   Procedure: CORONARY BALLOON ANGIOPLASTY;  Surgeon: Dann Candyce RAMAN, MD;  Location: [facility] INVASIVE CV LAB;  Service: Cardiovascular;  Laterality: N/A;   CORONARY STENT INTERVENTION N/A 08/22/2018   Procedure: CORONARY STENT INTERVENTION;  Surgeon: Burnard Debby LABOR, MD;  Location: [facility] INVASIVE CV LAB;  Service: Cardiovascular;  Laterality: N/A;   CORONARY ULTRASOUND/IVUS N/A 06/15/2020   Procedure: Intravascular Ultrasound/IVUS;  Surgeon: Dann Candyce RAMAN,  MD;  Location: [facility] INVASIVE CV LAB;  Service: Cardiovascular;  Laterality: N/A;   ESOPHAGOGASTRODUODENOSCOPY     Gastritis   INSERT / REPLACE / REMOVE PACEMAKER  12/17/2012   MDT Adapta L implanted by Dr Kelsie for mobitz II second degree AV block   KNEE SURGERY Right    LEFT HEART CATH AND CORONARY ANGIOGRAPHY N/A 08/22/2018   Procedure: LEFT HEART CATH AND CORONARY ANGIOGRAPHY;  Surgeon: Burnard Debby LABOR, MD;  Location: [facility] INVASIVE CV LAB;  Service: Cardiovascular;  Laterality: N/A;   LEFT HEART CATH AND CORONARY ANGIOGRAPHY N/A 06/15/2020   Procedure: LEFT HEART CATH AND CORONARY ANGIOGRAPHY;  Surgeon: Dann Candyce RAMAN, MD;  Location: [facility] INVASIVE CV LAB;  Service: Cardiovascular;   Laterality: N/A;   LEFT HEART CATH AND CORONARY ANGIOGRAPHY N/A 09/01/2021   Procedure: LEFT HEART CATH AND CORONARY ANGIOGRAPHY;  Surgeon: Burnard Debby LABOR, MD;  Location: [facility] INVASIVE CV LAB;  Service: Cardiovascular;  Laterality: N/A;   LEFT HEART CATHETERIZATION WITH CORONARY ANGIOGRAM N/A 10/17/2012   Procedure: LEFT HEART CATHETERIZATION WITH CORONARY ANGIOGRAM;  Surgeon: Maude JAYSON Emmer, MD;  Location: [facility] CATH LAB;  Service: Cardiovascular;  Laterality: N/A;   LEFT HEART CATHETERIZATION WITH CORONARY ANGIOGRAM N/A 12/17/2012   Procedure: LEFT HEART CATHETERIZATION WITH CORONARY ANGIOGRAM;  Surgeon: Peter M Jordan, MD;  Location: [facility] CATH LAB;  Service: Cardiovascular;  Laterality: N/A;   LEFT HEART CATHETERIZATION WITH CORONARY ANGIOGRAM N/A 06/11/2013   Procedure: LEFT HEART CATHETERIZATION WITH CORONARY ANGIOGRAM;  Surgeon: Deatrice LABOR Cage, MD;  Location: [facility] CATH LAB;  Service: Cardiovascular;  Laterality: N/A;   PERCUTANEOUS CORONARY STENT INTERVENTION (PCI-S)  10/17/2012   Procedure: PERCUTANEOUS CORONARY STENT INTERVENTION (PCI-S);  Surgeon: Maude JAYSON Emmer, MD;  Location: [facility] CATH LAB;  Service: Cardiovascular;;   PERMANENT PACEMAKER INSERTION N/A 12/17/2012   Procedure: PERMANENT PACEMAKER INSERTION;  Surgeon: Lynwood Kelsie, MD;  Location: [facility] CATH LAB;  Service: Cardiovascular;  Laterality: N/A;   POLYPECTOMY  2017   TA   UPPER GASTROINTESTINAL ENDOSCOPY      Social History:  reports that he quit smoking about 19 years ago. His smoking use included cigarettes. He started smoking about 56 years ago. He has a 99.9 pack-year smoking history. He has quit using smokeless tobacco. He reports current alcohol use of about 7.0 - 14.0 standard drinks of alcohol per week. He reports that he does not currently use drugs after having used the following drugs: Marijuana.  Allergies:  Allergies  Allergen Reactions   Morphine  And Codeine Itching and Rash    redness   Lasix  [Furosemide ]     Chest  pain and tightness   Latex Rash    Facility-Administered Medications Prior to Admission  Medication Dose Route Frequency Provider Last Rate Last Admin   sodium chloride  flush (NS) 0.9 % injection 3 mL  3 mL Intravenous Q12H Branch, Dorn FALCON, MD       Medications Prior to Admission  Medication Sig Dispense Refill   acetaminophen  (TYLENOL ) 500 MG tablet Take 1,500 mg by mouth daily as needed for moderate pain.     albuterol  (VENTOLIN  HFA) 108 (90 Base) MCG/ACT inhaler TAKE 2 PUFFS BY MOUTH EVERY 6 HOURS AS NEEDED FOR WHEEZE OR SHORTNESS OF BREATH 18 each 1   ALPRAZolam  (XANAX ) 0.5 MG tablet 1/2-1 twice daily as needed home use only use sparingly 6 tablet 0   amLODipine  (NORVASC ) 10 MG tablet Take 10 mg by mouth daily.     aspirin  EC 81 MG tablet  Take 81 mg by mouth daily. Swallow whole.     Empagliflozin-metFORMIN  HCl ER (SYNJARDY XR) 12.08-998 MG TB24 Take 1 tablet by mouth daily.     esomeprazole  (NEXIUM ) 20 MG capsule Take 20 mg by mouth daily.     HYDROmorphone  (DILAUDID ) 4 MG tablet 1/2 to 1 q 6 hours prn severe pain 20 tablet 0   isosorbide  mononitrate (IMDUR ) 120 MG 24 hr tablet TAKE 1 TABLET(120 MG) BY MOUTH DAILY 90 tablet 2   losartan  (COZAAR ) 100 MG tablet TAKE 1 TABLET BY MOUTH EVERY DAY 90 tablet 1   nitroGLYCERIN  (NITROSTAT ) 0.4 MG SL tablet Place 1 tablet (0.4 mg total) under the tongue every 5 (five) minutes x 3 doses as needed for chest pain (if no relief after 3rd dose, proceed to ED for an evaluation). 25 tablet 3   ondansetron  (ZOFRAN ) 8 MG tablet TAKE 1 TABLET (8MG ) BY MOUTH EVERY 8 HOURS AS NEEDED FOR NAUSEA 15 tablet 2   propranolol  ER (INDERAL  LA) 160 MG SR capsule Take 1 capsule (160 mg total) by mouth daily. 90 capsule 3   ranolazine  (RANEXA ) 500 MG 12 hr tablet Take 1 tablet (500 mg total) by mouth 2 (two) times daily. 60 tablet 6   rosuvastatin  (CRESTOR ) 10 MG tablet TAKE 1 TABLET BY MOUTH EVERY DAY 90 tablet 2   tamsulosin  (FLOMAX ) 0.4 MG CAPS capsule TAKE 2  CAPSULES BY MOUTH AT BEDTIME 180 capsule 1    Physical Exam: Blood pressure 120/74, pulse 61, temperature 98.6 F (37 C), temperature source Oral, resp. rate 18, height 5' 11 (1.803 m), weight 97.5 kg, SpO2 95%. Gen: male, NAD Abd: soft, non-distended, non-tender Groin: normal anatomy, b/l bulges  Results for orders placed or performed during the hospital encounter of 05/01/23 (from the past 48 hours)  Glucose, capillary     Status: Abnormal   Collection Time: 05/01/23  6:10 AM  Result Value Ref Range   Glucose-Capillary 151 (H) 70 - 99 mg/dL    Comment: Glucose reference range applies only to samples taken after fasting for at least 8 hours.   No results found.  Assessment/Plan 68 y/o M who presents for bilateral inguinal hernia repairs  - Will proceed to the OR.  We discussed the alternatives and potential risks of surgery, including but not limited to: bleeding, infection, damage to bowel or surrounding structures, chronic pain, mesh complications, recurrent hernia, and need for additional procedures. All questions were addressed and consent was obtained.   - Tentative plan for discharge from PACU  Cordella DELENA Polly Marlis Cheron Surgery 05/01/2023, 7:13 AM Please see Amion for pager number during day hours 7:00am-4:30pm or 7:00am -11:30am on weekends

## 2023-05-01 NOTE — Transfer of Care (Signed)
 Immediate Anesthesia Transfer of Care Note  Patient: [name]  Procedure(s) Performed: XI ROBOTIC ASSISTED BILATERAL INGUINAL HERNIA WITH MESH (Bilateral: Abdomen)  Patient Location: PACU  Anesthesia Type:GA combined with regional for post-op pain  Level of Consciousness: awake  Airway & Oxygen Therapy: Patient Spontanous Breathing and Patient connected to nasal cannula oxygen  Post-op Assessment: Report given to RN and Post -op Vital signs reviewed and stable  Post vital signs: Reviewed and stable  Last Vitals:  Vitals Value Taken Time  BP 119/71 05/01/23 1005  Temp    Pulse 60 05/01/23 1008  Resp 13 05/01/23 1008  SpO2 92 % 05/01/23 1008  Vitals shown include unfiled device data.  Last Pain:  Vitals:   05/01/23 0622  TempSrc:   PainSc: 0-No pain      Patients Stated Pain Goal: 3 (05/01/23 0622)  Complications: No notable events documented.

## 2023-05-01 NOTE — Op Note (Signed)
 [name] ([id number])  Operative Report   Date 05/01/2023  PREOPERATIVE DIAGNOSIS: Bilateral inguinal hernias  POSTOPERATIVE DIAGNOSIS: Bilateral inguinal hernias  PROCEDURE:  Robotic bilateral Inguinal Hernia Repair with Mesh (Bard 3D XL midweight Polypropylene Mesh x 2)   SURGEON: Cordella Idler, MD  ANESTHESIA: General Anesthesia    INTRAOPERATIVE FINDINGS: Large right sided indirect hernia with bowel that was reduced without issue. Left sided indirect hernia containing fat  ESTIMATED BLOOD LOSS: Minimal   COMPLICATIONS: None  SPECIMENS: None  OPERATIVE INDICATIONS: Pt is a 68 y.o. male who presents with bilateral inguinal hernias.  The patient desires definitive repair.  The procedure's risks, benefits, and alternatives were explained to the patient.  Risks, including the risks of bleeding, infection, need for mesh removal, and potential for hernia recurrence, were discussed.  The patient agreed to proceed and signed informed consent in front of a witness.   DESCRIPTION OF PROCEDURE: After preoperatively identifying the patient in holding, the patient was brought to the operating room and placed supine on the operating room table.  Both arms were tucked and padded to avoid potential nerve injury.  Sequential Compression Devices were placed bilaterally.  After induction of general anesthesia, the patient was given the appropriate perioperative antibiotics.  The abdomen was prepped and draped in a typical sterile fashion.  A JACHO approved time out, where the name of the patient, the operation, and the intended site were confirmed. The abdomen was accessed with a Veress needle via the standard drop technique in the LUQ at Palmer's point.  Insufflation was established.  An 8 mm optical port was placed under direct visualization in the left lower quadrant.  A camera was introduced into the abdomen, and a thorough inspection of the abdomen was performed to confirm there was no  additional pathology.  Two additional 8 mm robotic ports were placed under direct visualization at the umbilicus and in the right lower quadrant.  The patient was then placed into 15-degree Trendelenburg position to facilitate examination of the bilateral inguinal spaces.  A thorough inspection of the abdomen was undertaken.  Bilateral indirect inguinal hernias were identified.      The [employer] robot was brought into the field and docked.  An 8 mm 30-degree scope was placed in the mid-abdominal port.  I began on the right side. There was a segment of bowel within the right inguinal hernia that was easily reduced with slight traction without evidence of damage to the bowel.  The peritoneum was taken down 6 cm superior to the hernia from the anterior abdominal wall, and dissection was taken inferiorly towards the hernia.  Medial to the epigastric vessels, the parietal compartment was dissected and completed to visualize the rectus muscle.  This dissection was carried down to the symphysis pubis and obturator foramen.  The retropubic space was dissected to expose at least 2 cm contralateral to the midline.  Cooper's ligament was exposed and cleared at least 2 cm below the ligament to ensure adequate space for the inferior border of the mesh.  Hesselbach's triangle was cleared, assessing for a direct hernia. There was no evidence of a direct hernia. A small amount of femoral fat was reduced as well. Lateral to the epigastric vessels, the dissection was carried out into the visceral compartment, continuing in the true preperitoneal plane.  The indirect hernia sac was carefully reduced and separated from the cord structures with medial retraction and a combination of blunt/sharp dissection and focused cautery.  This dissection continued  until the cord structures were parietalized completely, allowing for continuous visualization of the reflected peritoneum with the line originating 2 cm below Cooper's medially and  across the psoas muscle in the lateral compartment.  The internal ring was interrogated for a cord lipoma.  Any component of cord lipoma was reduced to the retroperitoneum and seated dorsal to the preperitoneal mesh. Having achieved a complete dissection with a critical view of the entire myopectineal orifice, a piece Bard 3D midweight XL Polypropylene Mesh was introduced into the field.  It was centered at the iliopubic tract, with the medial side crossing the midline and the inferior edge positioned 2 cm below Cooper's ligament.  With complete coverage of the myopectineal orifice, the inferior edge of the peritoneum was posterior and inferior to the mesh.  The lateral aspect of the mesh extended 3-5 cm beyond the lateral edge of the psoas.  Cephalad retraction of the peritoneal flap did not cause lifting of the inferior mesh edge or cord structures.  The mesh was fixated using an interrupted 3-0 Vicryl suture placed to the ipsilateral Coopers ligament.    The same series of steps was then repeated on the left. Findings were notable for an indirect inguinal hernia containing large amount of fat.  Another piece of Bard 3D midweight XL mesh was used on this side, with medial overlap with the mesh on the right.  The peritoneal flaps were closed with two running 3-0 Stratafix barbed suture.  Hemostasis was assured in the entirety of the abdomen.  The two lateral ports were removed under direct visualization.  The abdomen was desufflated under direct visualization.  The port sites were closed, local anesthetic was infiltrated, and Dermabond was applied.  After confirming twice that the sponge, needle, and instrument counts were correct, the procedure was terminated, the patient was extubated and transferred to the recovery room in stable condition.    DISPOSITION: Stable to PACU.   Electronically Signed By: Cordella DELENA Idler 05/01/2023 7:19 AM

## 2023-05-01 NOTE — Anesthesia Postprocedure Evaluation (Signed)
 Anesthesia Post Note  Patient: Jayant Kriz Derden  Procedure(s) Performed: XI ROBOTIC ASSISTED BILATERAL INGUINAL HERNIA WITH MESH (Bilateral: Abdomen)     Patient location during evaluation: PACU Anesthesia Type: Regional and General Level of consciousness: awake Pain management: pain level controlled Vital Signs Assessment: post-procedure vital signs reviewed and stable Respiratory status: spontaneous breathing, nonlabored ventilation and respiratory function stable Cardiovascular status: blood pressure returned to baseline and stable Postop Assessment: no apparent nausea or vomiting Anesthetic complications: no   No notable events documented.  Last Vitals:  Vitals:   05/01/23 1045 05/01/23 1100  BP: 133/76 111/77  Pulse: 60 60  Resp: 11 15  Temp:  36.7 C  SpO2: 97% 97%    Last Pain:  Vitals:   05/01/23 1030  TempSrc:   PainSc: 5                  Willam Munford P Dangela How

## 2023-05-01 NOTE — Progress Notes (Signed)
 50 mcg IV fentanyl wasted in stericycle with JJ C., RN after patient discharged from system.

## 2023-05-01 NOTE — Telephone Encounter (Signed)
 Carrie from [place] is calling because the [employer] would like [place] to release the care to the [place] to monitor his device. Please advise.

## 2023-05-10 ENCOUNTER — Other Ambulatory Visit: Payer: Self-pay | Admitting: General Surgery

## 2023-05-10 DIAGNOSIS — K4020 Bilateral inguinal hernia, without obstruction or gangrene, not specified as recurrent: Secondary | ICD-10-CM

## 2023-05-16 ENCOUNTER — Ambulatory Visit
Admission: RE | Admit: 2023-05-16 | Discharge: 2023-05-16 | Disposition: A | Discharge status: Home / Self Care | Payer: No Typology Code available for payment source | Source: Ambulatory Visit | Attending: General Surgery | Admitting: General Surgery

## 2023-05-16 DIAGNOSIS — K4090 Unilateral inguinal hernia, without obstruction or gangrene, not specified as recurrent: Secondary | ICD-10-CM | POA: Diagnosis not present

## 2023-05-16 DIAGNOSIS — K4020 Bilateral inguinal hernia, without obstruction or gangrene, not specified as recurrent: Secondary | ICD-10-CM

## 2023-05-16 MED ORDER — IOPAMIDOL (ISOVUE-300) INJECTION 61%
100.0000 mL | Freq: Once | INTRAVENOUS | Status: AC | PRN
  Administered 2023-05-16: 100 mL via INTRAVENOUS

## 2023-05-24 ENCOUNTER — Telehealth: Payer: Self-pay | Admitting: Cardiology

## 2023-05-24 NOTE — Telephone Encounter (Signed)
   Pre-operative Risk Assessment    Patient Name: [name]  DOB: [birth year] MRN: [id number]{     Request for Surgical Clearance    Procedure:   OPEN RIGHT INGUINAL HERNIA WITH MESH IN THE NEAR FUTURE.   Date of Surgery:  Clearance TBD                                 Surgeon:  CORDELLA IDLER MD  Surgeon's Group or Practice Name:  [facility] Phone number:  [phone] Fax number:  [phone]   Type of Clearance Requested:   - Medical    Type of Anesthesia:  General    Additional requests/questions:    [name]   05/24/2023, 4:17 PM

## 2023-05-25 NOTE — Telephone Encounter (Signed)
 Left message to call back to schedule tele pre op appt.

## 2023-05-25 NOTE — Telephone Encounter (Signed)
   Name: [name]  DOB: 1956/01/12  MRN: [id number]  Primary Cardiologist: Alvan Carrier, MD   Preoperative team, please contact this patient and set up a phone call appointment for further preoperative risk assessment. Please obtain consent and complete medication review. Thank you for your help.  I confirm that guidance regarding antiplatelet and oral anticoagulation therapy has been completed and, if necessary, noted below.  None requested   I also confirmed the patient resides in the state of [place] . As per [facility] Medical Board telemedicine laws, the patient must reside in the state in which the provider is licensed.   [name], NP 05/25/2023, 1:23 PM [facility]

## 2023-05-30 DIAGNOSIS — F4312 Post-traumatic stress disorder, chronic: Secondary | ICD-10-CM | POA: Diagnosis not present

## 2023-06-01 NOTE — Telephone Encounter (Signed)
2nd attempt: Called patient regarding scheduling a pre-op appointment. Left message for patient to call back.

## 2023-06-04 NOTE — Telephone Encounter (Signed)
Third attempt contacting patient to schedule telephone appt for preop clearance no answer left a detailed message to call back. Will update requesting office that we have tried three times getting in contact with patient and no answer

## 2023-06-13 ENCOUNTER — Ambulatory Visit: Payer: No Typology Code available for payment source | Admitting: Family Medicine

## 2023-06-20 ENCOUNTER — Telehealth: Payer: Self-pay | Admitting: Cardiology

## 2023-06-20 NOTE — Telephone Encounter (Signed)
 Called patient to schedule telephone appointment for preop clearance patient stated he was going to go through the [place] I made patient aware requesting office was needing Dr. Dina Rich to clear him from a cardiac standpoint but patient said he was going to call the [place] and if he needed anything from [place] he will call us I asked patient to make requesting office aware patient voiced understanding

## 2023-06-20 NOTE — Telephone Encounter (Signed)
   Pre-operative Risk Assessment    Patient Name: [name]  DOB: [birth year] MRN: [id number]      Request for Surgical Clearance    Procedure:   Open Right Inguinal Hernia surgery with Mesh  Date of Surgery:  Clearance TBD                                 Surgeon:  Melody Haver Surgeon's Group or Practice Name:  [facility] Phone number:  [phone] Fax number:  [phone]    Type of Clearance Requested:   - Medical    Type of Anesthesia:  Not Indicated   Additional requests/questions:  Please fax a copy of clearance  to the surgeon's office. Pt will have to have Written clearance in order to schedule  Signed, Delaine Lame   06/20/2023, 3:06 PM

## 2023-06-20 NOTE — Telephone Encounter (Signed)
   Name: [name]  DOB: 02-21-1956  MRN: [id number]  Primary Cardiologist: Dina Rich, MD   Preoperative team, please contact this patient and set up a phone call appointment for further preoperative risk assessment. Please obtain consent and complete medication review. Thank you for your help.  I confirm that guidance regarding antiplatelet and oral anticoagulation therapy has been completed and, if necessary, noted below.  Regarding ASA therapy, we recommend continuation of ASA throughout the perioperative period.  However, if the surgeon feels that cessation of ASA is required in the perioperative period, it may be stopped 5-7 days prior to surgery with a plan to resume it as soon as felt to be feasible from a surgical standpoint in the post-operative period.   I also confirmed the patient resides in the state of [place]. As per [facility] Medical Board telemedicine laws, the patient must reside in the state in which the provider is licensed.   Denyce Robert, NP 06/20/2023, 3:17 PM [facility]

## 2023-07-02 LAB — HEMOGLOBIN A1C: Hemoglobin A1C: 6.6

## 2023-07-06 ENCOUNTER — Ambulatory Visit: Admitting: Family Medicine

## 2023-07-06 VITALS — BP 137/76 | HR 63 | Temp 98.6°F | Ht 71.0 in | Wt 219.2 lb

## 2023-07-06 DIAGNOSIS — I442 Atrioventricular block, complete: Secondary | ICD-10-CM | POA: Diagnosis not present

## 2023-07-06 DIAGNOSIS — I10 Essential (primary) hypertension: Secondary | ICD-10-CM | POA: Diagnosis not present

## 2023-07-06 DIAGNOSIS — K4090 Unilateral inguinal hernia, without obstruction or gangrene, not specified as recurrent: Secondary | ICD-10-CM

## 2023-07-06 DIAGNOSIS — R350 Frequency of micturition: Secondary | ICD-10-CM

## 2023-07-06 DIAGNOSIS — E118 Type 2 diabetes mellitus with unspecified complications: Secondary | ICD-10-CM

## 2023-07-06 DIAGNOSIS — N401 Enlarged prostate with lower urinary tract symptoms: Secondary | ICD-10-CM

## 2023-07-06 DIAGNOSIS — R110 Nausea: Secondary | ICD-10-CM

## 2023-07-06 DIAGNOSIS — M5430 Sciatica, unspecified side: Secondary | ICD-10-CM

## 2023-07-06 DIAGNOSIS — E878 Other disorders of electrolyte and fluid balance, not elsewhere classified: Secondary | ICD-10-CM

## 2023-07-06 DIAGNOSIS — F324 Major depressive disorder, single episode, in partial remission: Secondary | ICD-10-CM

## 2023-07-06 MED ORDER — TIZANIDINE HCL 4 MG PO TABS: ORAL_TABLET | ORAL | 1 refills | Status: AC

## 2023-07-06 NOTE — Progress Notes (Signed)
   Subjective:    Patient ID: [name], male    DOB: [birth year], 68 y.o.   MRN: [id number]  HPI Pt comes in today today to discuss hernia operation that did not go correctly.  Pt also has complaints of dizziness and nausea worsening over the last month.  Pt would like to discuss a possible stronger muscle relaxer medication for lower back pain. Medications reviewed and correct, no refills needed. No new allergies known. Right inguinal hernia  Primary hypertension  Benign prostatic hyperplasia with urinary frequency  Sciatic nerve pain, unspecified laterality  Disequilibrium syndrome  Nausea    Review of Systems     Objective:   Physical Exam  Lungs are clear heart rate controlled pulse normal extremities no edema Subjective discomfort lower back      Assessment & Plan:  1. Right inguinal hernia (Primary) Patient has had previous surgery.  Had mesh put in bilateral.  Apparently the area did not take the way it should He still has fullness in the right inguinal area on the left side some slight discomfort Had a CT scan which showed fat related inguinal hernia on the right side Patient with ongoing pain and discomfort he is interested in getting a second opinion He is reluctant to do another surgery but he will like to get opinion of a different general surgeon I would recommend [facility] we will work toward getting this set up  2. DM type 2, controlled, with complication (HCC) As for the diabetes under good control A1c through the [place] look good continue his current medicine  3. Primary hypertension Blood pressure under good control continue current measures  4. CHB (complete heart block) (HCC) Has pacemaker Will be getting this replaced later this year   5. Benign prostatic hyperplasia with urinary frequency Patient wonders if prostate health.  Couple stopped taking it   6. Depression, major, single episode, in partial remission  (HCC) Partial remission no need to add additional medicine   7. Sciatic nerve pain, unspecified laterality Gentle stretching recommended Patient request stronger muscle relaxer Zanaflex 4 mg twice daily as needed at home use only  8. Disequilibrium syndrome Will touch base with ENT to see if they would recommend evaluation of the patient.  Slight horizontal nystagmus Ears canals look normal this seems to be triggered by such as standing up or turning looking at different direction or fast objects moving in front is healed at this  9. Nausea Zofran as needed  Follow-up within 4 to 6 months

## 2023-07-10 ENCOUNTER — Encounter: Payer: Self-pay | Admitting: Family Medicine

## 2023-07-11 ENCOUNTER — Other Ambulatory Visit: Payer: Self-pay

## 2023-07-11 DIAGNOSIS — K4090 Unilateral inguinal hernia, without obstruction or gangrene, not specified as recurrent: Secondary | ICD-10-CM

## 2023-07-13 ENCOUNTER — Other Ambulatory Visit: Payer: Self-pay

## 2023-07-13 DIAGNOSIS — R42 Dizziness and giddiness: Secondary | ICD-10-CM

## 2023-07-13 DIAGNOSIS — H8190 Unspecified disorder of vestibular function, unspecified ear: Secondary | ICD-10-CM

## 2023-07-13 NOTE — Telephone Encounter (Signed)
 Nurses Go ahead with consult with ENT Dr. Irene Pap and also neuro physical therapy for vestibular retraining  Diagnosis disequilibrium, vestibular dysfunction  Please let [name] know that we have placed these referrals thank you

## 2023-07-17 ENCOUNTER — Telehealth: Payer: Self-pay | Admitting: Cardiology

## 2023-07-17 ENCOUNTER — Encounter: Payer: Self-pay | Admitting: Cardiology

## 2023-07-17 NOTE — Telephone Encounter (Signed)
 07/17/2023 Unable to reach patient - needs to have a visit or telephone visit with Dr. Jacobo Forest for pre - op clearance thru [facility].  [facility] [phone] and left a message for [facility], CMA regarding an update on the pre -op clearance form that she sent over. Patient has cancelled his appointments with our office stating that the [place] of [name] is taking care of his medical needs.

## 2023-07-22 ENCOUNTER — Other Ambulatory Visit: Payer: Self-pay | Admitting: Family Medicine

## 2023-07-25 ENCOUNTER — Ambulatory Visit: Admitting: Family Medicine

## 2023-07-30 ENCOUNTER — Other Ambulatory Visit: Payer: Self-pay

## 2023-07-30 ENCOUNTER — Ambulatory Visit: Attending: Family Medicine

## 2023-07-30 DIAGNOSIS — R2681 Unsteadiness on feet: Secondary | ICD-10-CM | POA: Insufficient documentation

## 2023-07-30 DIAGNOSIS — H8190 Unspecified disorder of vestibular function, unspecified ear: Secondary | ICD-10-CM | POA: Diagnosis not present

## 2023-07-30 DIAGNOSIS — R42 Dizziness and giddiness: Secondary | ICD-10-CM | POA: Insufficient documentation

## 2023-07-30 NOTE — Therapy (Signed)
 OUTPATIENT PHYSICAL THERAPY VESTIBULAR EVALUATION     Patient Name: [name] MRN: [id number] DOB:October 16, 1955, 68 y.o., male Today's Date: 07/30/2023  END OF SESSION:  PT End of Session - 07/30/23 1532     Visit Number 1    Number of Visits 4    Date for PT Re-Evaluation 08/27/23    Authorization Type BCBS Medicare    PT Start Time 1530    PT Stop Time 1625    PT Time Calculation (min) 55 min             Past Medical History:  Diagnosis Date   ADD (attention deficit disorder)    Rx-Adderall   Anginal pain (HCC)    Anxiety    on meds   Aortic atherosclerosis (HCC) 05/27/2020   Seen on x-ray being treated with statin   AV block, 3rd degree (HCC)    Colon polyps 2011   tubular adenoma by excisional biopsy during colonoscopy   Complete heart block (HCC)    a. s/p PPM Placement in 12/2012 with Medtronic CRT-P placement in 03/2014   Complication of anesthesia    "woke up fighting"   Coronary artery disease    a. s/p PCI of the RCA in 2014 b. 08/2018: patent RCA stent with new mid-LAD 85% stenosis (treated with PCI/DES) and 85% distal OM2 stenosis (med management recommended)   Degenerative disc disease, cervical    Depression    PTSD   DM (diabetes mellitus) (HCC)    on meds   Dyspnea    with exertion   Gastrointestinal bleeding 10/15/2017   GERD (gastroesophageal reflux disease)    Hyperlipidemia    on meds   Hypertension    on meds   Pacemaker    Palpitations 2003   Holter in 2003: PACs and PVCs; negative stress nuclear test in 2005; right bundle branch block; echo in 2008-mild LVH, otherwise normal; 2008-negative stress nuclear test.   Pneumonia    Prostatitis    prostate calcifications by CT   Seasonal allergies    Sleep apnea    questionable diagnosis   Past Surgical History:  Procedure Laterality Date   ANTERIOR CERVICAL DECOMP/DISCECTOMY FUSION  2002   Biventricular pacemaker upgrade/ system revision  04/02/2014   removal of previous  atrial and ventricular leads with placement of a new MDT Consulta CRT-P system at [facility] by Dr Lorenza Cambridge   COLONOSCOPY  2017   MS-MAC-suprep(good)-leiomyoma/TA   COLONOSCOPY W/ POLYPECTOMY  2011   tubular adenoma   CORONARY BALLOON ANGIOPLASTY N/A 06/15/2020   Procedure: CORONARY BALLOON ANGIOPLASTY;  Surgeon: Corky Crafts, MD;  Location: [facility] INVASIVE CV LAB;  Service: Cardiovascular;  Laterality: N/A;   CORONARY STENT INTERVENTION N/A 08/22/2018   Procedure: CORONARY STENT INTERVENTION;  Surgeon: Lennette Bihari, MD;  Location: [facility] INVASIVE CV LAB;  Service: Cardiovascular;  Laterality: N/A;   CORONARY ULTRASOUND/IVUS N/A 06/15/2020   Procedure: Intravascular Ultrasound/IVUS;  Surgeon: Corky Crafts, MD;  Location: [facility] INVASIVE CV LAB;  Service: Cardiovascular;  Laterality: N/A;   ESOPHAGOGASTRODUODENOSCOPY     Gastritis   INSERT / REPLACE / REMOVE PACEMAKER  12/17/2012   MDT Adapta L implanted by Dr Johney Frame for mobitz II second degree AV block   KNEE SURGERY Right    LEFT HEART CATH AND CORONARY ANGIOGRAPHY N/A 08/22/2018   Procedure: LEFT HEART CATH AND CORONARY ANGIOGRAPHY;  Surgeon: Lennette Bihari, MD;  Location: [facility] INVASIVE CV LAB;  Service: Cardiovascular;  Laterality: N/A;  LEFT HEART CATH AND CORONARY ANGIOGRAPHY N/A 06/15/2020   Procedure: LEFT HEART CATH AND CORONARY ANGIOGRAPHY;  Surgeon: Lucendia Rusk, MD;  Location: [facility] INVASIVE CV LAB;  Service: Cardiovascular;  Laterality: N/A;   LEFT HEART CATH AND CORONARY ANGIOGRAPHY N/A 09/01/2021   Procedure: LEFT HEART CATH AND CORONARY ANGIOGRAPHY;  Surgeon: Millicent Ally, MD;  Location: [facility] INVASIVE CV LAB;  Service: Cardiovascular;  Laterality: N/A;   LEFT HEART CATHETERIZATION WITH CORONARY ANGIOGRAM N/A 10/17/2012   Procedure: LEFT HEART CATHETERIZATION WITH CORONARY ANGIOGRAM;  Surgeon: Loyde Rule, MD;  Location: [facility] CATH LAB;  Service: Cardiovascular;  Laterality: N/A;   LEFT HEART  CATHETERIZATION WITH CORONARY ANGIOGRAM N/A 12/17/2012   Procedure: LEFT HEART CATHETERIZATION WITH CORONARY ANGIOGRAM;  Surgeon: Peter M Swaziland, MD;  Location: [facility] CATH LAB;  Service: Cardiovascular;  Laterality: N/A;   LEFT HEART CATHETERIZATION WITH CORONARY ANGIOGRAM N/A 06/11/2013   Procedure: LEFT HEART CATHETERIZATION WITH CORONARY ANGIOGRAM;  Surgeon: Wenona Hamilton, MD;  Location: [facility] CATH LAB;  Service: Cardiovascular;  Laterality: N/A;   PERCUTANEOUS CORONARY STENT INTERVENTION (PCI-S)  10/17/2012   Procedure: PERCUTANEOUS CORONARY STENT INTERVENTION (PCI-S);  Surgeon: Loyde Rule, MD;  Location: [facility] CATH LAB;  Service: Cardiovascular;;   PERMANENT PACEMAKER INSERTION N/A 12/17/2012   Procedure: PERMANENT PACEMAKER INSERTION;  Surgeon: Jolly Needle, MD;  Location: [facility] CATH LAB;  Service: Cardiovascular;  Laterality: N/A;   POLYPECTOMY  2017   TA   UPPER GASTROINTESTINAL ENDOSCOPY     Patient Active Problem List   Diagnosis Date Noted   Lumbar radiculopathy 12/26/2022   Cervical spondylosis 12/26/2022   Cervical radicular pain 12/26/2022   S/P cervical spinal fusion 12/26/2022   Chronic pain syndrome 12/26/2022   Acute bronchitis 04/17/2022   Upper airway cough syndrome 12/14/2020   Aortic atherosclerosis (HCC) 05/27/2020   Stable angina pectoris (HCC)    Hyperlipidemia 01/04/2016   Acquired complete AV block (HCC) 04/02/2014   OSA (obstructive sleep apnea) 10/16/2013   BPH (benign prostatic hyperplasia) 08/25/2013   Coronary artery disease involving native coronary artery of native heart with angina pectoris (HCC) 10/18/2012   S/P drug eluting coronary stent placement 10/18/2012   DOE (dyspnea on exertion) 12/26/2011   Hypertension    GERD (gastroesophageal reflux disease) 12/20/2011   Hx of adenomatous colonic polyps 12/20/2011    PCP: Bennet Brasil, MD REFERRING PROVIDER: Bennet Brasil, MD  REFERRING DIAG: R42 (ICD-10-CM) - Disequilibrium H81.90 (ICD-10-CM) -  Vestibular dysfunction, unspecified lateralit  THERAPY DIAG:  Dizziness and giddiness  Unsteadiness on feet  Disequilibrium  ONSET DATE: 30 years  Rationale for Evaluation and Treatment: Rehabilitation  SUBJECTIVE:   SUBJECTIVE STATEMENT: Notes hx of motion sensitivity x 30 years and feeling of sea-sickness with inciting events (such as riding in car as passenger, playing video games) and symptoms come on and require stopping activity and closing eyes/resting. Reports strong visual component to onset of symptoms. No HA component noted. Notes fullness in ears   Reports chronic neck pain from hx of ACDF but does not notice any correlation between the neck and his symptoms. Reports photosensitivity with episodes and increased issues with dim lighting.  Notes sharp, fast movements to head/neck can also bring on episodes.  Stress also provokes episodes.  Anti-nausea Rx tends to just make him sleepy and doesn't use often as he finds just laying down/resting reduces symptoms. Nausea is the chief compliant of symptoms.  Pt accompanied by: self  PERTINENT HISTORY: ACDF, pacemaker  PAIN:  Are you having pain? Yes: NPRS scale: 3-4/10 Pain location: legs, back, neck Pain description: ache, sore, constant Aggravating factors:   Relieving factors:    PRECAUTIONS: None  RED FLAGS: None   WEIGHT BEARING RESTRICTIONS: No  FALLS: Has patient fallen in last 6 months? No  LIVING ENVIRONMENT: Lives with: lives with their spouse Lives in: House/apartment Stairs:    Has following equipment at home: None  PLOF: Independent  PATIENT GOALS:   OBJECTIVE:  Note: Objective measures were completed at Evaluation unless otherwise noted.  DIAGNOSTIC FINDINGS:   COGNITION: Overall cognitive status: History of cognitive impairments - at baseline  POSTURE:  No Significant postural limitations  Cervical ROM:    ROM limited all directions from hx of ACDF  STRENGTH:  NT    TRANSFERS: Independent    GAIT: Gait pattern: WFL Distance walked:  Assistive device utilized: None Level of assistance: Complete Independence Comments:     PATIENT SURVEYS:  DHI 36  VESTIBULAR ASSESSMENT:  GENERAL OBSERVATION: wears bifocals   SYMPTOM BEHAVIOR:  Subjective history: 30 year hx of motion sickness/sensitivity  Non-Vestibular symptoms: nausea/vomiting and "feeling of seasick"  Type of dizziness: Imbalance (Disequilibrium) and "Swimmyheaded"  Frequency: daily  Duration: 2-4 hours when activated  Aggravating factors: Induced by motion: turning body quickly, turning head quickly, and sitting in a moving car and Worse outside or in busy environment  Relieving factors: closing eyes and rest  Progression of symptoms: unchanged  OCULOMOTOR EXAM:  Ocular Alignment: normal  Ocular ROM: No Limitations  Spontaneous Nystagmus: absent  Gaze-Induced Nystagmus: absent  Smooth Pursuits: intact, increased nausea w/ horizontal vs vertical  Saccades: slow, increased nausea  Convergence/Divergence: 30 cm, decreased left convergence    VESTIBULAR - OCULAR REFLEX:   Slow VOR: Comment: nauseated with vertical > horizontal  VOR Cancellation: Normal, nausea  Head-Impulse Test: HIT Right: negative HIT Left: negative--unable to perform adequately due to pt PTSD limiting therapist moving head, performed as self-imposed Head Impulse  Dynamic Visual Acuity: Not able to be assessed   POSITIONAL TESTING: NT  MOTION SENSITIVITY:  Motion Sensitivity Quotient Intensity: 0 = none, 1 = Lightheaded, 2 = Mild, 3 = Moderate, 4 = Severe, 5 = Vomiting  Intensity  1. Sitting to supine   2. Supine to L side   3. Supine to R side   4. Supine to sitting   5. L Hallpike-Dix   6. Up from L    7. R Hallpike-Dix   8. Up from R    9. Sitting, head tipped to L knee   10. Head up from L knee   11. Sitting, head tipped to R knee   12. Head up from R knee   13. Sitting head turns  x5   14.Sitting head nods x5   15. In stance, 180 turn to L    16. In stance, 180 turn to R     OTHOSTATICS: not done  FUNCTIONAL GAIT: Functional gait assessment: TBD MCTSIB: Condition 1: Avg of 3 trials: 30 sec, Condition 2: Avg of 3 trials: 30 sec, Condition 3: Avg of 3 trials: 30 sec, Condition 4: Avg of 3 trials: 30 sec, and Total Score: 120/120 Mild sway condition 4  TREATMENT DATE: 07/30/23   PATIENT EDUCATION: Education details: assessment details, HEP initiation, rationale of interventions Person educated: Patient Education method: Explanation and Handouts Education comprehension: needs further education  HOME EXERCISE PROGRAM: Access Code: [email or web address] URL: [email or web address] Date: 07/30/2023 Prepared by: Tedd Favorite  Program Notes search [employer] on YouTube for visual stimulation activities  Exercises - Seated Gaze Stabilization with Head Rotation  - 1 x daily - 7 x weekly - 3-5 sets - 15-30 sec hold - Seated Gaze Stabilization with Head Nod  - 1 x daily - 7 x weekly - 3-5 sets - 15-30 sec hold - Pencil Pushups  - 1 x daily - 7 x weekly - 3 sets - 10 reps - Seated Horizontal Saccades  - 1 x daily - 7 x weekly - 3 sets - 10 reps  GOALS: Goals reviewed with patient? Yes  SHORT TERM GOALS: Target date: same as LTG    LONG TERM GOALS: Target date: 08/27/2023    The patient will be independent with HEP for gaze adaptation, habituation, balance, and general mobility with emphasis on progressions Baseline:  Goal status: INITIAL  2.  Report reduced symptoms/improved quality of life per score 18 on Dizziness Handicap Inventory Baseline: 36 Goal status: INITIAL  3.  Demo score 25/30 Functional Gait Assessment for low fall risk Baseline: TBD Goal status: INITIAL    ASSESSMENT:  CLINICAL IMPRESSION: Patient is a 68  y.o. male who was seen today for physical therapy evaluation and treatment for disequilibrium.  Exhibits symptomatic provocation with oculomotor testing with slow/hypometric saccades horizontal > vertical and symptomatic to motion-induced activities such as VOR and VOR cancellation and endorses long hx of visually-induced symptoms such as busy environments or computer/TV screens.   Limited convergence with deficits more obvious to left eye with limited eye excursion to midline and at 30 cm distance.  Initiated HEP development on this date with strategies to address symptoms and deficits with instructions provided for reference.  Would benefit from additional sessions to further progress program for long term management.     OBJECTIVE IMPAIRMENTS: decreased activity tolerance, decreased balance, dizziness, and impaired vision/preception.   ACTIVITY LIMITATIONS: reach over head and locomotion level  PARTICIPATION LIMITATIONS: driving, shopping, and community activity  PERSONAL FACTORS: Age, Time since onset of injury/illness/exacerbation, and 3+ comorbidities: PMH  are also affecting patient's functional outcome.   REHAB POTENTIAL: Good  CLINICAL DECISION MAKING: Evolving/moderate complexity  EVALUATION COMPLEXITY: Moderate   PLAN:  PT FREQUENCY: 1x/week  PT DURATION: 4 weeks  PLANNED INTERVENTIONS: 97750- Physical Performance Testing, 97110-Therapeutic exercises, 97530- Therapeutic activity, [id number]- Neuromuscular re-education, 97535- Self Care, 54098- Manual therapy, [id number]- Gait training, [phone]- Canalith repositioning, and Vestibular training  PLAN FOR NEXT SESSION: HEP review, instruct in progressions for VOR/motion sensitivity, Brock string for convergence, FGA   4:57 PM, 07/30/23 M. Kelly Josephine Wooldridge, PT, DPT Physical Therapist- [place] Office Number: [phone]

## 2023-07-31 ENCOUNTER — Encounter (INDEPENDENT_AMBULATORY_CARE_PROVIDER_SITE_OTHER): Payer: Self-pay | Admitting: Otolaryngology

## 2023-08-05 ENCOUNTER — Other Ambulatory Visit: Payer: Self-pay | Admitting: Family Medicine

## 2023-08-06 ENCOUNTER — Other Ambulatory Visit: Payer: Self-pay

## 2023-08-06 MED ORDER — AMLODIPINE BESYLATE 10 MG PO TABS: 10.0000 mg | ORAL_TABLET | Freq: Every day | ORAL | 3 refills | Status: AC

## 2023-08-10 ENCOUNTER — Encounter (INDEPENDENT_AMBULATORY_CARE_PROVIDER_SITE_OTHER): Payer: Self-pay | Admitting: Otolaryngology

## 2023-08-13 ENCOUNTER — Ambulatory Visit: Payer: Medicare Other | Admitting: Nurse Practitioner

## 2023-08-14 DIAGNOSIS — K4090 Unilateral inguinal hernia, without obstruction or gangrene, not specified as recurrent: Secondary | ICD-10-CM | POA: Diagnosis not present

## 2023-08-14 DIAGNOSIS — K4091 Unilateral inguinal hernia, without obstruction or gangrene, recurrent: Secondary | ICD-10-CM | POA: Diagnosis not present

## 2023-08-22 ENCOUNTER — Encounter: Payer: Self-pay | Admitting: Family Medicine

## 2023-08-22 ENCOUNTER — Ambulatory Visit (INDEPENDENT_AMBULATORY_CARE_PROVIDER_SITE_OTHER): Payer: Medicare Other | Admitting: Family Medicine

## 2023-08-22 VITALS — BP 104/60 | HR 71 | Temp 97.2°F | Ht 71.0 in | Wt 223.0 lb

## 2023-08-22 DIAGNOSIS — N401 Enlarged prostate with lower urinary tract symptoms: Secondary | ICD-10-CM | POA: Diagnosis not present

## 2023-08-22 DIAGNOSIS — I10 Essential (primary) hypertension: Secondary | ICD-10-CM | POA: Diagnosis not present

## 2023-08-22 DIAGNOSIS — Z125 Encounter for screening for malignant neoplasm of prostate: Secondary | ICD-10-CM

## 2023-08-22 DIAGNOSIS — E118 Type 2 diabetes mellitus with unspecified complications: Secondary | ICD-10-CM

## 2023-08-22 DIAGNOSIS — E1169 Type 2 diabetes mellitus with other specified complication: Secondary | ICD-10-CM

## 2023-08-22 DIAGNOSIS — E785 Hyperlipidemia, unspecified: Secondary | ICD-10-CM

## 2023-08-22 DIAGNOSIS — M5430 Sciatica, unspecified side: Secondary | ICD-10-CM

## 2023-08-22 DIAGNOSIS — R350 Frequency of micturition: Secondary | ICD-10-CM

## 2023-08-22 NOTE — Progress Notes (Signed)
   Subjective:    Patient ID: [name], male    DOB: 12-Jan-1956, 68 y.o.   MRN: [id number]  HPI follow up  Type 2 Diabetes Mellitus  Very nice patient Having a lot of fatigue and tiredness Has to get up multiple times at night because of urinary symptoms Also suffers with PTSD Patient relates he will be getting his medications through the [place] But he will be seeing us  on a regular basis Patient does not think he has sleep apnea Denies sleepiness during the day just tiredness and fatigue Fatigue and weakness   Patient also gets severe pain with sciatica from his back down his leg this is intermittent.  In the past he does not tolerate hydrocodone  or oxycodone .  Only thing that seems to relieve it is Dilaudid  he uses it very sparingly we will send in a small number.  PDMP was checked  Review of Systems     Objective:   Physical Exam  General-in no acute distress Eyes-no discharge Lungs-respiratory rate normal, CTA CV-no murmurs,RRR Extremities skin warm dry no edema Neuro grossly normal Behavior normal, alert  Patient does suffer with some mild depression PTSD he sees a counselor for this defers on medication blood pressure     Assessment & Plan:  1. Benign prostatic hyperplasia with urinary frequency (Primary) More than likely his frequency at night is related to his BPH this is being managed by specialist we will check a PSA - PSA  2. Primary hypertension Blood pressure good control continue current measures - Basic metabolic panel with GFR  3. Hyperlipidemia associated with type 2 diabetes mellitus (HCC) Continue statin check lipid profile - Lipid panel  4. DM type 2, controlled, with complication (HCC) Is on medication through the [place] previous A1c 6.6 Fatigue tiredness more than likely related to the interrupted sleep.  Previous CBC looks good.  Would not recommend testosterone treatments. Follow-up 6 months  Intermittent sciatica down the left leg due to  previous back trouble uses Dilaudid  sparingly small number was sent in PDMP was checked caution drowsiness do not operate vehicle with this do not take with Xanax

## 2023-08-23 ENCOUNTER — Telehealth: Payer: Self-pay | Admitting: Family Medicine

## 2023-08-23 ENCOUNTER — Encounter: Payer: Self-pay | Admitting: Family Medicine

## 2023-08-23 MED ORDER — HYDROMORPHONE HCL 2 MG PO TABS: ORAL_TABLET | ORAL | 0 refills | Status: DC

## 2023-08-23 NOTE — Addendum Note (Signed)
 Addended by: Charlotta Cook A on: 08/23/2023 10:04 AM   Modules accepted: Orders

## 2023-08-23 NOTE — Telephone Encounter (Signed)
 error

## 2023-09-06 ENCOUNTER — Ambulatory Visit: Admitting: Family Medicine

## 2023-11-08 DIAGNOSIS — N401 Enlarged prostate with lower urinary tract symptoms: Secondary | ICD-10-CM | POA: Diagnosis not present

## 2023-11-08 DIAGNOSIS — E1169 Type 2 diabetes mellitus with other specified complication: Secondary | ICD-10-CM | POA: Diagnosis not present

## 2023-11-08 DIAGNOSIS — R350 Frequency of micturition: Secondary | ICD-10-CM | POA: Diagnosis not present

## 2023-11-08 DIAGNOSIS — E785 Hyperlipidemia, unspecified: Secondary | ICD-10-CM | POA: Diagnosis not present

## 2023-11-08 DIAGNOSIS — I10 Essential (primary) hypertension: Secondary | ICD-10-CM | POA: Diagnosis not present

## 2023-11-09 ENCOUNTER — Ambulatory Visit: Payer: Self-pay | Admitting: Family Medicine

## 2023-11-09 LAB — LIPID PANEL
Chol/HDL Ratio: 2 ratio (ref 0.0–5.0)
Cholesterol, Total: 132 mg/dL (ref 100–199)
HDL: 66 mg/dL (ref >39)
LDL Chol Calc (NIH): 46 mg/dL (ref 0–99)
Triglycerides: 111 mg/dL (ref 0–149)
VLDL Cholesterol Cal: 20 mg/dL (ref 5–40)

## 2023-11-09 LAB — BASIC METABOLIC PANEL WITH GFR
BUN/Creatinine Ratio: 16 (ref 10–24)
BUN: 18 mg/dL (ref 8–27)
CO2: 23 mmol/L (ref 20–29)
Calcium: 10.1 mg/dL (ref 8.6–10.2)
Chloride: 102 mmol/L (ref 96–106)
Creatinine, Ser: 1.11 mg/dL (ref 0.76–1.27)
Glucose: 155 mg/dL — ABNORMAL HIGH (ref 70–99)
Potassium: 4.8 mmol/L (ref 3.5–5.2)
Sodium: 141 mmol/L (ref 134–144)
eGFR: 72 mL/min/1.73 (ref >59)

## 2023-11-09 LAB — PSA: Prostate Specific Ag, Serum: 2.2 ng/mL (ref 0.0–4.0)

## 2023-11-21 ENCOUNTER — Other Ambulatory Visit: Payer: Self-pay | Admitting: Family Medicine

## 2023-12-05 ENCOUNTER — Telehealth: Payer: Self-pay | Admitting: Family Medicine

## 2023-12-05 ENCOUNTER — Ambulatory Visit: Payer: Self-pay | Admitting: Family Medicine

## 2023-12-05 ENCOUNTER — Ambulatory Visit (INDEPENDENT_AMBULATORY_CARE_PROVIDER_SITE_OTHER): Admitting: Family Medicine

## 2023-12-05 VITALS — BP 135/71 | HR 69 | Temp 97.7°F | Ht 71.0 in | Wt 222.4 lb

## 2023-12-05 DIAGNOSIS — R300 Dysuria: Secondary | ICD-10-CM | POA: Diagnosis not present

## 2023-12-05 DIAGNOSIS — R319 Hematuria, unspecified: Secondary | ICD-10-CM | POA: Diagnosis not present

## 2023-12-05 DIAGNOSIS — R0609 Other forms of dyspnea: Secondary | ICD-10-CM | POA: Diagnosis not present

## 2023-12-05 DIAGNOSIS — R059 Cough, unspecified: Secondary | ICD-10-CM | POA: Diagnosis not present

## 2023-12-05 DIAGNOSIS — E118 Type 2 diabetes mellitus with unspecified complications: Secondary | ICD-10-CM

## 2023-12-05 LAB — POCT URINALYSIS DIP (CLINITEK)
Bilirubin, UA: NEGATIVE
Blood, UA: NEGATIVE
Glucose, UA: >=1000 mg/dL — AB
Leukocytes, UA: NEGATIVE
Nitrite, UA: NEGATIVE
Spec Grav, UA: 1.015 (ref 1.010–1.025)
Urobilinogen, UA: 0.2 U/dL
pH, UA: 6 (ref 5.0–8.0)

## 2023-12-05 NOTE — Telephone Encounter (Signed)
 Nurses Please go ahead with consultation [facility] Dr. Jerilynn Cleveland Reason DOE, angina

## 2023-12-05 NOTE — Progress Notes (Signed)
   Subjective:    Patient ID: [name], male    DOB: [birth year], 68 y.o.   MRN: [id number]  HPI Patient with hematuria Denies abdominal pain States he does not feel good Low energy level Gets out of breath with activity No chest tightness or pain Has history of coronary artery disease Patient would like to do follow-up regarding cardiology with [facility] Dr. Rollene Patient suffers with PTSD Also has severe fear of cystoscopy    Review of Systems     Objective:   Physical Exam General-in no acute distress Eyes-no discharge Lungs-respiratory rate normal, CTA CV-no murmurs Extremities skin warm dry no edema Neuro grossly normal Behavior normal, alert        Assessment & Plan:   1. Hematuria, unspecified type (Primary) Needs further workup Recommend [facility] Will also work toward getting patient set up for a CT scan Will need cystoscopy Because of his PTSD I would recommend sedation before his cystoscopy Patient relates severe fear of having this test done to the point where he will avoid doing the test unless he is sedated - Ambulatory referral to Urology - POCT URINALYSIS DIP (CLINITEK)  2. DOE (dyspnea on exertion) We will go ahead with pulmonary function test and chest x-ray but I believe patient would benefit from seeing cardiology we will set him up with Dr. Rollene at [facility] patient may well need CTA of coronary arteries - DG Chest 2 View - Pulmonary function test  3. Cough, unspecified type No findings on lung exam but we will go ahead with x-ray and pulmonary function test - DG Chest 2 View - Pulmonary function test  4. DM type 2, controlled, with complication (HCC) [facility] - CBC with Differential - Hemoglobin A1c  5. Dysuria Check lab work - POCT URINALYSIS DIP (CLINITEK)

## 2023-12-06 ENCOUNTER — Other Ambulatory Visit: Payer: Self-pay

## 2023-12-06 DIAGNOSIS — I209 Angina pectoris, unspecified: Secondary | ICD-10-CM

## 2023-12-06 DIAGNOSIS — R0609 Other forms of dyspnea: Secondary | ICD-10-CM

## 2023-12-06 NOTE — Telephone Encounter (Signed)
 Referral placed as indicated in chart

## 2023-12-07 ENCOUNTER — Telehealth: Payer: Self-pay | Admitting: Family Medicine

## 2023-12-07 ENCOUNTER — Other Ambulatory Visit: Payer: Self-pay

## 2023-12-07 DIAGNOSIS — R319 Hematuria, unspecified: Secondary | ICD-10-CM

## 2023-12-07 NOTE — Telephone Encounter (Signed)
 Ct ordered

## 2023-12-07 NOTE — Telephone Encounter (Signed)
 Nurses This patient recently called regarding a couple things  Please move forward with the following  Hematuria workup CAT scan Typically if you put in the order as CT hematuria It will come up on epic [id number]  Reason is gross hematuria ideally I like to have this completed within the next 2 weeks with stat reading results thank you

## 2023-12-12 ENCOUNTER — Ambulatory Visit (HOSPITAL_COMMUNITY)
Admission: RE | Admit: 2023-12-12 | Discharge: 2023-12-12 | Disposition: A | Discharge status: Home / Self Care | Source: Ambulatory Visit | Attending: Family Medicine | Admitting: Family Medicine

## 2023-12-12 DIAGNOSIS — R319 Hematuria, unspecified: Secondary | ICD-10-CM | POA: Insufficient documentation

## 2023-12-12 DIAGNOSIS — K4090 Unilateral inguinal hernia, without obstruction or gangrene, not specified as recurrent: Secondary | ICD-10-CM | POA: Diagnosis not present

## 2023-12-12 DIAGNOSIS — K5730 Diverticulosis of large intestine without perforation or abscess without bleeding: Secondary | ICD-10-CM | POA: Diagnosis not present

## 2023-12-12 MED ORDER — IOHEXOL 300 MG/ML  SOLN
125.0000 mL | Freq: Once | INTRAMUSCULAR | Status: AC | PRN
  Administered 2023-12-12: 125 mL via INTRAVENOUS

## 2023-12-13 ENCOUNTER — Telehealth: Payer: Self-pay | Admitting: Family Medicine

## 2023-12-13 ENCOUNTER — Ambulatory Visit: Payer: Self-pay | Admitting: Family Medicine

## 2023-12-13 NOTE — Telephone Encounter (Signed)
 Call patient Tuesday with his concerns regarding CAT scan lab work and referral

## 2023-12-23 ENCOUNTER — Telehealth: Payer: Self-pay | Admitting: Family Medicine

## 2023-12-23 DIAGNOSIS — R0609 Other forms of dyspnea: Secondary | ICD-10-CM

## 2023-12-23 NOTE — Telephone Encounter (Signed)
 Nurses Recently I saw the patient for DOE and hematuria As for the DOE we need to put in a referral to Dr. Jerilynn Cleveland cardiology [facility]-I have connected with the cardiology office they should connect with patient to schedule him  As for the hematuria he already has a referral put in for [facility] my request-make sure referral coordinator knows that I did communicate with Dr. Sherrilee and it would be fine to set him up with Dr. Sherrilee for hematuria

## 2023-12-23 NOTE — Telephone Encounter (Signed)
 I have reached out to [name] in the cardiology office at [facility] they will work toward getting him in  I have reached out to Dr. Sherrilee they stated that they would be willing to do cystoscope with sedation because patient has severe PTSD Both referrals are in the process  I have also sent communication to [facility] for him to do his chest x-ray so that when that comes back we can move forward with doing a CT scan of his chest because he has significant DOE that could be pulmonary related he already has pulmonary function ordered and CT of the abdomen for the hematuria showed severe scarring in the lower bases

## 2023-12-24 ENCOUNTER — Telehealth: Payer: Self-pay | Admitting: Family Medicine

## 2023-12-24 NOTE — Telephone Encounter (Signed)
 Staff-please send message to referral team that patient would prefer to see Dr. Sherrilee for the [facility] will need cystoscopy Dr. Sherrilee stated that he would be willing to do sedation because patient has severe PTSD thank you

## 2023-12-24 NOTE — Telephone Encounter (Signed)
 Referral placed to Dr Rollene.

## 2023-12-25 ENCOUNTER — Ambulatory Visit (HOSPITAL_COMMUNITY)
Admission: RE | Admit: 2023-12-25 | Discharge: 2023-12-25 | Disposition: A | Discharge status: Home / Self Care | Source: Ambulatory Visit | Attending: Family Medicine | Admitting: Family Medicine

## 2023-12-25 DIAGNOSIS — R059 Cough, unspecified: Secondary | ICD-10-CM | POA: Diagnosis not present

## 2023-12-25 DIAGNOSIS — Z981 Arthrodesis status: Secondary | ICD-10-CM | POA: Diagnosis not present

## 2023-12-25 DIAGNOSIS — R0609 Other forms of dyspnea: Secondary | ICD-10-CM | POA: Diagnosis not present

## 2023-12-25 DIAGNOSIS — R0602 Shortness of breath: Secondary | ICD-10-CM | POA: Diagnosis not present

## 2023-12-25 DIAGNOSIS — J984 Other disorders of lung: Secondary | ICD-10-CM | POA: Diagnosis not present

## 2023-12-27 ENCOUNTER — Telehealth: Payer: Self-pay | Admitting: Family Medicine

## 2023-12-27 ENCOUNTER — Other Ambulatory Visit: Payer: Self-pay

## 2023-12-27 DIAGNOSIS — R0609 Other forms of dyspnea: Secondary | ICD-10-CM

## 2023-12-27 DIAGNOSIS — J984 Other disorders of lung: Secondary | ICD-10-CM

## 2023-12-27 NOTE — Telephone Encounter (Signed)
 Nurses Patient had a recent CT scan of the abdomen which showed severe scarring of the lower lungs He has significant dyspnea on exertion and a history of smoking He has already been seen by cardiology for workup Please set up for CT scan of the chest without contrast due to DOE and lung scarring seen on CAT scan  Plain chest x-ray did not show severe scarring but a plain chest x-ray is not sensitive for picking up small findings which could trigger significant DOE

## 2024-01-08 ENCOUNTER — Other Ambulatory Visit: Payer: Self-pay

## 2024-01-08 ENCOUNTER — Ambulatory Visit (HOSPITAL_COMMUNITY)
Admission: RE | Admit: 2024-01-08 | Discharge: 2024-01-08 | Disposition: A | Discharge status: Home / Self Care | Source: Ambulatory Visit | Attending: Family Medicine | Admitting: Family Medicine

## 2024-01-08 DIAGNOSIS — J986 Disorders of diaphragm: Secondary | ICD-10-CM | POA: Diagnosis not present

## 2024-01-08 DIAGNOSIS — I2510 Atherosclerotic heart disease of native coronary artery without angina pectoris: Secondary | ICD-10-CM | POA: Diagnosis not present

## 2024-01-08 DIAGNOSIS — E118 Type 2 diabetes mellitus with unspecified complications: Secondary | ICD-10-CM

## 2024-01-08 DIAGNOSIS — Z79899 Other long term (current) drug therapy: Secondary | ICD-10-CM

## 2024-01-08 DIAGNOSIS — R0609 Other forms of dyspnea: Secondary | ICD-10-CM | POA: Insufficient documentation

## 2024-01-08 DIAGNOSIS — J984 Other disorders of lung: Secondary | ICD-10-CM | POA: Insufficient documentation

## 2024-01-08 NOTE — Telephone Encounter (Signed)
 Nurses Please redo his lab orders Please do the following CBC, metabolic 7, A1c Diagnosis high risk med, diabetes  Once these are completed please send the patient a MyChart message letting him know this was completed thank you-Dr. Glendia

## 2024-01-10 ENCOUNTER — Ambulatory Visit: Payer: Self-pay | Admitting: Family Medicine

## 2024-01-10 LAB — BASIC METABOLIC PANEL WITH GFR
BUN/Creatinine Ratio: 19 (ref 10–24)
BUN: 19 mg/dL (ref 8–27)
CO2: 22 mmol/L (ref 20–29)
Calcium: 9.9 mg/dL (ref 8.6–10.2)
Chloride: 102 mmol/L (ref 96–106)
Creatinine, Ser: 1.01 mg/dL (ref 0.76–1.27)
Glucose: 169 mg/dL — ABNORMAL HIGH (ref 70–99)
Potassium: 4.5 mmol/L (ref 3.5–5.2)
Sodium: 140 mmol/L (ref 134–144)
eGFR: 81 mL/min/1.73 (ref >59)

## 2024-01-10 LAB — CBC WITH DIFFERENTIAL/PLATELET
Basophils Absolute: 0 x10E3/uL (ref 0.0–0.2)
Basos: 1 %
EOS (ABSOLUTE): 0.2 x10E3/uL (ref 0.0–0.4)
Eos: 3 %
Hematocrit: 47 % (ref 37.5–51.0)
Hemoglobin: 15.1 g/dL (ref 13.0–17.7)
Immature Grans (Abs): 0 x10E3/uL (ref 0.0–0.1)
Immature Granulocytes: 0 %
Lymphocytes Absolute: 1.4 x10E3/uL (ref 0.7–3.1)
Lymphs: 23 %
MCH: 30.1 pg (ref 26.6–33.0)
MCHC: 32.1 g/dL (ref 31.5–35.7)
MCV: 94 fL (ref 79–97)
Monocytes Absolute: 0.4 x10E3/uL (ref 0.1–0.9)
Monocytes: 7 %
Neutrophils Absolute: 4 x10E3/uL (ref 1.4–7.0)
Neutrophils: 66 %
Platelets: 174 x10E3/uL (ref 150–450)
RBC: 5.02 x10E6/uL (ref 4.14–5.80)
RDW: 12.8 % (ref 11.6–15.4)
WBC: 6 x10E3/uL (ref 3.4–10.8)

## 2024-01-10 LAB — HEMOGLOBIN A1C
Est. average glucose Bld gHb Est-mCnc: 154 mg/dL
Hgb A1c MFr Bld: 7 % — ABNORMAL HIGH (ref 4.8–5.6)

## 2024-01-15 ENCOUNTER — Ambulatory Visit: Payer: Self-pay | Admitting: Family Medicine

## 2024-01-29 ENCOUNTER — Telehealth: Payer: Self-pay | Admitting: Family Medicine

## 2024-01-29 ENCOUNTER — Other Ambulatory Visit: Payer: Self-pay | Admitting: Family Medicine

## 2024-01-29 ENCOUNTER — Encounter: Payer: Self-pay | Admitting: Family Medicine

## 2024-01-29 DIAGNOSIS — R0609 Other forms of dyspnea: Secondary | ICD-10-CM

## 2024-01-29 DIAGNOSIS — J984 Other disorders of lung: Secondary | ICD-10-CM

## 2024-01-29 MED ORDER — ALPRAZOLAM 0.5 MG PO TABS: ORAL_TABLET | ORAL | 1 refills | Status: AC

## 2024-01-29 NOTE — Telephone Encounter (Signed)
 Patient was previously referred to pulmonary-patient states he has not heard anything-please put in referral to pulmonary for DOE and lung scarring noted on the x-ray

## 2024-01-30 ENCOUNTER — Other Ambulatory Visit: Payer: Self-pay | Admitting: Family Medicine

## 2024-01-31 DIAGNOSIS — F4312 Post-traumatic stress disorder, chronic: Secondary | ICD-10-CM | POA: Diagnosis not present

## 2024-01-31 NOTE — Telephone Encounter (Signed)
 He is due pulmonary referral due to lung scarring and DOE

## 2024-01-31 NOTE — Telephone Encounter (Signed)
Message sent to referral coordinator

## 2024-01-31 NOTE — Telephone Encounter (Signed)
 Per referral coordinator there was not referral ordered for pulmonology- just Cardiology with these dx- please advise

## 2024-02-07 NOTE — Telephone Encounter (Signed)
Referral placed in EPIC 

## 2024-02-14 ENCOUNTER — Ambulatory Visit: Admitting: Internal Medicine

## 2024-02-14 ENCOUNTER — Encounter: Payer: Self-pay | Admitting: Internal Medicine

## 2024-02-14 VITALS — BP 118/66 | HR 69 | Ht 71.0 in | Wt 226.8 lb

## 2024-02-14 DIAGNOSIS — R0989 Other specified symptoms and signs involving the circulatory and respiratory systems: Secondary | ICD-10-CM

## 2024-02-14 DIAGNOSIS — Z87891 Personal history of nicotine dependence: Secondary | ICD-10-CM | POA: Diagnosis not present

## 2024-02-14 DIAGNOSIS — R053 Chronic cough: Secondary | ICD-10-CM | POA: Diagnosis not present

## 2024-02-14 DIAGNOSIS — R062 Wheezing: Secondary | ICD-10-CM

## 2024-02-14 DIAGNOSIS — Z87898 Personal history of other specified conditions: Secondary | ICD-10-CM

## 2024-02-14 DIAGNOSIS — R0609 Other forms of dyspnea: Secondary | ICD-10-CM

## 2024-02-14 DIAGNOSIS — Z950 Presence of cardiac pacemaker: Secondary | ICD-10-CM

## 2024-02-14 DIAGNOSIS — R42 Dizziness and giddiness: Secondary | ICD-10-CM | POA: Diagnosis not present

## 2024-02-14 LAB — CBC WITH DIFFERENTIAL/PLATELET
Basophils Absolute: 0 K/uL (ref 0.0–0.1)
Basophils Relative: 0.6 % (ref 0.0–3.0)
Eosinophils Absolute: 0.2 K/uL (ref 0.0–0.7)
Eosinophils Relative: 2.6 % (ref 0.0–5.0)
HCT: 46.4 % (ref 39.0–52.0)
Hemoglobin: 15.4 g/dL (ref 13.0–17.0)
Lymphocytes Relative: 24.1 % (ref 12.0–46.0)
Lymphs Abs: 1.6 K/uL (ref 0.7–4.0)
MCHC: 33.3 g/dL (ref 30.0–36.0)
MCV: 88.9 fl (ref 78.0–100.0)
Monocytes Absolute: 0.6 K/uL (ref 0.1–1.0)
Monocytes Relative: 8.5 % (ref 3.0–12.0)
Neutro Abs: 4.2 K/uL (ref 1.4–7.7)
Neutrophils Relative %: 64.2 % (ref 43.0–77.0)
Platelets: 197 K/uL (ref 150.0–400.0)
RBC: 5.21 Mil/uL (ref 4.22–5.81)
RDW: 13.9 % (ref 11.5–15.5)
WBC: 6.5 K/uL (ref 4.0–10.5)

## 2024-02-14 LAB — NITRIC OXIDE: Nitric Oxide: 58

## 2024-02-14 MED ORDER — BREZTRI AEROSPHERE 160-9-4.8 MCG/ACT IN AERO: 2.0000 | INHALATION_SPRAY | Freq: Two times a day (BID) | RESPIRATORY_TRACT | Status: DC

## 2024-02-14 NOTE — Patient Instructions (Addendum)
 ICD-10-CM   1. DOE (dyspnea on exertion)  R06.09 CBC w/Diff    Perennial allergen profile IgE    Pulmonary function test    2. Chronic cough  R05.3 CBC w/Diff    Perennial allergen profile IgE    Pulmonary function test    3. Wheeze  R06.2 CBC w/Diff    Perennial allergen profile IgE    Pulmonary function test    4. Former smoker  Z87.891 CBC w/Diff    Perennial allergen profile IgE    Pulmonary function test    5. H/O chemical exposure  Z87.898 CBC w/Diff    Perennial allergen profile IgE    Pulmonary function test    6. Cardiac pacemaker  Z95.0     7. Episode of dizziness  R42      Symptoms out of proportion to the abnormality seen on the CT chest.  Differential diagnosis at this point includes diastolic dysfunction, pacemaker issues versus obstructive lung disease/asthma/chronic bronchitis.  Also cough neuropathy could be playing a role.  The high exhaled nitric oxide test suggest asthma is playing a role  Plan - talk to cardiology and tell them we are suspecting asthm and to replace INDERAL  with a more specific Beta blocker  - Do CBC with differential and RAST allergy panel - Do full pulmonary function test - Take samples of Brztri and take them 2 puff 2 times daily on a scheduled basis and see if this helps symptoms - If workup is nondiagnostic or only partial improvement  - we might have to consider pulmonary stress test especially to look for chronotropic incompetence -= consider stopping inderal  non specific beta blocker   Follow-up - 4 weeks with nurse practitioner either in [place] or in [place]

## 2024-02-14 NOTE — Progress Notes (Signed)
 OV 02/14/2024  Subjective:  Patient ID: [name], male , DOB: 01/02/1956 , age 68 y.o. , MRN: [id number] , ADDRESS: [address] [place] [address] PCP Alphonsa Glendia LABOR, MD Patient Care Team: Alphonsa Glendia LABOR, MD as PCP - General (Family Medicine) Alvan, Dorn FALCON, MD as PCP - Cardiology (Cardiology) Mealor, Eulas BRAVO, MD as PCP - Electrophysiology (Cardiology) Alvan Dorn FALCON, MD as Consulting Physician (Cardiology) Marcelino Nurse, MD as Consulting Physician (Pain Medicine) [facility] as Referring Physician (General Practice)  This Provider for this visit: Treatment Team:  Attending Provider: Jude Harden GAILS, MD    02/14/2024 -   Chief Complaint  Patient presents with   Consult    Referred by Dr Alphonsa for eval of possible ILD. He has had DOE for the past 3-4 years. He gets winded walking up a incline or sometimes with just talking. He also c/o non prod cough, sometimes wakes him in the night.      HPI Muaaz Brau Jabs 68 y.o. -       OV 02/14/2024  Subjective:  Patient ID: [name], male , DOB: 07-29-1955 , age 63 y.o. , MRN: [id number] , ADDRESS: [address] [place] [address] PCP Alphonsa Glendia LABOR, MD Patient Care Team: Alphonsa Glendia LABOR, MD as PCP - General (Family Medicine) Alvan, Dorn FALCON, MD as PCP - Cardiology (Cardiology) Mealor, Eulas BRAVO, MD as PCP - Electrophysiology (Cardiology) Alvan Dorn FALCON, MD as Consulting Physician (Cardiology) Marcelino Nurse, MD as Consulting Physician (Pain Medicine) [facility] as Referring Physician (General Practice)  This Provider for this visit: Treatment Team:  Attending Provider: Jude Harden GAILS, MD    02/14/2024 -   Chief Complaint  Patient presents with   Consult    Referred by Dr Alphonsa for eval of possible ILD. He has had DOE for the past 3-4 years. He gets winded walking up a incline or sometimes with just talking. He also c/o non prod cough,  sometimes wakes him in the night.      HPI Jaymar Loeber Snare 68 y.o. -  [name] is a 68 year old male with coronary artery disease and a pacemaker who presents with worsening shortness of breath and a severe cough. He was referred by Dr. Juliet for evaluation of his breathing problems.  He is a [place] veteran.  He was in the [employer] [place] operative [place]  He has PTSD and in the 1978 was hospitalized at [address] by [facility] because of depressive disorder.  In 2021 he did have COVID that was mild.  He gets COVID vaccines.  He is a former smoker.  He has got exposure to methane chemicals.  In 97s and 1990s he did have a bird.  He has a cat right now.  His current house does not have mold.  He has been experiencing breathing problems since 2022, with symptoms worsening over time. Shortness of breath occurs during activities such as bending over, climbing stairs, or lying in bed. These episodes are not constant but are activity-triggered. During these episodes, his blood pressure drops significantly, sometimes to 80/60 mmHg, compared to his usual 128-130/80 mmHg. He has a pacemaker that runs continuously. A recent cardiac catheterization at the [facility] in [place] showed no significant blockages, with the worst being 40% stenosis. He has a history of two stents placed previously.  He has a  severe, gagging cough that has persisted for over a year, sometimes waking him at night. The cough is non-productive and relieved only by holding his nose and blowing to pop his ears. No seasonal allergies, but he has a history of acid reflux. He is unsure of his current medications as his wife manages them, but he mentions taking blood pressure medications including Andral.  He experiences wheezing, particularly when trying to sleep, and frequently clears his throat. No significant sinus drainage and does not wheeze with the cough. A CT scan of his lungs  showed scarring.  His social history includes a past occupation in environmental work involving solicitor, which he associates with PTSD. He is a former smoker and has no current exposure to pet birds or significant mold in his hom   He is on INDERAL  a non specific beta blocker  SYMPTOM SCALE - ILD 02/14/2024  Current weight Exam nitric oxide 58 ppb and abnormal  O2 use ra  Shortness of Breath 0 -> 5 scale with 5 being worst (score 6 If unable to do)  At rest 2  Simple tasks - showers, clothes change, eating, shaving 3  Household (dishes, doing bed, laundry) 3  Shopping x  Walking level at own pace 2  Walking up Stairs 4  Total (30-36) Dyspnea Score 14  How bad is your cough? 4  How bad is your fatigue 4  How bad is appetiee 2  How bad is nausea 3  How bad is vomiting?  1  How bad is diarrhea? 1  How bad is anxiety? 5  How bad is depression 5  Any chronic pain - if so where and how bad x   Lab Results  Component Value Date   NITRICOXIDE 58 02/14/2024     Lab Results  Component Value Date   NITRICOXIDE 58 02/14/2024   This result suggests high (>/= 50) Type 2 (T2) airway inflammation. This supports inhaled corticosteroid responsiveness and potential eligibility for biologic therapies targeting Type 2 inflammation.       SIT STAND TEST - goal 15 times   02/14/2024    O2 used ra   PRobe - finter or forehead  finger   Number sit and stand completed - goal 15 15   Time taken to complete 49 sec   Resting Pulse Ox/HR/Dyspnea  94% and 69/min and dyspnea of 2/10    Peak measures 95 % and 74/min and dyspnea of 4/10   Final Pulse Ox/HR 95% and 51/min and dyspnea of 2 6 heart rate went down/10   Desaturated </= 88% no   Desaturated <= 3% points no   Got Tachycardic >/= 90/min No but got bradycardic   Miscellaneous comments no him to the      RADIOLOGY Chest CT: Scarring in lungs, likely healing from previous pneumonia, no evidence of  pulmonary fibrosis. (August 2025)  DIAGNOSTIC Cardiac catheterization: Normal coronary arteries, worst stenosis 40%. (02/12/2024)   CT scan from September 2025 personally visualized: He has some mild atelectasis and postinflammatory scarring..  The same findings were present in 2019 and I do not think it is changed.  I personally visualized the scans.   Normal renal function September 2025 Normal hemoglobin September 2025 PFT     Latest Ref Rng & Units 02/07/2021    1:56 PM 11/28/2016   12:05 PM  PFT Results  FVC-Pre L 2.40  3.29   FVC-Predicted Pre % 48  65   FVC-Post L  2.66  3.38   FVC-Predicted Post % 54  66   Pre FEV1/FVC % % 83  81   Post FEV1/FCV % % 82  80   FEV1-Pre L 1.99  2.66   FEV1-Predicted Pre % 54  70   FEV1-Post L 2.17  2.70   DLCO uncorrected ml/min/mmHg 27.62  24.01   DLCO UNC% % 97  68   DLCO corrected ml/min/mmHg 27.62  24.01   DLCO COR %Predicted % 97  68   DLVA Predicted % 131  100   TLC L 5.39  5.44   TLC % Predicted % 72  73   RV % Predicted % 99  96        LAB RESULTS last 96 hours No results found.       has a past medical history of ADD (attention deficit disorder), Allergy, Anginal pain, Anxiety, Aortic atherosclerosis (05/27/2020), Arthritis, AV block, 3rd degree (HCC), Colon polyps (2011), Complete heart block (HCC), Complication of anesthesia, Coronary artery disease, Degenerative disc disease, cervical, Depression, DM (diabetes mellitus) (HCC), Dyspnea, Gastrointestinal bleeding (10/15/2017), GERD (gastroesophageal reflux disease), Hyperlipidemia, Hypertension, Pacemaker, Palpitations (2003), Pneumonia, Prostatitis, Seasonal allergies, and Sleep apnea.   reports that he quit smoking about 20 years ago. His smoking use included cigarettes. He started smoking about 56 years ago. He has a 99.9 pack-year smoking history. He has quit using smokeless tobacco.  Past Surgical History:  Procedure Laterality Date   ANTERIOR CERVICAL  DECOMP/DISCECTOMY FUSION  2002   Biventricular pacemaker upgrade/ system revision  04/02/2014   removal of previous atrial and ventricular leads with placement of a new MDT Consulta CRT-P system at [facility] by Dr [facility]   COLONOSCOPY  2017   MS-MAC-suprep(good)-leiomyoma/TA   COLONOSCOPY W/ POLYPECTOMY  2011   tubular adenoma   CORONARY BALLOON ANGIOPLASTY N/A 06/15/2020   Procedure: CORONARY BALLOON ANGIOPLASTY;  Surgeon: Dann Candyce RAMAN, MD;  Location: [facility] INVASIVE CV LAB;  Service: Cardiovascular;  Laterality: N/A;   CORONARY STENT INTERVENTION N/A 08/22/2018   Procedure: CORONARY STENT INTERVENTION;  Surgeon: Burnard Debby LABOR, MD;  Location: [facility] INVASIVE CV LAB;  Service: Cardiovascular;  Laterality: N/A;   CORONARY ULTRASOUND/IVUS N/A 06/15/2020   Procedure: Intravascular Ultrasound/IVUS;  Surgeon: Dann Candyce RAMAN, MD;  Location: [facility] INVASIVE CV LAB;  Service: Cardiovascular;  Laterality: N/A;   ESOPHAGOGASTRODUODENOSCOPY     Gastritis   INSERT / REPLACE / REMOVE PACEMAKER  12/17/2012   MDT Adapta L implanted by Dr Kelsie for mobitz II second degree AV block   KNEE SURGERY Right    LEFT HEART CATH AND CORONARY ANGIOGRAPHY N/A 08/22/2018   Procedure: LEFT HEART CATH AND CORONARY ANGIOGRAPHY;  Surgeon: Burnard Debby LABOR, MD;  Location: [facility] INVASIVE CV LAB;  Service: Cardiovascular;  Laterality: N/A;   LEFT HEART CATH AND CORONARY ANGIOGRAPHY N/A 06/15/2020   Procedure: LEFT HEART CATH AND CORONARY ANGIOGRAPHY;  Surgeon: Dann Candyce RAMAN, MD;  Location: [facility] INVASIVE CV LAB;  Service: Cardiovascular;  Laterality: N/A;   LEFT HEART CATH AND CORONARY ANGIOGRAPHY N/A 09/01/2021   Procedure: LEFT HEART CATH AND CORONARY ANGIOGRAPHY;  Surgeon: Burnard Debby LABOR, MD;  Location: [facility] INVASIVE CV LAB;  Service: Cardiovascular;  Laterality: N/A;   LEFT HEART CATHETERIZATION WITH CORONARY ANGIOGRAM N/A 10/17/2012   Procedure: LEFT HEART CATHETERIZATION WITH CORONARY ANGIOGRAM;   Surgeon: Maude JAYSON Emmer, MD;  Location: [facility] CATH LAB;  Service: Cardiovascular;  Laterality: N/A;   LEFT HEART CATHETERIZATION WITH CORONARY ANGIOGRAM N/A 12/17/2012  Procedure: LEFT HEART CATHETERIZATION WITH CORONARY ANGIOGRAM;  Surgeon: Peter M Jordan, MD;  Location: [facility] CATH LAB;  Service: Cardiovascular;  Laterality: N/A;   LEFT HEART CATHETERIZATION WITH CORONARY ANGIOGRAM N/A 06/11/2013   Procedure: LEFT HEART CATHETERIZATION WITH CORONARY ANGIOGRAM;  Surgeon: Deatrice DELENA Cage, MD;  Location: [facility] CATH LAB;  Service: Cardiovascular;  Laterality: N/A;   PERCUTANEOUS CORONARY STENT INTERVENTION (PCI-S)  10/17/2012   Procedure: PERCUTANEOUS CORONARY STENT INTERVENTION (PCI-S);  Surgeon: Maude JAYSON Emmer, MD;  Location: [facility] CATH LAB;  Service: Cardiovascular;;   PERMANENT PACEMAKER INSERTION N/A 12/17/2012   Procedure: PERMANENT PACEMAKER INSERTION;  Surgeon: Lynwood Rakers, MD;  Location: [facility] CATH LAB;  Service: Cardiovascular;  Laterality: N/A;   POLYPECTOMY  2017   TA   SPINE SURGERY     UPPER GASTROINTESTINAL ENDOSCOPY      Allergies  Allergen Reactions   Morphine  And Codeine Itching and Rash    redness   Lasix  [Furosemide ]     Chest pain and tightness   Latex Rash    Immunization History  Administered Date(s) Administered   Fluad Quad(high Dose 65+) 01/24/2021   Fluad Trivalent(High Dose 65+) 02/11/2023   Fluzone Influenza virus vaccine,trivalent (IIV3), split virus 03/29/2004   INFLUENZA, HIGH DOSE SEASONAL PF 01/30/2022, 01/29/2024   Influenza Inj Mdck Quad With Preservative 01/30/2018   Influenza Split 02/05/2013   Influenza,inj,Quad PF,6+ Mos 01/20/2014, 01/19/2017   Influenza-Unspecified 05/22/2003, 01/16/2012, 01/15/2018, 02/12/2018, 01/30/2019, 03/26/2020, 02/21/2021, 01/30/2022   Moderna Covid-19 Fall Seasonal Vaccine 71yrs & older 03/06/2022, 01/08/2023   Moderna Covid-19 Vaccine Bivalent Booster 16yrs & up 02/21/2021   Moderna Sars-Covid-2 Vaccination 04/27/2020,  05/09/2020   PFIZER(Purple Top)SARS-COV-2 Vaccination 07/10/2019, 07/24/2019, 08/25/2019   PNEUMOCOCCAL CONJUGATE-20 03/31/2021   Pneumococcal Polysaccharide-23 04/03/2014, 07/12/2020   Respiratory Syncytial Virus Vaccine,Recomb Aduvanted(Arexvy) 03/24/2022   Td 06/15/2008   Tdap 04/07/2017, 03/29/2022   Unspecified SARS-COV-2 Vaccination 01/12/2024   Zoster Recombinant(Shingrix) 10/01/2020, 02/21/2021   Zoster, Live 12/17/2010    Family History  Problem Relation Age of Onset   Heart disease Mother    Hypertension Mother        Carotid disease   Arthritis Mother    Alcohol abuse Father    Prostate cancer Brother 99   Breast cancer Maternal Aunt    Asthma Sister    Colon cancer Neg Hx    Colon polyps Neg Hx    Rectal cancer Neg Hx    Stomach cancer Neg Hx    Esophageal cancer Neg Hx      Current Outpatient Medications:    acetaminophen  (TYLENOL ) 500 MG tablet, Take 1,500 mg by mouth daily as needed for moderate pain., Disp: , Rfl:    albuterol  (VENTOLIN  HFA) 108 (90 Base) MCG/ACT inhaler, TAKE 2 PUFFS BY MOUTH EVERY 6 HOURS AS NEEDED FOR WHEEZE OR SHORTNESS OF BREATH, Disp: 18 each, Rfl: 1   ALPRAZolam  (XANAX ) 0.5 MG tablet, TAKE 1/2-1 TWICE DAILY AS NEEDED HOME USE ONLY USE SPARINGLY, Disp: 6 tablet, Rfl: 1   amLODipine  (NORVASC ) 10 MG tablet, Take 1 tablet (10 mg total) by mouth daily., Disp: 90 tablet, Rfl: 3   aspirin  EC 81 MG tablet, Take 81 mg by mouth daily. Swallow whole., Disp: , Rfl:    Empagliflozin-metFORMIN  HCl ER (SYNJARDY XR) 12.08-998 MG TB24, Take 1 tablet by mouth daily., Disp: , Rfl:    esomeprazole  (NEXIUM ) 20 MG capsule, Take 20 mg by mouth daily., Disp: , Rfl:    HYDROmorphone  (DILAUDID ) 2 MG tablet, 1 every 4 hours  as needed pain caution drowsiness not for frequent use, Disp: 16 tablet, Rfl: 0   isosorbide  mononitrate (IMDUR ) 120 MG 24 hr tablet, TAKE 1 TABLET(120 MG) BY MOUTH DAILY, Disp: 90 tablet, Rfl: 2   losartan  (COZAAR ) 100 MG tablet, TAKE 1 TABLET  BY MOUTH EVERY DAY, Disp: 90 tablet, Rfl: 1   nitroGLYCERIN  (NITROSTAT ) 0.4 MG SL tablet, Place 1 tablet (0.4 mg total) under the tongue every 5 (five) minutes x 3 doses as needed for chest pain (if no relief after 3rd dose, proceed to ED for an evaluation)., Disp: 25 tablet, Rfl: 3   ondansetron  (ZOFRAN ) 8 MG tablet, TAKE 1 TABLET (8MG ) BY MOUTH EVERY 8 HOURS AS NEEDED FOR NAUSEA, Disp: 15 tablet, Rfl: 0   propranolol  ER (INDERAL  LA) 160 MG SR capsule, Take 1 capsule (160 mg total) by mouth daily., Disp: 90 capsule, Rfl: 3   ranolazine  (RANEXA ) 500 MG 12 hr tablet, Take 1 tablet (500 mg total) by mouth 2 (two) times daily., Disp: 60 tablet, Rfl: 6   rosuvastatin  (CRESTOR ) 10 MG tablet, TAKE 1 TABLET BY MOUTH EVERY DAY, Disp: 90 tablet, Rfl: 2   tamsulosin  (FLOMAX ) 0.4 MG CAPS capsule, TAKE 2 CAPSULES BY MOUTH AT BEDTIME, Disp: 180 capsule, Rfl: 1   tiZANidine  (ZANAFLEX ) 4 MG tablet, 1 q 8 hours prn caution drowsiness home use only, Disp: 24 tablet, Rfl: 1  Current Facility-Administered Medications:    sodium chloride  flush (NS) 0.9 % injection 3 mL, 3 mL, Intravenous, Q12H, Branch, Dorn FALCON, MD      Objective:   Vitals:   02/14/24 1301  BP: 118/66  Pulse: 69  SpO2: 95%  Weight: 226 lb 12.8 oz (102.9 kg)  Height: 5' 11 (1.803 m)    Estimated body mass index is 31.63 kg/m as calculated from the following:   Height as of this encounter: 5' 11 (1.803 m).   Weight as of this encounter: 226 lb 12.8 oz (102.9 kg).  @WEIGHTCHANGE @  [employer]   02/14/24 1301  Weight: 226 lb 12.8 oz (102.9 kg)     Physical Exam   General: No distress. Looks well O2 at rest: no Cane present: no Sitting in wheel chair: no Frail: no Obese: no Neuro: Alert and Oriented x 3. GCS 15. Speech normal Psych: Pleasant Resp:  Barrel Chest - n.  Wheeze - n, Crackles - n, No overt respiratory distress CVS: Normal heart sounds. Murmurs - no Ext: Stigmata of Connective Tissue Disease - no HEENT:  Normal upper airway. PEERL +. No post nasal drip        Assessment/     Assessment & Plan DOE (dyspnea on exertion)  Chronic cough  Wheeze  Former smoker  H/O chemical exposure  Symptoms out of proportion to the abnormality seen on the CT chest.  Differential diagnosis at this point includes diastolic dysfunction, pacemaker issues versus obstructive lung disease/asthma/chronic bronchitis.  Also cough neuropathy could be playing a role.  PLAN Patient Instructions     ICD-10-CM   1. DOE (dyspnea on exertion)  R06.09 CBC w/Diff    Perennial allergen profile IgE    Pulmonary function test    2. Chronic cough  R05.3 CBC w/Diff    Perennial allergen profile IgE    Pulmonary function test    3. Wheeze  R06.2 CBC w/Diff    Perennial allergen profile IgE    Pulmonary function test    4. Former smoker  Z87.891 CBC w/Diff    Perennial allergen  profile IgE    Pulmonary function test    5. H/O chemical exposure  Z87.898 CBC w/Diff    Perennial allergen profile IgE    Pulmonary function test    6. Cardiac pacemaker  Z95.0     7. Episode of dizziness  R42      Symptoms out of proportion to the abnormality seen on the CT chest.  Differential diagnosis at this point includes diastolic dysfunction, pacemaker issues versus obstructive lung disease/asthma/chronic bronchitis.  Also cough neuropathy could be playing a role.  The high exhaled nitric oxide test suggest asthma is playing a role  Plan - talk to cardiology and tell them we are suspecting asthm and to replace INDERAL  with a more specific Beta blocker  - Do CBC with differential and RAST allergy panel - Do full pulmonary function test - Take samples of Brztri and take them 2 puff 2 times daily on a scheduled basis and see if this helps symptoms - If workup is nondiagnostic or only partial improvement  - we might have to consider pulmonary stress test especially to look for chronotropic incompetence -= consider  stopping inderal  non specific beta blocker   Follow-up - 4 weeks with nurse practitioner either in [place] or in [place]     FOLLOWUP    Return for - 4 weeks with nurse practitioner either in [place] or in [place].    SIGNATURE    Dr. Dorethia Cave, M.D., F.C.C.P,  Pulmonary and Critical Care Medicine Staff Physician, [facility] Center Director - Interstitial Lung Disease  Program  Pulmonary Fibrosis [facility] at [facility] [place], [place], [address]  Pager: [phone], If no answer or between  15:00h - 7:00h: call [phone] Telephone: [phone]  1:38 PM 02/14/2024   Moderate Complexity MDM OFFICE  2021 E/M guidelines, first released in 2021, with minor revisions added in 2023 and 2024 Must meet the requirements for 2 out of 3 dimensions to qualify.    Number and complexity of problems addressed Amount and/or complexity of data reviewed Risk of complications and/or morbidity  One or more chronic illness with mild exacerbation, OR progression, OR  side effects of treatment  Two or more stable chronic illnesses  One undiagnosed new problem with uncertain prognosis  One acute illness with systemic symptoms   One Acute complicated injury Must meet the requirements for 1 of 3 of the categories)  Category 1: Tests and documents, historian  Any combination of 3 of the following:  Assessment requiring an independent historian  Review of prior external note(s) from each unique source  Review of results of each unique test  Ordering of each unique test    Category 2: Interpretation of tests   Independent interpretation of a test performed by another physician/other qualified health care professional (not separately reported)  Category 3: Discuss management/tests  Discussion of management or test interpretation with external physician/other qualified health care professional/appropriate source  (not separately reported) Moderate risk of morbidity from additional diagnostic testing or treatment Examples only:  Prescription drug management  Decision regarding minor surgery with identfied patient or procedure risk factors  Decision regarding elective major surgery without identified patient or procedure risk factors  Diagnosis or treatment significantly limited by social determinants of health             HIGh Complexity  OFFICE   2021 E/M guidelines, first released in 2021, with minor revisions added in 2023. Must  meet the requirements for 2 out of 3 dimensions to qualify.    Number and complexity of problems addressed Amount and/or complexity of data reviewed Risk of complications and/or morbidity  Severe exacerbation of chronic illness  Acute or chronic illnesses that may pose a threat to life or bodily function, e.g., multiple trauma, acute MI, pulmonary embolus, severe respiratory distress, progressive rheumatoid arthritis, psychiatric illness with potential threat to self or others, peritonitis, acute renal failure, abrupt change in neurological status Must meet the requirements for 2 of 3 of the categories)  Category 1: Tests and documents, historian  Any combination of 3 of the following:  Assessment requiring an independent historian  Review of prior external note(s) from each unique source  Review of results of each unique test  Ordering of each unique test    Category 2: Interpretation of tests    Independent interpretation of a test performed by another physician/other qualified health care professional (not separately reported)  Category 3: Discuss management/tests  Discussion of management or test interpretation with external physician/other qualified health care professional/appropriate source (not separately reported)  HIGH risk of morbidity from additional diagnostic testing or treatment Examples only:  Drug therapy requiring intensive  monitoring for toxicity  Decision for elective major surgery with identified pateint or procedure risk factors  Decision regarding hospitalization or escalation of level of care  Decision for DNR or to de-escalate care   Parenteral controlled  substances            LEGEND - Independent interpretation involves the interpretation of a test for which there is a CPT code, and an interpretation or report is customary. When a review and interpretation of a test is performed and documented by the provider, but not separately reported (billed), then this would represent an independent interpretation. This report does not need to conform to the usual standards of a complete report of the test. This does not include interpretation of tests that do not have formal reports such as a complete blood count with differential and blood cultures. Examples would include reviewing a chest radiograph and documenting in the medical record an interpretation, but not separately reporting (billing) the interpretation of the chest radiograph.   An appropriate source includes professionals who are not health care professionals but may be involved in the management of the patient, such as a clinical research associate, upper officer, case manager or teacher, and does not include discussion with family or informal caregivers.    - SDOH: SDOH are the conditions in the environments where people are born, live, learn, work, play, worship, and age that affect a wide range of health, functioning, and quality-of-life outcomes and risks. (e.g., housing, food insecurity, transportation, etc.). SDOH-related Z codes ranging from Z55-Z65 are the ICD-10-CM diagnosis codes used to document SDOH data Z55 - Problems related to education and literacy Z56 - Problems related to employment and unemployment Z57 - Occupational exposure to risk factors Z58 - Problems related to physical environment Z59 - Problems related to housing and  economic circumstances [phone] - Problems related to social environment [phone] - Problems related to upbringing [phone] - Other problems related to primary support group, including family circumstances Z69 - Problems related to certain psychosocial circumstances Z65 - Problems related to other psychosocial circumstances   CT Chest data from date: sept 2025*  - personally visualized and independently interpreted : tyes - my findings are: as below arrative & Impression  CLINICAL DATA:  Dyspnea on exertion, lung scarring   EXAM: CT  CHEST WITHOUT CONTRAST   TECHNIQUE: Multidetector CT imaging of the chest was performed following the standard protocol without IV contrast.   RADIATION DOSE REDUCTION: This exam was performed according to the departmental dose-optimization program which includes automated exposure control, adjustment of the mA and/or kV according to patient size and/or use of iterative reconstruction technique.   COMPARISON:  12/26/2012, 12/12/2023   FINDINGS: Cardiovascular: Thoracic aorta is nonaneurysmal. Aortic and coronary artery atherosclerosis. Central pulmonary vasculature is nondilated. Normal heart size. No pericardial effusion. Left-sided implanted cardiac device remains in place.   Mediastinum/Nodes: No enlarged mediastinal or axillary lymph nodes. Thyroid  gland, trachea, and esophagus demonstrate no significant findings.   Lungs/Pleura: Slightly low lung volumes with elevation of the hemidiaphragms. Dependent bibasilar scarring or atelectasis, similar to prior. The lungs are otherwise clear. No pleural effusion or pneumothorax.   Upper Abdomen: No acute abnormality.   Musculoskeletal: Left-sided gynecomastia. No acute osseous abnormality.   IMPRESSION: 1. No acute cardiopulmonary findings. 2. Slightly low lung volumes with elevation of the hemidiaphragms. Dependent bibasilar scarring or atelectasis, similar to prior. 3. Aortic and coronary artery  atherosclerosis (ICD10-I70.0).     Electronically Signed   By: Mabel Converse D.O.   On: 01/14/2024 13:07    PFT     Latest Ref Rng & Units 02/07/2021    1:56 PM 11/28/2016   12:05 PM  PFT Results  FVC-Pre L 2.40  3.29   FVC-Predicted Pre % 48  65   FVC-Post L 2.66  3.38   FVC-Predicted Post % 54  66   Pre FEV1/FVC % % 83  81   Post FEV1/FCV % % 82  80   FEV1-Pre L 1.99  2.66   FEV1-Predicted Pre % 54  70   FEV1-Post L 2.17  2.70   DLCO uncorrected ml/min/mmHg 27.62  24.01   DLCO UNC% % 97  68   DLCO corrected ml/min/mmHg 27.62  24.01   DLCO COR %Predicted % 97  68   DLVA Predicted % 131  100   TLC L 5.39  5.44   TLC % Predicted % 72  73   RV % Predicted % 99  96        LAB RESULTS last 96 hours No results found.       has a past medical history of ADD (attention deficit disorder), Allergy, Anginal pain, Anxiety, Aortic atherosclerosis (05/27/2020), Arthritis, AV block, 3rd degree (HCC), Colon polyps (2011), Complete heart block (HCC), Complication of anesthesia, Coronary artery disease, Degenerative disc disease, cervical, Depression, DM (diabetes mellitus) (HCC), Dyspnea, Gastrointestinal bleeding (10/15/2017), GERD (gastroesophageal reflux disease), Hyperlipidemia, Hypertension, Pacemaker, Palpitations (2003), Pneumonia, Prostatitis, Seasonal allergies, and Sleep apnea.   reports that he quit smoking about 20 years ago. His smoking use included cigarettes. He started smoking about 56 years ago. He has a 99.9 pack-year smoking history. He has quit using smokeless tobacco.  Past Surgical History:  Procedure Laterality Date   ANTERIOR CERVICAL DECOMP/DISCECTOMY FUSION  2002   Biventricular pacemaker upgrade/ system revision  04/02/2014   removal of previous atrial and ventricular leads with placement of a new MDT Consulta CRT-P system at [facility] by [facility]   COLONOSCOPY  2017   MS-MAC-suprep(good)-leiomyoma/TA   COLONOSCOPY W/ POLYPECTOMY   2011   tubular adenoma   CORONARY BALLOON ANGIOPLASTY N/A 06/15/2020   Procedure: CORONARY BALLOON ANGIOPLASTY;  Surgeon: Dann Candyce RAMAN, MD;  Location: [facility] INVASIVE CV LAB;  Service: Cardiovascular;  Laterality: N/A;   CORONARY STENT  INTERVENTION N/A 08/22/2018   Procedure: CORONARY STENT INTERVENTION;  Surgeon: Burnard Debby LABOR, MD;  Location: [facility] INVASIVE CV LAB;  Service: Cardiovascular;  Laterality: N/A;   CORONARY ULTRASOUND/IVUS N/A 06/15/2020   Procedure: Intravascular Ultrasound/IVUS;  Surgeon: Dann Candyce RAMAN, MD;  Location: [facility] INVASIVE CV LAB;  Service: Cardiovascular;  Laterality: N/A;   ESOPHAGOGASTRODUODENOSCOPY     Gastritis   INSERT / REPLACE / REMOVE PACEMAKER  12/17/2012   MDT Adapta L implanted by Dr Kelsie for mobitz II second degree AV block   KNEE SURGERY Right    LEFT HEART CATH AND CORONARY ANGIOGRAPHY N/A 08/22/2018   Procedure: LEFT HEART CATH AND CORONARY ANGIOGRAPHY;  Surgeon: Burnard Debby LABOR, MD;  Location: [facility] INVASIVE CV LAB;  Service: Cardiovascular;  Laterality: N/A;   LEFT HEART CATH AND CORONARY ANGIOGRAPHY N/A 06/15/2020   Procedure: LEFT HEART CATH AND CORONARY ANGIOGRAPHY;  Surgeon: Dann Candyce RAMAN, MD;  Location: [facility] INVASIVE CV LAB;  Service: Cardiovascular;  Laterality: N/A;   LEFT HEART CATH AND CORONARY ANGIOGRAPHY N/A 09/01/2021   Procedure: LEFT HEART CATH AND CORONARY ANGIOGRAPHY;  Surgeon: Burnard Debby LABOR, MD;  Location: [facility] INVASIVE CV LAB;  Service: Cardiovascular;  Laterality: N/A;   LEFT HEART CATHETERIZATION WITH CORONARY ANGIOGRAM N/A 10/17/2012   Procedure: LEFT HEART CATHETERIZATION WITH CORONARY ANGIOGRAM;  Surgeon: Maude JAYSON Emmer, MD;  Location: [facility] CATH LAB;  Service: Cardiovascular;  Laterality: N/A;   LEFT HEART CATHETERIZATION WITH CORONARY ANGIOGRAM N/A 12/17/2012   Procedure: LEFT HEART CATHETERIZATION WITH CORONARY ANGIOGRAM;  Surgeon: Peter M Jordan, MD;  Location: [facility] CATH LAB;  Service: Cardiovascular;  Laterality: N/A;    LEFT HEART CATHETERIZATION WITH CORONARY ANGIOGRAM N/A 06/11/2013   Procedure: LEFT HEART CATHETERIZATION WITH CORONARY ANGIOGRAM;  Surgeon: Deatrice LABOR Cage, MD;  Location: [facility] CATH LAB;  Service: Cardiovascular;  Laterality: N/A;   PERCUTANEOUS CORONARY STENT INTERVENTION (PCI-S)  10/17/2012   Procedure: PERCUTANEOUS CORONARY STENT INTERVENTION (PCI-S);  Surgeon: Maude JAYSON Emmer, MD;  Location: [facility] CATH LAB;  Service: Cardiovascular;;   PERMANENT PACEMAKER INSERTION N/A 12/17/2012   Procedure: PERMANENT PACEMAKER INSERTION;  Surgeon: Lynwood Kelsie, MD;  Location: [facility] CATH LAB;  Service: Cardiovascular;  Laterality: N/A;   POLYPECTOMY  2017   TA   SPINE SURGERY     UPPER GASTROINTESTINAL ENDOSCOPY      Allergies  Allergen Reactions   Morphine  And Codeine Itching and Rash    redness   Lasix  [Furosemide ]     Chest pain and tightness   Latex Rash    Immunization History  Administered Date(s) Administered   Fluad Quad(high Dose 65+) 01/24/2021   Fluad Trivalent(High Dose 65+) 02/11/2023   Fluzone Influenza virus vaccine,trivalent (IIV3), split virus 03/29/2004   INFLUENZA, HIGH DOSE SEASONAL PF 01/30/2022, 01/29/2024   Influenza Inj Mdck Quad With Preservative 01/30/2018   Influenza Split 02/05/2013   Influenza,inj,Quad PF,6+ Mos 01/20/2014, 01/19/2017   Influenza-Unspecified 05/22/2003, 01/16/2012, 01/15/2018, 02/12/2018, 01/30/2019, 03/26/2020, 02/21/2021, 01/30/2022   Moderna Covid-19 Fall Seasonal Vaccine 42yrs & older 03/06/2022, 01/08/2023   Moderna Covid-19 Vaccine Bivalent Booster 40yrs & up 02/21/2021   Moderna Sars-Covid-2 Vaccination 04/27/2020, 05/09/2020   PFIZER(Purple Top)SARS-COV-2 Vaccination 07/10/2019, 07/24/2019, 08/25/2019   PNEUMOCOCCAL CONJUGATE-20 03/31/2021   Pneumococcal Polysaccharide-23 04/03/2014, 07/12/2020   Respiratory Syncytial Virus Vaccine,Recomb Aduvanted(Arexvy) 03/24/2022   Td 06/15/2008   Tdap 04/07/2017, 03/29/2022   Unspecified SARS-COV-2  Vaccination 01/12/2024   Zoster Recombinant(Shingrix) 10/01/2020, 02/21/2021   Zoster, Live 12/17/2010    Family History  Problem Relation Age of  Onset   Heart disease Mother    Hypertension Mother        Carotid disease   Arthritis Mother    Alcohol abuse Father    Prostate cancer Brother 10   Breast cancer Maternal Aunt    Asthma Sister    Colon cancer Neg Hx    Colon polyps Neg Hx    Rectal cancer Neg Hx    Stomach cancer Neg Hx    Esophageal cancer Neg Hx      Current Outpatient Medications:    acetaminophen  (TYLENOL ) 500 MG tablet, Take 1,500 mg by mouth daily as needed for moderate pain., Disp: , Rfl:    albuterol  (VENTOLIN  HFA) 108 (90 Base) MCG/ACT inhaler, TAKE 2 PUFFS BY MOUTH EVERY 6 HOURS AS NEEDED FOR WHEEZE OR SHORTNESS OF BREATH, Disp: 18 each, Rfl: 1   ALPRAZolam  (XANAX ) 0.5 MG tablet, TAKE 1/2-1 TWICE DAILY AS NEEDED HOME USE ONLY USE SPARINGLY, Disp: 6 tablet, Rfl: 1   amLODipine  (NORVASC ) 10 MG tablet, Take 1 tablet (10 mg total) by mouth daily., Disp: 90 tablet, Rfl: 3   aspirin  EC 81 MG tablet, Take 81 mg by mouth daily. Swallow whole., Disp: , Rfl:    Empagliflozin-metFORMIN  HCl ER (SYNJARDY XR) 12.08-998 MG TB24, Take 1 tablet by mouth daily., Disp: , Rfl:    esomeprazole  (NEXIUM ) 20 MG capsule, Take 20 mg by mouth daily., Disp: , Rfl:    HYDROmorphone  (DILAUDID ) 2 MG tablet, 1 every 4 hours as needed pain caution drowsiness not for frequent use, Disp: 16 tablet, Rfl: 0   isosorbide  mononitrate (IMDUR ) 120 MG 24 hr tablet, TAKE 1 TABLET(120 MG) BY MOUTH DAILY, Disp: 90 tablet, Rfl: 2   losartan  (COZAAR ) 100 MG tablet, TAKE 1 TABLET BY MOUTH EVERY DAY, Disp: 90 tablet, Rfl: 1   nitroGLYCERIN  (NITROSTAT ) 0.4 MG SL tablet, Place 1 tablet (0.4 mg total) under the tongue every 5 (five) minutes x 3 doses as needed for chest pain (if no relief after 3rd dose, proceed to ED for an evaluation)., Disp: 25 tablet, Rfl: 3   ondansetron  (ZOFRAN ) 8 MG tablet, TAKE 1 TABLET  (8MG ) BY MOUTH EVERY 8 HOURS AS NEEDED FOR NAUSEA, Disp: 15 tablet, Rfl: 0   propranolol  ER (INDERAL  LA) 160 MG SR capsule, Take 1 capsule (160 mg total) by mouth daily., Disp: 90 capsule, Rfl: 3   ranolazine  (RANEXA ) 500 MG 12 hr tablet, Take 1 tablet (500 mg total) by mouth 2 (two) times daily., Disp: 60 tablet, Rfl: 6   rosuvastatin  (CRESTOR ) 10 MG tablet, TAKE 1 TABLET BY MOUTH EVERY DAY, Disp: 90 tablet, Rfl: 2   tamsulosin  (FLOMAX ) 0.4 MG CAPS capsule, TAKE 2 CAPSULES BY MOUTH AT BEDTIME, Disp: 180 capsule, Rfl: 1   tiZANidine  (ZANAFLEX ) 4 MG tablet, 1 q 8 hours prn caution drowsiness home use only, Disp: 24 tablet, Rfl: 1  Current Facility-Administered Medications:    sodium chloride  flush (NS) 0.9 % injection 3 mL, 3 mL, Intravenous, Q12H, Branch, Dorn FALCON, MD      Objective:   Vitals:   02/14/24 1301  BP: 118/66  Pulse: 69  SpO2: 95%  Weight: 226 lb 12.8 oz (102.9 kg)  Height: 5' 11 (1.803 m)    Estimated body mass index is 31.63 kg/m as calculated from the following:   Height as of this encounter: 5' 11 (1.803 m).   Weight as of this encounter: 226 lb 12.8 oz (102.9 kg).  @WEIGHTCHANGE @  [employer]  02/14/24 1301  Weight: 226 lb 12.8 oz (102.9 kg)     Physical Exam   General: No distress. Looks well O2 at rest: no Cane present: no Sitting in wheel chair: no Frail: no Obese: no Neuro: Alert and Oriented x 3. GCS 15. Speech normal Psych: Pleasant Resp:  Barrel Chest - no.  Wheeze - no, Crackles - no, No overt respiratory distress CVS: Normal heart sounds. Murmurs - no Ext: Stigmata of Connective Tissue Disease - no HEENT: Normal upper airway. PEERL +. 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 DOE (dyspnea  on exertion)  R06.09 CBC w/Diff    Perennial allergen profile IgE    Pulmonary function test    2. Chronic cough  R05.3 CBC w/Diff    Perennial allergen profile IgE    Pulmonary function test    3. Wheeze  R06.2 CBC w/Diff    Perennial allergen profile IgE    Pulmonary function test    4. Former smoker  Z87.891 CBC w/Diff    Perennial allergen profile IgE    Pulmonary function test    5. H/O chemical exposure  Z87.898 CBC w/Diff    Perennial allergen profile IgE    Pulmonary function test    6. Cardiac pacemaker  Z95.0     7. Episode of dizziness  R42      Symptoms out of proportion to the abnormality seen on the CT chest.  Differential diagnosis at this point includes diastolic dysfunction, pacemaker issues versus obstructive lung disease/asthma/chronic bronchitis.  Also cough neuropathy could be playing a role.  The high exhaled nitric oxide test suggest asthma is playing a role  Plan - talk to cardiology and tell them we are suspecting asthm and to replace INDERAL  with a more specific Beta blocker  - Do CBC with differential and RAST allergy panel - Do full pulmonary function test - Take samples of Brztri and take them 2 puff 2 times daily on a scheduled basis and see if this helps symptoms - If workup is nondiagnostic or only partial improvement  - we might have to consider pulmonary stress test especially to look for chronotropic incompetence -= consider stopping inderal  non specific beta blocker   Follow-up - 4 weeks with nurse practitioner either in [place] or in [place]     FOLLOWUP    Return for - 4 weeks with nurse practitioner either in [place] or in [place].    SIGNATURE    Dr. Dorethia Cave, M.D., F.C.C.P,  Pulmonary and Critical Care Medicine Staff Physician, [facility] Director - Interstitial Lung Disease  Program  Pulmonary Fibrosis [facility] at [facility] [place], [place],  [address]  Pager: [phone], If no answer or between  15:00h - 7:00h: call [phone] Telephone: [phone]  1:38 PM 02/14/2024

## 2024-02-14 NOTE — Addendum Note (Signed)
 Addended by: Dhruvan Gullion T on: 02/14/2024 01:40 PM   Modules accepted: Orders

## 2024-02-17 LAB — ALLERGEN PROFILE, PERENNIAL ALLERGEN IGE

## 2024-03-02 ENCOUNTER — Ambulatory Visit: Payer: Self-pay | Admitting: Internal Medicine

## 2024-03-04 ENCOUNTER — Ambulatory Visit: Admitting: Family Medicine

## 2024-03-06 DIAGNOSIS — F4312 Post-traumatic stress disorder, chronic: Secondary | ICD-10-CM | POA: Diagnosis not present

## 2024-03-10 LAB — MICROALBUMIN / CREATININE URINE RATIO: Microalb Creat Ratio: 3.5

## 2024-03-10 LAB — HEMOGLOBIN A1C: Hemoglobin A1C: 7.5

## 2024-03-18 DIAGNOSIS — F4312 Post-traumatic stress disorder, chronic: Secondary | ICD-10-CM | POA: Diagnosis not present

## 2024-03-24 ENCOUNTER — Ambulatory Visit (INDEPENDENT_AMBULATORY_CARE_PROVIDER_SITE_OTHER)

## 2024-03-24 ENCOUNTER — Telehealth: Payer: Self-pay

## 2024-03-24 DIAGNOSIS — Z87898 Personal history of other specified conditions: Secondary | ICD-10-CM

## 2024-03-24 DIAGNOSIS — R0609 Other forms of dyspnea: Secondary | ICD-10-CM

## 2024-03-24 DIAGNOSIS — R062 Wheezing: Secondary | ICD-10-CM

## 2024-03-24 DIAGNOSIS — R053 Chronic cough: Secondary | ICD-10-CM

## 2024-03-24 DIAGNOSIS — Z87891 Personal history of nicotine dependence: Secondary | ICD-10-CM

## 2024-03-24 LAB — PULMONARY FUNCTION TEST
DL/VA % pred: 133 %
DL/VA: 5.38 ml/min/mmHg/L
DLCO cor % pred: 76 %
DLCO cor: 21.08 ml/min/mmHg
DLCO unc % pred: 77 %
DLCO unc: 21.54 ml/min/mmHg
FEF 25-75 Post: 2.44 L/s
FEF 25-75 Pre: 1.95 L/s
FEF2575-%Change-Post: 24 %
FEF2575-%Pred-Post: 89 %
FEF2575-%Pred-Pre: 72 %
FEV1-%Change-Post: 3 %
FEV1-%Pred-Post: 52 %
FEV1-%Pred-Pre: 50 %
FEV1-Post: 1.87 L
FEV1-Pre: 1.8 L
FEV1FVC-%Change-Post: 0 %
FEV1FVC-%Pred-Pre: 112 %
FEV6-%Change-Post: 2 %
FEV6-%Pred-Post: 49 %
FEV6-%Pred-Pre: 47 %
FEV6-Post: 2.23 L
FEV6-Pre: 2.17 L
FEV6FVC-%Pred-Post: 105 %
FEV6FVC-%Pred-Pre: 105 %
FVC-%Change-Post: 2 %
FVC-%Pred-Post: 46 %
FVC-%Pred-Pre: 45 %
FVC-Post: 2.23 L
FVC-Pre: 2.17 L
Post FEV1/FVC ratio: 84 %
Post FEV6/FVC ratio: 100 %
Pre FEV1/FVC ratio: 83 %
Pre FEV6/FVC Ratio: 100 %
RV % pred: 103 %
RV: 2.62 L
TLC % pred: 65 %
TLC: 4.84 L

## 2024-03-24 NOTE — Progress Notes (Signed)
 Full PFT performed today.

## 2024-03-24 NOTE — Telephone Encounter (Signed)
 ATC X1 LMTCB   [facility] is now on a 2 hr delay on 03-25-2024. Pt's appt needs to be moved/ rescheduled.

## 2024-03-24 NOTE — Patient Instructions (Signed)
 Full PFT performed today.

## 2024-03-25 ENCOUNTER — Ambulatory Visit: Admitting: Primary Care

## 2024-03-31 ENCOUNTER — Ambulatory Visit: Admitting: Primary Care

## 2024-04-04 ENCOUNTER — Ambulatory Visit: Admitting: Primary Care

## 2024-04-18 ENCOUNTER — Telehealth: Payer: Self-pay

## 2024-04-18 ENCOUNTER — Ambulatory Visit: Payer: Medicare Other

## 2024-04-18 VITALS — Ht 71.0 in | Wt 226.0 lb

## 2024-04-18 DIAGNOSIS — Z0000 Encounter for general adult medical examination without abnormal findings: Secondary | ICD-10-CM | POA: Diagnosis not present

## 2024-04-18 NOTE — Patient Instructions (Signed)
 [name],  Thank you for taking the time for your Medicare Wellness Visit. I appreciate your continued commitment to your health goals. Please review the care plan we discussed, and feel free to reach out if I can assist you further.  Please note that Annual Wellness Visits do not include a physical exam. Some assessments may be limited, especially if the visit was conducted virtually. If needed, we may recommend an in-person follow-up with your provider.  Ongoing Care Seeing your primary care provider every 3 to 6 months helps us  monitor your health and provide consistent, personalized care.   Referrals If a referral was made during today's visit and you haven't received any updates within two weeks, please contact the referred provider directly to check on the status.  Recommended Screenings:  Health Maintenance  Topic Date Due   Eye exam for diabetics  Never done   Yearly kidney health urinalysis for diabetes  08/21/2023   Complete foot exam   04/25/2024   Hemoglobin A1C  07/08/2024   Yearly kidney function blood test for diabetes  01/08/2025   Medicare Annual Wellness Visit  04/18/2025   Colon Cancer Screening  02/17/2027   DTaP/Tdap/Td vaccine (4 - Td or Tdap) 03/29/2032   Pneumococcal Vaccine for age over 65  Completed   Flu Shot  Completed   Hepatitis C Screening  Completed   Zoster (Shingles) Vaccine  Completed   Meningitis B Vaccine  Aged Out   COVID-19 Vaccine  Discontinued       04/16/2024   10:39 AM  Advanced Directives  Does Patient Have a Medical Advance Directive? No  Would patient like information on creating a medical advance directive? Yes (MAU/Ambulatory/Procedural Areas - Information given)   Information on Advanced Care Planning can be found at [place]  Secretary of [facility] Advance Health Care Directives Advance Health Care Directives ([email or web address]   Vision: Annual vision screenings are recommended for early detection of glaucoma, cataracts, and  diabetic retinopathy. These exams can also reveal signs of chronic conditions such as diabetes and high blood pressure.  Dental: Annual dental screenings help detect early signs of oral cancer, gum disease, and other conditions linked to overall health, including heart disease and diabetes.  Please see the attached documents for additional preventive care recommendations.

## 2024-04-18 NOTE — Progress Notes (Signed)
 "  Chief Complaint  Patient presents with   Medicare Wellness     Subjective:   [name] is a 69 y.o. male who presents for a Medicare Annual Wellness Visit.  Visit info / Clinical Intake: Medicare Wellness Visit Type:: Subsequent Annual Wellness Visit Persons participating in visit and providing information:: patient Medicare Wellness Visit Mode:: Video Since this visit was completed virtually, some vitals may be partially provided or unavailable. Missing vitals are due to the limitations of the virtual format.: Documented vitals are patient reported If Telephone or Video please confirm:: I connected with patient using audio/video enable telemedicine. I verified patient identity with two identifiers, discussed telehealth limitations, and patient agreed to proceed. Patient Location:: home Provider Location:: office Interpreter Needed?: No Pre-visit prep was completed: yes AWV questionnaire completed by patient prior to visit?: yes Date:: 04/16/24 Living arrangements:: (Patient-Rptd) lives with spouse/significant other Patient's Overall Health Status Rating: (!) (Patient-Rptd) fair Typical amount of pain: (!) (Patient-Rptd) a lot Does pain affect daily life?: (!) (Patient-Rptd) yes Are you currently prescribed opioids?: (!) yes  Dietary Habits and Nutritional Risks How many meals a day?: (Patient-Rptd) 3 Eats fruit and vegetables daily?: (!) (Patient-Rptd) no Most meals are obtained by: (Patient-Rptd) preparing own meals In the last 2 weeks, have you had any of the following?: none Diabetic:: (!) yes Any non-healing wounds?: no How often do you check your BS?: as needed Would you like to be referred to a Nutritionist or for Diabetic Management? : no  Functional Status Activities of Daily Living (to include ambulation/medication): (Patient-Rptd) Independent Ambulation: (Patient-Rptd) Independent Medication Administration: (Patient-Rptd) Needs assistance (comment) Home  Management (perform basic housework or laundry): (Patient-Rptd) Needs assistance (comment) Manage your own finances?: (!) (Patient-Rptd) no Primary transportation is: (Patient-Rptd) driving Concerns about vision?: no *vision screening is required for WTM* Concerns about hearing?: no  Fall Screening Falls in the past year?: (Patient-Rptd) 0 Number of falls in past year: 0 Was there an injury with Fall?: 0 Fall Risk Category Calculator: 0 Patient Fall Risk Level: Low Fall Risk  Fall Risk Patient at Risk for Falls Due to: Impaired mobility Fall risk Follow up: Falls evaluation completed; Education provided; Falls prevention discussed  Home and Transportation Safety: All rugs have non-skid backing?: (Patient-Rptd) yes All stairs or steps have railings?: (Patient-Rptd) yes Grab bars in the bathtub or shower?: (!) (Patient-Rptd) no Have non-skid surface in bathtub or shower?: (Patient-Rptd) yes Good home lighting?: (Patient-Rptd) yes Regular seat belt use?: (Patient-Rptd) yes Hospital stays in the last year:: (Patient-Rptd) no  Cognitive Assessment Difficulty concentrating, remembering, or making decisions? : no Will 6CIT or Mini Cog be Completed: no 6CIT or Mini Cog Declined: patient alert, oriented, able to answer questions appropriately and recall recent events  Advance Directives (For Healthcare) Does Patient Have a Medical Advance Directive?: No Does patient want to make changes to medical advance directive?: No - Patient declined Type of Advance Directive: Living will; Healthcare Power of Attorney Copy of Healthcare Power of Attorney in Chart?: No - copy requested Would patient like information on creating a medical advance directive?: Yes (MAU/Ambulatory/Procedural Areas - Information given)  Reviewed/Updated  Reviewed/Updated: Reviewed All (Medical, Surgical, Family, Medications, Allergies, Care Teams, Patient Goals)    Allergies (verified) Morphine  and codeine, Lasix   [furosemide ], and Latex   Current Medications (verified) Outpatient Encounter Medications as of 04/18/2024  Medication Sig   acetaminophen  (TYLENOL ) 500 MG tablet Take 1,500 mg by mouth daily as needed for moderate pain.   albuterol  (VENTOLIN   HFA) 108 (90 Base) MCG/ACT inhaler TAKE 2 PUFFS BY MOUTH EVERY 6 HOURS AS NEEDED FOR WHEEZE OR SHORTNESS OF BREATH   ALPRAZolam  (XANAX ) 0.5 MG tablet TAKE 1/2-1 TWICE DAILY AS NEEDED HOME USE ONLY USE SPARINGLY   amLODipine  (NORVASC ) 10 MG tablet Take 1 tablet (10 mg total) by mouth daily.   aspirin  EC 81 MG tablet Take 81 mg by mouth daily. Swallow whole.   Empagliflozin-metFORMIN  HCl ER (SYNJARDY XR) 12.08-998 MG TB24 Take 1 tablet by mouth daily.   esomeprazole  (NEXIUM ) 20 MG capsule Take 20 mg by mouth daily.   HYDROmorphone  (DILAUDID ) 2 MG tablet 1 every 4 hours as needed pain caution drowsiness not for frequent use   isosorbide  mononitrate (IMDUR ) 120 MG 24 hr tablet TAKE 1 TABLET(120 MG) BY MOUTH DAILY   losartan  (COZAAR ) 100 MG tablet TAKE 1 TABLET BY MOUTH EVERY DAY   nitroGLYCERIN  (NITROSTAT ) 0.4 MG SL tablet Place 1 tablet (0.4 mg total) under the tongue every 5 (five) minutes x 3 doses as needed for chest pain (if no relief after 3rd dose, proceed to ED for an evaluation).   ondansetron  (ZOFRAN ) 8 MG tablet TAKE 1 TABLET (8MG ) BY MOUTH EVERY 8 HOURS AS NEEDED FOR NAUSEA   propranolol  ER (INDERAL  LA) 160 MG SR capsule Take 1 capsule (160 mg total) by mouth daily.   ranolazine  (RANEXA ) 500 MG 12 hr tablet Take 1 tablet (500 mg total) by mouth 2 (two) times daily.   rosuvastatin  (CRESTOR ) 10 MG tablet TAKE 1 TABLET BY MOUTH EVERY DAY   tamsulosin  (FLOMAX ) 0.4 MG CAPS capsule TAKE 2 CAPSULES BY MOUTH AT BEDTIME   tiZANidine  (ZANAFLEX ) 4 MG tablet 1 q 8 hours prn caution drowsiness home use only   budesonide-glycopyrrolate-formoterol (BREZTRI  AEROSPHERE) 160-9-4.8 MCG/ACT AERO inhaler Inhale 2 puffs into the lungs in the morning and at bedtime.  (Patient not taking: Reported on 04/18/2024)   Facility-Administered Encounter Medications as of 04/18/2024  Medication   sodium chloride  flush (NS) 0.9 % injection 3 mL    History: Past Medical History:  Diagnosis Date   ADD (attention deficit disorder)    Rx-Adderall   Allergy    Anginal pain    Anxiety    on meds   Aortic atherosclerosis 05/27/2020   Seen on x-ray being treated with statin   Arthritis    AV block, 3rd degree (HCC)    Colon polyps 2011   tubular adenoma by excisional biopsy during colonoscopy   Complete heart block (HCC)    a. s/p PPM Placement in 12/2012 with Medtronic CRT-P placement in 03/2014   Complication of anesthesia    woke up fighting   Coronary artery disease    a. s/p PCI of the RCA in 2014 b. 08/2018: patent RCA stent with new mid-LAD 85% stenosis (treated with PCI/DES) and 85% distal OM2 stenosis (med management recommended)   Degenerative disc disease, cervical    Depression    PTSD   DM (diabetes mellitus) (HCC)    on meds   Dyspnea    with exertion   Gastrointestinal bleeding 10/15/2017   GERD (gastroesophageal reflux disease)    Heart murmur    Hyperlipidemia    on meds   Hypertension    on meds   Pacemaker    Palpitations 2003   Holter in 2003: PACs and PVCs; negative stress nuclear test in 2005; right bundle branch block; echo in 2008-mild LVH, otherwise normal; 2008-negative stress nuclear test.   Pneumonia  Prostatitis    prostate calcifications by CT   Seasonal allergies    Sleep apnea    questionable diagnosis   Past Surgical History:  Procedure Laterality Date   ANTERIOR CERVICAL DECOMP/DISCECTOMY FUSION  2002   Biventricular pacemaker upgrade/ system revision  04/02/2014   removal of previous atrial and ventricular leads with placement of a new MDT Consulta CRT-P system at [facility] by Dr [facility]   COLONOSCOPY  2017   MS-MAC-suprep(good)-leiomyoma/TA   COLONOSCOPY W/ POLYPECTOMY  2011   tubular  adenoma   CORONARY BALLOON ANGIOPLASTY N/A 06/15/2020   Procedure: CORONARY BALLOON ANGIOPLASTY;  Surgeon: Dann Candyce RAMAN, MD;  Location: [facility] INVASIVE CV LAB;  Service: Cardiovascular;  Laterality: N/A;   CORONARY STENT INTERVENTION N/A 08/22/2018   Procedure: CORONARY STENT INTERVENTION;  Surgeon: Burnard Debby LABOR, MD;  Location: [facility] INVASIVE CV LAB;  Service: Cardiovascular;  Laterality: N/A;   CORONARY ULTRASOUND/IVUS N/A 06/15/2020   Procedure: Intravascular Ultrasound/IVUS;  Surgeon: Dann Candyce RAMAN, MD;  Location: [facility] INVASIVE CV LAB;  Service: Cardiovascular;  Laterality: N/A;   ESOPHAGOGASTRODUODENOSCOPY     Gastritis   HERNIA REPAIR     INSERT / REPLACE / REMOVE PACEMAKER  12/17/2012   MDT Adapta L implanted by Dr Kelsie for mobitz II second degree AV block   KNEE SURGERY Right    LEFT HEART CATH AND CORONARY ANGIOGRAPHY N/A 08/22/2018   Procedure: LEFT HEART CATH AND CORONARY ANGIOGRAPHY;  Surgeon: Burnard Debby LABOR, MD;  Location: [facility] INVASIVE CV LAB;  Service: Cardiovascular;  Laterality: N/A;   LEFT HEART CATH AND CORONARY ANGIOGRAPHY N/A 06/15/2020   Procedure: LEFT HEART CATH AND CORONARY ANGIOGRAPHY;  Surgeon: Dann Candyce RAMAN, MD;  Location: [facility] INVASIVE CV LAB;  Service: Cardiovascular;  Laterality: N/A;   LEFT HEART CATH AND CORONARY ANGIOGRAPHY N/A 09/01/2021   Procedure: LEFT HEART CATH AND CORONARY ANGIOGRAPHY;  Surgeon: Burnard Debby LABOR, MD;  Location: [facility] INVASIVE CV LAB;  Service: Cardiovascular;  Laterality: N/A;   LEFT HEART CATHETERIZATION WITH CORONARY ANGIOGRAM N/A 10/17/2012   Procedure: LEFT HEART CATHETERIZATION WITH CORONARY ANGIOGRAM;  Surgeon: Maude JAYSON Emmer, MD;  Location: [facility] CATH LAB;  Service: Cardiovascular;  Laterality: N/A;   LEFT HEART CATHETERIZATION WITH CORONARY ANGIOGRAM N/A 12/17/2012   Procedure: LEFT HEART CATHETERIZATION WITH CORONARY ANGIOGRAM;  Surgeon: Peter M Jordan, MD;  Location: [facility] CATH LAB;  Service: Cardiovascular;  Laterality:  N/A;   LEFT HEART CATHETERIZATION WITH CORONARY ANGIOGRAM N/A 06/11/2013   Procedure: LEFT HEART CATHETERIZATION WITH CORONARY ANGIOGRAM;  Surgeon: Deatrice LABOR Cage, MD;  Location: [facility] CATH LAB;  Service: Cardiovascular;  Laterality: N/A;   PERCUTANEOUS CORONARY STENT INTERVENTION (PCI-S)  10/17/2012   Procedure: PERCUTANEOUS CORONARY STENT INTERVENTION (PCI-S);  Surgeon: Maude JAYSON Emmer, MD;  Location: [facility] CATH LAB;  Service: Cardiovascular;;   PERMANENT PACEMAKER INSERTION N/A 12/17/2012   Procedure: PERMANENT PACEMAKER INSERTION;  Surgeon: Lynwood Kelsie, MD;  Location: [facility] CATH LAB;  Service: Cardiovascular;  Laterality: N/A;   POLYPECTOMY  2017   TA   SPINE SURGERY     UPPER GASTROINTESTINAL ENDOSCOPY     Family History  Problem Relation Age of Onset   Heart disease Mother    Hypertension Mother        Carotid disease   Arthritis Mother    Alcohol abuse Father    Prostate cancer Brother 44   Breast cancer Maternal Aunt    Asthma Sister    Colon cancer Neg Hx    Colon polyps  Neg Hx    Rectal cancer Neg Hx    Stomach cancer Neg Hx    Esophageal cancer Neg Hx    Social History   Occupational History   Not on file  Tobacco Use   Smoking status: Former    Current packs/day: 0.00    Average packs/day: 2.5 packs/day for 40.0 years (99.9 ttl pk-yrs)    Types: Cigarettes    Start date: 04/18/1967    Quit date: 12/20/2003    Years since quitting: 20.3   Smokeless tobacco: Former  Building Services Engineer status: Former  Substance and Sexual Activity   Alcohol use: Not Currently    Alcohol/week: 7.0 - 14.0 standard drinks of alcohol    Comment: rare   Drug use: Not Currently    Types: Marijuana    Comment: 30 + years ago - crank, marjiuanna, ect.   Sexual activity: Not Currently    Birth control/protection: Surgical, None   Tobacco Counseling Counseling given: Not Answered  SDOH Screenings   Food Insecurity: No Food Insecurity (04/16/2024)  Housing: Low Risk (04/16/2024)   Transportation Needs: No Transportation Needs (04/16/2024)  Utilities: Not At Risk (04/18/2024)  Alcohol Screen: Low Risk (04/16/2024)  Depression (PHQ2-9): High Risk (04/18/2024)  Financial Resource Strain: Low Risk (04/16/2024)  Physical Activity: Inactive (04/16/2024)  Social Connections: Socially Isolated (04/16/2024)  Stress: Stress Concern Present (04/16/2024)  Tobacco Use: Medium Risk (04/18/2024)  Health Literacy: Adequate Health Literacy (04/18/2024)   See flowsheets for full screening details  Depression Screen PHQ 2 & 9 Depression Scale- Over the past 2 weeks, how often have you been bothered by any of the following problems? Little interest or pleasure in doing things: 2 Feeling down, depressed, or hopeless (PHQ Adolescent also includes...irritable): 2 PHQ-2 Total Score: 4 Trouble falling or staying asleep, or sleeping too much: 3 Feeling tired or having little energy: 3 Poor appetite or overeating (PHQ Adolescent also includes...weight loss): 1 Feeling bad about yourself - or that you are a failure or have let yourself or your family down: 2 Trouble concentrating on things, such as reading the newspaper or watching television (PHQ Adolescent also includes...like school work): 2 Moving or speaking so slowly that other people could have noticed. Or the opposite - being so fidgety or restless that you have been moving around a lot more than usual: 0 Thoughts that you would be better off dead, or of hurting yourself in some way: 0 PHQ-9 Total Score: 15 If you checked off any problems, how difficult have these problems made it for you to do your work, take care of things at home, or get along with other people?: Very difficult  Depression Treatment Depression Interventions/Treatment : Currently on Treatment; Medication     Goals Addressed             This Visit's Progress    Maintain health and independence   On track            Objective:    Today's Vitals    04/18/24 1500  Weight: 226 lb (102.5 kg)  Height: 5' 11 (1.803 m)   Body mass index is 31.52 kg/m.  Hearing/Vision screen Hearing Screening - Comments:: Patient is able to hear conversational tones without difficulty. No issues reported.   Vision Screening - Comments:: Up to date with routine eye exams with eye provider under [facility] Health Maintenance Health Maintenance  Topic Date Due   OPHTHALMOLOGY EXAM  Never done   FOOT EXAM  04/25/2024   HEMOGLOBIN A1C  09/07/2024   Diabetic kidney evaluation - eGFR measurement  01/08/2025   Diabetic kidney evaluation - Urine ACR  03/10/2025   Medicare Annual Wellness (AWV)  04/18/2025   Colonoscopy  02/17/2027   DTaP/Tdap/Td (4 - Td or Tdap) 03/29/2032   Pneumococcal Vaccine: 50+ Years  Completed   Influenza Vaccine  Completed   Hepatitis C Screening  Completed   Zoster Vaccines- Shingrix  Completed   Meningococcal B Vaccine  Aged Out   COVID-19 Vaccine  Discontinued        Assessment/Plan:  This is a routine wellness examination for [name].  Patient Care Team: Alphonsa Glendia LABOR, MD as PCP - General (Family Medicine) Alvan, Dorn FALCON, MD as PCP - Cardiology (Cardiology) Mealor, Eulas BRAVO, MD as PCP - Electrophysiology (Cardiology) Marcelino Nurse, MD as Consulting Physician (Pain Medicine) [facility] as Referring Physician (General Practice) [facility] Geronimo Dorethia, MD as Consulting Physician (Pulmonary Disease)  I have personally reviewed and noted the following in the patients chart:   Medical and social history Use of alcohol, tobacco or illicit drugs  Current medications and supplements including opioid prescriptions. Functional ability and status Nutritional status Physical activity Advanced directives List of other physicians Hospitalizations, surgeries, and ER visits in previous 12 months Vitals Screenings to include cognitive, depression, and falls Referrals and  appointments  Orders Placed This Encounter  Procedures   Microalbumin / creatinine urine ratio    This external order was created through the Results Console.   Hemoglobin A1c    This external order was created through the Results Console.   In addition, I have reviewed and discussed with patient certain preventive protocols, quality metrics, and best practice recommendations. A written personalized care plan for preventive services as well as general preventive health recommendations were provided to patient.   Lavelle Charmaine Browner, LPN   11/22/7971   Return in 1 year (on 04/18/2025).  After Visit Summary: (MyChart) Due to this being a telephonic visit, the after visit summary with patients personalized plan was offered to patient via MyChart   Nurse Notes: Patient advised to keep follow-up appointment with PCP (06/16/24 @ 2:30) Separate telephone note sent for (request for labs before next visit and referral follow up )   "

## 2024-04-18 NOTE — Telephone Encounter (Signed)
 Patient seen for AWV and is asking if labs are needed before his next visit (07/09/24/ Physical)   He is also asking for update on referral to urology; states that he hasn't heard anything.

## 2024-04-19 ENCOUNTER — Telehealth: Payer: Self-pay | Admitting: Family Medicine

## 2024-04-19 NOTE — Telephone Encounter (Signed)
 Nurses Please  Please go ahead and put in referral to urology again-[facility] Dr. Sherrilee due to hematuria  Please let the patient know that we sent this referral about 3 months ago not sure why he never heard anything from urology Please give him the phone number to Dr. Little office here in town and hopefully they can get him an appointment directly but we are resending the referral  Unfortunately referrals are sent to referral team which sends it to the specialists which have been the specialists office is responsible to call the patient-unfortunately sometimes through this multi step process the follow-through does not occur If he connects with the specialist office and runs into issues please have him send me a MyChart message so I am aware of it and can try to do my best to intervene thank you

## 2024-04-19 NOTE — Telephone Encounter (Signed)
 Nurses Please note that the patient did request labs before his next visit I would recommend A1c, metabolic 7, lipid, urine ACR, liver profile  Diagnosis type 2 diabetes, hyperlipidemia, HTN, high risk med  He can do these labs close to his follow-up visit thank you

## 2024-04-22 ENCOUNTER — Encounter: Payer: Self-pay | Admitting: Family Medicine

## 2024-04-22 ENCOUNTER — Other Ambulatory Visit: Payer: Self-pay

## 2024-04-22 DIAGNOSIS — Z79899 Other long term (current) drug therapy: Secondary | ICD-10-CM

## 2024-04-22 DIAGNOSIS — E118 Type 2 diabetes mellitus with unspecified complications: Secondary | ICD-10-CM

## 2024-04-22 DIAGNOSIS — I10 Essential (primary) hypertension: Secondary | ICD-10-CM

## 2024-04-22 DIAGNOSIS — E7849 Other hyperlipidemia: Secondary | ICD-10-CM

## 2024-04-23 ENCOUNTER — Other Ambulatory Visit: Payer: Self-pay

## 2024-04-23 ENCOUNTER — Encounter: Payer: Self-pay | Admitting: Family Medicine

## 2024-04-23 ENCOUNTER — Ambulatory Visit (INDEPENDENT_AMBULATORY_CARE_PROVIDER_SITE_OTHER): Admitting: Family Medicine

## 2024-04-23 VITALS — BP 140/76 | HR 73 | Temp 97.8°F | Ht 71.0 in | Wt 216.4 lb

## 2024-04-23 DIAGNOSIS — Z79899 Other long term (current) drug therapy: Secondary | ICD-10-CM

## 2024-04-23 DIAGNOSIS — Z0000 Encounter for general adult medical examination without abnormal findings: Secondary | ICD-10-CM

## 2024-04-23 DIAGNOSIS — M546 Pain in thoracic spine: Secondary | ICD-10-CM | POA: Diagnosis not present

## 2024-04-23 DIAGNOSIS — R319 Hematuria, unspecified: Secondary | ICD-10-CM

## 2024-04-23 DIAGNOSIS — E118 Type 2 diabetes mellitus with unspecified complications: Secondary | ICD-10-CM

## 2024-04-23 DIAGNOSIS — G8929 Other chronic pain: Secondary | ICD-10-CM | POA: Diagnosis not present

## 2024-04-23 DIAGNOSIS — R059 Cough, unspecified: Secondary | ICD-10-CM | POA: Diagnosis not present

## 2024-04-23 DIAGNOSIS — Z0001 Encounter for general adult medical examination with abnormal findings: Secondary | ICD-10-CM | POA: Diagnosis not present

## 2024-04-23 DIAGNOSIS — I10 Essential (primary) hypertension: Secondary | ICD-10-CM | POA: Diagnosis not present

## 2024-04-23 MED ORDER — HYDROMORPHONE HCL 4 MG PO TABS: ORAL_TABLET | ORAL | 0 refills | Status: AC

## 2024-04-23 NOTE — Progress Notes (Signed)
" ° °  Subjective:    Patient ID: [name], male    DOB: [birth year], 69 y.o.   MRN: [id number]  HPI The patient comes in today for a wellness visit.    A review of their health history was completed.  A review of medications was also completed.  Any needed refills; yes  Eating habits: Healthy eating habits  Falls/  MVA accidents in past few months: No accidents or injuries  Regular exercise: Stays physically active does walking  Specialist pt sees on regular basis: Cardiology pulmonary  Preventative health issues were discussed.   Additional concerns: None  Hematuria   Review of Systems     Objective:   Physical Exam General-in no acute distress Eyes-no discharge Lungs-respiratory rate normal, CTA CV-no murmurs,RRR Extremities skin warm dry no edema Neuro grossly normal Behavior normal, alert  Patient relates he has ongoing back pain discomfort.  Sometimes the impingement of a nerve root radiates into his leg.  He has history of a benign growth on the spinal cord He relates that the hydromorphone  2 mg helps but he typically has to take 2 tablets when the pain is severe He does not abuse medicine Only takes it when at home Does not cause drowsiness Uses the medication sparingly Patient was told a small supply would be given if down the road he needs more frequently he will need to do follow-up office visit for pain management control       Assessment & Plan:  1. Well adult exam (Primary) Adult wellness-complete.wellness physical was conducted today. Importance of diet and exercise were discussed in detail.  Importance of stress reduction and healthy living were discussed.  In addition to this a discussion regarding safety was also covered.  We also reviewed over immunizations and gave recommendations regarding current immunization needed for age.   In addition to this additional areas were also touched on including: Preventative health exams  needed:  Colonoscopy this summer  Patient was advised yearly wellness exam   2. Cough, unspecified type More than likely related to laryngeal irritation if it continues referral to ENT patient denies reflux has already seen pulmonary  3. High risk medication use Labs ordered patient will do these in approximately a month  4. Primary hypertension Blood pressure good control continue healthy diet  5. DM type 2, controlled, with complication (HCC) Check A1c await results  6. Hematuria, unspecified type Urology Patient more than likely will need a cystoscope He has severe fear of these type of procedures more than likely would need to be sedated  7. Chronic midline thoracic back pain Patient uses pain medicine intermittently for this Dilaudid  states it does not cause drowsiness we will allow for a small quantity he can connect with us  when he needs refills  "

## 2024-04-24 NOTE — Telephone Encounter (Signed)
 PT sent message verbalizing understanding

## 2024-05-13 ENCOUNTER — Encounter: Payer: Self-pay | Admitting: Primary Care

## 2024-05-13 ENCOUNTER — Other Ambulatory Visit: Payer: Self-pay | Admitting: Primary Care

## 2024-05-13 ENCOUNTER — Ambulatory Visit: Admitting: Primary Care

## 2024-05-13 VITALS — BP 134/70 | HR 70 | Temp 98.0°F | Ht 71.0 in | Wt 216.0 lb

## 2024-05-13 DIAGNOSIS — Z87891 Personal history of nicotine dependence: Secondary | ICD-10-CM

## 2024-05-13 DIAGNOSIS — J984 Other disorders of lung: Secondary | ICD-10-CM | POA: Diagnosis not present

## 2024-05-13 DIAGNOSIS — R059 Cough, unspecified: Secondary | ICD-10-CM

## 2024-05-13 DIAGNOSIS — R053 Chronic cough: Secondary | ICD-10-CM

## 2024-05-13 LAB — POCT EXHALED NITRIC OXIDE: FeNO level (ppb): 54 (ref ?–50)

## 2024-05-13 MED ORDER — PULMICORT FLEXHALER 90 MCG/ACT IN AEPB: INHALATION_SPRAY | RESPIRATORY_TRACT | 1 refills | Status: AC

## 2024-05-13 MED ORDER — BENZONATATE 200 MG PO CAPS: 200.0000 mg | ORAL_CAPSULE | Freq: Three times a day (TID) | ORAL | 1 refills | Status: AC | PRN

## 2024-05-13 NOTE — Patient Instructions (Addendum)
" ° °  VISIT SUMMARY: During your visit, we discussed your ongoing breathing problems, including your chronic cough and restrictive lung disease. We reviewed your recent weight loss and its positive impact on your breathing. We also addressed your current medications and made adjustments to help manage your symptoms more effectively.  YOUR PLAN: -CHRONIC COUGH WITH EOSINOPHILIC AIRWAY INFLAMMATION: Your chronic cough is likely due to eosinophilic airway inflammation, which means your airways are inflamed due to a type of white blood cell called eosinophils. We have prescribed a Pulmicort  inhaler to use as needed and Tessalon  Perles (benzonatate ) for cough management, up to three times a day as needed. If your symptoms persist, we may consider adding a long-acting bronchodilator plus inhaled corticosteroid at your next follow-up.  -RESTRICTIVE LUNG DISEASE DUE TO MECHANICAL FACTORS: Your restrictive lung disease is likely due to mechanical factors such as obesity, deconditioning, and an elevated hemidiaphragm. This condition restricts your lung capacity, making it harder to breathe. Your symptoms have improved with weight loss from the GLP-1 medication. We will continue to monitor your respiratory symptoms and weight management, and follow up in 3-4 months or sooner if needed.  INSTRUCTIONS: Please follow the prescribed medication plan for your chronic cough and continue with your weight management efforts. We will follow up in 3-4 months to reassess your symptoms and make any necessary adjustments to your treatment plan. If you experience any worsening of symptoms or have any concerns, please contact our office.   Follow-up 3 months with Dr. Geronimo or Landry NP  Contains text generated by [employer].   "

## 2024-05-13 NOTE — Progress Notes (Signed)
 "  @Patient  ID: [name], male    DOB: 05-Aug-1955, 69 y.o.   MRN: [id number]  No chief complaint on file.   Referring provider: Alphonsa Glendia LABOR, MD  HPI: 69 year old male, former smoker. PMH significant for OSA, CAD, HTN, aortic atherosclerosis, GERD, hyperlipidemia, chronic pain. Patient of Dr. Geronimo.   Previous [facility] pulmonary encounter:   02/14/2024 -   Chief Complaint  Patient presents with   Consult    Referred by Dr Alphonsa for eval of possible ILD. He has had DOE for the past 3-4 years. He gets winded walking up a incline or sometimes with just talking. He also c/o non prod cough, sometimes wakes him in the night.    [name] 69 y.o. -  [name] is a 69 year old male with coronary artery disease and a pacemaker who presents with worsening shortness of breath and a severe cough. He was referred by Dr. Juliet for evaluation of his breathing problems.  He is a [place] veteran.  He was in the [employer] [place] operative [place]  He has PTSD and in the 1978 was hospitalized at [address] by [facility] because of depressive disorder.  In 2021 he did have COVID that was mild.  He gets COVID vaccines.  He is a former smoker.  He has got exposure to methane chemicals.  In 72s and 1990s he did have a bird.  He has a cat right now.  His current house does not have mold.  He has been experiencing breathing problems since 2022, with symptoms worsening over time. Shortness of breath occurs during activities such as bending over, climbing stairs, or lying in bed. These episodes are not constant but are activity-triggered. During these episodes, his blood pressure drops significantly, sometimes to 80/60 mmHg, compared to his usual 128-130/80 mmHg. He has a pacemaker that runs continuously. A recent cardiac catheterization at the [facility] in [place] showed no significant blockages, with the worst being 40% stenosis. He has a history of  two stents placed previously.  He has a severe, gagging cough that has persisted for over a year, sometimes waking him at night. The cough is non-productive and relieved only by holding his nose and blowing to pop his ears. No seasonal allergies, but he has a history of acid reflux. He is unsure of his current medications as his wife manages them, but he mentions taking blood pressure medications including Andral.  He experiences wheezing, particularly when trying to sleep, and frequently clears his throat. No significant sinus drainage and does not wheeze with the cough. A CT scan of his lungs showed scarring.  His social history includes a past occupation in environmental work involving solicitor, which he associates with PTSD. He is a former smoker and has no current exposure to pet birds or significant mold in his hom   He is on INDERAL  a non specific beta blocker   05/13/2024- INTERIM HX   Symptoms out of proportion to abnormality seen on CT chest Differetial includes diastolic dysfunction, obstructive lung disease vs asthma Cough neuropathy could be playing a role  High exhaled nitric oxide  suggest asthma  RAST allergy panel normal, eos absolute 200  Pulmonary function testing showed severe restriction without BD response. Modest improvement in small airways flow by +24%  Recommend cardiology replace inderal   Sample Breztri   Restrictive lung diease with coexisting eosinophilic airway inflammation (type 2)  without physiological obstruction   Restriction with small airway involvement  Mechanical restriction from low lung volumes/elevated hemidiaphragm - Causes when bilateral likely not phrenic nerve paralysis but pain, shallow breathing, obesity, deconditioning, covid, post operative No parenchymal disease Obesity Any prior abdominal surgeries  Phenic nerve dysfunction- reduced diaphragm contraction - causes can be post viral, atelectasis, chronic  shallow breathing, pain, deconditioning, obesity Post viral covid changes  FENO improves with steroids   Pulmonary function testing demonstrates a restrictive ventilatory defect with reduced lung volumes and preserved FEV1/FVC ratio. Ct imaging shows low lung volumes ith elevated hemidiaphragm and no acute parenchymal disease, suggesting extrapulmonary mechanical etiology. Elvated FENO indicated concomitant eosinophilic airrway inflammation which may contribute to symptoms but does not account for the restrictive shysicology     05/13/2024- interim hx  Discussed the use of AI scribe software for clinical note transcription with the patient, who gave verbal consent to proceed.  History of Present Illness [name] [name] is a 69 year old male with coronary artery disease and restrictive lung disease who presents for follow-up of breathing problems.  He has experienced progressive breathing problems since 2022, with shortness of breath during activities such as bending over, climbing stairs, and lying in bed. A cardiac catheterization at the [facility] in [facility] showed no significant blockages, despite a history of two cardiac stents. 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  A CT scan in September showed no acute cardiopulmonary findings, slightly low lung volumes with elevation of the hemidiaphragm, and some scarring or atelectasis similar to prior imaging. An elevated nitric oxide  test suggested airway inflammation. Allergy testing was normal, and eosinophils were 200. A breathing test in December showed restriction in the lungs without bronchodilator response, with moderate improvement in small airways by 24%.  He reports improvement in breathing following weight loss from 225 pounds to 208 pounds after starting GLP-1 medication. He can now climb stairs and walk uphill without needing to stop, attributing this to  reduced mechanical restriction from abdominal weight loss.  He is not currently using any inhalers, as previous inhalers did not work well and interfered with his heart medication. He experiences a sporadic 'terrible cough' described as feeling like 'steel wool in my throat,' which is dry without mucus. No postnasal drip, but he mentions a sensation similar to ear issues from childhood. He experiences chest tightness or shortness of breath during coughing episodes.  He experiences fatigue, rated as a three out of five, noting improvement from previous levels. Occasional nausea, attributed to the GLP-1 medication, is described as intense and similar to seasickness. No vomiting or reflux.  He has a history of anxiety and depression, rated 100% for PTSD, and occasionally uses Xanax . 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 He has tried chiropractic care in the past. For his cough, he uses cough drops occasionally, which sometimes help.  SYMPTOM SCALE - ILD 02/14/2024 05/13/2024  Current weight Exam nitric oxide  58 ppb and abnormal   O2 use ra RA  Shortness of Breath 0 -> 5 scale with 5 being worst (score 6 If unable to do)   At rest 2 1  Simple tasks - showers, clothes change, eating, shaving 3 0  Household (dishes, doing bed, laundry) 3 0  Shopping x x  Walking level at own pace 2 0  Walking up Stairs 4 1  Total (30-36) Dyspnea Score 14 2  How bad is your cough? 4 3- dry cough  How bad is your fatigue 4 3  How bad is appetiee 2 1- GLP related  How bad is nausea 3 1  How bad is vomiting?  1 0  How bad is diarrhea? 1 0  How bad is anxiety? 5 5- not new, prn xanax   How bad is depression 5 5  Any chronic pain - if so where and how bad x Chronic back and neck pain, doing acupuncture     Allergies[1]  Immunization History  Administered Date(s) Administered   Fluad Quad(high Dose 65+) 01/24/2021   Fluad  Trivalent(High Dose 65+) 02/11/2023   Fluzone Influenza virus vaccine,trivalent (IIV3), split virus 03/29/2004   INFLUENZA, HIGH DOSE SEASONAL PF 01/30/2022, 01/29/2024   Influenza Inj Mdck Quad With Preservative 01/30/2018   Influenza Split 02/05/2013   Influenza,inj,Quad PF,6+ Mos 01/20/2014, 01/19/2017   Influenza-Unspecified 05/22/2003, 01/16/2012, 01/15/2018, 02/12/2018, 01/30/2019, 03/26/2020, 02/21/2021, 01/30/2022   Moderna Covid-19 Fall Seasonal Vaccine 52yrs & older 03/06/2022, 01/08/2023   Moderna Covid-19 Vaccine Bivalent Booster 44yrs & up 02/21/2021   Moderna Sars-Covid-2 Vaccination 04/27/2020, 05/09/2020   PFIZER(Purple Top)SARS-COV-2 Vaccination 07/10/2019, 07/24/2019, 08/25/2019   PNEUMOCOCCAL CONJUGATE-20 03/31/2021   Pneumococcal Polysaccharide-23 04/03/2014, 07/12/2020   Respiratory Syncytial Virus Vaccine,Recomb Aduvanted(Arexvy) 03/24/2022   Td 06/15/2008   Tdap 04/07/2017, 03/29/2022   Unspecified SARS-COV-2 Vaccination 01/12/2024   Zoster Recombinant(Shingrix) 10/01/2020, 02/21/2021   Zoster, Live 12/17/2010    Past Medical History:  Diagnosis Date   ADD (attention deficit disorder)    Rx-Adderall   Allergy    Anginal pain    Anxiety    on meds   Aortic atherosclerosis 05/27/2020   Seen on x-ray being treated with statin   Arthritis    AV block, 3rd degree (HCC)    Colon polyps 2011   tubular adenoma by excisional biopsy during colonoscopy   Complete heart block (HCC)    a. s/p PPM Placement in 12/2012 with Medtronic CRT-P placement in 03/2014   Complication of anesthesia    woke up fighting   Coronary artery disease    a. s/p PCI of the RCA in 2014 b. 08/2018: patent RCA stent with new mid-LAD 85% stenosis (treated with PCI/DES) and 85% distal OM2 stenosis (med management recommended)   Degenerative disc disease, cervical    Depression    PTSD   DM (diabetes mellitus) (HCC)    on meds   Dyspnea    with exertion   Gastrointestinal bleeding  10/15/2017   GERD (gastroesophageal reflux disease)    Heart murmur    Hyperlipidemia    on meds   Hypertension    on meds   Pacemaker    Palpitations 2003   Holter in 2003: PACs and PVCs; negative stress nuclear test in 2005; right bundle branch block; echo in 2008-mild LVH, otherwise normal; 2008-negative stress nuclear test.   Pneumonia    Prostatitis    prostate calcifications by CT   Seasonal allergies    Sleep apnea    questionable diagnosis    Tobacco History: Tobacco Use History[2] Counseling given: Not Answered   Outpatient Medications Prior to Visit  Medication Sig Dispense Refill   acetaminophen  (TYLENOL ) 500 MG tablet Take 1,500 mg by mouth daily as needed for moderate pain.     albuterol  (VENTOLIN  HFA) 108 (90 Base) MCG/ACT inhaler TAKE 2 PUFFS BY MOUTH EVERY 6 HOURS AS NEEDED FOR WHEEZE OR SHORTNESS OF BREATH 18  each 1   ALPRAZolam  (XANAX ) 0.5 MG tablet TAKE 1/2-1 TWICE DAILY AS NEEDED HOME USE ONLY USE SPARINGLY 6 tablet 1   amLODipine  (NORVASC ) 10 MG tablet Take 1 tablet (10 mg total) by mouth daily. 90 tablet 3   aspirin  EC 81 MG tablet Take 81 mg by mouth daily. Swallow whole.     budesonide -glycopyrrolate-formoterol (BREZTRI  AEROSPHERE) 160-9-4.8 MCG/ACT AERO inhaler Inhale 2 puffs into the lungs in the morning and at bedtime. (Patient not taking: Reported on 04/23/2024)     Empagliflozin-metFORMIN  HCl ER (SYNJARDY XR) 12.08-998 MG TB24 Take 1 tablet by mouth daily. (Patient taking differently: Take 1 tablet by mouth daily. 25mg  1/2 a day)     esomeprazole  (NEXIUM ) 20 MG capsule Take 20 mg by mouth daily.     HYDROmorphone  (DILAUDID ) 4 MG tablet 1 every 4 hours as needed pain caution drowsiness not for frequent use 15 tablet 0   isosorbide  mononitrate (IMDUR ) 120 MG 24 hr tablet TAKE 1 TABLET(120 MG) BY MOUTH DAILY 90 tablet 2   losartan  (COZAAR ) 100 MG tablet TAKE 1 TABLET BY MOUTH EVERY DAY (Patient taking differently: Take 50 mg by mouth daily. TAKE 1 TABLET BY  MOUTH EVERY DAY) 90 tablet 1   nitroGLYCERIN  (NITROSTAT ) 0.4 MG SL tablet Place 1 tablet (0.4 mg total) under the tongue every 5 (five) minutes x 3 doses as needed for chest pain (if no relief after 3rd dose, proceed to ED for an evaluation). 25 tablet 3   ondansetron  (ZOFRAN ) 8 MG tablet TAKE 1 TABLET (8MG ) BY MOUTH EVERY 8 HOURS AS NEEDED FOR NAUSEA 15 tablet 0   propranolol  ER (INDERAL  LA) 160 MG SR capsule Take 1 capsule (160 mg total) by mouth daily. 90 capsule 3   ranolazine  (RANEXA ) 500 MG 12 hr tablet Take 1 tablet (500 mg total) by mouth 2 (two) times daily. 60 tablet 6   rosuvastatin  (CRESTOR ) 10 MG tablet TAKE 1 TABLET BY MOUTH EVERY DAY (Patient taking differently: Take 20 mg by mouth daily.) 90 tablet 2   tamsulosin  (FLOMAX ) 0.4 MG CAPS capsule TAKE 2 CAPSULES BY MOUTH AT BEDTIME 180 capsule 1   tiZANidine  (ZANAFLEX ) 4 MG tablet 1 q 8 hours prn caution drowsiness home use only 24 tablet 1   Facility-Administered Medications Prior to Visit  Medication Dose Route Frequency Provider Last Rate Last Admin   sodium chloride  flush (NS) 0.9 % injection 3 mL  3 mL Intravenous Q12H Branch, Dorn FALCON, MD        Review of Systems  Review of Systems  Constitutional: Negative.   Respiratory: Negative.     Physical Exam  There were no vitals taken for this visit. Physical Exam Constitutional:      General: He is not in acute distress.    Appearance: Normal appearance. He is well-developed. He is not ill-appearing.  HENT:     Head: Normocephalic and atraumatic.     Mouth/Throat:     Mouth: Mucous membranes are moist.     Pharynx: Oropharynx is clear.  Cardiovascular:     Rate and Rhythm: Normal rate and regular rhythm.     Heart sounds: Normal heart sounds.  Pulmonary:     Effort: Pulmonary effort is normal. No respiratory distress.     Breath sounds: Normal breath sounds. No wheezing or rhonchi.  Musculoskeletal:        General: Normal range of motion.     Cervical back: Normal  range of motion and neck supple.  Skin:    General: Skin is warm and dry.     Findings: No erythema or rash.  Neurological:     General: No focal deficit present.     Mental Status: He is alert and oriented to person, place, and time. Mental status is at baseline.  Psychiatric:        Mood and Affect: Mood normal.        Behavior: Behavior normal.        Thought Content: Thought content normal.        Judgment: Judgment normal.      Lab Results:  CBC    Component Value Date/Time   WBC 6.5 02/14/2024 1353   RBC 5.21 02/14/2024 1353   HGB 15.4 02/14/2024 1353   HGB 15.1 01/09/2024 1112   HCT 46.4 02/14/2024 1353   HCT 47.0 01/09/2024 1112   PLT 197.0 02/14/2024 1353   PLT 174 01/09/2024 1112   MCV 88.9 02/14/2024 1353   MCV 94 01/09/2024 1112   MCH 30.1 01/09/2024 1112   MCH 29.9 04/27/2023 1210   MCHC 33.3 02/14/2024 1353   RDW 13.9 02/14/2024 1353   RDW 12.8 01/09/2024 1112   LYMPHSABS 1.6 02/14/2024 1353   LYMPHSABS 1.4 01/09/2024 1112   MONOABS 0.6 02/14/2024 1353   EOSABS 0.2 02/14/2024 1353   EOSABS 0.2 01/09/2024 1112   BASOSABS 0.0 02/14/2024 1353   BASOSABS 0.0 01/09/2024 1112    BMET    Component Value Date/Time   NA 140 01/09/2024 1112   K 4.5 01/09/2024 1112   CL 102 01/09/2024 1112   CO2 22 01/09/2024 1112   GLUCOSE 169 (H) 01/09/2024 1112   GLUCOSE 127 (H) 04/27/2023 1210   BUN 19 01/09/2024 1112   CREATININE 1.01 01/09/2024 1112   CREATININE 1.00 06/11/2020 0927   CALCIUM  9.9 01/09/2024 1112   GFRNONAA >60 04/27/2023 1210   GFRNONAA 79 06/11/2020 0927   GFRAA 91 06/11/2020 0927    BNP    Component Value Date/Time   BNP 68.9 02/22/2022 0941   BNP 50.3 02/24/2016 1131    ProBNP No results found for: PROBNP  Imaging: No results found.   Assessment & Plan:   1. Cough, unspecified type (Primary) - POCT EXHALED NITRIC OXIDE   2. Restrictive lung disease  Assessment and Plan Assessment & Plan Chronic cough with eosinophilic  airway inflammation Chronic cough with elevated FENO indicating eosinophilic airway inflammation. Cough is dry, severe, and occurs intermittently, sometimes leading to gagging. No postnasal drip reported. Current symptoms suggest eosinophilic inflammation driving the cough. - Prescribed Pulmicort  inhaler to use as needed twice daily. - Prescribed Tessalon  Perles (benzonatate ) for cough management, up to three times a day as needed. - Will consider long-acting bronchodilator plus ICS at follow-up if symptoms persist.  Restrictive lung disease due to mechanical factors Restrictive lung disease likely due to mechanical factors such as obesity, deconditioning, and elevated hemidiaphragm. Improvement in symptoms with weight loss from GLP-1 medication. No significant respiratory complaints currently, with minimal shortness of breath and improved exercise tolerance. - Continue monitoring respiratory symptoms and weight management. - Will follow up in 3-4 months or sooner if needed.  Recording duration: 12 minutes   Lab Results  Component Value Date   NITRICOXIDE 58 02/14/2024   FeNO level (ppb)  Date/Time Value Ref Range Status  05/13/2024 02:25 PM 54 <25 - >50 Final   This result suggests high (>/= 50) Type 2 (T2) airway inflammation. This supports inhaled corticosteroid responsiveness and potential eligibility  for biologic therapies targeting Type 2 inflammation.    Almarie LELON Ferrari, NP 05/13/2024     [1]  Allergies Allergen Reactions   Morphine  And Codeine Itching and Rash    redness   Lasix  [Furosemide ]     Chest pain and tightness   Latex Rash  [2]  Social History Tobacco Use  Smoking Status Former   Current packs/day: 0.00   Average packs/day: 2.5 packs/day for 40.0 years (99.9 ttl pk-yrs)   Types: Cigarettes   Start date: 04/18/1967   Quit date: 12/20/2003   Years since quitting: 20.4  Smokeless Tobacco Former   "

## 2024-05-15 ENCOUNTER — Other Ambulatory Visit: Payer: Self-pay | Admitting: Family Medicine

## 2024-05-20 ENCOUNTER — Other Ambulatory Visit (HOSPITAL_COMMUNITY): Payer: Self-pay

## 2024-05-20 ENCOUNTER — Telehealth: Payer: Self-pay

## 2024-05-20 NOTE — Telephone Encounter (Signed)
 Alternatives to Pulmicort  Flexhaler:   Arnuity Ellipta- $197.50 Asmanex Twisthaler- $111.84 Asmanex HFA- $111.89 Qvar Redihaler- [id number]  *patient has a deductible to meet causing higher prices at this time. Prices are calculated for 30 days via [facility]

## 2024-05-21 NOTE — Telephone Encounter (Signed)
 Please let patient know he has a deductible that hasn't been met yet which is causing higher prices at this time. The cheapest alternative is Asmanex, let us  know if he would like a prescription sent in.

## 2024-05-21 NOTE — Telephone Encounter (Signed)
 Called and spoke with the pt. He states he is not taking Pulmicort  inhaler because he doesn't think it is helping.  Pt states he does have albuterol  inhaler uses PRN.  Pt states he is 100% dependent on [facility] and if he needs anything he will go to them regarding his inhaler management.  Routing to [employer] as FYI that pt is not using Pulmicort  inhaler and would not like any of the alternatives.

## 2024-06-16 ENCOUNTER — Encounter: Admitting: Family Medicine

## 2024-08-11 ENCOUNTER — Ambulatory Visit: Admitting: Primary Care

## 2024-10-22 ENCOUNTER — Ambulatory Visit: Admitting: Family Medicine
